# Patient Record
Sex: Female | Born: 1970
Health system: Southern US, Community
[De-identification: ages and names within clinical notes are randomized; demographics above are authoritative.]

## PROBLEM LIST (undated history)

## (undated) DIAGNOSIS — IMO0002 Reserved for concepts with insufficient information to code with codable children: Secondary | ICD-10-CM

## (undated) DIAGNOSIS — M25571 Pain in right ankle and joints of right foot: Secondary | ICD-10-CM

## (undated) DIAGNOSIS — E559 Vitamin D deficiency, unspecified: Secondary | ICD-10-CM

## (undated) DIAGNOSIS — J45909 Unspecified asthma, uncomplicated: Secondary | ICD-10-CM

## (undated) DIAGNOSIS — E538 Deficiency of other specified B group vitamins: Secondary | ICD-10-CM

## (undated) DIAGNOSIS — R7303 Prediabetes: Secondary | ICD-10-CM

## (undated) DIAGNOSIS — R5383 Other fatigue: Secondary | ICD-10-CM

## (undated) DIAGNOSIS — R51 Headache: Secondary | ICD-10-CM

## (undated) DIAGNOSIS — E739 Lactose intolerance, unspecified: Secondary | ICD-10-CM

## (undated) DIAGNOSIS — J329 Chronic sinusitis, unspecified: Secondary | ICD-10-CM

## (undated) DIAGNOSIS — R0602 Shortness of breath: Secondary | ICD-10-CM

## (undated) DIAGNOSIS — K219 Gastro-esophageal reflux disease without esophagitis: Secondary | ICD-10-CM

## (undated) DIAGNOSIS — M6281 Muscle weakness (generalized): Secondary | ICD-10-CM

## (undated) DIAGNOSIS — E663 Overweight: Secondary | ICD-10-CM

## (undated) DIAGNOSIS — R600 Localized edema: Secondary | ICD-10-CM

## (undated) DIAGNOSIS — K3184 Gastroparesis: Secondary | ICD-10-CM

## (undated) DIAGNOSIS — H04123 Dry eye syndrome of bilateral lacrimal glands: Secondary | ICD-10-CM

## (undated) DIAGNOSIS — K5900 Constipation, unspecified: Secondary | ICD-10-CM

## (undated) DIAGNOSIS — R0989 Other specified symptoms and signs involving the circulatory and respiratory systems: Secondary | ICD-10-CM

## (undated) DIAGNOSIS — K760 Fatty (change of) liver, not elsewhere classified: Secondary | ICD-10-CM

## (undated) DIAGNOSIS — Z8711 Personal history of peptic ulcer disease: Secondary | ICD-10-CM

## (undated) DIAGNOSIS — L853 Xerosis cutis: Secondary | ICD-10-CM

## (undated) DIAGNOSIS — G43909 Migraine, unspecified, not intractable, without status migrainosus: Secondary | ICD-10-CM

## (undated) DIAGNOSIS — Z8719 Personal history of other diseases of the digestive system: Secondary | ICD-10-CM

## (undated) DIAGNOSIS — G43709 Chronic migraine without aura, not intractable, without status migrainosus: Secondary | ICD-10-CM

## (undated) DIAGNOSIS — M26629 Arthralgia of temporomandibular joint, unspecified side: Secondary | ICD-10-CM

## (undated) DIAGNOSIS — R12 Heartburn: Secondary | ICD-10-CM

## (undated) HISTORY — DX: Shortness of breath: R06.02

## (undated) HISTORY — PX: COLONOSCOPY: SHX174

## (undated) HISTORY — DX: Reserved for concepts with insufficient information to code with codable children: IMO0002

## (undated) HISTORY — DX: Migraine, unspecified, not intractable, without status migrainosus: G43.909

## (undated) HISTORY — DX: Chronic migraine without aura, not intractable, without status migrainosus: G43.709

## (undated) HISTORY — PX: OTHER SURGICAL HISTORY: SHX169

## (undated) HISTORY — DX: Gastro-esophageal reflux disease without esophagitis: K21.9

## (undated) HISTORY — DX: Constipation, unspecified: K59.00

## (undated) HISTORY — PX: UPPER GASTROINTESTINAL ENDOSCOPY: SHX188

## (undated) HISTORY — DX: Headache: R51

## (undated) HISTORY — DX: Personal history of peptic ulcer disease: Z87.11

## (undated) HISTORY — DX: Xerosis cutis: L85.3

## (undated) HISTORY — DX: Other specified symptoms and signs involving the circulatory and respiratory systems: R09.89

## (undated) HISTORY — DX: Personal history of other diseases of the digestive system: Z87.19

## (undated) HISTORY — DX: Deficiency of other specified B group vitamins: E53.8

## (undated) HISTORY — DX: Overweight: E66.3

## (undated) HISTORY — DX: Fatty (change of) liver, not elsewhere classified: K76.0

## (undated) HISTORY — DX: Arthralgia of temporomandibular joint, unspecified side: M26.629

## (undated) HISTORY — DX: Prediabetes: R73.03

## (undated) HISTORY — DX: Lactose intolerance, unspecified: E73.9

## (undated) HISTORY — DX: Other fatigue: R53.83

## (undated) HISTORY — DX: Chronic sinusitis, unspecified: J32.9

## (undated) HISTORY — DX: Unspecified asthma, uncomplicated: J45.909

## (undated) HISTORY — DX: Dry eye syndrome of bilateral lacrimal glands: H04.123

## (undated) HISTORY — DX: Heartburn: R12

## (undated) HISTORY — DX: Vitamin D deficiency, unspecified: E55.9

## (undated) HISTORY — PX: BUNIONECTOMY: SHX129

## (undated) HISTORY — PX: LAPAROSCOPIC PARTIAL HEPATECTOMY: SHX5909

---

## 1998-02-15 ENCOUNTER — Inpatient Hospital Stay (HOSPITAL_COMMUNITY): Admission: AD | Admit: 1998-02-15 | Discharge: 1998-02-15 | Payer: Self-pay | Admitting: Obstetrics & Gynecology

## 1999-03-11 ENCOUNTER — Encounter: Payer: Self-pay | Admitting: Family Medicine

## 1999-03-11 ENCOUNTER — Ambulatory Visit (HOSPITAL_COMMUNITY): Admission: RE | Admit: 1999-03-11 | Discharge: 1999-03-11 | Payer: Self-pay | Admitting: Family Medicine

## 1999-03-17 ENCOUNTER — Encounter: Admission: RE | Admit: 1999-03-17 | Discharge: 1999-03-17 | Payer: Self-pay | Admitting: Family Medicine

## 1999-08-15 ENCOUNTER — Encounter: Payer: Self-pay | Admitting: Family Medicine

## 1999-08-15 ENCOUNTER — Encounter: Admission: RE | Admit: 1999-08-15 | Discharge: 1999-08-15 | Payer: Self-pay | Admitting: Family Medicine

## 2000-03-07 ENCOUNTER — Inpatient Hospital Stay (HOSPITAL_COMMUNITY): Admission: AD | Admit: 2000-03-07 | Discharge: 2000-03-07 | Payer: Self-pay | Admitting: Obstetrics

## 2000-06-27 ENCOUNTER — Inpatient Hospital Stay (HOSPITAL_COMMUNITY): Admission: AD | Admit: 2000-06-27 | Discharge: 2000-06-27 | Payer: Self-pay | Admitting: *Deleted

## 2000-09-26 ENCOUNTER — Inpatient Hospital Stay (HOSPITAL_COMMUNITY): Admission: AD | Admit: 2000-09-26 | Discharge: 2000-09-26 | Payer: Self-pay | Admitting: Obstetrics

## 2000-09-27 ENCOUNTER — Encounter: Payer: Self-pay | Admitting: Obstetrics

## 2002-03-25 ENCOUNTER — Inpatient Hospital Stay (HOSPITAL_COMMUNITY): Admission: AD | Admit: 2002-03-25 | Discharge: 2002-03-25 | Payer: Self-pay | Admitting: *Deleted

## 2002-03-26 ENCOUNTER — Encounter: Payer: Self-pay | Admitting: *Deleted

## 2002-05-12 ENCOUNTER — Other Ambulatory Visit: Admission: RE | Admit: 2002-05-12 | Discharge: 2002-05-12 | Payer: Self-pay | Admitting: Obstetrics and Gynecology

## 2002-05-16 ENCOUNTER — Encounter: Payer: Self-pay | Admitting: Obstetrics and Gynecology

## 2002-05-17 ENCOUNTER — Encounter: Payer: Self-pay | Admitting: Obstetrics and Gynecology

## 2002-05-17 ENCOUNTER — Inpatient Hospital Stay (HOSPITAL_COMMUNITY): Admission: AD | Admit: 2002-05-17 | Discharge: 2002-05-19 | Payer: Self-pay | Admitting: Obstetrics and Gynecology

## 2002-05-21 ENCOUNTER — Encounter (INDEPENDENT_AMBULATORY_CARE_PROVIDER_SITE_OTHER): Payer: Self-pay | Admitting: Specialist

## 2002-05-21 ENCOUNTER — Inpatient Hospital Stay (HOSPITAL_COMMUNITY): Admission: RE | Admit: 2002-05-21 | Discharge: 2002-05-23 | Payer: Self-pay | Admitting: Obstetrics and Gynecology

## 2002-06-02 ENCOUNTER — Encounter: Payer: Self-pay | Admitting: Obstetrics and Gynecology

## 2002-06-03 ENCOUNTER — Inpatient Hospital Stay (HOSPITAL_COMMUNITY): Admission: AD | Admit: 2002-06-03 | Discharge: 2002-06-04 | Payer: Self-pay | Admitting: Obstetrics and Gynecology

## 2002-12-23 ENCOUNTER — Encounter: Payer: Self-pay | Admitting: Obstetrics and Gynecology

## 2002-12-23 ENCOUNTER — Ambulatory Visit (HOSPITAL_COMMUNITY): Admission: RE | Admit: 2002-12-23 | Discharge: 2002-12-23 | Payer: Self-pay | Admitting: Obstetrics and Gynecology

## 2003-11-11 ENCOUNTER — Encounter: Admission: RE | Admit: 2003-11-11 | Discharge: 2003-11-11 | Payer: Self-pay | Admitting: Internal Medicine

## 2003-11-26 ENCOUNTER — Encounter: Admission: RE | Admit: 2003-11-26 | Discharge: 2003-11-26 | Payer: Self-pay | Admitting: Family Medicine

## 2003-12-29 ENCOUNTER — Other Ambulatory Visit: Admission: RE | Admit: 2003-12-29 | Discharge: 2003-12-29 | Payer: Self-pay | Admitting: Obstetrics and Gynecology

## 2004-04-22 ENCOUNTER — Ambulatory Visit: Payer: Self-pay | Admitting: Family Medicine

## 2004-06-05 HISTORY — PX: BREAST ENHANCEMENT SURGERY: SHX7

## 2004-06-05 HISTORY — PX: AUGMENTATION MAMMAPLASTY: SUR837

## 2004-06-29 ENCOUNTER — Ambulatory Visit: Payer: Self-pay | Admitting: Family Medicine

## 2004-07-15 ENCOUNTER — Ambulatory Visit: Payer: Self-pay | Admitting: Family Medicine

## 2004-08-08 ENCOUNTER — Ambulatory Visit: Payer: Self-pay | Admitting: Family Medicine

## 2004-12-01 ENCOUNTER — Ambulatory Visit: Payer: Self-pay | Admitting: Internal Medicine

## 2005-01-04 ENCOUNTER — Other Ambulatory Visit: Admission: RE | Admit: 2005-01-04 | Discharge: 2005-01-04 | Payer: Self-pay | Admitting: Obstetrics and Gynecology

## 2005-01-05 ENCOUNTER — Ambulatory Visit: Payer: Self-pay | Admitting: Internal Medicine

## 2005-01-06 ENCOUNTER — Ambulatory Visit: Payer: Self-pay | Admitting: Cardiology

## 2005-02-24 ENCOUNTER — Ambulatory Visit: Payer: Self-pay | Admitting: Family Medicine

## 2005-05-09 ENCOUNTER — Ambulatory Visit: Payer: Self-pay | Admitting: Internal Medicine

## 2005-06-12 ENCOUNTER — Encounter: Admission: RE | Admit: 2005-06-12 | Discharge: 2005-06-12 | Payer: Self-pay | Admitting: Internal Medicine

## 2005-08-02 ENCOUNTER — Ambulatory Visit: Payer: Self-pay | Admitting: Internal Medicine

## 2006-01-24 ENCOUNTER — Ambulatory Visit: Payer: Self-pay | Admitting: Internal Medicine

## 2006-02-02 ENCOUNTER — Ambulatory Visit: Payer: Self-pay | Admitting: Internal Medicine

## 2006-03-06 ENCOUNTER — Ambulatory Visit: Payer: Self-pay | Admitting: Internal Medicine

## 2006-04-11 ENCOUNTER — Ambulatory Visit: Payer: Self-pay | Admitting: Family Medicine

## 2006-04-12 ENCOUNTER — Ambulatory Visit: Payer: Self-pay | Admitting: Family Medicine

## 2006-05-11 ENCOUNTER — Ambulatory Visit: Payer: Self-pay | Admitting: Internal Medicine

## 2006-06-21 ENCOUNTER — Encounter: Admission: RE | Admit: 2006-06-21 | Discharge: 2006-06-21 | Payer: Self-pay | Admitting: Internal Medicine

## 2006-10-03 ENCOUNTER — Ambulatory Visit: Payer: Self-pay | Admitting: Internal Medicine

## 2007-01-02 ENCOUNTER — Encounter: Payer: Self-pay | Admitting: Family Medicine

## 2007-02-12 ENCOUNTER — Ambulatory Visit: Payer: Self-pay | Admitting: Family Medicine

## 2007-04-17 ENCOUNTER — Ambulatory Visit: Payer: Self-pay | Admitting: Family Medicine

## 2007-05-14 ENCOUNTER — Ambulatory Visit: Payer: Self-pay | Admitting: Internal Medicine

## 2007-06-04 ENCOUNTER — Telehealth (INDEPENDENT_AMBULATORY_CARE_PROVIDER_SITE_OTHER): Payer: Self-pay | Admitting: *Deleted

## 2007-06-07 ENCOUNTER — Ambulatory Visit: Payer: Self-pay | Admitting: Internal Medicine

## 2007-07-08 ENCOUNTER — Encounter: Payer: Self-pay | Admitting: Internal Medicine

## 2007-07-08 ENCOUNTER — Other Ambulatory Visit: Admission: RE | Admit: 2007-07-08 | Discharge: 2007-07-08 | Payer: Self-pay | Admitting: Internal Medicine

## 2007-07-08 ENCOUNTER — Ambulatory Visit: Payer: Self-pay | Admitting: Internal Medicine

## 2007-07-08 LAB — CONVERTED CEMR LAB
Glucose, Urine, Semiquant: NEGATIVE
Nitrite: NEGATIVE
Urobilinogen, UA: NEGATIVE
pH: 6

## 2007-07-09 ENCOUNTER — Encounter: Payer: Self-pay | Admitting: Internal Medicine

## 2007-07-11 LAB — CONVERTED CEMR LAB
AST: 23 units/L (ref 0–37)
BUN: 17 mg/dL (ref 6–23)
Basophils Absolute: 0 10*3/uL (ref 0.0–0.1)
Creatinine, Ser: 0.9 mg/dL (ref 0.4–1.2)
Eosinophils Absolute: 0.1 10*3/uL (ref 0.0–0.6)
GFR calc Af Amer: 90 mL/min
GFR calc non Af Amer: 74 mL/min
Glucose, Bld: 86 mg/dL (ref 70–99)
HCT: 37.2 % (ref 36.0–46.0)
Hemoglobin: 12.2 g/dL (ref 12.0–15.0)
Lymphocytes Relative: 33.3 % (ref 12.0–46.0)
MCV: 90.6 fL (ref 78.0–100.0)
Monocytes Relative: 5.3 % (ref 3.0–11.0)
Potassium: 4.1 meq/L (ref 3.5–5.1)
RBC: 4.1 M/uL (ref 3.87–5.11)
RDW: 12.5 % (ref 11.5–14.6)
TSH: 0.79 microintl units/mL (ref 0.35–5.50)
WBC: 6.1 10*3/uL (ref 4.5–10.5)

## 2007-07-15 ENCOUNTER — Encounter (INDEPENDENT_AMBULATORY_CARE_PROVIDER_SITE_OTHER): Payer: Self-pay | Admitting: *Deleted

## 2007-12-09 ENCOUNTER — Encounter (INDEPENDENT_AMBULATORY_CARE_PROVIDER_SITE_OTHER): Payer: Self-pay | Admitting: *Deleted

## 2007-12-16 ENCOUNTER — Ambulatory Visit: Payer: Self-pay | Admitting: Internal Medicine

## 2007-12-16 DIAGNOSIS — R519 Headache, unspecified: Secondary | ICD-10-CM | POA: Insufficient documentation

## 2007-12-16 DIAGNOSIS — R51 Headache: Secondary | ICD-10-CM | POA: Insufficient documentation

## 2007-12-16 DIAGNOSIS — M26629 Arthralgia of temporomandibular joint, unspecified side: Secondary | ICD-10-CM | POA: Insufficient documentation

## 2008-08-31 ENCOUNTER — Ambulatory Visit: Payer: Self-pay | Admitting: Family Medicine

## 2008-09-11 ENCOUNTER — Telehealth (INDEPENDENT_AMBULATORY_CARE_PROVIDER_SITE_OTHER): Payer: Self-pay | Admitting: *Deleted

## 2008-09-21 ENCOUNTER — Telehealth (INDEPENDENT_AMBULATORY_CARE_PROVIDER_SITE_OTHER): Payer: Self-pay | Admitting: *Deleted

## 2008-11-23 ENCOUNTER — Encounter: Admission: RE | Admit: 2008-11-23 | Discharge: 2008-11-23 | Payer: Self-pay | Admitting: Obstetrics and Gynecology

## 2009-06-18 ENCOUNTER — Telehealth (INDEPENDENT_AMBULATORY_CARE_PROVIDER_SITE_OTHER): Payer: Self-pay | Admitting: *Deleted

## 2009-06-25 ENCOUNTER — Encounter (INDEPENDENT_AMBULATORY_CARE_PROVIDER_SITE_OTHER): Payer: Self-pay | Admitting: *Deleted

## 2009-06-25 ENCOUNTER — Ambulatory Visit: Payer: Self-pay | Admitting: Internal Medicine

## 2009-06-26 ENCOUNTER — Emergency Department (HOSPITAL_BASED_OUTPATIENT_CLINIC_OR_DEPARTMENT_OTHER): Admission: EM | Admit: 2009-06-26 | Discharge: 2009-06-26 | Payer: Self-pay | Admitting: Emergency Medicine

## 2009-06-26 ENCOUNTER — Ambulatory Visit: Payer: Self-pay | Admitting: Diagnostic Radiology

## 2009-07-14 ENCOUNTER — Ambulatory Visit (HOSPITAL_BASED_OUTPATIENT_CLINIC_OR_DEPARTMENT_OTHER): Admission: RE | Admit: 2009-07-14 | Discharge: 2009-07-14 | Payer: Self-pay | Admitting: Orthopedic Surgery

## 2009-07-14 ENCOUNTER — Ambulatory Visit: Payer: Self-pay | Admitting: Diagnostic Radiology

## 2009-08-10 ENCOUNTER — Encounter: Payer: Self-pay | Admitting: Internal Medicine

## 2009-08-10 ENCOUNTER — Encounter: Admission: RE | Admit: 2009-08-10 | Discharge: 2009-08-31 | Payer: Self-pay | Admitting: Orthopedic Surgery

## 2009-08-16 ENCOUNTER — Ambulatory Visit: Payer: Self-pay | Admitting: Internal Medicine

## 2009-10-25 ENCOUNTER — Ambulatory Visit: Payer: Self-pay | Admitting: Family Medicine

## 2009-11-04 ENCOUNTER — Telehealth (INDEPENDENT_AMBULATORY_CARE_PROVIDER_SITE_OTHER): Payer: Self-pay | Admitting: *Deleted

## 2009-11-09 ENCOUNTER — Ambulatory Visit: Payer: Self-pay | Admitting: Family Medicine

## 2009-12-09 ENCOUNTER — Encounter: Admission: RE | Admit: 2009-12-09 | Discharge: 2009-12-09 | Payer: Self-pay | Admitting: Obstetrics and Gynecology

## 2009-12-28 ENCOUNTER — Encounter: Admission: RE | Admit: 2009-12-28 | Discharge: 2010-02-09 | Payer: Self-pay | Admitting: Orthopedic Surgery

## 2010-01-12 ENCOUNTER — Encounter: Admission: RE | Admit: 2010-01-12 | Discharge: 2010-01-12 | Payer: Self-pay | Admitting: Obstetrics and Gynecology

## 2010-03-14 ENCOUNTER — Ambulatory Visit: Payer: Self-pay | Admitting: Internal Medicine

## 2010-03-18 ENCOUNTER — Ambulatory Visit: Payer: Self-pay | Admitting: Cardiology

## 2010-06-26 ENCOUNTER — Encounter: Payer: Self-pay | Admitting: Obstetrics and Gynecology

## 2010-07-05 NOTE — Assessment & Plan Note (Signed)
Summary: sore throat./cbs   Vital Signs:  Patient profile:   40 year old female Weight:      150 pounds Temp:     98.9 degrees F oral BP sitting:   110 / 72  (left arm)  Vitals Entered By: [name] (Oct 25, 2009 1:46 PM) CC: sore throat along w/ nausea and HA   History of Present Illness: 40 yo woman here today for sore throat.  sxs started w/ nausea 3-4 days ago.  sore throat for last 48 hrs.  also having facial pressure and R ear pain.  taking Zyrtec w/out relief.  temp to 100.  + sick contacts.  cough started last night- 'like a dog', barking.  + nasal congestion.  Allergies (verified): 1)  ! Tylox  Review of Systems      See HPI  Physical Exam  General:  alert and well-developed.   Head:  Normocephalic and atraumatic without obvious abnormalities. No apparent alopecia or balding.  mild TTP over R frontal and maxillary sinus Eyes:  no injxn or inflammation Ears:  R TM retracted, R TM WNL Nose:  marked turbinate edema Mouth:  + post nasal drip Neck:  No deformities, masses, or tenderness noted. Lungs:  normal respiratory effort, no intercostal retractions, no accessory muscle use, and normal breath sounds.  + dry cough Heart:  normal rate, regular rhythm, and no murmur.     Impression & Recommendations:  Problem # 1:  SINUSITIS- ACUTE-NOS (ICD-461.9) Assessment Unchanged start amox.  reviewed supportive care and red flags that should prompt return.  Pt expresses understanding and is in agreement w/ this plan. The following medications were removed from the medication list:    Fluticasone Propionate 50 Mcg/act Susp (Fluticasone propionate) .Marland Kitchen... 2 sprays each nostril once daily    Guaifenesin Ac 100-10 Mg/64ml Syrp (Guaifenesin-codeine) .Marland Kitchen... 1-2 tsps q4-6 as needed for cough.  disp Her updated medication list for this problem includes:    Amoxicillin 500 Mg Tabs (Amoxicillin) .Marland Kitchen... 2 tabs by mouth two times a day x10 days  Complete Medication List: 1)   Nuvaring 0.12-0.015 Mg/24hr Ring (Etonogestrel-ethinyl estradiol) .... Qd 2)  Amoxicillin 500 Mg Tabs (Amoxicillin) .... 2 tabs by mouth two times a day x10 days 3)  Diflucan 150 Mg Tabs (Fluconazole) .... Once daily for yeast.  repeat in 72 hours or at end of abx if symptoms persist  Patient Instructions: 1)  This appears to be a sinus infection- take the amoxicillin as directed 2)  Use the diflucan for yeast as needed 3)  Continue your Zyrtec for the allergy component 4)  Tylenol/Ibuprofen as needed for pain or fever 5)  Call with any questions or concerns 6)  Hang in there! Prescriptions: DIFLUCAN 150 MG TABS (FLUCONAZOLE) once daily for yeast.  repeat in 72 hours or at end of abx if symptoms persist  #2 x 0   Entered and Authorized by:   Neena Rhymes MD   Signed by:   Neena Rhymes MD on 10/25/2009   Method used:   Electronically to        [facility] #[id number]* (retail)       [address] [place], [place]  [address]       Ph: [phone]       Fax: [phone]   RxID:   [id number] AMOXICILLIN 500 MG TABS (AMOXICILLIN) 2 tabs by mouth two times a day x10 days  #40 x 0  Entered and Authorized by:   Neena Rhymes MD   Signed by:   Neena Rhymes MD on 10/25/2009   Method used:   Electronically to        [facility] #[id number]* (retail)       [address] [place], [place]  [address]       Ph: [phone]       Fax: [phone]   RxID:   [id number]

## 2010-07-05 NOTE — Assessment & Plan Note (Signed)
Summary: sore throat,cough/cbs   Vital Signs:  Patient profile:   40 year old female Weight:      150.25 pounds Temp:     98.9 degrees F oral Pulse rate:   64 / minute BP sitting:   100 / 68  Vitals Entered By: Kandice Hams (November 09, 2009 11:51 AM) CC: c/o sore throat, sinus congestion right  ear stopped up, cough brown phlegm   History of Present Illness: 40 yo woman here today w/ cough and congestion.  pt recently found out there is mold in the house.  completed course of amox but feels worse.  feels pressure in frontal sinuses, R ear pain, cough is productive of brown phlegm.  'i've never taken antibiotics and not gotten better'.  Allergies (verified): 1)  ! Tylox  Review of Systems      See HPI  Physical Exam  General:  alert and well-developed.   Head:  Normocephalic and atraumatic without obvious abnormalities. No apparent alopecia or balding.  TTP over frontal sinuses Eyes:  no injxn or inflammation Ears:  R TM retracted, R TM WNL Nose:  marked turbinate edema Mouth:  + post nasal drip Neck:  No deformities, masses, or tenderness noted. Lungs:  normal respiratory effort, no intercostal retractions, no accessory muscle use, and normal breath sounds.  + dry cough Heart:  normal rate, regular rhythm, and no murmur.   Cervical Nodes:  No lymphadenopathy noted   Impression & Recommendations:  Problem # 1:  SINUSITIS- ACUTE-NOS (ICD-461.9) Assessment Unchanged pt did not improve w/ course of amox.  start Avelox.  discussed importance of removing herself from mold exposure which is likely trigger.  reviewed supportive care and red flags that should prompt return.  Pt expresses understanding and is in agreement w/ this plan. Her updated medication list for this problem includes:    Avelox 400 Mg Tabs (Moxifloxacin hcl) .Marland Kitchen... 1 tablet by mouth daily    Nasonex 50 Mcg/act Susp (Mometasone furoate) .Marland Kitchen... 2 sprays each nostril once daily  Complete Medication List: 1)   Nuvaring 0.12-0.015 Mg/24hr Ring (Etonogestrel-ethinyl estradiol) .... Qd 2)  Diflucan 150 Mg Tabs (Fluconazole) .... Once daily for yeast.  repeat in 72 hours or at end of abx if symptoms persist 3)  Avelox 400 Mg Tabs (Moxifloxacin hcl) .Marland Kitchen.. 1 tablet by mouth daily 4)  Nasonex 50 Mcg/act Susp (Mometasone furoate) .... 2 sprays each nostril once daily  Patient Instructions: 1)  Take the Avelox as directed- take w/ food to avoid upset stomach 2)  Use the Nasonex daily to decrease congestion and inflammation 3)  Mucinex (over the counter) to thin your chest and nasal congestion- making it easier to cough up 4)  Drink plenty of fluids 5)  Call with any questions or concerns 6)  Hang in there!! Prescriptions: NASONEX 50 MCG/ACT SUSP (MOMETASONE FUROATE) 2 sprays each nostril once daily  #1 x 3   Entered and Authorized by:   Neena Rhymes MD   Signed by:   Neena Rhymes MD on 11/09/2009   Method used:   Electronically to        [id number]* (retail)       [address] [place], [place]  [address]       Ph: [phone]       Fax: [phone]   RxID:   [id number] AVELOX 400 MG  TABS (MOXIFLOXACIN HCL) 1 tablet by mouth daily  #10 x  0   Entered and Authorized by:   Neena Rhymes MD   Signed by:   Neena Rhymes MD on 11/09/2009   Method used:   Electronically to        [id number]* (retail)       [address] [place], [place]  [address]       Ph: [phone]       Fax: [phone]   RxID:   [id number]

## 2010-07-05 NOTE — Letter (Signed)
Summary: [facility]  [facility]   Imported By: Lanelle Bal 09/10/2009 10:44:44  _____________________________________________________________________  External Attachment:    Type:   Image     Comment:   External Document

## 2010-07-05 NOTE — Letter (Signed)
Summary: [place] No Show Letter  [facility] [place]  [address] [place], [place] [address]   Phone: [phone]  Fax: [phone]    06/25/2009 MRN: [id number]  [facility] [address] [place], [place]  [address]   Dear Ms. Fiallos,   Our records indicate that you missed your scheduled appointment with Dr. Drue Novel on 06/25/09.  Please contact this office to reschedule your appointment as soon as possible.  It is important that you keep your scheduled appointments with your physician, so we can provide you the best care possible.  Please be advised that there may be a charge for "no show" appointments.    Sincerely,   [place] [facility]

## 2010-07-05 NOTE — Progress Notes (Signed)
Summary: cannot leave msg mailbox full//left msg to call 3x  Phone Note Call from Patient Call back at Home Phone [phone]   Caller: Patient Summary of Call: pt called and left msg for a call back has a question about bacteria. Called pt back got VM pt cannot accept messages at this time mailbox is full try later .Kandice Hams  June 18, 2009 3:17 PM   Follow-up for Phone Call        Called pt got VM left msg for pt to call .Kandice Hams  June 21, 2009 11:03 AM  Follow-up by: Kandice Hams,  June 21, 2009 11:03 AM  Additional Follow-up for Phone Call Additional follow up Details #1::        pt called again. please return call. Called pt got VM left msg to call .Kandice Hams  June 30, 2009 11:18 AM  Additional Follow-up by: Warnell Forester,  June 21, 2009 1:14 PM    Additional Follow-up for Phone Call Additional follow up Details #2::    Called pt again today no answer chart back to file .Kandice Hams  July 06, 2009 10:48 AM  Follow-up by: Kandice Hams,  July 06, 2009 10:48 AM

## 2010-07-05 NOTE — Assessment & Plan Note (Signed)
Summary: FOLLOWUP FROM SINUS INF--NOT ANT BETTER///SPH   Vital Signs:  Patient profile:   40 year old female Weight:      160.50 pounds Temp:     98.9 degrees F Pulse rate:   87 / minute Pulse rhythm:   regular BP sitting:   122 / 80  (left arm) Cuff size:   large  Vitals Entered By: Army Fossa CMA (March 14, 2010 11:51 AM) CC: Pt here c/o sinus infection- exposed to mole.  Comments Has been on Avelox.    History of Present Illness: she was seen 5/11 and the 6-11 with sinus symptoms Status post amoxicillin and Avelox Symptoms initially improve but never went away completely Currently having frontal headaches, nasal congestion, occasional dizziness, some nausea. She moved out of a  house with mold 2 months ago yet symptoms persist she's not taking Nasonex as prescribed, she takes Zyrtec and Mucinex as needed which actually helps  ROS Dry cough, mild Occasional wheezing She does have postnasal dripping, itchy eyes and itchy nose. No fever Some ear discomfort   Current Medications (verified): 1)  Nuvaring 0.12-0.015 Mg/24hr  Ring (Etonogestrel-Ethinyl Estradiol) .... Qd 2)  Nasonex 50 Mcg/act Susp (Mometasone Furoate) .... 2 Sprays Each Nostril Once Daily  Allergies (verified): 1)  ! Tylox  Past History:  Past Medical History: Reviewed history from 12/16/2007 and no changes required. h/o endometriosis h/o occ HAs Occ TMJ pain Headache--Dx w/mugraines before, topamax helps  Past Surgical History: Reviewed history from 07/08/2007 and no changes required. Left fallopian tube removed Breast implant (aprox 2006)  Social History: Single household-- sister and 3 kids  no children  no pets   Physical Exam  General:  alert, well-developed, and well-nourished.   Head:  face symmetric, slightly tender at the frontal sinuses bilaterally Eyes:  no redness or discharge Ears:  R ear normal and L ear normal.   Nose:  slightly congested Mouth:  no redness, no  discharge, tonsils normal in size Lungs:  normal respiratory effort, no intercostal retractions, no accessory muscle use, and normal breath sounds.     Impression & Recommendations:  Problem # 1:  RHINITIS (ICD-477.9) persistent rhinitis symptoms despite moving away from a house with mold 2 months ago Plan: CT sinuses Zyrtec and Nasonex daily. Mucinex as needed Reassess and return to the office, patient knows to call me sooner if symptoms do not improve. She will need an allergist or ENT referral  (although states that she was seen by an allergist 2   years ago approximately) Her updated medication list for this problem includes:    Nasonex 50 Mcg/act Susp (Mometasone furoate) .Marland Kitchen... 2 sprays each nostril once daily    Zyrtec Allergy 10 Mg Caps (Cetirizine hcl) .Marland Kitchen... 1 a day  Complete Medication List: 1)  Nuvaring 0.12-0.015 Mg/24hr Ring (Etonogestrel-ethinyl estradiol) .... Qd 2)  Nasonex 50 Mcg/act Susp (Mometasone furoate) .... 2 sprays each nostril once daily 3)  Zyrtec Allergy 10 Mg Caps (Cetirizine hcl) .Marland Kitchen.. 1 a day  Other Orders: Radiology Referral (Radiology)  Patient Instructions: 1)  continue zyrtec daily, not as needed  2)  start nasonex 2 sprays on each side of the nose daily (x months) 3)  mucinex as needed 4)  call if no better in 3 weeks  5)  Please schedule a follow-up appointment in 3 months .  Prescriptions: NASONEX 50 MCG/ACT SUSP (MOMETASONE FUROATE) 2 sprays each nostril once daily  #1 x 6   Entered and Authorized by:  Custer Pimenta E. Cliffie Gingras MD   Signed by:   Nolon Rod. Kaimana Neuzil MD on 03/14/2010   Method used:   Print then Give to Patient   RxID:   [id number]

## 2010-07-05 NOTE — Progress Notes (Signed)
Summary: RE SINUS INF; MOLD IN HOUSE  Phone Note Call from Patient Call back at [phone]   Caller: Patient Summary of Call: pt has mold in her house, 3 other people has sinus infections also.  PT WANTS TO KNOW CAN SHE HAVE DOCUMENTATION OF HER SINUS INFECTION,   SEEN 10/26/09 INS CO FOR THE HOUSE SHE IS LEASING HAS GIVEN LANDLORD MONEY TO FIX AND HE HAS NOT DONE IT YET.  Initial call taken by: Kandice Hams,  November 04, 2009 3:08 PM  Follow-up for Phone Call        ok to print copy of pt's note Follow-up by: Neena Rhymes MD,  November 04, 2009 3:10 PM  Additional Follow-up for Phone Call Additional follow up Details #1::        OV NOTE PRINTED PT INFORMED UP FRONT FOR PICKUP .Kandice Hams  November 04, 2009 3:14 PM  Additional Follow-up by: Kandice Hams,  November 04, 2009 3:15 PM

## 2010-08-02 ENCOUNTER — Ambulatory Visit (INDEPENDENT_AMBULATORY_CARE_PROVIDER_SITE_OTHER): Payer: BC Managed Care – PPO | Admitting: Internal Medicine

## 2010-08-02 ENCOUNTER — Encounter: Payer: Self-pay | Admitting: Internal Medicine

## 2010-08-02 DIAGNOSIS — J209 Acute bronchitis, unspecified: Secondary | ICD-10-CM

## 2010-08-02 DIAGNOSIS — J019 Acute sinusitis, unspecified: Secondary | ICD-10-CM | POA: Insufficient documentation

## 2010-08-11 NOTE — Assessment & Plan Note (Signed)
Summary: congested/cbs   Vital Signs:  Patient profile:   40 year old female Weight:      152 pounds Temp:     98.6 degrees F oral Pulse rate:   84 / minute Resp:     15 per minute BP sitting:   110 / 68  (left arm) Cuff size:   large  Vitals Entered By: Shonna Chock CMA (August 02, 2010 3:35 PM) CC: Sinus Infection and nauseated (due to drainage) x 3-4 days , URI symptoms   CC:  Sinus Infection and nauseated (due to drainage) x 3-4 days  and URI symptoms.  History of Present Illness:    Onset as head congestion followed by ST from [place] 4 days ago. She now  reports scant  purulent nasal discharge and productive cough, but denies earache.  The patient denies fever, dyspnea, and wheezing.  The patient also reports frontal headache, bilateral facial pain and tooth pain.  The patient denies the following risk factors for Strep sinusitis: tender adenopathy.  Rx: Zyrtec , Mucinex D  Current Medications (verified): 1)  Nuvaring 0.12-0.015 Mg/24hr  Ring (Etonogestrel-Ethinyl Estradiol) .... Qd 2)  Nasonex 50 Mcg/act Susp (Mometasone Furoate) .... 2 Sprays Each Nostril Once Daily 3)  Zyrtec Allergy 10 Mg Caps (Cetirizine Hcl) .Marland Kitchen.. 1 A Day  Allergies: 1)  ! Tylox  Physical Exam  General:  well-nourished,in no acute distress; alert,appropriate and cooperative throughout examination Ears:  External ear exam shows no significant lesions or deformities.  Otoscopic examination reveals clear canals, tympanic membranes are intact bilaterally without bulging, retraction, inflammation or discharge. Hearing is grossly normal bilaterally. Nose:  External nasal examination shows no deformity or inflammation. Nasal mucosa are  dry  without lesions or exudates. Mouth:  Oral mucosa and oropharynx without lesions or exudates.  Teeth in good repair. Mild pharyngeal erythema.   Lungs:  Normal respiratory effort, chest expands symmetrically. Lungs are clear to auscultation, no crackles or  wheezes. Heart:  Normal rate and regular rhythm. S1 and S2 normal without gallop, murmur, click, rub .S4 Cervical Nodes:  Shotty lymphadenopathy noted Axillary Nodes:  No palpable lymphadenopathy   Impression & Recommendations:  Problem # 1:  SINUSITIS- ACUTE-NOS (ICD-461.9)  The following medications were removed from the medication list:    Nasonex 50 Mcg/act Susp (Mometasone furoate) .Marland Kitchen... 2 sprays each nostril once daily Her updated medication list for this problem includes:    Amoxicillin 500 Mg Caps (Amoxicillin) .Marland Kitchen... 1 three times a day (may affect birth control ! )    Fluticasone Propionate 50 Mcg/act Susp (Fluticasone propionate) .Marland Kitchen... 1 spray two times a day as needed  Problem # 2:  BRONCHITIS-ACUTE (ICD-466.0)  Her updated medication list for this problem includes:    Amoxicillin 500 Mg Caps (Amoxicillin) .Marland Kitchen... 1 three times a day (may affect birth control ! )  Complete Medication List: 1)  Nuvaring 0.12-0.015 Mg/24hr Ring (Etonogestrel-ethinyl estradiol) .... Qd 2)  Zyrtec Allergy 10 Mg Caps (Cetirizine hcl) .Marland Kitchen.. 1 a day 3)  Amoxicillin 500 Mg Caps (Amoxicillin) .Marland Kitchen.. 1 three times a day (may affect birth control ! ) 4)  Fluticasone Propionate 50 Mcg/act Susp (Fluticasone propionate) .Marland Kitchen.. 1 spray two times a day as needed 5)  Fluconazole 150 Mg Tabs (Fluconazole) .Marland Kitchen.. 1 once daily as needed  Patient Instructions: 1)  nETRI POT once daily- two times a day as needed . 2)  Drink as much non DAIRY fluid as you can tolerate for the next few days.sTOP DECONGESTANT. Prescriptions: FLUCONAZOLE  150 MG TABS (FLUCONAZOLE) 1 once daily as needed  #1 x 0   Entered and Authorized by:   Marga Melnick MD   Signed by:   Marga Melnick MD on 08/02/2010   Method used:   Print then Give to Patient   RxID:   [id number] FLUTICASONE PROPIONATE 50 MCG/ACT SUSP (FLUTICASONE PROPIONATE) 1 spray two times a day as needed  #1 x 11   Entered and Authorized by:   Marga Melnick MD    Signed by:   Marga Melnick MD on 08/02/2010   Method used:   Electronically to        [facility] #[id number]* (retail)       [address] [place], [place]  [address]       Ph: [phone]       Fax: [phone]   RxID:   [id number] AMOXICILLIN 500 MG CAPS (AMOXICILLIN) 1 three times a day (MAY affect birth control ! )  #30 x 0   Entered and Authorized by:   Marga Melnick MD   Signed by:   Marga Melnick MD on 08/02/2010   Method used:   Electronically to        [facility] #[id number]* (retail)       [address] [place], [place]  [address]       Ph: [phone]       Fax: [phone]   RxID:   [id number]    Orders Added: 1)  Est. Patient Level III [83151]

## 2010-08-23 ENCOUNTER — Encounter: Payer: Self-pay | Admitting: Internal Medicine

## 2010-08-24 ENCOUNTER — Ambulatory Visit: Payer: BC Managed Care – PPO | Admitting: Internal Medicine

## 2010-08-25 ENCOUNTER — Ambulatory Visit (INDEPENDENT_AMBULATORY_CARE_PROVIDER_SITE_OTHER): Payer: BC Managed Care – PPO | Admitting: Internal Medicine

## 2010-08-25 ENCOUNTER — Encounter: Payer: Self-pay | Admitting: Internal Medicine

## 2010-08-25 VITALS — BP 110/64 | HR 64 | Temp 98.2°F | Wt 151.6 lb

## 2010-08-25 DIAGNOSIS — J329 Chronic sinusitis, unspecified: Secondary | ICD-10-CM

## 2010-08-25 DIAGNOSIS — J209 Acute bronchitis, unspecified: Secondary | ICD-10-CM

## 2010-08-25 MED ORDER — CEFUROXIME AXETIL 500 MG PO TABS: 500.0000 mg | ORAL_TABLET | Freq: Two times a day (BID) | ORAL | Status: AC

## 2010-08-25 NOTE — Progress Notes (Signed)
  Subjective:    Patient ID: Morgan Long, female    DOB: May 15, 1971, 40 y.o.   MRN: [id number]  HPI she describes persistent congestion both nasally and in her chest. This is despite using the nasal rinse , fluticasone nasal spray , and Zyrtec.   she denies pain in the ears or discharge from the ears but she has had some pressure symptoms suggesting eustachian tube dysfunction. She has had frontal headache and facial pain. Additionally she's had pain in the left upper posterior teeth. She denies significant sore throat or tender lymphadenopathy.    itchy eyes and sneezing are not significant components.   She does describe purulent secretions both from the nose as well as the chest , more for the chest in reference to volume.    Review of Systems     Objective:   Physical Exam on exam she is in no acute distress.  There is full extraocular motion and no clinical conjunctivitis is suggested. There is mild edema of the nares more so on the right. The oropharynx is unremarkable. She does not have lymphadenopathy about the head neck or axilla. Her chest is surprisingly clear without rhonchi or wheezes.        Assessment & Plan:   #1 rhinosinusitis is suggested      #2 bronchitis with some purulence.   Plan : #1 a broader spectrum antibiotic will be prescribed ; she may have had a resistant organism which the initial metabolic did not eradicate. If symptoms persist then CT of the sinuses would be recommended.

## 2010-08-25 NOTE — Patient Instructions (Signed)
Continue the Neti  pot twice a day as needed for nasal congestion. Use the fluticasone nasal spray every 12 hours as needed for inflammation. Drink as much nondairy fluids as she can for the next several days. Align  is a probiotic which will help  prevent associated superinfections

## 2010-10-01 ENCOUNTER — Ambulatory Visit (INDEPENDENT_AMBULATORY_CARE_PROVIDER_SITE_OTHER): Payer: BC Managed Care – PPO | Admitting: Family Medicine

## 2010-10-01 ENCOUNTER — Encounter: Payer: Self-pay | Admitting: Family Medicine

## 2010-10-01 ENCOUNTER — Telehealth: Payer: Self-pay | Admitting: *Deleted

## 2010-10-01 VITALS — BP 98/64 | HR 86 | Temp 98.8°F | Wt 155.0 lb

## 2010-10-01 DIAGNOSIS — J309 Allergic rhinitis, unspecified: Secondary | ICD-10-CM

## 2010-10-01 MED ORDER — PREDNISONE 20 MG PO TABS: ORAL_TABLET | ORAL | Status: DC

## 2010-10-01 NOTE — Patient Instructions (Signed)
Take pred. As directed   Rt prn

## 2010-10-01 NOTE — Telephone Encounter (Signed)
Updated meds

## 2010-10-01 NOTE — Progress Notes (Signed)
  Subjective:    Patient ID: Morgan Long, female    DOB: Jul 03, 1970, 40 y.o.   MRN: [id number]  Sore Throat  Associated symptoms include coughing.  40 y/o fem nonsmoker runner who has been out in the woods running a lot and has had a flare of her AR unresp. To otc ATH    Review of Systems  Constitutional: Negative.   HENT: Positive for sneezing and postnasal drip.   Eyes: Negative.   Respiratory: Positive for cough.        Objective:   Physical Exam  Constitutional: She appears well-developed and well-nourished. No distress.  HENT:  Head: Normocephalic and atraumatic.  Right Ear: External ear normal.  Left Ear: External ear normal.  Nose: Nose normal.  Mouth/Throat: Oropharynx is clear and moist. No oropharyngeal exudate.  Neck: Normal range of motion.  Pulmonary/Chest: Effort normal and breath sounds normal. No respiratory distress. She has no wheezes. She has no rales. She exhibits no tenderness.  Skin: She is not diaphoretic.          Assessment & Plan:  AR,,pred. B/t

## 2010-10-04 ENCOUNTER — Telehealth: Payer: Self-pay | Admitting: Internal Medicine

## 2010-10-04 NOTE — Telephone Encounter (Signed)
Spoke w/ pt informed that we can't agree or disagree with plan from sat clinic Dr. Informed that if she is no better that she will need to be access by Dr. Drue Novel. Wanted to know if he would just call her in an atb informed that ov will be needed. Pt stated that she didn't want to pay another co-pay recommended she use netti pot which she has been using along w/ mucinex for congestion pt ok'd information.

## 2010-10-04 NOTE — Telephone Encounter (Signed)
Patient called to check with Dr Drue Novel to make sure Dr Drue Novel agrees with treatment plan from Saturday clinic doctor---Doctor on Saturday clinic said patient had allergies and put her on Prednisone--patient is concerned because she is coughing up yellow phlegm and that usually means she has a sinus infection---just wants to know that Dr Drue Novel agrees

## 2010-10-04 NOTE — Telephone Encounter (Signed)
Left msg for pt to return call.

## 2010-10-21 NOTE — Discharge Summary (Signed)
   NAME:  Morgan Long, Morgan Long                         ACCOUNT NO.:  [id number]   MEDICAL RECORD NO.:  [id number]                   PATIENT TYPE:  INP   LOCATION:  9303                                 FACILITY:  [facility]   PHYSICIAN:  Michelle L. Vincente Poli, M.D.            DATE OF BIRTH:  1971/01/18   DATE OF ADMISSION:  05/21/2002  DATE OF DISCHARGE:  05/23/2002                                 DISCHARGE SUMMARY   ADMISSION DIAGNOSIS:  Left cornual pregnancy.   DISCHARGE DIAGNOSIS:  Left cornual pregnancy.   HOSPITAL COURSE:  This 40 year old gravida 1, para 0 with a left cornual  pregnancy.  She was treated with methotrexate five days ago by her quans are  still increasing.  The patient has been admitted for exploratory laparotomy.  The patient on the day of admission underwent exploratory laparotomy.  She  had a resection of the cornual pregnancy as well as a left salpingectomy and  fulguration of endometriosis.  At the time of surgery there was noted to be  endometriosis in the cul-de-sac and a left cornual pregnancy was embedded in  the wall of the uterus.  The patient did very well postoperatively.  She  went home on postoperative day number two.  Her hemoglobin on postoperative  day number one was 11.5 and white blood cell count 7.6.  On postoperative  day number two the patient was ambulating.  She had good pain control with  her medications.  She had flatus.  She tolerated a regular diet.  She was  discharged home with Toradol to take as needed for pain as well as Darvocet-  N 100.  She was to follow up in office in one week.  She was advised no  driving for one week.  She was advised to call if she has a temperature  greater than 100.5, nausea, vomiting, severe abdominal pain, or inability to  _______.                                               Michelle L. Vincente Poli, M.D.    Florestine Avers  D:  09/29/2002  T:  09/29/2002  Job:  [id number]

## 2010-10-21 NOTE — Discharge Summary (Signed)
   NAME:  Morgan Long, Morgan Long                         ACCOUNT NO.:  [id number]   MEDICAL RECORD NO.:  [id number]                   PATIENT TYPE:  INP   LOCATION:  9106                                 FACILITY:  [facility]   PHYSICIAN:  Michelle L. Vincente Poli, M.D.            DATE OF BIRTH:  [birth year]   DATE OF ADMISSION:  06/02/2002  DATE OF DISCHARGE:  06/04/2002                                 DISCHARGE SUMMARY   ADMISSION DIAGNOSES:  1. Postoperative ileus.  2. Abdominal pain.   DISCHARGE DIAGNOSES:  1. Postoperative ileus.  2. Abdominal pain.   HOSPITAL COURSE:  The patient is a 40 year old gravida 2, para 0 who had a  cornual resection for an ectopic pregnancy on December 17.  She presents  complaining of nausea and pain.  She was noted to have a small temperature  of 100.8.  On admission her abdomen was mildly distended and there was some  tenderness of the site over the incision.  Her white blood cell count on  admission was 13.8.  The patient was admitted with diagnosis of ileus  postoperatively.  She was given IV hydration.  She was also given Unasyn and  placed on clear liquids.  Over the next couple of days she was also given  some enemas and she had good results.  By hospital day number three she was  having flatus and she was afebrile with stable vital signs.  A follow-up  white blood cell count on December 30 was 8.0.  Her ileus had resolved and  patient was discharged home in good condition on hospital day number three.  She was given Darvocet-N 100 and Tigan to take as needed for nausea.  She  will follow up in the office on Friday.  The patient was advised to call if  she has any temperature greater than 100.5, nausea, vomiting, or severe  abdominal pain.                                               Michelle L. Vincente Poli, M.D.    Florestine Avers  D:  09/29/2002  T:  09/29/2002  Job:  [id number]

## 2010-10-21 NOTE — Op Note (Signed)
NAME:  Morgan Long, Morgan Long                         ACCOUNT NO.:  [id number]   MEDICAL RECORD NO.:  [id number]                   PATIENT TYPE:  INP   LOCATION:  9303                                 [facility]ACILITY:  [facility]   PHYSICIAN:  Michelle L. Vincente Poli, M.D.            DATE OF BIRTH:  05/31/1971   DATE OF PROCEDURE:  05/21/2002  DATE OF DISCHARGE:                                 OPERATIVE REPORT   PREOPERATIVE DIAGNOSES:  Left cornual pregnancy.   POSTOPERATIVE DIAGNOSES:  1. Left cornual pregnancy.  2. Endometriosis.   PROCEDURE:  1. Exploratory laparotomy.  2. Resection of cornual pregnancy.  3. Left salpingectomy.  4. Fulguration of endometriosis.   SURGEON:  Michelle L. Vincente Poli, M.D.   ASSISTANT:  Dineen Kid. Rana Snare, M.D.   ANESTHESIA:  General.   ESTIMATED BLOOD LOSS:  50 cc.   FINDINGS:  Endometriosis in cul-de-sac, left cornual pregnancy embedded in  wall of uterus.   COMPLICATIONS:  None.   PROCEDURE:  The patient was taken to the operating room.  She was intubated  without difficulty.  The abdomen was prepped and draped and a Foley catheter  was placed in bladder.  Using a scalpel low transverse incision was made,  carried down to the fascia.  The fascia was scored in the midline and  extended laterally.  A Pfannenstiel incision was then developed.  The  peritoneum was then entered bluntly and the peritoneal incision was then  stretched.  The self retaining retractor was placed in the abdominal cavity  and the large and small bowel were packed in the upper abdomen and bladder  blade was inserted.  The uterus was elevated to the incision and inspection  revealed most obviously a left cornual pregnancy.  You could see it through  the serosa of the uterus.  The ovaries appeared normal.  There is some  scattered endometriosis in the cul-de-sac and posterior wall of the uterus.  We then used the scalpel and resected, performed a wedge resection around  the pregnancy.  Products of  conception were easily visualized.  This is then  sent to the pathologist that confirmed that there was villae present and the  uterine cavity cleaned with a curettage from above.  The left tube, however,  appeared very coarse with a cornual resection.  The tubal opening was  definitely interrupted.  At this point we decided to proceed with a left  salpingectomy because of the concern that she may have a recurrence of the  cornual pregnancy if she became pregnant on the left side.  Her right  fallopian tube appeared very normal in appearance.  A left salpingectomy was  performed by placing Kelly clamps across the mesosalpinx.  The tube was  removed and the pedicles were secured using 0 Vicryl suture ligature.  The  incision was closed using 0 Vicryl suture in a running locked stitch and the  serosa was closed  using a 3-0 Vicryl in a baseball stitch.  INTERCEED was  then placed over the incision.  Of note, the patient was counseled prior to  surgery.  We said she would need a cesarean section if she became pregnant  in the future because of the manner of the incision.  The sponges were  removed from the abdomen.  The peritoneum was closed using 0 Vicryl in  continuous running stitch and the rectus muscles were reapproximated using  the same 0 Vicryl.  The fascia was closed using 0 Vicryl in a continuous  running locked stitch.  The skin was closed with staples.  Dexamethasone and  normal saline were injected at the incision to limit  ______ formation.  All  sponge, lap, instrument counts were correct x2.  The patient tolerated  procedure well and went to recovery room in stable condition.                                               Michelle L. Vincente Poli, M.D.    Florestine Avers  D:  05/21/2002  T:  05/22/2002  Job:  [id number]

## 2010-10-21 NOTE — Discharge Summary (Signed)
   NAME:  [name], [name]                         ACCOUNT NO.:  [id number]   MEDICAL RECORD NO.:  [id number]                   PATIENT TYPE:  INP   LOCATION:  9303                                 [facility]ILITY:  [facility]   PHYSICIAN:  Michelle L. Vincente Poli, M.D.            DATE OF BIRTH:  [birth year]   DATE OF ADMISSION:  05/21/2002  DATE OF DISCHARGE:  05/23/2002                                 DISCHARGE SUMMARY   ADMISSION DIAGNOSES:  Left cornual pregnancy status post resection of  cornual pregnancy and left salpingectomy and fulguration of endometriosis  and exploratory laparotomy.   HOSPITAL COURSE:  The patient is a 40 year old female with a left cornual  pregnancy.  She was treated with methotrexate five days ago but her HCGs are  still rising.  HCG today level is approximately 18,000.  The patient was  taken to surgery and at the time of surgery she was found to have a left  cornual pregnancy embedded in the wall of the uterus.  She did very well  postoperatively.  By postoperative day number one her white blood cell count  was 7.6.  Hemoglobin was 11.5.  She was discharged home in good condition on  postoperative day number two.  She was afebrile, tolerating a regular diet,  and voiding without difficulty.  She was given Toradol and Darvocet for  pain.  She was advised to follow up in the office in one week.  She was  advised to call if she has any nausea or vomiting, abdominal pain, or  redness or drainage from incision site, or temperature greater than 100.5.                                               Michelle L. Vincente Poli, M.D.    Florestine Avers  D:  07/31/2002  T:  07/31/2002  Job:  [id number]

## 2010-11-28 ENCOUNTER — Encounter: Payer: Self-pay | Admitting: Internal Medicine

## 2010-11-28 ENCOUNTER — Ambulatory Visit (INDEPENDENT_AMBULATORY_CARE_PROVIDER_SITE_OTHER): Payer: BC Managed Care – PPO | Admitting: Internal Medicine

## 2010-11-28 DIAGNOSIS — J309 Allergic rhinitis, unspecified: Secondary | ICD-10-CM

## 2010-11-28 DIAGNOSIS — R110 Nausea: Secondary | ICD-10-CM

## 2010-11-28 MED ORDER — FLUTICASONE FUROATE 27.5 MCG/SPRAY NA SUSP: 2.0000 | Freq: Every day | NASAL | Status: DC | PRN

## 2010-11-28 MED ORDER — PROMETHAZINE HCL 12.5 MG PO TABS
12.5000 mg | ORAL_TABLET | Freq: Four times a day (QID) | ORAL | Status: DC | PRN
Start: 2010-11-28 — End: 2010-12-05

## 2010-11-28 NOTE — Progress Notes (Signed)
  Subjective:    Patient ID: Morgan Long, female    DOB: Feb 10, 1971, 40 y.o.   MRN: [id number]  HPI 3 days ago she went to a party, had several drinks including vodka (around 5 ETOH servings), inmediately after she developed nausea, she vomited a couple of times, also got dizzy and had an anterior bilateral headache. The headache is intense at times. Overall , dizzines  has decreased, she continue with mild nausea and a headache. She usually does not drink alcohol in the above amount.  Past Medical History  Diagnosis Date  . Endometriosis   . Headache     occasional, dx w/ Migraines before, Topamax helps  . TMJ pain dysfunction syndrome     occasional    Past Surgical History  Procedure Date  . Fallopian tube removed     Left  . Breast enhancement surgery 2006     Review of Systems No fever Some upper abdominal burning, no diarrhea, no change in the color of the stools. She has been taking on and off in the last few days Alka-Seltzer, BC powders and Goody powders. Denies loss of consciousness or head injuries. Allergies have been more noticeable in the last couple of days but denies cough, no green nasal discharge.     Objective:   Physical Exam  Constitutional: She is oriented to person, place, and time. She appears well-developed and well-nourished. No distress.  HENT:  Head: Normocephalic and atraumatic.  Right Ear: External ear normal.  Left Ear: External ear normal.       Slightly tender at the left maxillary sinus. Face symmetric.  Neck: Normal range of motion. Neck supple.  Cardiovascular: Normal rate, regular rhythm and normal heart sounds.   No murmur heard. Pulmonary/Chest: Effort normal and breath sounds normal. No respiratory distress. She has no wheezes. She has no rales.  Abdominal:       Nondistended, soft, mild tenderness without mass or rebound and the epigastric area.  Musculoskeletal: She exhibits no edema.  Neurological: She is alert and  oriented to person, place, and time.       Speech, gait and motor are intact.          Assessment & Plan:

## 2010-11-28 NOTE — Assessment & Plan Note (Signed)
Nausea, dizziness and headache in the setting of recent alcohol intake, she's not used at all to drink alcohol. It is likely that symptoms are related to alcohol intake ;in addition her allergies are slt worse lately Plan: Rest, fluids, dexilant 1 a day (#15 samples provided). Recommend her to call me if symptoms are worse or if not better in 2 or 3 days

## 2010-11-28 NOTE — Patient Instructions (Addendum)
Rest, fluids Avoid motrin, "BCs", any other motrin like medicine dexilant 1 a day on a empty stomach Tylenol as needed for pain veramyst every day Call if no better in 2 or 3 days

## 2010-11-28 NOTE — Assessment & Plan Note (Signed)
slt tender at the left maxillary sinus however no other evidence of acute bacterial sinusitis. Recommend daily use of nasal steroids and call if no better

## 2010-11-30 ENCOUNTER — Telehealth: Payer: Self-pay | Admitting: *Deleted

## 2010-11-30 DIAGNOSIS — R1013 Epigastric pain: Secondary | ICD-10-CM

## 2010-11-30 DIAGNOSIS — R42 Dizziness and giddiness: Secondary | ICD-10-CM

## 2010-11-30 NOTE — Telephone Encounter (Signed)
Message left for patient to return my call.  

## 2010-11-30 NOTE — Telephone Encounter (Signed)
Message copied by Leanne Lovely on Wed Nov 30, 2010 11:23 AM ------      Message from: Leanne Lovely      Created: Tue Nov 29, 2010 11:59 AM       Please check on her , improving?

## 2010-12-01 ENCOUNTER — Other Ambulatory Visit: Payer: Self-pay | Admitting: Internal Medicine

## 2010-12-01 DIAGNOSIS — R42 Dizziness and giddiness: Secondary | ICD-10-CM

## 2010-12-01 DIAGNOSIS — K3189 Other diseases of stomach and duodenum: Secondary | ICD-10-CM

## 2010-12-01 NOTE — Telephone Encounter (Signed)
Pt is aware labs schedule, referral put in. She is aware Dr.Paz wants to see her next week, and to go to the ER if sx severe.

## 2010-12-01 NOTE — Telephone Encounter (Signed)
Please schedule a CT head w/o, CMP, CBC, Amylase , lipase--- ---dx dizzines , dyspepsia Arrange a OV next week ER if sx severe

## 2010-12-01 NOTE — Telephone Encounter (Signed)
Pt states she is still dizzy and having some stomach irritation.

## 2010-12-01 NOTE — Telephone Encounter (Signed)
Message left for patient to return my call.  

## 2010-12-02 ENCOUNTER — Telehealth: Payer: Self-pay | Admitting: *Deleted

## 2010-12-02 ENCOUNTER — Ambulatory Visit (INDEPENDENT_AMBULATORY_CARE_PROVIDER_SITE_OTHER)
Admission: RE | Admit: 2010-12-02 | Discharge: 2010-12-02 | Disposition: A | Discharge status: Home / Self Care | Payer: BC Managed Care – PPO | Source: Ambulatory Visit | Attending: Internal Medicine | Admitting: Internal Medicine

## 2010-12-02 ENCOUNTER — Other Ambulatory Visit (INDEPENDENT_AMBULATORY_CARE_PROVIDER_SITE_OTHER): Payer: BC Managed Care – PPO

## 2010-12-02 DIAGNOSIS — R1013 Epigastric pain: Secondary | ICD-10-CM

## 2010-12-02 DIAGNOSIS — R42 Dizziness and giddiness: Secondary | ICD-10-CM

## 2010-12-02 DIAGNOSIS — K3189 Other diseases of stomach and duodenum: Secondary | ICD-10-CM

## 2010-12-02 LAB — COMPREHENSIVE METABOLIC PANEL
ALT: 15 U/L (ref 0–35)
AST: 17 U/L (ref 0–37)
Albumin: 3.9 g/dL (ref 3.5–5.2)
Alkaline Phosphatase: 60 U/L (ref 39–117)
BUN: 26 mg/dL — ABNORMAL HIGH (ref 6–23)
CO2: 29 mEq/L (ref 19–32)
Calcium: 9.1 mg/dL (ref 8.4–10.5)
Chloride: 107 mEq/L (ref 96–112)
Creatinine, Ser: 0.8 mg/dL (ref 0.4–1.2)
GFR: 102.6 mL/min (ref >60.00)
Glucose, Bld: 84 mg/dL (ref 70–99)
Total Bilirubin: 0.5 mg/dL (ref 0.3–1.2)
Total Protein: 7 g/dL (ref 6.0–8.3)

## 2010-12-02 LAB — CBC WITH DIFFERENTIAL/PLATELET
Basophils Absolute: 0.1 10*3/uL (ref 0.0–0.1)
Eosinophils Absolute: 0.1 10*3/uL (ref 0.0–0.7)
Lymphocytes Relative: 34.4 % (ref 12.0–46.0)
Lymphs Abs: 2.8 10*3/uL (ref 0.7–4.0)
MCHC: 33.6 g/dL (ref 30.0–36.0)
MCV: 91.7 fl (ref 78.0–100.0)
Neutro Abs: 4.5 10*3/uL (ref 1.4–7.7)
Neutrophils Relative %: 54.8 % (ref 43.0–77.0)

## 2010-12-02 LAB — LIPASE: Lipase: 29 U/L (ref 11.0–59.0)

## 2010-12-02 NOTE — Telephone Encounter (Signed)
Message copied by Leanne Lovely on Fri Dec 02, 2010  4:23 PM ------      Message from: Willow Ora E      Created: Fri Dec 02, 2010  4:22 PM       Advise patient, CT of the head negative, all labs within normal. Recommend rest and take fluids, come back next week if not feeling better. ER if symptoms severe.

## 2010-12-02 NOTE — Progress Notes (Signed)
Labs only

## 2010-12-02 NOTE — Progress Notes (Signed)
Addended by: Legrand Como on: 12/02/2010 11:50 AM   Modules accepted: Orders

## 2010-12-02 NOTE — Telephone Encounter (Signed)
Pt is aware.  

## 2010-12-02 NOTE — Telephone Encounter (Signed)
Message left for patient to return my call.  

## 2011-01-26 ENCOUNTER — Ambulatory Visit (INDEPENDENT_AMBULATORY_CARE_PROVIDER_SITE_OTHER): Payer: BC Managed Care – PPO | Admitting: Family Medicine

## 2011-01-26 ENCOUNTER — Encounter: Payer: Self-pay | Admitting: Family Medicine

## 2011-01-26 DIAGNOSIS — J329 Chronic sinusitis, unspecified: Secondary | ICD-10-CM

## 2011-01-26 DIAGNOSIS — N760 Acute vaginitis: Secondary | ICD-10-CM

## 2011-01-26 DIAGNOSIS — J029 Acute pharyngitis, unspecified: Secondary | ICD-10-CM

## 2011-01-26 LAB — POCT RAPID STREP A (OFFICE): Rapid Strep A Screen: NEGATIVE

## 2011-01-26 MED ORDER — IBUPROFEN 800 MG PO TABS: 800.0000 mg | ORAL_TABLET | Freq: Three times a day (TID) | ORAL | Status: DC | PRN

## 2011-01-26 MED ORDER — FLUCONAZOLE 150 MG PO TABS: 150.0000 mg | ORAL_TABLET | Freq: Once | ORAL | Status: AC

## 2011-01-26 MED ORDER — CEFUROXIME AXETIL 500 MG PO TABS: 500.0000 mg | ORAL_TABLET | Freq: Two times a day (BID) | ORAL | Status: AC

## 2011-01-26 NOTE — Patient Instructions (Signed)

## 2011-01-26 NOTE — Progress Notes (Signed)
Addended by: Arnette Norris on: 01/26/2011 01:12 PM   Modules accepted: Orders

## 2011-01-26 NOTE — Progress Notes (Signed)
  Subjective:     Morgan Long is a 40 y.o. female who presents for evaluation of sinus pain. Symptoms include: congestion, cough, facial pain, headaches, nasal congestion, post nasal drip, sinus pressure, sore throat and tooth pain. Onset of symptoms was 7 days ago. Symptoms have been gradually worsening since that time. Past history is significant for no history of pneumonia or bronchitis. Patient is a non-smoker.  The following portions of the patient's history were reviewed and updated as appropriate: allergies, current medications, past family history, past medical history, past social history, past surgical history and problem list.  Review of Systems Pertinent items are noted in HPI.   Objective:    BP 102/72  Pulse 62  Temp(Src) 99.1 F (37.3 C) (Oral)  Wt 162 lb 3.2 oz (73.573 kg)  SpO2 99% General appearance: alert, cooperative, appears stated age and no distress Ears: normal TM's and external ear canals both ears Nose: green discharge, moderate congestion, sinus tenderness bilateral Throat: abnormal findings: moderate oropharyngeal erythema and PNd Neck: mild anterior cervical adenopathy and supple, symmetrical, trachea midline Lungs: clear to auscultation bilaterally Heart: S1, S2 normal Extremities: extremities normal, atraumatic, no cyanosis or edema Lymph nodes: Cervical adenopathy: b/l    Assessment:    Acute bacterial sinusitis.    Plan:    Neti pot recommended. Instructions given. Nasal steroids per medication orders. Antihistamines per medication orders. Follow up in several days or as needed.

## 2011-02-07 ENCOUNTER — Telehealth: Payer: Self-pay | Admitting: Family Medicine

## 2011-02-07 NOTE — Telephone Encounter (Signed)
Please advise      KP 

## 2011-02-07 NOTE — Telephone Encounter (Signed)
Discussed with patient and scheduled Exam for tomorrow     KP

## 2011-02-07 NOTE — Telephone Encounter (Signed)
Needs ov for pelvic exam and cultures

## 2011-02-08 ENCOUNTER — Encounter: Payer: Self-pay | Admitting: Family Medicine

## 2011-02-08 ENCOUNTER — Ambulatory Visit (INDEPENDENT_AMBULATORY_CARE_PROVIDER_SITE_OTHER): Payer: BC Managed Care – PPO | Admitting: Family Medicine

## 2011-02-08 VITALS — BP 90/58 | HR 83 | Temp 99.2°F | Wt 160.0 lb

## 2011-02-08 DIAGNOSIS — N760 Acute vaginitis: Secondary | ICD-10-CM

## 2011-02-08 MED ORDER — TERCONAZOLE 0.4 % VA CREA: 1.0000 | TOPICAL_CREAM | Freq: Every day | VAGINAL | Status: DC

## 2011-02-08 NOTE — Progress Notes (Signed)
  Subjective:    Patient ID: Morgan Long, female    DOB: 1970-10-29, 40 y.o.   MRN: [id number]  HPI Pt here c/o vaginal d/c since last visit--diflucan didn't work.   See last visit.   Review of Systems As above    Objective:   Physical Exam  Constitutional: She appears well-developed and well-nourished.  Abdominal: Soft. She exhibits no distension. There is no tenderness. There is no rebound and no guarding.  Genitourinary: There is no rash, tenderness, lesion or injury on the right labia. There is no rash, tenderness, lesion or injury on the left labia. There is erythema around the vagina. No tenderness or bleeding around the vagina. No foreign body around the vagina. No signs of injury around the vagina. Vaginal discharge found.          Assessment & Plan:  Vaginitis---terazol 7                   Wet prep sent                    rto prn

## 2011-02-08 NOTE — Patient Instructions (Signed)
Monilial Vaginitis (Yeast Infections) Vaginitis in a soreness, swelling and redness (inflammation) of the vagina and vulva. Monilial vaginitis is not a sexually transmitted infection.  CAUSES Yeast vaginitis is caused by yeast (candida) that is normally found in your vagina. With a yeast infection, the candida has overgrown in number to a point that upsets the chemical balance. SYMPTOMS   White thick vaginal discharge.   Swelling, itching, redness and irritation of the vagina and possibly the lips of the vagina (vulva).   Burning or painful urination.   Painful intercourse.  DIAGNOSIS Things that may contribute to monilial vaginitis are:  Postmenopausal and virginal states.  Pregnancy.   Infections.   Being tired, sick or stressed, especially if you had monilial vaginitis in the past.   Diabetes. Good control will help lower the chance.   Birth control pills.  Tight fitting garments.   Using bubble bath, feminine sprays, douches or deodorant tampons.   Taking certain medications that kill germs (antibiotics).   Sporadic recurrence can occur if you become ill.   TREATMENT  Your caregiver will give you medication.  There are several kinds of anti monilial vaginal creams and suppositories specific for monilial vaginitis. For recurrent yeast infections, use a suppository or cream in the vagina 2 times a week, or as directed.   Anti-monilial or steroid cream for the itching or irritation of the vulva may also be used. Get your caregiver's permission.   Painting the vagina with methylene blue solution may help if the monilial cream does not work.   Eating yogurt may help prevent monilial vaginitis.  HOME CARE INSTRUCTIONS  Finish all medication as prescribed.   Do not have sex until treatment is completed or after your caregiver tells you it is okay.   Take warm sitz baths.   Do not douche.   Do not use tampons, especially scented ones.   Wear cotton underwear.    Avoid tight pants and panty hose.   Tell your sexual partner that you have a yeast infection. They should go to their caregiver if they have symptoms such as mild rash or itching.   Your sexual partner should be treated as well if your infection is difficult to eliminate.   Practice safer sex. Use condoms.   Some vaginal medications cause latex condoms to fail. Vaginal medications that harm condoms are:   Cleocin cream.   Butoconazole (Femstat).   Terconazole (Terazol) vaginal suppository.   Miconazole (Monistat) (may be purchased over the counter).  SEEK MEDICAL CARE IF:  You have a temperature by mouth above 100.4.   The infection is getting worse after 2 days of treatment.   The infection is not getting better after 3 days of treatment.   You develop blisters in or around your vagina.   You develop vaginal bleeding, and it is not your menstrual period.   You have pain when you urinate.   You develop intestinal problems.   You have pain with sexual intercourse.  Document Released: 03/01/2005 Document Re-Released: 08/16/2009 [facility] Patient Information 2011 [place], [place].

## 2011-02-18 ENCOUNTER — Other Ambulatory Visit: Payer: Self-pay | Admitting: Family Medicine

## 2011-02-27 ENCOUNTER — Other Ambulatory Visit (INDEPENDENT_AMBULATORY_CARE_PROVIDER_SITE_OTHER): Payer: BC Managed Care – PPO

## 2011-02-27 DIAGNOSIS — Z01818 Encounter for other preprocedural examination: Secondary | ICD-10-CM

## 2011-02-27 NOTE — Progress Notes (Signed)
Labs only sent to [facility]. Labs to be FAX TO: Dr Sedalia Muta

## 2011-02-28 LAB — PROTIME-INR

## 2011-03-04 ENCOUNTER — Telehealth: Payer: Self-pay | Admitting: Internal Medicine

## 2011-03-04 NOTE — Telephone Encounter (Signed)
labs ordered by   Dr Sedalia Muta not done, did she came to the office to get her blood draw?

## 2011-03-06 ENCOUNTER — Other Ambulatory Visit: Payer: Self-pay | Admitting: Obstetrics and Gynecology

## 2011-03-06 DIAGNOSIS — Z1231 Encounter for screening mammogram for malignant neoplasm of breast: Secondary | ICD-10-CM

## 2011-03-06 NOTE — Telephone Encounter (Signed)
Labs drawn at [facility]; requested to be faxed. Received and forwarded to MD.

## 2011-03-06 NOTE — Telephone Encounter (Signed)
Gave to [place] to research.

## 2011-03-13 ENCOUNTER — Ambulatory Visit
Admission: RE | Admit: 2011-03-13 | Discharge: 2011-03-13 | Disposition: A | Discharge status: Home / Self Care | Payer: BC Managed Care – PPO | Source: Ambulatory Visit | Attending: Obstetrics and Gynecology | Admitting: Obstetrics and Gynecology

## 2011-03-13 DIAGNOSIS — Z1231 Encounter for screening mammogram for malignant neoplasm of breast: Secondary | ICD-10-CM

## 2011-03-28 ENCOUNTER — Ambulatory Visit (INDEPENDENT_AMBULATORY_CARE_PROVIDER_SITE_OTHER): Payer: BC Managed Care – PPO | Admitting: Internal Medicine

## 2011-03-28 ENCOUNTER — Encounter: Payer: Self-pay | Admitting: Internal Medicine

## 2011-03-28 DIAGNOSIS — J310 Chronic rhinitis: Secondary | ICD-10-CM | POA: Insufficient documentation

## 2011-03-28 DIAGNOSIS — Z0000 Encounter for general adult medical examination without abnormal findings: Secondary | ICD-10-CM | POA: Insufficient documentation

## 2011-03-28 DIAGNOSIS — Z23 Encounter for immunization: Secondary | ICD-10-CM

## 2011-03-28 DIAGNOSIS — J309 Allergic rhinitis, unspecified: Secondary | ICD-10-CM

## 2011-03-28 LAB — LIPID PANEL
Cholesterol: 188 mg/dL (ref 0–200)
LDL Cholesterol: 112 mg/dL — ABNORMAL HIGH (ref 0–99)
Triglycerides: 54 mg/dL (ref 0.0–149.0)
VLDL: 10.8 mg/dL (ref 0.0–40.0)

## 2011-03-28 MED ORDER — MOMETASONE FUROATE 50 MCG/ACT NA SUSP: 2.0000 | Freq: Every day | NASAL | Status: DC

## 2011-03-28 NOTE — Assessment & Plan Note (Addendum)
Td 2009 Flu shot today All recent labs reviewed, will do a FLP and TSH today. Diet-exercise discussed  Sees gyn for female care

## 2011-03-28 NOTE — Progress Notes (Signed)
Addended by: Helane Rima I on: 03/28/2011 05:32 PM   Modules accepted: Orders

## 2011-03-28 NOTE — Progress Notes (Signed)
  Subjective:    Patient ID: Morgan Long, female    DOB: 1970-09-22, 40 y.o.   MRN: [id number]  HPI Complete physical exam In general feels well, she is using Veramist  for allergies as needed, occasionally sees traces of blood on the nasal discharge.  Past Medical History  Diagnosis Date  . Endometriosis   . Headache     occasional, dx w/ Migraines before, Topamax helps  . TMJ pain dysfunction syndrome     occasional  . Allergic rhinitis    Past Surgical History  Procedure Date  . Fallopian tube removed     Left  . Breast enhancement surgery 2006   History   Social History  . Marital Status: Single    Spouse Name: N/A    Number of Children: 0  . Years of Education: N/A   Occupational History  . Sports coach, going to school, case manager for  mental health    Social History Main Topics  . Smoking status: Never Smoker   . Smokeless tobacco: Never Used  . Alcohol Use: Yes     socially   . Drug Use: No  . Sexually Active: Not on file   Other Topics Concern  . Not on file   Social History Narrative   Household:sister and her 3 kids----Diet:eating healthier again----Exercise: routinely---   Family History  Problem Relation Age of Onset  . Breast cancer      3 aunts dx age 6s  . Colon cancer Neg Hx   . Heart attack Neg Hx   . Diabetes      grandmother  . Hypertension      several family members  . Throat cancer      M, uncle      Review of Systems Denies chest pain or shortness or breath No nausea, vomiting, diarrhea No anxiety or depression Occasional constipation, no blood in the stools.    Objective:   Physical Exam  Constitutional: She is oriented to person, place, and time. She appears well-developed and well-nourished. No distress.  HENT:  Head: Normocephalic and atraumatic.  Right Ear: External ear normal.  Left Ear: External ear normal.  Mouth/Throat: No oropharyngeal exudate.       Turbinates slightly red and swollen, worse on  the left  Cardiovascular: Normal rate, regular rhythm and normal heart sounds.   No murmur heard. Pulmonary/Chest: Effort normal and breath sounds normal. No respiratory distress. She has no wheezes. She has no rales.  Abdominal: Soft. Bowel sounds are normal. She exhibits no distension. There is no tenderness. There is no rebound and no guarding.  Musculoskeletal: She exhibits no edema.  Neurological: She is alert and oriented to person, place, and time.  Skin: She is not diaphoretic.  Psychiatric: She has a normal mood and affect. Her behavior is normal. Judgment and thought content normal.          Assessment & Plan:

## 2011-03-28 NOTE — Assessment & Plan Note (Addendum)
veramyst apparently causing some nasal bleeding. Plan: Switch to Nasonex

## 2011-03-28 NOTE — Patient Instructions (Signed)
Use Zyrtec and Nasonex daily, call if the allergies are not better

## 2011-03-29 ENCOUNTER — Telehealth: Payer: Self-pay

## 2011-03-29 NOTE — Telephone Encounter (Signed)
Left message for pt to call back  °

## 2011-03-29 NOTE — Telephone Encounter (Signed)
Message copied by Beverely Low on Wed Mar 29, 2011 10:03 AM ------      Message from: Wanda Plump      Created: Tue Mar 28, 2011  6:15 PM       Advise  patient, her cholesterol and thyroid tests are normal.       Good results

## 2011-04-19 ENCOUNTER — Ambulatory Visit: Payer: BC Managed Care – PPO | Admitting: Internal Medicine

## 2011-04-20 ENCOUNTER — Encounter: Payer: Self-pay | Admitting: Internal Medicine

## 2011-04-20 ENCOUNTER — Ambulatory Visit (INDEPENDENT_AMBULATORY_CARE_PROVIDER_SITE_OTHER): Payer: BC Managed Care – PPO | Admitting: Internal Medicine

## 2011-04-20 VITALS — BP 100/80 | HR 78 | Temp 98.4°F | Wt 163.8 lb

## 2011-04-20 DIAGNOSIS — H119 Unspecified disorder of conjunctiva: Secondary | ICD-10-CM

## 2011-04-20 DIAGNOSIS — H109 Unspecified conjunctivitis: Secondary | ICD-10-CM

## 2011-04-20 NOTE — Progress Notes (Signed)
  Subjective:    Patient ID: Morgan Long, female    DOB: 1970/12/09, 40 y.o.   MRN: [id number]  HPI 2 days ago, a skin product accidentally went to her L eye, it got slt red, she clean her aye w/ water; the next day it was a lot worse, today her eye in AM had abundant dry d/c  Past Medical History  Diagnosis Date  . Endometriosis   . Headache     occasional, dx w/ Migraines before, Topamax helps  . TMJ pain dysfunction syndrome     occasional  . Allergic rhinitis    Past Surgical History  Procedure Date  . Fallopian tube removed     Left  . Breast enhancement surgery 2006    Review of Systems Vision slt blurred A nephew had "pink eye"    Objective:   Physical Exam  Constitutional: She appears well-developed and well-nourished.  Eyes:       EOMI R eye normal L eye: Pupil reactive, anterior chamber clear, ++ redness and watery d/c          Assessment & Plan:  Conjuntivitis: Chemical vs. Viral I'll ask for a ophthalmologic consult today

## 2011-04-20 NOTE — Patient Instructions (Signed)
Go to Dr Abran Duke now [address] * [place], [place] [address]

## 2011-05-16 ENCOUNTER — Other Ambulatory Visit: Payer: Self-pay | Admitting: Internal Medicine

## 2011-05-16 NOTE — Telephone Encounter (Signed)
Last OV 04-20-11, last refilled 12-16-07  BACLOFEN 10 MG  TABS (BACLOFEN) 1 -2 at bedtime until TMJ better  #30 x 6

## 2011-05-17 NOTE — Telephone Encounter (Signed)
Ok to call baclofen 10 mg 1 or 2 at bedtime as needed for TMJ. # 60, no refills. If she is not better she needs to be seen or consult with her dentist. She cannot take this medication if she is pregnant

## 2011-05-17 NOTE — Telephone Encounter (Signed)
Left message to call office

## 2011-05-18 NOTE — Telephone Encounter (Signed)
Pt return call Left message to call office.  

## 2011-05-19 ENCOUNTER — Telehealth: Payer: Self-pay

## 2011-05-19 MED ORDER — BACLOFEN 10 MG PO TABS: ORAL_TABLET | ORAL | Status: DC

## 2011-05-19 NOTE — Telephone Encounter (Signed)
Pt return call Left message to call office.  

## 2011-05-19 NOTE — Telephone Encounter (Addendum)
msg from patient requesting Baclofen per previous phone note ok to send but still pending. Rx faxed     KP

## 2011-05-31 MED ORDER — BACLOFEN 10 MG PO TABS: 10.0000 mg | ORAL_TABLET | Freq: Three times a day (TID) | ORAL | Status: AC

## 2011-05-31 NOTE — Telephone Encounter (Signed)
Discuss with patient  

## 2011-06-07 ENCOUNTER — Telehealth: Payer: Self-pay | Admitting: Internal Medicine

## 2011-06-07 NOTE — Telephone Encounter (Signed)
Patient wants to know if she could take flagyl  For a sinus infection - she has some left over

## 2011-06-07 NOTE — Telephone Encounter (Signed)
Flagyl is not indicated for respiratory infections / sinusitis. Recommend rest, fluids, mucinex, Tylenol and office visit if she is not improving

## 2011-06-07 NOTE — Telephone Encounter (Signed)
Discuss with patient  

## 2011-06-07 NOTE — Telephone Encounter (Signed)
Please advise 

## 2011-06-09 ENCOUNTER — Encounter: Payer: Self-pay | Admitting: Family Medicine

## 2011-06-09 ENCOUNTER — Ambulatory Visit (INDEPENDENT_AMBULATORY_CARE_PROVIDER_SITE_OTHER): Payer: BC Managed Care – PPO | Admitting: Family Medicine

## 2011-06-09 VITALS — BP 112/74 | HR 99 | Temp 98.7°F | Wt 166.0 lb

## 2011-06-09 DIAGNOSIS — J019 Acute sinusitis, unspecified: Secondary | ICD-10-CM

## 2011-06-09 DIAGNOSIS — J329 Chronic sinusitis, unspecified: Secondary | ICD-10-CM

## 2011-06-09 MED ORDER — GUAIFENESIN-CODEINE 100-10 MG/5ML PO SYRP: ORAL_SOLUTION | ORAL | Status: DC

## 2011-06-09 MED ORDER — CEFUROXIME AXETIL 500 MG PO TABS: 500.0000 mg | ORAL_TABLET | Freq: Two times a day (BID) | ORAL | Status: AC

## 2011-06-09 NOTE — Progress Notes (Signed)
  Subjective:        Objective:       Assessment:     Subjective:     Morgan Long is a 41 y.o. female who presents for evaluation of sinus pain. Symptoms include: sinus pressure, congestion, no fever. Onset of symptoms was 2 weeks ago. Symptoms have been gradually worsening since that time. Past history is significant for nothing. Patient is not a smoker.  ROS--- as above Past Medical History  Diagnosis Date  . Endometriosis   . Headache     occasional, dx w/ Migraines before, Topamax helps  . TMJ pain dysfunction syndrome     occasional  . Allergic rhinitis    Medications: Current Outpatient Prescriptions on File Prior to Visit  Medication Sig Dispense Refill  . baclofen (LIORESAL) 10 MG tablet Take 1 tablet (10 mg total) by mouth 3 (three) times daily. 1 or 2 at bedtime as needed for TMJ  60 each  0  . cetirizine (ZYRTEC) 10 MG tablet Take 10 mg by mouth daily.        Marland Kitchen etonogestrel-ethinyl estradiol (NUVARING) 0.12-0.015 MG/24HR vaginal ring Place 1 each vaginally every 28 (twenty-eight) days. Insert vaginally and leave in place for 3 consecutive weeks, then remove for 1 week.       Marland Kitchen ibuprofen (ADVIL,MOTRIN) 800 MG tablet TAKE 1 TABLET BY MOUTH EVERY 8 HOURS AS NEEDED FOR PAIN  30 tablet  0  . mometasone (NASONEX) 50 MCG/ACT nasal spray Place 2 sprays into the nose daily.  17 g  12      Allergies:   Review of Systems   Objective:    BP 112/74  Pulse 99  Temp(Src) 98.7 F (37.1 C) (Oral)  Wt 166 lb (75.297 kg)  SpO2 97%  General Appearance:    Alert, cooperative, no distress, appears stated age  Head:    Normocephalic, without obvious abnormality, atraumatic  Eyes:    PERRL, conjunctiva/corneas clear, EOM's intact, fundi    benign, both eyes  Ears:    Normal TM's and external ear canals, both ears  Nose:  + b/l sinus pressure/ tenderness, turb errythematous and swollen  Throat:   Lips, mucosa, and tongue normal; teeth and gums normal  Neck:   Supple,  symmetrical, trachea midline, + adenopathy;      Back:     na  Lungs:     Clear to auscultation bilaterally, respirations unlabored  Chest Wall:    No tenderness or deformity   Heart:    Regular rate and rhythm, S1 and S2 normal, no murmur, rub   or gallop  Breast Exam:  na  Abdomen:   na  Genitalia:    na  Rectal:   na  Extremities:  na  Pulses: na  Skin:  na  Lymph nodes:   Cervical, supraclavicular, and axillary nodes normal  Neurologic:   na     Assessment:    Acute bacterial sinusitis.    Plan:    take abx for 10 days--see orders  con't nasal spray and antihistamine

## 2011-06-09 NOTE — Patient Instructions (Signed)

## 2011-06-13 ENCOUNTER — Other Ambulatory Visit: Payer: Self-pay | Admitting: Internal Medicine

## 2011-06-14 NOTE — Telephone Encounter (Signed)
Left message to call office. Rx request is for antibiotic, these med are not usually refillable. Call to see if Pt is having symptoms that require med.

## 2011-06-16 NOTE — Telephone Encounter (Signed)
Left message to call office

## 2011-06-20 ENCOUNTER — Encounter: Payer: Self-pay | Admitting: *Deleted

## 2011-06-20 NOTE — Telephone Encounter (Signed)
Left message to call office, Letter Mail after several attempts to contact Pt. 

## 2011-08-02 ENCOUNTER — Telehealth: Payer: Self-pay | Admitting: Internal Medicine

## 2011-08-02 NOTE — Telephone Encounter (Signed)
Patient called & stated she did cancel the appoint 08/24/10 & resheduled for 08/25/2010, can you credit the $50.00 no show fee? Please call @ [phone] Thank You Judeth Cornfield

## 2011-08-08 ENCOUNTER — Other Ambulatory Visit: Payer: Self-pay | Admitting: Family Medicine

## 2011-08-08 NOTE — Telephone Encounter (Signed)
Refill done.  

## 2011-08-11 NOTE — Telephone Encounter (Signed)
Sent request 08/11/11 to remove

## 2011-08-21 ENCOUNTER — Telehealth: Payer: Self-pay | Admitting: Internal Medicine

## 2011-08-21 NOTE — Telephone Encounter (Signed)
Discuss with patient, appt scheduled. 

## 2011-08-21 NOTE — Telephone Encounter (Signed)
Left message to call office. Pt last seen 06-09-11 for sinusitis. Please advise if another OV will be needed.

## 2011-08-21 NOTE — Telephone Encounter (Signed)
Really needs to be seen, please schedule at some point this week

## 2011-08-21 NOTE — Telephone Encounter (Signed)
Patient called & stated she is still having headaches related to her Sinus Infection & nothing over the counter is working. Can she be prescribed something for the headaches? Please call at work [phone] Uses [facility] on [address]

## 2011-08-23 ENCOUNTER — Ambulatory Visit: Payer: BC Managed Care – PPO | Admitting: Internal Medicine

## 2011-08-25 ENCOUNTER — Ambulatory Visit (INDEPENDENT_AMBULATORY_CARE_PROVIDER_SITE_OTHER): Payer: BC Managed Care – PPO | Admitting: Family Medicine

## 2011-08-25 ENCOUNTER — Encounter: Payer: Self-pay | Admitting: Family Medicine

## 2011-08-25 VITALS — BP 114/72 | HR 86 | Temp 98.7°F | Wt 163.0 lb

## 2011-08-25 DIAGNOSIS — J329 Chronic sinusitis, unspecified: Secondary | ICD-10-CM

## 2011-08-25 MED ORDER — FLUCONAZOLE 150 MG PO TABS: ORAL_TABLET | ORAL | Status: DC

## 2011-08-25 MED ORDER — CEFUROXIME AXETIL 500 MG PO TABS: 500.0000 mg | ORAL_TABLET | Freq: Two times a day (BID) | ORAL | Status: AC

## 2011-08-25 MED ORDER — TRAMADOL HCL 50 MG PO TABS: 50.0000 mg | ORAL_TABLET | Freq: Three times a day (TID) | ORAL | Status: AC | PRN

## 2011-08-25 NOTE — Patient Instructions (Signed)

## 2011-08-25 NOTE — Progress Notes (Signed)
  Subjective:     Morgan Long is a 41 y.o. female who presents for evaluation of sinus pain. Symptoms include: congestion, facial pain, headaches, nasal congestion and sinus pressure. Onset of symptoms was 2 weeks ago. Symptoms have been gradually worsening since that time. Past history is significant for no history of pneumonia or bronchitis. Patient is a non-smoker.  The following portions of the patient's history were reviewed and updated as appropriate: allergies, current medications, past family history, past medical history, past social history, past surgical history and problem list.  Review of Systems Pertinent items are noted in HPI.   Objective:    BP 114/72  Pulse 86  Temp(Src) 98.7 F (37.1 C) (Oral)  Wt 163 lb (73.936 kg)  SpO2 99% General appearance: alert, cooperative, appears stated age and no distress Ears: normal TM's and external ear canals both ears Nose: green discharge, mild congestion, turbinates red, swollen, sinus tenderness bilateral Throat: lips, mucosa, and tongue normal; teeth and gums normal Neck: no adenopathy, supple, symmetrical, trachea midline and thyroid not enlarged, symmetric, no tenderness/mass/nodules Lungs: clear to auscultation bilaterally Lymph nodes: Cervical, supraclavicular, and axillary nodes normal.    Assessment:    Acute bacterial sinusitis.    Plan:    Nasal steroids per medication orders. Antihistamines per medication orders. Ceftin per medication orders.

## 2011-10-10 ENCOUNTER — Other Ambulatory Visit: Payer: Self-pay | Admitting: Internal Medicine

## 2011-10-10 ENCOUNTER — Ambulatory Visit (INDEPENDENT_AMBULATORY_CARE_PROVIDER_SITE_OTHER): Payer: BC Managed Care – PPO | Admitting: Internal Medicine

## 2011-10-10 ENCOUNTER — Encounter: Payer: Self-pay | Admitting: Internal Medicine

## 2011-10-10 VITALS — BP 112/72 | HR 83 | Temp 98.8°F | Wt 155.0 lb

## 2011-10-10 DIAGNOSIS — J029 Acute pharyngitis, unspecified: Secondary | ICD-10-CM | POA: Insufficient documentation

## 2011-10-10 NOTE — Assessment & Plan Note (Addendum)
Presents w/ ST, exam benigh. Pt is concerned about infex, specifically STDs Plan: Throat Cx, throat G&C, Rx abx if appropiate safe sex discussed  Call results to [phone]

## 2011-10-10 NOTE — Patient Instructions (Signed)
Rest, fluids , tylenol If  cough, take Mucinex DM twice a day as needed  Use claritin or Zyrtec OTC as needed for allergies Call if no better in few days Call anytime if the symptoms are severe

## 2011-10-10 NOTE — Progress Notes (Signed)
  Subjective:    Patient ID: Morgan Long, female    DOB: 1970-12-15, 41 y.o.   MRN: [id number]  HPI Acute visit One-day history of sore throat, area is slightly itchy. She is concerned about STDs in her throat.  Past Medical History  Diagnosis Date  . Endometriosis   . Headache     occasional, dx w/ Migraines before, Topamax helps  . TMJ pain dysfunction syndrome     occasional  . Allergic rhinitis     Review of Systems She denies fever or chills. Denies any sinus pain or congestion. No actual chest congestion but she did cough some today. Mild nausea.     Objective:   Physical Exam  General -- alert, well-developed. No apparent distress.  HEENT -- TMs normal, throat w/o redness, face symmetric and not tender to palpation, nose not congested   Lungs -- normal respiratory effort, no intercostal retractions, no accessory muscle use, and normal breath sounds.   Heart-- normal rate, regular rhythm, no murmur, and no gallop.   Neurologic-- alert & oriented X3 and strength normal in all extremities. Psych-- Cognition and judgment appear intact. Alert and cooperative with normal attention span and concentration.  not anxious appearing and not depressed appearing.       Assessment & Plan:

## 2011-10-12 LAB — CULTURE, GROUP A STREP: Organism ID, Bacteria: NORMAL

## 2011-10-13 ENCOUNTER — Ambulatory Visit: Payer: BC Managed Care – PPO | Admitting: Internal Medicine

## 2011-10-13 LAB — GONOCOCCUS CULTURE: Organism ID, Bacteria: NO GROWTH

## 2011-10-20 ENCOUNTER — Other Ambulatory Visit: Payer: Self-pay | Admitting: *Deleted

## 2011-10-20 DIAGNOSIS — M7918 Myalgia, other site: Secondary | ICD-10-CM

## 2011-10-24 ENCOUNTER — Inpatient Hospital Stay: Admission: RE | Admit: 2011-10-24 | Payer: BC Managed Care – PPO | Source: Ambulatory Visit

## 2011-10-25 ENCOUNTER — Encounter: Payer: Self-pay | Admitting: *Deleted

## 2011-11-02 ENCOUNTER — Encounter: Payer: Self-pay | Admitting: Family Medicine

## 2011-11-02 ENCOUNTER — Encounter (HOSPITAL_BASED_OUTPATIENT_CLINIC_OR_DEPARTMENT_OTHER): Payer: Self-pay | Admitting: *Deleted

## 2011-11-02 ENCOUNTER — Emergency Department (HOSPITAL_BASED_OUTPATIENT_CLINIC_OR_DEPARTMENT_OTHER)
Admission: EM | Admit: 2011-11-02 | Discharge: 2011-11-02 | Disposition: A | Discharge status: Home / Self Care | Payer: BC Managed Care – PPO | Attending: Emergency Medicine | Admitting: Emergency Medicine

## 2011-11-02 ENCOUNTER — Ambulatory Visit (INDEPENDENT_AMBULATORY_CARE_PROVIDER_SITE_OTHER): Payer: BC Managed Care – PPO | Admitting: Family Medicine

## 2011-11-02 VITALS — BP 108/68 | HR 83 | Temp 98.5°F | Wt 159.4 lb

## 2011-11-02 DIAGNOSIS — Z886 Allergy status to analgesic agent status: Secondary | ICD-10-CM | POA: Insufficient documentation

## 2011-11-02 DIAGNOSIS — R21 Rash and other nonspecific skin eruption: Secondary | ICD-10-CM | POA: Insufficient documentation

## 2011-11-02 DIAGNOSIS — R609 Edema, unspecified: Secondary | ICD-10-CM | POA: Insufficient documentation

## 2011-11-02 DIAGNOSIS — L309 Dermatitis, unspecified: Secondary | ICD-10-CM

## 2011-11-02 MED ORDER — PREDNISONE 10 MG PO TABS: ORAL_TABLET | ORAL | Status: DC

## 2011-11-02 MED ORDER — ONDANSETRON 8 MG PO TBDP: 4.0000 mg | ORAL_TABLET | Freq: Three times a day (TID) | ORAL | Status: AC | PRN

## 2011-11-02 MED ORDER — TRIAMCINOLONE ACETONIDE 0.1 % EX CREA: TOPICAL_CREAM | CUTANEOUS | Status: DC

## 2011-11-02 MED ORDER — ONDANSETRON 4 MG PO TBDP
4.0000 mg | ORAL_TABLET | Freq: Once | ORAL | Status: AC
  Administered 2011-11-02: 4 mg via ORAL
  Filled 2011-11-02: qty 1

## 2011-11-02 MED ORDER — METHYLPREDNISOLONE ACETATE 80 MG/ML IJ SUSP
80.0000 mg | Freq: Once | INTRAMUSCULAR | Status: AC
  Administered 2011-11-02: 80 mg via INTRAMUSCULAR

## 2011-11-02 NOTE — Progress Notes (Signed)
  Subjective:     Morgan Long is a 41 y.o. female who presents for evaluation of a rash involving the leg. Rash started a few days ago. Lesions are pink, and blistering in texture. Rash has changed over time. Rash is pruritic. Associated symptoms: headache. Patient denies: abdominal pain, arthralgia, congestion, cough, crankiness, decrease in appetite, decrease in energy level, fever, irritability, myalgia, nausea, sore throat and vomiting. Patient has not had contacts with similar rash. Patient has had new exposures (soaps, lotions, laundry detergents, foods, medications, plants, insects or animals).  The following portions of the patient's history were reviewed and updated as appropriate: allergies, current medications, past family history, past medical history, past social history, past surgical history and problem list.  Review of Systems Pertinent items are noted in HPI.    Objective:    BP 108/68  Pulse 83  Temp(Src) 98.5 F (36.9 C) (Oral)  Wt 159 lb 6.4 oz (72.303 kg)  SpO2 97%  LMP 10/15/2011 General:  alert, cooperative, appears stated age and no distress  Skin:  vesicles noted on low leg     Assessment:    contact dermatitis: plants poison ivy    Plan:    Medications: steroids: pred taper and depomedrol. Written patient instruction given. Follow up in a few days.

## 2011-11-02 NOTE — ED Notes (Signed)
Pt c/o lower left leg insect bite x 2 days ago, and also c/o SOb x 1 day

## 2011-11-02 NOTE — ED Provider Notes (Signed)
History     CSN: [id number]  Arrival date & time 11/02/11  0103   First MD Initiated Contact with Patient 11/02/11 0205      Chief Complaint  Patient presents with  . Insect Bite  . Shortness of Breath    (Consider location/radiation/quality/duration/timing/severity/associated sxs/prior treatment) HPI This is a 41 year old black female who states she was walking and grass and believes she was stung or bitten multiple times on her ankles. Since then she has developed a vesicular, erythematous, pruritic rash of the ankles associated with a trace of edema. She has also felt short of breath, "swimmy headed" and has had a headache. She is placed 1% hydrocortisone cream on the rash without relief. She takes Zyrtec regularly and this is not resolved the itching. The symptoms are mild to moderate.  Past Medical History  Diagnosis Date  . Endometriosis   . Headache     occasional, dx w/ Migraines before, Topamax helps  . TMJ pain dysfunction syndrome     occasional  . Allergic rhinitis     Past Surgical History  Procedure Date  . Fallopian tube removed     Left  . Breast enhancement surgery 2006    Family History  Problem Relation Age of Onset  . Breast cancer      3 aunts dx age 37s  . Colon cancer Neg Hx   . Heart attack Neg Hx   . Diabetes      grandmother  . Hypertension      several family members  . Throat cancer      M, uncle     History  Substance Use Topics  . Smoking status: Never Smoker   . Smokeless tobacco: Never Used  . Alcohol Use: Yes     socially     OB History    Grav Para Term Preterm Abortions TAB SAB Ect Mult Living                  Review of Systems  All other systems reviewed and are negative.    Allergies  Codeine  Home Medications   Current Outpatient Rx  Name Route Sig Dispense Refill  . CETIRIZINE HCL 10 MG PO TABS Oral Take 10 mg by mouth daily.      . ETONOGESTREL-ETHINYL ESTRADIOL 0.12-0.015 MG/24HR VA RING Vaginal  Place 1 each vaginally every 28 (twenty-eight) days. Insert vaginally and leave in place for 3 consecutive weeks, then remove for 1 week.     . GUAIFENESIN ER 600 MG PO TB12 Oral Take 1,200 mg by mouth 2 (two) times daily.      . GUAIFENESIN-CODEINE 100-10 MG/5ML PO SYRP  1-2 tsp po qhs prn cough 120 mL 0  . IBUPROFEN 800 MG PO TABS  TAKE 1 TABLET BY MOUTH EVERY 8 HOURS AS NEEDED FOR PAIN 30 tablet 0  . MOMETASONE FUROATE 50 MCG/ACT NA SUSP Nasal Place 2 sprays into the nose daily. 17 g 12    BP 132/77  Pulse 82  Temp(Src) 98.4 F (36.9 C) (Oral)  Resp 16  Ht 5\' 3"  (1.6 m)  Wt 150 lb (68.04 kg)  BMI 26.57 kg/m2  SpO2 100%  LMP 10/15/2011  Physical Exam General: Well-developed, well-nourished female in no acute distress; appearance consistent with age of record HENT: normocephalic, atraumatic Eyes: pupils equal round and reactive to light; extraocular muscles intact Neck: supple Heart: regular rate and rhythm Lungs: clear to auscultation bilaterally Abdomen: soft; nondistended; bowel sounds present Extremities:  No deformity; full range of motion; pulses normal Neurologic: Awake, alert and oriented; motor function intact in all extremities and symmetric; no facial droop Skin: Warm and dry; patchy vesicular, erythematous dermatitis of distal lower legs with trace edema Psychiatric: Anxious    ED Course  Procedures (including critical care time)     MDM  Rash has the appearance of poison ivy or poison oak. Patient is insistent that she was bitten by insects. We will treat with more potent topical steroid. Her systemic symptoms and local symptoms are not severe enough at this time for systemic steroids. She was advised to return for worsening symptoms.        Hanley Seamen, MD 11/02/11 [phone]

## 2011-11-02 NOTE — ED Notes (Signed)
Pt sts rash started two days ago,has became worst with blister spreading on lower legs

## 2011-11-10 ENCOUNTER — Other Ambulatory Visit: Payer: BC Managed Care – PPO

## 2011-11-30 ENCOUNTER — Other Ambulatory Visit: Payer: Self-pay | Admitting: *Deleted

## 2011-11-30 DIAGNOSIS — R52 Pain, unspecified: Secondary | ICD-10-CM

## 2011-11-30 DIAGNOSIS — IMO0002 Reserved for concepts with insufficient information to code with codable children: Secondary | ICD-10-CM

## 2011-12-05 ENCOUNTER — Ambulatory Visit
Admission: RE | Admit: 2011-12-05 | Discharge: 2011-12-05 | Disposition: A | Discharge status: Home / Self Care | Payer: BC Managed Care – PPO | Source: Ambulatory Visit | Attending: *Deleted | Admitting: *Deleted

## 2011-12-05 DIAGNOSIS — IMO0002 Reserved for concepts with insufficient information to code with codable children: Secondary | ICD-10-CM

## 2011-12-05 DIAGNOSIS — R52 Pain, unspecified: Secondary | ICD-10-CM

## 2011-12-18 ENCOUNTER — Ambulatory Visit
Admission: RE | Admit: 2011-12-18 | Discharge: 2011-12-18 | Disposition: A | Discharge status: Home / Self Care | Payer: BC Managed Care – PPO | Source: Ambulatory Visit | Attending: *Deleted | Admitting: *Deleted

## 2011-12-18 DIAGNOSIS — M7918 Myalgia, other site: Secondary | ICD-10-CM

## 2011-12-20 ENCOUNTER — Telehealth: Payer: Self-pay | Admitting: Internal Medicine

## 2011-12-20 DIAGNOSIS — M26609 Unspecified temporomandibular joint disorder, unspecified side: Secondary | ICD-10-CM

## 2011-12-20 NOTE — Telephone Encounter (Signed)
She has a long history of TMJ problems, arrange a  ENT referral

## 2011-12-20 NOTE — Telephone Encounter (Signed)
Ok to set up referral? 

## 2011-12-20 NOTE — Telephone Encounter (Signed)
Pt states she needs referral to a specialist for her TMJ. She states she is biting her tongue in her sleep. She is also still having sinus headaches and would like to see a specialist for that as well.

## 2011-12-20 NOTE — Telephone Encounter (Signed)
Referral entered  

## 2011-12-21 ENCOUNTER — Telehealth: Payer: Self-pay | Admitting: Internal Medicine

## 2011-12-21 DIAGNOSIS — M26609 Unspecified temporomandibular joint disorder, unspecified side: Secondary | ICD-10-CM

## 2011-12-21 NOTE — Telephone Encounter (Signed)
In reference to patient's referral request for TMJ, the referral entered is to ENT.  They do not see, nor treat, patient's with this problem.  Please remove current referral, needs to be an Oral Surgery please.  Oral surgery offices to not participate with insurance, but they will file as a courtesy to patients.  Patients are responsible for any up front costs.  I have left patient a voicemail to return my call so I may inform her.

## 2011-12-26 NOTE — Telephone Encounter (Signed)
Referral changed & entered.

## 2012-01-03 ENCOUNTER — Telehealth: Payer: Self-pay | Admitting: Internal Medicine

## 2012-01-03 DIAGNOSIS — J329 Chronic sinusitis, unspecified: Secondary | ICD-10-CM

## 2012-01-03 NOTE — Telephone Encounter (Signed)
Pt called to check on the status of her referral. She states she is still having headaches and needs an appt ASAP.

## 2012-01-03 NOTE — Telephone Encounter (Signed)
Referral entered  

## 2012-01-03 NOTE — Telephone Encounter (Signed)
Patient calling with a new reason to be referred to ENT.  (see 12/20/11 phone note) Previously a referral was entered to ENT, but for TMJ, and they do not see patients for this problem, they send to Oral surgery.  Patient still wants referral to ENT, but for Sinus problems.  She states for years she has been having sinus pressure, congestion, recurrent sinus infections, causing headaches, tried multiple OTC medications without relief.  If approved, a new ENT referral will need to be entered please.

## 2012-01-03 NOTE — Telephone Encounter (Signed)
Okay to enter a new ENT referral, she also could be referred to Dr. Maple Hudson, our allergy specialist.  Patient's choice

## 2012-03-25 ENCOUNTER — Other Ambulatory Visit: Payer: Self-pay | Admitting: Obstetrics and Gynecology

## 2012-03-25 DIAGNOSIS — Z1231 Encounter for screening mammogram for malignant neoplasm of breast: Secondary | ICD-10-CM

## 2012-04-29 ENCOUNTER — Ambulatory Visit
Admission: RE | Admit: 2012-04-29 | Discharge: 2012-04-29 | Disposition: A | Discharge status: Home / Self Care | Payer: BC Managed Care – PPO | Source: Ambulatory Visit | Attending: Obstetrics and Gynecology | Admitting: Obstetrics and Gynecology

## 2012-04-29 DIAGNOSIS — Z1231 Encounter for screening mammogram for malignant neoplasm of breast: Secondary | ICD-10-CM

## 2012-05-07 ENCOUNTER — Ambulatory Visit (INDEPENDENT_AMBULATORY_CARE_PROVIDER_SITE_OTHER): Payer: BC Managed Care – PPO | Admitting: Family

## 2012-05-07 VITALS — BP 110/62 | HR 88 | Temp 98.2°F | Wt 166.0 lb

## 2012-05-07 DIAGNOSIS — J329 Chronic sinusitis, unspecified: Secondary | ICD-10-CM

## 2012-05-07 MED ORDER — AMOXICILLIN-POT CLAVULANATE 875-125 MG PO TABS: 1.0000 | ORAL_TABLET | Freq: Two times a day (BID) | ORAL | Status: DC

## 2012-05-07 MED ORDER — FLUCONAZOLE 150 MG PO TABS: ORAL_TABLET | ORAL | Status: DC

## 2012-05-07 NOTE — Patient Instructions (Addendum)

## 2012-05-07 NOTE — Progress Notes (Signed)
Subjective:    Patient ID: Morgan Long, female    DOB: 31-May-1971, 41 y.o.   MRN: [id number]  HPI  Morgan Long is a 41 yr old female who presents today with chief complaint of sinus congestion. Congestion has been present for 2 weeks.  Until recently drainage has been clear.  This am she had brown/yellow sputum.  She denies associated fever.  Tried mucinex, zyrtec without improvement.  Reports associated headaches.  Using ibuprofen for headaches.     Review of Systems    see HPI  Past Medical History  Diagnosis Date  . Endometriosis   . Headache     occasional, dx w/ Migraines before, Topamax helps  . TMJ pain dysfunction syndrome     occasional  . Allergic rhinitis     History   Social History  . Marital Status: Single    Spouse Name: N/A    Number of Children: 0  . Years of Education: N/A   Occupational History  . Sports coach, going to school, case manager for  mental health    Social History Main Topics  . Smoking status: Never Smoker   . Smokeless tobacco: Never Used  . Alcohol Use: Yes     Comment: socially   . Drug Use: No  . Sexually Active: No   Other Topics Concern  . Not on file   Social History Narrative   Household:sister and her 3 kids----Diet:eating healthier again----Exercise: routinely---    Past Surgical History  Procedure Date  . Fallopian tube removed     Left  . Breast enhancement surgery 2006    Family History  Problem Relation Age of Onset  . Breast cancer      3 aunts dx age 51s  . Colon cancer Neg Hx   . Heart attack Neg Hx   . Diabetes      grandmother  . Hypertension      several family members  . Throat cancer      M, uncle     Allergies  Allergen Reactions  . Codeine Nausea Only    Current Outpatient Prescriptions on File Prior to Visit  Medication Sig Dispense Refill  . cetirizine (ZYRTEC) 10 MG tablet Take 10 mg by mouth daily.        Marland Kitchen etonogestrel-ethinyl estradiol (NUVARING) 0.12-0.015 MG/24HR  vaginal ring Place 1 each vaginally every 28 (twenty-eight) days. Insert vaginally and leave in place for 3 consecutive weeks, then remove for 1 week.       Marland Kitchen guaiFENesin (MUCINEX) 600 MG 12 hr tablet Take 1,200 mg by mouth 2 (two) times daily.        Marland Kitchen guaiFENesin-codeine (ROBITUSSIN AC) 100-10 MG/5ML syrup 1-2 tsp po qhs prn cough  120 mL  0  . ibuprofen (ADVIL,MOTRIN) 800 MG tablet TAKE 1 TABLET BY MOUTH EVERY 8 HOURS AS NEEDED FOR PAIN  30 tablet  0  . mometasone (NASONEX) 50 MCG/ACT nasal spray Place 2 sprays into the nose daily.  17 g  12  . predniSONE (DELTASONE) 10 MG tablet 3 po qd for 3 days then 2 po qd for 3 days the 1 po qd for 3 days  18 tablet  0  . triamcinolone cream (KENALOG) 0.1 % Apply to rash twice daily.  30 g  0    BP 110/62  Pulse 88  Temp 98.2 F (36.8 C)  Wt 166 lb (75.297 kg)  SpO2 99%    Objective:   Physical Exam  Constitutional: She is oriented to person, place, and time. She appears well-developed and well-nourished. No distress.  HENT:  Right Ear: Tympanic membrane and ear canal normal.  Left Ear: Tympanic membrane and ear canal normal.  Mouth/Throat: No oropharyngeal exudate, posterior oropharyngeal edema or posterior oropharyngeal erythema.       + maxillary sinus tenderness to palpation  Cardiovascular: Normal rate and regular rhythm.   No murmur heard. Pulmonary/Chest: Effort normal and breath sounds normal. No respiratory distress. She has no wheezes. She has no rales. She exhibits no tenderness.  Neurological: She is alert and oriented to person, place, and time.  Skin: Skin is warm.  Psychiatric: She has a normal mood and affect. Her behavior is normal. Judgment and thought content normal.          Assessment & Plan:  Recurrent sinusitis- will rx with Augmentin. She is cautioned to use back up birth control as it may interfere with nuvaring. She is requesting referral to ENT due to recurrent sinusitis.  Will arrange.

## 2012-06-19 ENCOUNTER — Telehealth: Payer: Self-pay | Admitting: Internal Medicine

## 2012-06-19 ENCOUNTER — Ambulatory Visit (INDEPENDENT_AMBULATORY_CARE_PROVIDER_SITE_OTHER): Payer: BC Managed Care – PPO | Admitting: Internal Medicine

## 2012-06-19 ENCOUNTER — Encounter: Payer: Self-pay | Admitting: Internal Medicine

## 2012-06-19 VITALS — BP 134/76 | HR 80 | Temp 98.5°F | Wt 171.0 lb

## 2012-06-19 DIAGNOSIS — J40 Bronchitis, not specified as acute or chronic: Secondary | ICD-10-CM

## 2012-06-19 MED ORDER — BENZONATATE 100 MG PO CAPS: 100.0000 mg | ORAL_CAPSULE | Freq: Three times a day (TID) | ORAL | Status: DC | PRN

## 2012-06-19 MED ORDER — FLUCONAZOLE 150 MG PO TABS: 150.0000 mg | ORAL_TABLET | Freq: Once | ORAL | Status: DC

## 2012-06-19 MED ORDER — HYDROCODONE-HOMATROPINE 5-1.5 MG/5ML PO SYRP: 5.0000 mL | ORAL_SOLUTION | Freq: Every evening | ORAL | Status: DC | PRN

## 2012-06-19 MED ORDER — AZITHROMYCIN 250 MG PO TABS: ORAL_TABLET | ORAL | Status: AC

## 2012-06-19 NOTE — Telephone Encounter (Signed)
Done

## 2012-06-19 NOTE — Telephone Encounter (Signed)
Please advise 

## 2012-06-19 NOTE — Telephone Encounter (Signed)
She developed some itching and nausea with codeine, we'll try hydrocodone. I printed a prescription.  Advise patient to stop immediately hydrocodone if she has any reaction to the new medication.

## 2012-06-19 NOTE — Progress Notes (Signed)
  Subjective:    Patient ID: Morgan Long, female    DOB: 1971-05-26, 42 y.o.   MRN: [id number]  HPI Acute visit Symptoms started 3 weeks ago with sinus congestion, nasal discharge. Last week, "congestion went to the chest" and she developed more cough and yellow sputum production.  Past Medical History  Diagnosis Date  . Endometriosis   . Headache     occasional, dx w/ Migraines before, Topamax helps  . TMJ pain dysfunction syndrome     occasional  . Allergic rhinitis    Past Surgical History  Procedure Date  . Fallopian tube removed     Left  . Breast enhancement surgery 2006   History   Social History  . Marital Status: Single    Spouse Name: N/A    Number of Children: 0  . Years of Education: N/A   Occupational History  . Sports coach, going to school, case manager for  mental health    Social History Main Topics  . Smoking status: Never Smoker   . Smokeless tobacco: Never Used  . Alcohol Use: Yes     Comment: socially   . Drug Use: No  . Sexually Active: No   Other Topics Concern  . Not on file   Social History Narrative   Household:sister and her 3 kids----Diet:eating healthier again----Exercise: routinely---    Review of Systems Had subjective fever on and off. No history of asthma, no wheezing. Have some nausea and has vomited 2 times in the last week. No diarrhea.     Objective:   Physical Exam  General -- alert, well-developed HEENT -- TMs normal, throat w/o redness, face symmetric and slightly tender throughout the maxillary and frontal sinuses, slightly worse on the left? Lungs -- normal respiratory effort, no intercostal retractions, no accessory muscle use, and few rhonchi bilaterally. No wheezing or crackles  Heart-- normal rate, regular rhythm, no murmur, and no gallop.    Psych-- Cognition and judgment appear intact. Alert and cooperative with normal attention span and concentration.  not anxious appearing and not depressed  appearing.       Assessment & Plan:

## 2012-06-19 NOTE — Telephone Encounter (Signed)
Tessalon Perles 100 mg one by mouth 3 times a day when necessary cough , #30, no refills. Please delete the hydrocodone prescription and use hydrocodone in her allergies "nausea "

## 2012-06-19 NOTE — Telephone Encounter (Signed)
Spoke to pt & she stated that she has tried hydrocodone before & it caused her to be nausea as well. Pt would like to know if there is something else she can try, if not she has asked that we send the hydrocodone to the pharmacy & she would try it again. Please advise.

## 2012-06-19 NOTE — Assessment & Plan Note (Addendum)
Sx consistent with bronchitis, mild sinusitis? Plan: zpack, Mucinex. (In the past she tried codeine for cough but it caused itching and some nausea) See instructions Also requests Diflucan as she typically gets a yeast infection with antibiotics. Will do.

## 2012-06-19 NOTE — Telephone Encounter (Signed)
pt called stated she spoke with paz about giveing her somethign to help her sleep at night as the coughing keeps her up--please review Pt. Uses [facility] on [address]  Cb# [phone]

## 2012-06-19 NOTE — Patient Instructions (Addendum)
Rest, fluids , tylenol For cough, take Mucinex DM twice a day as needed  Take the antibiotic as prescribed  (zithromax ) Call if no better in few days Call anytime if the symptoms are severe -- If you develop a yeast infection, take Diflucan once a day, one or 2 times

## 2012-06-25 ENCOUNTER — Other Ambulatory Visit: Payer: Self-pay | Admitting: Internal Medicine

## 2012-06-25 MED ORDER — BENZONATATE 100 MG PO CAPS: 100.0000 mg | ORAL_CAPSULE | Freq: Three times a day (TID) | ORAL | Status: DC | PRN

## 2012-06-25 NOTE — Telephone Encounter (Signed)
Continue with Mucinex DM as needed Call in Tessalon Perles 100 mg one by mouth 3 times a day when necessary cough, #30 no refills. No need for further antibiotics at this point.

## 2012-06-25 NOTE — Telephone Encounter (Signed)
Please advise 

## 2012-06-25 NOTE — Telephone Encounter (Signed)
Discussed with pt, refill done.

## 2012-06-25 NOTE — Telephone Encounter (Signed)
pt called was seen 1.14.14---no better still coughing, can she get another ABX  Cb# [phone]

## 2012-07-10 ENCOUNTER — Telehealth: Payer: Self-pay | Admitting: Internal Medicine

## 2012-07-10 ENCOUNTER — Encounter: Payer: Self-pay | Admitting: Internal Medicine

## 2012-07-10 ENCOUNTER — Ambulatory Visit (INDEPENDENT_AMBULATORY_CARE_PROVIDER_SITE_OTHER): Payer: Self-pay | Admitting: Internal Medicine

## 2012-07-10 ENCOUNTER — Ambulatory Visit (INDEPENDENT_AMBULATORY_CARE_PROVIDER_SITE_OTHER)
Admission: RE | Admit: 2012-07-10 | Discharge: 2012-07-10 | Disposition: A | Discharge status: Home / Self Care | Payer: Self-pay | Source: Ambulatory Visit | Attending: Internal Medicine | Admitting: Internal Medicine

## 2012-07-10 VITALS — BP 108/72 | HR 85 | Temp 98.8°F | Wt 173.0 lb

## 2012-07-10 DIAGNOSIS — J40 Bronchitis, not specified as acute or chronic: Secondary | ICD-10-CM

## 2012-07-10 MED ORDER — FLUCONAZOLE 150 MG PO TABS: 150.0000 mg | ORAL_TABLET | Freq: Once | ORAL | Status: DC

## 2012-07-10 MED ORDER — DOXYCYCLINE HYCLATE 100 MG PO TABS: 100.0000 mg | ORAL_TABLET | Freq: Two times a day (BID) | ORAL | Status: DC

## 2012-07-10 NOTE — Assessment & Plan Note (Signed)
Persistent respiratory symptoms, wheezing?Marland Kitchen Intolerant to codeine. DDX includes persistent bronchitis +/- bronchospasm or pneumonia. Plan: Doxycycline, chest x-ray, samples of dulera, see instructions.

## 2012-07-10 NOTE — Telephone Encounter (Signed)
She has an appointment to see me today

## 2012-07-10 NOTE — Telephone Encounter (Signed)
Patient Information:  Caller Name: Crist Infante  Phone: [phone]  Patient: Morgan Long, Morgan Long  Gender: Female  DOB: [birth year]  Age: 42 Years  PCP: Lelon Perla.  Pregnant: No  Office Follow Up:  Does the office need to follow up with this patient?: No  Instructions For The Office: N/A   Symptoms  Reason For Call & Symptoms: Patient calling, completed antibiotics for bronchitis several weeks ago.  Continues to have congestion and a cough with yellow/brown sputum. Also having wheezing.  No fever.  Reviewed Health History In EMR: Yes  Reviewed Medications In EMR: Yes  Reviewed Allergies In EMR: Yes  Reviewed Surgeries / Procedures: Yes  Date of Onset of Symptoms: 06/19/2012  Treatments Tried: Tessalon  Treatments Tried Worked: No OB / GYN:  LMP: 07/08/2012  Guideline(s) Used:  Cough  Disposition Per Guideline:   Go to Office Now  Reason For Disposition Reached:   Wheezing is present  Advice Given:  N/A  Appointment Scheduled:  07/10/2012 14:30:00 Appointment Scheduled Provider:  Willow Ora

## 2012-07-10 NOTE — Patient Instructions (Addendum)
Please get your x-ray at the other [facility]  office located at: [address] [place], across from [facility].  Please go to the basement, this is a walk-in facility, they are open from 8:30 to 5:30 PM. Phone number [phone]. ---- Mucinex DM twice a day as needed for cough Doxycycline for one week Dulera  1 sprays twice a day for 3 weeks (sample). if not better or if symptoms resurface, let me know.

## 2012-07-10 NOTE — Progress Notes (Signed)
  Subjective:    Patient ID: Morgan Long, female    DOB: [birth year], 42 y.o.   MRN: [id number]  HPI Acute visit Since the last time she was here, she took a Z-Pak, had a reaction to codeine and was trying to control her cough with Tessalon Perles. The reason she is here today is because she continue with cough and she is coughing up abundant yellow, brownish sputum. She is concerned about it.  Past Medical History  Diagnosis Date  . Endometriosis   . Headache     occasional, dx w/ Migraines before, Topamax helps  . TMJ pain dysfunction syndrome     occasional  . Allergic rhinitis    Past Surgical History  Procedure Date  . Fallopian tube removed     Left  . Breast enhancement surgery 2006   History   Social History  . Marital Status: Single    Spouse Name: N/A    Number of Children: 0  . Years of Education: N/A   Occupational History  . 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 Smoking status: Never Smoker   . Smokeless tobacco: Never Used  . Alcohol Use: Yes     Comment: socially   . Drug Use: No  . Sexually Active: No   Other Topics Concern  . Not on file   Social History Narrative   Household:sister and her 3 kids----Diet:eating healthier again----Exercise: routinely---    Review of Systems No fever, chills? No chest pain or shortness of breath. No hemoptysis per se  She has noted some rattling in the chest and a question of wheezing. Nose is slightly congested, had a headache few days ago but that is largely resolved.    Objective:   Physical Exam General -- alert, well-developed, NAD.   HEENT -- TMs normal, throat w/o redness, face symmetric and slt tender to palpation at the maxilary and frontal sinuses. Lungs -- normal respiratory effort, no intercostal retractions, no accessory muscle use, and mild large airway conggestion with cough, no wheezing or crackles  Heart-- normal rate,  regular rhythm, no murmur, and no gallop.   Extremities-- no pretibial edema bilaterally Psych-- Cognition and judgment appear intact. Alert and cooperative with normal attention span and concentration.  not anxious appearing and not depressed appearing.      Assessment & Plan:

## 2012-07-11 ENCOUNTER — Encounter: Payer: Self-pay | Admitting: Internal Medicine

## 2012-07-16 ENCOUNTER — Encounter: Payer: Self-pay | Admitting: *Deleted

## 2012-07-20 ENCOUNTER — Other Ambulatory Visit: Payer: Self-pay

## 2012-10-02 ENCOUNTER — Telehealth: Payer: Self-pay | Admitting: Internal Medicine

## 2012-10-02 ENCOUNTER — Encounter: Payer: Self-pay | Admitting: *Deleted

## 2012-10-02 ENCOUNTER — Telehealth: Payer: Self-pay | Admitting: General Practice

## 2012-10-02 NOTE — Telephone Encounter (Signed)
Please write a letter: To Whom It May Concern: Morgan Long Is a patient of this clinic, she has been seen approximately 6 times in the last 14 months with upper respiratory symptoms. If you have further questions, please obtain a release of information before we can discuss any further her medical problems with you.

## 2012-10-02 NOTE — Telephone Encounter (Signed)
Will discuss at OV on Friday.

## 2012-10-02 NOTE — Telephone Encounter (Signed)
Pt called to inform you that additionally because of the mold the frequency and duration of her sinus infections has increased. States she would like this included in a letter you are writing for her?

## 2012-10-02 NOTE — Telephone Encounter (Signed)
Pt made aware the letter is ready. Pt states she will pick it up at her OV on Friday.

## 2012-10-02 NOTE — Telephone Encounter (Signed)
Pt called about seeing if dr Drue Novel could write a letter for her stating how many visits she came in for her a sinus infection because of the mode in her house. thanks

## 2012-10-02 NOTE — Telephone Encounter (Signed)
Please advise 

## 2012-10-04 ENCOUNTER — Telehealth: Payer: Self-pay | Admitting: Internal Medicine

## 2012-10-04 ENCOUNTER — Encounter: Payer: Self-pay | Admitting: *Deleted

## 2012-10-04 ENCOUNTER — Ambulatory Visit (INDEPENDENT_AMBULATORY_CARE_PROVIDER_SITE_OTHER): Payer: BC Managed Care – PPO | Admitting: Family Medicine

## 2012-10-04 ENCOUNTER — Encounter: Payer: Self-pay | Admitting: Family Medicine

## 2012-10-04 VITALS — BP 108/74 | HR 75 | Temp 99.0°F | Wt 171.4 lb

## 2012-10-04 DIAGNOSIS — R059 Cough, unspecified: Secondary | ICD-10-CM

## 2012-10-04 DIAGNOSIS — J019 Acute sinusitis, unspecified: Secondary | ICD-10-CM

## 2012-10-04 DIAGNOSIS — R05 Cough: Secondary | ICD-10-CM

## 2012-10-04 MED ORDER — MOMETASONE FUROATE 50 MCG/ACT NA SUSP: 2.0000 | Freq: Every day | NASAL | Status: DC

## 2012-10-04 MED ORDER — PROMETHAZINE-PHENYLEPHRINE 6.25-5 MG/5ML PO SYRP: 5.0000 mL | ORAL_SOLUTION | ORAL | Status: DC | PRN

## 2012-10-04 MED ORDER — CEFUROXIME AXETIL 500 MG PO TABS: 500.0000 mg | ORAL_TABLET | Freq: Two times a day (BID) | ORAL | Status: AC

## 2012-10-04 NOTE — Progress Notes (Signed)
  Subjective:     Morgan Long is a 42 y.o. female who presents for evaluation of symptoms of a URI, possible sinusitis. Symptoms include congestion, facial pain, low grade fever, nasal congestion, sinus pressure and sore throat. Onset of symptoms was 2 weeks ago, and has been gradually worsening since that time. Treatment to date: antihistamines, decongestants and nasal steroids.  The following portions of the patient's history were reviewed and updated as appropriate: allergies, current medications, past family history, past medical history, past social history, past surgical history and problem list.  Review of Systems Pertinent items are noted in HPI.   Objective:    BP 108/74  Pulse 75  Temp(Src) 99 F (37.2 C) (Oral)  Wt 171 lb 6.4 oz (77.747 kg)  BMI 30.37 kg/m2  SpO2 98% General appearance: alert, cooperative, appears stated age and no distress Ears: normal TM's and external ear canals both ears Nose: green discharge, moderate congestion, turbinates red, swollen, sinus tenderness bilateral Throat: lips, mucosa, and tongue normal; teeth and gums normal Neck: mild anterior cervical adenopathy, supple, symmetrical, trachea midline and thyroid not enlarged, symmetric, no tenderness/mass/nodules Lungs: clear to auscultation bilaterally   Assessment:    sinusitis   Plan:    Ceftin per orders. Nasal steroids per orders. Follow up as needed. f/u prn

## 2012-10-04 NOTE — Telephone Encounter (Signed)
I can't make that statement

## 2012-10-04 NOTE — Patient Instructions (Signed)

## 2012-10-04 NOTE — Telephone Encounter (Signed)
Pt wants to make sure that the letter will have something written about sympothoms could be from exposure to mold.

## 2012-10-07 ENCOUNTER — Encounter: Payer: Self-pay | Admitting: *Deleted

## 2012-10-07 NOTE — Telephone Encounter (Signed)
Pt called back and was concerned that the last letter did not include the appointment for that day. States she would like something to state anything about mold being a possible cause of symptoms.

## 2012-10-07 NOTE — Telephone Encounter (Signed)
lmovm for pt to return call.  

## 2012-10-07 NOTE — Telephone Encounter (Signed)
Spoke to pt & advised her that once again Dr. Drue Novel will not state this in a letter. Pt understood & requested that we go back to 2011-present & document the # of office visit she has come in for these symptoms. Letter printed & pt made aware letter is ready to be picked up at front desk.

## 2012-10-10 ENCOUNTER — Telehealth: Payer: Self-pay | Admitting: Internal Medicine

## 2012-10-10 DIAGNOSIS — Z889 Allergy status to unspecified drugs, medicaments and biological substances status: Secondary | ICD-10-CM

## 2012-10-10 NOTE — Telephone Encounter (Signed)
Please arrange

## 2012-10-10 NOTE — Telephone Encounter (Signed)
Referral entered  

## 2012-10-10 NOTE — Telephone Encounter (Signed)
Patient called requesting referral to an allergist and ENT. She states she believes it's time to get re-tested.

## 2012-10-10 NOTE — Telephone Encounter (Signed)
Ok to enter referral 

## 2012-11-13 ENCOUNTER — Telehealth: Payer: Self-pay | Admitting: Internal Medicine

## 2012-11-13 DIAGNOSIS — J329 Chronic sinusitis, unspecified: Secondary | ICD-10-CM

## 2012-11-13 DIAGNOSIS — J40 Bronchitis, not specified as acute or chronic: Secondary | ICD-10-CM

## 2012-11-13 NOTE — Telephone Encounter (Signed)
Discuss with patient  

## 2012-11-13 NOTE — Telephone Encounter (Signed)
Arrange a referral Dx recurrent sinusitis, bronchitis

## 2012-11-13 NOTE — Telephone Encounter (Signed)
Pt called because she states that we referred her to ear nose and throat specialist but she need to be referred to an allergist. thanks

## 2013-01-03 ENCOUNTER — Encounter (HOSPITAL_BASED_OUTPATIENT_CLINIC_OR_DEPARTMENT_OTHER): Payer: Self-pay | Admitting: *Deleted

## 2013-01-03 ENCOUNTER — Emergency Department (HOSPITAL_BASED_OUTPATIENT_CLINIC_OR_DEPARTMENT_OTHER)
Admission: EM | Admit: 2013-01-03 | Discharge: 2013-01-04 | Disposition: A | Discharge status: Home / Self Care | Payer: BC Managed Care – PPO | Attending: Emergency Medicine | Admitting: Emergency Medicine

## 2013-01-03 DIAGNOSIS — B349 Viral infection, unspecified: Secondary | ICD-10-CM

## 2013-01-03 DIAGNOSIS — Z3202 Encounter for pregnancy test, result negative: Secondary | ICD-10-CM | POA: Insufficient documentation

## 2013-01-03 DIAGNOSIS — J309 Allergic rhinitis, unspecified: Secondary | ICD-10-CM | POA: Insufficient documentation

## 2013-01-03 DIAGNOSIS — Z79899 Other long term (current) drug therapy: Secondary | ICD-10-CM | POA: Insufficient documentation

## 2013-01-03 DIAGNOSIS — Z8739 Personal history of other diseases of the musculoskeletal system and connective tissue: Secondary | ICD-10-CM | POA: Insufficient documentation

## 2013-01-03 DIAGNOSIS — B9789 Other viral agents as the cause of diseases classified elsewhere: Secondary | ICD-10-CM | POA: Insufficient documentation

## 2013-01-03 DIAGNOSIS — Z8742 Personal history of other diseases of the female genital tract: Secondary | ICD-10-CM | POA: Insufficient documentation

## 2013-01-03 LAB — URINALYSIS, ROUTINE W REFLEX MICROSCOPIC
Bilirubin Urine: NEGATIVE
Ketones, ur: NEGATIVE mg/dL
Leukocytes, UA: NEGATIVE
Nitrite: NEGATIVE
Specific Gravity, Urine: 1.014 (ref 1.005–1.030)
Urobilinogen, UA: 0.2 mg/dL (ref 0.0–1.0)
pH: 5.5 (ref 5.0–8.0)

## 2013-01-03 LAB — URINE MICROSCOPIC-ADD ON

## 2013-01-03 LAB — CBC WITH DIFFERENTIAL/PLATELET
Basophils Relative: 0 % (ref 0–1)
Eosinophils Absolute: 0 10*3/uL (ref 0.0–0.7)
Eosinophils Relative: 0 % (ref 0–5)
Hemoglobin: 14.2 g/dL (ref 12.0–15.0)
Lymphs Abs: 0.8 10*3/uL (ref 0.7–4.0)
MCH: 29.5 pg (ref 26.0–34.0)
MCHC: 33.6 g/dL (ref 30.0–36.0)
MCV: 87.7 fL (ref 78.0–100.0)
Monocytes Absolute: 0.5 10*3/uL (ref 0.1–1.0)
Monocytes Relative: 8 % (ref 3–12)
Neutrophils Relative %: 79 % — ABNORMAL HIGH (ref 43–77)
RBC: 4.81 MIL/uL (ref 3.87–5.11)

## 2013-01-03 MED ORDER — METOCLOPRAMIDE HCL 5 MG/ML IJ SOLN
10.0000 mg | Freq: Once | INTRAMUSCULAR | Status: AC
  Administered 2013-01-03: 10 mg via INTRAVENOUS
  Filled 2013-01-03: qty 2

## 2013-01-03 MED ORDER — SODIUM CHLORIDE 0.9 % IV BOLUS (SEPSIS)
1000.0000 mL | Freq: Once | INTRAVENOUS | Status: AC
  Administered 2013-01-03: 1000 mL via INTRAVENOUS

## 2013-01-03 MED ORDER — DIPHENHYDRAMINE HCL 50 MG/ML IJ SOLN
25.0000 mg | Freq: Once | INTRAMUSCULAR | Status: AC
  Administered 2013-01-03: 25 mg via INTRAVENOUS
  Filled 2013-01-03: qty 1

## 2013-01-03 MED ORDER — KETOROLAC TROMETHAMINE 15 MG/ML IJ SOLN
15.0000 mg | Freq: Once | INTRAMUSCULAR | Status: AC
  Administered 2013-01-03: 15 mg via INTRAVENOUS
  Filled 2013-01-03: qty 1

## 2013-01-03 NOTE — ED Notes (Signed)
MD at bedside. 

## 2013-01-03 NOTE — ED Provider Notes (Signed)
CSN: [id number]     Arrival date & time 01/03/13  2201 History     First MD Initiated Contact with Patient 01/03/13 2323     Chief Complaint  Patient presents with  . Headache   (Consider location/radiation/quality/duration/timing/severity/associated sxs/prior Treatment) HPI This 42 year old female with a three-day history of headache, malaise, bodyaches, low-grade fever and feeling cold all the time. This is then accompanied by chest heaviness, abdominal pain greatest in the left upper quadrant, and today acid reflux with nausea, vomiting and diarrhea. She is not aware of any bloody diarrhea. She denies sore throat. She states her headache is similar to past migraines and is moderate to severe.  Past Medical History  Diagnosis Date  . Endometriosis   . Headache(784.0)     occasional, dx w/ Migraines before, Topamax helps  . TMJ pain dysfunction syndrome     occasional  . Allergic rhinitis    Past Surgical History  Procedure Laterality Date  . Fallopian tube removed      Left  . Breast enhancement surgery  2006   Family History  Problem Relation Age of Onset  . Breast cancer      3 aunts dx age 46s  . Colon cancer Neg Hx   . Heart attack Neg Hx   . Diabetes      grandmother  . Hypertension      several family members  . Throat cancer      M, uncle    History  Substance Use Topics  . Smoking status: Never Smoker   . Smokeless tobacco: Never Used  . Alcohol Use: Yes     Comment: socially    OB History   Grav Para Term Preterm Abortions TAB SAB Ect Mult Living                 Review of Systems  All other systems reviewed and are negative.    Allergies  Codeine and Hydrocodone  Home Medications   Current Outpatient Rx  Name  Route  Sig  Dispense  Refill  . cetirizine (ZYRTEC) 10 MG tablet   Oral   Take 10 mg by mouth daily.           Marland Kitchen etonogestrel-ethinyl estradiol (NUVARING) 0.12-0.015 MG/24HR vaginal ring   Vaginal   Place 1 each vaginally every  28 (twenty-eight) days. Insert vaginally and leave in place for 3 consecutive weeks, then remove for 1 week.          Marland Kitchen ibuprofen (ADVIL,MOTRIN) 800 MG tablet      TAKE 1 TABLET BY MOUTH EVERY 8 HOURS AS NEEDED FOR PAIN   30 tablet   0   . EXPIRED: mometasone (NASONEX) 50 MCG/ACT nasal spray   Nasal   Place 2 sprays into the nose daily.   17 g   12   . mometasone (NASONEX) 50 MCG/ACT nasal spray   Nasal   Place 2 sprays into the nose daily.   17 g   12   . promethazine-phenylephrine (PROMETHAZINE VC) 6.25-5 MG/5ML SYRP   Oral   Take 5 mLs by mouth every 4 (four) hours as needed.   280 mL   0   . triamcinolone cream (KENALOG) 0.1 %      Apply to rash twice daily.   30 g   0    BP 99/59  Pulse 76  Temp(Src) 99.5 F (37.5 C) (Oral)  Resp 18  Ht 5\' 3"  (1.6 m)  Wt 171 lb (  77.565 kg)  BMI 30.3 kg/m2  SpO2 99%  LMP 12/17/2012  Physical Exam General: Well-developed, well-nourished female in no acute distress; appearance consistent with age of record HENT: normocephalic, atraumatic; no pharyngeal erythema or exudate Eyes: pupils equal round and reactive to light; extraocular muscles intact Neck: supple; mild anterior cervical lymphadenopathy Heart: regular rate and rhythm; no murmurs, rubs or gallops Lungs: clear to auscultation bilaterally Abdomen: soft; nondistended; diffusely tender, most prominent in the left side; no masses or hepatosplenomegaly; bowel sounds present Extremities: No deformity; full range of motion; pulses normal Neurologic: Awake, alert and oriented; motor function intact in all extremities and symmetric; no facial droop Skin: Warm and dry Psychiatric: Tearful    ED Course   Procedures (including critical care time)    MDM   Nursing notes and vitals signs, including pulse oximetry, reviewed.  Summary of this visit's results, reviewed by myself:  Labs:  Results for orders placed during the hospital encounter of 01/03/13 (from the  past 24 hour(s))  URINALYSIS, ROUTINE W REFLEX MICROSCOPIC     Status: Abnormal   Collection Time    01/03/13 10:21 PM      Result Value Range   Color, Urine YELLOW  YELLOW   APPearance CLEAR  CLEAR   Specific Gravity, Urine 1.014  1.005 - 1.030   pH 5.5  5.0 - 8.0   Glucose, UA NEGATIVE  NEGATIVE mg/dL   Hgb urine dipstick SMALL (*) NEGATIVE   Bilirubin Urine NEGATIVE  NEGATIVE   Ketones, ur NEGATIVE  NEGATIVE mg/dL   Protein, ur NEGATIVE  NEGATIVE mg/dL   Urobilinogen, UA 0.2  0.0 - 1.0 mg/dL   Nitrite NEGATIVE  NEGATIVE   Leukocytes, UA NEGATIVE  NEGATIVE  PREGNANCY, URINE     Status: None   Collection Time    01/03/13 10:21 PM      Result Value Range   Preg Test, Ur NEGATIVE  NEGATIVE  URINE MICROSCOPIC-ADD ON     Status: None   Collection Time    01/03/13 10:21 PM      Result Value Range   Squamous Epithelial / LPF RARE  RARE   RBC / HPF 3-6  <3 RBC/hpf   Bacteria, UA RARE  RARE  CBC WITH DIFFERENTIAL     Status: Abnormal   Collection Time    01/03/13 11:40 PM      Result Value Range   WBC 6.5  4.0 - 10.5 K/uL   RBC 4.81  3.87 - 5.11 MIL/uL   Hemoglobin 14.2  12.0 - 15.0 g/dL   HCT 40.9  81.1 - 91.4 %   MCV 87.7  78.0 - 100.0 fL   MCH 29.5  26.0 - 34.0 pg   MCHC 33.6  30.0 - 36.0 g/dL   RDW 78.2  95.6 - 21.3 %   Platelets 286  150 - 400 K/uL   Neutrophils Relative % 79 (*) 43 - 77 %   Neutro Abs 5.1  1.7 - 7.7 K/uL   Lymphocytes Relative 12  12 - 46 %   Lymphs Abs 0.8  0.7 - 4.0 K/uL   Monocytes Relative 8  3 - 12 %   Monocytes Absolute 0.5  0.1 - 1.0 K/uL   Eosinophils Relative 0  0 - 5 %   Eosinophils Absolute 0.0  0.0 - 0.7 K/uL   Basophils Relative 0  0 - 1 %   Basophils Absolute 0.0  0.0 - 0.1 K/uL  MONONUCLEOSIS SCREEN  Status: None   Collection Time    01/03/13 11:40 PM      Result Value Range   Mono Screen NEGATIVE  NEGATIVE  COMPREHENSIVE METABOLIC PANEL     Status: None   Collection Time    01/03/13 11:40 PM      Result Value Range    Sodium 136  135 - 145 mEq/L   Potassium 4.5  3.5 - 5.1 mEq/L   Chloride 101  96 - 112 mEq/L   CO2 23  19 - 32 mEq/L   Glucose, Bld 92  70 - 99 mg/dL   BUN 14  6 - 23 mg/dL   Creatinine, Ser 1.61  0.50 - 1.10 mg/dL   Calcium 9.5  8.4 - 09.6 mg/dL   Total Protein 8.0  6.0 - 8.3 g/dL   Albumin 4.0  3.5 - 5.2 g/dL   AST 24  0 - 37 U/L   ALT 19  0 - 35 U/L   Alkaline Phosphatase 75  39 - 117 U/L   Total Bilirubin 0.3  0.3 - 1.2 mg/dL   GFR calc non Af Amer >90  >90 mL/min   GFR calc Af Amer >90  >90 mL/min   1:14 AM Patient feeling better after IV fluids and medications. A history and exam are consistent with a viral illness.   Hanley Seamen, MD 01/04/13 [phone]

## 2013-01-03 NOTE — ED Notes (Addendum)
Headache, diarrhea, nausea, chills, fever and aching all over.

## 2013-01-03 NOTE — ED Notes (Signed)
Pt reports diffuse body aches, fever, ha.  States she ate some ?bad meat yesterday and symptoms started shortly thereafter.

## 2013-01-04 LAB — COMPREHENSIVE METABOLIC PANEL
ALT: 19 U/L (ref 0–35)
BUN: 14 mg/dL (ref 6–23)
CO2: 23 mEq/L (ref 19–32)
Calcium: 9.5 mg/dL (ref 8.4–10.5)
Creatinine, Ser: 0.7 mg/dL (ref 0.50–1.10)
GFR calc Af Amer: >90 mL/min (ref >90)
GFR calc non Af Amer: >90 mL/min (ref >90)
Glucose, Bld: 92 mg/dL (ref 70–99)
Total Protein: 8 g/dL (ref 6.0–8.3)

## 2013-01-04 MED ORDER — METOCLOPRAMIDE HCL 10 MG PO TABS: 10.0000 mg | ORAL_TABLET | Freq: Four times a day (QID) | ORAL | Status: DC | PRN

## 2013-01-04 MED ORDER — PANTOPRAZOLE SODIUM 40 MG IV SOLR
40.0000 mg | Freq: Once | INTRAVENOUS | Status: AC
  Administered 2013-01-04: 40 mg via INTRAVENOUS
  Filled 2013-01-04: qty 40

## 2013-01-04 MED ORDER — KETOROLAC TROMETHAMINE 15 MG/ML IJ SOLN
INTRAMUSCULAR | Status: AC
  Administered 2013-01-04: 15 mg via INTRAVENOUS
  Filled 2013-01-04: qty 1

## 2013-01-04 MED ORDER — KETOROLAC TROMETHAMINE 15 MG/ML IJ SOLN: 15.0000 mg | Freq: Once | INTRAMUSCULAR | Status: AC

## 2013-01-16 ENCOUNTER — Ambulatory Visit: Payer: BC Managed Care – PPO | Admitting: Family Medicine

## 2013-01-16 DIAGNOSIS — Z0289 Encounter for other administrative examinations: Secondary | ICD-10-CM

## 2013-01-20 ENCOUNTER — Ambulatory Visit (INDEPENDENT_AMBULATORY_CARE_PROVIDER_SITE_OTHER): Payer: BC Managed Care – PPO | Admitting: Internal Medicine

## 2013-01-20 ENCOUNTER — Encounter: Payer: Self-pay | Admitting: Internal Medicine

## 2013-01-20 VITALS — BP 90/60 | HR 88 | Temp 98.5°F | Wt 178.2 lb

## 2013-01-20 DIAGNOSIS — R51 Headache: Secondary | ICD-10-CM

## 2013-01-20 DIAGNOSIS — I959 Hypotension, unspecified: Secondary | ICD-10-CM

## 2013-01-20 MED ORDER — CYCLOBENZAPRINE HCL 10 MG PO TABS: 10.0000 mg | ORAL_TABLET | Freq: Every evening | ORAL | Status: DC | PRN

## 2013-01-20 NOTE — Assessment & Plan Note (Signed)
History of headaches, lately reports has HAs on and off, she thinks related to TMJ. She already talk with her dentist who is trying to refer her to a TMJ specialist. In the meantime I recommend Flexeril at night

## 2013-01-20 NOTE — Progress Notes (Signed)
  Subjective:    Patient ID: Morgan Long, female    DOB: 12-Apr-1971, 42 y.o.   MRN: [id number]  HPI Acute visit Patient is concerned because a few days ago she went to her allergies and her blood pressure was in the 90s. That particular day she felt okay. Today, her BP is 90/60, she feels well although when asked admits to slight dizziness particularly if she stands up. Went to the ER 01-03-13 with sx of a viral illness: CBC, CMP, UA, pregnancy test were negative. She received IV fluids and was discharged home. Medication list is reviewed. She started spironolactone for acne 2 weeks ago, does not take it every day. Started phentermine 4 days ago prescribed elsewhere.  Past Medical History  Diagnosis Date  . Endometriosis   . Headache(784.0)     occasional, dx w/ Migraines before, Topamax helps  . TMJ pain dysfunction syndrome     occasional  . Allergic rhinitis    Past Surgical History  Procedure Laterality Date  . Fallopian tube removed      Left  . Breast enhancement surgery  2006   History   Social History  . Marital Status: Single    Spouse Name: N/A    Number of Children: 0  . Years of Education: N/A   Occupational History  . 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 Smoking status: Never Smoker   . Smokeless tobacco: Never Used  . Alcohol Use: Yes     Comment: socially   . Drug Use: No  . Sexual Activity: No   Other Topics Concern  . Not on file   Social History Narrative   Household:sister and her 3 kids             Review of Systems H/o  migraines, having some Headaches lately. Uses occasional Phenergan. Denies nausea, vomiting, diarrhea or blood in the stools. Periods are monthly, lasting ~3.5 days, they are heavy  only on day 1-2     Objective:   Physical Exam BP 90/60  Pulse 88  Temp(Src) 98.5 F (36.9 C)  Wt 178 lb 3.2 oz (80.831 kg)  BMI 31.57 kg/m2  SpO2 98%  LMP  12/17/2012 General -- alert, well-developed, NAD.  Neck --no thyromegaly Lungs -- normal respiratory effort, no intercostal retractions, no accessory muscle use, and normal breath sounds.  Heart-- normal rate, regular rhythm, no murmur.  Abdomen-- Not distended, Good bowel sounds,soft, non-tender. Extremities-- no pretibial edema bilaterally  Neurologic-- alert & oriented X3. Speech, gait normal.  Psych-- Cognition and judgment appear intact. Alert and cooperative with normal attention span and concentration. not anxious appearing and not depressed appearing.     Assessment & Plan:   Low blood pressure, Chart is reviewed,BP was low  01/03/2013 2 the ER, at her allergist and today. Recent CMP and CBC normal. Plan: Recommend to discontinue spironolactone which she is taking for acne Monitor BPs; If SBP is around 95-100 and she feels well, then she does not have to worry about her BP. Drink plenty of fluids. Followup in one month if needed.

## 2013-01-20 NOTE — Patient Instructions (Addendum)
Stop spironolactone Check the  blood pressure 2 or 3 times a week, be sure it is between 110/60 and 140/85. If it is consistently higher or lower, let me know Come back in 1 month if your BP is not back to normal

## 2013-01-21 ENCOUNTER — Encounter: Payer: Self-pay | Admitting: Internal Medicine

## 2013-03-17 ENCOUNTER — Other Ambulatory Visit: Payer: Self-pay | Admitting: Family Medicine

## 2013-03-19 MED ORDER — IBUPROFEN 800 MG PO TABS: ORAL_TABLET | ORAL | Status: DC

## 2013-03-19 NOTE — Telephone Encounter (Signed)
Last seen 10/04/12 and filled 02/18/11 #30. Please advise      KP

## 2013-03-26 ENCOUNTER — Other Ambulatory Visit: Payer: Self-pay

## 2013-03-26 DIAGNOSIS — Z1231 Encounter for screening mammogram for malignant neoplasm of breast: Secondary | ICD-10-CM

## 2013-03-27 ENCOUNTER — Other Ambulatory Visit: Payer: Self-pay | Admitting: Obstetrics and Gynecology

## 2013-03-28 ENCOUNTER — Other Ambulatory Visit: Payer: Self-pay | Admitting: Obstetrics and Gynecology

## 2013-03-28 DIAGNOSIS — Z803 Family history of malignant neoplasm of breast: Secondary | ICD-10-CM

## 2013-04-10 ENCOUNTER — Other Ambulatory Visit: Payer: Self-pay

## 2013-04-22 ENCOUNTER — Ambulatory Visit: Payer: BC Managed Care – PPO

## 2013-04-24 ENCOUNTER — Ambulatory Visit (INDEPENDENT_AMBULATORY_CARE_PROVIDER_SITE_OTHER): Payer: BC Managed Care – PPO

## 2013-04-24 DIAGNOSIS — Z23 Encounter for immunization: Secondary | ICD-10-CM

## 2013-04-29 ENCOUNTER — Ambulatory Visit (INDEPENDENT_AMBULATORY_CARE_PROVIDER_SITE_OTHER): Payer: BC Managed Care – PPO | Admitting: Internal Medicine

## 2013-04-29 VITALS — BP 106/73 | HR 82 | Temp 98.9°F | Wt 172.0 lb

## 2013-04-29 DIAGNOSIS — J029 Acute pharyngitis, unspecified: Secondary | ICD-10-CM

## 2013-04-29 NOTE — Progress Notes (Signed)
Pre visit review using our clinic review tool, if applicable. No additional management support is needed unless otherwise documented below in the visit note. 

## 2013-04-29 NOTE — Progress Notes (Signed)
  Subjective:    Patient ID: Morgan Long, female    DOB: 1970-12-28, 42 y.o.   MRN: [id number]  HPI Acute visit, concerned about her throat. Feels  like she is "ready to have a sore throat" But no actual pain at this time. She is concerned about STDs, she has a boyfriend, they have not used condoms lately, he has not reported any infection or displayed signs of infection but  she is concerned.  Past Medical History  Diagnosis Date  . Endometriosis   . Headache(784.0)     occasional, dx w/ Migraines before, Topamax helps  . TMJ pain dysfunction syndrome     occasional  . Allergic rhinitis    Past Surgical History  Procedure Laterality Date  . Fallopian tube removed      Left  . Breast enhancement surgery  2006     Review of Systems Denies fever or chills No cough, no heartburn, mild stuffy nose but no postnasal dripping. Sometimes feels like " her throat is closing up" but she denies difficulty breathing or stridor    Objective:   Physical Exam BP 106/73  Pulse 82  Temp(Src) 98.9 F (37.2 C)  Wt 172 lb (78.019 kg)  SpO2 100% General -- alert, well-developed, NAD.  Neck -- No mass, no LAD, no stridor  HEENT-- Not pale. TMs normal, throat symmetric, no redness or discharge. Face symmetric, sinuses not tender to palpation.  No drooling Lungs -- normal respiratory effort, no intercostal retractions, no accessory muscle use, and normal breath sounds.  Heart-- normal rate, regular rhythm, no murmur.  Extremities-- no pretibial edema bilaterally  Psych-- Cognition and judgment appear intact. Cooperative with normal attention span and concentration. No anxious appearing , no depressed appearing.      Assessment & Plan:  Sore throat, C/o sore throat without objective evidence of a infection in that area. For now recommend observation. She is concerned about STDs, strongly encouraged safe sex and the use of condoms. We agreed that she will call if she develops pain, fever  or discharge in the throat. She's also concerned about throat "closing up", exam is benign, recommending observation as well

## 2013-04-30 ENCOUNTER — Encounter: Payer: Self-pay | Admitting: Internal Medicine

## 2013-04-30 ENCOUNTER — Ambulatory Visit
Admission: RE | Admit: 2013-04-30 | Discharge: 2013-04-30 | Disposition: A | Discharge status: Home / Self Care | Payer: BC Managed Care – PPO | Source: Ambulatory Visit

## 2013-04-30 DIAGNOSIS — Z1231 Encounter for screening mammogram for malignant neoplasm of breast: Secondary | ICD-10-CM

## 2013-05-02 ENCOUNTER — Ambulatory Visit (INDEPENDENT_AMBULATORY_CARE_PROVIDER_SITE_OTHER): Payer: BC Managed Care – PPO | Admitting: Internal Medicine

## 2013-05-02 ENCOUNTER — Encounter: Payer: Self-pay | Admitting: Internal Medicine

## 2013-05-02 VITALS — BP 110/73 | HR 90 | Temp 97.9°F | Wt 174.0 lb

## 2013-05-02 DIAGNOSIS — J029 Acute pharyngitis, unspecified: Secondary | ICD-10-CM

## 2013-05-02 MED ORDER — AZELASTINE HCL 0.1 % NA SOLN: 2.0000 | Freq: Two times a day (BID) | NASAL | Status: DC

## 2013-05-02 NOTE — Progress Notes (Signed)
   Subjective:    Patient ID: Morgan Long, female    DOB: 08-17-1970, 42 y.o.   MRN: [id number]  HPI Acute visit She was seen 3 days ago with sore throat, is here because ST got worse today, additionally Has developed bilateral ear ache and some headaches.  Past Medical History  Diagnosis Date  . Endometriosis   . Headache(784.0)     occasional, dx w/ Migraines before, Topamax helps  . TMJ pain dysfunction syndrome     occasional  . Allergic rhinitis    Past Surgical History  Procedure Laterality Date  . Fallopian tube removed      Left  . Breast enhancement surgery  2006     Review of Systems No fever or chills but feels cold. No sneezing, some postnasal dripping. Mild cough as well.     Objective:   Physical Exam  BP 110/73  Pulse 90  Temp(Src) 97.9 F (36.6 C)  Wt 174 lb (78.926 kg)  SpO2 100% General -- alert, well-developed, NAD.  Neck - few B < 1cm  LADs HEENT-- Not pale. TMs normal, throat symmetric, ?slt red, no discharge. Face symmetric, sinuses not tender to palpation. Nose  slt congested. Lungs -- normal respiratory effort, no intercostal retractions, no accessory muscle use, and normal breath sounds.  Extremities-- no pretibial edema bilaterally  Neurologic--  alert & oriented X3. Speech normal, gait normal, strength normal in all extremities.  Psych-- Cognition and judgment appear intact. Cooperative with normal attention span and concentration. No anxious appearing , no depressed appearing.     Assessment & Plan:   Persistent sore throat, Persistent sore throat now with URI symptoms, patient is concerned about STDs. Plan: throat culture an G&C Treat with Astelin Further advise results

## 2013-05-02 NOTE — Patient Instructions (Signed)
In addition to your regular medications, take Astelin twice a day. We are sending two cultures and will let you know the results in few days. Call if not gradually  improving

## 2013-05-02 NOTE — Progress Notes (Signed)
Pre visit review using our clinic review tool, if applicable. No additional management support is needed unless otherwise documented below in the visit note. 

## 2013-05-04 LAB — CULTURE, GROUP A STREP: Organism ID, Bacteria: NORMAL

## 2013-05-05 ENCOUNTER — Other Ambulatory Visit: Payer: Self-pay | Admitting: Obstetrics and Gynecology

## 2013-05-05 DIAGNOSIS — R928 Other abnormal and inconclusive findings on diagnostic imaging of breast: Secondary | ICD-10-CM

## 2013-05-05 LAB — GC/CHLAMYDIA PROBE, AMP (THROAT)
Chlamydia trachomatis RNA: NOT DETECTED
Neisseria gonorrhoeae RNA: NOT DETECTED

## 2013-05-12 ENCOUNTER — Other Ambulatory Visit: Payer: BC Managed Care – PPO

## 2013-05-16 ENCOUNTER — Ambulatory Visit
Admission: RE | Admit: 2013-05-16 | Discharge: 2013-05-16 | Disposition: A | Discharge status: Home / Self Care | Payer: BC Managed Care – PPO | Source: Ambulatory Visit | Attending: Obstetrics and Gynecology | Admitting: Obstetrics and Gynecology

## 2013-05-16 DIAGNOSIS — R928 Other abnormal and inconclusive findings on diagnostic imaging of breast: Secondary | ICD-10-CM

## 2013-05-19 ENCOUNTER — Ambulatory Visit
Admission: RE | Admit: 2013-05-19 | Discharge: 2013-05-19 | Disposition: A | Discharge status: Home / Self Care | Payer: BC Managed Care – PPO | Source: Ambulatory Visit | Attending: Obstetrics and Gynecology | Admitting: Obstetrics and Gynecology

## 2013-05-19 DIAGNOSIS — Z803 Family history of malignant neoplasm of breast: Secondary | ICD-10-CM

## 2013-05-19 MED ORDER — GADOBENATE DIMEGLUMINE 529 MG/ML IV SOLN
15.0000 mL | Freq: Once | INTRAVENOUS | Status: AC | PRN
  Administered 2013-05-19: 15 mL via INTRAVENOUS

## 2013-07-25 ENCOUNTER — Telehealth: Payer: Self-pay

## 2013-07-25 ENCOUNTER — Other Ambulatory Visit: Payer: Self-pay | Admitting: Dermatology

## 2013-07-25 MED ORDER — OSELTAMIVIR PHOSPHATE 75 MG PO CAPS
75.0000 mg | ORAL_CAPSULE | Freq: Every day | ORAL | Status: DC
Start: 2013-07-25 — End: 2013-10-30

## 2013-07-25 NOTE — Telephone Encounter (Signed)
rx sent

## 2013-07-25 NOTE — Addendum Note (Signed)
Addended by: Peggyann Shoals on: 07/25/2013 04:42 PM   Modules accepted: Orders

## 2013-07-25 NOTE — Telephone Encounter (Signed)
The patient called and stated her son was diagnosed with the flu and she is need of an rx of tamiflu.   Callback - [phone]

## 2013-07-25 NOTE — Telephone Encounter (Signed)
Tamiflu 75 mg one by mouth daily #10, no refills

## 2013-09-08 ENCOUNTER — Telehealth: Payer: Self-pay | Admitting: Internal Medicine

## 2013-09-08 DIAGNOSIS — M542 Cervicalgia: Secondary | ICD-10-CM

## 2013-09-08 NOTE — Addendum Note (Signed)
Addended by: Peggyann Shoals on: 09/08/2013 01:17 PM   Modules accepted: Orders

## 2013-09-08 NOTE — Telephone Encounter (Signed)
Sports meds referral-- dx neck pain

## 2013-09-08 NOTE — Telephone Encounter (Signed)
09/08/13  Pt sleep on neck and shoulder wrong last night and is wanting to know if Dr. Larose Kells thinks she should go see a sports medicine or orthopedic doctor.  Pt is having pain and stiffness and does not want to make an appt with Dr. Larose Kells and then have to just go see another doctor.  Pt just wants to be pushed in the right direction.  Please call pt to advise.

## 2013-09-08 NOTE — Telephone Encounter (Signed)
Referral ordered

## 2013-09-11 ENCOUNTER — Other Ambulatory Visit (INDEPENDENT_AMBULATORY_CARE_PROVIDER_SITE_OTHER): Payer: BC Managed Care – PPO

## 2013-09-11 ENCOUNTER — Encounter: Payer: Self-pay | Admitting: Family Medicine

## 2013-09-11 ENCOUNTER — Ambulatory Visit (INDEPENDENT_AMBULATORY_CARE_PROVIDER_SITE_OTHER): Payer: BC Managed Care – PPO | Admitting: Family Medicine

## 2013-09-11 VITALS — BP 122/82 | HR 78 | Wt 175.0 lb

## 2013-09-11 DIAGNOSIS — M62830 Muscle spasm of back: Secondary | ICD-10-CM

## 2013-09-11 DIAGNOSIS — M999 Biomechanical lesion, unspecified: Secondary | ICD-10-CM

## 2013-09-11 DIAGNOSIS — M25511 Pain in right shoulder: Secondary | ICD-10-CM

## 2013-09-11 DIAGNOSIS — M25519 Pain in unspecified shoulder: Secondary | ICD-10-CM

## 2013-09-11 DIAGNOSIS — M5380 Other specified dorsopathies, site unspecified: Secondary | ICD-10-CM

## 2013-09-11 MED ORDER — MELOXICAM 15 MG PO TABS: 15.0000 mg | ORAL_TABLET | Freq: Every day | ORAL | Status: DC

## 2013-09-11 MED ORDER — METHYLPREDNISOLONE ACETATE 80 MG/ML IJ SUSP
80.0000 mg | Freq: Once | INTRAMUSCULAR | Status: AC
  Administered 2013-09-11: 80 mg via INTRAMUSCULAR

## 2013-09-11 MED ORDER — CYCLOBENZAPRINE HCL 10 MG PO TABS: 10.0000 mg | ORAL_TABLET | Freq: Three times a day (TID) | ORAL | Status: DC | PRN

## 2013-09-11 MED ORDER — KETOROLAC TROMETHAMINE 60 MG/2ML IM SOLN
60.0000 mg | Freq: Once | INTRAMUSCULAR | Status: AC
  Administered 2013-09-11: 60 mg via INTRAMUSCULAR

## 2013-09-11 NOTE — Progress Notes (Signed)
[facility] [address], [place] [address] Phone: [phone] Subjective:    I'm seeing this patient by the request  of:  Kathlene November, MD   CC: Shoulder and back pain  [id number] Morgan Long is a 43 y.o. female coming in with complaint of children posterior back pain. Patient states that this started approximately 3 days ago. Patient states that she has been working out more frequently and after extreme work out she felt a twinge in her back pointing towards her right scapular region. Patient states since that time it seems to expand encompassing her right shoulder as well as her neck. Patient denies any radiation of the arm or any numbness or weakness. Patient states that she cannot get comfortable no secondary to pain. Patient wants to continue to be able to work out of a regular basis. Patient is never injured her shoulder before but states that she is unable to sleep secondary to the pain and rates the pain a 9/10 in severity.     Past medical history, social, surgical and family history all reviewed in electronic medical record.   Review of Systems: No headache, visual changes, nausea, vomiting, diarrhea, constipation, dizziness, abdominal pain, skin rash, fevers, chills, night sweats, weight loss, swollen lymph nodes, body aches, joint swelling, muscle aches, chest pain, shortness of breath, mood changes.   Objective Blood pressure 122/82, pulse 78, weight 175 lb (79.379 kg), SpO2 99.00%.  General: No apparent distress alert and oriented x3 mood and affect normal, dressed appropriately.  HEENT: Pupils equal, extraocular movements intact  Respiratory: Patient's speak in full sentences and does not appear short of breath  Cardiovascular: No lower extremity edema, non tender, no erythema  Skin: Warm dry intact with no signs of infection or rash on extremities or on axial skeleton.  Abdomen: Soft nontender  Neuro: Cranial nerves II through XII  are intact, neurovascularly intact in all extremities with 2+ DTRs and 2+ pulses.  Lymph: No lymphadenopathy of posterior or anterior cervical chain or axillae bilaterally.  Gait normal with good balance and coordination.  MSK:  Non tender with full range of motion and good stability and symmetric strength and tone of  elbows, wrist, hip, knee and ankles bilaterally.  Shoulder: Right Inspection reveals no abnormalities, atrophy or asymmetry. Palpation is normal with no tenderness over AC joint or bicipital groove. ROM is full in all planes. Rotator cuff strength normal throughout. No signs of impingement with negative Neer and Hawkin's tests, empty can sign. Speeds and Yergason's tests normal. No labral pathology noted with negative Obrien's, negative clunk and good stability. Normal scapular function observed. No painful arc and no drop arm sign. No apprehension sign  MSK US performed of: Right shoulder This study was ordered, performed, and interpreted by Charlann Boxer D.O.  Shoulder:   Supraspinatus:  Appears normal on long and transverse views, no bursal bulge seen with shoulder abduction on impingement view. Infraspinatus:  Appears normal on long and transverse views. Subscapularis:  Appears normal on long and transverse views. Teres Minor:  Appears normal on long and transverse views. AC joint:  Capsule undistended, no geyser sign. Glenohumeral Joint:  Appears normal without effusion. Glenoid Labrum:  Intact without visualized tears. Biceps Tendon:  Appears normal on long and transverse views, no fraying of tendon, tendon located in intertubercular groove, no subluxation with shoulder internal or external rotation. No increased power doppler signal.  Normal ultrasound  Osteopathic findings T 5 extended rotated and  side bent right    Impression and Recommendations:     This case required medical decision making of moderate complexity.

## 2013-09-11 NOTE — Assessment & Plan Note (Signed)
Decision today to treat with OMT was based on Physical Exam  After verbal consent patient was treated with HVLA, ME techniques in thoracic areas  Patient tolerated the procedure well with improvement in symptoms  Patient given exercises, stretches and lifestyle modifications  See medications in patient instructions if given  Patient will follow up in 2-3 weeks

## 2013-09-11 NOTE — Assessment & Plan Note (Signed)
Patient did have a muscle spasm of the back that did respond well to osteopathic manipulation. Patient was also given an injection of Toradol and Depo-Medrol to help with chronic irritation shoes and this area. Patient into a short dose of anti-inflammatories as well as we discussed icing and home exercise program. Patient will try these interventions and come back again in 2-3 weeks. Continue to have pain but responded well to osteopathic manipulation we may continue this intervention.

## 2013-09-11 NOTE — Patient Instructions (Signed)
Very nice to meet you Try exercises most days a of the week.  Ice 20 minutes 2 times a day meloixcam daily for 7 days then as needed Flexeril at night if needed Come back in 2-3 weeks and we can do manipulation.

## 2013-09-16 ENCOUNTER — Telehealth: Payer: Self-pay | Admitting: Family Medicine

## 2013-09-16 NOTE — Telephone Encounter (Signed)
Spoke to pt, she is scheduled for an appt 4.15.15 with Dr. Tamala Julian.

## 2013-09-16 NOTE — Telephone Encounter (Signed)
Tried calling pt back, the # given does not work & the # listed on file is no longer her #.

## 2013-09-16 NOTE — Telephone Encounter (Signed)
Patient called and states that she is still experiencing severe shoulder pain. Wants to be advised on what she should do. Says she called yesterday, I do not see any notes in system.

## 2013-09-17 ENCOUNTER — Encounter: Payer: Self-pay | Admitting: Family Medicine

## 2013-09-17 ENCOUNTER — Ambulatory Visit (INDEPENDENT_AMBULATORY_CARE_PROVIDER_SITE_OTHER): Payer: BC Managed Care – PPO | Admitting: Family Medicine

## 2013-09-17 ENCOUNTER — Ambulatory Visit (INDEPENDENT_AMBULATORY_CARE_PROVIDER_SITE_OTHER)
Admission: RE | Admit: 2013-09-17 | Discharge: 2013-09-17 | Disposition: A | Discharge status: Home / Self Care | Payer: BC Managed Care – PPO | Source: Ambulatory Visit | Attending: Family Medicine | Admitting: Family Medicine

## 2013-09-17 VITALS — BP 116/82 | HR 78

## 2013-09-17 DIAGNOSIS — M7550 Bursitis of unspecified shoulder: Secondary | ICD-10-CM | POA: Insufficient documentation

## 2013-09-17 DIAGNOSIS — M25519 Pain in unspecified shoulder: Secondary | ICD-10-CM

## 2013-09-17 DIAGNOSIS — S46819A Strain of other muscles, fascia and tendons at shoulder and upper arm level, unspecified arm, initial encounter: Secondary | ICD-10-CM

## 2013-09-17 DIAGNOSIS — M25511 Pain in right shoulder: Secondary | ICD-10-CM

## 2013-09-17 DIAGNOSIS — S43499A Other sprain of unspecified shoulder joint, initial encounter: Secondary | ICD-10-CM

## 2013-09-17 DIAGNOSIS — S43439A Superior glenoid labrum lesion of unspecified shoulder, initial encounter: Secondary | ICD-10-CM

## 2013-09-17 NOTE — Patient Instructions (Signed)
Good to see you The injection should help Try exercises most days of the week starting in 48 hours.  Avoid the wall for now! Ice after activity  Xrays downstairs today.  Come back in 2-3 weeks

## 2013-09-17 NOTE — Assessment & Plan Note (Signed)
Patient had injection today with almost complete resolution of pain. I do see the patient hasn't calcific changes of the labrum that could be corresponding to her pain. I would like to get x-rays of the shoulder as well as the cervical neck to make sure there is no other bony abnormality that could be contributing. Patient did have some mild pain on range of motion of the neck which put cervical radiculopathy into the differential diagnosis. Patient will do icing and we discussed changing her exercise routine for now. Patient will follow up in 2-3 weeks and will try to advance her accordingly.  Spent greater than 25 minutes with patient face-to-face and had greater than 50% of counseling including as described above in assessment and plan.

## 2013-09-17 NOTE — Progress Notes (Signed)
[facility] [address], [place] [address] Phone: [phone] Subjective:    CC: Shoulder followup.  [id number] Morgan Long is a 43 y.o. female coming in for followup of her left shoulder. Patient was seen previously and was diagnosed more with a non-allopathic lesion and muscle imbalances. Patient was having muscle spasm and was given a shot of Toradol as well as Depo-Medrol. Patient did have some moderate improvement for the first 24 hours but then the pain seemed to worsen. Patient states since then Patient did have an ultrasound at previous exam that showed a normal rotator cuff. Patient is still states that most of the pain is on the posterior aspect of the shoulder. Patient states the pain is 9/10 and did seem to wake her up at night. Patient has been trying meloxicam and states that it only takes the edge off. Patient states that she continues to try to work out but is having almost weakness on this side compared to her contralateral side. Patient denies though any numbness. Patient does not remember any true neck injury or any other injury but has started doing much more weight lifting exercises with her body weight on a wall over the course last 3 months.    Past medical history, social, surgical and family history all reviewed in electronic medical record.   Review of Systems: No headache, visual changes, nausea, vomiting, diarrhea, constipation, dizziness, abdominal pain, skin rash, fevers, chills, night sweats, weight loss, swollen lymph nodes, body aches, joint swelling, muscle aches, chest pain, shortness of breath, mood changes.   Objective There were no vitals taken for this visit.  General: No apparent distress alert and oriented x3 mood and affect normal, dressed appropriately.  HEENT: Pupils equal, extraocular movements intact  Respiratory: Patient's speak in full sentences and does not appear short of breath  Cardiovascular: No  lower extremity edema, non tender, no erythema  Skin: Warm dry intact with no signs of infection or rash on extremities or on axial skeleton.  Abdomen: Soft nontender  Neuro: Cranial nerves II through XII are intact, neurovascularly intact in all extremities with 2+ DTRs and 2+ pulses.  Lymph: No lymphadenopathy of posterior or anterior cervical chain or axillae bilaterally.  Gait normal with good balance and coordination.  MSK:  Non tender with full range of motion and good stability and symmetric strength and tone of  elbows, wrist, hip, knee and ankles bilaterally.  Neck: Inspection unremarkable. No palpable stepoffs. Pain with Spurling's maneuver but no radicular symptoms. Full neck range of motion Grip strength and sensation normal in bilateral hands Strength good C4 to T1 distribution No sensory change to C4 to T1 Negative Hoffman sign bilaterally Reflexes normal Shoulder: Right Inspection reveals no abnormalities, atrophy or asymmetry. Palpation is normal with no tenderness over AC joint or bicipital groove. ROM is full in all planes. Rotator cuff strength normal throughout. No signs of impingement with negative Neer and Hawkin's tests, empty can sign. Speeds and Yergason's tests normal.  labral pathology noted with positive Obrien's, negative clunk and good stability. Normal scapular function observed. No painful arc and no drop arm sign. No apprehension sign  MSK US performed of: Right shoulder This study was ordered, performed, and interpreted by Charlann Boxer D.O.  Shoulder:   Supraspinatus:  Appears normal on long and transverse views, no bursal bulge seen with shoulder abduction on impingement view. Infraspinatus:  Appears normal on long and transverse views. Subscapularis:  Appears normal on  long and transverse views. Teres Minor:  Appears normal on long and transverse views. AC joint:  Capsule undistended, no geyser sign. Glenohumeral Joint:  Appears normal without  effusion. Glenoid Labrum:  Questionable tear appreciated of the posterior labrum but no significant hypoechoic changes the patient does have calcific changes.. Biceps Tendon:  Appears normal on long and transverse views, no fraying of tendon, tendon located in intertubercular groove, no subluxation with shoulder internal or external rotation. No increased power doppler signal.  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  Verbal informed consent obtained.  Time-out conducted.  Noted no overlying erythema, induration, or other signs of local infection.  Skin prepped in a sterile fashion.  Local anesthesia: Topical Ethyl chloride.  With sterile technique and under real time ultrasound guidance:  Joint visualized.  23g 1  inch needle inserted posterior approach. Pictures taken for needle placement. Patient did have injection of 2 cc of 1% lidocaine, 2 cc of 0.5% Marcaine, and 1.0 cc of Kenalog 40 mg/dL. Completed without difficulty  Pain immediately resolved suggesting accurate placement of the medication.  Advised to call if fevers/chills, erythema, induration, drainage, or persistent bleeding.  Images permanently stored and available for review in the ultrasound unit.  Impression: Technically successful ultrasound guided injection.     Impression and Recommendations:     This case required medical decision making of moderate complexity.

## 2013-09-25 ENCOUNTER — Ambulatory Visit: Payer: BC Managed Care – PPO | Admitting: Family Medicine

## 2013-10-07 ENCOUNTER — Ambulatory Visit: Payer: BC Managed Care – PPO | Admitting: Family Medicine

## 2013-10-27 ENCOUNTER — Emergency Department (INDEPENDENT_AMBULATORY_CARE_PROVIDER_SITE_OTHER)
Admission: EM | Admit: 2013-10-27 | Discharge: 2013-10-27 | Disposition: A | Discharge status: Home / Self Care | Payer: BC Managed Care – PPO | Source: Home / Self Care | Attending: Family Medicine | Admitting: Family Medicine

## 2013-10-27 ENCOUNTER — Encounter (HOSPITAL_COMMUNITY): Payer: Self-pay | Admitting: Emergency Medicine

## 2013-10-27 DIAGNOSIS — M25519 Pain in unspecified shoulder: Secondary | ICD-10-CM

## 2013-10-27 MED ORDER — METHYLPREDNISOLONE ACETATE 40 MG/ML IJ SUSP
INTRAMUSCULAR | Status: AC
  Filled 2013-10-27: qty 5

## 2013-10-27 MED ORDER — METHYLPREDNISOLONE ACETATE 40 MG/ML IJ SUSP
40.0000 mg | Freq: Once | INTRAMUSCULAR | Status: AC
  Administered 2013-10-27: 40 mg via INTRA_ARTICULAR

## 2013-10-27 NOTE — Discharge Instructions (Signed)
Thank you for coming in today. Follow up with Dr. Tamala Julian.  Do the exercises.   Shoulder Exercises EXERCISES  RANGE OF MOTION (ROM) AND STRETCHING EXERCISES These exercises may help you when beginning to rehabilitate your injury. Your symptoms may resolve with or without further involvement from your physician, physical therapist or athletic trainer. While completing these exercises, remember:   Restoring tissue flexibility helps normal motion to return to the joints. This allows healthier, less painful movement and activity.  An effective stretch should be held for at least 30 seconds.  A stretch should never be painful. You should only feel a gentle lengthening or release in the stretched tissue. ROM - Pendulum  Bend at the waist so that your right / left arm falls away from your body. Support yourself with your opposite hand on a solid surface, such as a table or a countertop.  Your right / left arm should be perpendicular to the ground. If it is not perpendicular, you need to lean over farther. Relax the muscles in your right / left arm and shoulder as much as possible.  Gently sway your hips and trunk so they move your right / left arm without any use of your right / left shoulder muscles.  Progress your movements so that your right / left arm moves side to side, then forward and backward, and finally, both clockwise and counterclockwise.  Complete __________ repetitions in each direction. Many people use this exercise to relieve discomfort in their shoulder as well as to gain range of motion. Repeat __________ times. Complete this exercise __________ times per day. STRETCH  Flexion, Standing  Stand with good posture. With an underhand grip on your right / left hand and an overhand grip on the opposite hand, grasp a broomstick or cane so that your hands are a little more than shoulder-width apart.  Keeping your right / left elbow straight and shoulder muscles relaxed, push the stick  with your opposite hand to raise your right / left arm in front of your body and then overhead. Raise your arm until you feel a stretch in your right / left shoulder, but before you have increased shoulder pain.  Try to avoid shrugging your right / left shoulder as your arm rises by keeping your shoulder blade tucked down and toward your mid-back spine. Hold __________ seconds.  Slowly return to the starting position. Repeat __________ times. Complete this exercise __________ times per day. STRETCH - Internal Rotation  Place your right / left hand behind your back, palm-up.  Throw a towel or belt over your opposite shoulder. Grasp the towel/belt with your right / left hand.  While keeping an upright posture, gently pull up on the towel/belt until you feel a stretch in the front of your right / left shoulder.  Avoid shrugging your right / left shoulder as your arm rises by keeping your shoulder blade tucked down and toward your mid-back spine.  Hold __________. Release the stretch by lowering your opposite hand. Repeat __________ times. Complete this exercise __________ times per day. STRETCH - External Rotation and Abduction  Stagger your stance through a doorframe. It does not matter which foot is forward.  As instructed by your physician, physical therapist or athletic trainer, place your hands:  And forearms above your head and on the door frame.  And forearms at head-height and on the door frame.  At elbow-height and on the door frame.  Keeping your head and chest upright and your stomach muscles  tight to prevent over-extending your low-back, slowly shift your weight onto your front foot until you feel a stretch across your chest and/or in the front of your shoulders.  Hold __________ seconds. Shift your weight to your back foot to release the stretch. Repeat __________ times. Complete this stretch __________ times per day.  STRENGTHENING EXERCISES  These exercises may help you  when beginning to rehabilitate your injury. They may resolve your symptoms with or without further involvement from your physician, physical therapist or athletic trainer. While completing these exercises, remember:   Muscles can gain both the endurance and the strength needed for everyday activities through controlled exercises.  Complete these exercises as instructed by your physician, physical therapist or athletic trainer. Progress the resistance and repetitions only as guided.  You may experience muscle soreness or fatigue, but the pain or discomfort you are trying to eliminate should never worsen during these exercises. If this pain does worsen, stop and make certain you are following the directions exactly. If the pain is still present after adjustments, discontinue the exercise until you can discuss the trouble with your clinician.  If advised by your physician, during your recovery, avoid activity or exercises which involve actions that place your right / left hand or elbow above your head or behind your back or head. These positions stress the tissues which are trying to heal. STRENGTH - Scapular Depression and Adduction  With good posture, sit on a firm chair. Supported your arms in front of you with pillows, arm rests or a table top. Have your elbows in line with the sides of your body.  Gently draw your shoulder blades down and toward your mid-back spine. Gradually increase the tension without tensing the muscles along the top of your shoulders and the back of your neck.  Hold for __________ seconds. Slowly release the tension and relax your muscles completely before completing the next repetition.  After you have practiced this exercise, remove the arm support and complete it in standing as well as sitting. Repeat __________ times. Complete this exercise __________ times per day.  STRENGTH - External Rotators  Secure a rubber exercise band/tubing to a fixed object so that it is at  the same height as your right / left elbow when you are standing or sitting on a firm surface.  Stand or sit so that the secured exercise band/tubing is at your side that is not injured.  Bend your elbow 90 degrees. Place a folded towel or small pillow under your right / left arm so that your elbow is a few inches away from your side.  Keeping the tension on the exercise band/tubing, pull it away from your body, as if pivoting on your elbow. Be sure to keep your body steady so that the movement is only coming from your shoulder rotating.  Hold __________ seconds. Release the tension in a controlled manner as you return to the starting position. Repeat __________ times. Complete this exercise __________ times per day.  STRENGTH - Supraspinatus  Stand or sit with good posture. Grasp a __________ weight or an exercise band/tubing so that your hand is "thumbs-up," like when you shake hands.  Slowly lift your right / left hand from your thigh into the air, traveling about 30 degrees from straight out at your side. Lift your hand to shoulder height or as far as you can without increasing any shoulder pain. Initially, many people do not lift their hands above shoulder height.  Avoid shrugging your right /  left shoulder as your arm rises by keeping your shoulder blade tucked down and toward your mid-back spine.  Hold for __________ seconds. Control the descent of your hand as you slowly return to your starting position. Repeat __________ times. Complete this exercise __________ times per day.  STRENGTH - Shoulder Extensors  Secure a rubber exercise band/tubing so that it is at the height of your shoulders when you are either standing or sitting on a firm arm-less chair.  With a thumbs-up grip, grasp an end of the band/tubing in each hand. Straighten your elbows and lift your hands straight in front of you at shoulder height. Step back away from the secured end of band/tubing until it becomes  tense.  Squeezing your shoulder blades together, pull your hands down to the sides of your thighs. Do not allow your hands to go behind you.  Hold for __________ seconds. Slowly ease the tension on the band/tubing as you reverse the directions and return to the starting position. Repeat __________ times. Complete this exercise __________ times per day.  STRENGTH - Scapular Retractors  Secure a rubber exercise band/tubing so that it is at the height of your shoulders when you are either standing or sitting on a firm arm-less chair.  With a palm-down grip, grasp an end of the band/tubing in each hand. Straighten your elbows and lift your hands straight in front of you at shoulder height. Step back away from the secured end of band/tubing until it becomes tense.  Squeezing your shoulder blades together, draw your elbows back as you bend them. Keep your upper arm lifted away from your body throughout the exercise.  Hold __________ seconds. Slowly ease the tension on the band/tubing as you reverse the directions and return to the starting position. Repeat __________ times. Complete this exercise __________ times per day. STRENGTH  Scapular Depressors  Find a sturdy chair without wheels, such as a from a dining room table.  Keeping your feet on the floor, lift your bottom from the seat and lock your elbows.  Keeping your elbows straight, allow gravity to pull your body weight down. Your shoulders will rise toward your ears.  Raise your body against gravity by drawing your shoulder blades down your back, shortening the distance between your shoulders and ears. Although your feet should always maintain contact with the floor, your feet should progressively support less body weight as you get stronger.  Hold __________ seconds. In a controlled and slow manner, lower your body weight to begin the next repetition. Repeat __________ times. Complete this exercise __________ times per day.  Document  Released: 04/05/2005 Document Revised: 08/14/2011 Document Reviewed: 09/03/2008 [facility] Patient Information 2014 [place], [place].

## 2013-10-27 NOTE — ED Provider Notes (Signed)
Morgan Long is a 43 y.o. female who presents to Urgent Care today for right shoulder pain. Patient has right shoulder pain present for 2-3 days. Symptoms started after she did pushups. She was seen about one month ago for similar pain and was diagnosed with a probable right labrum tear after musculoskeletal ultrasound. She had a glenohumeral corticosteroid injection which helped until recently. She denies any radiating pain weakness or numbness. She has pain in her lateral upper arm and trapezius. The pain is worse ever had motion and with neck motion. She feels well otherwise has not tried any medications for her pain yet.   Past Medical History  Diagnosis Date  . Endometriosis   . Headache(784.0)     occasional, dx w/ Migraines before, Topamax helps  . TMJ pain dysfunction syndrome     occasional  . Allergic rhinitis    History  Substance Use Topics  . Smoking status: Never Smoker   . Smokeless tobacco: Never Used  . Alcohol Use: Yes     Comment: socially    ROS as above Medications: No current facility-administered medications for this encounter.   Current Outpatient Prescriptions  Medication Sig Dispense Refill  . zonisamide (ZONEGRAN) 50 MG capsule Take 50 mg by mouth daily.      Marland Kitchen azelastine (ASTELIN) 137 MCG/SPRAY nasal spray Place 2 sprays into both nostrils 2 (two) times daily.  30 mL  3  . cetirizine (ZYRTEC) 10 MG tablet Take 10 mg by mouth daily.        . cyclobenzaprine (FLEXERIL) 10 MG tablet Take 1 tablet (10 mg total) by mouth 3 (three) times daily as needed for muscle spasms.  30 tablet  0  . doxycycline (MONODOX) 100 MG capsule Take 100 mg by mouth daily.      Marland Kitchen etonogestrel-ethinyl estradiol (NUVARING) 0.12-0.015 MG/24HR vaginal ring Place 1 each vaginally every 28 (twenty-eight) days. Insert vaginally and leave in place for 3 consecutive weeks, then remove for 1 week.       Marland Kitchen ibuprofen (ADVIL,MOTRIN) 800 MG tablet TAKE 1 TABLET BY MOUTH EVERY 8 HOURS AS NEEDED  FOR PAIN  30 tablet  0  . meloxicam (MOBIC) 15 MG tablet Take 1 tablet (15 mg total) by mouth daily.  30 tablet  0  . mometasone (NASONEX) 50 MCG/ACT nasal spray Place 2 sprays into the nose daily.  17 g  12  . Montelukast Sodium (SINGULAIR PO) Take 1 tablet by mouth daily.      Marland Kitchen oseltamivir (TAMIFLU) 75 MG capsule Take 1 capsule (75 mg total) by mouth daily.  10 capsule  0  . phentermine 37.5 MG capsule Take 37.5 mg by mouth daily.      Marland Kitchen triamcinolone cream (KENALOG) 0.1 % Apply to rash twice daily.  30 g  0    Exam:  BP 122/91  Pulse 91  Temp(Src) 98.4 F (36.9 C) (Oral)  Resp 18  SpO2 100% Gen: Well NAD Neck: Nontender spinal midline full range of motion. Pain with right lateral flexion and rotation. Negative Spurling's test. Right shoulder normal-appearing nontender. Abduction range of motion limited to about 110 by pain. External and internal rotation are normal. Positive impingement test Strength is intact Contralateral left shoulder normal-appearing nontender full range of motion normal strength negative impingement testing. Capillary refill sensation and pulses intact distally bilaterally  Subacromial Injection: Right shoulder  Consent obtained and time out performed.  Area cleaned with alcohol.  40Mg  of depomedrol and 36ml of 0.5% marcaine  was injected into the subacromial bursa without complication or bleeding. Patient tolerated the procedure well.     Assessment and Plan: 43 y.o. female with right shoulder pain. Possible labrum tear versus subacromial bursitis. Status post subacromial bursa injection. Patient failed home exercise and  glenohumeral corticosteroid injection. I'm hopeful that the subacromial injection we'll provide enough pain relief so the patient may complete her home exercise program. If not she is to followup with Dr. Anola Gurney sports medicine. At that point an MRI arthrogram may be reasonable.  Discussed warning signs or symptoms. Please see  discharge instructions. Patient expresses understanding.    Gregor Hams, MD 10/27/13 [phone]

## 2013-10-27 NOTE — ED Notes (Signed)
Pt c/o right shoulder pain onset 2 days Reports she has been working out for the past 3 weeks Pain increases w/activity and radiates to neck Alert w/no signs of acute distress.

## 2013-10-28 ENCOUNTER — Ambulatory Visit: Payer: BC Managed Care – PPO | Admitting: Family Medicine

## 2013-10-28 ENCOUNTER — Encounter: Payer: Self-pay | Admitting: Family Medicine

## 2013-10-28 ENCOUNTER — Ambulatory Visit (INDEPENDENT_AMBULATORY_CARE_PROVIDER_SITE_OTHER): Payer: BC Managed Care – PPO | Admitting: Family Medicine

## 2013-10-28 VITALS — BP 126/80 | HR 89 | Ht 63.0 in | Wt 173.0 lb

## 2013-10-28 DIAGNOSIS — M542 Cervicalgia: Secondary | ICD-10-CM

## 2013-10-28 DIAGNOSIS — S43499A Other sprain of unspecified shoulder joint, initial encounter: Secondary | ICD-10-CM

## 2013-10-28 DIAGNOSIS — S46819A Strain of other muscles, fascia and tendons at shoulder and upper arm level, unspecified arm, initial encounter: Secondary | ICD-10-CM

## 2013-10-28 DIAGNOSIS — M5030 Other cervical disc degeneration, unspecified cervical region: Secondary | ICD-10-CM

## 2013-10-28 DIAGNOSIS — S43439A Superior glenoid labrum lesion of unspecified shoulder, initial encounter: Secondary | ICD-10-CM

## 2013-10-28 MED ORDER — MELOXICAM 15 MG PO TABS: 15.0000 mg | ORAL_TABLET | Freq: Every day | ORAL | Status: DC

## 2013-10-28 MED ORDER — GABAPENTIN 100 MG PO CAPS: 100.0000 mg | ORAL_CAPSULE | Freq: Three times a day (TID) | ORAL | Status: DC

## 2013-10-28 MED ORDER — KETOROLAC TROMETHAMINE 60 MG/2ML IM SOLN
60.0000 mg | Freq: Once | INTRAMUSCULAR | Status: AC
  Administered 2013-10-28: 60 mg via INTRAMUSCULAR

## 2013-10-28 MED ORDER — PREDNISONE 50 MG PO TABS: 50.0000 mg | ORAL_TABLET | Freq: Every day | ORAL | Status: DC

## 2013-10-28 NOTE — Assessment & Plan Note (Signed)
As stated above See previous x-rays.

## 2013-10-28 NOTE — Assessment & Plan Note (Signed)
Patient shoulder pain is likely multifactorial. I do think she does have a labral tear that could be starting to give her significant difficulty. Patient did not respond to an interarticular injection and we'll start formal physical therapy. Differential also includes degenerative disc disease of the cervical spine that is causing radiculopathy. Patient does have underlying x-ray but does show osteoarthritic changes. Patient was given home exercise program for the neck and will be sent to formal physical therapy as well. We discussed about over-the-counter medications that could be beneficial. Patient was also given a prescription for prednisone as well as meloxicam and gabapentin. Patient is to try these medications and titrate up as tolerated. Patient will come back again in 3 weeks for further evaluation.  Spent greater than 25 minutes with patient face-to-face and had greater than 50% of counseling including as described above in assessment and plan.

## 2013-10-28 NOTE — Patient Instructions (Addendum)
Good to see you Gabapentin at 100mg  at first then increase to 200 mg in 3 days then 3 days later if still doing well go up to 300mg  nightly.  Physical therapy will be calling.  Continue exercises for shoulder and the new ones for the neck Prednisone daily for 5 days Meloxicam daily for 10 days then as needed Vitamin D 2000 IU daily Turmeric 500mg  twice daily.  Come back in 3 weeks to see how you are doing.

## 2013-10-28 NOTE — Progress Notes (Signed)
  [facility] [address], [place] [address] Phone: [phone] Subjective:    CC: Shoulder followup.  NID:[id number] Morgan Long is a 43 y.o. female coming in for followup of her left shoulder. Patient was seen previously and was given a corticosteroid injection. Patient states that the pain did improve for short amount of time and then started having more pain again. Patient does have a past medical history significant for cervical degenerative disc disease per x-rays ordered by me. Patient was seen in urgent care for continued shoulder pain and was given a subacromial injection the other day. Patient states she continues to have extreme pain mostly in the posterior aspect of the shoulder. Patient states that it does wake her up at night. Patient found it very difficult to do any of her regular activity. Patient states that the subacromial injection given to her by urgent care did not make any significant improvement. Patient states that the pain is not in attendance severity.    Past medical history, social, surgical and family history all reviewed in electronic medical record.   Review of Systems: No headache, visual changes, nausea, vomiting, diarrhea, constipation, dizziness, abdominal pain, skin rash, fevers, chills, night sweats, weight loss, swollen lymph nodes, body aches, joint swelling, muscle aches, chest pain, shortness of breath, mood changes.   Objective Blood pressure 126/80, pulse 89, height 5\' 3"  (1.6 m), weight 173 lb (78.472 kg), SpO2 97.00%.  General: No apparent distress alert and oriented x3 mood and affect normal, dressed appropriately.  HEENT: Pupils equal, extraocular movements intact  Respiratory: Patient's speak in full sentences and does not appear short of breath  Cardiovascular: No lower extremity edema, non tender, no erythema  Skin: Warm dry intact with no signs of infection or rash on extremities or on axial skeleton.    Abdomen: Soft nontender  Neuro: Cranial nerves II through XII are intact, neurovascularly intact in all extremities with 2+ DTRs and 2+ pulses.  Lymph: No lymphadenopathy of posterior or anterior cervical chain or axillae bilaterally.  Gait normal with good balance and coordination.  MSK:  Non tender with full range of motion and good stability and symmetric strength and tone of  elbows, wrist, hip, knee and ankles bilaterally.  Neck: Inspection unremarkable. No palpable stepoffs. Pain with Spurling's maneuver with mild radicular symptoms in the C4 distribution. Mild limitation in side bending bilaterally greater to the right Grip strength and sensation normal in bilateral hands Strength good C4 to T1 distribution No sensory change to C4 to T1 Negative Hoffman sign bilaterally Reflexes normal Shoulder: Right Inspection reveals no abnormalities, atrophy or asymmetry. Palpation is normal with no tenderness over AC joint or bicipital groove. ROM is full in all planes. Rotator cuff strength normal throughout. No signs of impingement with negative Neer and Hawkin's tests, empty can sign. Speeds and Yergason's tests normal.  labral pathology noted with positive Obrien's, negative clunk and good stability. Normal scapular function observed. No painful arc and no drop arm sign. No apprehension sign      Impression and Recommendations:     This case required medical decision making of moderate complexity.

## 2013-10-29 ENCOUNTER — Telehealth: Payer: Self-pay | Admitting: Internal Medicine

## 2013-10-29 ENCOUNTER — Encounter (HOSPITAL_COMMUNITY): Payer: Self-pay | Admitting: Emergency Medicine

## 2013-10-29 DIAGNOSIS — Z79899 Other long term (current) drug therapy: Secondary | ICD-10-CM | POA: Insufficient documentation

## 2013-10-29 DIAGNOSIS — Z8742 Personal history of other diseases of the female genital tract: Secondary | ICD-10-CM | POA: Insufficient documentation

## 2013-10-29 DIAGNOSIS — R110 Nausea: Secondary | ICD-10-CM | POA: Insufficient documentation

## 2013-10-29 DIAGNOSIS — M5412 Radiculopathy, cervical region: Secondary | ICD-10-CM | POA: Insufficient documentation

## 2013-10-29 DIAGNOSIS — Z8709 Personal history of other diseases of the respiratory system: Secondary | ICD-10-CM | POA: Insufficient documentation

## 2013-10-29 DIAGNOSIS — IMO0002 Reserved for concepts with insufficient information to code with codable children: Secondary | ICD-10-CM | POA: Insufficient documentation

## 2013-10-29 DIAGNOSIS — Z3202 Encounter for pregnancy test, result negative: Secondary | ICD-10-CM | POA: Insufficient documentation

## 2013-10-29 DIAGNOSIS — Z8719 Personal history of other diseases of the digestive system: Secondary | ICD-10-CM | POA: Insufficient documentation

## 2013-10-29 LAB — CBC WITH DIFFERENTIAL/PLATELET
Basophils Absolute: 0 10*3/uL (ref 0.0–0.1)
Basophils Relative: 0 % (ref 0–1)
Eosinophils Absolute: 0 10*3/uL (ref 0.0–0.7)
Eosinophils Relative: 0 % (ref 0–5)
HEMATOCRIT: 39.6 % (ref 36.0–46.0)
HEMOGLOBIN: 13 g/dL (ref 12.0–15.0)
LYMPHS PCT: 5 % — AB (ref 12–46)
Lymphs Abs: 0.7 10*3/uL (ref 0.7–4.0)
MCH: 30 pg (ref 26.0–34.0)
MCHC: 32.8 g/dL (ref 30.0–36.0)
MCV: 91.5 fL (ref 78.0–100.0)
MONOS PCT: 1 % — AB (ref 3–12)
Monocytes Absolute: 0.1 10*3/uL (ref 0.1–1.0)
NEUTROS ABS: 12.2 10*3/uL — AB (ref 1.7–7.7)
Neutrophils Relative %: 94 % — ABNORMAL HIGH (ref 43–77)
Platelets: 334 10*3/uL (ref 150–400)
RBC: 4.33 MIL/uL (ref 3.87–5.11)
RDW: 14.2 % (ref 11.5–15.5)
WBC: 12.9 10*3/uL — ABNORMAL HIGH (ref 4.0–10.5)

## 2013-10-29 LAB — I-STAT CHEM 8, ED
BUN: 18 mg/dL (ref 6–23)
CALCIUM ION: 1.13 mmol/L (ref 1.12–1.23)
CHLORIDE: 106 meq/L (ref 96–112)
CREATININE: 0.8 mg/dL (ref 0.50–1.10)
GLUCOSE: 150 mg/dL — AB (ref 70–99)
HCT: 45 % (ref 36.0–46.0)
Hemoglobin: 15.3 g/dL — ABNORMAL HIGH (ref 12.0–15.0)
Potassium: 3.9 mEq/L (ref 3.7–5.3)
Sodium: 141 mEq/L (ref 137–147)
TCO2: 22 mmol/L (ref 0–100)

## 2013-10-29 MED ORDER — ONDANSETRON 4 MG PO TBDP
8.0000 mg | ORAL_TABLET | Freq: Once | ORAL | Status: AC
  Administered 2013-10-29: 8 mg via ORAL
  Filled 2013-10-29: qty 2

## 2013-10-29 NOTE — ED Notes (Signed)
Patient with nausea and right arm pain.  Patient had a tear in shoulder about one month ago.   Patient denies any shortness of breath.  Patient was given Zofran 4mg  by EMS enroute to ED.

## 2013-10-29 NOTE — Telephone Encounter (Signed)
Caller: Tia/Patient; Phone: [phone]; Reason for Call: Pt states she was awaiting call back from office but hasn't heard back yet. States her hand is becoming more numb and she is very concerned. She wonders if she should just go to the ER at this point. Advised pt to go to ER at [facility] now for eval per nursing judgement based on current sxs and prior triage. Agreed to plan.

## 2013-10-29 NOTE — Telephone Encounter (Signed)
Patient Information:  Caller Name: Nalleli  Phone: [phone]  Patient: Morgan Long, Morgan Long  Gender: Female  DOB: [birth year]  Age: 43 Years  PCP: Charlann Boxer  Pregnant: No  Office Follow Up:  Does the office need to follow up with this patient?: Yes  Instructions For The Office: Patient requests to see Dr. Tamala Julian only.  She states she is 15 minutes from the office.  Please follow up with her ASAP, please. She refused to check for appointment at another location.   Symptoms  Reason For Call & Symptoms: Patient reports she was seen earlier this week and when she holds her right hand in a tight fist and squeezes with left, something feels funny in her back.  She also states her right arm feels like it is going to sleep from the forearm down.  She reports her right arm feels tight and hand appears slightly swollen.  Pain in arm rated at 7 of 10 compared to "11" on 10/28/13.  Emergent symptoms ruled out.    She also relates the pulling feels more from the center of her back than from her shoulder area.  Go to Office Now per Nerologic Deficit guideline due to Neurologtic deficit of gradual onset, any of the following: Weakness off face, arm or leg on one side of the body, Numbness of the face, arm or leg on one side of the body.  Reviewed Health History In EMR: Yes  Reviewed Medications In EMR: Yes  Reviewed Allergies In EMR: Yes  Reviewed Surgeries / Procedures: Yes  Date of Onset of Symptoms: 10/29/2013  Treatments Tried: Ice, movement, Rx as ordered on 10/28/13 (does not start Meloxicam until Prednisone complete"  Treatments Tried Worked: No OB / GYN:  LMP: Unknown  Guideline(s) Used:  Neurologic Deficit  Disposition Per Guideline:   Go to Office Now  Reason For Disposition Reached:   Neurologic deficit of gradual onset, ANY of the following:   Weakness of the face, arm, or leg on one side of the body  Numbness of the face, arm, or leg on one side of the body  Loss of speech or garbled  speech  Advice Given:  Call Back If:  Symptoms do not go away within 30 minutes  You become worse.  Patient Will Follow Care Advice:  YES

## 2013-10-30 ENCOUNTER — Emergency Department (HOSPITAL_COMMUNITY)
Admission: EM | Admit: 2013-10-30 | Discharge: 2013-10-30 | Disposition: A | Discharge status: Home / Self Care | Payer: BC Managed Care – PPO | Attending: Emergency Medicine | Admitting: Emergency Medicine

## 2013-10-30 DIAGNOSIS — R110 Nausea: Secondary | ICD-10-CM

## 2013-10-30 DIAGNOSIS — M5412 Radiculopathy, cervical region: Secondary | ICD-10-CM

## 2013-10-30 LAB — URINALYSIS, ROUTINE W REFLEX MICROSCOPIC
Bilirubin Urine: NEGATIVE
Glucose, UA: NEGATIVE mg/dL
HGB URINE DIPSTICK: NEGATIVE
KETONES UR: NEGATIVE mg/dL
Leukocytes, UA: NEGATIVE
Nitrite: NEGATIVE
Protein, ur: NEGATIVE mg/dL
SPECIFIC GRAVITY, URINE: 1.016 (ref 1.005–1.030)
Urobilinogen, UA: 0.2 mg/dL (ref 0.0–1.0)
pH: 6 (ref 5.0–8.0)

## 2013-10-30 LAB — PREGNANCY, URINE: Preg Test, Ur: NEGATIVE

## 2013-10-30 MED ORDER — ONDANSETRON HCL 4 MG/2ML IJ SOLN
4.0000 mg | Freq: Once | INTRAMUSCULAR | Status: AC
  Administered 2013-10-30: 4 mg via INTRAVENOUS
  Filled 2013-10-30: qty 2

## 2013-10-30 NOTE — Discharge Instructions (Signed)
Cervical Radiculopathy  Cervical radiculopathy happens when a nerve in the neck is pinched or bruised by a slipped (herniated) disk or by arthritic changes in the bones of the cervical spine. This can occur due to an injury or as part of the normal aging process. Pressure on the cervical nerves can cause pain or numbness that runs from your neck all the way down into your arm and fingers.  CAUSES   There are many possible causes, including:  · Injury.  · Muscle tightness in the neck from overuse.  · Swollen, painful joints (arthritis).  · Breakdown or degeneration in the bones and joints of the spine (spondylosis) due to aging.  · Bone spurs that may develop near the cervical nerves.  SYMPTOMS   Symptoms include pain, weakness, or numbness in the affected arm and hand. Pain can be severe or irritating. Symptoms may be worse when extending or turning the neck.  DIAGNOSIS   Your caregiver will ask about your symptoms and do a physical exam. He or she may test your strength and reflexes. X-rays, CT scans, and MRI scans may be needed in cases of injury or if the symptoms do not go away after a period of time. Electromyography (EMG) or nerve conduction testing may be done to study how your nerves and muscles are working.  TREATMENT   Your caregiver may recommend certain exercises to help relieve your symptoms. Cervical radiculopathy can, and often does, get better with time and treatment. If your problems continue, treatment options may include:  · Wearing a soft collar for short periods of time.  · Physical therapy to strengthen the neck muscles.  · Medicines, such as nonsteroidal anti-inflammatory drugs (NSAIDs), oral corticosteroids, or spinal injections.  · Surgery. Different types of surgery may be done depending on the cause of your problems.  HOME CARE INSTRUCTIONS   · Put ice on the affected area.  · Put ice in a plastic bag.  · Place a towel between your skin and the bag.  · Leave the ice on for 15-20 minutes,  03-04 times a day or as directed by your caregiver.  · If ice does not help, you can try using heat. Take a warm shower or bath, or use a hot water bottle as directed by your caregiver.  · You may try a gentle neck and shoulder massage.  · Use a flat pillow when you sleep.  · Only take over-the-counter or prescription medicines for pain, discomfort, or fever as directed by your caregiver.  · If physical therapy was prescribed, follow your caregiver's directions.  · If a soft collar was prescribed, use it as directed.  SEEK IMMEDIATE MEDICAL CARE IF:   · Your pain gets much worse and cannot be controlled with medicines.  · You have weakness or numbness in your hand, arm, face, or leg.  · You have a high fever or a stiff, rigid neck.  · You lose bowel or bladder control (incontinence).  · You have trouble with walking, balance, or speaking.  MAKE SURE YOU:   · Understand these instructions.  · Will watch your condition.  · Will get help right away if you are not doing well or get worse.  Document Released: 02/14/2001 Document Revised: 08/14/2011 Document Reviewed: 01/03/2011  [employer]® Patient Information ©2014 [employer].

## 2013-10-30 NOTE — ED Provider Notes (Signed)
CSN: [id number]     Arrival date & time 10/29/13  2101 History   None    Chief Complaint  Patient presents with  . Arm Pain     (Consider location/radiation/quality/duration/timing/severity/associated sxs/prior Treatment) HPI This is a 43 year old female who has had a two-month history of pain in her right shoulder. She describes the pain as being in an L-shape starting at the base of the right side of her neck moving down and then laterally. She states she was told this was a labral tear. She has had relief in the past with a shot of steroids but the pain worsened over the past 3 days. She saw her sports medicine physician and was started on oral steroids. Despite this she developed paresthesias in the right arm yesterday. These paresthesias are not localized to a particular dermatome and are constant. The pain in her shoulder is worse with movement of the shoulder or rotation of her neck to the right. She also has pain at the base of her neck when she moves her head to the right. She is concerned about the numbness is is a new symptom. She has some weakness in her right upper extremity but is not sure if this is actual weakness or due to pain. She is also had nausea and was given Zofran on arrival for this. She denies urinary symptoms.  Past Medical History  Diagnosis Date  . Endometriosis   . Headache(784.0)     occasional, dx w/ Migraines before, Topamax helps  . TMJ pain dysfunction syndrome     occasional  . Allergic rhinitis    Past Surgical History  Procedure Laterality Date  . Fallopian tube removed      Left  . Breast enhancement surgery  2006   Family History  Problem Relation Age of Onset  . Breast cancer      3 aunts dx age 78s  . Colon cancer Neg Hx   . Heart attack Neg Hx   . Diabetes      grandmother  . Hypertension      several family members  . Throat cancer      M, uncle    History  Substance Use Topics  . Smoking status: Never Smoker   . Smokeless  tobacco: Never Used  . Alcohol Use: Yes     Comment: socially    OB History   Grav Para Term Preterm Abortions TAB SAB Ect Mult Living                 Review of Systems  All other systems reviewed and are negative.   Allergies  Codeine and Hydrocodone  Home Medications   Prior to Admission medications   Medication Sig Start Date End Date Taking? Authorizing Provider  azelastine (ASTELIN) 0.1 % nasal spray Place 2 sprays into both nostrils daily as needed for rhinitis. Use in each nostril as directed   Yes Historical Provider, MD  cetirizine (ZYRTEC) 10 MG tablet Take 10 mg by mouth daily.     Yes Historical Provider, MD  etonogestrel-ethinyl estradiol (NUVARING) 0.12-0.015 MG/24HR vaginal ring Place 1 each vaginally every 28 (twenty-eight) days. Insert vaginally and leave in place for 3 consecutive weeks, then remove for 1 week.    Yes Historical Provider, MD  gabapentin (NEURONTIN) 100 MG capsule Take 1 capsule (100 mg total) by mouth 3 (three) times daily. 10/28/13  Yes Lyndal Pulley, DO  gabapentin (NEURONTIN) 800 MG tablet Take 800 mg by mouth every  8 (eight) hours as needed (pain).   Yes Historical Provider, MD  mometasone (NASONEX) 50 MCG/ACT nasal spray Place 2 sprays into the nose daily as needed (allergies).   Yes Historical Provider, MD  Multiple Vitamin (MULTIVITAMIN) LIQD Take 5 mLs by mouth daily.   Yes Historical Provider, MD  Olopatadine HCl (PATADAY) 0.2 % SOLN Place 1 drop into both eyes daily as needed (irration).   Yes Historical Provider, MD  phentermine 37.5 MG capsule Take 37.5 mg by mouth daily.   Yes Historical Provider, MD  predniSONE (DELTASONE) 50 MG tablet Take 1 tablet (50 mg total) by mouth daily. 10/28/13  Yes Lyndal Pulley, DO  triamcinolone cream (KENALOG) 0.1 % Apply 1 application topically 2 (two) times daily as needed (dry patches).   Yes Historical Provider, MD  zonisamide (ZONEGRAN) 100 MG capsule Take 100 mg by mouth every other day.   Yes  Historical Provider, MD   BP 95/71  Pulse 65  Temp(Src) 98.4 F (36.9 C) (Oral)  Resp 20  SpO2 100%  Physical Exam General: Well-developed, well-nourished female in no acute distress; appearance consistent with age of record HENT: normocephalic; atraumatic Eyes: pupils equal, round and reactive to light; extraocular muscles intact Neck: supple; pain in right neck and shoulder on rotation to the right Heart: regular rate and rhythm Lungs: clear to auscultation bilaterally Abdomen: soft; nondistended; nontender; no masses or hepatosplenomegaly; bowel sounds present Extremities: No deformity; full range of motion except right shoulder limited by pain; pulses normal Neurologic: Awake, alert and oriented; motor function intact in all extremities, right upper extremity exam limited by pain; sensation intact over right upper extremity but subjectively altered; no facial droop Skin: Warm and dry Psychiatric: Anxious    ED Course  Procedures (including critical care time)  MDM   Nursing notes and vitals signs, including pulse oximetry, reviewed.  Summary of this visit's results, reviewed by myself:  Labs:  Results for orders placed during the hospital encounter of 10/30/13 (from the past 24 hour(s))  CBC WITH DIFFERENTIAL     Status: Abnormal   Collection Time    10/29/13  9:45 PM      Result Value Ref Range   WBC 12.9 (*) 4.0 - 10.5 K/uL   RBC 4.33  3.87 - 5.11 MIL/uL   Hemoglobin 13.0  12.0 - 15.0 g/dL   HCT 39.6  36.0 - 46.0 %   MCV 91.5  78.0 - 100.0 fL   MCH 30.0  26.0 - 34.0 pg   MCHC 32.8  30.0 - 36.0 g/dL   RDW 14.2  11.5 - 15.5 %   Platelets 334  150 - 400 K/uL   Neutrophils Relative % 94 (*) 43 - 77 %   Neutro Abs 12.2 (*) 1.7 - 7.7 K/uL   Lymphocytes Relative 5 (*) 12 - 46 %   Lymphs Abs 0.7  0.7 - 4.0 K/uL   Monocytes Relative 1 (*) 3 - 12 %   Monocytes Absolute 0.1  0.1 - 1.0 K/uL   Eosinophils Relative 0  0 - 5 %   Eosinophils Absolute 0.0  0.0 - 0.7 K/uL    Basophils Relative 0  0 - 1 %   Basophils Absolute 0.0  0.0 - 0.1 K/uL  I-STAT CHEM 8, ED     Status: Abnormal   Collection Time    10/29/13  9:52 PM      Result Value Ref Range   Sodium 141  137 - 147 mEq/L  Potassium 3.9  3.7 - 5.3 mEq/L   Chloride 106  96 - 112 mEq/L   BUN 18  6 - 23 mg/dL   Creatinine, Ser 0.80  0.50 - 1.10 mg/dL   Glucose, Bld 150 (*) 70 - 99 mg/dL   Calcium, Ion 1.13  1.12 - 1.23 mmol/L   TCO2 22  0 - 100 mmol/L   Hemoglobin 15.3 (*) 12.0 - 15.0 g/dL   HCT 45.0  36.0 - 46.0 %  URINALYSIS, ROUTINE W REFLEX MICROSCOPIC     Status: None   Collection Time    10/30/13  2:15 AM      Result Value Ref Range   Color, Urine YELLOW  YELLOW   APPearance CLEAR  CLEAR   Specific Gravity, Urine 1.016  1.005 - 1.030   pH 6.0  5.0 - 8.0   Glucose, UA NEGATIVE  NEGATIVE mg/dL   Hgb urine dipstick NEGATIVE  NEGATIVE   Bilirubin Urine NEGATIVE  NEGATIVE   Ketones, ur NEGATIVE  NEGATIVE mg/dL   Protein, ur NEGATIVE  NEGATIVE mg/dL   Urobilinogen, UA 0.2  0.0 - 1.0 mg/dL   Nitrite NEGATIVE  NEGATIVE   Leukocytes, UA NEGATIVE  NEGATIVE  PREGNANCY, URINE     Status: None   Collection Time    10/30/13  2:15 AM      Result Value Ref Range   Preg Test, Ur NEGATIVE  NEGATIVE   Will schedule outpatient MRI and refer patient back to Dr. Tamala Julian.    Wynetta Fines, MD 10/30/13 [phone]

## 2013-10-30 NOTE — Telephone Encounter (Signed)
Pt evaluated by ER

## 2013-10-30 NOTE — ED Notes (Signed)
MD at bedside. 

## 2013-10-31 ENCOUNTER — Other Ambulatory Visit: Payer: Self-pay | Admitting: Internal Medicine

## 2013-10-31 DIAGNOSIS — R921 Mammographic calcification found on diagnostic imaging of breast: Secondary | ICD-10-CM

## 2013-11-17 ENCOUNTER — Ambulatory Visit
Admission: RE | Admit: 2013-11-17 | Discharge: 2013-11-17 | Disposition: A | Discharge status: Home / Self Care | Payer: BC Managed Care – PPO | Source: Ambulatory Visit | Attending: Internal Medicine | Admitting: Internal Medicine

## 2013-11-17 DIAGNOSIS — R921 Mammographic calcification found on diagnostic imaging of breast: Secondary | ICD-10-CM

## 2013-11-18 ENCOUNTER — Ambulatory Visit: Payer: BC Managed Care – PPO | Admitting: Family Medicine

## 2013-11-19 ENCOUNTER — Ambulatory Visit (INDEPENDENT_AMBULATORY_CARE_PROVIDER_SITE_OTHER): Payer: BC Managed Care – PPO | Admitting: Family Medicine

## 2013-11-19 ENCOUNTER — Encounter: Payer: Self-pay | Admitting: Family Medicine

## 2013-11-19 VITALS — BP 124/80 | HR 89 | Ht 63.0 in | Wt 173.0 lb

## 2013-11-19 DIAGNOSIS — M5030 Other cervical disc degeneration, unspecified cervical region: Secondary | ICD-10-CM

## 2013-11-19 NOTE — Progress Notes (Signed)
  [facility] [place], [place] [address] Phone: [phone] Subjective:    CC: Shoulder and neck pain followup.  [id number] Morgan Long is a 43 y.o. female coming in for followup of her left shoulder and neck pain. Patient was seen previously and does have what appears to be a labral tear into the left shoulder as well as what appeared to be significant cervical osteoarthritic changes on x-ray previously. Patient was referred to formal physical therapy. Patient did have an exacerbation about 2 weeks ago and had begun emergency department for the pain. Patient states though that she started doing home exercises and taking the gabapentin 200 mg at night on a regular basis and has noticed improvement. Patient x-ray states that she 60% better. Patient still has intermittent numbness going down the right arm but this seems to be less and less frequently. Patient denies any new symptoms.     Past medical history, social, surgical and family history all reviewed in electronic medical record.   Review of Systems: No headache, visual changes, nausea, vomiting, diarrhea, constipation, dizziness, abdominal pain, skin rash, fevers, chills, night sweats, weight loss, swollen lymph nodes, body aches, joint swelling, muscle aches, chest pain, shortness of breath, mood changes.   Objective Blood pressure 124/80, pulse 89, height 5\' 3"  (1.6 m), weight 173 lb (78.472 kg), SpO2 99.00%.  General: No apparent distress alert and oriented x3 mood and affect normal, dressed appropriately.  HEENT: Pupils equal, extraocular movements intact  Respiratory: Patient's speak in full sentences and does not appear short of breath  Cardiovascular: No lower extremity edema, non tender, no erythema  Skin: Warm dry intact with no signs of infection or rash on extremities or on axial skeleton.  Abdomen: Soft nontender  Neuro: Cranial nerves II through XII are intact,  neurovascularly intact in all extremities with 2+ DTRs and 2+ pulses.  Lymph: No lymphadenopathy of posterior or anterior cervical chain or axillae bilaterally.  Gait normal with good balance and coordination.  MSK:  Non tender with full range of motion and good stability and symmetric strength and tone of  elbows, wrist, hip, knee and ankles bilaterally.  Neck: Inspection unremarkable. No palpable stepoffs. Pain with Spurling's maneuver with mild radicular symptoms in the C4 and C5 distribution right side. Patient though does have better range of motion with near full range of motion. Grip strength and sensation normal in bilateral hands Strength good C4 to T1 distribution No sensory change to C4 to T1 Negative Hoffman sign bilaterally Reflexes normal Shoulder: Right Inspection reveals no abnormalities, atrophy or asymmetry. Palpation is normal with no tenderness over AC joint or bicipital groove. ROM is full in all planes. Rotator cuff strength normal throughout. No signs of impingement with negative Neer and Hawkin's tests, empty can sign. Speeds and Yergason's tests normal. Normal scapular function observed. No painful arc and no drop arm sign. No apprehension sign      Impression and Recommendations:     This case required medical decision making of moderate complexity.

## 2013-11-19 NOTE — Patient Instructions (Signed)
It is good to see you  I am glad you are doing better Continue the gabapentin Ice is still your best friend.  Consider glucosamine 1500mg  daily.  Physical therapy I think will be great and ask them about a home traction unit. If worsening numbness or weakness call me. We can always order the MRI if needed.  See me again in 4-6 weeks.

## 2013-11-19 NOTE — Assessment & Plan Note (Signed)
**Note De-Identified  Obfuscation** Name Morgan Long the patient's problem is the cervical neck arthritis that could be given patient cervical radiculopathy. Patient though is improving with conservative therapy and is going to be starting physical therapy this week. I do believe that patient is going to do well with over-the-counter medications as well. We discussed proper lifting techniques and postural exercises that could be beneficial. Patient will continue the home exercises at this time and we will consider a home traction unit. Patient will come back again in 4-6 weeks for further elevation.  Spent greater than 25 minutes with patient face-to-face and had greater than 50% of counseling including as described above in assessment and plan.

## 2013-11-21 ENCOUNTER — Other Ambulatory Visit: Payer: Self-pay | Admitting: Obstetrics and Gynecology

## 2013-12-01 ENCOUNTER — Ambulatory Visit (INDEPENDENT_AMBULATORY_CARE_PROVIDER_SITE_OTHER): Payer: BC Managed Care – PPO | Admitting: Internal Medicine

## 2013-12-01 ENCOUNTER — Encounter: Payer: Self-pay | Admitting: Internal Medicine

## 2013-12-01 VITALS — BP 117/79 | HR 82 | Temp 98.0°F | Wt 169.0 lb

## 2013-12-01 DIAGNOSIS — R109 Unspecified abdominal pain: Secondary | ICD-10-CM

## 2013-12-01 DIAGNOSIS — R1010 Upper abdominal pain, unspecified: Secondary | ICD-10-CM

## 2013-12-01 MED ORDER — OMEPRAZOLE 40 MG PO CPDR: 40.0000 mg | DELAYED_RELEASE_CAPSULE | Freq: Every day | ORAL | Status: DC

## 2013-12-01 NOTE — Progress Notes (Signed)
Subjective:    Patient ID: [name], female    DOB: 1971/06/03, 43 y.o.   MRN: [id number]  DOS:  12/01/2013 Type of  Visit: acute History: One week ago developed upper abdominal discomfort, mostly in the left, described as burning. Occasionally associated with nausea, not actually worse by eating. She is under a lot of stress. She is also followed by a sports medicine, had prednisone last month, not taken any Motrin or similar medications   ROS Denies blood in the stools, stools remain normal appearing No GERD symptoms, dysphagia or odynophagia No dysuria, hematuria vag bleed or  d/c   Past Medical History  Diagnosis Date  . Endometriosis   . Headache(784.0)     occasional, dx w/ Migraines before, Topamax helps  . TMJ pain dysfunction syndrome     occasional  . Allergic rhinitis     Past Surgical History  Procedure Laterality Date  . Fallopian tube removed      Left  . Breast enhancement surgery  2006    History   Social History  . Marital Status: Single    Spouse Name: N/A    Number of Children: 0  . Years of Education: N/A   Occupational History  . 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 Smoking status: Never Smoker   . Smokeless tobacco: Never Used  . Alcohol Use: Yes     Comment: socially   . Drug Use: No  . Sexual Activity: No   Other Topics Concern  . Not on file   Social History Narrative   Household:sister and her 3 kids               Medication List       This list is accurate as of: 12/01/13  5:46 PM.  Always use your most recent med list.               azelastine 0.1 % nasal spray  Commonly known as:  ASTELIN  Place 2 sprays into both nostrils daily as needed for rhinitis. Use in each nostril as directed     cetirizine 10 MG tablet  Commonly known as:  ZYRTEC  Take 10 mg by mouth daily.     etonogestrel-ethinyl estradiol 0.12-0.015 MG/24HR vaginal ring    Commonly known as:  [place] 1 each vaginally every 28 (twenty-eight) days. Insert vaginally and leave in place for 3 consecutive weeks, then remove for 1 week.     gabapentin 100 MG capsule  Commonly known as:  NEURONTIN  Take 1 capsule (100 mg total) by mouth 3 (three) times daily.     mometasone 50 MCG/ACT nasal spray  Commonly known as:  NASONEX  Place 2 sprays into the nose daily as needed (allergies).     multivitamin Liqd  Take 5 mLs by mouth daily.     omeprazole 40 MG capsule  Commonly known as:  PRILOSEC  Take 1 capsule (40 mg total) by mouth daily.     PATADAY 0.2 % Soln  Generic drug:  Olopatadine HCl  Place 1 drop into both eyes daily as needed (irration).     phentermine 37.5 MG capsule  Take 37.5 mg by mouth daily as needed.     triamcinolone cream 0.1 %  Commonly known as:  KENALOG  Apply 1 application topically 2 (two) times daily as needed (dry patches).  zonisamide 100 MG capsule  Commonly known as:  ZONEGRAN  Take 100 mg by mouth every other day.           Objective:   Physical Exam BP 117/79  Pulse 82  Temp(Src) 98 F (36.7 C)  Wt 169 lb (76.658 kg)  SpO2 99%  General -- alert, well-developed, NAD.  HEENT-- Not pale.  Lungs -- normal respiratory effort, no intercostal retractions, no accessory muscle use, and normal breath sounds.  Heart-- normal rate, regular rhythm, no murmur.  Abdomen-- Not distended, good bowel sounds,soft,  Slightly tender without mass or rebound at the upper abdomen , worse at the  epigastric area. No mass,organomegaly.  Extremities-- no pretibial edema bilaterally  Neurologic--  alert & oriented X3. Speech normal, gait appropriate for age, strength symmetric and appropriate for age.  Psych-- Cognition and judgment appear intact. Cooperative with normal attention span and concentration. No anxious or depressed appearing.       Assessment & Plan:   Upper abdominal pain, 43 year old female with  abdominal discomfort, symptoms reminiscent of Her previous gastritis. She's not taken any Motrin or similar medications. Plan: Omeprazole for a month Ultrasound to rule out gallbladder problems Call if no better

## 2013-12-01 NOTE — Patient Instructions (Signed)
Take omeprazole daily before breakfast for 1 month, then stop Call if no better or ir if symptoms increase

## 2013-12-01 NOTE — Progress Notes (Signed)
Pre visit review using our clinic review tool, if applicable. No additional management support is needed unless otherwise documented below in the visit note. 

## 2013-12-12 ENCOUNTER — Ambulatory Visit
Admission: RE | Admit: 2013-12-12 | Discharge: 2013-12-12 | Disposition: A | Discharge status: Home / Self Care | Payer: BC Managed Care – PPO | Source: Ambulatory Visit | Attending: Internal Medicine | Admitting: Internal Medicine

## 2013-12-12 DIAGNOSIS — R1010 Upper abdominal pain, unspecified: Secondary | ICD-10-CM

## 2013-12-30 ENCOUNTER — Encounter: Payer: Self-pay | Admitting: Family Medicine

## 2013-12-30 ENCOUNTER — Ambulatory Visit (INDEPENDENT_AMBULATORY_CARE_PROVIDER_SITE_OTHER): Payer: BC Managed Care – PPO | Admitting: Family Medicine

## 2013-12-30 VITALS — BP 112/82 | HR 92 | Ht 63.0 in | Wt 173.0 lb

## 2013-12-30 DIAGNOSIS — S43431D Superior glenoid labrum lesion of right shoulder, subsequent encounter: Secondary | ICD-10-CM

## 2013-12-30 DIAGNOSIS — Z5189 Encounter for other specified aftercare: Secondary | ICD-10-CM

## 2013-12-30 DIAGNOSIS — M5380 Other specified dorsopathies, site unspecified: Secondary | ICD-10-CM

## 2013-12-30 DIAGNOSIS — S46819A Strain of other muscles, fascia and tendons at shoulder and upper arm level, unspecified arm, initial encounter: Secondary | ICD-10-CM

## 2013-12-30 DIAGNOSIS — M62830 Muscle spasm of back: Secondary | ICD-10-CM

## 2013-12-30 DIAGNOSIS — S43499A Other sprain of unspecified shoulder joint, initial encounter: Secondary | ICD-10-CM

## 2013-12-30 DIAGNOSIS — M5030 Other cervical disc degeneration, unspecified cervical region: Secondary | ICD-10-CM

## 2013-12-30 NOTE — Progress Notes (Signed)
  [facility] [place], [place] [address] Phone: [phone] Subjective:    CC: Shoulder and neck pain followup.  [id number] Lennox A Feider is a 43 y.o. female coming in for followup of her left shoulder and neck pain. Patient was seen previously and does have what appears to be a labral tear into the left shoulder as well as what appeared to be significant cervical osteoarthritic changes on x-ray previously. Patient was referred to formal physical therapy. Patient states though that she started doing home exercises and taking the gabapentin 200 mg at night on a regular basis and has noticed improvement.  Patient states that she does have significant discomfort more in the mid back. Patient states this seems to be new. Not as much in the shoulders as previously. Patient states though that she has not been able to lift secondary to the amount of pain. Patient is not working out regularly.    Past medical history, social, surgical and family history all reviewed in electronic medical record.   Review of Systems: No headache, visual changes, nausea, vomiting, diarrhea, constipation, dizziness, abdominal pain, skin rash, fevers, chills, night sweats, weight loss, swollen lymph nodes, body aches, joint swelling, muscle aches, chest pain, shortness of breath, mood changes.   Objective Blood pressure 112/82, pulse 92, height 5\' 3"  (1.6 m), weight 173 lb (78.472 kg), SpO2 99.00%.  General: No apparent distress alert and oriented x3 mood and affect normal, dressed appropriately.  HEENT: Pupils equal, extraocular movements intact  Respiratory: Patient's speak in full sentences and does not appear short of breath  Cardiovascular: No lower extremity edema, non tender, no erythema  Skin: Warm dry intact with no signs of infection or rash on extremities or on axial skeleton.  Abdomen: Soft nontender  Neuro: Cranial nerves II through XII are intact,  neurovascularly intact in all extremities with 2+ DTRs and 2+ pulses.  Lymph: No lymphadenopathy of posterior or anterior cervical chain or axillae bilaterally.  Gait normal with good balance and coordination.  MSK:  Non tender with full range of motion and good stability and symmetric strength and tone of  elbows, wrist, hip, knee and ankles bilaterally.  Neck: Inspection unremarkable. No palpable stepoffs. Pain with Spurling's maneuver with mild radicular symptoms in the C4 and C5 distribution right side. Mild decreased range of motion the last 5 in all positions. Grip strength and sensation normal in bilateral hands Strength good C4 to T1 distribution No sensory change to C4 to T1 Negative Hoffman sign bilaterally Reflexes normal Shoulder: Right Inspection reveals no abnormalities, atrophy or asymmetry. Palpation is normal with no tenderness over AC joint or bicipital groove. ROM is full in all planes. Rotator cuff strength normal throughout. No signs of impingement with negative Neer and Hawkin's tests, empty can sign. Speeds and Yergason's tests normal. Normal scapular function observed. No painful arc and no drop arm sign. No apprehension sign Mild tenderness of the paraspinal musculature of the thoracic spine bilaterally.     Impression and Recommendations:     This case required medical decision making of moderate complexity.

## 2013-12-30 NOTE — Assessment & Plan Note (Signed)
Discussed again at length. I do feel that further imaging would be necessary with patient continuing to have discomfort. I feel an MRI to further evaluate for degenerative changes of her neck could be beneficial. MRI was ordered today and neck as well as the shoulder. Patient will have imaging done and then patient will come back in one to 2 days. At that time we'll go over the results and we'll discuss for further detail. We discussed in great longevity the proper technique for many different exercises as well as the medications and take a regular basis. Patient showed proper technique as well which I think will be beneficial. We discussed the possibility of formal physical therapy which patient declined. Once again we will await these imaging for further evaluation and treatment.

## 2013-12-30 NOTE — Assessment & Plan Note (Signed)
Patient continues to have muscle spasms. Patient is not responding to conservative therapy. Further imaging is going to be done. In addition this time concern that some of his pain may be secondary to her breast augmentation is causing more stress on her upper back. We may need to discuss this in greater detail.

## 2013-12-30 NOTE — Patient Instructions (Signed)
Good to see you We will get MRi of neck and shoulder and I will call with the results. I will send it in my chart.  Continue the medicines and decide on dose for gabapentin.  Depending on findings we will discuss nitro patch vs. Epidural injection.   Nitroglycerin Protocol   Apply 1/4 nitroglycerin patch to affected area daily.  Change position of patch within the affected area every 24 hours.  You may experience a headache during the first 1-2 weeks of using the patch, these should subside.  If you experience headaches after beginning nitroglycerin patch treatment, you may take your preferred over the counter pain reliever.  Another side effect of the nitroglycerin patch is skin irritation or rash related to patch adhesive.  Please notify our office if you develop more severe headaches or rash, and stop the patch.  Tendon healing with nitroglycerin patch may require 12 to 24 weeks depending on the extent of injury.  Men should not use if taking Viagra, Cialis, or Levitra.   Do not use if you have migraines or rosacea.

## 2013-12-30 NOTE — Assessment & Plan Note (Signed)
Patient did have injection in the shoulder previously. I do feel that further evaluation of this could be also contributing. Patient will have an MRI of her shoulder for further evaluation for the potential for the labral tear that was seen on ultrasound. This was greater than 3 months ago so he'll he may have occurred. I do believe that this could be referred pain from her back. We'll continue to monitor. Once again patient will come back when he 2 days after imaging and we'll discuss further evaluation and treatment.

## 2014-01-07 ENCOUNTER — Ambulatory Visit
Admission: RE | Admit: 2014-01-07 | Discharge: 2014-01-07 | Disposition: A | Discharge status: Home / Self Care | Payer: BC Managed Care – PPO | Source: Ambulatory Visit | Attending: Family Medicine | Admitting: Family Medicine

## 2014-01-07 DIAGNOSIS — S43431D Superior glenoid labrum lesion of right shoulder, subsequent encounter: Secondary | ICD-10-CM

## 2014-01-07 DIAGNOSIS — M5030 Other cervical disc degeneration, unspecified cervical region: Secondary | ICD-10-CM

## 2014-01-15 ENCOUNTER — Encounter: Payer: Self-pay | Admitting: Family Medicine

## 2014-01-21 ENCOUNTER — Telehealth: Payer: Self-pay | Admitting: Internal Medicine

## 2014-01-21 NOTE — Telephone Encounter (Signed)
Caller name: Ciarrah Relation to pt: Call back number:[phone]   Reason for call:  Pt wants to know if we do hormone therapy.  Only wants to speak to RN, would not schedule an apt until she was called back.

## 2014-01-21 NOTE — Telephone Encounter (Signed)
No answer no voice mail  

## 2014-01-22 NOTE — Telephone Encounter (Signed)
No answer no voice mail  

## 2014-01-23 NOTE — Telephone Encounter (Signed)
No answer no voice mail  

## 2014-02-06 ENCOUNTER — Ambulatory Visit (INDEPENDENT_AMBULATORY_CARE_PROVIDER_SITE_OTHER): Payer: BC Managed Care – PPO | Admitting: Family Medicine

## 2014-02-06 ENCOUNTER — Encounter: Payer: Self-pay | Admitting: Family Medicine

## 2014-02-06 VITALS — BP 112/74 | HR 80 | Temp 99.2°F | Wt 170.0 lb

## 2014-02-06 DIAGNOSIS — R5383 Other fatigue: Principal | ICD-10-CM

## 2014-02-06 DIAGNOSIS — L659 Nonscarring hair loss, unspecified: Secondary | ICD-10-CM

## 2014-02-06 DIAGNOSIS — R5381 Other malaise: Secondary | ICD-10-CM

## 2014-02-06 DIAGNOSIS — R319 Hematuria, unspecified: Secondary | ICD-10-CM

## 2014-02-06 LAB — POCT URINALYSIS DIPSTICK
BILIRUBIN UA: NEGATIVE
GLUCOSE UA: NEGATIVE
Ketones, UA: NEGATIVE
LEUKOCYTES UA: NEGATIVE
NITRITE UA: NEGATIVE
PH UA: 5
Spec Grav, UA: 1.015
Urobilinogen, UA: 0.2

## 2014-02-06 LAB — HEPATIC FUNCTION PANEL
ALBUMIN: 3.6 g/dL (ref 3.5–5.2)
ALT: 20 U/L (ref 0–35)
AST: 20 U/L (ref 0–37)
Alkaline Phosphatase: 55 U/L (ref 39–117)
Bilirubin, Direct: 0 mg/dL (ref 0.0–0.3)
Total Bilirubin: 0.3 mg/dL (ref 0.2–1.2)
Total Protein: 7.5 g/dL (ref 6.0–8.3)

## 2014-02-06 LAB — CBC WITH DIFFERENTIAL/PLATELET
BASOS ABS: 0 10*3/uL (ref 0.0–0.1)
Basophils Relative: 0.5 % (ref 0.0–3.0)
EOS ABS: 0 10*3/uL (ref 0.0–0.7)
Eosinophils Relative: 0.2 % (ref 0.0–5.0)
HEMATOCRIT: 39 % (ref 36.0–46.0)
HEMOGLOBIN: 13 g/dL (ref 12.0–15.0)
LYMPHS ABS: 1.6 10*3/uL (ref 0.7–4.0)
Lymphocytes Relative: 18.5 % (ref 12.0–46.0)
MCHC: 33.3 g/dL (ref 30.0–36.0)
MCV: 89.3 fl (ref 78.0–100.0)
Monocytes Absolute: 0.5 10*3/uL (ref 0.1–1.0)
Monocytes Relative: 6.4 % (ref 3.0–12.0)
Neutro Abs: 6.3 10*3/uL (ref 1.4–7.7)
Neutrophils Relative %: 74.4 % (ref 43.0–77.0)
Platelets: 348 10*3/uL (ref 150.0–400.0)
RBC: 4.37 Mil/uL (ref 3.87–5.11)
RDW: 12.8 % (ref 11.5–15.5)
WBC: 8.5 10*3/uL (ref 4.0–10.5)

## 2014-02-06 LAB — BASIC METABOLIC PANEL
BUN: 14 mg/dL (ref 6–23)
CO2: 25 mEq/L (ref 19–32)
Calcium: 9 mg/dL (ref 8.4–10.5)
Chloride: 108 mEq/L (ref 96–112)
Creatinine, Ser: 1 mg/dL (ref 0.4–1.2)
GFR: 76.13 mL/min (ref >60.00)
GLUCOSE: 90 mg/dL (ref 70–99)
POTASSIUM: 4.5 meq/L (ref 3.5–5.1)
SODIUM: 138 meq/L (ref 135–145)

## 2014-02-06 NOTE — Addendum Note (Signed)
Addended by: Harl Bowie on: 02/06/2014 03:40 PM   Modules accepted: Orders

## 2014-02-06 NOTE — Patient Instructions (Signed)
Fatigue Fatigue is a feeling of tiredness, lack of energy, lack of motivation, or feeling tired all the time. Having enough rest, good nutrition, and reducing stress will normally reduce fatigue. Consult your caregiver if it persists. The nature of your fatigue will help your caregiver to find out its cause. The treatment is based on the cause.  CAUSES  There are many causes for fatigue. Most of the time, fatigue can be traced to one or more of your habits or routines. Most causes fit into one or more of three general areas. They are: Lifestyle problems  Sleep disturbances.  Overwork.  Physical exertion.  Unhealthy habits.  Poor eating habits or eating disorders.  Alcohol and/or drug use .  Lack of proper nutrition (malnutrition). Psychological problems  Stress and/or anxiety problems.  Depression.  Grief.  Boredom. Medical Problems or Conditions  Anemia.  Pregnancy.  Thyroid gland problems.  Recovery from major surgery.  Continuous pain.  Emphysema or asthma that is not well controlled  Allergic conditions.  Diabetes.  Infections (such as mononucleosis).  Obesity.  Sleep disorders, such as sleep apnea.  Heart failure or other heart-related problems.  Cancer.  Kidney disease.  Liver disease.  Effects of certain medicines such as antihistamines, cough and cold remedies, prescription pain medicines, heart and blood pressure medicines, drugs used for treatment of cancer, and some antidepressants. SYMPTOMS  The symptoms of fatigue include:   Lack of energy.  Lack of drive (motivation).  Drowsiness.  Feeling of indifference to the surroundings. DIAGNOSIS  The details of how you feel help guide your caregiver in finding out what is causing the fatigue. You will be asked about your present and past health condition. It is important to review all medicines that you take, including prescription and non-prescription items. A thorough exam will be done.  You will be questioned about your feelings, habits, and normal lifestyle. Your caregiver may suggest blood tests, urine tests, or other tests to look for common medical causes of fatigue.  TREATMENT  Fatigue is treated by correcting the underlying cause. For example, if you have continuous pain or depression, treating these causes will improve how you feel. Similarly, adjusting the dose of certain medicines will help in reducing fatigue.  HOME CARE INSTRUCTIONS   Try to get the required amount of good sleep every night.  Eat a healthy and nutritious diet, and drink enough water throughout the day.  Practice ways of relaxing (including yoga or meditation).  Exercise regularly.  Make plans to change situations that cause stress. Act on those plans so that stresses decrease over time. Keep your work and personal routine reasonable.  Avoid street drugs and minimize use of alcohol.  Start taking a daily multivitamin after consulting your caregiver. SEEK MEDICAL CARE IF:   You have persistent tiredness, which cannot be accounted for.  You have fever.  You have unintentional weight loss.  You have headaches.  You have disturbed sleep throughout the night.  You are feeling sad.  You have constipation.  You have dry skin.  You have gained weight.  You are taking any new or different medicines that you suspect are causing fatigue.  You are unable to sleep at night.  You develop any unusual swelling of your legs or other parts of your body. SEEK IMMEDIATE MEDICAL CARE IF:   You are feeling confused.  Your vision is blurred.  You feel faint or pass out.  You develop severe headache.  You develop severe abdominal, pelvic, or   back pain.  You develop chest pain, shortness of breath, or an irregular or fast heartbeat.  You are unable to pass a normal amount of urine.  You develop abnormal bleeding such as bleeding from the rectum or you vomit blood.  You have thoughts  about harming yourself or committing suicide.  You are worried that you might harm someone else. MAKE SURE YOU:   Understand these instructions.  Will watch your condition.  Will get help right away if you are not doing well or get worse. Document Released: 03/19/2007 Document Revised: 08/14/2011 Document Reviewed: 09/23/2013 [facility] Patient Information 2015 [place], [place]. This information is not intended to replace advice given to you by your health care provider. Make sure you discuss any questions you have with your health care provider. Alopecia Areata Alopecia areata is a self-destructing (autoimmune) disease that results in the loss of hair. In this condition your body's immune system attacks the hair follicle. The hair follicle is responsible for growing hair. Hair loss can occur on the scalp and other parts of the body. It usually starts as one or more small, round, smooth patches of hair loss. It occurs in males and females of all ages and races, but usually starts before age 63. The scalp is the most commonly affected area, but the beard or any hair-bearing site can be affected. This type of hair loss does not leave scars where the hair was lost.  Many people with alopecia areata only have a few patches of hair loss. In others, extensive patchy hair loss occurs. In a few people, all scalp hair is lost (alopecia totalis), or hair is lost from the entire scalp and body (alopecia universalis). No matter how widespread the hair loss, the hair follicles remain alive and are ready to resume normal hair production whenever they receive the correct signal. Hair re-growth may occur without treatment and can even restart after years of hair loss.  CAUSES  It is thought that something triggers the immune system to stop hair growth. It is not always known what the cause is. Some people have genetic markers that can increase the chance of developing alopecia areata. Alopecia areata often occurs in  families whose members have had:  Asthma.  Hay fever.  Atopic eczema.  Some autoimmune diseases may also be a trigger, such as:  Thyroid disease.  Diabetes.  Rheumatoid arthritis.  Lupus erythematosus.  Vitiligo.  Pernicious anemia.  Addison's disease. OTHER SYMPTOMS In some people, the nail beds may develop rows of tiny dents (stippling) or the nail beds can become distorted. Other than the hair and nail beds, no other body part is affected.  PROGNOSIS  Alopecia areata is not medically disabling. People with alopecia areata are usually in excellent health. Hair loss can be emotionally difficult. The [employer] has resources available to help individuals and families with alopecia areata. Their goal is to help people with the condition live full, productive lives. There are many successful, well-adjusted, contented people living with Alopecia areata. Alopecia areata can be overcome with:  A positive self image.  Sound medical facts.  The support of others, such as:  Sometimes professional counseling is helpful to develop one's self-confidence and positive self-image. TREATMENT  There is no cure for alopecia areata. There are several available treatments. Treatments are most effective in milder cases. No treatment is effective for everyone. Choice of treatment depends mainly on a person's age and the extent of their hair loss. Alopecia areata occurs in  two forms:   A mild patchy form where less than 50 percent of scalp hair is lost.  An extensive form where greater than 50 percent of scalp hair is lost. These two forms of alopecia areata behave quite differently, and the choice of treatment depends on which form is present. Current treatments do not turn alopecia areata off. They can stimulate the hair follicle to produce hair.  Some medications used to treat mild cases include:  Cortisone injections. The most common treatment is the injection of  cortisone into the bare skin patches. The injections are usually given by a caregiver specializing in skin issues (dermatologist). This caregiver will use a tiny needle to give multiple injections into the skin in and around the bare patches. The injections are repeated once a month. If new hair growth occurs, it is usually visible within 4 weeks. Treatment does not prevent new patches of hair loss from developing. There are few side effects from local cortisone injections. Occasionally, temporary dents (depressions) in the skin result from the local injections, but these dents can fill in by themselves.  Topical minoxidil. Five percent topical minoxidil solution applied twice daily may grow hair in alopecia areata. Scalp, eyebrows, and beard hair may respond. If scalp hair re-grows completely, treatment can be stopped. Response may improve if topical cortisone cream is applied 30 minutes after the minoxidil. Topical minoxidil is safe, easy to use, and does not lower blood pressure in persons with normal blood pressure. Minoxidil can lead to unwanted facial hair growth in some people.  Anthralin cream or ointment. Another treatment is the application of anthralin cream or ointment. Anthralin is a tar-like substance that has been used widely for psoriasis. Anthralin is applied to the bare patches once daily. It is washed off after a short time, usually 30 to 60 minutes later. If new hair growth occurs, it is seen in 8 to 12 weeks. Anthralin can be irritating to the skin. It can cause temporary, brownish discoloration of the treated skin. By using short treatment times, skin irritation and skin staining are reduced without decreasing effectiveness. Care must be taken not to get anthralin in the eyes. Some of the medications used for more extensive cases where there is greater than 50% hair loss include:  Cortisone pills. Cortisone pills are sometimes given for extensive scalp hair loss. Cortisone taken  internally is much stronger than local injections of cortisone into the skin. It is necessary to discuss possible side effects of cortisone pills with your caregiver. In general, however, cortisone pills are used in relatively few patients with alopecia areata due to health risks from prolonged use. Also, hair that has grown is likely to fall out when the cortisone pills are stopped.  Topical minoxidil. See previous explanation under mild, patchy alopecia areata. However, minoxidil is not effective in total loss of scalp hair (alopecia totalis).  Topical immunotherapy. Another method of treating alopecia areata or alopecia totalis/universalis involves producing an allergic rash or allergic contact dermatitis. Chemicals such as diphencyprone (DPCP) or squaric acid dibutyl ester (SADBE) are applied to the scalp to produce an allergic rash which resembles poison oak or ivy. Approximately 40% of patients treated with topical immunotherapy will re-grow scalp hair after about 6 months of treatment. Those who do successfully re-grow scalp hair will need to continue treatment to maintain hair re-growth.  Wigs. For extensive hair loss, a wig can be an important option for some people. Proper attention will make a quality wig look completely natural.  A wig will need to be cut, thinned, and styled. To keep a net base wig from falling off, special double-sided tape can be purchased in beauty supply outlets and fastened to the inside of the wig.  For those with completely bare heads, there are suction caps to which any wig can be attached. There are also entire suction cap wig units. FOR MORE INFORMATION National Alopecia Areata Foundation: https://www.berry.org/ Document Released: 12/25/2003 Document Revised: 08/14/2011 Document Reviewed: 08/11/2013 [facility] Patient Information 2015 [place], [place]. This information is not intended to replace advice given to you by your health care provider. Make sure you discuss any  questions you have with your health care provider.

## 2014-02-06 NOTE — Progress Notes (Signed)
Pre visit review using our clinic review tool, if applicable. No additional management support is needed unless otherwise documented below in the visit note. 

## 2014-02-06 NOTE — Progress Notes (Signed)
   Subjective:    Patient ID: Morgan Long, female    DOB: [birth year], 42 y.o.   MRN: [id number]  HPI Pt is here c/o hair loss and fatigue. She had hormone levels checked at gyn and she was told she was perimenopausal.  She has a bag full of hair at home from where her hair was coming out in brush and shower.    Review of Systems    as above  Objective:   Physical Exam  BP 112/74  Pulse 80  Temp(Src) 99.2 F (37.3 C) (Oral)  Wt 169 lb 15.6 oz (77.1 kg)  SpO2 97%  LMP 12/06/2013 General appearance: alert, cooperative, appears stated age and no distress Nose: Nares normal. Septum midline. Mucosa normal. No drainage or sinus tenderness. Throat: lips, mucosa, and tongue normal; teeth and gums normal Neck: no adenopathy, no carotid bruit, no JVD, supple, symmetrical, trachea midline and thyroid not enlarged, symmetric, no tenderness/mass/nodules Lungs: clear to auscultation bilaterally Heart: S1, S2 normal Extremities: extremities normal, atraumatic, no cyanosis or edema Scalp-- thinning hair     Assessment & Plan:  1. Other malaise and fatigue Early menopause per gyn-- f/u gyn to discuss options with hormones Check labs - Basic metabolic panel - CBC with Differential - Hepatic function panel - POCT urinalysis dipstick - TSH - Vitamin B12 - T3, free - T4, free  2. Hair loss Check labs-- pt requesting referral  - Ambulatory referral to Dermatology

## 2014-02-07 LAB — URINE CULTURE: Colony Count: 2000

## 2014-02-10 LAB — T4, FREE: Free T4: 0.88 ng/dL (ref 0.60–1.60)

## 2014-02-10 LAB — VITAMIN B12

## 2014-02-10 LAB — TSH: TSH: 1.45 u[IU]/mL (ref 0.35–4.50)

## 2014-02-10 LAB — T3, FREE: T3 FREE: 2.9 pg/mL (ref 2.3–4.2)

## 2014-02-12 ENCOUNTER — Telehealth: Payer: Self-pay | Admitting: Family Medicine

## 2014-02-12 NOTE — Telephone Encounter (Signed)
patient aware and voiced understanding       KP

## 2014-02-12 NOTE — Telephone Encounter (Signed)
Notes Recorded by Rosalita Chessman, DO on 02/10/2014 at 9:01 PM Normal

## 2014-02-12 NOTE — Telephone Encounter (Signed)
Caller name: Kinslei Relation to pt: Call back number:[phone]  Reason for call:  Pt would like results from lab test 9/4.  thanks

## 2014-02-18 ENCOUNTER — Telehealth: Payer: Self-pay | Admitting: Family Medicine

## 2014-02-18 NOTE — Telephone Encounter (Signed)
Caller name: xuan Relation to pt: Call back number:[phone]   Reason for call:  Pt wants to know if recent lab work was sent to [employer] doctor.  Dr. Candie Mile, at [facility].  If not, please fax.

## 2014-02-19 NOTE — Telephone Encounter (Signed)
Labs sent to Dr.Grewal.       KP

## 2014-02-27 ENCOUNTER — Ambulatory Visit: Payer: BC Managed Care – PPO

## 2014-02-27 DIAGNOSIS — Z23 Encounter for immunization: Secondary | ICD-10-CM

## 2014-03-16 ENCOUNTER — Encounter: Payer: Self-pay | Admitting: Family Medicine

## 2014-03-16 ENCOUNTER — Telehealth: Payer: Self-pay | Admitting: Family Medicine

## 2014-03-16 DIAGNOSIS — M542 Cervicalgia: Principal | ICD-10-CM

## 2014-03-16 DIAGNOSIS — G8929 Other chronic pain: Secondary | ICD-10-CM

## 2014-03-16 NOTE — Telephone Encounter (Signed)
Patient stated that she is in a lot of pain, and would to know when can she get her cortisone shot.  Please advise

## 2014-03-16 NOTE — Telephone Encounter (Signed)
Spoke to pt, told her the epidural inj has been ordered & sent to [facility]. Pt understood.

## 2014-03-17 ENCOUNTER — Other Ambulatory Visit: Payer: Self-pay | Admitting: Family Medicine

## 2014-03-17 ENCOUNTER — Ambulatory Visit
Admission: RE | Admit: 2014-03-17 | Discharge: 2014-03-17 | Disposition: A | Discharge status: Home / Self Care | Payer: BC Managed Care – PPO | Source: Ambulatory Visit | Attending: Family Medicine | Admitting: Family Medicine

## 2014-03-17 DIAGNOSIS — G8929 Other chronic pain: Secondary | ICD-10-CM

## 2014-03-17 DIAGNOSIS — M542 Cervicalgia: Principal | ICD-10-CM

## 2014-03-17 MED ORDER — IOHEXOL 300 MG/ML  SOLN
1.0000 mL | Freq: Once | INTRAMUSCULAR | Status: AC | PRN
  Administered 2014-03-17: 1 mL via EPIDURAL

## 2014-03-17 MED ORDER — TRIAMCINOLONE ACETONIDE 40 MG/ML IJ SUSP (RADIOLOGY)
60.0000 mg | Freq: Once | INTRAMUSCULAR | Status: AC
  Administered 2014-03-17: 60 mg via EPIDURAL

## 2014-03-17 NOTE — Discharge Instructions (Signed)

## 2014-03-18 ENCOUNTER — Encounter: Payer: Self-pay | Admitting: Family Medicine

## 2014-03-20 ENCOUNTER — Encounter: Payer: Self-pay | Admitting: Family Medicine

## 2014-03-20 ENCOUNTER — Ambulatory Visit (INDEPENDENT_AMBULATORY_CARE_PROVIDER_SITE_OTHER): Payer: BC Managed Care – PPO | Admitting: Family Medicine

## 2014-03-20 VITALS — BP 126/80 | HR 89 | Ht 63.0 in | Wt 171.0 lb

## 2014-03-20 DIAGNOSIS — M9902 Segmental and somatic dysfunction of thoracic region: Secondary | ICD-10-CM

## 2014-03-20 DIAGNOSIS — M999 Biomechanical lesion, unspecified: Secondary | ICD-10-CM

## 2014-03-20 DIAGNOSIS — M7551 Bursitis of right shoulder: Secondary | ICD-10-CM

## 2014-03-20 DIAGNOSIS — M62830 Muscle spasm of back: Secondary | ICD-10-CM

## 2014-03-20 NOTE — Progress Notes (Signed)
[facility] [address] [place], [place] [address] Phone: [phone] Subjective:    CC: Shoulder and neck pain followup.  [id number] Morgan Long is a 43 y.o. female coming in for followup of her left shoulder and neck pain. Patient was seen previously and does have what appears to be a labral tear into the left shoulder as well as what appeared to be significant cervical osteoarthritic changes on x-ray previously. Patient was referred to formal physical therapy.   Patient states though that she started doing home exercises and taking the gabapentin 200 mg at night on a regular basis and was noticing improvement but then stopped. Patient started having significant severe pain mostly in the neck as well as the knee shoulder again. The patient was having and so bad that we did do an epidural steroid injection in the neck for diagnostic as well as therapeutic intervention. Patient states that the neck pain did improve some but unfortunate continued to have the same severe shoulder pain. Patient's MRI of the shoulder previously did show some mild subacromial bursitis and mild tendinopathy but overall nothing significant. Patient states that she cannot do her daily activities and having difficulty working. Patient states that it hurts her even laying on it at night. No the pain medication seems to be helping. Patient has had increasing stress in her life that may have exacerbated the situation.    Past medical history, social, surgical and family history all reviewed in electronic medical record.   Review of Systems: No headache, visual changes, nausea, vomiting, diarrhea, constipation, dizziness, abdominal pain, skin rash, fevers, chills, night sweats, weight loss, swollen lymph nodes, body aches, joint swelling, muscle aches, chest pain, shortness of breath, mood changes.   Objective Blood pressure 126/80, pulse 89, height 5\' 3"  (1.6 m), weight 171 lb (77.565  kg), SpO2 99.00%.  General: No apparent distress alert and oriented x3 mood and affect normal, dressed appropriately.  HEENT: Pupils equal, extraocular movements intact  Respiratory: Patient's speak in full sentences and does not appear short of breath  Cardiovascular: No lower extremity edema, non tender, no erythema  Skin: Warm dry intact with no signs of infection or rash on extremities or on axial skeleton.  Abdomen: Soft nontender  Neuro: Cranial nerves II through XII are intact, neurovascularly intact in all extremities with 2+ DTRs and 2+ pulses.  Lymph: No lymphadenopathy of posterior or anterior cervical chain or axillae bilaterally.  Gait normal with good balance and coordination.  MSK:  Non tender with full range of motion and good stability and symmetric strength and tone of  elbows, wrist, hip, knee and ankles bilaterally.  Neck: Inspection unremarkable. No palpable stepoffs. Pain with Spurling's maneuver with mild radicular symptoms in the C4 and C5 distribution right side. Mild decreased range of motion the last 5 in all positions. Grip strength and sensation normal in bilateral hands Strength good C4 to T1 distribution No sensory change to C4 to T1 Negative Hoffman sign bilaterally Reflexes normal No significant change from previous exam Shoulder: Right Inspection reveals no abnormalities, atrophy or asymmetry. Palpation is normal with no tenderness over AC joint or bicipital groove. ROM is full in all planes. Rotator cuff strength normal throughout. Severe impingement signs noted today. Speeds and Yergason's tests normal. Normal scapular function observed. No painful arc and no drop arm sign. No apprehension sign Mild tenderness of the paraspinal musculature of the thoracic spine bilaterally.  Osteopathic findings T2 extended rotated and side  bent right inhaled second rib  Procedure: Real-time Ultrasound Guided Injection of right glenohumeral joint Device: GE  Logiq E  Ultrasound guided injection is preferred based studies that show increased duration, increased effect, greater accuracy, decreased procedural pain, increased response rate with ultrasound guided versus blind injection.  Verbal informed consent obtained.  Time-out conducted.  Noted no overlying erythema, induration, or other signs of local infection.  Skin prepped in a sterile fashion.  Local anesthesia: Topical Ethyl chloride.  With sterile technique and under real time ultrasound guidance:  Joint visualized.  23g 1  inch needle inserted posterior approach. Pictures taken for needle placement. Patient did have injection of 2 cc of 1% lidocaine, 2 cc of 0.5% Marcaine, and 1.0 cc of Kenalog 40 mg/dL. Completed without difficulty  Pain immediately resolved suggesting accurate placement of the medication.  Advised to call if fevers/chills, erythema, induration, drainage, or persistent bleeding.  Images permanently stored and available for review in the ultrasound unit.  Impression: Technically successful ultrasound guided injection.    Impression and Recommendations:     This case required medical decision making of moderate complexity.

## 2014-03-20 NOTE — Assessment & Plan Note (Signed)
Decision today to treat with OMT was based on Physical Exam  After verbal consent patient was treated with HVLA, ME techniques in thoracic rib areas  Patient tolerated the procedure well with improvement in symptoms  Patient given exercises, stretches and lifestyle modifications  See medications in patient instructions if given  Patient will follow up in 2-3 weeks

## 2014-03-20 NOTE — Assessment & Plan Note (Signed)
I think that this is likely secondary to muscle imbalances and more than the shoulder bursitis. Patient had some improvement with the injection. Patient did respond fairly well to osteopathic manipulation as well. I do think that patient's breast implants could be causing some muscle imbalances patient is thinking about having breast reconstruction again in the future. Will discuss about it further in 3 weeks.

## 2014-03-20 NOTE — Patient Instructions (Signed)
Good to see you The MRi of shoulder was mostly normal.  Sorry the  Epidural did not help but it did give [name] information.  Did injection in shoulder and I hope it helps Did manipulate your 2nd rib as well which is good.  Ice is your friend.  Try pennsaid twice daily.  Come back in 2-3 weeks.

## 2014-03-20 NOTE — Assessment & Plan Note (Signed)
Patient was given an injection today. Patient did have some mild to moderate improvement after the injection. I do believe that some of this is actually unfortunately secondary to anxiety and psychological component. Patient encouraged to do the exercises regularly. We talked about starting formal physical therapy again which patient declined. I do think also the patient's breast implants could also be contributing. Reaming these different changes and patient will come back again in 2-3 weeks for further evaluation and treatment.

## 2014-03-23 ENCOUNTER — Encounter: Payer: Self-pay | Admitting: Family Medicine

## 2014-03-29 ENCOUNTER — Encounter: Payer: Self-pay | Admitting: Family Medicine

## 2014-04-02 ENCOUNTER — Encounter: Payer: Self-pay | Admitting: Family Medicine

## 2014-04-02 DIAGNOSIS — M5030 Other cervical disc degeneration, unspecified cervical region: Secondary | ICD-10-CM

## 2014-04-03 ENCOUNTER — Encounter: Payer: Self-pay | Admitting: Family Medicine

## 2014-04-03 NOTE — Telephone Encounter (Signed)
Epidural injection entered.  

## 2014-04-07 ENCOUNTER — Other Ambulatory Visit: Payer: Self-pay | Admitting: Obstetrics and Gynecology

## 2014-04-08 LAB — CYTOLOGY - PAP

## 2014-04-10 ENCOUNTER — Ambulatory Visit
Admission: RE | Admit: 2014-04-10 | Discharge: 2014-04-10 | Disposition: A | Discharge status: Home / Self Care | Payer: BC Managed Care – PPO | Source: Ambulatory Visit | Attending: Family Medicine | Admitting: Family Medicine

## 2014-04-10 DIAGNOSIS — M5030 Other cervical disc degeneration, unspecified cervical region: Secondary | ICD-10-CM

## 2014-04-10 MED ORDER — IOHEXOL 300 MG/ML  SOLN
1.0000 mL | Freq: Once | INTRAMUSCULAR | Status: AC | PRN
Start: 2014-04-10 — End: 2014-04-10
  Administered 2014-04-10: 1 mL via EPIDURAL

## 2014-04-10 MED ORDER — TRIAMCINOLONE ACETONIDE 40 MG/ML IJ SUSP (RADIOLOGY)
60.0000 mg | Freq: Once | INTRAMUSCULAR | Status: AC
  Administered 2014-04-10: 60 mg via EPIDURAL

## 2014-04-10 NOTE — Discharge Instructions (Signed)

## 2014-04-13 ENCOUNTER — Encounter: Payer: Self-pay | Admitting: Family Medicine

## 2014-04-28 ENCOUNTER — Encounter: Payer: Self-pay | Admitting: Family Medicine

## 2014-04-28 ENCOUNTER — Other Ambulatory Visit: Payer: Self-pay | Admitting: Internal Medicine

## 2014-04-28 NOTE — Telephone Encounter (Signed)
Pt is requesting refill on Cyclobenazeprine. No longer on current medication list.   Last OV: 12/01/2013 Last Fill: 09/11/2013 # [id number] [phone]   Please advise.

## 2014-04-28 NOTE — Telephone Encounter (Signed)
Call patient, if has a pain that she thinks Flexeril will help, okay to refill #[phone], same sig.  If not better or symptoms are severe: needs office visit

## 2014-04-29 NOTE — Telephone Encounter (Signed)
Spoke with Pt, informed her of medication refill. Instructed her if not any better she will need OV. Pt verbalized understanding.

## 2014-05-05 ENCOUNTER — Other Ambulatory Visit: Payer: Self-pay

## 2014-05-05 ENCOUNTER — Other Ambulatory Visit: Payer: Self-pay | Admitting: Internal Medicine

## 2014-05-05 ENCOUNTER — Encounter: Payer: Self-pay | Admitting: Family Medicine

## 2014-05-05 DIAGNOSIS — R921 Mammographic calcification found on diagnostic imaging of breast: Secondary | ICD-10-CM

## 2014-05-05 DIAGNOSIS — M5030 Other cervical disc degeneration, unspecified cervical region: Secondary | ICD-10-CM

## 2014-05-14 ENCOUNTER — Encounter: Payer: Self-pay | Admitting: Family Medicine

## 2014-05-14 ENCOUNTER — Ambulatory Visit (INDEPENDENT_AMBULATORY_CARE_PROVIDER_SITE_OTHER): Payer: BC Managed Care – PPO | Admitting: Family Medicine

## 2014-05-14 VITALS — BP 109/61 | HR 82 | Temp 98.6°F | Ht 63.0 in | Wt 180.6 lb

## 2014-05-14 DIAGNOSIS — R202 Paresthesia of skin: Secondary | ICD-10-CM

## 2014-05-14 DIAGNOSIS — Z0000 Encounter for general adult medical examination without abnormal findings: Secondary | ICD-10-CM

## 2014-05-14 DIAGNOSIS — G43009 Migraine without aura, not intractable, without status migrainosus: Secondary | ICD-10-CM

## 2014-05-14 DIAGNOSIS — R200 Anesthesia of skin: Secondary | ICD-10-CM

## 2014-05-14 LAB — BASIC METABOLIC PANEL
BUN: 12 mg/dL (ref 6–23)
CO2: 24 mEq/L (ref 19–32)
Calcium: 8.5 mg/dL (ref 8.4–10.5)
Chloride: 106 mEq/L (ref 96–112)
Creatinine, Ser: 0.8 mg/dL (ref 0.4–1.2)
GFR: 105.58 mL/min (ref >60.00)
Glucose, Bld: 88 mg/dL (ref 70–99)
Potassium: 3.9 mEq/L (ref 3.5–5.1)
SODIUM: 135 meq/L (ref 135–145)

## 2014-05-14 LAB — LIPID PANEL
CHOL/HDL RATIO: 3
Cholesterol: 199 mg/dL (ref 0–200)
HDL: 75.4 mg/dL (ref >39.00)
LDL CALC: 111 mg/dL — AB (ref 0–99)
NONHDL: 123.6
Triglycerides: 61 mg/dL (ref 0.0–149.0)
VLDL: 12.2 mg/dL (ref 0.0–40.0)

## 2014-05-14 LAB — CBC WITH DIFFERENTIAL/PLATELET
Basophils Absolute: 0 10*3/uL (ref 0.0–0.1)
Basophils Relative: 0.5 % (ref 0.0–3.0)
EOS PCT: 0.3 % (ref 0.0–5.0)
Eosinophils Absolute: 0 10*3/uL (ref 0.0–0.7)
HEMATOCRIT: 39.7 % (ref 36.0–46.0)
Hemoglobin: 12.7 g/dL (ref 12.0–15.0)
LYMPHS ABS: 1.5 10*3/uL (ref 0.7–4.0)
LYMPHS PCT: 17.7 % (ref 12.0–46.0)
MCHC: 32 g/dL (ref 30.0–36.0)
MCV: 91.2 fl (ref 78.0–100.0)
MONOS PCT: 7.4 % (ref 3.0–12.0)
Monocytes Absolute: 0.6 10*3/uL (ref 0.1–1.0)
Neutro Abs: 6.1 10*3/uL (ref 1.4–7.7)
Neutrophils Relative %: 74.1 % (ref 43.0–77.0)
PLATELETS: 385 10*3/uL (ref 150.0–400.0)
RBC: 4.36 Mil/uL (ref 3.87–5.11)
RDW: 14.5 % (ref 11.5–15.5)
WBC: 8.2 10*3/uL (ref 4.0–10.5)

## 2014-05-14 LAB — HEPATIC FUNCTION PANEL
ALK PHOS: 48 U/L (ref 39–117)
ALT: 30 U/L (ref 0–35)
AST: 21 U/L (ref 0–37)
Albumin: 3.5 g/dL (ref 3.5–5.2)
BILIRUBIN TOTAL: 0.4 mg/dL (ref 0.2–1.2)
Bilirubin, Direct: 0 mg/dL (ref 0.0–0.3)
Total Protein: 6.9 g/dL (ref 6.0–8.3)

## 2014-05-14 LAB — TSH: TSH: 1.26 u[IU]/mL (ref 0.35–4.50)

## 2014-05-14 MED ORDER — ZONISAMIDE 100 MG PO CAPS: 100.0000 mg | ORAL_CAPSULE | Freq: Every day | ORAL | Status: DC

## 2014-05-14 MED ORDER — NALTREXONE-BUPROPION HCL ER 8-90 MG PO TB12: ORAL_TABLET | ORAL | Status: DC

## 2014-05-14 NOTE — Progress Notes (Signed)
Pre visit review using our clinic review tool, if applicable. No additional management support is needed unless otherwise documented below in the visit note. 

## 2014-05-14 NOTE — Progress Notes (Signed)
Subjective:     Morgan Long is a 43 y.o. female and is here for a comprehensive physical exam. The patient reports problems - still struggling with numbness in R arm,-- saw neurosurgery today--- not surgical candidate.  pt requesting to see neurology.  History   Social History  . Marital Status: Single    Spouse Name: N/A    Number of Children: 0  . Years of Education: N/A   Occupational History  . 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 Smoking status: Never Smoker   . Smokeless tobacco: Never Used  . Alcohol Use: Yes     Comment: socially   . Drug Use: No  . Sexual Activity: No   Other Topics Concern  . Not on file   Social History Narrative   Household:sister and her 3 kids          Health Maintenance  Topic Date Due  . INFLUENZA VACCINE  01/04/2015  . PAP SMEAR  04/07/2017  . TETANUS/TDAP  07/07/2017    The following portions of the patient's history were reviewed and updated as appropriate:  She  has a past medical history of Endometriosis; Headache(784.0); TMJ pain dysfunction syndrome; and Allergic rhinitis. She  does not have any pertinent problems on file. She  has past surgical history that includes fallopian tube removed and Breast enhancement surgery (2006). Her family history includes Breast cancer in an other family member; Cancer in her maternal aunt, maternal aunt, maternal aunt, maternal aunt, maternal uncle, maternal uncle, and mother; Diabetes in an other family member; Heart disease in her father; Hypertension in an other family member; Throat cancer in her mother and another family member. There is no history of Colon cancer or Heart attack. She  reports that she has never smoked. She has never used smokeless tobacco. She reports that she drinks alcohol. She reports that she does not use illicit drugs. She has a current medication list which includes the following prescription(s):  azelastine, cetirizine, clindamycin, cyclobenzaprine, drysol, etonogestrel-ethinyl estradiol, fluocinolone acetonide scalp, mometasone, multivitamin, sulfacetamide sodium-sulfur, and triamcinolone cream. Current Outpatient Prescriptions on File Prior to Visit  Medication Sig Dispense Refill  . azelastine (ASTELIN) 0.1 % nasal spray Place 2 sprays into both nostrils daily as needed for rhinitis. Use in each nostril as directed    . cetirizine (ZYRTEC) 10 MG tablet Take 10 mg by mouth daily.      . clindamycin (CLEOCIN T) 1 % external solution     . cyclobenzaprine (FLEXERIL) 10 MG tablet TAKE 1 TABLET BY MOUTH AT BEDTIME AS NEEDED FOR HEADACHE & TMJ 30 tablet 0  . DRYSOL 20 % external solution     . etonogestrel-ethinyl estradiol (NUVARING) 0.12-0.015 MG/24HR vaginal ring Place 1 each vaginally every 28 (twenty-eight) days. Insert vaginally and leave in place for 3 consecutive weeks, then remove for 1 week.     Marland Kitchen FLUOCINOLONE ACETONIDE SCALP 0.01 % OIL     . mometasone (NASONEX) 50 MCG/ACT nasal spray Place 2 sprays into the nose daily as needed (allergies).    . Multiple Vitamin (MULTIVITAMIN) LIQD Take 5 mLs by mouth daily.    . Sulfacetamide Sodium-Sulfur 10-2 % LIQD     . triamcinolone cream (KENALOG) 0.1 % Apply 1 application topically 2 (two) times daily as needed (dry patches).     No current facility-administered medications on file prior to visit.   She is allergic  to hydrocodone and codeine..  Review of Systems Review of Systems  Constitutional: Negative for activity change, appetite change and fatigue.  HENT: Negative for hearing loss, congestion, tinnitus and ear discharge.  dentist q25m Eyes: Negative for visual disturbance (see optho q1y -- vision corrected to 20/20 with glasses).  Respiratory: Negative for cough, chest tightness and shortness of breath.   Cardiovascular: Negative for chest pain, palpitations and leg swelling.  Gastrointestinal: Negative for abdominal pain,  diarrhea, constipation and abdominal distention.  Genitourinary: Negative for urgency, frequency, decreased urine volume and difficulty urinating.  Musculoskeletal: Negative for back pain, arthralgias and gait problem.  Skin: Negative for color change, pallor and rash.  Neurological: Negative for dizziness, light-headedness, numbness and headaches.  Hematological: Negative for adenopathy. Does not bruise/bleed easily.  Psychiatric/Behavioral: Negative for suicidal ideas, confusion, sleep disturbance, self-injury, dysphoric mood, decreased concentration and agitation.       Objective:    BP 109/61 mmHg  Pulse 82  Temp(Src) 98.6 F (37 C) (Oral)  Ht 5\' 3"  (1.6 m)  Wt 180 lb 9.6 oz (81.92 kg)  BMI 32.00 kg/m2  SpO2 100%  LMP 04/24/2014 General appearance: alert, cooperative, appears stated age and no distress Head: Normocephalic, without obvious abnormality, atraumatic Eyes: negative findings: lids and lashes normal and pupils equal, round, reactive to light and accomodation Ears: normal TM's and external ear canals both ears Nose: Nares normal. Septum midline. Mucosa normal. No drainage or sinus tenderness. Throat: lips, mucosa, and tongue normal; teeth and gums normal Neck: no adenopathy, no carotid bruit, no JVD, supple, symmetrical, trachea midline and thyroid not enlarged, symmetric, no tenderness/mass/nodules Back: symmetric, no curvature. ROM normal. No CVA tenderness. Lungs: clear to auscultation bilaterally Breasts: gyn Heart: regular rate and rhythm, S1, S2 normal, no murmur, click, rub or gallop Abdomen: soft, non-tender; bowel sounds normal; no masses,  no organomegaly Pelvic: deferred-- gyn Extremities: extremities normal, atraumatic, no cyanosis or edema Pulses: 2+ and symmetric Skin: Skin color, texture, turgor normal. No rashes or lesions Lymph nodes: Cervical, supraclavicular, and axillary nodes normal. Neurologic: Alert and oriented X 3, normal strength and  tone. Normal symmetric reflexes. Normal coordination and gait Psych-- no depression, no anxiety      Assessment:    Healthy female exam.      Plan:    ghm utd  Check labs See After Visit Summary for Counseling Recommendations    1. Numbness and tingling of right arm Pt saw NS today-- she would like to see neurology - Ambulatory referral to Neurology  2. Migraine without aura and without status migrainosus, not intractable Refill meds----it has almost completely stopped the Migraines - zonisamide (ZONEGRAN) 100 MG capsule; Take 1 capsule (100 mg total) by mouth daily.  Dispense: 30 capsule; Refill: 5  3. Preventative health care   - Basic metabolic panel - CBC with Differential - Hepatic function panel - Lipid panel - POCT urinalysis dipstick - TSH  4. Severe obesity (BMI >= 40) Encouraged diet and exercise - Naltrexone-Bupropion HCl ER (CONTRAVE) 8-90 MG TB12; 2 po bid  Dispense: 120 tablet; Refill: 3

## 2014-05-14 NOTE — Patient Instructions (Signed)
Preventive Care for Adults A healthy lifestyle and preventive care can promote health and wellness. Preventive health guidelines for women include the following key practices.  A routine yearly physical is a good way to check with your health care provider about your health and preventive screening. It is a chance to share any concerns and updates on your health and to receive a thorough exam.  Visit your dentist for a routine exam and preventive care every 6 months. Brush your teeth twice a day and floss once a day. Good oral hygiene prevents tooth decay and gum disease.  The frequency of eye exams is based on your age, health, family medical history, use of contact lenses, and other factors. Follow your health care provider's recommendations for frequency of eye exams.  Eat a healthy diet. Foods like vegetables, fruits, whole grains, low-fat dairy products, and lean protein foods contain the nutrients you need without too many calories. Decrease your intake of foods high in solid fats, added sugars, and salt. Eat the right amount of calories for you.Get information about a proper diet from your health care provider, if necessary.  Regular physical exercise is one of the most important things you can do for your health. Most adults should get at least 150 minutes of moderate-intensity exercise (any activity that increases your heart rate and causes you to sweat) each week. In addition, most adults need muscle-strengthening exercises on 2 or more days a week.  Maintain a healthy weight. The body mass index (BMI) is a screening tool to identify possible weight problems. It provides an estimate of body fat based on height and weight. Your health care provider can find your BMI and can help you achieve or maintain a healthy weight.For adults 20 years and older:  A BMI below 18.5 is considered underweight.  A BMI of 18.5 to 24.9 is normal.  A BMI of 25 to 29.9 is considered overweight.  A BMI of  30 and above is considered obese.  Maintain normal blood lipids and cholesterol levels by exercising and minimizing your intake of saturated fat. Eat a balanced diet with plenty of fruit and vegetables. Blood tests for lipids and cholesterol should begin at age 76 and be repeated every 5 years. If your lipid or cholesterol levels are high, you are over 50, or you are at high risk for heart disease, you may need your cholesterol levels checked more frequently.Ongoing high lipid and cholesterol levels should be treated with medicines if diet and exercise are not working.  If you smoke, find out from your health care provider how to quit. If you do not use tobacco, do not start.  Lung cancer screening is recommended for adults aged 22-80 years who are at high risk for developing lung cancer because of a history of smoking. A yearly low-dose CT scan of the lungs is recommended for people who have at least a 30-pack-year history of smoking and are a current smoker or have quit within the past 15 years. A pack year of smoking is smoking an average of 1 pack of cigarettes a day for 1 year (for example: 1 pack a day for 30 years or 2 packs a day for 15 years). Yearly screening should continue until the smoker has stopped smoking for at least 15 years. Yearly screening should be stopped for people who develop a health problem that would prevent them from having lung cancer treatment.  If you are pregnant, do not drink alcohol. If you are breastfeeding,  be very cautious about drinking alcohol. If you are not pregnant and choose to drink alcohol, do not have more than 1 drink per day. One drink is considered to be 12 ounces (355 mL) of beer, 5 ounces (148 mL) of wine, or 1.5 ounces (44 mL) of liquor.  Avoid use of street drugs. Do not share needles with anyone. Ask for help if you need support or instructions about stopping the use of drugs.  High blood pressure causes heart disease and increases the risk of  stroke. Your blood pressure should be checked at least every 1 to 2 years. Ongoing high blood pressure should be treated with medicines if weight loss and exercise do not work.  If you are 75-52 years old, ask your health care provider if you should take aspirin to prevent strokes.  Diabetes screening involves taking a blood sample to check your fasting blood sugar level. This should be done once every 3 years, after age 15, if you are within normal weight and without risk factors for diabetes. Testing should be considered at a younger age or be carried out more frequently if you are overweight and have at least 1 risk factor for diabetes.  Breast cancer screening is essential preventive care for women. You should practice "breast self-awareness." This means understanding the normal appearance and feel of your breasts and may include breast self-examination. Any changes detected, no matter how small, should be reported to a health care provider. Women in their 58s and 30s should have a clinical breast exam (CBE) by a health care provider as part of a regular health exam every 1 to 3 years. After age 16, women should have a CBE every year. Starting at age 53, women should consider having a mammogram (breast X-ray test) every year. Women who have a family history of breast cancer should talk to their health care provider about genetic screening. Women at a high risk of breast cancer should talk to their health care providers about having an MRI and a mammogram every year.  Breast cancer gene (BRCA)-related cancer risk assessment is recommended for women who have family members with BRCA-related cancers. BRCA-related cancers include breast, ovarian, tubal, and peritoneal cancers. Having family members with these cancers may be associated with an increased risk for harmful changes (mutations) in the breast cancer genes BRCA1 and BRCA2. Results of the assessment will determine the need for genetic counseling and  BRCA1 and BRCA2 testing.  Routine pelvic exams to screen for cancer are no longer recommended for nonpregnant women who are considered low risk for cancer of the pelvic organs (ovaries, uterus, and vagina) and who do not have symptoms. Ask your health care provider if a screening pelvic exam is right for you.  If you have had past treatment for cervical cancer or a condition that could lead to cancer, you need Pap tests and screening for cancer for at least 20 years after your treatment. If Pap tests have been discontinued, your risk factors (such as having a new sexual partner) need to be reassessed to determine if screening should be resumed. Some women have medical problems that increase the chance of getting cervical cancer. In these cases, your health care provider may recommend more frequent screening and Pap tests.  The HPV test is an additional test that may be used for cervical cancer screening. The HPV test looks for the virus that can cause the cell changes on the cervix. The cells collected during the Pap test can be  tested for HPV. The HPV test could be used to screen women aged 30 years and older, and should be used in women of any age who have unclear Pap test results. After the age of 30, women should have HPV testing at the same frequency as a Pap test.  Colorectal cancer can be detected and often prevented. Most routine colorectal cancer screening begins at the age of 50 years and continues through age 75 years. However, your health care provider may recommend screening at an earlier age if you have risk factors for colon cancer. On a yearly basis, your health care provider may provide home test kits to check for hidden blood in the stool. Use of a small camera at the end of a tube, to directly examine the colon (sigmoidoscopy or colonoscopy), can detect the earliest forms of colorectal cancer. Talk to your health care provider about this at age 50, when routine screening begins. Direct  exam of the colon should be repeated every 5-10 years through age 75 years, unless early forms of pre-cancerous polyps or small growths are found.  People who are at an increased risk for hepatitis B should be screened for this virus. You are considered at high risk for hepatitis B if:  You were born in a country where hepatitis B occurs often. Talk with your health care provider about which countries are considered high risk.  Your parents were born in a high-risk country and you have not received a shot to protect against hepatitis B (hepatitis B vaccine).  You have HIV or AIDS.  You use needles to inject street drugs.  You live with, or have sex with, someone who has hepatitis B.  You get hemodialysis treatment.  You take certain medicines for conditions like cancer, organ transplantation, and autoimmune conditions.  Hepatitis C blood testing is recommended for all people born from 1945 through 1965 and any individual with known risks for hepatitis C.  Practice safe sex. Use condoms and avoid high-risk sexual practices to reduce the spread of sexually transmitted infections (STIs). STIs include gonorrhea, chlamydia, syphilis, trichomonas, herpes, HPV, and human immunodeficiency virus (HIV). Herpes, HIV, and HPV are viral illnesses that have no cure. They can result in disability, cancer, and death.  You should be screened for sexually transmitted illnesses (STIs) including gonorrhea and chlamydia if:  You are sexually active and are younger than 24 years.  You are older than 24 years and your health care provider tells you that you are at risk for this type of infection.  Your sexual activity has changed since you were last screened and you are at an increased risk for chlamydia or gonorrhea. Ask your health care provider if you are at risk.  If you are at risk of being infected with HIV, it is recommended that you take a prescription medicine daily to prevent HIV infection. This is  called preexposure prophylaxis (PrEP). You are considered at risk if:  You are a heterosexual woman, are sexually active, and are at increased risk for HIV infection.  You take drugs by injection.  You are sexually active with a partner who has HIV.  Talk with your health care provider about whether you are at high risk of being infected with HIV. If you choose to begin PrEP, you should first be tested for HIV. You should then be tested every 3 months for as long as you are taking PrEP.  Osteoporosis is a disease in which the bones lose minerals and strength   with aging. This can result in serious bone fractures or breaks. The risk of osteoporosis can be identified using a bone density scan. Women ages 72 years and over and women at risk for fractures or osteoporosis should discuss screening with their health care providers. Ask your health care provider whether you should take a calcium supplement or vitamin D to reduce the rate of osteoporosis.  Menopause can be associated with physical symptoms and risks. Hormone replacement therapy is available to decrease symptoms and risks. You should talk to your health care provider about whether hormone replacement therapy is right for you.  Use sunscreen. Apply sunscreen liberally and repeatedly throughout the day. You should seek shade when your shadow is shorter than you. Protect yourself by wearing long sleeves, pants, a wide-brimmed hat, and sunglasses year round, whenever you are outdoors.  Once a month, do a whole body skin exam, using a mirror to look at the skin on your back. Tell your health care provider of new moles, moles that have irregular borders, moles that are larger than a pencil eraser, or moles that have changed in shape or color.  Stay current with required vaccines (immunizations).  Influenza vaccine. All adults should be immunized every year.  Tetanus, diphtheria, and acellular pertussis (Td, Tdap) vaccine. Pregnant women should  receive 1 dose of Tdap vaccine during each pregnancy. The dose should be obtained regardless of the length of time since the last dose. Immunization is preferred during the 27th-36th week of gestation. An adult who has not previously received Tdap or who does not know her vaccine status should receive 1 dose of Tdap. This initial dose should be followed by tetanus and diphtheria toxoids (Td) booster doses every 10 years. Adults with an unknown or incomplete history of completing a 3-dose immunization series with Td-containing vaccines should begin or complete a primary immunization series including a Tdap dose. Adults should receive a Td booster every 10 years.  Varicella vaccine. An adult without evidence of immunity to varicella should receive 2 doses or a second dose if she has previously received 1 dose. Pregnant females who do not have evidence of immunity should receive the first dose after pregnancy. This first dose should be obtained before leaving the health care facility. The second dose should be obtained 4-8 weeks after the first dose.  Human papillomavirus (HPV) vaccine. Females aged 13-26 years who have not received the vaccine previously should obtain the 3-dose series. The vaccine is not recommended for use in pregnant females. However, pregnancy testing is not needed before receiving a dose. If a female is found to be pregnant after receiving a dose, no treatment is needed. In that case, the remaining doses should be delayed until after the pregnancy. Immunization is recommended for any person with an immunocompromised condition through the age of 69 years if she did not get any or all doses earlier. During the 3-dose series, the second dose should be obtained 4-8 weeks after the first dose. The third dose should be obtained 24 weeks after the first dose and 16 weeks after the second dose.  Zoster vaccine. One dose is recommended for adults aged 26 years or older unless certain conditions are  present.  Measles, mumps, and rubella (MMR) vaccine. Adults born before [birth year] generally are considered immune to measles and mumps. Adults born in [birth year] or later should have 1 or more doses of MMR vaccine unless there is a contraindication to the vaccine or there is laboratory evidence of immunity to  each of the three diseases. A routine second dose of MMR vaccine should be obtained at least 28 days after the first dose for students attending postsecondary schools, health care workers, or international travelers. People who received inactivated measles vaccine or an unknown type of measles vaccine during 1963-1967 should receive 2 doses of MMR vaccine. People who received inactivated mumps vaccine or an unknown type of mumps vaccine before 1979 and are at high risk for mumps infection should consider immunization with 2 doses of MMR vaccine. For females of childbearing age, rubella immunity should be determined. If there is no evidence of immunity, females who are not pregnant should be vaccinated. If there is no evidence of immunity, females who are pregnant should delay immunization until after pregnancy. Unvaccinated health care workers born before 1957 who lack laboratory evidence of measles, mumps, or rubella immunity or laboratory confirmation of disease should consider measles and mumps immunization with 2 doses of MMR vaccine or rubella immunization with 1 dose of MMR vaccine.  Pneumococcal 13-valent conjugate (PCV13) vaccine. When indicated, a person who is uncertain of her immunization history and has no record of immunization should receive the PCV13 vaccine. An adult aged 19 years or older who has certain medical conditions and has not been previously immunized should receive 1 dose of PCV13 vaccine. This PCV13 should be followed with a dose of pneumococcal polysaccharide (PPSV23) vaccine. The PPSV23 vaccine dose should be obtained at least 8 weeks after the dose of PCV13 vaccine. An adult aged 19  years or older who has certain medical conditions and previously received 1 or more doses of PPSV23 vaccine should receive 1 dose of PCV13. The PCV13 vaccine dose should be obtained 1 or more years after the last PPSV23 vaccine dose.  Pneumococcal polysaccharide (PPSV23) vaccine. When PCV13 is also indicated, PCV13 should be obtained first. All adults aged 65 years and older should be immunized. An adult younger than age 65 years who has certain medical conditions should be immunized. Any person who resides in a nursing home or long-term care facility should be immunized. An adult smoker should be immunized. People with an immunocompromised condition and certain other conditions should receive both PCV13 and PPSV23 vaccines. People with human immunodeficiency virus (HIV) infection should be immunized as soon as possible after diagnosis. Immunization during chemotherapy or radiation therapy should be avoided. Routine use of PPSV23 vaccine is not recommended for American Indians, [place] Natives, or people younger than 65 years unless there are medical conditions that require PPSV23 vaccine. When indicated, people who have unknown immunization and have no record of immunization should receive PPSV23 vaccine. One-time revaccination 5 years after the first dose of PPSV23 is recommended for people aged 19-64 years who have chronic kidney failure, nephrotic syndrome, asplenia, or immunocompromised conditions. People who received 1-2 doses of PPSV23 before age 65 years should receive another dose of PPSV23 vaccine at age 65 years or later if at least 5 years have passed since the previous dose. Doses of PPSV23 are not needed for people immunized with PPSV23 at or after age 65 years.  Meningococcal vaccine. Adults with asplenia or persistent complement component deficiencies should receive 2 doses of quadrivalent meningococcal conjugate (MenACWY-D) vaccine. The doses should be obtained at least 2 months apart.  Microbiologists working with certain meningococcal bacteria, military recruits, people at risk during an outbreak, and people who travel to or live in countries with a high rate of meningitis should be immunized. A first-year college student up through age   21 years who is living in a residence hall should receive a dose if she did not receive a dose on or after her 16th birthday. Adults who have certain high-risk conditions should receive one or more doses of vaccine.  Hepatitis A vaccine. Adults who wish to be protected from this disease, have certain high-risk conditions, work with hepatitis A-infected animals, work in hepatitis A research labs, or travel to or work in countries with a high rate of hepatitis A should be immunized. Adults who were previously unvaccinated and who anticipate close contact with an international adoptee during the first 60 days after arrival in the [place] from a country with a high rate of hepatitis A should be immunized.  Hepatitis B vaccine. Adults who wish to be protected from this disease, have certain high-risk conditions, may be exposed to blood or other infectious body fluids, are household contacts or sex partners of hepatitis B positive people, are clients or workers in certain care facilities, or travel to or work in countries with a high rate of hepatitis B should be immunized.  Haemophilus influenzae type b (Hib) vaccine. A previously unvaccinated person with asplenia or sickle cell disease or having a scheduled splenectomy should receive 1 dose of Hib vaccine. Regardless of previous immunization, a recipient of a hematopoietic stem cell transplant should receive a 3-dose series 6-12 months after her successful transplant. Hib vaccine is not recommended for adults with HIV infection. Preventive Services / Frequency Ages 63 to 82 years  Blood pressure check.** / Every 1 to 2 years.  Lipid and cholesterol check.** / Every 5 years beginning at age  70.  Clinical breast exam.** / Every 3 years for women in their 62s and 51s.  BRCA-related cancer risk assessment.** / For women who have family members with a BRCA-related cancer (breast, ovarian, tubal, or peritoneal cancers).  Pap test.** / Every 2 years from ages 70 through 44. Every 3 years starting at age 58 through age 110 or 5 with a history of 3 consecutive normal Pap tests.  HPV screening.** / Every 3 years from ages 82 through ages 55 to 40 with a history of 3 consecutive normal Pap tests.  Hepatitis C blood test.** / For any individual with known risks for hepatitis C.  Skin self-exam. / Monthly.  Influenza vaccine. / Every year.  Tetanus, diphtheria, and acellular pertussis (Tdap, Td) vaccine.** / Consult your health care provider. Pregnant women should receive 1 dose of Tdap vaccine during each pregnancy. 1 dose of Td every 10 years.  Varicella vaccine.** / Consult your health care provider. Pregnant females who do not have evidence of immunity should receive the first dose after pregnancy.  HPV vaccine. / 3 doses over 6 months, if 60 and younger. The vaccine is not recommended for use in pregnant females. However, pregnancy testing is not needed before receiving a dose.  Measles, mumps, rubella (MMR) vaccine.** / You need at least 1 dose of MMR if you were born in 1957 or later. You may also need a 2nd dose. For females of childbearing age, rubella immunity should be determined. If there is no evidence of immunity, females who are not pregnant should be vaccinated. If there is no evidence of immunity, females who are pregnant should delay immunization until after pregnancy.  Pneumococcal 13-valent conjugate (PCV13) vaccine.** / Consult your health care provider.  Pneumococcal polysaccharide (PPSV23) vaccine.** / 1 to 2 doses if you smoke cigarettes or if you have certain conditions.  Meningococcal vaccine.** /  1 dose if you are age 61 to 52 years and a [place] living in a residence hall, or have one of several medical conditions, you need to get vaccinated against meningococcal disease. You may also need additional booster doses.  Hepatitis A vaccine.** / Consult your health care provider.  Hepatitis B vaccine.** / Consult your health care provider.  Haemophilus influenzae type b (Hib) vaccine.** / Consult your health care provider. Ages 64 to 73 years  Blood pressure check.** / Every 1 to 2 years.  Lipid and cholesterol check.** / Every 5 years beginning at age 80 years.  Lung cancer screening. / Every year if you are aged 86-80 years and have a 30-pack-year history of smoking and currently smoke or have quit within the past 15 years. Yearly screening is stopped once you have quit smoking for at least 15 years or develop a health problem that would prevent you from having lung cancer treatment.  Clinical breast exam.** / Every year after age 70 years.  BRCA-related cancer risk assessment.** / For women who have family members with a BRCA-related cancer (breast, ovarian, tubal, or peritoneal cancers).  Mammogram.** / Every year beginning at age 53 years and continuing for as long as you are in good health. Consult with your health care provider.  Pap test.** / Every 3 years starting at age 76 years through age 40 or 18 years with a history of 3 consecutive normal Pap tests.  HPV screening.** / Every 3 years from ages 6 years through ages 39 to 22 years with a history of 3 consecutive normal Pap tests.  Fecal occult blood test (FOBT) of stool. / Every year beginning at age 70 years and continuing until age 14 years. You may not need to do this test if you get a colonoscopy every 10 years.  Flexible sigmoidoscopy or colonoscopy.** / Every 5 years for a flexible sigmoidoscopy or every 10 years for a colonoscopy beginning at age 68 years and continuing until age 29 years.  Hepatitis C blood test.** / For all people born from [birth year] through  1965 and any individual with known risks for hepatitis C.  Skin self-exam. / Monthly.  Influenza vaccine. / Every year.  Tetanus, diphtheria, and acellular pertussis (Tdap/Td) vaccine.** / Consult your health care provider. Pregnant women should receive 1 dose of Tdap vaccine during each pregnancy. 1 dose of Td every 10 years.  Varicella vaccine.** / Consult your health care provider. Pregnant females who do not have evidence of immunity should receive the first dose after pregnancy.  Zoster vaccine.** / 1 dose for adults aged 36 years or older.  Measles, mumps, rubella (MMR) vaccine.** / You need at least 1 dose of MMR if you were born in 1957 or later. You may also need a 2nd dose. For females of childbearing age, rubella immunity should be determined. If there is no evidence of immunity, females who are not pregnant should be vaccinated. If there is no evidence of immunity, females who are pregnant should delay immunization until after pregnancy.  Pneumococcal 13-valent conjugate (PCV13) vaccine.** / Consult your health care provider.  Pneumococcal polysaccharide (PPSV23) vaccine.** / 1 to 2 doses if you smoke cigarettes or if you have certain conditions.  Meningococcal vaccine.** / Consult your health care provider.  Hepatitis A vaccine.** / Consult your health care provider.  Hepatitis B vaccine.** / Consult your health care provider.  Haemophilus influenzae type b (Hib) vaccine.** / Consult your health care provider. Ages 99  years and over  Blood pressure check.** / Every 1 to 2 years.  Lipid and cholesterol check.** / Every 5 years beginning at age 22 years.  Lung cancer screening. / Every year if you are aged 73-80 years and have a 30-pack-year history of smoking and currently smoke or have quit within the past 15 years. Yearly screening is stopped once you have quit smoking for at least 15 years or develop a health problem that would prevent you from having lung cancer  treatment.  Clinical breast exam.** / Every year after age 4 years.  BRCA-related cancer risk assessment.** / For women who have family members with a BRCA-related cancer (breast, ovarian, tubal, or peritoneal cancers).  Mammogram.** / Every year beginning at age 40 years and continuing for as long as you are in good health. Consult with your health care provider.  Pap test.** / Every 3 years starting at age 9 years through age 34 or 91 years with 3 consecutive normal Pap tests. Testing can be stopped between 65 and 70 years with 3 consecutive normal Pap tests and no abnormal Pap or HPV tests in the past 10 years.  HPV screening.** / Every 3 years from ages 57 years through ages 64 or 45 years with a history of 3 consecutive normal Pap tests. Testing can be stopped between 65 and 70 years with 3 consecutive normal Pap tests and no abnormal Pap or HPV tests in the past 10 years.  Fecal occult blood test (FOBT) of stool. / Every year beginning at age 15 years and continuing until age 17 years. You may not need to do this test if you get a colonoscopy every 10 years.  Flexible sigmoidoscopy or colonoscopy.** / Every 5 years for a flexible sigmoidoscopy or every 10 years for a colonoscopy beginning at age 86 years and continuing until age 71 years.  Hepatitis C blood test.** / For all people born from [birth year] through 1967 and any individual with known risks for hepatitis C.  Osteoporosis screening.** / A one-time screening for women ages 83 years and over and women at risk for fractures or osteoporosis.  Skin self-exam. / Monthly.  Influenza vaccine. / Every year.  Tetanus, diphtheria, and acellular pertussis (Tdap/Td) vaccine.** / 1 dose of Td every 10 years.  Varicella vaccine.** / Consult your health care provider.  Zoster vaccine.** / 1 dose for adults aged 61 years or older.  Pneumococcal 13-valent conjugate (PCV13) vaccine.** / Consult your health care provider.  Pneumococcal  polysaccharide (PPSV23) vaccine.** / 1 dose for all adults aged 28 years and older.  Meningococcal vaccine.** / Consult your health care provider.  Hepatitis A vaccine.** / Consult your health care provider.  Hepatitis B vaccine.** / Consult your health care provider.  Haemophilus influenzae type b (Hib) vaccine.** / Consult your health care provider. ** Family history and personal history of risk and conditions may change your health care provider's recommendations. Document Released: 07/18/2001 Document Revised: 10/06/2013 Document Reviewed: 10/17/2010 [facility] Patient Information 2015 [place], [place]. This information is not intended to replace advice given to you by your health care provider. Make sure you discuss any questions you have with your health care provider.

## 2014-05-18 ENCOUNTER — Ambulatory Visit
Admission: RE | Admit: 2014-05-18 | Discharge: 2014-05-18 | Disposition: A | Discharge status: Home / Self Care | Payer: BC Managed Care – PPO | Source: Ambulatory Visit | Attending: Internal Medicine | Admitting: Internal Medicine

## 2014-05-18 DIAGNOSIS — R921 Mammographic calcification found on diagnostic imaging of breast: Secondary | ICD-10-CM

## 2014-05-18 LAB — POCT URINALYSIS DIPSTICK
Blood, UA: NEGATIVE
Glucose, UA: NEGATIVE
LEUKOCYTES UA: NEGATIVE
Nitrite, UA: NEGATIVE
Urobilinogen, UA: 2
pH, UA: 5.5

## 2014-05-20 ENCOUNTER — Ambulatory Visit (INDEPENDENT_AMBULATORY_CARE_PROVIDER_SITE_OTHER): Payer: BC Managed Care – PPO | Admitting: Medical

## 2014-05-20 ENCOUNTER — Encounter: Payer: Self-pay | Admitting: Medical

## 2014-05-20 VITALS — BP 118/81 | HR 110 | Temp 99.1°F | Ht 63.0 in | Wt 183.4 lb

## 2014-05-20 DIAGNOSIS — J029 Acute pharyngitis, unspecified: Secondary | ICD-10-CM

## 2014-05-20 DIAGNOSIS — J020 Streptococcal pharyngitis: Secondary | ICD-10-CM

## 2014-05-20 LAB — POCT RAPID STREP A (OFFICE): RAPID STREP A SCREEN: POSITIVE — AB

## 2014-05-20 MED ORDER — AZITHROMYCIN 250 MG PO TABS: ORAL_TABLET | ORAL | Status: DC

## 2014-05-20 MED ORDER — FLUCONAZOLE 150 MG PO TABS: 150.0000 mg | ORAL_TABLET | Freq: Once | ORAL | Status: DC

## 2014-05-20 MED ORDER — CETIRIZINE HCL 10 MG PO TABS: 10.0000 mg | ORAL_TABLET | Freq: Every day | ORAL | Status: DC

## 2014-05-20 MED ORDER — FLUTICASONE PROPIONATE 50 MCG/ACT NA SUSP: 2.0000 | Freq: Every day | NASAL | Status: DC

## 2014-05-20 NOTE — Patient Instructions (Signed)
You may have mild early flare of allergies with early subsequent sinus infection.   I want you to continue zyrtec and take flonase nasal spray.    Your throat is moderate red and your   rapid strep test was positive.  Start azithromycin.  Regarding your zonegran request, I am going to send Maudie Mercury Payne/Dr. Etter Sjogren e-mail  The 2 tablet a day refill request to them.

## 2014-05-20 NOTE — Assessment & Plan Note (Signed)
Prescription azithromycin today.

## 2014-05-20 NOTE — Assessment & Plan Note (Signed)
You may have mild early flare of allergies with early subsequent sinus infection.   I want you to continue zyrtec and take flonase nasal spray.

## 2014-05-20 NOTE — Telephone Encounter (Signed)
Message to kim to redo zonegran rx.

## 2014-05-20 NOTE — Progress Notes (Signed)
Pre visit review using our clinic review tool, if applicable. No additional management support is needed unless otherwise documented below in the visit note. 

## 2014-05-20 NOTE — Progress Notes (Signed)
Subjective:    Patient ID: Morgan Long, female    DOB: 1970/08/07, 43 y.o.   MRN: [id number]  HPI   Pt in today reporting  cough, nasal congestion and runny nose for  2  Days. Pt throat a little itchy. Hoarse voice in the morning and little late at night. Pt has some contact to sister kids.  Pt is on zyrtec. Not on any current nasal sprays.   Associated symptoms( below yes or no)  Fever-none Chills-none Chest congestion-no Sneezing- yes, little. Itching eyes-yes.,Mild(yes) Sore throat- mild(yes) Post-nasal drainage-yes(mild) Wheezing-no Purulent nasal drainage- Fatigue-mild   LMP- November 20th.  Past Medical History  Diagnosis Date  . Endometriosis   . Headache(784.0)     occasional, dx w/ Migraines before, Topamax helps  . TMJ pain dysfunction syndrome     occasional  . Allergic rhinitis     History   Social History  . Marital Status: Single    Spouse Name: N/A    Number of Children: 0  . Years of Education: N/A   Occupational History  . 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 Smoking status: Never Smoker   . Smokeless tobacco: Never Used  . Alcohol Use: Yes     Comment: socially   . Drug Use: No  . Sexual Activity: No   Other Topics Concern  . Not on file   Social History Narrative   Household:sister and her 3 kids           Past Surgical History  Procedure Laterality Date  . Fallopian tube removed      Left  . Breast enhancement surgery  2006    Family History  Problem Relation Age of Onset  . Breast cancer      3 aunts dx age 71s  . Colon cancer Neg Hx   . Heart attack Neg Hx   . Diabetes      grandmother  . Hypertension      several family members  . Throat cancer      M, uncle   . Cancer Mother     throat  . Throat cancer Mother   . Heart disease Father   . Cancer Maternal Aunt     breast  . Cancer Maternal Uncle   . Cancer Maternal Aunt     breast  .  Cancer Maternal Uncle   . Cancer Maternal Aunt     breast  . Cancer Maternal Aunt     pancreatic    Allergies  Allergen Reactions  . Hydrocodone Nausea Only  . Codeine Itching and Nausea Only    Current Outpatient Prescriptions on File Prior to Visit  Medication Sig Dispense Refill  . azelastine (ASTELIN) 0.1 % nasal spray Place 2 sprays into both nostrils daily as needed for rhinitis. Use in each nostril as directed    . cetirizine (ZYRTEC) 10 MG tablet Take 10 mg by mouth daily.      . clindamycin (CLEOCIN T) 1 % external solution     . cyclobenzaprine (FLEXERIL) 10 MG tablet TAKE 1 TABLET BY MOUTH AT BEDTIME AS NEEDED FOR HEADACHE & TMJ 30 tablet 0  . DRYSOL 20 % external solution     . etonogestrel-ethinyl estradiol (NUVARING) 0.12-0.015 MG/24HR vaginal ring Place 1 each vaginally every 28 (twenty-eight) days. Insert vaginally and leave in place for 3 consecutive weeks, then remove for 1 week.     Marland Kitchen  FLUOCINOLONE ACETONIDE SCALP 0.01 % OIL     . mometasone (NASONEX) 50 MCG/ACT nasal spray Place 2 sprays into the nose daily as needed (allergies).    . Multiple Vitamin (MULTIVITAMIN) LIQD Take 5 mLs by mouth daily.    Marland Kitchen triamcinolone cream (KENALOG) 0.1 % Apply 1 application topically 2 (two) times daily as needed (dry patches).    . zonisamide (ZONEGRAN) 100 MG capsule Take 1 capsule (100 mg total) by mouth daily. 30 capsule 5   No current facility-administered medications on file prior to visit.    BP 118/81 mmHg  Pulse 110  Temp(Src) 99.1 F (37.3 C) (Oral)  Ht 5\' 3"  (1.6 m)  Wt 183 lb 6.4 oz (83.19 kg)  BMI 32.50 kg/m2  SpO2 96%  LMP 04/24/2014       Review of Systems  Constitutional: Positive for fatigue. Negative for fever and chills.  HENT: Positive for congestion, rhinorrhea, sinus pressure, sneezing and sore throat. Negative for postnasal drip.   Respiratory: Positive for cough. Negative for wheezing.   Cardiovascular: Negative for chest pain and  palpitations.  Musculoskeletal: Negative for neck pain.  Neurological: Negative for dizziness and headaches.  Hematological: Negative for adenopathy. Does not bruise/bleed easily.       Objective:   Physical Exam  General  Mental Status - Alert. General Appearance - Well groomed. Not in acute distress.  Skin Rashes- No Rashes.  HEENT Head- Normal. Ear Auditory Canal - Left- Normal. Right - Normal.Tympanic Membrane- Left- Normal. Right- Normal. Eye Sclera/Conjunctiva- Left- Normal. Right- Normal. Nose & Sinuses Nasal Mucosa- Left-  Boggy and Congested. Right-  Boggy and  Congested.Bilateral  Faint maxillary and frontal sinus pressure. Mouth & Throat Lips: Upper Lip- Normal: no dryness, cracking, pallor, cyanosis, or vesicular eruption. Lower Lip-Normal: no dryness, cracking, pallor, cyanosis or vesicular eruption. Buccal Mucosa- Bilateral- No Aphthous ulcers. Oropharynx- No Discharge but moderate bright Erythema. Tonsils: Characteristics- Bilateral-  bright Erythema , mild Congestion. Size/Enlargement- Bilateral- 1+ enlargement. Discharge- bilateral-None.  Neck Neck- Supple. No Masses. No lymphadenopathy.   Chest and Lung Exam Auscultation: Breath Sounds:-Clear even and unlabored.  Cardiovascular Auscultation:Rythm- Regular, rate and rhythm. Murmurs & Other Heart Sounds:Ausculatation of the heart reveal- No Murmurs.  Lymphatic Head & Neck General Head & Neck Lymphatics: Bilateral: Description- No Localized lymphadenopathy.        Assessment & Plan:

## 2014-05-21 ENCOUNTER — Encounter: Payer: Self-pay | Admitting: Neurology

## 2014-05-22 ENCOUNTER — Other Ambulatory Visit: Payer: Self-pay | Admitting: Family Medicine

## 2014-05-22 ENCOUNTER — Ambulatory Visit (INDEPENDENT_AMBULATORY_CARE_PROVIDER_SITE_OTHER): Payer: BC Managed Care – PPO | Admitting: Neurology

## 2014-05-22 ENCOUNTER — Encounter: Payer: Self-pay | Admitting: Neurology

## 2014-05-22 VITALS — BP 111/73 | HR 89 | Temp 98.5°F | Ht 63.0 in | Wt 184.0 lb

## 2014-05-22 DIAGNOSIS — R202 Paresthesia of skin: Secondary | ICD-10-CM

## 2014-05-22 DIAGNOSIS — R200 Anesthesia of skin: Secondary | ICD-10-CM

## 2014-05-22 DIAGNOSIS — M542 Cervicalgia: Secondary | ICD-10-CM

## 2014-05-22 NOTE — Patient Instructions (Signed)
Your right arm numbness is intermittent and could be from your degenerative neck disease.  We will do some blood work today and also schedule you for a nerve and muscle electrical test, called EMG/NCV test.

## 2014-05-22 NOTE — Progress Notes (Signed)
Subjective:    Patient ID: Morgan Long is a 43 y.o. female.  HPI     Star Age, MD, PhD [facility] [address] [place], [place] [address]  Dear Nena Polio,   I saw your patient, Morgan Long, upon your kind request in my neurologic clinic today for initial consultation of her right arm numbness. The patient is unaccompanied today. As you know, Morgan Long is a 43 year old right-handed woman with an underlying medical history of allergic rhinitis, endometriosis, TMJ problems, and neck pain, as well as obesity, who reports intermittent R arm numbness with bending over or a certain way of turning her head. Her symptoms have been going on for about 6 months. She has not noted any permanent numbness and no issues elsewhere. She does not have any significant weakness and sometimes feels weak when the numbness seems to come on. It goes away if she changes positions or adjusts her neck position. She has had right shoulder problems and pain in the right shoulder.  You saw her on 05/14/2014 for neck pain. She had undergone physical therapy without improvement of her neck pain. She had cervical epidural steroid injections which helped for about 24 hours as understand.  She had a C-spine MRI without contrast on 01/07/2014: Mild cervical spondylosis as described above without significant disc protrusion, foraminal stenosis or central canal stenosis.   Blood work from 05/14/2014 was reviewed: She had a BMP, CBC with differential, liver function panel, lipid panel and TSH all of which were fine with the exception of a borderline LDL of 111.   Her Past Medical History Is Significant For: Past Medical History  Diagnosis Date  . Endometriosis   . Headache(784.0)     occasional, dx w/ Migraines before, Topamax helps  . TMJ pain dysfunction syndrome     occasional  . Allergic rhinitis     Her Past Surgical History Is Significant For: Past Surgical  History  Procedure Laterality Date  . Fallopian tube removed      Left  . Breast enhancement surgery  2006    Her Family History Is Significant For: Family History  Problem Relation Age of Onset  . Breast cancer      3 aunts dx age 79s  . Diabetes      grandmother  . Hypertension      several family members  . Throat cancer      M, uncle   . Colon cancer Neg Hx   . Heart attack Neg Hx   . Cancer Mother     throat  . Throat cancer Mother   . Heart disease Father   . Cancer Father   . Cancer Maternal Aunt     breast  . Cancer Maternal Uncle   . Cancer Maternal Aunt     breast  . Cancer Maternal Uncle   . Cancer Maternal Aunt     breast  . Cancer Maternal Aunt     pancreatic    Her Social History Is Significant For: History   Social History  . Marital Status: Single    Spouse Name: N/A    Number of Children: 0  . Years of Education: BS   Occupational History  . 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 Smoking status: Never Smoker   . Smokeless tobacco: Never Used  . Alcohol Use: 0.0 oz/week  0 Not specified per week     Comment: socially   . Drug Use: No  . Sexual Activity: No   Other Topics Concern  . None   Social History Narrative   Household:sister and her 3 kids           Her Allergies Are:  Allergies  Allergen Reactions  . Acetaminophen Itching  . Hydrocodone Nausea Only  . Oxycodone Itching  . Phosphate Itching    sick  . Codeine Itching and Nausea Only  :   Her Current Medications Are:  Outpatient Encounter Prescriptions as of 05/22/2014  Medication Sig  . azelastine (ASTELIN) 0.1 % nasal spray Place 2 sprays into both nostrils daily as needed for rhinitis. Use in each nostril as directed  . azithromycin (ZITHROMAX) 250 MG tablet Take 2 tablets by mouth on day 1, followed by 1 tablet by mouth daily for 4 days.  . clindamycin (CLEOCIN T) 1 % external solution   .  cyclobenzaprine (FLEXERIL) 10 MG tablet TAKE 1 TABLET BY MOUTH AT BEDTIME AS NEEDED FOR HEADACHE & TMJ  . doxycycline (MONODOX) 100 MG capsule Take 100 mg by mouth 2 (two) times daily.  . DRYSOL 20 % external solution   . ESTROGEL 0.75 MG/1.25 GM (0.06%) topical gel   . etonogestrel-ethinyl estradiol (NUVARING) 0.12-0.015 MG/24HR vaginal ring Place 1 each vaginally every 28 (twenty-eight) days. Insert vaginally and leave in place for 3 consecutive weeks, then remove for 1 week.   . fluconazole (DIFLUCAN) 150 MG tablet Take 1 tablet (150 mg total) by mouth once.  Marland Kitchen FLUOCINOLONE ACETONIDE SCALP 0.01 % OIL   . fluticasone (FLONASE) 50 MCG/ACT nasal spray Place 2 sprays into both nostrils daily.  Marland Kitchen gabapentin (NEURONTIN) 100 MG capsule Take 100 mg by mouth 3 (three) times daily.  . mometasone (NASONEX) 50 MCG/ACT nasal spray Place 2 sprays into the nose daily as needed (allergies).  . Multiple Vitamin (MULTIVITAMIN) LIQD Take 5 mLs by mouth daily.  . Sulfacetamide Sodium-Sulfur 10-2 % LIQD   . triamcinolone cream (KENALOG) 0.1 % Apply 1 application topically 2 (two) times daily as needed (dry patches).  . zonisamide (ZONEGRAN) 100 MG capsule Take 1 capsule (100 mg total) by mouth daily.  . [DISCONTINUED] Acetone SOLN by Does not apply route. 0.1% , every day a thin layer to the affected area as needed  . [DISCONTINUED] cetirizine (ZYRTEC) 10 MG tablet Take 10 mg by mouth daily.    . [DISCONTINUED] cetirizine (ZYRTEC) 10 MG tablet Take 1 tablet (10 mg total) by mouth daily. (Patient not taking: Reported on 05/22/2014)  : Review of Systems:  Out of a complete 14 point review of systems, all are reviewed and negative with the exception of these symptoms as listed below:   Review of Systems  Constitutional: Positive for fatigue.       Weight gain  Eyes: Positive for pain.  Respiratory: Positive for wheezing.   Gastrointestinal: Positive for constipation.  Endocrine: Positive for cold intolerance  and heat intolerance.       Increased thirst,flushing  Musculoskeletal:       Joint pain , cramps  Allergic/Immunologic:       Allergies  Neurological: Positive for weakness, numbness and headaches.       Insomnia  Hematological: Bruises/bleeds easily.    Objective:  Neurologic Exam  Physical Exam Physical Examination:   Filed Vitals:   05/22/14 1110  BP: 111/73  Pulse: 89  Temp: 98.5 F (36.9 C)  General Examination: The patient is a very pleasant 43 y.o. female in no acute distress. She appears well-developed and well-nourished and well groomed.   HEENT: Normocephalic, atraumatic, pupils are equal, round and reactive to light and accommodation. Funduscopic exam is normal with sharp disc margins noted. Extraocular tracking is good without limitation to gaze excursion or nystagmus noted. Normal smooth pursuit is noted. Hearing is grossly intact. Tympanic membranes are clear bilaterally. Face is symmetric with normal facial animation and normal facial sensation. Speech is clear with no dysarthria noted. There is no hypophonia. There is no lip, neck/head, jaw or voice tremor. Neck is supple with full range of passive and active motion. There are no carotid bruits on auscultation. Oropharynx exam reveals: mild mouth dryness, good dental hygiene and mild airway crowding, due to redundant soft palate. She has mild pharyngeal erythema. Mallampati is class II. She describes very slight numbness briefly when she turns her neck to the right. This affects the frontal part of her right upper arm.  Chest: Clear to auscultation without wheezing, rhonchi or crackles noted.  Heart: S1+S2+0, regular and normal without murmurs, rubs or gallops noted.   Abdomen: Soft, non-tender and non-distended with normal bowel sounds appreciated on auscultation.  Extremities: There is no pitting edema in the distal lower extremities bilaterally. Pedal pulses are intact.  Skin: Warm and dry without trophic  changes noted. There are no varicose veins.  Musculoskeletal: exam reveals no obvious joint deformities, tenderness or joint swelling or erythema.   Neurologically:  Mental status: The patient is awake, alert and oriented in all 4 spheres. Her immediate and remote memory, attention, language skills and fund of knowledge are appropriate. There is no evidence of aphasia, agnosia, apraxia or anomia. Speech is clear with normal prosody and enunciation. Thought process is linear. Mood is normal and affect is normal.  Cranial nerves II - XII are as described above under HEENT exam. In addition: shoulder shrug is normal with equal shoulder height noted. Motor exam: Normal bulk, strength and tone is noted. There is no drift, tremor or rebound. Romberg is negative. Reflexes are 2+ throughout. Babinski: Toes are flexor bilaterally. Fine motor skills and coordination: intact with normal finger taps, normal hand movements, normal rapid alternating patting, normal foot taps and normal foot agility.  Cerebellar testing: No dysmetria or intention tremor on finger to nose testing. Heel to shin is unremarkable bilaterally. There is no truncal or gait ataxia.  Sensory exam: intact to light touch, pinprick, vibration, temperature sense in the upper and lower extremities.  Gait, station and balance: She stands easily. No veering to one side is noted. No leaning to one side is noted. Posture is age-appropriate and stance is narrow based. Gait shows normal stride length and normal pace. No problems turning are noted. She turns en bloc. Tandem walk is unremarkable.   Assessment and Plan:   In summary, Krissie A Marcucci is a very pleasant 43 y.o.-year old female with an underlying medical history of allergic rhinitis, endometriosis, TMJ problems, and neck pain, as well as obesity, who reports intermittent R arm numbnessw. Symptoms come and go depending on her neck and body position. Her general physical and neurological exam in  particular are normal and she is reassured in that regard. Nevertheless, she could have intermittent compression related neuropathy possibly secondary to degenerative neck disease but she also reports shoulder problems. Her history and physical exam are not suggestive of any primary underlying neurological progressive disease and she is also reassured in  that regard. We can certainly proceed with additional testing for reassurance including EMG and nerve conduction testing as well as some additional blood work which we will do today. We will call her with her test results to keep her posted. I will see her back when these tests are done. I answered all her questions today and she was in agreement.   Thank you very much for allowing me to participate in the care of this nice patient. If I can be of any further assistance to you please do not hesitate to call me at [phone].  Sincerely,   Star Age, MD, PhD

## 2014-05-27 NOTE — Progress Notes (Signed)
Quick Note:  Please call Patient regarding her Recent blood work. B12 level was elevated, indicating that she is likely taking additional B12. She can discuss with her primary care physician whether she needs to be taking any additional B12. Hemoglobin A1c which is the diabetes marker was borderline at 5.7, indicating risk for diabetes but no frank diabetes. She can most likely reduce the hemoglobin A1c if she loses weight. She can discuss this further also with her primary care physician. Star Age, MD, PhD [facility] ([facility]  ______

## 2014-05-28 LAB — HGB A1C W/O EAG: HEMOGLOBIN A1C: 5.7 % — AB (ref 4.8–5.6)

## 2014-05-28 LAB — B12 AND FOLATE PANEL
Folate: 14.5 ng/mL (ref >3.0)
Vitamin B-12: 1774 pg/mL — ABNORMAL HIGH (ref 211–946)

## 2014-05-28 LAB — VITAMIN B6: VITAMIN B6: 88.6 ug/L — AB (ref 2.0–32.8)

## 2014-05-28 LAB — METHYLMALONIC ACID, SERUM: Methylmalonic Acid: 80 nmol/L (ref 0–378)

## 2014-06-02 ENCOUNTER — Ambulatory Visit (INDEPENDENT_AMBULATORY_CARE_PROVIDER_SITE_OTHER): Payer: Self-pay | Admitting: Neurology

## 2014-06-02 ENCOUNTER — Ambulatory Visit (INDEPENDENT_AMBULATORY_CARE_PROVIDER_SITE_OTHER): Payer: BC Managed Care – PPO | Admitting: Neurology

## 2014-06-02 DIAGNOSIS — R202 Paresthesia of skin: Secondary | ICD-10-CM

## 2014-06-02 DIAGNOSIS — R200 Anesthesia of skin: Secondary | ICD-10-CM

## 2014-06-02 NOTE — Progress Notes (Signed)
Please refer to EMG and nerve conduction study procedure note. 

## 2014-06-02 NOTE — Progress Notes (Signed)
Quick Note:  Please call and advise the patient that the recent EMG and nerve conduction velocity test, which is the electrical nerve and muscle test we we performed, was reported as within normal limits. We checked for abnormal electrical discharges in the muscles or nerves and the report suggested normal findings. No further action is required on this test at this time. Please remind patient to keep any upcoming appointments or tests and to call us with any interim questions, concerns, problems or updates. Thanks,  Star Age, MD, PhD   ______

## 2014-06-02 NOTE — Procedures (Signed)
     HISTORY:   Morgan Long is a 43 year old patient with a history of intrinsic shoulder disease since the spring of 2015. Within the last month, she has developed some neck discomfort and numbness going down the entire right arm to the hand. The patient indicates that she can induce the numbness by turning her head to one side or the other, or flexing the neck down. The patient is being evaluated for a possible cervical radiculopathy.   NERVE CONDUCTION STUDIES:  Nerve conduction studies were performed on both upper extremities. The distal motor latencies and motor amplitudes for the median and ulnar nerves were within normal limits. The F wave latencies and nerve conduction velocities for these nerves were also normal. The sensory latencies for the median and ulnar nerves were normal.   EMG STUDIES:  EMG study was performed on the right upper extremity:  The first dorsal interosseous muscle reveals 2 to 4 K units with full recruitment. No fibrillations or positive waves were noted. The abductor pollicis brevis muscle reveals 2 to 4 K units with full recruitment. No fibrillations or positive waves were noted. The extensor indicis proprius muscle reveals 1 to 3 K units with full recruitment. No fibrillations or positive waves were noted. The pronator teres muscle reveals 2 to 3 K units with full recruitment. No fibrillations or positive waves were noted. The biceps muscle reveals 1 to 2 K units with full recruitment. No fibrillations or positive waves were noted. The triceps muscle reveals 2 to 4 K units with full recruitment. No fibrillations or positive waves were noted. The anterior deltoid muscle reveals 2 to 3 K units with full recruitment. No fibrillations or positive waves were noted. The cervical paraspinal muscles were tested at 2 levels. No abnormalities of insertional activity were seen at either level tested. There was good relaxation.   IMPRESSION:  Nerve conduction  studies done on both upper extremities were unremarkable, without evidence of a neuropathy seen. EMG evaluation of the right upper extremity was unremarkable, without evidence of an overlying cervical radiculopathy.  Jill Alexanders MD 06/02/2014 10:38 AM  [facility] [address] [place], [place] [address]  Phone [phone] Fax [phone]

## 2014-06-03 ENCOUNTER — Telehealth: Payer: Self-pay | Admitting: Neurology

## 2014-06-03 NOTE — Telephone Encounter (Addendum)
Called patient and shared EMG results ,verbalized understanding and said that the numbness has gotten better but is wondering if it could have been a pinched nerve

## 2014-06-06 ENCOUNTER — Encounter: Payer: Self-pay | Admitting: Neurology

## 2014-06-06 ENCOUNTER — Encounter: Payer: Self-pay | Admitting: Family Medicine

## 2014-06-09 ENCOUNTER — Telehealth: Payer: Self-pay | Admitting: *Deleted

## 2014-06-09 NOTE — Telephone Encounter (Signed)
Prior authorization initiated for Contrave through [place]. Awaiting determination. JG//CMA

## 2014-06-10 NOTE — Telephone Encounter (Signed)
PA approved effective 06/09/14 through 12/05/2014. JG//CMA

## 2014-07-01 ENCOUNTER — Ambulatory Visit: Payer: BC Managed Care – PPO | Admitting: Neurology

## 2014-07-02 ENCOUNTER — Encounter: Payer: Self-pay | Admitting: Neurology

## 2014-07-02 ENCOUNTER — Telehealth: Payer: Self-pay | Admitting: Neurology

## 2014-07-02 DIAGNOSIS — L658 Other specified nonscarring hair loss: Secondary | ICD-10-CM | POA: Insufficient documentation

## 2014-07-02 NOTE — Telephone Encounter (Signed)
Pt no showed NP appt w/ Dr. Posey Pronto. Per Dr. Posey Pronto we will not r/s this appt. Referring provider's office notified via EPIC referral message. No show letter mailed to pt / Sherri S.

## 2014-07-22 ENCOUNTER — Ambulatory Visit (INDEPENDENT_AMBULATORY_CARE_PROVIDER_SITE_OTHER): Payer: BLUE CROSS/BLUE SHIELD | Admitting: Medical

## 2014-07-22 ENCOUNTER — Encounter: Payer: Self-pay | Admitting: Medical

## 2014-07-22 VITALS — BP 119/75 | HR 98 | Temp 98.8°F | Ht 63.0 in | Wt 190.2 lb

## 2014-07-22 DIAGNOSIS — M791 Myalgia: Secondary | ICD-10-CM

## 2014-07-22 DIAGNOSIS — M609 Myositis, unspecified: Secondary | ICD-10-CM

## 2014-07-22 DIAGNOSIS — J020 Streptococcal pharyngitis: Secondary | ICD-10-CM

## 2014-07-22 DIAGNOSIS — IMO0001 Reserved for inherently not codable concepts without codable children: Secondary | ICD-10-CM | POA: Insufficient documentation

## 2014-07-22 LAB — POCT INFLUENZA A/B
Influenza A, POC: NEGATIVE
Influenza B, POC: NEGATIVE

## 2014-07-22 MED ORDER — AZITHROMYCIN 250 MG PO TABS: ORAL_TABLET | ORAL | Status: DC

## 2014-07-22 NOTE — Patient Instructions (Signed)
Strep pharyngitis Strep test done today.   Acute pharyngitis Strep test done today.+  Your strep test was positive. I am prescribing antibiotic. Rest hydrate, tylenol for fever and warm salt water gargles. Follow up in 7 days or as needed.       Flu test was negative.(Done due to myalgias)

## 2014-07-22 NOTE — Progress Notes (Signed)
Subjective:    Patient ID: Morgan Long, female    DOB: Oct 12, 1970, 44 y.o.   MRN: [id number]  HPI  Pt in with some throat. Pt states feels swollen and hurts to swallow. Pt states like this for 2 days. Some body aches. Feels weak fatigued and dry cough. Mild nasal congestion.  LMP- Pt has nuvaring  Pt did have flu-vaccine.  Pt thinks her brother used her tootbrush the other day. He has been sick with uri type symptoms.   Review of Systems  Constitutional: Positive for fatigue. Negative for fever and chills.  HENT: Positive for sore throat.   Respiratory: Negative for cough, choking, chest tightness, shortness of breath and wheezing.   Cardiovascular: Negative for chest pain and palpitations.  Gastrointestinal: Negative for abdominal pain.  Musculoskeletal: Positive for myalgias. Negative for back pain, joint swelling, arthralgias, neck pain and neck stiffness.  Neurological: Positive for weakness. Negative for dizziness and headaches.  Hematological: Positive for adenopathy. Does not bruise/bleed easily.    Past Medical History  Diagnosis Date  . Endometriosis   . Headache(784.0)     occasional, dx w/ Migraines before, Topamax helps  . TMJ pain dysfunction syndrome     occasional  . Allergic rhinitis   . Sinusitis     History   Social History  . Marital Status: Single    Spouse Name: N/A  . Number of Children: 0  . Years of Education: BS   Occupational History  . 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 Smoking status: Never Smoker   . Smokeless tobacco: Never Used  . Alcohol Use: 0.0 oz/week    0 Standard drinks or equivalent per week     Comment: socially   . Drug Use: No  . Sexual Activity: No   Other Topics Concern  . Not on file   Social History Narrative   Household:sister and her 3 kids           Past Surgical History  Procedure Laterality Date  . Fallopian tube removed     Left  . Breast enhancement surgery  2006  . Endrometroisis      Family History  Problem Relation Age of Onset  . Breast cancer      3 aunts dx age 31s  . Diabetes      grandmother  . Hypertension Maternal Aunt     several family members  . Throat cancer      M, uncle   . Colon cancer Neg Hx   . Heart attack Neg Hx   . Cancer Mother     throat  . Throat cancer Mother   . Heart disease Father   . Cancer Father   . Cancer Maternal Aunt     breast  . Cancer Maternal Uncle   . Cancer Maternal Aunt     breast  . Cancer Maternal Uncle   . Cancer Maternal Aunt     breast  . Cancer Maternal Aunt     pancreatic  . Asthma      cousin, maternal    Allergies  Allergen Reactions  . Acetaminophen Itching  . Hydrocodone Nausea Only  . Oxycodone Itching  . Phosphate Itching    sick  . Codeine Itching and Nausea Only    Current Outpatient Prescriptions on File Prior to Visit  Medication Sig Dispense Refill  . azelastine (ASTELIN) 0.1 % nasal  spray Place 2 sprays into both nostrils daily as needed for rhinitis. Use in each nostril as directed    . clindamycin (CLEOCIN T) 1 % external solution     . cyclobenzaprine (FLEXERIL) 10 MG tablet TAKE 1 TABLET BY MOUTH AT BEDTIME AS NEEDED FOR HEADACHE & TMJ 30 tablet 0  . doxycycline (MONODOX) 100 MG capsule Take 100 mg by mouth 2 (two) times daily.    . DRYSOL 20 % external solution     . ESTROGEL 0.75 MG/1.25 GM (0.06%) topical gel   11  . etonogestrel-ethinyl estradiol (NUVARING) 0.12-0.015 MG/24HR vaginal ring Place 1 each vaginally every 28 (twenty-eight) days. Insert vaginally and leave in place for 3 consecutive weeks, then remove for 1 week.     Marland Kitchen FLUOCINOLONE ACETONIDE SCALP 0.01 % OIL     . fluticasone (FLONASE) 50 MCG/ACT nasal spray Place 2 sprays into both nostrils daily. 16 g 1  . Multiple Vitamin (MULTIVITAMIN) LIQD Take 5 mLs by mouth daily.    Marland Kitchen zonisamide (ZONEGRAN) 100 MG capsule Take 1 capsule (100 mg total) by  mouth daily. 30 capsule 5   No current facility-administered medications on file prior to visit.    BP 119/75 mmHg  Pulse 98  Temp(Src) 98.8 F (37.1 C) (Oral)  Ht 5\' 3"  (1.6 m)  Wt 190 lb 3.2 oz (86.274 kg)  BMI 33.70 kg/m2  SpO2 100%  LMP 06/19/2014      Objective:   Physical Exam   General- No acute distress, pleasant pt.  Neck- from, No nuccal rigidity, Mild submandibular node hypertrophy.  Lungs- Clear even and unlabored.  Heart- Regular, rate and rhythm. HEENT- Head- normocephalic Eyes- PEERL bilaterally. Ears- Canals clear, normal tm's bilaterally. Nose- No frontal or maxillary sinus tenderness to palpation. Turbinates normal. Throat- posterior pharynx shows   2 + tonsillar hypertrophy plus,   Bright erythma,  discharge.   Neurologic- CN III- XII grossly intact.        Assessment & Plan:

## 2014-07-22 NOTE — Progress Notes (Signed)
Pre visit review using our clinic review tool, if applicable. No additional management support is needed unless otherwise documented below in the visit note. 

## 2014-07-22 NOTE — Assessment & Plan Note (Addendum)
   Your strep test was positive. I am prescribing aizthromycin antibiotic. Rest hydrate, tylenol for fever and warm salt water gargles. Follow up in 7 days or as needed.

## 2014-07-22 NOTE — Assessment & Plan Note (Addendum)
Strep test done today.+  Your strep test was positive. I am prescribing antibiotic. Rest hydrate, tylenol for fever and warm salt water gargles. Follow up in 7 days or as needed.

## 2014-08-03 ENCOUNTER — Ambulatory Visit: Payer: BC Managed Care – PPO | Admitting: Neurology

## 2014-08-25 ENCOUNTER — Telehealth: Payer: Self-pay | Admitting: Medical

## 2014-08-25 ENCOUNTER — Other Ambulatory Visit: Payer: Self-pay

## 2014-08-25 DIAGNOSIS — G43009 Migraine without aura, not intractable, without status migrainosus: Secondary | ICD-10-CM

## 2014-08-25 MED ORDER — ZONISAMIDE 100 MG PO CAPS: 100.0000 mg | ORAL_CAPSULE | Freq: Every day | ORAL | Status: DC

## 2014-08-25 NOTE — Telephone Encounter (Signed)
Question regarding refill request for meds.

## 2014-08-27 ENCOUNTER — Other Ambulatory Visit: Payer: Self-pay | Admitting: Family Medicine

## 2014-08-27 DIAGNOSIS — G43009 Migraine without aura, not intractable, without status migrainosus: Secondary | ICD-10-CM

## 2014-08-27 MED ORDER — ZONISAMIDE 100 MG PO CAPS
100.0000 mg | ORAL_CAPSULE | Freq: Two times a day (BID) | ORAL | Status: DC
Start: 2014-08-27 — End: 2015-07-26

## 2014-09-07 ENCOUNTER — Encounter: Payer: Self-pay | Admitting: Medical

## 2014-09-07 ENCOUNTER — Ambulatory Visit (INDEPENDENT_AMBULATORY_CARE_PROVIDER_SITE_OTHER): Payer: BLUE CROSS/BLUE SHIELD | Admitting: Medical

## 2014-09-07 ENCOUNTER — Ambulatory Visit (HOSPITAL_BASED_OUTPATIENT_CLINIC_OR_DEPARTMENT_OTHER)
Admission: RE | Admit: 2014-09-07 | Discharge: 2014-09-07 | Disposition: A | Discharge status: Home / Self Care | Payer: BLUE CROSS/BLUE SHIELD | Source: Ambulatory Visit | Attending: Medical | Admitting: Medical

## 2014-09-07 VITALS — BP 114/70 | HR 87 | Temp 98.7°F | Ht 63.0 in | Wt 191.2 lb

## 2014-09-07 DIAGNOSIS — J029 Acute pharyngitis, unspecified: Secondary | ICD-10-CM

## 2014-09-07 DIAGNOSIS — M2662 Arthralgia of temporomandibular joint: Secondary | ICD-10-CM | POA: Diagnosis not present

## 2014-09-07 DIAGNOSIS — M26629 Arthralgia of temporomandibular joint, unspecified side: Secondary | ICD-10-CM

## 2014-09-07 MED ORDER — MOMETASONE FUROATE 50 MCG/ACT NA SUSP: NASAL | Status: DC

## 2014-09-07 NOTE — Assessment & Plan Note (Addendum)
Rx nasonex for probable allergies. Use claritin or allegra otc.

## 2014-09-07 NOTE — Patient Instructions (Addendum)
Acute pharyngitis Hx of strep in past. Will get rapid to see if +. In past some positive results with minimal symptoms. Presently I think sign and symptom related to pnd from allergies. Rapid strep was negative.   Allergic rhinitis Rx nasonex for probable allergies. Use claritin or allegra otc.   TMJ PAIN Will advise no excess chewing of gum, candies or ice. Xray of tmj joints. Refer to oral surgeon.  Your tongue bite should heal quickly. If not let us know.    Follow up in 7-10 days any persisting or worsening signs or symptoms

## 2014-09-07 NOTE — Assessment & Plan Note (Signed)
Will advise no excess chewing of gum, candies or ice. Xray of tmj joints. Refer to oral surgeon.  Your tongue bite should heal quickly. If not let us know.

## 2014-09-07 NOTE — Progress Notes (Signed)
Subjective:    Patient ID: [name], female    DOB: 1971/06/05, 44 y.o.   MRN: [id number]  HPI  Pt want to make sure that her strep throat. She had couple of infection with strep but minimal symptoms per her report and pt wants to know if the infection was gone after tx. Right now faint sore throat only for one day.  Faint pnd. Faint mild itchy eyes. Mild nasal congestion. No runny nose. Some allergies this time of year.  Pt also mentions bilateral tmj region pain off and on for couple of years. Pt states she seems to always be tense around mouth. Not aware if she grinds teeth. But sometimes will bite her tongue at night.  Pt does not chew ice or gum.     lmp-August 07, 2014. Removed nuvaring 2 days ago.  Review of Systems  Constitutional: Negative for fever, chills and fatigue.  HENT: Positive for congestion, postnasal drip and sore throat. Negative for ear pain, sinus pressure and sneezing.        Rt side of tongue mild tongue small cuts.  Eyes: Positive for itching.  Respiratory: Negative for cough, choking and wheezing.   Cardiovascular: Negative for chest pain and palpitations.  Musculoskeletal: Negative for myalgias and back pain.       Bilateral tmj pain.   Neurological: Negative for dizziness and headaches.   Past Medical History  Diagnosis Date  . Endometriosis   . Headache(784.0)     occasional, dx w/ Migraines before, Topamax helps  . TMJ pain dysfunction syndrome     occasional  . Allergic rhinitis   . Sinusitis     History   Social History  . Marital Status: Single    Spouse Name: N/A  . Number of Children: 0  . Years of Education: BS   Occupational History  . 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 Smoking status: Never Smoker   . Smokeless tobacco: Never Used  . Alcohol Use: 0.0 oz/week    0 Standard drinks or equivalent per week     Comment: socially   . Drug Use: No  .  Sexual Activity: No   Other Topics Concern  . Not on file   Social History Narrative   Household:sister and her 3 kids           Past Surgical History  Procedure Laterality Date  . Fallopian tube removed      Left  . Breast enhancement surgery  2006  . Endrometroisis      Family History  Problem Relation Age of Onset  . Breast cancer      3 aunts dx age 57s  . Diabetes      grandmother  . Hypertension Maternal Aunt     several family members  . Throat cancer      M, uncle   . Colon cancer Neg Hx   . Heart attack Neg Hx   . Cancer Mother     throat  . Throat cancer Mother   . Heart disease Father   . Cancer Father   . Cancer Maternal Aunt     breast  . Cancer Maternal Uncle   . Cancer Maternal Aunt     breast  . Cancer Maternal Uncle   . Cancer Maternal Aunt     breast  . Cancer Maternal Aunt     pancreatic  .  Asthma      cousin, maternal    Allergies  Allergen Reactions  . Acetaminophen Itching  . Hydrocodone Nausea Only  . Oxycodone Itching  . Phosphate Itching    sick  . Codeine Itching and Nausea Only    Current Outpatient Prescriptions on File Prior to Visit  Medication Sig Dispense Refill  . azelastine (ASTELIN) 0.1 % nasal spray Place 2 sprays into both nostrils daily as needed for rhinitis. Use in each nostril as directed    . clindamycin (CLEOCIN T) 1 % external solution     . cyclobenzaprine (FLEXERIL) 10 MG tablet TAKE 1 TABLET BY MOUTH AT BEDTIME AS NEEDED FOR HEADACHE & TMJ 30 tablet 0  . doxycycline (MONODOX) 100 MG capsule Take 100 mg by mouth 2 (two) times daily.    . DRYSOL 20 % external solution     . ESTROGEL 0.75 MG/1.25 GM (0.06%) topical gel   11  . etonogestrel-ethinyl estradiol (NUVARING) 0.12-0.015 MG/24HR vaginal ring Place 1 each vaginally every 28 (twenty-eight) days. Insert vaginally and leave in place for 3 consecutive weeks, then remove for 1 week.     Marland Kitchen FLUOCINOLONE ACETONIDE SCALP 0.01 % OIL     . Multiple Vitamin  (MULTIVITAMIN) LIQD Take 5 mLs by mouth daily.    Marland Kitchen zonisamide (ZONEGRAN) 100 MG capsule Take 1 capsule (100 mg total) by mouth 2 (two) times daily. 60 capsule 5   No current facility-administered medications on file prior to visit.    BP 114/70 mmHg  Pulse 87  Temp(Src) 98.7 F (37.1 C) (Oral)  Ht 5\' 3"  (1.6 m)  Wt 191 lb 3.2 oz (86.728 kg)  BMI 33.88 kg/m2  SpO2 99%  LMP 07/08/2014       Objective:   Physical Exam  General  Mental Status - Alert. General Appearance - Well groomed. Not in acute distress.  Skin Rashes- No Rashes.  HEENT Head- Normal. Ear Auditory Canal - Left- Normal. Right - Normal.Tympanic Membrane- Left- Normal. Right- Normal. Eye Sclera/Conjunctiva- Left- Normal. Right- Normal. Nose & Sinuses Nasal Mucosa- Left-  Boggy and Congested. Right-  Boggy and  Congested.Bilateral maxillary and frontal sinus pressure. Mouth & Throat Lips: Upper Lip- Normal: no dryness, cracking, pallor, cyanosis, or vesicular eruption. Lower Lip-Normal: no dryness, cracking, pallor, cyanosis or vesicular eruption. Buccal Mucosa- Bilateral- No Aphthous ulcers. Oropharynx- No Discharge or Erythema. +pnd. Tonsils: Characteristics- Bilateral- No Erythema or Congestion. Size/Enlargement- Bilateral- No enlargement. Discharge- bilateral-None.   Tongue- rt side of tongue minimal laceration like small abrasions.  TMJ joints bilateral mild tender to palpation. Bu no crepitus.  Neck Neck- Supple. No Masses.   Chest and Lung Exam Auscultation: Breath Sounds:-Clear even and unlabored.  Cardiovascular Auscultation:Rythm- Regular, rate and rhythm. Murmurs & Other Heart Sounds:Ausculatation of the heart reveal- No Murmurs.  Lymphatic Head & Neck General Head & Neck Lymphatics: Bilateral: Description- No Localized lymphadenopathy.       Assessment & Plan:  LPN put in facial laceration. Pt did not have face laceration. This was error entry placed by lpn. NO face laceration at  all. She bit her tongue only.

## 2014-09-07 NOTE — Assessment & Plan Note (Addendum)
Hx of strep in past. Will get rapid to see if +. In past some positive results with minimal symptoms. Presently I think sign and symptom related to pnd from allergies. Rapid strep was negative.

## 2014-09-07 NOTE — Progress Notes (Signed)
Pre visit review using our clinic review tool, if applicable. No additional management support is needed unless otherwise documented below in the visit note. 

## 2014-09-08 ENCOUNTER — Ambulatory Visit (HOSPITAL_COMMUNITY)
Admission: RE | Admit: 2014-09-08 | Discharge: 2014-09-08 | Disposition: A | Discharge status: Home / Self Care | Payer: BLUE CROSS/BLUE SHIELD | Source: Ambulatory Visit | Attending: Medical | Admitting: Medical

## 2014-09-08 DIAGNOSIS — M2662 Arthralgia of temporomandibular joint: Secondary | ICD-10-CM | POA: Diagnosis present

## 2014-09-08 DIAGNOSIS — M26629 Arthralgia of temporomandibular joint, unspecified side: Secondary | ICD-10-CM

## 2014-09-08 LAB — POCT RAPID STREP A (OFFICE): RAPID STREP A SCREEN: NEGATIVE

## 2014-09-08 NOTE — Addendum Note (Signed)
Addended by: Bunnie Domino on: 09/08/2014 09:19 AM   Modules accepted: Orders

## 2014-11-12 ENCOUNTER — Other Ambulatory Visit: Payer: Self-pay | Admitting: Internal Medicine

## 2015-01-12 ENCOUNTER — Ambulatory Visit (INDEPENDENT_AMBULATORY_CARE_PROVIDER_SITE_OTHER): Payer: BLUE CROSS/BLUE SHIELD | Admitting: Family Medicine

## 2015-01-12 ENCOUNTER — Encounter: Payer: Self-pay | Admitting: Family Medicine

## 2015-01-12 VITALS — BP 110/72 | HR 90 | Temp 99.2°F | Wt 192.2 lb

## 2015-01-12 DIAGNOSIS — R110 Nausea: Secondary | ICD-10-CM | POA: Diagnosis not present

## 2015-01-12 DIAGNOSIS — K219 Gastro-esophageal reflux disease without esophagitis: Secondary | ICD-10-CM

## 2015-01-12 DIAGNOSIS — R12 Heartburn: Secondary | ICD-10-CM

## 2015-01-12 DIAGNOSIS — G43009 Migraine without aura, not intractable, without status migrainosus: Secondary | ICD-10-CM | POA: Diagnosis not present

## 2015-01-12 MED ORDER — GI COCKTAIL ~~LOC~~
30.0000 mL | Freq: Once | ORAL | Status: AC
  Administered 2015-01-12: 30 mL via ORAL

## 2015-01-12 MED ORDER — OMEPRAZOLE 40 MG PO CPDR: 40.0000 mg | DELAYED_RELEASE_CAPSULE | Freq: Every day | ORAL | Status: DC

## 2015-01-12 NOTE — Patient Instructions (Signed)

## 2015-01-12 NOTE — Progress Notes (Signed)
Pre visit review using our clinic review tool, if applicable. No additional management support is needed unless otherwise documented below in the visit note. 

## 2015-01-12 NOTE — Progress Notes (Signed)
Patient ID: Morgan Long, female    DOB: 09-27-1970  Age: 44 y.o. MRN: [id number]    Subjective:  Subjective HPI Morgan Long presents for tenderness in upper abd and nausea x 2-3 weeks.  No fever. No otc meds.  Review of Systems  Constitutional: Negative for diaphoresis, appetite change, fatigue and unexpected weight change.  Eyes: Negative for pain, redness and visual disturbance.  Respiratory: Negative for cough, chest tightness, shortness of breath and wheezing.   Cardiovascular: Negative for chest pain, palpitations and leg swelling.  Gastrointestinal: Positive for abdominal pain. Negative for nausea and vomiting.  Endocrine: Negative for cold intolerance, heat intolerance, polydipsia, polyphagia and polyuria.  Genitourinary: Negative for dysuria, frequency and difficulty urinating.  Neurological: Negative for dizziness, light-headedness, numbness and headaches.    History Past Medical History  Diagnosis Date  . Endometriosis   . Headache(784.0)     occasional, dx w/ Migraines before, Topamax helps  . TMJ pain dysfunction syndrome     occasional  . Allergic rhinitis   . Sinusitis     She has past surgical history that includes fallopian tube removed; Breast enhancement surgery (2006); and endrometroisis.   Her family history includes Asthma in an other family member; Breast cancer in an other family member; Cancer in her father, maternal aunt, maternal aunt, maternal aunt, maternal aunt, maternal uncle, maternal uncle, and mother; Diabetes in an other family member; Heart disease in her father; Hypertension in her maternal aunt; Throat cancer in her mother and another family member. There is no history of Colon cancer or Heart attack.She reports that she has never smoked. She has never used smokeless tobacco. She reports that she drinks alcohol. She reports that she does not use illicit drugs.  Current Outpatient Prescriptions on File Prior to Visit  Medication Sig  Dispense Refill  . azelastine (ASTELIN) 0.1 % nasal spray Place 2 sprays into both nostrils daily as needed for rhinitis. Use in each nostril as directed    . clindamycin (CLEOCIN T) 1 % external solution     . cyclobenzaprine (FLEXERIL) 10 MG tablet TAKE 1 TABLET BY MOUTH AT BEDTIME AS NEEDED FOR HEADACHE & TMJ 30 tablet 0  . doxycycline (MONODOX) 100 MG capsule Take 100 mg by mouth 2 (two) times daily.    . DRYSOL 20 % external solution     . etonogestrel-ethinyl estradiol (NUVARING) 0.12-0.015 MG/24HR vaginal ring Place 1 each vaginally every 28 (twenty-eight) days. Insert vaginally and leave in place for 3 consecutive weeks, then remove for 1 week.     Marland Kitchen FLUOCINOLONE ACETONIDE SCALP 0.01 % OIL     . mometasone (NASONEX) 50 MCG/ACT nasal spray 2 sprays each nostril q day 17 g 0  . Multiple Vitamin (MULTIVITAMIN) LIQD Take 5 mLs by mouth daily.    Marland Kitchen zonisamide (ZONEGRAN) 100 MG capsule Take 1 capsule (100 mg total) by mouth 2 (two) times daily. 60 capsule 5   No current facility-administered medications on file prior to visit.     Objective:  Objective Physical Exam  Constitutional: She is oriented to person, place, and time. She appears well-developed and well-nourished.  HENT:  Head: Normocephalic and atraumatic.  Eyes: Conjunctivae and EOM are normal.  Neck: Normal range of motion. Neck supple. No JVD present. Carotid bruit is not present. No thyromegaly present.  Cardiovascular: Normal rate, regular rhythm and normal heart sounds.   No murmur heard. Pulmonary/Chest: Effort normal and breath sounds normal. No respiratory distress. She has no wheezes.  She has no rales. She exhibits no tenderness.  Abdominal: There is tenderness. There is no rebound and no guarding.    Musculoskeletal: She exhibits no edema.  Neurological: She is alert and oriented to person, place, and time.  Skin: No rash noted.  Psychiatric: She has a normal mood and affect. Her behavior is normal.   BP 110/72  mmHg  Pulse 90  Temp(Src) 99.2 F (37.3 C) (Oral)  Wt 192 lb 3.2 oz (87.181 kg)  SpO2 99%  LMP 01/09/2015 Wt Readings from Last 3 Encounters:  01/12/15 192 lb 3.2 oz (87.181 kg)  09/07/14 191 lb 3.2 oz (86.728 kg)  07/22/14 190 lb 3.2 oz (86.274 kg)     Lab Results  Component Value Date   WBC 8.7 01/12/2015   HGB 12.5 01/12/2015   HCT 37.8 01/12/2015   PLT 305.0 01/12/2015   GLUCOSE 79 01/12/2015   CHOL 199 05/14/2014   TRIG 61.0 05/14/2014   HDL 75.40 05/14/2014   LDLCALC 111* 05/14/2014   ALT 30 05/14/2014   AST 21 05/14/2014   NA 138 01/12/2015   K 3.8 01/12/2015   CL 106 01/12/2015   CREATININE 0.88 01/12/2015   BUN 14 01/12/2015   CO2 24 01/12/2015   TSH 1.26 05/14/2014   HGBA1C 5.7* 05/22/2014    Dg Tmj Open & Close Bilateral  09/08/2014   CLINICAL DATA:  Bilateral temporomandibular joint pain.  EXAM: TEMPOROMANDIBULAR JOINTS  COMPARISON:  None.  FINDINGS: Temporomandibular joint appears normal bilaterally. With the mouth open, there is anterior translation of the mandibular condyle as expected. With the mouth closed, the mandibular condyles seat appropriately.  There is a an approximately 4 cm rounded density projected over the calvarium on the frontal projection. This likely represents the patient's hair tied back rather than intracranial mass.  IMPRESSION: Negative TMJ radiographs.   Electronically Signed   By: Dereck Ligas M.D.   On: 09/08/2014 16:27     Assessment & Plan:  Plan I have discontinued Ms. Brandau's ESTROGEL. I am also having her start on omeprazole. Additionally, I am having her maintain her etonogestrel-ethinyl estradiol, multivitamin, azelastine, DRYSOL, clindamycin, FLUOCINOLONE ACETONIDE SCALP, cyclobenzaprine, doxycycline, zonisamide, mometasone, and estradiol. We administered gi cocktail.  Meds ordered this encounter  Medications  . estradiol (EVAMIST) 1.53 MG/SPRAY transdermal spray    Sig: Place 1 spray onto the skin daily.  Marland Kitchen gi  cocktail (Maalox,Lidocaine,Donnatal)    Sig:   . omeprazole (PRILOSEC) 40 MG capsule    Sig: Take 1 capsule (40 mg total) by mouth daily.    Dispense:  90 capsule    Refill:  3    Problem List Items Addressed This Visit    GERD (gastroesophageal reflux disease)    con't omeprazole Pt given info on diet rto prn      Relevant Medications   gi cocktail (Maalox,Lidocaine,Donnatal) (Completed)   omeprazole (PRILOSEC) 40 MG capsule    Other Visit Diagnoses    Heartburn    -  Primary    Relevant Medications    gi cocktail (Maalox,Lidocaine,Donnatal) (Completed)    omeprazole (PRILOSEC) 40 MG capsule    Other Relevant Orders    Basic metabolic panel (Completed)    CBC with Differential/Platelet (Completed)    H. pylori antibody, IgG (Completed)    Nausea without vomiting        Migraine without aura and without status migrainosus, not intractable           Follow-up: Return if symptoms worsen  or fail to improve.  Garnet Koyanagi, DO

## 2015-01-13 ENCOUNTER — Telehealth: Payer: Self-pay | Admitting: Internal Medicine

## 2015-01-13 DIAGNOSIS — K219 Gastro-esophageal reflux disease without esophagitis: Secondary | ICD-10-CM | POA: Insufficient documentation

## 2015-01-13 LAB — CBC WITH DIFFERENTIAL/PLATELET
BASOS ABS: 0.1 10*3/uL (ref 0.0–0.1)
BASOS PCT: 1.1 % (ref 0.0–3.0)
Eosinophils Absolute: 0.1 10*3/uL (ref 0.0–0.7)
Eosinophils Relative: 0.7 % (ref 0.0–5.0)
HCT: 37.8 % (ref 36.0–46.0)
Hemoglobin: 12.5 g/dL (ref 12.0–15.0)
LYMPHS ABS: 2.2 10*3/uL (ref 0.7–4.0)
LYMPHS PCT: 24.8 % (ref 12.0–46.0)
MCHC: 32.9 g/dL (ref 30.0–36.0)
MCV: 88.2 fl (ref 78.0–100.0)
Monocytes Absolute: 0.5 10*3/uL (ref 0.1–1.0)
Monocytes Relative: 5.9 % (ref 3.0–12.0)
NEUTROS ABS: 5.9 10*3/uL (ref 1.4–7.7)
NEUTROS PCT: 67.5 % (ref 43.0–77.0)
Platelets: 305 10*3/uL (ref 150.0–400.0)
RBC: 4.29 Mil/uL (ref 3.87–5.11)
RDW: 13.4 % (ref 11.5–15.5)
WBC: 8.7 10*3/uL (ref 4.0–10.5)

## 2015-01-13 LAB — BASIC METABOLIC PANEL
BUN: 14 mg/dL (ref 6–23)
CALCIUM: 8.7 mg/dL (ref 8.4–10.5)
CO2: 24 mEq/L (ref 19–32)
CREATININE: 0.88 mg/dL (ref 0.40–1.20)
Chloride: 106 mEq/L (ref 96–112)
GFR: 88.89 mL/min (ref >60.00)
Glucose, Bld: 79 mg/dL (ref 70–99)
Potassium: 3.8 mEq/L (ref 3.5–5.1)
Sodium: 138 mEq/L (ref 135–145)

## 2015-01-13 LAB — H. PYLORI ANTIBODY, IGG: H PYLORI IGG: NEGATIVE

## 2015-01-13 NOTE — Telephone Encounter (Signed)
Pt seen by [facility] yesterday for acute visit.  Request being forwarded to PCP for below request.

## 2015-01-13 NOTE — Telephone Encounter (Signed)
Caller name: Relation to QA:[facility] Call back number:[phone] Pharmacy:[facility]  Reason for call: pt saw dr. Etter Sjogren on yesterday, states that she didn't receive one of her meds pt needs rx cyclobenzaprine (FLEXERIL) 10 MG tablet , please send to pharmacy,

## 2015-01-13 NOTE — Assessment & Plan Note (Signed)
con't omeprazole Pt given info on diet rto prn

## 2015-01-13 NOTE — Telephone Encounter (Signed)
Pt will need to be seen by Dr. Larose Kells for [facility], she has not been seen by Dr. Larose Kells since 11/2013.

## 2015-01-14 ENCOUNTER — Telehealth: Payer: Self-pay | Admitting: Internal Medicine

## 2015-01-14 MED ORDER — CYCLOBENZAPRINE HCL 10 MG PO TABS: ORAL_TABLET | ORAL | Status: DC

## 2015-01-14 NOTE — Telephone Encounter (Signed)
Rx faxed.    KP 

## 2015-01-14 NOTE — Telephone Encounter (Signed)
Pt states dr. Etter Sjogren informed her that she would provide the rx, pt has been seen by dr. Etter Sjogren.

## 2015-01-14 NOTE — Addendum Note (Signed)
Addended by: Ewing Schlein on: 01/14/2015 05:00 PM   Modules accepted: Orders

## 2015-01-14 NOTE — Telephone Encounter (Signed)
Refill x1 

## 2015-01-15 NOTE — Telephone Encounter (Signed)
Error/gd °

## 2015-01-21 ENCOUNTER — Encounter: Payer: Self-pay | Admitting: Family Medicine

## 2015-01-21 NOTE — Telephone Encounter (Signed)
Glomerular filtration rate---- part of kidney function

## 2015-01-22 ENCOUNTER — Telehealth: Payer: Self-pay | Admitting: Internal Medicine

## 2015-01-22 DIAGNOSIS — R1010 Upper abdominal pain, unspecified: Secondary | ICD-10-CM

## 2015-01-22 DIAGNOSIS — R12 Heartburn: Secondary | ICD-10-CM

## 2015-01-22 NOTE — Telephone Encounter (Signed)
Caller name: Jodene Relationship to patient: self Can be [phone] Pharmacy:  Reason for call: Pt states stomach is not getting better, that the nausea and burning is getting worse. She said that she needs to see a specialist or something further to be done. Advised Dr. Etter Sjogren out of office. She is requesting a call.

## 2015-01-22 NOTE — Telephone Encounter (Signed)
The patient's stomach issues are not improving, please advise if it is ok to send to GI.      KP

## 2015-01-22 NOTE — Telephone Encounter (Signed)
Okay to refer to GI? 

## 2015-01-22 NOTE — Telephone Encounter (Signed)
Referral has been placed and the patient is aware.     KP

## 2015-01-25 ENCOUNTER — Encounter: Payer: Self-pay | Admitting: *Deleted

## 2015-01-26 ENCOUNTER — Encounter: Payer: Self-pay | Admitting: Internal Medicine

## 2015-01-26 ENCOUNTER — Ambulatory Visit (INDEPENDENT_AMBULATORY_CARE_PROVIDER_SITE_OTHER): Payer: BLUE CROSS/BLUE SHIELD | Admitting: Internal Medicine

## 2015-01-26 VITALS — BP 108/62 | HR 72 | Ht 62.0 in | Wt 191.4 lb

## 2015-01-26 DIAGNOSIS — R110 Nausea: Secondary | ICD-10-CM

## 2015-01-26 DIAGNOSIS — K5900 Constipation, unspecified: Secondary | ICD-10-CM | POA: Diagnosis not present

## 2015-01-26 DIAGNOSIS — R1013 Epigastric pain: Secondary | ICD-10-CM

## 2015-01-26 DIAGNOSIS — K5909 Other constipation: Secondary | ICD-10-CM

## 2015-01-26 MED ORDER — ONDANSETRON 4 MG PO TBDP: 4.0000 mg | ORAL_TABLET | Freq: Three times a day (TID) | ORAL | Status: DC | PRN

## 2015-01-26 MED ORDER — LINACLOTIDE 145 MCG PO CAPS: 145.0000 ug | ORAL_CAPSULE | Freq: Every day | ORAL | Status: DC

## 2015-01-26 MED ORDER — PANTOPRAZOLE SODIUM 40 MG PO TBEC: 40.0000 mg | DELAYED_RELEASE_TABLET | Freq: Every day | ORAL | Status: DC

## 2015-01-26 NOTE — Progress Notes (Signed)
Patient ID: Morgan Long, female   DOB: 11/07/1970, 44 y.o.   MRN: [id number] HPI: Morgan Long is a 44 year old female with a past medical history of migraines, endometriosis and GERD versus dyspepsia who is seen in consultation at the request of Dr. Etter Sjogren to evaluate dyspepsia and constipation. She is here alone today. She reports she's been having burning epigastric abdominal pain and nausea over the last several weeks to a month. This is associated with nausea and the urge to vomit but she has not been vomiting. Doesn't tolerate relate to eating but certainly some foods seem to make it worse. She denies heartburn, dysphagia or odynophagia. She's had issues with similar abdominal pain on and off over the last several years and in the past as had abdominal ultrasound to evaluate gallbladder which was negative. Previously she would use 2-4 weeks of omeprazole 40 mg daily and symptoms would resolve. She would then stop the medication remain off for months to even a year. This time the pain has not improved with restarting omeprazole at 40 mg daily. She does report a good appetite. She endorses social stressors. She is gained 50 pounds in the last several years. She has had issues with the pain waking her up from sleep. She is being treated for migraines which have been recently worse. She does not use NSAIDs. Bowel movements she reports her rare and erratic. She reports chronic constipation. She denies blood in her stool or melena. Has a bowel movement about once per week. On occasion uses over-the-counter teas and natural laxative to induce bowel movements. She is interested in trying something to help with more bowel regularity. She has used robotics in the past which helps some with regularity and also bloating. She denies a family history of colon cancer. No known history of IBD.  She does not use tobacco. Rarely drinks alcohol. She is single without children. 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  She was checked for H. pylori by serum antibody recently and this was negative  Past Medical History  Diagnosis Date  . Endometriosis   . Headache(784.0)     occasional, dx w/ Migraines before, Topamax helps  . TMJ pain dysfunction syndrome     occasional  . Allergic rhinitis   . Sinusitis   . GERD (gastroesophageal reflux disease)     Past Surgical History  Procedure Laterality Date  . Fallopian tube removed      Left  . Breast enhancement surgery  2006  . Endrometroisis      Outpatient Prescriptions Prior to Visit  Medication Sig Dispense Refill  . azelastine (ASTELIN) 0.1 % nasal spray Place 2 sprays into both nostrils daily as needed for rhinitis. Use in each nostril as directed    . clindamycin (CLEOCIN T) 1 % external solution     . cyclobenzaprine (FLEXERIL) 10 MG tablet TAKE 1 TABLET BY MOUTH AT BEDTIME AS NEEDED FOR HEADACHE & TMJ 30 tablet 0  . doxycycline (MONODOX) 100 MG capsule Take 100 mg by mouth 2 (two) times daily.    . DRYSOL 20 % external solution     . estradiol (EVAMIST) 1.53 MG/SPRAY transdermal spray Place 1 spray onto the skin daily.    Marland Kitchen etonogestrel-ethinyl estradiol (NUVARING) 0.12-0.015 MG/24HR vaginal ring Place 1 each vaginally every 28 (twenty-eight) days. Insert vaginally and leave in place for 3 consecutive weeks, then remove for 1 week.     Marland Kitchen FLUOCINOLONE ACETONIDE SCALP 0.01 % OIL     .  mometasone (NASONEX) 50 MCG/ACT nasal spray 2 sprays each nostril q day 17 g 0  . Multiple Vitamin (MULTIVITAMIN) LIQD Take 5 mLs by mouth daily.    Marland Kitchen zonisamide (ZONEGRAN) 100 MG capsule Take 1 capsule (100 mg total) by mouth 2 (two) times daily. 60 capsule 5  . omeprazole (PRILOSEC) 40 MG capsule Take 1 capsule (40 mg total) by mouth daily. 90 capsule 3   No facility-administered medications prior to visit.    Allergies  Allergen Reactions  . Acetaminophen Itching  . Hydrocodone Nausea Only  . Oxycodone Itching  . Phosphate Itching    sick  .  Codeine Itching and Nausea Only    Family History  Problem Relation Age of Onset  . Diabetes      grandmother  . Hypertension Maternal Aunt     several family members  . Throat cancer Maternal Uncle   . Colon cancer Neg Hx   . Heart attack Neg Hx   . Throat cancer Mother   . Heart disease Father   . Breast cancer Maternal Aunt     breast  . Breast cancer Maternal Aunt     breast  . Pancreatic cancer Maternal Aunt   . Asthma      cousin, maternal  . Breast cancer Maternal Aunt     total of 5 aunts  . Breast cancer Maternal Aunt     Social History  Substance Use Topics  . Smoking status: Never Smoker   . Smokeless tobacco: Never Used  . Alcohol Use: 0.0 oz/week    0 Standard drinks or equivalent per week     Comment: socially     ROS: As per history of present illness, otherwise negative  BP 108/62 mmHg  Pulse 72  Ht 5\' 2"  (1.575 m)  Wt 191 lb 6 oz (86.807 kg)  BMI 34.99 kg/m2  LMP 01/09/2015 Constitutional: Well-developed and well-nourished. No distress. HEENT: Normocephalic and atraumatic. Oropharynx is clear and moist. No oropharyngeal exudate. Conjunctivae are normal.  No scleral icterus. Neck: Neck supple. Trachea midline. Cardiovascular: Normal rate, regular rhythm and intact distal pulses. No M/R/G Pulmonary/chest: Effort normal and breath sounds normal. No wheezing, rales or rhonchi. Abdominal: Soft, mild epigastric tenderness without rebound or guarding, nondistended. Bowel sounds active throughout. No hepatosplenomegaly. Extremities: no clubbing, cyanosis, or edema Lymphadenopathy: No cervical adenopathy noted. Neurological: Alert and oriented to person place and time. Skin: Skin is warm and dry. No rashes noted. Psychiatric: Normal mood and affect. Behavior is normal.  RELEVANT LABS AND IMAGING: CBC    Component Value Date/Time   WBC 8.7 01/12/2015 1656   RBC 4.29 01/12/2015 1656   HGB 12.5 01/12/2015 1656   HCT 37.8 01/12/2015 1656   PLT 305.0  01/12/2015 1656   MCV 88.2 01/12/2015 1656   MCH 30.0 10/29/2013 2145   MCHC 32.9 01/12/2015 1656   RDW 13.4 01/12/2015 1656   LYMPHSABS 2.2 01/12/2015 1656   MONOABS 0.5 01/12/2015 1656   EOSABS 0.1 01/12/2015 1656   BASOSABS 0.1 01/12/2015 1656    CMP     Component Value Date/Time   NA 138 01/12/2015 1656   K 3.8 01/12/2015 1656   CL 106 01/12/2015 1656   CO2 24 01/12/2015 1656   GLUCOSE 79 01/12/2015 1656   BUN 14 01/12/2015 1656   CREATININE 0.88 01/12/2015 1656   CALCIUM 8.7 01/12/2015 1656   PROT 6.9 05/14/2014 1223   ALBUMIN 3.5 05/14/2014 1223   AST 21 05/14/2014 1223  ALT 30 05/14/2014 1223   ALKPHOS 48 05/14/2014 1223   BILITOT 0.4 05/14/2014 1223   GFRNONAA >90 01/03/2013 2340   GFRAA >90 01/03/2013 2340   H pylori ab neg  ULTRASOUND ABDOMEN COMPLETE   COMPARISON:  None.   FINDINGS: Gallbladder:   No gallstones or wall thickening visualized. No sonographic Murphy sign noted.   Common bile duct:   Diameter: 3.5 mm   Liver:   No focal lesion identified. Within normal limits in parenchymal echogenicity.   IVC:   No abnormality visualized.   Pancreas:   Visualized portion unremarkable.   Spleen:   Size and appearance within normal limits.   Right Kidney:   Length: 9.6 cm. Echogenicity within normal limits. No mass or hydronephrosis visualized.   Left Kidney:   Length: 9.9 cm. Echogenicity within normal limits. No mass or hydronephrosis visualized.   Abdominal aorta:   No aneurysm visualized.   Other findings:   None.   IMPRESSION: Normal     Electronically Signed   By: Franchot Gallo M.D.   On: 12/12/2013 09:27  ASSESSMENT/PLAN: 44 year old female with a past medical history of migraines, endometriosis and GERD versus dyspepsia who is seen in consultation at the request of Dr. Etter Sjogren to evaluate dyspepsia and constipation.   1. Epigastric pain/nausea -- epigastric pain and nausea now unresponsive to PPI. For this  reason I recommended upper endoscopy. We discussed the risks, benefits and alternatives and she is agreeable to proceed. It seems that omeprazole has lost prior efficacy and will switched pantoprazole 40 mg 30 minutes before breakfast to see if this is more effective. I asked that she continue to avoid NSAIDs. Zofran 4 mg every 6 hours as needed for nausea. Ultrasound performed last year revealed no evidence of gallstones. If upper endoscopy is unremarkable consider CCK HIDA scan to evaluate for biliary dyskinesia source for pain and nausea.  2. Chronic constipation -- discussed the importance of high-fiber diet as well as liberal fluid intake. We discussed medication options and she is insulin Linzess. Will try Linzess 145 g daily. Main side effect is diarrhea, call if this is occurring. She may require higher dose and if medication ineffective we'll plan to increase to 290 g daily.  3. CRC screening -- colonoscopy recommended at age 35, avg risk with no known family history of colon cancer    OI:[address], [address], [place] [id number]

## 2015-01-26 NOTE — Patient Instructions (Signed)
You have been scheduled for an endoscopy. Please follow written instructions given to you at your visit today. If you use inhalers (even only as needed), please bring them with you on the day of your procedure. Your physician has requested that you go to www.startemmi.com and enter the access code given to you at your visit today. This web site gives a general overview about your procedure. However, you should still follow specific instructions given to you by our office regarding your preparation for the procedure.  We have sent the following medications to your pharmacy for you to pick up at your convenience: Pantoprazole 40 mg daily Linzess 145 mcg daily Zofran 4 mg ODT-Every 8 hours as needed  Please discontinue Prilosec.

## 2015-02-03 ENCOUNTER — Telehealth: Payer: Self-pay | Admitting: Internal Medicine

## 2015-02-03 ENCOUNTER — Ambulatory Visit (AMBULATORY_SURGERY_CENTER): Payer: BLUE CROSS/BLUE SHIELD | Admitting: Internal Medicine

## 2015-02-03 ENCOUNTER — Encounter: Payer: Self-pay | Admitting: Internal Medicine

## 2015-02-03 VITALS — BP 121/78 | HR 79 | Temp 98.1°F | Resp 19 | Ht 62.0 in | Wt 191.0 lb

## 2015-02-03 DIAGNOSIS — K219 Gastro-esophageal reflux disease without esophagitis: Secondary | ICD-10-CM

## 2015-02-03 DIAGNOSIS — R1013 Epigastric pain: Secondary | ICD-10-CM | POA: Diagnosis not present

## 2015-02-03 DIAGNOSIS — K2950 Unspecified chronic gastritis without bleeding: Secondary | ICD-10-CM | POA: Diagnosis not present

## 2015-02-03 MED ORDER — NALTREXONE-BUPROPION HCL ER 8-90 MG PO TB12: 2.0000 | ORAL_TABLET | Freq: Two times a day (BID) | ORAL | Status: DC

## 2015-02-03 MED ORDER — METOCLOPRAMIDE HCL 5 MG PO TABS: ORAL_TABLET | ORAL | Status: DC

## 2015-02-03 MED ORDER — SODIUM CHLORIDE 0.9 % IV SOLN: 500.0000 mL | INTRAVENOUS | Status: DC

## 2015-02-03 NOTE — Telephone Encounter (Signed)
Please advise, I do not see this on the med list?

## 2015-02-03 NOTE — Progress Notes (Signed)
Report to PACU, RN, vss, BBS= Clear.  

## 2015-02-03 NOTE — Patient Instructions (Signed)
Biopsies taken today. Pick up new medication from your pharmacy.  Call office in 2 weeks for update on how you are doing.    YOU HAD AN ENDOSCOPIC PROCEDURE TODAY AT [facility]:   Refer to the procedure report that was given to you for any specific questions about what was found during the examination.  If the procedure report does not answer your questions, please call your gastroenterologist to clarify.  If you requested that your care partner not be given the details of your procedure findings, then the procedure report has been included in a sealed envelope for you to review at your convenience later.  YOU SHOULD EXPECT: Some feelings of bloating in the abdomen. Passage of more gas than usual.  Walking can help get rid of the air that was put into your GI tract during the procedure and reduce the bloating. If you had a lower endoscopy (such as a colonoscopy or flexible sigmoidoscopy) you may notice spotting of blood in your stool or on the toilet paper. If you underwent a bowel prep for your procedure, you may not have a normal bowel movement for a few days.  Please Note:  You might notice some irritation and congestion in your nose or some drainage.  This is from the oxygen used during your procedure.  There is no need for concern and it should clear up in a day or so.  SYMPTOMS TO REPORT IMMEDIATELY:    Following upper endoscopy (EGD)  Vomiting of blood or coffee ground material  New chest pain or pain under the shoulder blades  Painful or persistently difficult swallowing  New shortness of breath  Fever of 100F or higher  Black, tarry-looking stools  For urgent or emergent issues, a gastroenterologist can be reached at any hour by calling [phone].   DIET: Your first meal following the procedure should be a small meal and then it is ok to progress to your normal diet. Heavy or fried foods are harder to digest and may make you feel nauseous or bloated.   Likewise, meals heavy in dairy and vegetables can increase bloating.  Drink plenty of fluids but you should avoid alcoholic beverages for 24 hours.  ACTIVITY:  You should plan to take it easy for the rest of today and you should NOT DRIVE or use heavy machinery until tomorrow (because of the sedation medicines used during the test).    FOLLOW UP: Our staff will call the number listed on your records the next business day following your procedure to check on you and address any questions or concerns that you may have regarding the information given to you following your procedure. If we do not reach you, we will leave a message.  However, if you are feeling well and you are not experiencing any problems, there is no need to return our call.  We will assume that you have returned to your regular daily activities without incident.  If any biopsies were taken you will be contacted by phone or by letter within the next 1-3 weeks.  Please call us at [phone] if you have not heard about the biopsies in 3 weeks.    SIGNATURES/CONFIDENTIALITY: You and/or your care partner have signed paperwork which will be entered into your electronic medical record.  These signatures attest to the fact that that the information above on your After Visit Summary has been reviewed and is understood.  Full responsibility of the confidentiality of this discharge information  lies with you and/or your care-partner.

## 2015-02-03 NOTE — Telephone Encounter (Signed)
Medication filled and printed for provider.

## 2015-02-03 NOTE — Telephone Encounter (Signed)
It was taken off her med list---- on to refill contrave 2 po bid  #120

## 2015-02-03 NOTE — Telephone Encounter (Signed)
Relation to pt: self Call back number:[phone] Pharmacy: [id number] - [place], [address]. [phone] (Phone) [phone] (Fax)         Reason for call:   Patient states she was seen by Dr. Etter Sjogren 01/12/15 and Minette Headland weight loss medication was prescribed, patient requesting a refill.

## 2015-02-03 NOTE — Op Note (Signed)
[place]  [address] [place], [id number]   ENDOSCOPY PROCEDURE REPORT  PATIENT: Morgan, Long  MR#: [id number] BIRTHDATE: 07-29-1970 , 23  yrs. old GENDER: female ENDOSCOPIST: Jerene Bears, MD REFERRED BY:  Garnet Koyanagi, DO PROCEDURE DATE:  02/03/2015 PROCEDURE:  EGD, diagnostic ASA CLASS:     Class II INDICATIONS:  epigastric pain and nausea. MEDICATIONS: Monitored anesthesia care and Propofol 200 mg IV TOPICAL ANESTHETIC: none  DESCRIPTION OF PROCEDURE: After the risks benefits and alternatives of the procedure were thoroughly explained, informed consent was obtained.  The LB [id number] endoscope was introduced through the mouth and advanced to the second portion of the duodenum , Without limitations.  The instrument was slowly withdrawn as the mucosa was fully examined.   ESOPHAGUS: The mucosa of the esophagus appeared normal.   Z-line regular.  STOMACH: There was a moderate amount of residual food seen in the gastric body.  Due to the residual food, complete mucosal examination could not be performed in this segment.  Based on this, I suspect the patient has some level of gastroparesis.   The stomach mucosa that was visualized otherwise appeared normal; biopsies of gastric body, antrum and incisura to exclude H. pylori.   DUODENUM: The duodenal mucosa showed no abnormalities in the bulb and 2nd part of the duodenum.  Retroflexed views revealed no abnormalities.     The scope was then withdrawn from the patient and the procedure completed.  COMPLICATIONS: There were no immediate complications.     ENDOSCOPIC IMPRESSION: 1.   The mucosa of the esophagus appeared normal 2.   Food residue in the gastric body suggestive of gastroparesis 3.   The stomach otherwise appeared normal; multiple biopsies 4.   The duodenal mucosa showed no abnormalities in the bulb and 2nd part of the duodenum  RECOMMENDATIONS: 1.  Await biopsy results 2.   Trial of metoclopramide 5 mg before meals and at bedtime for 1-2 weeks.  If no improvement in upper GI symptoms, would recommend CCK HIDA scan. 3.  Call in 2 weeks for update on your overall condition.  If symptoms improve would discontinue Reglan, continue pantoprazole 40 mg and observe.  eSigned:  Jerene Bears, MD 02/03/2015 8:22 AM    FA:OZHYQM Salem Caster, DO and The Patient  PATIENT NAME:  Morgan, Long MR#: [id number]

## 2015-02-03 NOTE — Progress Notes (Signed)
Called to room to assist during endoscopic procedure.  Patient ID and intended procedure confirmed with present staff. Received instructions for my participation in the procedure from the performing physician.  

## 2015-02-04 ENCOUNTER — Telehealth: Payer: Self-pay

## 2015-02-04 NOTE — Telephone Encounter (Signed)
  Follow up Call-  Call back number 02/03/2015  Post procedure Call Back phone  # [phone]  Permission to leave phone message Yes     Patient questions:  Do you have a fever, pain , or abdominal swelling? No. Pain Score  0 *  Have you tolerated food without any problems? Yes.    Have you been able to return to your normal activities? Yes.    Do you have any questions about your discharge instructions: Diet   No. Medications  No. Follow up visit  No.  Do you have questions or concerns about your Care? No.  Actions: * If pain score is 4 or above: No action needed, pain <4.

## 2015-02-09 ENCOUNTER — Encounter: Payer: Self-pay | Admitting: Internal Medicine

## 2015-02-10 ENCOUNTER — Telehealth: Payer: Self-pay | Admitting: Internal Medicine

## 2015-02-10 NOTE — Telephone Encounter (Signed)
Spoke with pt and she is aware.

## 2015-02-10 NOTE — Telephone Encounter (Signed)
Let's give the medication a bit longer to work and reassess in about 2 weeks

## 2015-02-10 NOTE — Telephone Encounter (Signed)
EGD results reviewed with pt. Pt states she is not having nausea since starting the reglan. Pt states she is still having tenderness and her stomach is still sore and she is a little swollen. Please advise.

## 2015-02-18 NOTE — Telephone Encounter (Signed)
Pt calling to f/u on PA for contrave. Pt states pharmacy notified her they were waiting for [name] to obtain PA.  Best # for pt [phone] (M-F 8-5)

## 2015-02-19 NOTE — Telephone Encounter (Signed)
Never received a request from pharmacy. Will work on it today

## 2015-02-19 NOTE — Telephone Encounter (Signed)
PA initiated. Awaiting determination. JG//CMA 

## 2015-02-23 ENCOUNTER — Ambulatory Visit (INDEPENDENT_AMBULATORY_CARE_PROVIDER_SITE_OTHER): Payer: BLUE CROSS/BLUE SHIELD | Admitting: Internal Medicine

## 2015-02-23 ENCOUNTER — Encounter: Payer: Self-pay | Admitting: Internal Medicine

## 2015-02-23 VITALS — BP 126/82 | HR 83 | Temp 98.7°F | Ht 62.0 in | Wt 190.0 lb

## 2015-02-23 DIAGNOSIS — J029 Acute pharyngitis, unspecified: Secondary | ICD-10-CM

## 2015-02-23 DIAGNOSIS — Z113 Encounter for screening for infections with a predominantly sexual mode of transmission: Secondary | ICD-10-CM | POA: Diagnosis not present

## 2015-02-23 DIAGNOSIS — J020 Streptococcal pharyngitis: Secondary | ICD-10-CM

## 2015-02-23 DIAGNOSIS — Z09 Encounter for follow-up examination after completed treatment for conditions other than malignant neoplasm: Secondary | ICD-10-CM | POA: Insufficient documentation

## 2015-02-23 MED ORDER — NALTREXONE-BUPROPION HCL ER 8-90 MG PO TB12: 2.0000 | ORAL_TABLET | Freq: Two times a day (BID) | ORAL | Status: DC

## 2015-02-23 NOTE — Progress Notes (Signed)
Subjective:    Patient ID: Morgan Long, female    DOB: 08/13/1970, 44 y.o.   MRN: [id number]  DOS:  02/23/2015 Type of visit - description : Acute visit Interval history: Few days history of mild sore throat, initially on the right side, now also involving the midportion of the throat. She is concerned because symptoms started  3 days after unprotected oral sex. Also, has been in contact w/  Sick people with URI. 2 weeks ago developed some chest tightness, no chest pain, just a "weird feeling with deep breaths".   Review of Systems No fever chills, occasional nausea, no vomiting diarrhea No cough or sputum production No wheezing. No recent prolonged car trip or airplane trip. No recent leg swelling  Past Medical History  Diagnosis Date  . Endometriosis   . Headache(784.0)     occasional, dx w/ Migraines before, Topamax helps  . TMJ pain dysfunction syndrome     occasional  . Allergic rhinitis   . Sinusitis   . GERD (gastroesophageal reflux disease)   . Chronic migraine     Dr. Catalina Gravel    Past Surgical History  Procedure Laterality Date  . Fallopian tube removed      Left  . Breast enhancement surgery  2006  . Endrometroisis      Social History   Social History  . Marital Status: Single    Spouse Name: N/A  . Number of Children: 0  . Years of Education: BS   Occupational History  . 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 Smoking status: Never Smoker   . Smokeless tobacco: Never Used  . Alcohol Use: 0.0 oz/week    0 Standard drinks or equivalent per week     Comment: socially   . Drug Use: No  . Sexual Activity: No   Other Topics Concern  . Not on file   Social History Narrative   Household:sister and her 3 kids               Medication List       This list is accurate as of: 02/23/15  5:41 PM.  Always use your most recent med list.               azelastine 0.1 % nasal spray    Commonly known as:  ASTELIN  Place 2 sprays into both nostrils daily as needed for rhinitis. Use in each nostril as directed     clindamycin 1 % external solution  Commonly known as:  CLEOCIN T     cyclobenzaprine 10 MG tablet  Commonly known as:  FLEXERIL  TAKE 1 TABLET BY MOUTH AT BEDTIME AS NEEDED FOR HEADACHE & TMJ     DRYSOL 20 % external solution  Generic drug:  aluminum chloride     estradiol 1.53 MG/SPRAY transdermal spray  Commonly known as:  EVAMIST  Place 1 spray onto the skin daily.     etonogestrel-ethinyl estradiol 0.12-0.015 MG/24HR vaginal ring  Commonly known as:  [place] 1 each vaginally every 28 (twenty-eight) days. Insert vaginally and leave in place for 3 consecutive weeks, then remove for 1 week.     FLUOCINOLONE ACETONIDE SCALP 0.01 % Oil     Linaclotide 145 MCG Caps capsule  Commonly known as:  LINZESS  Take 1 capsule (145 mcg total) by mouth daily.     metoCLOPramide 5 MG tablet  Commonly known as:  REGLAN  1 tablet before meals and bedtime for 2 weeks     mometasone 50 MCG/ACT nasal spray  Commonly known as:  NASONEX  2 sprays each nostril q day     MONODOX 100 MG capsule  Generic drug:  doxycycline  Take 100 mg by mouth 2 (two) times daily.     multivitamin Liqd  Take 5 mLs by mouth daily.     Naltrexone-Bupropion HCl ER 8-90 MG Tb12  Take 2 tablets by mouth 2 (two) times daily.     ondansetron 4 MG disintegrating tablet  Commonly known as:  ZOFRAN ODT  Take 1 tablet (4 mg total) by mouth every 8 (eight) hours as needed for nausea or vomiting.     pantoprazole 40 MG tablet  Commonly known as:  PROTONIX  Take 1 tablet (40 mg total) by mouth daily.     terconazole 0.8 % vaginal cream  Commonly known as:  TERAZOL 3  INSERT 1 APPLICATORFUL INTRAVAGINALLY AT BEDTIME NIGHTLY.     zonisamide 100 MG capsule  Commonly known as:  ZONEGRAN  Take 1 capsule (100 mg total) by mouth 2 (two) times daily.           Objective:    Physical Exam BP 126/82 mmHg  Pulse 83  Temp(Src) 98.7 F (37.1 C) (Oral)  Ht 5\' 2"  (1.575 m)  Wt 190 lb (86.183 kg)  BMI 34.74 kg/m2  SpO2 99%  LMP 01/24/2015 (Approximate) General:   Well developed, well nourished . NAD.  HEENT:  Normocephalic . Face symmetric, atraumatic TMs normal, nose not congested, throat symmetric, tonsils not enlarged, no discharge, minimal redness if any. Neck: No LAD is Lungs:  CTA B Normal respiratory effort, no intercostal retractions, no accessory muscle use. Heart: RRR,  no murmur.  No pretibial edema bilaterally  Skin: Not pale. Not jaundice Neurologic:  alert & oriented X3.  Speech normal, gait appropriate for age and unassisted Psych--  Cognition and judgment appear intact.  Cooperative with normal attention span and concentration.  Behavior appropriate. No anxious or depressed appearing.      Assessment & Plan:   Sore throat: Check a strep cx, also patient likes to rule out STDs thus check a G&C. If the strep cx + will need abx, otherwise  will let patient go w/  instructions for a viral URI. Safe sex strongly recommended. Check a HIV, RPR, hep C.

## 2015-02-23 NOTE — Progress Notes (Signed)
Pre visit review using our clinic review tool, if applicable. No additional management support is needed unless otherwise documented below in the visit note. 

## 2015-02-23 NOTE — Patient Instructions (Addendum)
Please go to the lab for blood work  Rest, fluids , tylenol  For cough: Take Mucinex DM twice a day as needed until better  For nasal congestion Use Astelin, on off of the nose sprays you have at home  Call if not gradually better over the next  10 days  Call anytime if the symptoms are severe   Safe Sex Safe sex is about reducing the risk of giving or getting a sexually transmitted disease (STD). STDs are spread through sexual contact involving the genitals, mouth, or rectum. Some STDs can be cured and others cannot. Safe sex can also prevent unintended pregnancies.  WHAT ARE SOME SAFE SEX PRACTICES?  Limit your sexual activity to only one partner who is having sex with only you.  Talk to your partner about his or her past partners, past STDs, and drug use.  Use a condom every time you have sexual intercourse. This includes vaginal, oral, and anal sexual activity. Both females and males should wear condoms during oral sex. Only use latex or polyurethane condoms and water-based lubricants. Using petroleum-based lubricants or oils to lubricate a condom will weaken the condom and increase the chance that it will break. The condom should be in place from the beginning to the end of sexual activity. Wearing a condom reduces, but does not completely eliminate, your risk of getting or giving an STD. STDs can be spread by contact with infected body fluids and skin.  Get vaccinated for hepatitis B and HPV.  Avoid alcohol and recreational drugs, which can affect your judgment. You may forget to use a condom or participate in high-risk sex.  For females, avoid douching after sexual intercourse. Douching can spread an infection farther into the reproductive tract.  Check your body for signs of sores, blisters, rashes, or unusual discharge. See your health care provider if you notice any of these signs.  Avoid sexual contact if you have symptoms of an infection or are being treated for an STD. If  you or your partner has herpes, avoid sexual contact when blisters are present. Use condoms at all other times.  If you are at risk of being infected with HIV, it is recommended that you take a prescription medicine daily to prevent HIV infection. This is called pre-exposure prophylaxis (PrEP). You are considered at risk if:  You are a man who has sex with other men (MSM).  You are a heterosexual man or woman who is sexually active with more than one partner.  You take drugs by injection.  You are sexually active with a partner who has HIV.  Talk with your health care provider about whether you are at high risk of being infected with HIV. If you choose to begin PrEP, you should first be tested for HIV. You should then be tested every 3 months for as long as you are taking PrEP.  See your health care provider for regular screenings, exams, and tests for other STDs. Before having sex with a new partner, each of you should be screened for STDs and should talk about the results with each other. WHAT ARE THE BENEFITS OF SAFE SEX?   There is less chance of getting or giving an STD.  You can prevent unwanted or unintended pregnancies.  By discussing safe sex concerns with your partner, you may increase feelings of intimacy, comfort, trust, and honesty between the two of you. Document Released: 06/29/2004 Document Revised: 10/06/2013 Document Reviewed: 11/13/2011 [facility] Patient Information 2015 [place], [place]. This  information is not intended to replace advice given to you by your health care provider. Make sure you discuss any questions you have with your health care provider.  

## 2015-02-23 NOTE — Assessment & Plan Note (Signed)
Sore throat: Check a strep cx, also patient likes to rule out STDs thus check a G&C. If the strep cx + will need abx, otherwise  will let patient go w/  instructions for a viral URI. Safe sex strongly recommended. Check a HIV, RPR, hep C.

## 2015-02-23 NOTE — Telephone Encounter (Signed)
PA approved.

## 2015-02-24 LAB — HEPATITIS C ANTIBODY: HCV Ab: NEGATIVE

## 2015-02-24 LAB — GC/CHLAMYDIA PROBE AMP, URINE
Chlamydia, Swab/Urine, PCR: NEGATIVE
GC Probe Amp, Urine: NEGATIVE

## 2015-02-24 LAB — HIV ANTIBODY (ROUTINE TESTING W REFLEX): HIV 1&2 Ab, 4th Generation: NONREACTIVE

## 2015-02-24 LAB — RPR

## 2015-02-25 LAB — CULTURE, GROUP A STREP: Organism ID, Bacteria: NORMAL

## 2015-02-26 ENCOUNTER — Telehealth: Payer: Self-pay | Admitting: Internal Medicine

## 2015-02-26 NOTE — Telephone Encounter (Signed)
Recommend to give it more time, consistently use OTC Flonase in the mornings and Astelin at night. If not better in 4 or 5 days call the office, antibiotics? She won't  need to be seen again.

## 2015-02-26 NOTE — Telephone Encounter (Signed)
Please advise 

## 2015-02-26 NOTE — Telephone Encounter (Signed)
Spoke with Pt, informed her of lab results. She states that she has had a sinus infection before and knows she has one. She states her mucus is a dark brown/yellowish color, Pt denies fever, chills, N/V. I informed I would let Dr. Larose Kells know but she may need to be seen again for antibiotics. Pt became frustrated and didn't understand why she would need to be seen again since she was just seen. I informed her that I didn't know that for sure but would send a message to Dr. Larose Kells and will call her back when I hear back. Pt verbalized understanding and hung up the phone.

## 2015-02-26 NOTE — Telephone Encounter (Signed)
Pt called stating she has gotten worse. She asked what her labs show. Please call [phone].

## 2015-02-26 NOTE — Telephone Encounter (Signed)
LMOM informing Pt of Dr. Larose Kells recommendations. Instructed her over weekend if fever, chills, N/V go to urgent care or ED.

## 2015-02-26 NOTE — Telephone Encounter (Signed)
Advise patient: Blood and throat tests were normal. Good results. I don't see evidence of any infection, recommend Tylenol or Motrin as needed, call if no gradually improving in the next week

## 2015-03-01 ENCOUNTER — Ambulatory Visit (INDEPENDENT_AMBULATORY_CARE_PROVIDER_SITE_OTHER): Payer: BLUE CROSS/BLUE SHIELD | Admitting: Internal Medicine

## 2015-03-01 ENCOUNTER — Encounter: Payer: Self-pay | Admitting: Internal Medicine

## 2015-03-01 ENCOUNTER — Ambulatory Visit (HOSPITAL_BASED_OUTPATIENT_CLINIC_OR_DEPARTMENT_OTHER)
Admission: RE | Admit: 2015-03-01 | Discharge: 2015-03-01 | Disposition: A | Discharge status: Home / Self Care | Payer: BLUE CROSS/BLUE SHIELD | Source: Ambulatory Visit | Attending: Internal Medicine | Admitting: Internal Medicine

## 2015-03-01 VITALS — BP 106/74 | HR 96 | Temp 99.2°F | Resp 18 | Wt 189.0 lb

## 2015-03-01 DIAGNOSIS — R059 Cough, unspecified: Secondary | ICD-10-CM

## 2015-03-01 DIAGNOSIS — Z09 Encounter for follow-up examination after completed treatment for conditions other than malignant neoplasm: Secondary | ICD-10-CM

## 2015-03-01 DIAGNOSIS — R918 Other nonspecific abnormal finding of lung field: Secondary | ICD-10-CM | POA: Diagnosis not present

## 2015-03-01 DIAGNOSIS — R0989 Other specified symptoms and signs involving the circulatory and respiratory systems: Secondary | ICD-10-CM | POA: Insufficient documentation

## 2015-03-01 DIAGNOSIS — R0789 Other chest pain: Secondary | ICD-10-CM | POA: Insufficient documentation

## 2015-03-01 DIAGNOSIS — R05 Cough: Secondary | ICD-10-CM

## 2015-03-01 DIAGNOSIS — J40 Bronchitis, not specified as acute or chronic: Secondary | ICD-10-CM | POA: Diagnosis not present

## 2015-03-01 MED ORDER — AMOXICILLIN 500 MG PO CAPS: 1000.0000 mg | ORAL_CAPSULE | Freq: Two times a day (BID) | ORAL | Status: DC

## 2015-03-01 MED ORDER — FLUCONAZOLE 150 MG PO TABS: 150.0000 mg | ORAL_TABLET | Freq: Every day | ORAL | Status: DC

## 2015-03-01 NOTE — Assessment & Plan Note (Signed)
Was seen last week with sore throat, workup was negative, now has sinusitis and bronchitis on clinical grounds. Patient quite concerned about chest tightness. We'll check a chest x-ray and treat for sinusitis, see instructions.

## 2015-03-01 NOTE — Progress Notes (Signed)
Pre visit review using our clinic review tool, if applicable. No additional management support is needed unless otherwise documented below in the visit note. 

## 2015-03-01 NOTE — Patient Instructions (Signed)
Stop by the first floor and get the XR   Rest, fluids, Tylenol if needed For nasal congestion: Astelin nose spray twice a day For cough: Mucinex DM OTC as needed Take antibiotics amoxicillin for 10 days  if you develop a vaginal yeast infection, start Diflucan which is already your pharmacy. Call if you have severe symptoms such as high fever, chest pain, difficulty breathing.  Call if not gradually improving

## 2015-03-01 NOTE — Progress Notes (Signed)
Subjective:    Patient ID: Morgan Long, female    DOB: [birth year], 44 y.o.   MRN: [id number]  DOS:  03/01/2015 Type of visit - description : Acute Interval history: Was seen a few days ago with sore throat, workup was negative, since then she continue with the same symptoms and is getting worse: More sinus congestion, blowing brown bloody discharge from the nose, bilateral ear pain. Also had a temperature up to 102.0. She continue with a odd feeling in the chest, hard to describe, like a tightness, only actual pain when she coughs. Wheezing? Patient no sure Poor appetite. Had nausea once. No vomiting or diarrhea. Continue with cough. + Brown mucus with some blood?.    Review of Systems   Past Medical History  Diagnosis Date  . Endometriosis   . Headache(784.0)     occasional, dx w/ Migraines before, Topamax helps  . TMJ pain dysfunction syndrome     occasional  . Allergic rhinitis   . Sinusitis   . GERD (gastroesophageal reflux disease)   . Chronic migraine     Dr. Catalina Gravel    Past Surgical History  Procedure Laterality Date  . Fallopian tube removed      Left  . Breast enhancement surgery  2006  . Endrometroisis      Social History   Social History  . Marital Status: Single    Spouse Name: N/A  . Number of Children: 0  . Years of Education: BS   Occupational History  . 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 Smoking status: Never Smoker   . Smokeless tobacco: Never Used  . Alcohol Use: 0.0 oz/week    0 Standard drinks or equivalent per week     Comment: socially   . Drug Use: No  . Sexual Activity: No   Other Topics Concern  . Not on file   Social History Narrative   Household:sister and her 3 kids               Medication List       This list is accurate as of: 03/01/15 12:17 PM.  Always use your most recent med list.               amoxicillin 500 MG capsule  Commonly  known as:  AMOXIL  Take 2 capsules (1,000 mg total) by mouth 2 (two) times daily.     azelastine 0.1 % nasal spray  Commonly known as:  ASTELIN  Place 2 sprays into both nostrils daily as needed for rhinitis. Use in each nostril as directed     clindamycin 1 % external solution  Commonly known as:  CLEOCIN T     cyclobenzaprine 10 MG tablet  Commonly known as:  FLEXERIL  TAKE 1 TABLET BY MOUTH AT BEDTIME AS NEEDED FOR HEADACHE & TMJ     DRYSOL 20 % external solution  Generic drug:  aluminum chloride     estradiol 1.53 MG/SPRAY transdermal spray  Commonly known as:  EVAMIST  Place 1 spray onto the skin daily.     etonogestrel-ethinyl estradiol 0.12-0.015 MG/24HR vaginal ring  Commonly known as:  [place] 1 each vaginally every 28 (twenty-eight) days. Insert vaginally and leave in place for 3 consecutive weeks, then remove for 1 week.     fluconazole 150 MG tablet  Commonly known as:  DIFLUCAN  Take 1 tablet (150  mg total) by mouth daily.     FLUOCINOLONE ACETONIDE SCALP 0.01 % Oil     Linaclotide 145 MCG Caps capsule  Commonly known as:  LINZESS  Take 1 capsule (145 mcg total) by mouth daily.     metoCLOPramide 5 MG tablet  Commonly known as:  REGLAN  1 tablet before meals and bedtime for 2 weeks     mometasone 50 MCG/ACT nasal spray  Commonly known as:  NASONEX  2 sprays each nostril q day     MONODOX 100 MG capsule  Generic drug:  doxycycline  Take 100 mg by mouth 2 (two) times daily.     multivitamin Liqd  Take 5 mLs by mouth daily.     Naltrexone-Bupropion HCl ER 8-90 MG Tb12  Take 2 tablets by mouth 2 (two) times daily.     ondansetron 4 MG disintegrating tablet  Commonly known as:  ZOFRAN ODT  Take 1 tablet (4 mg total) by mouth every 8 (eight) hours as needed for nausea or vomiting.     pantoprazole 40 MG tablet  Commonly known as:  PROTONIX  Take 1 tablet (40 mg total) by mouth daily.     terconazole 0.8 % vaginal cream  Commonly known as:   TERAZOL 3  INSERT 1 APPLICATORFUL INTRAVAGINALLY AT BEDTIME NIGHTLY.     zonisamide 100 MG capsule  Commonly known as:  ZONEGRAN  Take 1 capsule (100 mg total) by mouth 2 (two) times daily.           Objective:   Physical Exam BP 106/74 mmHg  Pulse 96  Temp(Src) 99.2 F (37.3 C) (Oral)  Resp 18  Wt 189 lb (85.73 kg)  SpO2 98%  LMP 01/24/2015 (Approximate) General:   Well developed, well nourished . NAD, nontoxic appearing.  HEENT:  Normocephalic . Face symmetric, atraumatic. Nose quite congested, all sinuses TTP : More so at the left maxillary area Lungs:  CTA B except for few rhonchi Normal respiratory effort, no intercostal retractions, no accessory muscle use. Heart: RRR,  no murmur.  No pretibial edema bilaterally  Skin: Not pale. Not jaundice Neurologic:  alert & oriented X3.  Speech normal, gait appropriate for age and unassisted Psych--  Cognition and judgment appear intact.  Cooperative with normal attention span and concentration.  Behavior appropriate. No anxious or depressed appearing.      Assessment & Plan:   Assessment > GERD Chronic migrainous Dr. Catalina Gravel, + help with Topamax Endometriosis Allergic rhinitis  Plan Was seen last week with sore throat, workup was negative, now has sinusitis and bronchitis on clinical grounds. Patient quite concerned about chest tightness. We'll check a chest x-ray and treat for sinusitis, see instructions.

## 2015-03-11 ENCOUNTER — Telehealth: Payer: Self-pay | Admitting: Internal Medicine

## 2015-03-11 NOTE — Telephone Encounter (Signed)
Pt called regarding MyChart message about her chest x-ray, she is still having cough, some chills, denies fever, and sinus congestion, she is requesting clinical advice and wanting to know if she should get more antibiotics. Please advise.

## 2015-03-11 NOTE — Telephone Encounter (Signed)
Pt was informed to call if she was not improving by Dr. Larose Kells. He stated if she was not improving she would need stronger antibiotics. Not needed if she is feeling better.

## 2015-03-11 NOTE — Telephone Encounter (Signed)
Caller name: Matilyn Fehrman   Relationship to patient: Self   Can be reached:[phone]  Pt called in because she says that she was told to follow up with you once taking meds to let you know how she's feeling. She says that she received a message in Mychart that she would need a stronger antibiotic. Pt would like to know what should she do? She's not sure if another appt is needed.   She would like a call back directly to advise further.

## 2015-03-11 NOTE — Telephone Encounter (Signed)
My apologies, pt isn't feeling better is the reason for her call.

## 2015-03-12 ENCOUNTER — Encounter: Payer: Self-pay | Admitting: Internal Medicine

## 2015-03-12 MED ORDER — AZITHROMYCIN 250 MG PO TABS: ORAL_TABLET | ORAL | Status: DC

## 2015-03-12 NOTE — Telephone Encounter (Signed)
Rx sent. Pt informed via MyChart.  

## 2015-03-12 NOTE — Telephone Encounter (Signed)
Advise patient, will do a second round of antibiotics, call in a  Z-Pak.

## 2015-03-15 NOTE — Telephone Encounter (Signed)
She seems to have improved, but not completely with reglan I recommend increasing dose to 10 mg TIDAC and HS and then schedule office followup for reassessment and to discuss how she is feeling

## 2015-03-19 ENCOUNTER — Encounter: Payer: Self-pay | Admitting: Internal Medicine

## 2015-03-19 ENCOUNTER — Emergency Department (HOSPITAL_BASED_OUTPATIENT_CLINIC_OR_DEPARTMENT_OTHER): Payer: BLUE CROSS/BLUE SHIELD

## 2015-03-19 ENCOUNTER — Emergency Department (HOSPITAL_BASED_OUTPATIENT_CLINIC_OR_DEPARTMENT_OTHER)
Admission: EM | Admit: 2015-03-19 | Discharge: 2015-03-19 | Disposition: A | Discharge status: Home / Self Care | Payer: BLUE CROSS/BLUE SHIELD | Attending: Emergency Medicine | Admitting: Emergency Medicine

## 2015-03-19 ENCOUNTER — Encounter (HOSPITAL_BASED_OUTPATIENT_CLINIC_OR_DEPARTMENT_OTHER): Payer: Self-pay | Admitting: Adult Health

## 2015-03-19 DIAGNOSIS — Z79899 Other long term (current) drug therapy: Secondary | ICD-10-CM | POA: Diagnosis not present

## 2015-03-19 DIAGNOSIS — Z8742 Personal history of other diseases of the female genital tract: Secondary | ICD-10-CM | POA: Insufficient documentation

## 2015-03-19 DIAGNOSIS — Z8679 Personal history of other diseases of the circulatory system: Secondary | ICD-10-CM | POA: Insufficient documentation

## 2015-03-19 DIAGNOSIS — Z8739 Personal history of other diseases of the musculoskeletal system and connective tissue: Secondary | ICD-10-CM | POA: Diagnosis not present

## 2015-03-19 DIAGNOSIS — K219 Gastro-esophageal reflux disease without esophagitis: Secondary | ICD-10-CM | POA: Insufficient documentation

## 2015-03-19 DIAGNOSIS — Z792 Long term (current) use of antibiotics: Secondary | ICD-10-CM | POA: Diagnosis not present

## 2015-03-19 DIAGNOSIS — Z8709 Personal history of other diseases of the respiratory system: Secondary | ICD-10-CM | POA: Insufficient documentation

## 2015-03-19 DIAGNOSIS — R091 Pleurisy: Secondary | ICD-10-CM | POA: Diagnosis not present

## 2015-03-19 DIAGNOSIS — R0602 Shortness of breath: Secondary | ICD-10-CM | POA: Diagnosis present

## 2015-03-19 LAB — BASIC METABOLIC PANEL
Anion gap: 7 (ref 5–15)
BUN: 16 mg/dL (ref 6–20)
CALCIUM: 8.7 mg/dL — AB (ref 8.9–10.3)
CO2: 22 mmol/L (ref 22–32)
CREATININE: 0.9 mg/dL (ref 0.44–1.00)
Chloride: 108 mmol/L (ref 101–111)
GFR calc Af Amer: >60 mL/min (ref >60)
GFR calc non Af Amer: >60 mL/min (ref >60)
GLUCOSE: 82 mg/dL (ref 65–99)
Potassium: 3.7 mmol/L (ref 3.5–5.1)
Sodium: 137 mmol/L (ref 135–145)

## 2015-03-19 LAB — CBC WITH DIFFERENTIAL/PLATELET
BASOS PCT: 0 %
Basophils Absolute: 0 10*3/uL (ref 0.0–0.1)
EOS ABS: 0 10*3/uL (ref 0.0–0.7)
EOS PCT: 0 %
HEMATOCRIT: 38.9 % (ref 36.0–46.0)
Hemoglobin: 13.1 g/dL (ref 12.0–15.0)
Lymphocytes Relative: 21 %
Lymphs Abs: 2.4 10*3/uL (ref 0.7–4.0)
MCH: 29 pg (ref 26.0–34.0)
MCHC: 33.7 g/dL (ref 30.0–36.0)
MCV: 86.1 fL (ref 78.0–100.0)
MONO ABS: 0.9 10*3/uL (ref 0.1–1.0)
MONOS PCT: 8 %
NEUTROS ABS: 8.1 10*3/uL — AB (ref 1.7–7.7)
Neutrophils Relative %: 71 %
PLATELETS: 377 10*3/uL (ref 150–400)
RBC: 4.52 MIL/uL (ref 3.87–5.11)
RDW: 13.2 % (ref 11.5–15.5)
WBC: 11.4 10*3/uL — ABNORMAL HIGH (ref 4.0–10.5)

## 2015-03-19 LAB — D-DIMER, QUANTITATIVE: D-Dimer, Quant: <0.27 ug/mL-FEU (ref 0.00–0.48)

## 2015-03-19 MED ORDER — KETOROLAC TROMETHAMINE 30 MG/ML IJ SOLN
30.0000 mg | Freq: Once | INTRAMUSCULAR | Status: AC
  Administered 2015-03-19: 30 mg via INTRAVENOUS
  Filled 2015-03-19: qty 1

## 2015-03-19 MED ORDER — NAPROXEN 500 MG PO TABS: 500.0000 mg | ORAL_TABLET | Freq: Two times a day (BID) | ORAL | Status: DC

## 2015-03-19 NOTE — ED Notes (Signed)
Presents with left sided chest pain radiating into back worse with breathing and SOB with productive cough brown phlegm- taking antibiotics for 10 days and has has mild symptoms fopr one month-today is worse and inable to eat, more SOB and chest pain developed.

## 2015-03-19 NOTE — Discharge Instructions (Signed)
Pleurisy  Pleurisy is an inflammation and swelling of the lining of the lungs (pleura). Because of this inflammation, it hurts to breathe. It can be aggravated by coughing, laughing, or deep breathing. Pleurisy is often caused by an underlying infection or disease.   HOME CARE INSTRUCTIONS   Monitor your pleurisy for any changes. The following actions may help to alleviate any discomfort you are experiencing:  · Medicine may help with pain. Only take over-the-counter or prescription medicines for pain, discomfort, or fever as directed by your health care provider.  · Only take antibiotic medicine as directed. Make sure to finish it even if you start to feel better.  SEEK MEDICAL CARE IF:   · Your pain is not controlled with medicine or is increasing.  · You have an increase in pus-like (purulent) secretions brought up with coughing.  SEEK IMMEDIATE MEDICAL CARE IF:   · You have blue or dark lips, fingernails, or toenails.  · You are coughing up blood.  · You have increased difficulty breathing.  · You have continuing pain unrelieved by medicine or pain lasting more than 1 week.  · You have pain that radiates into your neck, arms, or jaw.  · You develop increased shortness of breath or wheezing.  · You develop a fever, rash, vomiting, fainting, or other serious symptoms.  MAKE SURE YOU:  · Understand these instructions.    · Will watch your condition.    · Will get help right away if you are not doing well or get worse.        This information is not intended to replace advice given to you by your health care provider. Make sure you discuss any questions you have with your health care provider.     Document Released: 05/22/2005 Document Revised: 01/22/2013 Document Reviewed: 11/03/2012  [employer] Interactive Patient Education ©2016 [employer]

## 2015-03-19 NOTE — ED Provider Notes (Signed)
CSN: [id number]   Arrival date & time 03/19/15 2127  History  By signing my name below, I, Altamease Oiler, attest that this documentation has been prepared under the direction and in the presence of Malvin Johns, MD. Electronically Signed: Altamease Oiler, ED Scribe. 03/19/2015. 9:53 PM.  Chief Complaint  Patient presents with  . Shortness of Breath    HPI The history is provided by the patient. No language interpreter was used.   Morgan Long is a 44 y.o. female who presents to the Emergency Department complaining of new and constant left-sided chest pain that radiates to the back with onset yesterday. The pain is pleuritic and rated 9/10 in severity. Associated symptoms include generalized weakness, a cough productive of brown sputum (She has been on abx for the last 10 days (z-pack and amoxicillin) without improvement), SOB with exertion, intermittent nausea, and 1 episode of emesis. Pt denies fever and LE pain or swelling. No history of lung disease.   Past Medical History  Diagnosis Date  . Endometriosis   . Headache(784.0)     occasional, dx w/ Migraines before, Topamax helps  . TMJ pain dysfunction syndrome     occasional  . Allergic rhinitis   . Sinusitis   . GERD (gastroesophageal reflux disease)   . Chronic migraine     Dr. Catalina Gravel    Past Surgical History  Procedure Laterality Date  . Fallopian tube removed      Left  . Breast enhancement surgery  2006  . Endrometroisis      Family History  Problem Relation Age of Onset  . Diabetes      grandmother  . Hypertension Maternal Aunt     several family members  . Throat cancer Maternal Uncle   . Colon cancer Neg Hx   . Heart attack Neg Hx   . Throat cancer Mother   . Heart disease Father   . Breast cancer Maternal Aunt     breast  . Breast cancer Maternal Aunt     breast  . Pancreatic cancer Maternal Aunt   . Asthma      cousin, maternal  . Breast cancer Maternal Aunt     total of 5 aunts  . Breast  cancer Maternal Aunt     Social History  Substance Use Topics  . Smoking status: Never Smoker   . Smokeless tobacco: Never Used  . Alcohol Use: 0.0 oz/week    0 Standard drinks or equivalent per week     Comment: socially      Review of Systems  Constitutional: Positive for fatigue. Negative for fever, chills and diaphoresis.  HENT: Negative for congestion, rhinorrhea and sneezing.   Eyes: Negative.   Respiratory: Positive for cough. Negative for chest tightness and shortness of breath.   Cardiovascular: Positive for chest pain. Negative for leg swelling.  Gastrointestinal: Negative for nausea, vomiting, abdominal pain, diarrhea and blood in stool.  Genitourinary: Negative for frequency, hematuria, flank pain and difficulty urinating.  Musculoskeletal: Negative for back pain and arthralgias.  Skin: Negative for rash.  Neurological: Negative for dizziness, speech difficulty, weakness, numbness and headaches.   Home Medications   Prior to Admission medications   Medication Sig Start Date End Date Taking? Authorizing Provider  amoxicillin (AMOXIL) 500 MG capsule Take 2 capsules (1,000 mg total) by mouth 2 (two) times daily. 03/01/15   Colon Branch, MD  azelastine (ASTELIN) 0.1 % nasal spray Place 2 sprays into both nostrils daily as needed for rhinitis.  Use in each nostril as directed    Historical Provider, MD  azithromycin (ZITHROMAX) 250 MG tablet Take 2 tablets by mouth the first day of treatment and 1 tablet by mouth all remaining days. 03/12/15   Colon Branch, MD  clindamycin (CLEOCIN T) 1 % external solution  02/26/14   Historical Provider, MD  cyclobenzaprine (FLEXERIL) 10 MG tablet TAKE 1 TABLET BY MOUTH AT BEDTIME AS NEEDED FOR HEADACHE & TMJ 01/14/15   Rosalita Chessman, DO  doxycycline (MONODOX) 100 MG capsule Take 100 mg by mouth 2 (two) times daily.    Historical Provider, MD  DRYSOL 20 % external solution  02/25/14   Historical Provider, MD  estradiol (EVAMIST) 1.53 MG/SPRAY  transdermal spray Place 1 spray onto the skin daily.    Historical Provider, MD  etonogestrel-ethinyl estradiol (NUVARING) 0.12-0.015 MG/24HR vaginal ring Place 1 each vaginally every 28 (twenty-eight) days. Insert vaginally and leave in place for 3 consecutive weeks, then remove for 1 week.     Historical Provider, MD  fluconazole (DIFLUCAN) 150 MG tablet Take 1 tablet (150 mg total) by mouth daily. 03/01/15   Colon Branch, MD  FLUOCINOLONE ACETONIDE SCALP 0.01 % OIL  02/11/14   Historical Provider, MD  Linaclotide Rolan Lipa) 145 MCG CAPS capsule Take 1 capsule (145 mcg total) by mouth daily. 01/26/15   Jerene Bears, MD  metoCLOPramide (REGLAN) 5 MG tablet 1 tablet before meals and bedtime for 2 weeks 02/03/15   Jerene Bears, MD  mometasone (NASONEX) 50 MCG/ACT nasal spray 2 sprays each nostril q day 09/07/14   Mackie Pai, PA-C  Multiple Vitamin (MULTIVITAMIN) LIQD Take 5 mLs by mouth daily.    Historical Provider, MD  Naltrexone-Bupropion HCl ER 8-90 MG TB12 Take 2 tablets by mouth 2 (two) times daily. 02/23/15   Rosalita Chessman, DO  naproxen (NAPROSYN) 500 MG tablet Take 1 tablet (500 mg total) by mouth 2 (two) times daily. 03/19/15   Malvin Johns, MD  ondansetron (ZOFRAN ODT) 4 MG disintegrating tablet Take 1 tablet (4 mg total) by mouth every 8 (eight) hours as needed for nausea or vomiting. 01/26/15   Jerene Bears, MD  pantoprazole (PROTONIX) 40 MG tablet Take 1 tablet (40 mg total) by mouth daily. 01/26/15   Jerene Bears, MD  terconazole (TERAZOL 3) 0.8 % vaginal cream INSERT 1 APPLICATORFUL INTRAVAGINALLY AT BEDTIME NIGHTLY. 01/27/15   Historical Provider, MD  zonisamide (ZONEGRAN) 100 MG capsule Take 1 capsule (100 mg total) by mouth 2 (two) times daily. Patient taking differently: Take 100 mg by mouth 2 (two) times daily. Takes 3 tabs nightly 08/27/14   Rosalita Chessman, DO    Allergies  Acetaminophen; Hydrocodone; Oxycodone; Phosphate; and Codeine  Triage Vitals: BP 124/82 mmHg  Pulse 95   Temp(Src) 98.6 F (37 C) (Oral)  Resp 16  Ht 5\' 3"  (1.6 m)  Wt 170 lb (77.111 kg)  BMI 30.12 kg/m2  SpO2 100%  LMP 02/27/2015 (Approximate)  Physical Exam  Constitutional: She is oriented to person, place, and time. She appears well-developed and well-nourished.  HENT:  Head: Normocephalic and atraumatic.  Eyes: Pupils are equal, round, and reactive to light.  Neck: Normal range of motion. Neck supple.  Cardiovascular: Normal rate, regular rhythm and normal heart sounds.   Pulmonary/Chest: Effort normal and breath sounds normal. No respiratory distress. She has no wheezes. She has no rales. She exhibits no tenderness.  Abdominal: Soft. Bowel sounds are normal. There is no tenderness.  There is no rebound and no guarding.  Musculoskeletal: Normal range of motion. She exhibits no edema.  No edema or calf tenderness  Lymphadenopathy:    She has no cervical adenopathy.  Neurological: She is alert and oriented to person, place, and time.  Skin: Skin is warm and dry. No rash noted.  Psychiatric: She has a normal mood and affect.    ED Course  Procedures   DIAGNOSTIC STUDIES: Oxygen Saturation is 100% on Long, normal by my interpretation.    COORDINATION OF CARE: 9:48 PM Discussed treatment plan which includes CXR, lab work, and toradol with pt at bedside and pt agreed to plan.  Results for orders placed or performed during the hospital encounter of 68/11/57  Basic metabolic panel  Result Value Ref Range   Sodium 137 135 - 145 mmol/L   Potassium 3.7 3.5 - 5.1 mmol/L   Chloride 108 101 - 111 mmol/L   CO2 22 22 - 32 mmol/L   Glucose, Bld 82 65 - 99 mg/dL   BUN 16 6 - 20 mg/dL   Creatinine, Ser 0.90 0.44 - 1.00 mg/dL   Calcium 8.7 (L) 8.9 - 10.3 mg/dL   GFR calc non Af Amer >60 >60 mL/min   GFR calc Af Amer >60 >60 mL/min   Anion gap 7 5 - 15  CBC with Differential  Result Value Ref Range   WBC 11.4 (H) 4.0 - 10.5 K/uL   RBC 4.52 3.87 - 5.11 MIL/uL   Hemoglobin 13.1 12.0 -  15.0 g/dL   HCT 38.9 36.0 - 46.0 %   MCV 86.1 78.0 - 100.0 fL   MCH 29.0 26.0 - 34.0 pg   MCHC 33.7 30.0 - 36.0 g/dL   RDW 13.2 11.5 - 15.5 %   Platelets 377 150 - 400 K/uL   Neutrophils Relative % 71 %   Neutro Abs 8.1 (H) 1.7 - 7.7 K/uL   Lymphocytes Relative 21 %   Lymphs Abs 2.4 0.7 - 4.0 K/uL   Monocytes Relative 8 %   Monocytes Absolute 0.9 0.1 - 1.0 K/uL   Eosinophils Relative 0 %   Eosinophils Absolute 0.0 0.0 - 0.7 K/uL   Basophils Relative 0 %   Basophils Absolute 0.0 0.0 - 0.1 K/uL  D-dimer, quantitative  Result Value Ref Range   D-Dimer, Quant <0.27 0.00 - 0.48 ug/mL-FEU   Dg Chest 2 View  03/19/2015  CLINICAL DATA:  Left-sided chest pain radiating to the back. Productive cough. EXAM: CHEST  2 VIEW COMPARISON:  03/01/2015 FINDINGS: The heart size and mediastinal contours are within normal limits. Both lungs are clear. The visualized skeletal structures are unremarkable. IMPRESSION: No active cardiopulmonary disease. Electronically Signed   By: Kathreen Devoid   On: 03/19/2015 22:11   Dg Chest 2 View  03/01/2015  CLINICAL DATA:  Cough, chest congestion and discomfort, nonsmoker; history of allergic rhinitis and sinusitis. EXAM: CHEST  2 VIEW COMPARISON:  PA and lateral chest x-ray of July 10, 2012 FINDINGS: The lungs are mildly hyperinflated with hemidiaphragm flattening. The interstitial markings are coarse bilaterally and are more conspicuous than on the previous study. There is no alveolar infiltrate. There is no pleural effusion. The heart is normal in size. The pulmonary vascularity is not engorged. The mediastinum is normal in width. The bony thorax exhibits no acute abnormality. IMPRESSION: New mild interstitial prominence is consistent with acute bronchitis or pneumonitis superimposed upon underlying reactive airway disease. Electronically Signed   By: David  Martinique M.D.  On: 03/01/2015 10:34      Imaging Review Dg Chest 2 View  03/19/2015  CLINICAL DATA:   Left-sided chest pain radiating to the back. Productive cough. EXAM: CHEST  2 VIEW COMPARISON:  03/01/2015 FINDINGS: The heart size and mediastinal contours are within normal limits. Both lungs are clear. The visualized skeletal structures are unremarkable. IMPRESSION: No active cardiopulmonary disease. Electronically Signed   By: Kathreen Devoid   On: 03/19/2015 22:11    I personally reviewed and evaluated these images and lab results as a part of my medical decision-making.   EKG Interpretation  Date/Time:  Friday March 19 2015 21:48:22 EDT Ventricular Rate:  83 PR Interval:  132 QRS Duration: 82 QT Interval:  370 QTC Calculation: 434 R Axis:   59 Text Interpretation:  Normal sinus rhythm Normal ECG No old tracing to compare Confirmed by Armanii Urbanik  MD, Adriann Ballweg ([id number]) on 03/19/2015 10:49:59 PM        EKG Interpretation  Date/Time:  Friday March 19 2015 21:48:22 EDT Ventricular Rate:  83 PR Interval:  132 QRS Duration: 82 QT Interval:  370 QTC Calculation: 434 R Axis:   59 Text Interpretation:  Normal sinus rhythm Normal ECG No old tracing to compare Confirmed by Marcee Jacobs  MD, Dock Baccam ([id number]) on 03/19/2015 10:49:59 PM    MDM   Final diagnoses:  Pleurisy   Patient presents with pleuritic left-sided chest pain. She has no significant associated shortness of breath. No fevers. There is no evidence of pneumonia. No suggestions of pulmonary embolus. No pneumothorax. She does not look systemically ill. She has no hypoxia. She was discharged home in good condition with likely pleurisy. She was given a prescription for Naprosyn. She was encouraged to follow-up with her PCP on Monday if her symptoms are not improving or return here as needed for any worsening symptoms. She's recently completed 2 courses of antibiotics I don't feel this point that she needs further antibiotics.  I personally performed the services described in this documentation, which was scribed in my presence.  The  recorded information has been reviewed and considered.    Malvin Johns, MD 03/19/15 [phone]

## 2015-03-22 ENCOUNTER — Encounter: Payer: Self-pay | Admitting: Internal Medicine

## 2015-03-24 ENCOUNTER — Ambulatory Visit (INDEPENDENT_AMBULATORY_CARE_PROVIDER_SITE_OTHER): Payer: BLUE CROSS/BLUE SHIELD | Admitting: Internal Medicine

## 2015-03-24 ENCOUNTER — Encounter: Payer: Self-pay | Admitting: Internal Medicine

## 2015-03-24 VITALS — BP 114/76 | HR 84 | Temp 98.7°F | Ht 62.0 in | Wt 189.2 lb

## 2015-03-24 DIAGNOSIS — Z09 Encounter for follow-up examination after completed treatment for conditions other than malignant neoplasm: Secondary | ICD-10-CM

## 2015-03-24 DIAGNOSIS — R079 Chest pain, unspecified: Secondary | ICD-10-CM | POA: Diagnosis not present

## 2015-03-24 NOTE — Patient Instructions (Signed)
Continue with naproxen as needed for pain.  Always take it with food because may cause gastritis and ulcers.  If you notice nausea, stomach pain, change in the color of stools --->  Stop the medicine and let us know  Call if not back to normal  in 2 weeks  Call anytime if worse  Get a flu shot once you feel  feel better

## 2015-03-24 NOTE — Assessment & Plan Note (Signed)
Chest pain: Was seen here with sinusitis, bronchitis, was treated with actually 2 rounds of antibiotics, she felt better in general but developed more intense left-sided chest pain. Workup at the ER negative, diagnosed with pleurisy. Agreed w/ clinical dx , no evidence of pneumonia or   PE on clinical  grounds. She is already feeling better, recommend to continue naproxen, GI precautions discussed, call if no better. We'll have to consider further eval including a CT if symptoms persist.

## 2015-03-24 NOTE — Progress Notes (Signed)
Pre visit review using our clinic review tool, if applicable. No additional management support is needed unless otherwise documented below in the visit note. 

## 2015-03-24 NOTE — Progress Notes (Signed)
Subjective:    Patient ID: Morgan Long, female    DOB: [birth year], 44 y.o.   MRN: [id number]  DOS:  03/24/2015 Type of visit - description : ED follow-up Interval history: After she was seen here with sinusitis bronchitis, she improved to some extent but then developed a more intense left sided chest pain associated with deep breathing. The pain was behind the left breast and the left upper back. Did not change with food, it was not constant; no rash. Went to the ER, repeat a chest x-ray negative, d-dimer negative, CBC show white count of 11.9. Diagnosed with pleurisy, prescribed naproxen and at this point she feels the pain has decreased to some extent but is not gone.  Review of Systems Currently no fever, occasional chills. No actual shortness of breath she is just afraid to take deep breaths due to the pain. Occasional nausea, no vomiting, continue with constipation which is a chronic problem. No leg pain, swelling. No recent airplane trip or prolonged car trip  Past Medical History  Diagnosis Date  . Endometriosis   . Headache(784.0)     occasional, dx w/ Migraines before, Topamax helps  . TMJ pain dysfunction syndrome     occasional  . Allergic rhinitis   . Sinusitis   . GERD (gastroesophageal reflux disease)   . Chronic migraine     Dr. Catalina Gravel    Past Surgical History  Procedure Laterality Date  . Fallopian tube removed      Left  . Breast enhancement surgery  2006  . Endrometroisis      Social History   Social History  . Marital Status: Single    Spouse Name: N/A  . Number of Children: 0  . Years of Education: BS   Occupational History  . 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 Smoking status: Never Smoker   . Smokeless tobacco: Never Used  . Alcohol Use: 0.0 oz/week    0 Standard drinks or equivalent per week     Comment: socially   . Drug Use: No  . Sexual Activity: No   Other  Topics Concern  . Not on file   Social History Narrative   Household:sister and her 3 kids               Medication List       This list is accurate as of: 03/24/15  6:07 PM.  Always use your most recent med list.               azelastine 0.1 % nasal spray  Commonly known as:  ASTELIN  Place 2 sprays into both nostrils daily as needed for rhinitis. Use in each nostril as directed     clindamycin 1 % external solution  Commonly known as:  CLEOCIN T     cyclobenzaprine 10 MG tablet  Commonly known as:  FLEXERIL  TAKE 1 TABLET BY MOUTH AT BEDTIME AS NEEDED FOR HEADACHE & TMJ     DRYSOL 20 % external solution  Generic drug:  aluminum chloride     estradiol 1.53 MG/SPRAY transdermal spray  Commonly known as:  EVAMIST  Place 1 spray onto the skin daily.     etonogestrel-ethinyl estradiol 0.12-0.015 MG/24HR vaginal ring  Commonly known as:  Palmer 1 each vaginally every 28 (twenty-eight) days. Insert vaginally and leave in place for 3 consecutive weeks, then remove for 1  week.     fluconazole 150 MG tablet  Commonly known as:  DIFLUCAN  Take 1 tablet (150 mg total) by mouth daily.     FLUOCINOLONE ACETONIDE SCALP 0.01 % Oil     Linaclotide 145 MCG Caps capsule  Commonly known as:  LINZESS  Take 1 capsule (145 mcg total) by mouth daily.     metoCLOPramide 5 MG tablet  Commonly known as:  REGLAN  1 tablet before meals and bedtime for 2 weeks     mometasone 50 MCG/ACT nasal spray  Commonly known as:  NASONEX  2 sprays each nostril q day     MONODOX 100 MG capsule  Generic drug:  doxycycline  Take 100 mg by mouth 2 (two) times daily.     multivitamin Liqd  Take 5 mLs by mouth daily.     Naltrexone-Bupropion HCl ER 8-90 MG Tb12  Take 2 tablets by mouth 2 (two) times daily.     naproxen 500 MG tablet  Commonly known as:  NAPROSYN  Take 1 tablet (500 mg total) by mouth 2 (two) times daily.     ondansetron 4 MG disintegrating tablet  Commonly known  as:  ZOFRAN ODT  Take 1 tablet (4 mg total) by mouth every 8 (eight) hours as needed for nausea or vomiting.     pantoprazole 40 MG tablet  Commonly known as:  PROTONIX  Take 1 tablet (40 mg total) by mouth daily.     terconazole 0.8 % vaginal cream  Commonly known as:  TERAZOL 3  INSERT 1 APPLICATORFUL INTRAVAGINALLY AT BEDTIME NIGHTLY.     zonisamide 100 MG capsule  Commonly known as:  ZONEGRAN  Take 1 capsule (100 mg total) by mouth 2 (two) times daily.           Objective:   Physical Exam BP 114/76 mmHg  Pulse 84  Temp(Src) 98.7 F (37.1 C) (Oral)  Ht 5\' 2"  (1.575 m)  Wt 189 lb 4 oz (85.843 kg)  BMI 34.61 kg/m2  SpO2 96%  LMP 02/27/2015 (Approximate) General:   Well developed, well nourished . NAD.  HEENT:  Normocephalic . Face symmetric, atraumatic Lungs:  CTA B Normal respiratory effort, no intercostal retractions, no accessory muscle use. Chest wall: Slightly TTP anteriorly distal from the left breast and at the left upper back. Heart: RRR,  no murmur.  no pretibial edema bilaterally  Abdomen:  Not distended, soft, non-tender.  Skin: Not pale. Not jaundice Neurologic:  alert & oriented X3.  Speech normal, gait appropriate for age and unassisted Psych--  Cognition and judgment appear intact.  Cooperative with normal attention span and concentration.  Behavior appropriate. No anxious or depressed appearing.    Assessment & Plan:   Assessment > GERD Chronic migrainous Dr. Catalina Gravel, + help with Topamax Endometriosis Allergic rhinitis  Plan Chest pain: Was seen here with sinusitis, bronchitis, was treated with actually 2 rounds of antibiotics, she felt better in general but developed more intense left-sided chest pain. Workup at the ER negative, diagnosed with pleurisy. Agreed w/ clinical dx , no evidence of pneumonia or   PE on clinical  grounds. She is already feeling better, recommend to continue naproxen, GI precautions discussed, call if no better.  We'll have to consider further eval including a CT if symptoms persist. Multiple questions answered to the best of my ability

## 2015-03-25 ENCOUNTER — Telehealth: Payer: Self-pay | Admitting: Internal Medicine

## 2015-03-25 ENCOUNTER — Other Ambulatory Visit: Payer: Self-pay | Admitting: Internal Medicine

## 2015-03-25 ENCOUNTER — Other Ambulatory Visit: Payer: Self-pay

## 2015-03-25 MED ORDER — METOCLOPRAMIDE HCL 5 MG PO TABS: 5.0000 mg | ORAL_TABLET | Freq: Three times a day (TID) | ORAL | Status: DC

## 2015-03-25 NOTE — Telephone Encounter (Signed)
Discussed with pt that the reglan was to be taken to help with the nausea and the stomach discomfort. Pt states she is taking protonix in the am for epigastric discomfort but she is still having some issues with this. Discussed with pt that she could try adding zantac 150mg  at night and see if this helped. Pt verbalized understanding.

## 2015-04-20 ENCOUNTER — Other Ambulatory Visit: Payer: Self-pay

## 2015-04-20 ENCOUNTER — Other Ambulatory Visit: Payer: Self-pay | Admitting: Obstetrics and Gynecology

## 2015-04-20 DIAGNOSIS — R921 Mammographic calcification found on diagnostic imaging of breast: Secondary | ICD-10-CM

## 2015-05-05 ENCOUNTER — Other Ambulatory Visit: Payer: Self-pay | Admitting: Internal Medicine

## 2015-05-27 ENCOUNTER — Ambulatory Visit
Admission: RE | Admit: 2015-05-27 | Discharge: 2015-05-27 | Disposition: A | Discharge status: Home / Self Care | Payer: BLUE CROSS/BLUE SHIELD | Source: Ambulatory Visit | Attending: Obstetrics and Gynecology | Admitting: Obstetrics and Gynecology

## 2015-05-27 DIAGNOSIS — R921 Mammographic calcification found on diagnostic imaging of breast: Secondary | ICD-10-CM

## 2015-06-03 ENCOUNTER — Ambulatory Visit: Payer: Self-pay | Admitting: Family Medicine

## 2015-06-03 ENCOUNTER — Encounter: Payer: Self-pay | Admitting: Internal Medicine

## 2015-06-03 ENCOUNTER — Ambulatory Visit (INDEPENDENT_AMBULATORY_CARE_PROVIDER_SITE_OTHER): Payer: BLUE CROSS/BLUE SHIELD | Admitting: Internal Medicine

## 2015-06-03 VITALS — BP 110/68 | HR 92 | Temp 98.2°F | Ht 62.0 in | Wt 191.2 lb

## 2015-06-03 DIAGNOSIS — J069 Acute upper respiratory infection, unspecified: Secondary | ICD-10-CM | POA: Diagnosis not present

## 2015-06-03 DIAGNOSIS — J029 Acute pharyngitis, unspecified: Secondary | ICD-10-CM | POA: Diagnosis not present

## 2015-06-03 DIAGNOSIS — Z09 Encounter for follow-up examination after completed treatment for conditions other than malignant neoplasm: Secondary | ICD-10-CM

## 2015-06-03 LAB — POCT RAPID STREP A (OFFICE): Rapid Strep A Screen: NEGATIVE

## 2015-06-03 NOTE — Progress Notes (Signed)
Subjective:    Patient ID: Morgan Long, female    DOB: 07/04/1970, 44 y.o.   MRN: [id number]  DOS:  06/03/2015 Type of visit - description : Acute Interval history: She was doing well until a week ago when she started with sinus congestion, cough, mostly at night, sometimes having difficulty sleeping. Anterior chest pain only with cough, mild. + Sputum production, yellow in color. Developed a sore throat and like a strep test to be checked.    Review of Systems  Denies chills but had a temperature of 101. Denies sneezing, + itchy eyes, no watery eyes. No GERD symptoms Some nausea no vomiting or diarrhea. Mild chest congestion, no wheezing.  Past Medical History  Diagnosis Date  . Endometriosis   . Headache(784.0)     occasional, dx w/ Migraines before, Topamax helps  . TMJ pain dysfunction syndrome     occasional  . Allergic rhinitis   . Sinusitis   . GERD (gastroesophageal reflux disease)   . Chronic migraine     Dr. Catalina Gravel  . Dry eye syndrome of both lacrimal glands   . Nuclear cataract of both eyes     Mild    Past Surgical History  Procedure Laterality Date  . Fallopian tube removed      Left  . Breast enhancement surgery  2006  . Endrometroisis      Social History   Social History  . Marital Status: Single    Spouse Name: N/A  . Number of Children: 0  . Years of Education: BS   Occupational History  . 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 Smoking status: Never Smoker   . Smokeless tobacco: Never Used  . Alcohol Use: 0.0 oz/week    0 Standard drinks or equivalent per week     Comment: socially   . Drug Use: No  . Sexual Activity: No   Other Topics Concern  . Not on file   Social History Narrative   Household:sister and her 3 kids               Medication List       This list is accurate as of: 06/03/15  4:19 PM.  Always use your most recent med list.                 azelastine 0.1 % nasal spray  Commonly known as:  ASTELIN  Place 2 sprays into both nostrils daily as needed for rhinitis. Reported on 06/03/2015     baclofen 10 MG tablet  Commonly known as:  LIORESAL  Take 1 tablet by mouth as needed.     clindamycin 1 % external solution  Commonly known as:  CLEOCIN T     DRYSOL 20 % external solution  Generic drug:  aluminum chloride  Reported on 06/03/2015     estradiol 1.53 MG/SPRAY transdermal spray  Commonly known as:  EVAMIST  Place 1 spray onto the skin daily.     ESTROGEL 0.75 MG/1.25 GM (0.06%) topical gel  Generic drug:  Estradiol  Apply 1 application topically See admin instructions.     etonogestrel-ethinyl estradiol 0.12-0.015 MG/24HR vaginal ring  Commonly known as:  Dickens 1 each vaginally every 28 (twenty-eight) days. Insert vaginally and leave in place for 3 consecutive weeks, then remove for 1 week.     FLUOCINOLONE ACETONIDE SCALP 0.01 % Oil  fluocinonide ointment 0.05 %  Commonly known as:  LIDEX  Apply 1 application topically as needed.     LINZESS 145 MCG Caps capsule  Generic drug:  Linaclotide  TAKE 1 CAPSULE (145 MCG TOTAL) BY MOUTH DAILY.     metoCLOPramide 5 MG tablet  Commonly known as:  REGLAN  Take 1 tablet (5 mg total) by mouth 4 (four) times daily -  before meals and at bedtime.     mometasone 50 MCG/ACT nasal spray  Commonly known as:  NASONEX  2 sprays each nostril q day     multivitamin Liqd  Take 5 mLs by mouth daily.     Naltrexone-Bupropion HCl ER 8-90 MG Tb12  Take 2 tablets by mouth 2 (two) times daily.     naproxen 500 MG tablet  Commonly known as:  NAPROSYN  Take 1 tablet (500 mg total) by mouth 2 (two) times daily.     olopatadine 0.1 % ophthalmic solution  Commonly known as:  PATANOL  Place 1 drop into both eyes as needed.     ondansetron 4 MG disintegrating tablet  Commonly known as:  ZOFRAN ODT  Take 1 tablet (4 mg total) by mouth every 8 (eight) hours as  needed for nausea or vomiting.     pantoprazole 40 MG tablet  Commonly known as:  PROTONIX  Take 1 tablet (40 mg total) by mouth daily.     spironolactone 50 MG tablet  Commonly known as:  ALDACTONE  Take 1 tablet by mouth as needed.     SUMAtriptan 100 MG tablet  Commonly known as:  IMITREX  Take 1 tablet by mouth as needed.     terconazole 0.8 % vaginal cream  Commonly known as:  TERAZOL 3  INSERT 1 APPLICATORFUL INTRAVAGINALLY AT BEDTIME NIGHTLY.     zonisamide 100 MG capsule  Commonly known as:  ZONEGRAN  Take 1 capsule (100 mg total) by mouth 2 (two) times daily.           Objective:   Physical Exam BP 110/68 mmHg  Pulse 92  Temp(Src) 98.2 F (36.8 C) (Oral)  Ht 5\' 2"  (1.575 m)  Wt 191 lb 3.2 oz (86.728 kg)  BMI 34.96 kg/m2  SpO2 97% General:   Well developed, well nourished . NAD.  HEENT:  Normocephalic . Face symmetric, atraumatic. TM: wnl. Nose: slt congested. Throat- no red/symmetric Lungs:  CTA B Normal respiratory effort, no intercostal retractions, no accessory muscle use. Heart: RRR,  no murmur.  No pretibial edema bilaterally  Skin: Not pale. Not jaundice Neurologic:  alert & oriented X3.  Speech normal, gait appropriate for age and unassisted Psych--  Cognition and judgment appear intact.  Cooperative with normal attention span and concentration.  Behavior appropriate. No anxious or depressed appearing.      Assessment & Plan:   Assessment > GERD Chronic migrainous Dr. Catalina Gravel, + help with Topamax Endometriosis Allergic rhinitis  PLAN URI: Strep test negative. Rx conservative treatment, see instructions.

## 2015-06-03 NOTE — Patient Instructions (Addendum)
Rest, fluids , Motrin   If cough: Take Mucinex DM twice a day as needed until better  For nasal congestion Use OTC  Flonase : 2 nasal sprays on each side of the nose daily until you feel better Also use Astelin every night  Call if not gradually better over the next  10 days  Call anytime if the symptoms are severe

## 2015-06-03 NOTE — Progress Notes (Signed)
Pre visit review using our clinic review tool, if applicable. No additional management support is needed unless otherwise documented below in the visit note. 

## 2015-06-03 NOTE — Assessment & Plan Note (Signed)
URI: Strep test negative. Rx conservative treatment, see instructions.

## 2015-06-04 ENCOUNTER — Ambulatory Visit: Payer: BLUE CROSS/BLUE SHIELD | Admitting: Internal Medicine

## 2015-06-09 ENCOUNTER — Encounter: Payer: Self-pay | Admitting: Neurology

## 2015-06-09 ENCOUNTER — Ambulatory Visit (INDEPENDENT_AMBULATORY_CARE_PROVIDER_SITE_OTHER): Payer: BLUE CROSS/BLUE SHIELD | Admitting: Neurology

## 2015-06-09 VITALS — BP 118/80 | HR 82 | Resp 16 | Ht 62.0 in | Wt 191.0 lb

## 2015-06-09 DIAGNOSIS — R51 Headache: Secondary | ICD-10-CM

## 2015-06-09 DIAGNOSIS — R519 Headache, unspecified: Secondary | ICD-10-CM

## 2015-06-09 DIAGNOSIS — R635 Abnormal weight gain: Secondary | ICD-10-CM

## 2015-06-09 DIAGNOSIS — H5713 Ocular pain, bilateral: Secondary | ICD-10-CM | POA: Diagnosis not present

## 2015-06-09 DIAGNOSIS — H538 Other visual disturbances: Secondary | ICD-10-CM | POA: Diagnosis not present

## 2015-06-09 NOTE — Progress Notes (Signed)
Subjective:    Patient ID: Morgan Long is a 45 y.o. female.  HPI     Interim history:   Morgan Long is a 44 year old right-handed woman with an underlying medical history of allergic rhinitis, endometriosis, TMJ problems, and neck pain, as well as obesity, who is referred by her optometrist, Dr. Virgina Evener for new onset blurry vision both eyes, for the past 1 month. She sees Dr. Melton Alar for recurrent headaches. I reviewed the optometrist report from 04/27/2015. She reported intermittent headaches with worsening over the past 1 month. She reported blurry vision in both eyes and soreness with both eyes that felt tired all the time. She also had dry eyes. She felt light sensitive. I exam was benign. In particular, optic disc margins were distinct without pallor, spontaneous venous pulsations were noted in both eyes and no significant visual field defect was noted. She is referred for concern for underlying MS. I first met her on 05/22/2014 at the request of her neurosurgeon, Dr. Kathyrn Sheriff, at which time the patient reported intermittent right arm numbness, particularly with neck position changes. I suggested blood work and EMG and nerve conduction testing of the right upper extremity. Her blood work showed elevated B12 and B6 levels, indicative of B vitamin supplementation. Hemoglobin A1c was 5.7. We called her with her test results. She had EMG and nerve conduction testing on 06/02/2014: IMPRESSION:  Nerve conduction studies done on both upper extremities were unremarkable, without evidence of a neuropathy seen. EMG evaluation of the right upper extremity was unremarkable, without evidence of an overlying cervical radiculopathy. We called her with her test results. At the time, she reported improved symptoms.  Today, 06/09/2015: She reports a six-month history of feeling tired globally without actual one-sided weakness or numbness or tingling. In fact, symptoms from last year have completely  resolved. She has never had any one-sided blindness or visual field loss. She has been sensitive to light for as long as she can remember. She has recurrent migraines and is under close follow-up with Dr. Melton Alar. She is in the process of titrating Zonegran. She is currently on 225 mg each night. When she took 300 mg each night she felt too sleepy during the day. She does not have any snoring or apneic breathing pauses while asleep. She's not good sleeper but has never been a good sleeper she states. 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 She does not drink sodas. She does not drink alcohol or smoke. She has eye pain with eye movements and has had dry eyes for a long time. She has been given samples for eyedrops for dry eyes which have been helpful. Sounds like her insurance will not pay for that prescription however. She has no family history of multiple sclerosis or lupus. She denies any significant joint swelling or joint pain with the exception of bilateral knee pain, adding that she has gained about 50 pounds in the last year or so. She is currently helping her sister and her family out and stays with them.  Previously:  05/22/2014: She has intermittent right arm numbness. Her symptoms have been going on for about 6 months. She has not noted any permanent numbness and no issues elsewhere. She does not have any significant weakness and sometimes feels weak when the numbness seems to come on. It goes away if she changes positions or adjusts her neck position. She has  had right shoulder problems and pain in the right shoulder.  You saw her on 05/14/2014 for neck pain. She had undergone physical therapy without improvement of her neck pain. She had cervical epidural steroid injections which helped for about 24 hours as understand.  She had a C-spine MRI without contrast on 01/07/2014: Mild cervical spondylosis as  described above without significant disc protrusion, foraminal stenosis or central canal stenosis.   Blood work from 05/14/2014 was reviewed: She had a BMP, CBC with differential, liver function panel, lipid panel and TSH all of which were fine with the exception of a borderline LDL of 111.   Her Past Medical History Is Significant For: Past Medical History  Diagnosis Date  . Endometriosis   . Headache(784.0)     occasional, dx w/ Migraines before, Topamax helps  . TMJ pain dysfunction syndrome     occasional  . Allergic rhinitis   . Sinusitis   . GERD (gastroesophageal reflux disease)   . Chronic migraine     Dr. Catalina Gravel  . Dry eye syndrome of both lacrimal glands   . Nuclear cataract of both eyes     Mild    Her Past Surgical History Is Significant For: Past Surgical History  Procedure Laterality Date  . Fallopian tube removed      Left  . Breast enhancement surgery  2006  . Endrometroisis      Her Family History Is Significant For: Family History  Problem Relation Age of Onset  . Diabetes      grandmother  . Hypertension Maternal Aunt     several family members  . Throat cancer Maternal Uncle   . Colon cancer Neg Hx   . Heart attack Neg Hx   . Throat cancer Mother   . Heart disease Father   . Breast cancer Maternal Aunt     breast  . Breast cancer Maternal Aunt     breast  . Pancreatic cancer Maternal Aunt   . Asthma      cousin, maternal  . Breast cancer Maternal Aunt     total of 5 aunts  . Breast cancer Maternal Aunt     Her Social History Is Significant For: Social History   Social History  . Marital Status: Single    Spouse Name: N/A  . Number of Children: 0  . Years of Education: BS   Occupational History  . 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 Smoking status: Never Smoker   . Smokeless tobacco: Never Used  . Alcohol Use: 0.0 oz/week    0 Standard drinks or equivalent per  week     Comment: socially   . Drug Use: No  . Sexual Activity: No   Other Topics Concern  . None   Social History Narrative   Household:sister and her 3 kids    Drinks occasional [employer] drink           Her Allergies Are:  Allergies  Allergen Reactions  . Acetaminophen Itching  . Hydrocodone Nausea Only  . Oxycodone Itching  . Phosphate Itching    sick  . Codeine Itching and Nausea Only  :   Her Current Medications Are:  Outpatient Encounter Prescriptions as of 06/09/2015  Medication Sig  . azelastine (ASTELIN) 0.1 % nasal spray Place 2 sprays into both nostrils daily as needed for rhinitis. Reported on 06/03/2015  . baclofen (LIORESAL) 10 MG tablet  Take 1 tablet by mouth as needed.  . clindamycin (CLEOCIN T) 1 % external solution   . DRYSOL 20 % external solution Reported on 06/03/2015  . ESTROGEL 0.75 MG/1.25 GM (0.06%) topical gel Apply 1 application topically See admin instructions.  Marland Kitchen etonogestrel-ethinyl estradiol (NUVARING) 0.12-0.015 MG/24HR vaginal ring Place 1 each vaginally every 28 (twenty-eight) days. Insert vaginally and leave in place for 3 consecutive weeks, then remove for 1 week.   Marland Kitchen FLUOCINOLONE ACETONIDE SCALP 0.01 % OIL   . fluocinonide ointment (LIDEX) 2.70 % Apply 1 application topically as needed.  Marland Kitchen LINZESS 145 MCG CAPS capsule TAKE 1 CAPSULE (145 MCG TOTAL) BY MOUTH DAILY.  Marland Kitchen metoCLOPramide (REGLAN) 5 MG tablet Take 1 tablet (5 mg total) by mouth 4 (four) times daily -  before meals and at bedtime.  . mometasone (NASONEX) 50 MCG/ACT nasal spray 2 sprays each nostril q day  . Multiple Vitamin (MULTIVITAMIN) LIQD Take 5 mLs by mouth daily.  Marland Kitchen olopatadine (PATANOL) 0.1 % ophthalmic solution Place 1 drop into both eyes as needed.  . ondansetron (ZOFRAN ODT) 4 MG disintegrating tablet Take 1 tablet (4 mg total) by mouth every 8 (eight) hours as needed for nausea or vomiting.  . pantoprazole (PROTONIX) 40 MG tablet Take 1 tablet (40 mg total) by mouth  daily.  Marland Kitchen spironolactone (ALDACTONE) 50 MG tablet Take 1 tablet by mouth as needed.  . SUMAtriptan (IMITREX) 100 MG tablet Take 1 tablet by mouth as needed.   Marland Kitchen terconazole (TERAZOL 3) 0.8 % vaginal cream INSERT 1 APPLICATORFUL INTRAVAGINALLY AT BEDTIME NIGHTLY.  Marland Kitchen zonisamide (ZONEGRAN) 100 MG capsule Take 1 capsule (100 mg total) by mouth 2 (two) times daily. (Patient taking differently: Take 100 mg by mouth 2 (two) times daily. Takes 3 tabs nightly)  . [DISCONTINUED] estradiol (EVAMIST) 1.53 MG/SPRAY transdermal spray Place 1 spray onto the skin daily.  . [DISCONTINUED] Naltrexone-Bupropion HCl ER 8-90 MG TB12 Take 2 tablets by mouth 2 (two) times daily.  . [DISCONTINUED] naproxen (NAPROSYN) 500 MG tablet Take 1 tablet (500 mg total) by mouth 2 (two) times daily.   No facility-administered encounter medications on file as of 06/09/2015.  :  Review of Systems:  Out of a complete 14 point review of systems, all are reviewed and negative with the exception of these symptoms as listed below:   Review of Systems  Constitutional: Positive for fatigue.       Patient reports that a new onset of fatigue started about 6-7 months ago.   HENT: Positive for rhinorrhea.   Respiratory: Positive for cough and wheezing.   Neurological: Positive for headaches.       Migraines, eye pain, and blurred vision started about 3-4 months ago.  Insomnia, daytime sleepiness.     Objective:  Neurologic Exam  Physical Exam Physical Examination:   Filed Vitals:   06/09/15 1012  BP: 118/80  Pulse: 82  Resp: 16   General Examination: The patient is a very pleasant 45 y.o. female in no acute distress. She appears well-developed and well-nourished and well groomed. She is somewhat sensitive to light.  HEENT: Normocephalic, atraumatic, pupils are equal, round and reactive to light and accommodation. Funduscopic exam is normal with sharp disc margins noted. Extraocular tracking is good without limitation to gaze  excursion or nystagmus noted. She has mildly dry appearing eyes. She has mild pain with eye motility in all directions, denying any double vision. Normal smooth pursuit is noted. Hearing is grossly intact. Face is symmetric  with normal facial animation and normal facial sensation. Speech is clear with no dysarthria noted. There is no hypophonia. There is no lip, neck/head, jaw or voice tremor. Neck is supple with full range of passive and active motion. There are no carotid bruits on auscultation. Oropharynx exam reveals: mild mouth dryness, good dental hygiene and mild airway crowding, due to redundant soft palate. Mallampati is class II.   Chest: Clear to auscultation without wheezing, rhonchi or crackles noted.  Heart: S1+S2+0, regular and normal without murmurs, rubs or gallops noted.   Abdomen: Soft, non-tender and non-distended with normal bowel sounds appreciated on auscultation.  Extremities: There is no pitting edema in the distal lower extremities bilaterally. Pedal pulses are intact.  Skin: Warm and dry without trophic changes noted. There are no varicose veins.  Musculoskeletal: exam reveals no obvious joint deformities, tenderness or joint swelling or erythema.   Neurologically:  Mental status: The patient is awake, alert and oriented in all 4 spheres. Her immediate and remote memory, attention, language skills and fund of knowledge are appropriate. There is no evidence of aphasia, agnosia, apraxia or anomia. Speech is clear with normal prosody and enunciation. Thought process is linear. Mood is normal and affect is normal.  Cranial nerves II - XII are as described above under HEENT exam. In addition: shoulder shrug is normal with equal shoulder height noted. Motor exam: Normal bulk, strength and tone is noted. There is no drift, tremor or rebound. Romberg is negative. Reflexes are 2+ throughout. Babinski: Toes are flexor bilaterally. Fine motor skills and coordination: intact with  normal finger taps, normal hand movements, normal rapid alternating patting, normal foot taps and normal foot agility.  Cerebellar testing: No dysmetria or intention tremor on finger to nose testing. Heel to shin is unremarkable bilaterally. There is no truncal or gait ataxia.  Sensory exam: intact to light touch, pinprick, vibration, temperature sense in the upper and lower extremities.  Gait, station and balance: She stands easily. No veering to one side is noted. No leaning to one side is noted. Posture is age-appropriate and stance is narrow based. Gait shows normal stride length and normal pace. No problems turning are noted. She turns en bloc. Tandem walk is unremarkable.   Assessment and Plan:   In summary, Morgan Long is a very pleasant 45 year old female with an underlying medical history of allergic rhinitis, endometriosis, TMJ problems, and neck pain, as well as obesity, who presents for a new problem of blurry vision and eye pain with eye movements of approximately 6+ months duration, also a complaint of feeling fatigued. She has had a full eye exam with benign findings. Her GYN has been involved closely in her care and has checked her thyroid function. She has also had her B12 checked. Thankfully, her physical and neurological exam are nonfocal. She is reassured in that regard. Her eye exam shows benign findings with the exception of dry eyes. She has eye drops for this. I suggested further workup from my end of things including brain MRI with and without contrast, visual evoked potentials and additional blood work to include inflammatory, and autoimmune labs. We will call her with her test results and I will see her back routinely for follow-up after all these tests are done. She is advised to follow-up with Dr. Melton Alar for her migraines as previously scheduled.  I answered all her questions today and she was in agreement.   Thank you very much for allowing me to participate in the  care of  this nice patient. If I can be of any further assistance to you please do not hesitate to call me at [phone].  Sincerely,   Star Age, MD, PhD

## 2015-06-09 NOTE — Patient Instructions (Addendum)
Please remember, common headache triggers are: sleep deprivation, dehydration, overheating, stress, hypoglycemia or skipping meals and blood sugar fluctuations, excessive pain medications or excessive alcohol use or caffeine withdrawal. Some people have food triggers such as aged cheese, orange juice or chocolate, especially dark chocolate, or MSG (monosodium glutamate). Try to avoid these headache triggers as much possible. It may be helpful to keep a headache diary to figure out what makes your headaches worse or brings them on and what alleviates them. Some people report headache onset after exercise but studies have shown that regular exercise may actually prevent headaches from coming. If you have exercise-induced headaches, please make sure that you drink plenty of fluid before and after exercising and that you do not over do it and do not overheat.  Follow up with Dr. Melton Alar for migraines as planned, I will copy him on my note today as well.   Reassuringly, your exam and neuro exam are nonfocal. Nevertheless, let's do more workup: We will check blood work today and call you with the test results.  We will do a VEP, (brainwave test with eye stimulation), which we will schedule. We will call you with the results.  We will do a brain scan, called MRI and call you with the test results. We will have to schedule you for this on a separate date. This test requires authorization from your insurance, and we will take care of the insurance process.

## 2015-06-10 ENCOUNTER — Telehealth: Payer: Self-pay

## 2015-06-10 LAB — SEDIMENTATION RATE: Sed Rate: 7 mm/hr (ref 0–32)

## 2015-06-10 LAB — VITAMIN D 25 HYDROXY (VIT D DEFICIENCY, FRACTURES): VIT D 25 HYDROXY: 34.3 ng/mL (ref 30.0–100.0)

## 2015-06-10 LAB — HGB A1C W/O EAG: HEMOGLOBIN A1C: 5.6 % (ref 4.8–5.6)

## 2015-06-10 LAB — RHEUMATOID FACTOR: Rhuematoid fact SerPl-aCnc: <10 IU/mL (ref 0.0–13.9)

## 2015-06-10 LAB — ANA W/REFLEX: ANA: NEGATIVE

## 2015-06-10 LAB — C-REACTIVE PROTEIN: CRP: 15.6 mg/L — AB (ref 0.0–4.9)

## 2015-06-10 NOTE — Progress Notes (Signed)
Quick Note:  Please call patient, labs are normal, with the exception of elevated C-reactive protein which is a nonspecific indicator of inflammation or arthritis or infection. It could be something as simple as knee pain or osteoarthritis. Other autoimmune marker and inflammatory marker of sedimentation rate was normal actually which is reassuring. The only other thing was: vitamin D was on the lower end of the spectrum. It was still in the normal range but on the low end. It may not be a bad idea for her to start taking an over-the-counter vitamin D supplement, 1000-2000 units daily should be okay. She can have her vitamin D level rechecked in about 6 months by her primary care physician. Star Age, MD, PhD [facility] ([facility]  ______

## 2015-06-10 NOTE — Telephone Encounter (Signed)
-----   Message from Star Age, MD sent at 06/10/2015  1:42 PM EST ----- Please call patient, labs are normal, with the exception of elevated C-reactive protein which is a nonspecific indicator of inflammation or arthritis or infection. It could be something as simple as knee pain or osteoarthritis. Other autoimmune marker and inflammatory marker of sedimentation rate was normal actually which is reassuring. The only other thing was: vitamin D was on the lower end of the spectrum. It was still in the normal range but on the low end. It may not be a bad idea for her to start taking an over-the-counter vitamin D supplement, 1000-2000 units daily should be okay. She can have her vitamin D level rechecked in about 6 months by her primary care physician. Star Age, MD, PhD [facility])

## 2015-06-10 NOTE — Telephone Encounter (Signed)
I spoke to patient and she is aware of results and recommendations.  

## 2015-06-15 ENCOUNTER — Encounter: Payer: Self-pay | Admitting: Allergy and Immunology

## 2015-06-15 ENCOUNTER — Ambulatory Visit (INDEPENDENT_AMBULATORY_CARE_PROVIDER_SITE_OTHER): Payer: BLUE CROSS/BLUE SHIELD | Admitting: Allergy and Immunology

## 2015-06-15 VITALS — BP 112/82 | HR 80 | Resp 16

## 2015-06-15 DIAGNOSIS — J3089 Other allergic rhinitis: Secondary | ICD-10-CM | POA: Diagnosis not present

## 2015-06-15 DIAGNOSIS — J0100 Acute maxillary sinusitis, unspecified: Secondary | ICD-10-CM

## 2015-06-15 DIAGNOSIS — J45901 Unspecified asthma with (acute) exacerbation: Secondary | ICD-10-CM

## 2015-06-15 DIAGNOSIS — K219 Gastro-esophageal reflux disease without esophagitis: Secondary | ICD-10-CM | POA: Diagnosis not present

## 2015-06-15 DIAGNOSIS — J019 Acute sinusitis, unspecified: Secondary | ICD-10-CM | POA: Insufficient documentation

## 2015-06-15 MED ORDER — ALBUTEROL SULFATE HFA 108 (90 BASE) MCG/ACT IN AERS: INHALATION_SPRAY | RESPIRATORY_TRACT | Status: DC

## 2015-06-15 MED ORDER — BECLOMETHASONE DIPROPIONATE 80 MCG/ACT IN AERS: INHALATION_SPRAY | RESPIRATORY_TRACT | Status: DC

## 2015-06-15 MED ORDER — PREDNISONE 1 MG PO TABS: 10.0000 mg | ORAL_TABLET | ORAL | Status: DC

## 2015-06-15 NOTE — Assessment & Plan Note (Addendum)
   Prednisone has been provided, 20 mg x 4 days, 10 mg x1 day, then stop.  A prescription has been provided for Qvar (beclomethasone) 80 g, 2 inhalations via twice a day.  To maximize pulmonary deposition, a spacer has been provided along with instructions for its proper administration with an HFA inhaler.  A prescription has been provided for albuterol HFA, 1-2 inhalations via spacer device every 4-6 hours as needed and 15 minutes prior to exercise.  The patient has been asked to contact me if her symptoms persist or progress. Otherwise, she may return for follow up in 1 month.

## 2015-06-15 NOTE — Assessment & Plan Note (Signed)
   Continue appropriate lifestyle modifications and pantoprazole 40 mg daily prior to meal.

## 2015-06-15 NOTE — Patient Instructions (Addendum)
Asthma with acute exacerbation  Prednisone has been provided, 20 mg x 4 days, 10 mg x1 day, then stop.  A prescription has been provided for Qvar (beclomethasone) 80 g, 2 inhalations via twice a day.  To maximize pulmonary deposition, a spacer has been provided along with instructions for its proper administration with an HFA inhaler.  A prescription has been provided for albuterol HFA, 1-2 inhalations via spacer device every 4-6 hours as needed and 15 minutes prior to exercise.  The patient has been asked to contact me if her symptoms persist or progress. Otherwise, she may return for follow up in 1 month.  Acute sinusitis  Prednisone has been provided (as above).  I have encouraged nasal saline lavage 2 or 3 times daily followed by azelastine nasal spray and/or mometasone nasal spray.  We will prescribe an antibiotic if she becomes febrile or produces discolored mucus.  Allergic rhinitis  Continue azelastine nasal spray as needed, mometasone nasal spray as needed, and nasal saline lavage as needed.  GERD (gastroesophageal reflux disease)  Continue appropriate lifestyle modifications and pantoprazole 40 mg daily prior to meal.  Return in about 4 weeks (around 07/13/2015), or if symptoms worsen or fail to improve.

## 2015-06-15 NOTE — Assessment & Plan Note (Signed)
   Continue azelastine nasal spray as needed, mometasone nasal spray as needed, and nasal saline lavage as needed.

## 2015-06-15 NOTE — Progress Notes (Signed)
Follow-up Note  RE: Morgan Long MRN: [id number] DOB: 27-Oct-1970 Date of Office Visit: 06/15/2015  Primary care provider: Kathlene November, MD Referring provider: Colon Branch, MD  History of present illness: HPI Comments: Morgan Long is a 45 y.o. female with asthma and allergic rhinitis who presents today for sick visit.  She was most recently seen in this office in September 2014.  She reports that a few months ago she began to experience dyspnea and chest pain with inhalation.  She was initially given antibiotics without symptom relief so she proceeded to the emergency department approximately 10 weeks ago.  She states that pulmonary embolism was ruled out and she was diagnosed with pleurisy which was treated with anti-inflammatories.  She reports that the chest pain with inhalation has resolved, however she continues to experience dyspnea and chest tightness, particularly with mild/moderate exertion.  She also experiences lower respiratory symptoms with exposure to cigarette smoke from family members.  She also complains of sinus pressure, nasal congestion, and postnasal drainage over the past couple weeks.  She has not experienced fevers, chills, or discolored mucus production.   Assessment and plan: Asthma with acute exacerbation  Prednisone has been provided, 20 mg x 4 days, 10 mg x1 day, then stop.  A prescription has been provided for Qvar (beclomethasone) 80 g, 2 inhalations via twice a day.  To maximize pulmonary deposition, a spacer has been provided along with instructions for its proper administration with an HFA inhaler.  A prescription has been provided for albuterol HFA, 1-2 inhalations via spacer device every 4-6 hours as needed and 15 minutes prior to exercise.  The patient has been asked to contact me if her symptoms persist or progress. Otherwise, she may return for follow up in 1 month.  Acute sinusitis  Prednisone has been provided (as above).  I have encouraged  nasal saline lavage 2 or 3 times daily followed by azelastine nasal spray and/or mometasone nasal spray.  We will prescribe an antibiotic if she becomes febrile or produces discolored mucus.  Allergic rhinitis  Continue azelastine nasal spray as needed, mometasone nasal spray as needed, and nasal saline lavage as needed.  GERD (gastroesophageal reflux disease)  Continue appropriate lifestyle modifications and pantoprazole 40 mg daily prior to meal.   Meds ordered this encounter  Medications  . albuterol (PROAIR HFA) 108 (90 Base) MCG/ACT inhaler    Sig: INHALE TWO PUFFS EVERY 4-6 HOURS IF NEEDED FOR COUGH WHEEZE.    Dispense:  1 Inhaler    Refill:  1  . beclomethasone (QVAR) 80 MCG/ACT inhaler    Sig: INHALE TWO PUFFS TWICE DAILY TO PREVENT COUGH OR WHEEZE. RINSE MOUTH AFTER USE. USE WITH SPACER.    Dispense:  1 Inhaler    Refill:  5  . predniSONE (DELTASONE) tablet 10 mg    Sig:     Diagnositics: Spirometry reveals FVC of 2.42 L and an FEV1 of 2.31 L with significant (390 mL, 17%) postbronchodilator improvement.    Physical examination: Blood pressure 112/82, pulse 80, resp. rate 16.  General: Alert, interactive, in no acute distress. HEENT: TMs pearly gray, turbinates edematous without discharge, post-pharynx erythematous. Neck: Supple without lymphadenopathy. Lungs: Clear to auscultation without wheezing, rhonchi or rales. CV: Normal S1, S2 without murmurs. Skin: Warm and dry, without lesions or rashes.  The following portions of the patient's history were reviewed and updated as appropriate: allergies, current medications, past family history, past medical history, past social history, past surgical history  and problem list.    Medication List       This list is accurate as of: 06/15/15  6:51 PM.  Always use your most recent med list.               albuterol 108 (90 Base) MCG/ACT inhaler  Commonly known as:  PROAIR HFA  INHALE TWO PUFFS EVERY 4-6 HOURS IF  NEEDED FOR COUGH WHEEZE.     azelastine 0.1 % nasal spray  Commonly known as:  ASTELIN  Place 2 sprays into both nostrils daily as needed for rhinitis. Reported on 06/03/2015     baclofen 10 MG tablet  Commonly known as:  LIORESAL  Take 1 tablet by mouth as needed.     beclomethasone 80 MCG/ACT inhaler  Commonly known as:  QVAR  INHALE TWO PUFFS TWICE DAILY TO PREVENT COUGH OR WHEEZE. RINSE MOUTH AFTER USE. USE WITH SPACER.     clindamycin 1 % external solution  Commonly known as:  CLEOCIN T     DRYSOL 20 % external solution  Generic drug:  aluminum chloride  Reported on 06/03/2015     ESTROGEL 0.75 MG/1.25 GM (0.06%) topical gel  Generic drug:  Estradiol  Apply 1 application topically See admin instructions.     etonogestrel-ethinyl estradiol 0.12-0.015 MG/24HR vaginal ring  Commonly known as:  [place] 1 each vaginally every 28 (twenty-eight) days. Insert vaginally and leave in place for 3 consecutive weeks, then remove for 1 week.     FLUOCINOLONE ACETONIDE SCALP 0.01 % Oil     fluocinonide ointment 0.05 %  Commonly known as:  LIDEX  Apply 1 application topically as needed.     LINZESS 145 MCG Caps capsule  Generic drug:  Linaclotide  TAKE 1 CAPSULE (145 MCG TOTAL) BY MOUTH DAILY.     metoCLOPramide 5 MG tablet  Commonly known as:  REGLAN  Take 1 tablet (5 mg total) by mouth 4 (four) times daily -  before meals and at bedtime.     mometasone 50 MCG/ACT nasal spray  Commonly known as:  NASONEX  2 sprays each nostril q day     multivitamin Liqd  Take 5 mLs by mouth daily.     olopatadine 0.1 % ophthalmic solution  Commonly known as:  PATANOL  Place 1 drop into both eyes as needed.     ondansetron 4 MG disintegrating tablet  Commonly known as:  ZOFRAN ODT  Take 1 tablet (4 mg total) by mouth every 8 (eight) hours as needed for nausea or vomiting.     pantoprazole 40 MG tablet  Commonly known as:  PROTONIX  Take 1 tablet (40 mg total) by mouth daily.       spironolactone 50 MG tablet  Commonly known as:  ALDACTONE  Take 1 tablet by mouth as needed.     SUMAtriptan 100 MG tablet  Commonly known as:  IMITREX  Take 1 tablet by mouth as needed.     terconazole 0.8 % vaginal cream  Commonly known as:  TERAZOL 3  INSERT 1 APPLICATORFUL INTRAVAGINALLY AT BEDTIME NIGHTLY.     zonisamide 100 MG capsule  Commonly known as:  ZONEGRAN  Take 1 capsule (100 mg total) by mouth 2 (two) times daily.        Allergies  Allergen Reactions  . Acetaminophen Itching  . Hydrocodone Nausea Only  . Oxycodone Itching  . Phosphate Itching    sick  . Codeine Itching and Nausea Only  I appreciate the opportunity to take part in this Sima's care. Please do not hesitate to contact me with questions.  Sincerely,   R. Edgar Frisk, MD

## 2015-06-15 NOTE — Assessment & Plan Note (Addendum)
   Prednisone has been provided (as above).  I have encouraged nasal saline lavage 2 or 3 times daily followed by azelastine nasal spray and/or mometasone nasal spray.  We will prescribe an antibiotic if she becomes febrile or produces discolored mucus.

## 2015-06-16 ENCOUNTER — Telehealth: Payer: Self-pay | Admitting: Neurology

## 2015-06-16 NOTE — Telephone Encounter (Signed)
Ok for xanax pack from the the office?

## 2015-06-16 NOTE — Telephone Encounter (Signed)
I spoke to patient and she is aware of Xanax pack is ready at front desk for p/u.

## 2015-06-16 NOTE — Telephone Encounter (Signed)
Okay to provide Xanax from the office.

## 2015-06-16 NOTE — Telephone Encounter (Signed)
Pt called said she had MRI today but was unable to go finish it because she became claustrophobic. It had to be rescheduled. She is requesting medication to help her get thru the MRI r/s for 1/18 @ 10:15.

## 2015-06-23 ENCOUNTER — Ambulatory Visit (INDEPENDENT_AMBULATORY_CARE_PROVIDER_SITE_OTHER): Payer: Self-pay

## 2015-06-23 DIAGNOSIS — R51 Headache: Secondary | ICD-10-CM

## 2015-06-23 DIAGNOSIS — Z0289 Encounter for other administrative examinations: Secondary | ICD-10-CM

## 2015-06-23 DIAGNOSIS — R519 Headache, unspecified: Secondary | ICD-10-CM

## 2015-06-23 DIAGNOSIS — R635 Abnormal weight gain: Secondary | ICD-10-CM

## 2015-06-23 DIAGNOSIS — H5713 Ocular pain, bilateral: Secondary | ICD-10-CM

## 2015-06-23 DIAGNOSIS — H538 Other visual disturbances: Secondary | ICD-10-CM | POA: Diagnosis not present

## 2015-06-25 NOTE — Progress Notes (Signed)
Quick Note:  Please call and advise the patient that the recent scans we did was within normal limits. We did a brain MRI with and without contrast, which showed normal findings. In particular, there were no acute findings, such as a stroke, or mass or blood products. No further action is required on this test at this time. Please remind patient to keep any upcoming appointments or tests and to call us with any interim questions, concerns, problems or updates. Thanks,  Cathaleen Korol, MD, PhD   ______ 

## 2015-06-28 ENCOUNTER — Telehealth: Payer: Self-pay

## 2015-06-28 NOTE — Telephone Encounter (Signed)
I called but no answer and no vm.

## 2015-06-28 NOTE — Telephone Encounter (Signed)
-----   Message from Star Age, MD sent at 06/25/2015 12:37 PM EST ----- Please call and advise the patient that the recent scans we did was within normal limits. We did a brain MRI with and without contrast, which showed normal findings. In particular, there were no acute findings, such as a stroke, or mass or blood products. No further action is required on this test at this time. Please remind patient to keep any upcoming appointments or tests and to call us with any interim questions, concerns, problems or updates. Thanks,  Star Age, MD, PhD

## 2015-06-29 NOTE — Telephone Encounter (Signed)
I called again, no answer and vm full.

## 2015-06-30 NOTE — Telephone Encounter (Signed)
Called, no answer, vm full. I will send letter.

## 2015-07-04 ENCOUNTER — Other Ambulatory Visit: Payer: Self-pay | Admitting: Family Medicine

## 2015-07-05 NOTE — Telephone Encounter (Signed)
Last seen 01/12/15 and filled 01/14/15 #30   Please advise    KP

## 2015-07-06 ENCOUNTER — Ambulatory Visit (INDEPENDENT_AMBULATORY_CARE_PROVIDER_SITE_OTHER): Payer: BLUE CROSS/BLUE SHIELD | Admitting: Neurology

## 2015-07-06 DIAGNOSIS — R519 Headache, unspecified: Secondary | ICD-10-CM

## 2015-07-06 DIAGNOSIS — R635 Abnormal weight gain: Secondary | ICD-10-CM

## 2015-07-06 DIAGNOSIS — R51 Headache: Secondary | ICD-10-CM

## 2015-07-06 DIAGNOSIS — H538 Other visual disturbances: Secondary | ICD-10-CM | POA: Diagnosis not present

## 2015-07-06 DIAGNOSIS — H5713 Ocular pain, bilateral: Secondary | ICD-10-CM

## 2015-07-06 NOTE — Telephone Encounter (Signed)
Okay to refill Flexeril 

## 2015-07-06 NOTE — Procedures (Signed)
    History:   Morgan Long is a 45 year old patient with a one-month history of blurring of vision in both eyes. The patient is being evaluated for possible demyelinating disease.   Description: The visual evoked response test was performed today using 32 x 32 check sizes. The absolute latencies for the N1 and the P100 wave forms were within normal limits bilaterally. The amplitudes for the P100 wave forms were also within normal limits bilaterally. The visual acuity was 20/20 OD and 20/20 OS uncorrected.  Impression:  The visual evoked response test above was within normal limits bilaterally. No evidence of conduction slowing was seen within the anterior visual pathways on either side on today's evaluation.

## 2015-07-07 ENCOUNTER — Telehealth: Payer: Self-pay

## 2015-07-07 NOTE — Progress Notes (Signed)
Quick Note:  Pls call pt: VEP, visual evoked potential, which is a brain wave response to visual stimuli, was reported as normal. No further action required.  Star Age, MD, PhD [facility] ([facility])  ______

## 2015-07-07 NOTE — Telephone Encounter (Signed)
LM with results below 

## 2015-07-07 NOTE — Telephone Encounter (Signed)
-----   Message from Star Age, MD sent at 07/07/2015  7:39 AM EST ----- Pls call pt: VEP, visual evoked potential, which is a brain wave response to visual stimuli, was reported as normal. No further action required.  Star Age, MD, PhD [facility])

## 2015-07-12 ENCOUNTER — Other Ambulatory Visit: Payer: Self-pay | Admitting: Internal Medicine

## 2015-07-23 ENCOUNTER — Telehealth: Payer: Self-pay | Admitting: Internal Medicine

## 2015-07-23 NOTE — Telephone Encounter (Signed)
Pt scheduled mychart appt 2/20 with Dr. Etter Sjogren. Called pt since PCP is Dr. Larose Kells. Pt requesting change in primary care provider from Dr. Larose Kells to Dr. Etter Sjogren. Pt requested keeping appt Monday with Dr. Etter Sjogren as well. Please advise.

## 2015-07-23 NOTE — Telephone Encounter (Signed)
That is ok w/ me , thx

## 2015-07-23 NOTE — Telephone Encounter (Signed)
Fine with me

## 2015-07-26 ENCOUNTER — Encounter: Payer: Self-pay | Admitting: Family Medicine

## 2015-07-26 ENCOUNTER — Ambulatory Visit (INDEPENDENT_AMBULATORY_CARE_PROVIDER_SITE_OTHER): Payer: BLUE CROSS/BLUE SHIELD | Admitting: Family Medicine

## 2015-07-26 VITALS — BP 110/68 | HR 105 | Temp 99.0°F | Wt 190.6 lb

## 2015-07-26 DIAGNOSIS — R509 Fever, unspecified: Secondary | ICD-10-CM | POA: Diagnosis not present

## 2015-07-26 DIAGNOSIS — J09X2 Influenza due to identified novel influenza A virus with other respiratory manifestations: Secondary | ICD-10-CM

## 2015-07-26 LAB — POCT INFLUENZA A/B
INFLUENZA B, POC: NEGATIVE
Influenza A, POC: POSITIVE — AB

## 2015-07-26 MED ORDER — OSELTAMIVIR PHOSPHATE 75 MG PO CAPS: 75.0000 mg | ORAL_CAPSULE | Freq: Two times a day (BID) | ORAL | Status: DC

## 2015-07-26 MED FILL — OSELTAMIVIR PHOS 75 MG CAP: 75 | 5 days supply | Qty: 10 | Fill #0

## 2015-07-26 NOTE — Progress Notes (Signed)
Subjective:     Morgan Long is a 45 y.o. female who presents for evaluation of symptoms of a URI. Symptoms include achiness, congestion, fever 102, nasal congestion, post nasal drip, productive cough with  white colored sputum and sore throat. Onset of symptoms was 3 days ago, and has been gradually worsening since that time. Treatment to date: theraflu.  The following portions of the patient's history were reviewed and updated as appropriate:  She  has a past medical history of Endometriosis; Headache(784.0); TMJ pain dysfunction syndrome; Allergic rhinitis; Sinusitis; GERD (gastroesophageal reflux disease); Chronic migraine; Dry eye syndrome of both lacrimal glands; and Nuclear cataract of both eyes. She  does not have any pertinent problems on file. She  has past surgical history that includes fallopian tube removed; Breast enhancement surgery (2006); and endrometroisis. Her family history includes Breast cancer in her maternal aunt, maternal aunt, maternal aunt, and maternal aunt; Heart disease in her father; Hypertension in her maternal aunt; Pancreatic cancer in her maternal aunt; Throat cancer in her maternal uncle and mother. There is no history of Colon cancer or Heart attack. She  reports that she has never smoked. She has never used smokeless tobacco. She reports that she drinks alcohol. She reports that she does not use illicit drugs. She has a current medication list which includes the following prescription(s): albuterol, azelastine, baclofen, beclomethasone, clindamycin, cyclobenzaprine, drysol, estrogel, etonogestrel-ethinyl estradiol, fluocinolone acetonide scalp, fluocinonide ointment, linzess, metoclopramide, mometasone, multivitamin, olopatadine, pantoprazole, spironolactone, and terconazole, and the following Facility-Administered Medications: prednisone. Current Outpatient Prescriptions on File Prior to Visit  Medication Sig Dispense Refill  . albuterol (PROAIR HFA) 108 (90  Base) MCG/ACT inhaler INHALE TWO PUFFS EVERY 4-6 HOURS IF NEEDED FOR COUGH WHEEZE. 1 Inhaler 1  . azelastine (ASTELIN) 0.1 % nasal spray Place 2 sprays into both nostrils daily as needed for rhinitis. Reported on 06/03/2015    . baclofen (LIORESAL) 10 MG tablet Take 1 tablet by mouth as needed.  0  . beclomethasone (QVAR) 80 MCG/ACT inhaler INHALE TWO PUFFS TWICE DAILY TO PREVENT COUGH OR WHEEZE. RINSE MOUTH AFTER USE. USE WITH SPACER. 1 Inhaler 5  . clindamycin (CLEOCIN T) 1 % external solution     . cyclobenzaprine (FLEXERIL) 10 MG tablet Take 1 tablet (10 mg total) by mouth at bedtime as needed (headache and TMJ). 30 tablet 0  . DRYSOL 20 % external solution Reported on 06/03/2015    . ESTROGEL 0.75 MG/1.25 GM (0.06%) topical gel Apply 1 application topically See admin instructions.  0  . etonogestrel-ethinyl estradiol (NUVARING) 0.12-0.015 MG/24HR vaginal ring Place 1 each vaginally every 28 (twenty-eight) days. Insert vaginally and leave in place for 3 consecutive weeks, then remove for 1 week.     Marland Kitchen FLUOCINOLONE ACETONIDE SCALP 0.01 % OIL     . fluocinonide ointment (LIDEX) [id number] % Apply 1 application topically as needed.  3  . LINZESS 145 MCG CAPS capsule TAKE 1 CAPSULE (145 MCG TOTAL) BY MOUTH DAILY. 30 capsule 3  . metoCLOPramide (REGLAN) 5 MG tablet Take 1 tablet (5 mg total) by mouth 4 (four) times daily -  before meals and at bedtime. 120 tablet 0  . mometasone (NASONEX) 50 MCG/ACT nasal spray 2 sprays each nostril q day 17 g 0  . Multiple Vitamin (MULTIVITAMIN) LIQD Take 5 mLs by mouth daily.    Marland Kitchen olopatadine (PATANOL) 0.1 % ophthalmic solution Place 1 drop into both eyes as needed.  3  . pantoprazole (PROTONIX) 40 MG tablet TAKE 1  TABLET (40 MG TOTAL) BY MOUTH DAILY. 30 tablet 3  . spironolactone (ALDACTONE) 50 MG tablet Take 1 tablet by mouth as needed.  5  . terconazole (TERAZOL 3) 0.8 % vaginal cream INSERT 1 APPLICATORFUL INTRAVAGINALLY AT BEDTIME NIGHTLY.  3   Current  Facility-Administered Medications on File Prior to Visit  Medication Dose Route Frequency Provider Last Rate Last Dose  . predniSONE (DELTASONE) tablet 10 mg  10 mg Oral UD Adelina Mings, MD       She is allergic to acetaminophen; hydrocodone; oxycodone; phosphate; and codeine..  Review of Systems Pertinent items are noted in HPI.   Objective:    BP 110/68 mmHg  Pulse 105  Temp(Src) 99 F (37.2 C) (Oral)  Wt 190 lb 9.6 oz (86.456 kg)  SpO2 96%  LMP 06/20/2015 General appearance: alert, cooperative, appears stated age and mild distress Ears: normal TM's and external ear canals both ears Nose: Nares normal. Septum midline. Mucosa normal. No drainage or sinus tenderness. Throat: lips, mucosa, and tongue normal; teeth and gums normal Neck: no adenopathy, no carotid bruit, no JVD, supple, symmetrical, trachea midline and thyroid not enlarged, symmetric, no tenderness/mass/nodules Lungs: clear to auscultation bilaterally Heart: regular rate and rhythm, S1, S2 normal, no murmur, click, rub or gallop   Assessment:    influenza and viral upper respiratory illness   Plan:    Discussed diagnosis and treatment of URI. Suggested symptomatic OTC remedies. Nasal steroids per orders. Follow up as needed. tamiflu bid for 5 days

## 2015-07-26 NOTE — Progress Notes (Signed)
Pre visit review using our clinic review tool, if applicable. No additional management support is needed unless otherwise documented below in the visit note. 

## 2015-07-26 NOTE — Patient Instructions (Signed)

## 2015-07-30 ENCOUNTER — Other Ambulatory Visit: Payer: Self-pay | Admitting: Internal Medicine

## 2015-08-02 ENCOUNTER — Other Ambulatory Visit: Payer: Self-pay | Admitting: Internal Medicine

## 2015-08-02 NOTE — Telephone Encounter (Signed)
Last seen 07/08/15 and filled 07/06/15 #30  Please advise    KP

## 2015-08-18 ENCOUNTER — Other Ambulatory Visit: Payer: Self-pay | Admitting: Internal Medicine

## 2015-08-19 ENCOUNTER — Telehealth: Payer: Self-pay | Admitting: Internal Medicine

## 2015-08-19 NOTE — Telephone Encounter (Signed)
Dr Hilarie Fredrickson- Patient wants refills on Reglan. It appears she has been on this for a while and I know typically you like Reglan for short term rx if possible. Do you want me to continue patient on Reglan or give her something else?

## 2015-08-23 NOTE — Telephone Encounter (Signed)
Left message for patient to call back  

## 2015-08-23 NOTE — Telephone Encounter (Signed)
Needs OV for continuity Can refill until OV, use TIDACPRN

## 2015-08-24 MED ORDER — METOCLOPRAMIDE HCL 5 MG PO TABS: 5.0000 mg | ORAL_TABLET | Freq: Three times a day (TID) | ORAL | Status: DC

## 2015-08-24 NOTE — Telephone Encounter (Signed)
I have spoken to patient to advise of Dr Vena Rua recommendations. She verbalizes understanding and has an appointment scheduled for 10/26/15 @ 3 pm. She was under the impression that she was to take this long term. She states she called several months ago and was told that she should stay on this medication. I advised that this is occasionally used long term but is not typically preferred. She verbalizes understanding. Rx sent to pharmacy until appointment.

## 2015-09-07 ENCOUNTER — Encounter: Payer: Self-pay | Admitting: Neurology

## 2015-09-07 ENCOUNTER — Ambulatory Visit (INDEPENDENT_AMBULATORY_CARE_PROVIDER_SITE_OTHER): Payer: BLUE CROSS/BLUE SHIELD | Admitting: Neurology

## 2015-09-07 VITALS — BP 122/68 | HR 78 | Resp 16 | Ht 62.0 in | Wt 194.0 lb

## 2015-09-07 DIAGNOSIS — R5383 Other fatigue: Secondary | ICD-10-CM | POA: Diagnosis not present

## 2015-09-07 DIAGNOSIS — R519 Headache, unspecified: Secondary | ICD-10-CM

## 2015-09-07 DIAGNOSIS — H538 Other visual disturbances: Secondary | ICD-10-CM | POA: Diagnosis not present

## 2015-09-07 DIAGNOSIS — R51 Headache: Secondary | ICD-10-CM

## 2015-09-07 NOTE — Patient Instructions (Signed)
Your work up (blood work, MRI brain, visual evoked potentials) and exam are good! This is very reassuring.  Please have your eyes checked again for dry eyes.  From my end of things, I can see you back as needed.

## 2015-09-07 NOTE — Progress Notes (Signed)
Subjective:    Patient ID: Morgan Long is a 45 y.o. female.  HPI     Interim history:   Morgan Long is a 45 year old right-handed woman with an underlying medical history of allergic rhinitis, endometriosis, TMJ problems, and neck pain, as well as obesity, who presents for follow-up consultation of her blurry vision. The patient is unaccompanied today. I last saw her on 06/09/2015 at which time she was referred is a new patient referral for a new problem, referred by her optometrist at the time for 1 month history of blurry vision. Her exam at the time was nonfocal and reassuringly she had no significant eye related findings. I suggested further workup in the form of visual evoked potentials, blood work, and she also reported a 6 month history of feeling tired. She had no one-sided weakness, tingling or numbness. Symptoms from the past which included paresthesias had resolved completely. She was under the care of Dr. Melton Alar for migraines. She was in the process of titrating Zonegran. She was on 225 mg each night. When she took 300 mg each night she felt too sleepy during the day. She denied any symptoms of sleep disordered breathing. She did admit not being a good sleeper. She did endorse stress as she was in school online for psychology and was also working off-and-on in Personal assistant. She reported not drinking sodas, not drinking alcohol and she reported not smoking. She did report pain with eye movements and dry eyes.  She reported no family history of multiple sclerosis or lupus. She denied joint pain with the exception of bilateral knee pain and she also reported a 50 pound weight gain in the last year. She had visual evoked potentials on 07/06/2015: Impression:  The visual evoked response test above was within normal limits bilaterally. No evidence of conduction slowing was seen within the anterior visual pathways on either side on today's evaluation.  We called her with her test  results. Labs from 06/09/2015 showed normal A1c, normal vitamin D level, normal ANA, normal RF, normal ESR. CRP was elevated at 15.6. 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  She had a brain MRI with and without contrast on 06/23/2015: IMPRESSION:  This is a normal MRI of the brain with and without contrast  In addition, personally reviewed the images through the PACS system. We called her with her test results.  Today, 09/07/2015: She reports doing okay, still struggling with dry eyes and eye pain at times. She has not made an follow-up appointment with her optometrist yet. She has tried some eyedrops that did not help. She has been off of Zonegran and has been placed on nortriptyline by her headache specialist. Headaches are somewhat improved.  Previously:   I first met her on 05/22/2014 at the request of her neurosurgeon, Dr. Kathyrn Sheriff, at which time the patient reported intermittent right arm numbness, particularly with neck position changes. I suggested blood work and EMG and nerve conduction testing of the right upper extremity. Her blood work showed elevated B12 and B6 levels, indicative of B vitamin supplementation. Hemoglobin A1c was 5.7. We called her with her test results. She had EMG and nerve conduction testing on 06/02/2014: IMPRESSION:  Nerve conduction studies done on both upper extremities were unremarkable, without evidence of a neuropathy seen. EMG evaluation of the right upper extremity was  unremarkable, without evidence of an overlying cervical radiculopathy. We called her with her test results. At the time, she reported improved symptoms.  05/22/2014: She has intermittent right arm numbness. Her symptoms have been going on for about 6 months. She has not noted any permanent numbness and no issues elsewhere. She does  not have any significant weakness and sometimes feels weak when the numbness seems to come on. It goes away if she changes positions or adjusts her neck position. She has had right shoulder problems and pain in the right shoulder.  You saw her on 05/14/2014 for neck pain. She had undergone physical therapy without improvement of her neck pain. She had cervical epidural steroid injections which helped for about 24 hours as understand.  She had a C-spine MRI without contrast on 01/07/2014: Mild cervical spondylosis as described above without significant disc protrusion, foraminal stenosis or central canal stenosis.   Blood work from 05/14/2014 was reviewed: She had a BMP, CBC with differential, liver function panel, lipid panel and TSH all of which were fine with the exception of a borderline LDL of 111.   Her Past Medical History Is Significant For: Past Medical History  Diagnosis Date  . Endometriosis   . Headache(784.0)     occasional, dx w/ Migraines before, Topamax helps  . TMJ pain dysfunction syndrome     occasional  . Allergic rhinitis   . Sinusitis   . GERD (gastroesophageal reflux disease)   . Chronic migraine     Dr. Catalina Gravel  . Dry eye syndrome of both lacrimal glands   . Nuclear cataract of both eyes     Mild    Her Past Surgical History Is Significant For: Past Surgical History  Procedure Laterality Date  . Fallopian tube removed      Left  . Breast enhancement surgery  2006  . Endrometroisis      Her Family History Is Significant For: Family History  Problem Relation Age of Onset  . Diabetes      grandmother  . Hypertension Maternal Aunt     several family members  . Throat cancer Maternal Uncle   . Colon cancer Neg Hx   . Heart attack Neg Hx   . Throat cancer Mother   . Heart disease Father   . Breast cancer Maternal Aunt     breast  . Breast cancer Maternal Aunt     breast  . Pancreatic cancer Maternal Aunt   . Asthma      cousin, maternal  . Breast  cancer Maternal Aunt     total of 5 aunts  . Breast cancer Maternal Aunt     Her Social History Is Significant For: Social History   Social History  . Marital Status: Single    Spouse Name: N/A  . Number of Children: 0  . Years of Education: BS   Occupational History  . 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 Smoking status: Never Smoker   . Smokeless tobacco: Never Used  . Alcohol Use: 0.0 oz/week    0 Standard drinks or equivalent per week     Comment: socially   . Drug Use: No  . Sexual Activity: No   Other Topics Concern  . None   Social History Narrative   Household:sister and her 3 kids    Drinks occasional [employer]bucks drink  Her Allergies Are:  Allergies  Allergen Reactions  . Acetaminophen Itching  . Hydrocodone Nausea Only  . Oxycodone Itching  . Phosphate Itching    sick  . Codeine Itching and Nausea Only  :   Her Current Medications Are:  Outpatient Encounter Prescriptions as of 09/07/2015  Medication Sig  . albuterol (PROAIR HFA) 108 (90 Base) MCG/ACT inhaler INHALE TWO PUFFS EVERY 4-6 HOURS IF NEEDED FOR COUGH WHEEZE.  Marland Kitchen azelastine (ASTELIN) 0.1 % nasal spray Place 2 sprays into both nostrils daily as needed for rhinitis. Reported on 06/03/2015  . baclofen (LIORESAL) 10 MG tablet Take 1 tablet by mouth as needed.  . beclomethasone (QVAR) 80 MCG/ACT inhaler INHALE TWO PUFFS TWICE DAILY TO PREVENT COUGH OR WHEEZE. RINSE MOUTH AFTER USE. USE WITH SPACER.  . clindamycin (CLEOCIN T) 1 % external solution   . cyclobenzaprine (FLEXERIL) 10 MG tablet TAKE 1 TABLET (10 MG TOTAL) BY MOUTH AT BEDTIME AS NEEDED (HEADACHE AND TMJ).  . DRYSOL 20 % external solution Reported on 06/03/2015  . ESTROGEL 0.75 MG/1.25 GM (0.06%) topical gel Apply 1 application topically See admin instructions.  Marland Kitchen etonogestrel-ethinyl estradiol (NUVARING) 0.12-0.015 MG/24HR vaginal ring Place 1 each vaginally every  28 (twenty-eight) days. Insert vaginally and leave in place for 3 consecutive weeks, then remove for 1 week.   Marland Kitchen FLUOCINOLONE ACETONIDE SCALP 0.01 % OIL   . fluocinonide ointment (LIDEX) 4.40 % Apply 1 application topically as needed.  Marland Kitchen LINZESS 145 MCG CAPS capsule TAKE 1 CAPSULE (145 MCG TOTAL) BY MOUTH DAILY.  Marland Kitchen metoCLOPramide (REGLAN) 5 MG tablet Take 1 tablet (5 mg total) by mouth 3 (three) times daily before meals. As needed.  . mometasone (NASONEX) 50 MCG/ACT nasal spray 2 sprays each nostril q day  . Multiple Vitamin (MULTIVITAMIN) LIQD Take 5 mLs by mouth daily.  . nortriptyline (PAMELOR) 10 MG capsule Take 50 mg by mouth at bedtime.  . pantoprazole (PROTONIX) 40 MG tablet TAKE 1 TABLET (40 MG TOTAL) BY MOUTH DAILY.  Marland Kitchen spironolactone (ALDACTONE) 50 MG tablet Take 1 tablet by mouth as needed.  . [DISCONTINUED] olopatadine (PATANOL) 0.1 % ophthalmic solution Place 1 drop into both eyes as needed.  . [DISCONTINUED] oseltamivir (TAMIFLU) 75 MG capsule Take 1 capsule (75 mg total) by mouth 2 (two) times daily.  . [DISCONTINUED] terconazole (TERAZOL 3) 0.8 % vaginal cream INSERT 1 APPLICATORFUL INTRAVAGINALLY AT BEDTIME NIGHTLY.   Facility-Administered Encounter Medications as of 09/07/2015  Medication  . predniSONE (DELTASONE) tablet 10 mg  :  Review of Systems:  Out of a complete 14 point review of systems, all are reviewed and negative with the exception of these symptoms as listed below:   Review of Systems  Neurological:       Patient reports that she is still having some trouble with her eyes and is still having headaches. Headaches are less frequent than before.     Objective:  Neurologic Exam  Physical Exam Physical Examination:   Filed Vitals:   09/07/15 1206  BP: 122/68  Pulse: 78  Resp: 16   General Examination: The patient is a very pleasant 45 y.o. female in no acute distress. She appears well-developed and well-nourished and well groomed. She is somewhat  sensitive to light.  HEENT: Normocephalic, atraumatic, pupils are equal, round and reactive to light and accommodation. Funduscopic exam is normal with sharp disc margins noted. Extraocular tracking is good without limitation to gaze excursion or nystagmus noted. She has mildly dry appearing eyes. She has  mild pain with eye motility in all directions, denying any double vision, all unchanged. Normal smooth pursuit is noted. Hearing is grossly intact. Face is symmetric with normal facial animation and normal facial sensation. Speech is clear with no dysarthria noted. There is no hypophonia. There is no lip, neck/head, jaw or voice tremor. Neck is supple with full range of passive and active motion. There are no carotid bruits on auscultation. Oropharynx exam reveals: mild mouth dryness, good dental hygiene and mild airway crowding, due to redundant soft palate. Mallampati is class II.   Chest: Clear to auscultation without wheezing, rhonchi or crackles noted.  Heart: S1+S2+0, regular and normal without murmurs, rubs or gallops noted.   Abdomen: Soft, non-tender and non-distended with normal bowel sounds appreciated on auscultation.  Extremities: There is no pitting edema in the distal lower extremities bilaterally. Pedal pulses are intact.  Skin: Warm and dry without trophic changes noted. There are no varicose veins.  Musculoskeletal: exam reveals no obvious joint deformities, tenderness or joint swelling or erythema.   Neurologically:  Mental status: The patient is awake, alert and oriented in all 4 spheres. Her immediate and remote memory, attention, language skills and fund of knowledge are appropriate. There is no evidence of aphasia, agnosia, apraxia or anomia. Speech is clear with normal prosody and enunciation. Thought process is linear. Mood is normal and affect is normal.  Cranial nerves II - XII are as described above under HEENT exam. In addition: shoulder shrug is normal with equal  shoulder height noted. Motor exam: Normal bulk, strength and tone is noted. There is no drift, tremor or rebound. Romberg is negative. Reflexes are 2+ throughout. Fine motor skills and coordination: intact with normal finger taps, normal hand movements, normal rapid alternating patting, normal foot taps and normal foot agility.  Cerebellar testing: No dysmetria or intention tremor on finger to nose testing. Heel to shin is unremarkable bilaterally. There is no truncal or gait ataxia.  Sensory exam: intact to light touch, vibration, temperature sense in the upper and lower extremities.  Gait, station and balance: She stands easily. No veering to one side is noted. No leaning to one side is noted. Posture is age-appropriate and stance is narrow based. Gait shows normal stride length and normal pace. No problems turning are noted. She turns en bloc. Tandem walk is unremarkable.   Assessment and Plan:   In summary, Cristi A Pisani is a very pleasant 45 year old female with an underlying medical history of allergic rhinitis, endometriosis, TMJ problems, and neck pain, as well as obesity, who presents for Follow-up consultation of her recent problem with blurry vision. Workup from my end of things including blood work, brain MRI with and without contrast, visual evoked potentials were unremarkable. Exam has remained nonfocal. She is reassured in that regard. She is encouraged to follow-up with her eye doctor regarding her dry eyes. She continues to complain of fatigue. She is encouraged to follow-up with her primary care physician to make sure her thyroid function and B12 level are up-to-date and normal. From my end of things, we talked about her test results in detail today. Her exam continues to be nonfocal. We mutually agreed to have her follow-up with me on an as-needed basis. I answered all her questions today and she was in agreement.

## 2015-09-13 ENCOUNTER — Ambulatory Visit: Payer: BLUE CROSS/BLUE SHIELD | Admitting: Neurology

## 2015-09-18 ENCOUNTER — Encounter (HOSPITAL_COMMUNITY): Payer: Self-pay | Admitting: *Deleted

## 2015-09-18 ENCOUNTER — Ambulatory Visit (INDEPENDENT_AMBULATORY_CARE_PROVIDER_SITE_OTHER): Payer: BLUE CROSS/BLUE SHIELD

## 2015-09-18 ENCOUNTER — Ambulatory Visit (HOSPITAL_COMMUNITY)
Admission: EM | Admit: 2015-09-18 | Discharge: 2015-09-18 | Disposition: A | Discharge status: Home / Self Care | Payer: BLUE CROSS/BLUE SHIELD | Attending: Emergency Medicine | Admitting: Emergency Medicine

## 2015-09-18 DIAGNOSIS — M7989 Other specified soft tissue disorders: Secondary | ICD-10-CM | POA: Diagnosis not present

## 2015-09-18 DIAGNOSIS — S93402A Sprain of unspecified ligament of left ankle, initial encounter: Secondary | ICD-10-CM | POA: Diagnosis not present

## 2015-09-18 DIAGNOSIS — S93401A Sprain of unspecified ligament of right ankle, initial encounter: Secondary | ICD-10-CM

## 2015-09-18 DIAGNOSIS — S46811A Strain of other muscles, fascia and tendons at shoulder and upper arm level, right arm, initial encounter: Secondary | ICD-10-CM

## 2015-09-18 DIAGNOSIS — S99912A Unspecified injury of left ankle, initial encounter: Secondary | ICD-10-CM | POA: Diagnosis not present

## 2015-09-18 NOTE — ED Provider Notes (Signed)
CSN: [id number]     Arrival date & time 09/18/15  1411 History   First MD Initiated Contact with Patient 09/18/15 1610     Chief Complaint  Patient presents with  . Fall   (Consider location/radiation/quality/duration/timing/severity/associated sxs/prior Treatment) HPI History obtained from patient: Location: both ankles, neck Context/Duration: wearing high heels, slipped on oil of floor, causing her to twist both ankles with pain in the right neck. Pinching sensation posterior left thigh last night.  Severity:4  Quality: Timing:    constant        Home Treatment:  Associated symptoms:  Walking with limp, pain to move neck  Past Medical History  Diagnosis Date  . Endometriosis   . Headache(784.0)     occasional, dx w/ Migraines before, Topamax helps  . TMJ pain dysfunction syndrome     occasional  . Allergic rhinitis   . Sinusitis   . GERD (gastroesophageal reflux disease)   . Chronic migraine     Dr. Catalina Gravel  . Dry eye syndrome of both lacrimal glands   . Nuclear cataract of both eyes     Mild   Past Surgical History  Procedure Laterality Date  . Fallopian tube removed      Left  . Breast enhancement surgery  2006  . Endrometroisis     Family History  Problem Relation Age of Onset  . Diabetes      grandmother  . Hypertension Maternal Aunt     several family members  . Throat cancer Maternal Uncle   . Colon cancer Neg Hx   . Heart attack Neg Hx   . Throat cancer Mother   . Heart disease Father   . Breast cancer Maternal Aunt     breast  . Breast cancer Maternal Aunt     breast  . Pancreatic cancer Maternal Aunt   . Asthma      cousin, maternal  . Breast cancer Maternal Aunt     total of 5 aunts  . Breast cancer Maternal Aunt    Social History  Substance Use Topics  . Smoking status: Never Smoker   . Smokeless tobacco: Never Used  . Alcohol Use: 0.0 oz/week    0 Standard drinks or equivalent per week     Comment: socially    OB History    No data  available     Review of Systems Neck and both ankle pain Allergies  Acetaminophen; Hydrocodone; Oxycodone; Phosphate; and Codeine  Home Medications   Prior to Admission medications   Medication Sig Start Date End Date Taking? Authorizing Provider  albuterol (PROAIR HFA) 108 (90 Base) MCG/ACT inhaler INHALE TWO PUFFS EVERY 4-6 HOURS IF NEEDED FOR COUGH WHEEZE. 06/15/15   Adelina Mings, MD  azelastine (ASTELIN) 0.1 % nasal spray Place 2 sprays into both nostrils daily as needed for rhinitis. Reported on 06/03/2015    Historical Provider, MD  baclofen (LIORESAL) 10 MG tablet Take 1 tablet by mouth as needed. 06/02/15   Historical Provider, MD  beclomethasone (QVAR) 80 MCG/ACT inhaler INHALE TWO PUFFS TWICE DAILY TO PREVENT COUGH OR WHEEZE. RINSE MOUTH AFTER USE. USE WITH SPACER. 06/15/15   Adelina Mings, MD  clindamycin (CLEOCIN T) 1 % external solution  02/26/14   Historical Provider, MD  cyclobenzaprine (FLEXERIL) 10 MG tablet TAKE 1 TABLET (10 MG TOTAL) BY MOUTH AT BEDTIME AS NEEDED (HEADACHE AND TMJ). 08/02/15   Rosalita Chessman Chase, DO  DRYSOL 20 % external solution Reported on 06/03/2015  02/25/14   Historical Provider, MD  ESTROGEL 0.75 MG/1.25 GM (0.06%) topical gel Apply 1 application topically See admin instructions. 05/18/15   Historical Provider, MD  etonogestrel-ethinyl estradiol (NUVARING) 0.12-0.015 MG/24HR vaginal ring Place 1 each vaginally every 28 (twenty-eight) days. Insert vaginally and leave in place for 3 consecutive weeks, then remove for 1 week.     Historical Provider, MD  FLUOCINOLONE ACETONIDE SCALP 0.01 % OIL  02/11/14   Historical Provider, MD  fluocinonide ointment (LIDEX) [id number] % Apply 1 application topically as needed. 04/12/15   Historical Provider, MD  LINZESS 145 MCG CAPS capsule TAKE 1 CAPSULE (145 MCG TOTAL) BY MOUTH DAILY. 05/06/15   Jerene Bears, MD  metoCLOPramide (REGLAN) 5 MG tablet Take 1 tablet (5 mg total) by mouth 3 (three) times daily before  meals. As needed. 08/24/15   Jerene Bears, MD  mometasone (NASONEX) 50 MCG/ACT nasal spray 2 sprays each nostril q day 09/07/14   Mackie Pai, PA-C  Multiple Vitamin (MULTIVITAMIN) LIQD Take 5 mLs by mouth daily.    Historical Provider, MD  nortriptyline (PAMELOR) 10 MG capsule Take 50 mg by mouth at bedtime.    Historical Provider, MD  pantoprazole (PROTONIX) 40 MG tablet TAKE 1 TABLET (40 MG TOTAL) BY MOUTH DAILY. 07/12/15   Jerene Bears, MD  spironolactone (ALDACTONE) 50 MG tablet Take 1 tablet by mouth as needed. 05/08/15   Historical Provider, MD   Meds Ordered and Administered this Visit  Medications - No data to display  BP 132/83 mmHg  Pulse 90  Temp(Src) 98.6 F (37 C) (Oral)  SpO2 96%  LMP 08/24/2015 No data found.   Physical Exam NURSES NOTES AND VITAL SIGNS REVIEWED. CONSTITUTIONAL: Well developed, well nourished, no acute distress HEENT: normocephalic, atraumatic EYES: Conjunctiva normal NECK:normal ROM, supple, no adenopathy, tender right trapezius no midline tenderness.  PULMONARY:No respiratory distress, normal effort MUSCULOSKELETAL: Normal ROM of all extremities, Land right ankles minimal swelling. Tenderness lateral aspect of both ankle left>right SKIN: warm and dry without rash PSYCHIATRIC: Mood and affect, behavior are normal  ED Course  Procedures (including critical care time)  Labs Review Labs Reviewed - No data to display  Imaging Review Dg Ankle Complete Left  09/18/2015  CLINICAL DATA:  Left ankle pain after twisting injury 1 night prior EXAM: LEFT ANKLE COMPLETE - 3+ VIEW COMPARISON:  None. FINDINGS: Mild to moderate soft tissue swelling throughout the ankle, most prominent laterally. Partially visualized is a K-wire in the distal first metatarsal. No fracture, subluxation or suspicious focal osseous lesion in the left ankle. Small Achilles left calcaneal spur. IMPRESSION: Mild-to-moderate left ankle soft tissue swelling, most prominent laterally, with  no fracture or subluxation. Electronically Signed   By: Ilona Sorrel M.D.   On: 09/18/2015 17:02     Visual Acuity Review  Right Eye Distance:   Left Eye Distance:   Bilateral Distance:    Right Eye Near:   Left Eye Near:    Bilateral Near:      I HAVE REVIEWED AND DISCUSSED RESULTS OF THE X-RAYS ASO for left ankle support Ace wrap for right ankle.    MDM   1. Ankle sprain, left, initial encounter   2. Ankle sprain, right, initial encounter   3. Trapezius strain, right, initial encounter     Patient is reassured that there are no issues that require transfer to higher level of care at this time or additional tests. Patient is advised to continue home symptomatic treatment. Patient is  advised that if there are new or worsening symptoms to attend the emergency department, contact primary care provider, or return to UC. Instructions of care provided discharged home in stable condition.    THIS NOTE WAS GENERATED USING A VOICE RECOGNITION SOFTWARE PROGRAM. ALL REASONABLE EFFORTS  WERE MADE TO PROOFREAD THIS DOCUMENT FOR ACCURACY.  I have verbally reviewed the discharge instructions with the patient. A printed AVS was given to the patient.  All questions were answered prior to discharge.      Konrad Felix, [place] 09/18/15 1725

## 2015-09-18 NOTE — ED Notes (Signed)
aso  Large  l  Ankle

## 2015-09-18 NOTE — Discharge Instructions (Signed)
Ankle Sprain Review of your ankle xray does not reveal a fracture Treat symptomatically with Rest, Ice, Compression and Elevation Activity as tolerated.  Wear shoes that support your ankles.  An ankle sprain is an injury to the strong, fibrous tissues (ligaments) that hold the bones of your ankle joint together.  CAUSES An ankle sprain is usually caused by a fall or by twisting your ankle. Ankle sprains most commonly occur when you step on the outer edge of your foot, and your ankle turns inward. People who participate in sports are more prone to these types of injuries.  SYMPTOMS   Pain in your ankle. The pain may be present at rest or only when you are trying to stand or walk.  Swelling.  Bruising. Bruising may develop immediately or within 1 to 2 days after your injury.  Difficulty standing or walking, particularly when turning corners or changing directions. DIAGNOSIS  Your caregiver will ask you details about your injury and perform a physical exam of your ankle to determine if you have an ankle sprain. During the physical exam, your caregiver will press on and apply pressure to specific areas of your foot and ankle. Your caregiver will try to move your ankle in certain ways. An X-ray exam may be done to be sure a bone was not broken or a ligament did not separate from one of the bones in your ankle (avulsion fracture).  TREATMENT  Certain types of braces can help stabilize your ankle. Your caregiver can make a recommendation for this. Your caregiver may recommend the use of medicine for pain. If your sprain is severe, your caregiver may refer you to a surgeon who helps to restore function to parts of your skeletal system (orthopedist) or a physical therapist. [place] ice to your injury for 1-2 days or as directed by your caregiver. Applying ice helps to reduce inflammation and pain.  Put ice in a plastic bag.  Place a towel between your skin and the  bag.  Leave the ice on for 15-20 minutes at a time, every 2 hours while you are awake.  Only take over-the-counter or prescription medicines for pain, discomfort, or fever as directed by your caregiver.  Elevate your injured ankle above the level of your heart as much as possible for 2-3 days.  If your caregiver recommends crutches, use them as instructed. Gradually put weight on the affected ankle. Continue to use crutches or a cane until you can walk without feeling pain in your ankle.  If you have a plaster splint, wear the splint as directed by your caregiver. Do not rest it on anything harder than a pillow for the first 24 hours. Do not put weight on it. Do not get it wet. You may take it off to take a shower or bath.  You may have been given an elastic bandage to wear around your ankle to provide support. If the elastic bandage is too tight (you have numbness or tingling in your foot or your foot becomes cold and blue), adjust the bandage to make it comfortable.  If you have an air splint, you may blow more air into it or let air out to make it more comfortable. You may take your splint off at night and before taking a shower or bath. Wiggle your toes in the splint several times per day to decrease swelling. SEEK MEDICAL CARE IF:   You have rapidly increasing bruising or swelling.  Your toes feel  extremely cold or you lose feeling in your foot.  Your pain is not relieved with medicine. SEEK IMMEDIATE MEDICAL CARE IF:  Your toes are numb or blue.  You have severe pain that is increasing. MAKE SURE YOU:   Understand these instructions.  Will watch your condition.  Will get help right away if you are not doing well or get worse.   This information is not intended to replace advice given to you by your health care provider. Make sure you discuss any questions you have with your health care provider.   Document Released: 05/22/2005 Document Revised: 06/12/2014 Document Reviewed:  06/03/2011 Elsevier Interactive Patient Education 2016 Elsevier [employer] Cervical Sprain A cervical sprain is an injury in the neck in which the strong, fibrous tissues (ligaments) that connect your neck bones stretch or tear. Cervical sprains can range from mild to severe. Severe cervical sprains can cause the neck vertebrae to be unstable. This can lead to damage of the spinal cord and can result in serious nervous system problems. The amount of time it takes for a cervical sprain to get better depends on the cause and extent of the injury. Most cervical sprains heal in 1 to 3 weeks. CAUSES  Severe cervical sprains may be caused by:  Contact sport injuries (such as from football, rugby, wrestling, hockey, auto racing, gymnastics, diving, martial arts, or boxing).  Motor vehicle collisions.  Whiplash injuries. This is an injury from a sudden forward and backward whipping movement of the head and neck. Falls.  Mild cervical sprains may be caused by:  Being in an awkward position, such as while cradling a telephone between your ear and shoulder.  Sitting in a chair that does not offer proper support.  Working at a poorly Landscape architect station.  Looking up or down for long periods of time.  SYMPTOMS  Pain, soreness, stiffness, or a burning sensation in the front, back, or sides of the neck. This discomfort may develop immediately after the injury or slowly, 24 hours or more after the injury.  Pain or tenderness directly in the middle of the back of the neck.  Shoulder or upper back pain.  Limited ability to move the neck.  Headache.  Dizziness.  Weakness, numbness, or tingling in the hands or arms.  Muscle spasms.  Difficulty swallowing or chewing.  Tenderness and swelling of the neck.  DIAGNOSIS  Most of the time your health care provider can diagnose a cervical sprain by taking your history and doing a physical exam. Your health care provider will ask about previous neck  injuries and any known neck problems, such as arthritis in the neck. X-rays may be taken to find out if there are any other problems, such as with the bones of the neck. Other tests, such as a CT scan or MRI, may also be needed.  TREATMENT  Treatment depends on the severity of the cervical sprain. Mild sprains can be treated with rest, keeping the neck in place (immobilization), and pain medicines. Severe cervical sprains are immediately immobilized. Further treatment is done to help with pain, muscle spasms, and other symptoms and may include: Medicines, such as pain relievers, numbing medicines, or muscle relaxants.  Physical therapy. This may involve stretching exercises, strengthening exercises, and posture training. Exercises and improved posture can help stabilize the neck, strengthen muscles, and help stop symptoms from returning.  HOME CARE INSTRUCTIONS  Put ice on the injured area.  Put ice in a plastic bag.  Place a towel  between your skin and the bag.  Leave the ice on for 15-20 minutes, 3-4 times a day.  If your injury was severe, you may have been given a cervical collar to wear. A cervical collar is a two-piece collar designed to keep your neck from moving while it heals. Do not remove the collar unless instructed by your health care provider. If you have long hair, keep it outside of the collar. Ask your health care provider before making any adjustments to your collar. Minor adjustments may be required over time to improve comfort and reduce pressure on your chin or on the back of your head. Ifyou are allowed to remove the collar for cleaning or bathing, follow your health care provider's instructions on how to do so safely. Keep your collar clean by wiping it with mild soap and water and drying it completely. If the collar you have been given includes removable pads, remove them every 1-2 days and hand wash them with soap and water. Allow them to air dry. They should be  completely dry before you wear them in the collar. If you are allowed to remove the collar for cleaning and bathing, wash and dry the skin of your neck. Check your skin for irritation or sores. If you see any, tell your health care provider. Do not drive while wearing the collar.  Only take over-the-counter or prescription medicines for pain, discomfort, or fever as directed by your health care provider.  Keep all follow-up appointments as directed by your health care provider.  Keep all physical therapy appointments as directed by your health care provider.  Make any needed adjustments to your workstation to promote good posture.  Avoid positions and activities that make your symptoms worse.  Warm up and stretch before being active to help prevent problems.  SEEK MEDICAL CARE IF:  Your pain is not controlled with medicine.  You are unable to decrease your pain medicine over time as planned.  Your activity level is not improving as expected.  SEEK IMMEDIATE MEDICAL CARE IF:  You develop any bleeding. You develop stomach upset. You have signs of an allergic reaction to your medicine.  Your symptoms get worse.  You develop new, unexplained symptoms.  You have numbness, tingling, weakness, or paralysis in any part of your body.  MAKE SURE YOU:  Understand these instructions. Will watch your condition. Will get help right away if you are not doing well or get worse.   This information is not intended to replace advice given to you by your health care provider. Make sure you discuss any questions you have with your health care provider.   Document Released: 03/19/2007 Document Revised: 05/27/2013 Document Reviewed: 11/27/2012 Elsevier Interactive Patient Education [employer].

## 2015-09-18 NOTE — ED Notes (Signed)
Pt  Reports     [facility]  She  States  She reports  Pain both  Ankles  Are   painfull  Pain  Behind   l   Leg  As  Well  As  A  Headache     And  Neck  Pain

## 2015-10-02 ENCOUNTER — Other Ambulatory Visit: Payer: Self-pay | Admitting: Internal Medicine

## 2015-10-04 DIAGNOSIS — G43719 Chronic migraine without aura, intractable, without status migrainosus: Secondary | ICD-10-CM | POA: Diagnosis not present

## 2015-10-22 ENCOUNTER — Other Ambulatory Visit: Payer: Self-pay | Admitting: Family Medicine

## 2015-10-25 NOTE — Telephone Encounter (Signed)
Contrave not on pt's current medication list. Pt has f/u with you on 10/28/15.  Please advise request?

## 2015-10-26 ENCOUNTER — Ambulatory Visit (INDEPENDENT_AMBULATORY_CARE_PROVIDER_SITE_OTHER): Payer: BLUE CROSS/BLUE SHIELD | Admitting: Internal Medicine

## 2015-10-26 ENCOUNTER — Encounter: Payer: Self-pay | Admitting: Internal Medicine

## 2015-10-26 VITALS — BP 120/70 | HR 100 | Ht 62.0 in | Wt 195.2 lb

## 2015-10-26 DIAGNOSIS — K219 Gastro-esophageal reflux disease without esophagitis: Secondary | ICD-10-CM | POA: Diagnosis not present

## 2015-10-26 DIAGNOSIS — K5900 Constipation, unspecified: Secondary | ICD-10-CM | POA: Diagnosis not present

## 2015-10-26 DIAGNOSIS — K3184 Gastroparesis: Secondary | ICD-10-CM

## 2015-10-26 MED ORDER — LINACLOTIDE 72 MCG PO CAPS: 72.0000 ug | ORAL_CAPSULE | Freq: Every day | ORAL | Status: DC

## 2015-10-26 MED ORDER — METOCLOPRAMIDE HCL 5 MG PO TABS: 5.0000 mg | ORAL_TABLET | Freq: Three times a day (TID) | ORAL | Status: DC

## 2015-10-26 MED ORDER — RANITIDINE HCL 150 MG PO TABS: 150.0000 mg | ORAL_TABLET | Freq: Two times a day (BID) | ORAL | Status: DC

## 2015-10-26 NOTE — Patient Instructions (Addendum)
We have sent the following medications to your pharmacy for you to pick up at your convenience: Linzess 72 mcg daily (in place of 145 mcg dosage) Ranitidine 150 mg twice daily (in place of pantoprazole) Reglan 5 mg three times daily before meals and at bedtime  You have been scheduled for EKG at [facility] on 10/27/15 @ 10 am. Please go to [facility]mitting [facility]) at 9:45 am.  Discontinue pantoprazole.  If your EKG comes back okay, we will discontinue Reglan and start your on domperidone.  If you are age 70 or older, your body mass index should be between 23-30. Your Body mass index is 35.69 kg/(m^2). If this is out of the aforementioned range listed, please consider follow up with your Primary Care Provider.  If you are age 91 or younger, your body mass index should be between 19-25. Your Body mass index is 35.69 kg/(m^2). If this is out of the aformentioned range listed, please consider follow up with your Primary Care Provider.

## 2015-10-27 ENCOUNTER — Other Ambulatory Visit (HOSPITAL_COMMUNITY): Payer: BLUE CROSS/BLUE SHIELD

## 2015-10-27 NOTE — Progress Notes (Signed)
Subjective:    Patient ID: Morgan Long, female    DOB: [birth year], 46 y.o.   MRN: [id number]  HPI Morgan Long is a 45 yo female with PMH of GERD, gastroparesis, Chronic constipation, migraines, endometriosis who returns today for follow-up. She had an upper endoscopy on 02/03/2015 which revealed food residue in the gastric body consistent with gastroparesis and the exam was otherwise normal. Biopsies were benign with mild focally active gastritis. Negative for H. pylori, metaplasia and dysplasia. Initially she was treated with PPI and after endoscopic findings she had continued epigastric pain, nausea, early satiety. Reglan was started 5 mg before meals and at bedtime. Reglan helped tremendously and improved all of her upper GI symptoms. She has tried to wean herself off the medication entirely but she has return of poor appetite, nausea, early satiety and worsening reflux. She also has associated abdominal bloating. She has restarted Reglan 5 mg 3 times a day before meals and at bedtime. She is currently having intermittent heartburn and using her pantoprazole intermittently. She continues Linzess 145 g every other day. She does have multiple urgent loose stools when she uses the medication. No blood in her stool or melena. Her weight has been stable but she very much would like to lose weight. She tried Contrave, but developed side effects and discontinued the medication.   Review of Systems As per HPI, otherwise negative  Current Medications, Allergies, Past Medical History, Past Surgical History, Family History and Social History were reviewed in [employer] record.     Objective:   Physical Exam BP 120/70 mmHg  Pulse 100  Ht 5\' 2"  (1.575 m)  Wt 195 lb 3.2 oz (88.542 kg)  BMI 35.69 kg/m2  LMP 09/29/2015 Constitutional: Well-developed and well-nourished. No distress. HEENT: Normocephalic and atraumatic. Marland Kitchen Conjunctivae are normal.  No scleral icterus. Neck:  Neck supple. Trachea midline. Cardiovascular: Normal rate, regular rhythm and intact distal pulses. No M/R/G Pulmonary/chest: Effort normal and breath sounds normal. No wheezing, rales or rhonchi. Abdominal: Soft, obese, nontender, nondistended. Bowel sounds active throughout.  Extremities: no clubbing, cyanosis, or edema Neurological: Alert and oriented to person place and time. Skin: Skin is warm and dry. Psychiatric: Normal mood and affect. Behavior is normal.  CBC    Component Value Date/Time   WBC 11.4* 03/19/2015 2245   RBC 4.52 03/19/2015 2245   HGB 13.1 03/19/2015 2245   HCT 38.9 03/19/2015 2245   PLT 377 03/19/2015 2245   MCV 86.1 03/19/2015 2245   MCH 29.0 03/19/2015 2245   MCHC 33.7 03/19/2015 2245   RDW 13.2 03/19/2015 2245   LYMPHSABS 2.4 03/19/2015 2245   MONOABS 0.9 03/19/2015 2245   EOSABS 0.0 03/19/2015 2245   BASOSABS 0.0 03/19/2015 2245    CMP     Component Value Date/Time   NA 137 03/19/2015 2245   K 3.7 03/19/2015 2245   CL 108 03/19/2015 2245   CO2 22 03/19/2015 2245   GLUCOSE 82 03/19/2015 2245   BUN 16 03/19/2015 2245   CREATININE 0.90 03/19/2015 2245   CALCIUM 8.7* 03/19/2015 2245   PROT 6.9 05/14/2014 1223   ALBUMIN 3.5 05/14/2014 1223   AST 21 05/14/2014 1223   ALT 30 05/14/2014 1223   ALKPHOS 48 05/14/2014 1223   BILITOT 0.4 05/14/2014 1223   GFRNONAA >60 03/19/2015 2245   GFRAA >60 03/19/2015 2245      Assessment & Plan:  45 yo female with PMH of GERD, gastroparesis, Chronic constipation, migraines, endometriosis who  returns today for follow-up.  1. Gastroparesis -- She has benefited greatly from the use of metoclopramide therapy. Symptoms return when the medication is discontinued. She is on relatively low-dose. We discussed the risk of long-term metoclopramide use particularly the neurologic complication, tardive dyskinesia. Tardive dyskinesia is rare but irreversible if that should occur. It seems she will need this medication on  ongoing basis and domperidone would be a better option without neurologic side effect. We discussed the rare risk of arrhythmia and she would need EKG before initiation to rule out QTC abnormalities. After this discussion she is interested in trying domperidone. She will be sent for EKG and if normal, will proceed with domperidone 10 mg before meals and at bedtime. This would replace Reglan. Gastroparesis diet recommended  2. GERD -- using PPI intermittently which we discussed is not ideal. Change ranitidine 150 mg twice a day before meals on an as-needed basis.  3. Chronic constipation -- using Linzess 145 g approximately every other day and having some loose stools. Will try to reduce the dose to Linzess 72 g daily. Call if ineffective  Six-month follow-up, sooner if necessary 25 minutes spent with the patient today. Greater than 50% was spent in counseling and coordination of care with the patient

## 2015-10-28 ENCOUNTER — Ambulatory Visit: Payer: Self-pay | Admitting: Family Medicine

## 2015-10-28 ENCOUNTER — Ambulatory Visit (HOSPITAL_COMMUNITY)
Admission: RE | Admit: 2015-10-28 | Discharge: 2015-10-28 | Disposition: A | Discharge status: Home / Self Care | Payer: BLUE CROSS/BLUE SHIELD | Source: Ambulatory Visit | Attending: Internal Medicine | Admitting: Internal Medicine

## 2015-10-28 DIAGNOSIS — K3184 Gastroparesis: Secondary | ICD-10-CM | POA: Insufficient documentation

## 2015-10-29 ENCOUNTER — Ambulatory Visit: Payer: BLUE CROSS/BLUE SHIELD | Admitting: Family Medicine

## 2015-10-29 DIAGNOSIS — Z0289 Encounter for other administrative examinations: Secondary | ICD-10-CM

## 2015-11-03 ENCOUNTER — Encounter: Payer: Self-pay | Admitting: Family Medicine

## 2015-11-03 ENCOUNTER — Ambulatory Visit (INDEPENDENT_AMBULATORY_CARE_PROVIDER_SITE_OTHER): Payer: BLUE CROSS/BLUE SHIELD | Admitting: Family Medicine

## 2015-11-03 VITALS — BP 112/80 | HR 94 | Ht 62.0 in | Wt 199.0 lb

## 2015-11-03 DIAGNOSIS — M7050 Other bursitis of knee, unspecified knee: Secondary | ICD-10-CM | POA: Insufficient documentation

## 2015-11-03 DIAGNOSIS — M7672 Peroneal tendinitis, left leg: Secondary | ICD-10-CM

## 2015-11-03 DIAGNOSIS — M7150 Other bursitis, not elsewhere classified, unspecified site: Secondary | ICD-10-CM | POA: Diagnosis not present

## 2015-11-03 MED ORDER — DICLOFENAC SODIUM 2 % TD SOLN: 2.0000 "application " | Freq: Two times a day (BID) | TRANSDERMAL | Status: DC

## 2015-11-03 NOTE — Assessment & Plan Note (Signed)
Left knee. We'll continue to monitor. We discussed icing regimen. Discussed topical anti-inflammatories. If worsening symptoms we'll consider injection or formal physical therapy.

## 2015-11-03 NOTE — Progress Notes (Signed)
[facility] [address], [place] [address] Phone: [phone] Subjective:    I'm seeing this patient by the request  of:    CC: Bilateral ankle pain, left greater than right left knee pain  [id number] Morgan Long is a 45 y.o. female coming in with complaint of left ankle pain. Patient on the 14th unfortunately rolled her left ankle. Had significant pain immediately. Went to urgent care and did have x-rays. X-rays were independently visualized by me. X-rays do not show any bony abnormality. Since then she's been having bilateral ankle pain more on the lateral aspect of the ankles. Left greater than right. Sometimes has an audible popping sensation that is severe and causes pain. States that usually followed by swelling. Does not matter what shoes she is wearing. States that they are just sore. Sometimes associated with swelling. Denies any numbness. Affecting some daily activities. Has not tried anything other than over-the-counter medications at this point.  Patient is complaining of some left knee pain. Seems to be more of a compensation. Hurts her more on the medial aspect of the knee. Does not remember injuring ago when she fell her knee may of guided twisted a little bit. Not locking or giving out on her just more sore.     Past Medical History  Diagnosis Date  . Endometriosis   . Headache(784.0)     occasional, dx w/ Migraines before, Topamax helps  . TMJ pain dysfunction syndrome     occasional  . Allergic rhinitis   . Sinusitis   . GERD (gastroesophageal reflux disease)   . Chronic migraine     Dr. Catalina Gravel  . Dry eye syndrome of both lacrimal glands   . Nuclear cataract of both eyes     Mild   Past Surgical History  Procedure Laterality Date  . Fallopian tube removed      Left  . Breast enhancement surgery  2006  . Endrometroisis     Social History   Social History  . Marital Status: Single    Spouse Name: N/A  . Number  of Children: 0  . Years of Education: BS   Occupational History  . 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 Smoking status: Never Smoker   . Smokeless tobacco: Never Used  . Alcohol Use: 0.0 oz/week    0 Standard drinks or equivalent per week     Comment: socially   . Drug Use: No  . Sexual Activity: No   Other Topics Concern  . Not on file   Social History Narrative   Household:sister and her 3 kids    Drinks occasional starbucks drink          Allergies  Allergen Reactions  . Acetaminophen Itching  . Hydrocodone Nausea Only  . Oxycodone Itching  . Phosphate Itching    sick  . Codeine Itching and Nausea Only   Family History  Problem Relation Age of Onset  . Diabetes      grandmother  . Hypertension Maternal Aunt     several family members  . Throat cancer Maternal Uncle   . Colon cancer Neg Hx   . Heart attack Neg Hx   . Throat cancer Mother   . Heart disease Father   . Breast cancer Maternal Aunt     breast  . Breast cancer Maternal Aunt     breast  .  Pancreatic cancer Maternal Aunt   . Asthma      cousin, maternal  . Breast cancer Maternal Aunt     total of 5 aunts  . Breast cancer Maternal Aunt     Past medical history, social, surgical and family history all reviewed in electronic medical record.  No pertanent information unless stated regarding to the chief complaint.   Review of Systems: No headache, visual changes, nausea, vomiting, diarrhea, constipation, dizziness, abdominal pain, skin rash, fevers, chills, night sweats, weight loss, swollen lymph nodes,  chest pain, shortness of breath, mood changes.   Objective Blood pressure 112/80, pulse 94, weight 199 lb (90.266 kg), last menstrual period 09/29/2015, SpO2 97 %.  General: No apparent distress alert and oriented x3 mood and affect normal, dressed appropriately.  HEENT: Pupils equal, extraocular movements intact  Respiratory:  Patient's speak in full sentences and does not appear short of breath  Cardiovascular: No lower extremity edema, non tender, no erythema  Skin: Warm dry intact with no signs of infection or rash on extremities or on axial skeleton.  Abdomen: Soft nontender  Neuro: Cranial nerves II through XII are intact, neurovascularly intact in all extremities with 2+ DTRs and 2+ pulses.  Lymph: No lymphadenopathy of posterior or anterior cervical chain or axillae bilaterally.  Gait normal with good balance and coordination.  MSK:  Non tender with full range of motion and good stability and symmetric strength and tone of shoulders, elbows, wrist, hip, knee and bilaterally. Patient's wrist does have a ganglion cyst that is very small on the palmar aspect near the thumb. Patient's left knee does have some mild tenderness over the pes anserine area but otherwise unremarkable. Ankle: Left  Trace swelling noted over the lateral aspect of the left ankle. Minimal over the right side Range of motion is full in all directions. Patient states that there is discomfort bilaterally Strength is 4/5 in all directions. Poor participation by patient. Stable lateral and medial ligaments; squeeze test and kleiger test unremarkable; patient has pain that is out of proportion from the amount of testing Talar dome nontender; No pain at base of 5th MT; No tenderness over cuboid; No tenderness over N spot or navicular prominence No tenderness on posterior aspects of lateral and medial malleolus Operative tenderness over the peroneal tendon Negative tarsal tunnel tinel's Able to walk 4 steps. Contralateral ankle also tender to palpation that is out of proportion for the amount of pressure we're using on exam. Seems stable. Neurovascular intact.  Procedure note [id number]; 15 minutes spent for Therapeutic exercises as stated in above notes.  This included exercises focusing on stretching, strengthening, with significant focus on  eccentric aspects. Ankle strengthening that included:  Basic range of motion exercises to allow proper full motion at ankle Stretching of the lower leg and hamstrings  Theraband exercises for the lower leg - inversion, eversion, dorsiflexion and plantarflexion each to be completed with a theraband Balance exercises to increase proprioception Weight bearing exercises to increase strength and balance  Proper technique shown and discussed handout in great detail with ATC.  All questions were discussed and answered.     Impression and Recommendations:     This case required medical decision making of moderate complexity.      Note: This dictation was prepared with Dragon dictation along with smaller phrase technology. Any transcriptional errors that result from this process are unintentional.

## 2015-11-03 NOTE — Patient Instructions (Signed)
Good to see you Ice 20 minutes 2 times daily. Usually after activity and before bed. Exercises 3 times a week.  Air cast with a lot of walking or a lot of activity  pennsaid pinkie amount topically 2 times daily as needed.  When sitting do move your foot as much as possible to pump some of the fluid out.  Vitamin D 2000 IU daily  For the knee I think it is your hamstring.  Try a compression sleeve ([facility]) for the thigh to wear with activity and it should help Good shoes with a rigid bottom can help See me again in 3 weeks to make sure you are doing well.

## 2015-11-03 NOTE — Progress Notes (Signed)
Pre visit review using our clinic review tool, if applicable. No additional management support is needed unless otherwise documented below in the visit note. 

## 2015-11-03 NOTE — Assessment & Plan Note (Addendum)
Patient does have more of a peroneal tendinitis. We discussed icing regimen and home exercises. We discussed which activities doing which ones to avoid. Patient work with Product/process development scientist. Patient given an Aircast. I believe the right side is more compensation the patient is tender. Has had trouble with an elevated CRP previously but her regular ESR. If patient continues to have pain possible labs will be needed. Patient also may need formal physical therapy. Follow-up again in 3-4 weeks for further evaluation. Topical anti-inflammatory's prescribed

## 2015-11-09 ENCOUNTER — Telehealth: Payer: Self-pay | Admitting: Internal Medicine

## 2015-11-09 NOTE — Telephone Encounter (Signed)
Discussed with pt her diagnosis of gastroparesis and that it possibly could get better but some individuals continue to have the symptoms.

## 2015-11-15 DIAGNOSIS — N390 Urinary tract infection, site not specified: Secondary | ICD-10-CM | POA: Diagnosis not present

## 2015-11-15 DIAGNOSIS — N760 Acute vaginitis: Secondary | ICD-10-CM | POA: Diagnosis not present

## 2015-11-15 DIAGNOSIS — N83299 Other ovarian cyst, unspecified side: Secondary | ICD-10-CM | POA: Diagnosis not present

## 2015-11-15 DIAGNOSIS — Z113 Encounter for screening for infections with a predominantly sexual mode of transmission: Secondary | ICD-10-CM | POA: Diagnosis not present

## 2015-11-16 ENCOUNTER — Encounter: Payer: Self-pay | Admitting: Internal Medicine

## 2015-11-24 ENCOUNTER — Ambulatory Visit: Payer: BLUE CROSS/BLUE SHIELD | Admitting: Family Medicine

## 2015-11-24 DIAGNOSIS — Z0289 Encounter for other administrative examinations: Secondary | ICD-10-CM

## 2015-11-29 ENCOUNTER — Encounter: Payer: Self-pay | Admitting: Family Medicine

## 2015-12-16 DIAGNOSIS — G43719 Chronic migraine without aura, intractable, without status migrainosus: Secondary | ICD-10-CM | POA: Diagnosis not present

## 2015-12-21 DIAGNOSIS — H04123 Dry eye syndrome of bilateral lacrimal glands: Secondary | ICD-10-CM | POA: Diagnosis not present

## 2015-12-21 DIAGNOSIS — H2513 Age-related nuclear cataract, bilateral: Secondary | ICD-10-CM | POA: Diagnosis not present

## 2015-12-27 ENCOUNTER — Encounter: Payer: Self-pay | Admitting: Internal Medicine

## 2015-12-27 NOTE — Telephone Encounter (Signed)
EKG was normal as was QTc interval As previously discussed Reglan can be discontinued and domperidone 10 mg 3 times a day before meals and at bedtime can be started Patient should follow-up in 3-4 months for continuity

## 2015-12-29 ENCOUNTER — Other Ambulatory Visit: Payer: Self-pay | Admitting: Internal Medicine

## 2015-12-29 NOTE — Progress Notes (Signed)
[facility] [place], [place] [address] Phone: [phone] Subjective:    I'm seeing this patient by the request  of:    CC: Bilateral ankle pain, left greater than right left knee pain  [id number]  Morgan Long is a 45 y.o. female coming in with complaint of left ankle pain. Patient on the 14th on june unfortunately rolled her left ankle. Had significant pain immediately. Went to urgent care and did have x-rays. X-rays were independently visualized by me. X-rays do not show any bony abnormality. Patient sent have peroneal tendinitisPatient was to do home exercises, icing, as well as bracing. Patient states she's been doing the exercises occasionally. Topical anti-inflammatories were somewhat beneficial. Continues to have the pain and would like it to be completely resolved. Would state that she is only 25-30% better. Patient states that the left side is better than the right side. Continues though to have some swelling when walking a lot. Patient wants to be more active because she continues to gain weight.  Left knee pain seems significantly better.     Past Medical History:  Diagnosis Date  . Allergic rhinitis   . Chronic migraine    Dr. Catalina Gravel  . Dry eye syndrome of both lacrimal glands   . Endometriosis   . GERD (gastroesophageal reflux disease)   . Headache(784.0)    occasional, dx w/ Migraines before, Topamax helps  . Nuclear cataract of both eyes    Mild  . Sinusitis   . TMJ pain dysfunction syndrome    occasional   Past Surgical History:  Procedure Laterality Date  . BREAST ENHANCEMENT SURGERY  2006  . endrometroisis    . fallopian tube removed     Left   Social History   Social History  . Marital status: Single    Spouse name: N/A  . Number of children: 0  . Years of education: BS   Occupational History  . 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 Smoking status: Never Smoker  . Smokeless tobacco: Never Used  . Alcohol use 0.0 oz/week     Comment: socially   . Drug use: No  . Sexual activity: No   Other Topics Concern  . None   Social History Narrative   Household:sister and her 3 kids    Drinks occasional starbucks drink          Allergies  Allergen Reactions  . Acetaminophen Itching  . Hydrocodone Nausea Only  . Oxycodone Itching  . Phosphate Itching    sick  . Codeine Itching and Nausea Only   Family History  Problem Relation Age of Onset  . Diabetes      grandmother  . Hypertension Maternal Aunt     several family members  . Throat cancer Maternal Uncle   . Colon cancer Neg Hx   . Heart attack Neg Hx   . Throat cancer Mother   . Heart disease Father   . Breast cancer Maternal Aunt     breast  . Breast cancer Maternal Aunt     breast  . Pancreatic cancer Maternal Aunt   . Asthma      cousin, maternal  . Breast cancer Maternal Aunt     total of 5 aunts  . Breast cancer Maternal Aunt     Past medical history, social, surgical and family history all  reviewed in electronic medical record.  No pertanent information unless stated regarding to the chief complaint.   Review of Systems: No headache, visual changes, nausea, vomiting, diarrhea, constipation, dizziness, abdominal pain, skin rash, fevers, chills, night sweats, weight loss, swollen lymph nodes,  chest pain, shortness of breath, mood changes.   Objective  Blood pressure 114/76, pulse 88, weight 205 lb (93 kg).  General: No apparent distress alert and oriented x3 mood and affect normal, dressed appropriately.  HEENT: Pupils equal, extraocular movements intact  Respiratory: Patient's speak in full sentences and does not appear short of breath  Cardiovascular: No lower extremity edema, non tender, no erythema  Skin: Warm dry intact with no signs of infection or rash on extremities or on axial skeleton.  Abdomen: Soft nontender    Neuro: Cranial nerves II through XII are intact, neurovascularly intact in all extremities with 2+ DTRs and 2+ pulses.  Lymph: No lymphadenopathy of posterior or anterior cervical chain or axillae bilaterally.  Gait normal with good balance and coordination.  MSK:  Non tender with full range of motion and good stability and symmetric strength and tone of shoulders, elbows, wrist, hip, knee and bilaterally.   Ankle: right Swelling that was noted previously is completely resolved over the lateral malleolus Range of motion is full in all directions. Patient states that there is discomfort bilaterally Strength is 4/5 in all directions. Poor participation by patient. Stable lateral and medial ligaments; squeeze test and kleiger test unremarkable; patient has pain that is out of proportion from the amount of testing Talar dome nontender; No pain at base of 5th MT; No tenderness over cuboid; No tenderness over N spot or navicular prominence No tenderness on posterior aspects of lateral and medial malleolus Mild tenderness over the peroneal tendon as well as the ATFL Negative tarsal tunnel tinel's Able to walk 4 steps. Contralateral ankle also hurts over the lateral aspect.     Impression and Recommendations:     This case required medical decision making of moderate complexity.      Note: This dictation was prepared with Dragon dictation along with smaller phrase technology. Any transcriptional errors that result from this process are unintentional.

## 2015-12-30 ENCOUNTER — Encounter: Payer: Self-pay | Admitting: Family Medicine

## 2015-12-30 ENCOUNTER — Ambulatory Visit (INDEPENDENT_AMBULATORY_CARE_PROVIDER_SITE_OTHER): Payer: BLUE CROSS/BLUE SHIELD | Admitting: Family Medicine

## 2015-12-30 VITALS — BP 114/76 | HR 88 | Wt 205.0 lb

## 2015-12-30 DIAGNOSIS — M7672 Peroneal tendinitis, left leg: Secondary | ICD-10-CM

## 2015-12-30 NOTE — Patient Instructions (Addendum)
Good to see you  Ice is your friend still  Try not to lace the middle eye on the shoe and see if that helps [employer]enco orthotics "total support" online would be great  pennsaid pinkie amount topically 2 times daily as needed.  Physical therapy will be calling you  Possibly lower impact exercises like biking or elliptical coul be good See me again in 4-6 weeks.

## 2015-12-30 NOTE — Assessment & Plan Note (Addendum)
Patient continues to have some mild discomfort. I do believe that she has more of a lateral column overload causing the peroneal tendinitis and tightness of the deltoid ligament. Patient will be referred to formal physical therapy that I think will be beneficial. Encourage weight loss. We discussed icing regimen and continuing the topical anti-inflammatories. We discussed which activities to do in which ones to potentially avoid. Patient will follow-up with me again in 4-6 weeks for further evaluation and treatment. Spent  25 minutes with patient face-to-face and had greater than 50% of counseling including as described above in assessment and plan.

## 2015-12-31 ENCOUNTER — Other Ambulatory Visit: Payer: Self-pay | Admitting: *Deleted

## 2015-12-31 ENCOUNTER — Telehealth: Payer: Self-pay | Admitting: *Deleted

## 2015-12-31 MED ORDER — AMBULATORY NON FORMULARY MEDICATION: 2 refills | Status: DC

## 2015-12-31 NOTE — Telephone Encounter (Signed)
Left a message for patient to call me. 

## 2015-12-31 NOTE — Telephone Encounter (Signed)
error 

## 2015-12-31 NOTE — Telephone Encounter (Signed)
Patient given recommendations. Rx faxed to [facility] at [phone]. Recall in EPIC.

## 2015-12-31 NOTE — Telephone Encounter (Signed)
I commented on this yesterday in response to the patient's email. Did you see that comment? With the new Epic upgrade, I believe things are harder to see.   My comment was:    EKG was normal as was QTc interval  As previously discussed Reglan can be discontinued and domperidone 10 mg 3 times a day before meals and at bedtime can be started  Patient should follow-up in 3-4 months for continuity    Thanks  JMP    ----- Message -----  From: Hulan Saas, RN  Sent: 12/28/2015  8:32 AM  To: Jerene Bears, MD

## 2016-01-03 ENCOUNTER — Encounter: Payer: Self-pay | Admitting: Internal Medicine

## 2016-01-12 ENCOUNTER — Encounter: Payer: Self-pay | Admitting: Family Medicine

## 2016-01-16 ENCOUNTER — Encounter: Payer: Self-pay | Admitting: Family Medicine

## 2016-01-31 ENCOUNTER — Encounter: Payer: Self-pay | Admitting: Family Medicine

## 2016-01-31 ENCOUNTER — Encounter: Payer: Self-pay | Admitting: *Deleted

## 2016-01-31 ENCOUNTER — Ambulatory Visit (INDEPENDENT_AMBULATORY_CARE_PROVIDER_SITE_OTHER): Payer: BLUE CROSS/BLUE SHIELD | Admitting: Family Medicine

## 2016-01-31 DIAGNOSIS — M7672 Peroneal tendinitis, left leg: Secondary | ICD-10-CM

## 2016-01-31 DIAGNOSIS — G43719 Chronic migraine without aura, intractable, without status migrainosus: Secondary | ICD-10-CM | POA: Diagnosis not present

## 2016-01-31 NOTE — Assessment & Plan Note (Signed)
Patient seems to be doing relatively well. We discussed icing regimen and home exercises. We discussed which activities doing which ones to avoid. Patient will brace as needed. His lungs patient is well she can follow-up as needed.

## 2016-01-31 NOTE — Patient Instructions (Signed)
Good to see you Overall I am impressed.  You are doing well Try to find shoes a little wider then what you have Spenco orthotics "total support" online would be great and they come in "slim" check amazon.  Ice is your friend I think you will do well and when you increase activity it should do great.  Losing 5-10 pounds will be great! See me again when you need me.

## 2016-01-31 NOTE — Progress Notes (Addendum)
[facility] [place], [place] [address] Phone: [phone] Subjective:    I  CC: Bilateral ankle pain, left greater than right  Follow-up  [id number]  Morgan Long is a 45 y.o. female coming in with complaint of left ankle pain. Patient on the 14th on April (previous note said June which was incorrect) unfortunately rolled her left ankle. Had significant pain immediately. Went to urgent care and did have x-rays. X-rays were independently visualized by me. X-rays do not show any bony abnormality. Patient did have more of a peroneal tendinitis. Was sent to formal physical therapy. Patient states that she has been doing the exercises and has noticed some improvement with both ankles. States she feels that she wears the right she isn't is the exercises she seems to do relatively well. States that overall she is probably 80% better.     Past Medical History:  Diagnosis Date  . Allergic rhinitis   . Chronic migraine    Dr. Catalina Gravel  . Dry eye syndrome of both lacrimal glands   . Endometriosis   . GERD (gastroesophageal reflux disease)   . Headache(784.0)    occasional, dx w/ Migraines before, Topamax helps  . Nuclear cataract of both eyes    Mild  . Sinusitis   . TMJ pain dysfunction syndrome    occasional   Past Surgical History:  Procedure Laterality Date  . BREAST ENHANCEMENT SURGERY  2006  . endrometroisis    . fallopian tube removed     Left   Social History   Social History  . Marital status: Single    Spouse name: N/A  . Number of children: 0  . Years of education: BS   Occupational History  . 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 Smoking status: Never Smoker  . Smokeless tobacco: Never Used  . Alcohol use 0.0 oz/week     Comment: socially   . Drug use: No  . Sexual activity: No   Other Topics Concern  . None   Social History  Narrative   Household:sister and her 3 kids    Drinks occasional starbucks drink          Allergies  Allergen Reactions  . Acetaminophen Itching  . Hydrocodone Nausea Only  . Oxycodone Itching  . Phosphate Itching    sick  . Codeine Itching and Nausea Only   Family History  Problem Relation Age of Onset  . Diabetes      grandmother  . Hypertension Maternal Aunt     several family members  . Throat cancer Maternal Uncle   . Colon cancer Neg Hx   . Heart attack Neg Hx   . Throat cancer Mother   . Heart disease Father   . Breast cancer Maternal Aunt     breast  . Breast cancer Maternal Aunt     breast  . Pancreatic cancer Maternal Aunt   . Asthma      cousin, maternal  . Breast cancer Maternal Aunt     total of 5 aunts  . Breast cancer Maternal Aunt     Past medical history, social, surgical and family history all reviewed in electronic medical record.  No pertanent information unless stated regarding to the chief complaint.   Review of Systems: No headache, visual changes, nausea, vomiting, diarrhea, constipation, dizziness, abdominal pain, skin rash, fevers, chills,  night sweats, weight loss, swollen lymph nodes,  chest pain, shortness of breath, mood changes.   Objective  Blood pressure 122/74, pulse (!) 105, weight 203 lb (92.1 kg), SpO2 97 %.  General: No apparent distress alert and oriented x3 mood and affect normal, dressed appropriately.  HEENT: Pupils equal, extraocular movements intact  Respiratory: Patient's speak in full sentences and does not appear short of breath  Cardiovascular: No lower extremity edema, non tender, no erythema  Skin: Warm dry intact with no signs of infection or rash on extremities or on axial skeleton.  Abdomen: Soft nontender  Neuro: Cranial nerves II through XII are intact, neurovascularly intact in all extremities with 2+ DTRs and 2+ pulses.  Lymph: No lymphadenopathy of posterior or anterior cervical chain or axillae bilaterally.    Gait normal with good balance and coordination.  MSK:  Non tender with full range of motion and good stability and symmetric strength and tone of shoulders, elbows, wrist, hip, knee and bilaterally.   Ankle: right Swelling has resolved. Range of motion is full in all directions. Patient states that there is discomfort bilaterally still present Strength is 4/5 in all directions. Poor participation by patient. Stable lateral and medial ligaments; squeeze test and kleiger test  No pain at base of 5th MT; No tenderness over cuboid; No tenderness over N spot or navicular prominence No tenderness on posterior aspects of lateral and medial malleolus Mild tenderness still on the peroneal tendons as well as the ATFL bilaterally record of the left Negative tarsal tunnel tinel's Able to walk 4 steps. Contralateral ankle also hurts over the lateral aspect. Foot examination of the breakdown of the transverse arch.    Impression and Recommendations:     This case required medical decision making of moderate complexity.      Note: This dictation was prepared with Dragon dictation along with smaller phrase technology. Any transcriptional errors that result from this process are unintentional.

## 2016-02-08 DIAGNOSIS — Z3200 Encounter for pregnancy test, result unknown: Secondary | ICD-10-CM | POA: Diagnosis not present

## 2016-02-08 DIAGNOSIS — Z113 Encounter for screening for infections with a predominantly sexual mode of transmission: Secondary | ICD-10-CM | POA: Diagnosis not present

## 2016-02-08 DIAGNOSIS — N951 Menopausal and female climacteric states: Secondary | ICD-10-CM | POA: Diagnosis not present

## 2016-02-08 DIAGNOSIS — Z114 Encounter for screening for human immunodeficiency virus [HIV]: Secondary | ICD-10-CM | POA: Diagnosis not present

## 2016-02-08 DIAGNOSIS — N760 Acute vaginitis: Secondary | ICD-10-CM | POA: Diagnosis not present

## 2016-02-08 DIAGNOSIS — Z1159 Encounter for screening for other viral diseases: Secondary | ICD-10-CM | POA: Diagnosis not present

## 2016-02-16 ENCOUNTER — Encounter: Payer: Self-pay | Admitting: Internal Medicine

## 2016-02-27 ENCOUNTER — Other Ambulatory Visit: Payer: Self-pay | Admitting: Internal Medicine

## 2016-03-08 DIAGNOSIS — G43719 Chronic migraine without aura, intractable, without status migrainosus: Secondary | ICD-10-CM | POA: Diagnosis not present

## 2016-03-13 DIAGNOSIS — L650 Telogen effluvium: Secondary | ICD-10-CM | POA: Diagnosis not present

## 2016-03-13 DIAGNOSIS — L659 Nonscarring hair loss, unspecified: Secondary | ICD-10-CM | POA: Diagnosis not present

## 2016-03-13 DIAGNOSIS — Z79899 Other long term (current) drug therapy: Secondary | ICD-10-CM | POA: Diagnosis not present

## 2016-03-13 DIAGNOSIS — L658 Other specified nonscarring hair loss: Secondary | ICD-10-CM | POA: Diagnosis not present

## 2016-03-13 DIAGNOSIS — Z5181 Encounter for therapeutic drug level monitoring: Secondary | ICD-10-CM | POA: Diagnosis not present

## 2016-03-20 ENCOUNTER — Other Ambulatory Visit: Payer: Self-pay | Admitting: Internal Medicine

## 2016-03-24 ENCOUNTER — Ambulatory Visit: Payer: BLUE CROSS/BLUE SHIELD | Attending: Family Medicine | Admitting: Physical Therapy

## 2016-03-24 DIAGNOSIS — M6281 Muscle weakness (generalized): Secondary | ICD-10-CM | POA: Insufficient documentation

## 2016-03-24 DIAGNOSIS — M25571 Pain in right ankle and joints of right foot: Secondary | ICD-10-CM | POA: Insufficient documentation

## 2016-03-24 DIAGNOSIS — G43719 Chronic migraine without aura, intractable, without status migrainosus: Secondary | ICD-10-CM | POA: Diagnosis not present

## 2016-03-24 DIAGNOSIS — R600 Localized edema: Secondary | ICD-10-CM | POA: Diagnosis not present

## 2016-03-24 DIAGNOSIS — M25572 Pain in left ankle and joints of left foot: Secondary | ICD-10-CM | POA: Insufficient documentation

## 2016-03-24 NOTE — Patient Instructions (Signed)
   At the store/online:  [employer], [employer]  Orthotics  "Superfeet" brand $40  (overpronating)                        Spenco  Compression socks to help with swelling   Ice/elevation    Towel scrunches with towel on floor    Towel stretch for heel cord hold 20 sec 3x    ----3x/day     Ruben Im PT [facility] [address], [place], [place] [address] Phone # [phone] Fax [phone]

## 2016-03-24 NOTE — Therapy (Signed)
[facility] [address] [place], [place], [address] Phone: [phone]   Fax:  [phone]  Physical Therapy Evaluation  Patient Details  Name: Morgan Long MRN: [id number] Date of Birth: 11/14/1970 Referring Provider: Dr. Tamala Julian  Encounter Date: 03/24/2016      PT End of Session - 03/24/16 1159    Visit Number 1   Number of Visits 29   Date for PT Re-Evaluation 05/19/16   Authorization Type 30 visit limit --1 used already   PT Start Time 1100   PT Stop Time 1147   PT Time Calculation (min) 47 min   Activity Tolerance Patient tolerated treatment well      Past Medical History:  Diagnosis Date  . Allergic rhinitis   . Chronic migraine    Dr. Catalina Gravel  . Dry eye syndrome of both lacrimal glands   . Endometriosis   . GERD (gastroesophageal reflux disease)   . Headache(784.0)    occasional, dx w/ Migraines before, Topamax helps  . Nuclear cataract of both eyes    Mild  . Sinusitis   . TMJ pain dysfunction syndrome    occasional    Past Surgical History:  Procedure Laterality Date  . BREAST ENHANCEMENT SURGERY  2006  . endrometroisis    . fallopian tube removed     Left    There were no vitals filed for this visit.       Subjective Assessment - 03/24/16 1101    [place]ive [place] end of [place].  Injured both ankles, minimally knee and back.  Night swelling.  Right and left anterior, lateral and posterior pain.   Had PT in summer using bands no improvement.  States she gained weight b/c she can't exercise.  No inserts.     Pertinent History left bunionectomy   Limitations Walking   How long can you walk comfortably? < 1/4 mile   Diagnostic tests x-ray after fall    Patient Stated Goals get back to regular ex regimen, stop swelling;    Currently in Pain? Yes   Pain Location Ankle   Pain Orientation Right;Left   Pain Type Chronic pain   Pain  Onset More than a month ago   Pain Frequency Constant   Aggravating Factors  walking, standing, sitting for long periods of time   Pain Relieving Factors elevate legs;  ice            [facility] PT Assessment - 03/24/16 0001      Assessment   Medical Diagnosis left peroneal tendonitis, right ankle pain   Referring Provider Dr. Tamala Julian   Onset Date/Surgical Date --  April 2017   Hand Dominance Right   Next MD Visit November   Prior Therapy Had 1 visit for HEP      Precautions   Precautions None     Restrictions   Weight Bearing Restrictions No     Balance Screen   Has the patient fallen in the past 6 months No   Has the patient had a decrease in activity level because of a fear of falling?  No   Is the patient reluctant to leave their home because of a fear of falling?  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   Leisure movies; fishing     Observation/Other Assessments   Focus on Therapeutic Outcomes (FOTO)  63% limitation     Observation/Other Assessments-Edema    Edema --  lateral ankle swelling around malleoli bilaterally      Posture/Postural Control   Posture/Postural Control Postural limitations   Posture Comments overpronation left > right     ROM / Strength   AROM / PROM / Strength AROM;Strength     AROM   Overall AROM Comments decreased toe flexion and extension;  decreased metatarsal and calcaneal mobility   AROM Assessment Site Ankle   Right/Left Ankle Right;Left   Right Ankle Dorsiflexion 3   Right Ankle Plantar Flexion 30   Right Ankle Inversion 12   Right Ankle Eversion 10   Left Ankle  Dorsiflexion 3   Left Ankle Plantar Flexion 32   Left Ankle Inversion 23   Left Ankle Eversion 11     Strength   Overall Strength Comments 3/5 bilateral toe intrinsic strength   Strength Assessment Site Ankle   Right/Left Ankle Right;Left   Right Ankle Dorsiflexion 3+/5   Right Ankle Plantar Flexion 3+/5   Right Ankle Inversion 3+/5   Right Ankle Eversion 3+/5   Left Ankle Dorsiflexion 3+/5   Left Ankle Plantar Flexion 3+/5   Left Ankle Inversion 3+/5   Left Ankle Eversion 3+/5     Palpation   Palpation comment tender points in bilateral gastrocs and anterior tibialis     Great Toe Extension Test    Comments painful bilaterally     Dorsiflexion-Eversion Test   Findings Positive   Side Right;Left                           PT Education - 03/24/16 1158    Education provided Yes   Education Details orthotics and compression sock recommendation;  towel scrunches for instrinsic strengthening;  gastroc stretch with towel seated   Person(s) Educated Patient   Methods Explanation;Demonstration;Handout   Comprehension Verbalized understanding;Returned demonstration          PT Short Term Goals - 03/24/16 1210      PT SHORT TERM GOAL #1   Title The patient will report a good understanding of self care strategies for pain and swelling control including compression socks, orthotics, elevation, use of ice, basic ROM   04/20/16   Time 4   Period Weeks   Status New     PT SHORT TERM GOAL #2   Title The patient will report a 25% improvement in bilateral ankle pain and swelling with usual ADLs   Time 4   Period Weeks   Status New     PT SHORT TERM GOAL #3   Title The patient will have improved bilateral ankle dorsiflexion to 6 degrees needed for ROM to ascend and descend stairs   Time 4   Period Weeks   Status New           PT Long Term Goals - 03/24/16 1213      PT LONG TERM GOAL #1   Title The patient will be independent in safe self progression  of HEP for further improvements in ROM and strength  05/19/16   Time 8   Period Weeks   Status New     PT LONG TERM GOAL #2   Title The patient will report a 60% improvement in pain and swelling with home and work ADLS   Time 8   Period Weeks   Status New     PT LONG TERM GOAL #3   Title The patient will have improved ankle plantarflexion to 50 degrees, dorsiflexion to 8 degrees, eversion to 14 degrees needed for ambulation on uneven surfaces   Time 8   Period Weeks   Status New     PT LONG TERM GOAL #4   Title The patient will be able to walk > 1/2 mile for community mobility    Time 8   Period Weeks   Status New     PT LONG TERM GOAL #5   Title Ankle strength grossly 4/5 needed for standing/walking longer periods of time   Time 8   Period Weeks   Status New     Additional Long Term Goals   Additional Long Term Goals Yes     PT LONG TERM GOAL #6   Title FOTO functional outcome score improved from 63% to 42% indicating improved function with less pain   Time 8   Period Weeks   Status New               Plan - 03/24/16 1200    Clinical Impression Statement The patient had a fall in April 2017 resulting in bilateral ankle pain.  She has minor knee and low back pain as well but her ankle pain and swelling is her primary complaint.  X-rays after fall negative.  She had 1 visit of PT in the summer for HEP instruction.  She was given theraband for home use but painful to do.  Bilateral pes planus secondary to hyperpronation.  Lateral swelling around malleoli.  Tender points gastrocs, peroneals and anterior tibialis.  Decreased joint mobility talocural, calcaneal, metatarsal joints.  Decreased ankle AROM in all planes and painful.  Ankle and toe strength grossly 3+/5 with pain with resisted movements.  She would benefit from PT to address these deficits.  She is of low complexity evaluation secondary to no co-morbidities and good home support.     Rehab Potential Good   PT  Frequency 2x / week   PT Duration 8 weeks   PT Treatment/Interventions ADLs/Self Care Home Management;Cryotherapy;Electrical Stimulation;Iontophoresis 4mg /ml Dexamethasone;Ultrasound;Therapeutic exercise;Patient/family education;Manual techniques;Dry needling;Taping;Vasopneumatic Device   PT Next Visit Plan ankle ROM ex's (rocker board, bike or Nu-Step); intrinsic strengthening;  kinesotaping; iontophoresis if cert signed by MD;  vasocompression      Patient will benefit from skilled therapeutic intervention in order to improve the following deficits and impairments:  Pain, Increased edema, Decreased strength, Decreased range of motion  Visit Diagnosis: Pain in right ankle and joints of right foot - Plan: PT plan of care cert/re-cert  Pain in left ankle and joints of left foot - Plan: PT plan of care cert/re-cert  Localized edema - Plan: PT plan of care cert/re-cert  Muscle weakness (generalized) - Plan: PT plan of care cert/re-cert     Problem List Patient Active Problem List   Diagnosis Date Noted  . Peroneal tendinitis of left lower extremity 11/03/2015  . Pes anserine bursitis 11/03/2015  . Asthma with acute exacerbation 06/15/2015  . Acute sinusitis 06/15/2015  . PCP NOTES >>>> 02/23/2015  . GERD (gastroesophageal reflux disease) 01/13/2015  . Myalgia and  myositis 07/22/2014  . Paresthesias 06/02/2014  . Acute pharyngitis 05/20/2014  . Strep pharyngitis 05/20/2014  . Degenerative cervical disc 10/28/2013  . Bursitis, shoulder 09/17/2013  . Muscle spasm of back 09/11/2013  . Nonallopathic lesion of thoracic region 09/11/2013  . General medical examination 03/28/2011  . Allergic rhinitis   . TMJ PAIN 12/16/2007  . Headache(784.0) 12/16/2007   Ruben Im, PT 03/24/16 12:23 PM Phone: [phone] Fax: [phone]  Alvera Singh 03/24/2016, 12:22 PM  [facility] [address], [place], [place],  [address] Phone: [phone]   Fax:  [phone]  Name: Morgan Long MRN: [id number] Date of Birth: Aug 24, 1970

## 2016-03-28 DIAGNOSIS — H16223 Keratoconjunctivitis sicca, not specified as Sjogren's, bilateral: Secondary | ICD-10-CM | POA: Diagnosis not present

## 2016-03-28 DIAGNOSIS — H04123 Dry eye syndrome of bilateral lacrimal glands: Secondary | ICD-10-CM | POA: Diagnosis not present

## 2016-03-30 ENCOUNTER — Ambulatory Visit: Payer: BLUE CROSS/BLUE SHIELD | Admitting: Physical Therapy

## 2016-03-30 DIAGNOSIS — M6281 Muscle weakness (generalized): Secondary | ICD-10-CM | POA: Diagnosis not present

## 2016-03-30 DIAGNOSIS — M25571 Pain in right ankle and joints of right foot: Secondary | ICD-10-CM | POA: Diagnosis not present

## 2016-03-30 DIAGNOSIS — M25572 Pain in left ankle and joints of left foot: Secondary | ICD-10-CM

## 2016-03-30 DIAGNOSIS — R600 Localized edema: Secondary | ICD-10-CM | POA: Diagnosis not present

## 2016-03-30 NOTE — Therapy (Signed)
[facility] [address] [place], [place], [address] Phone: [phone]   Fax:  [phone]  Physical Therapy Treatment  Patient Details  Name: Morgan Long MRN: [id number] Date of Birth: 06-Mar-1971 Referring Provider: Dr. Tamala Julian  Encounter Date: 03/30/2016      PT End of Session - 03/30/16 1054    Visit Number 2   Number of Visits 29   Date for PT Re-Evaluation 05/19/16   Authorization Type 30 visit limit --1 used already   PT Start Time 1018   PT Stop Time 1100   PT Time Calculation (min) 42 min   Activity Tolerance Patient tolerated treatment well      Past Medical History:  Diagnosis Date  . Allergic rhinitis   . Chronic migraine    Dr. Catalina Gravel  . Dry eye syndrome of both lacrimal glands   . Endometriosis   . GERD (gastroesophageal reflux disease)   . Headache(784.0)    occasional, dx w/ Migraines before, Topamax helps  . Nuclear cataract of both eyes    Mild  . Sinusitis   . TMJ pain dysfunction syndrome    occasional    Past Surgical History:  Procedure Laterality Date  . BREAST ENHANCEMENT SURGERY  2006  . endrometroisis    . fallopian tube removed     Left    There were no vitals filed for this visit.      Subjective Assessment - 03/30/16 1021    Subjective (P)  Anterior and lateral bilateral ankle pain.  Worse as day goes on.     Currently in Pain? (P)  Yes   Pain Location (P)  Ankle   Pain Orientation (P)  Right;Left   Pain Type (P)  Chronic pain                         [facility] Adult PT Treatment/Exercise - 03/30/16 0001      Iontophoresis   Type of Iontophoresis Dexamethasone   Location #1 bilateral anterior/lateral ankles    Dose 4 mg/ml    Time 4-6 hours patch     Manual Therapy   Manual Therapy Soft tissue mobilization;Passive ROM   Soft tissue mobilization bilateral soft tissue mobilization to bilateral gastroc, plantar fascia, anterior tibialis   Passive ROM gentle gastroc stretch     Ankle Exercises: Stretches   Other Stretch supine HS and gastroc stretch with strap 3x 20 sec bilaterally     Ankle Exercises: Aerobic   Stationary Bike Nu-Step L1 5 min     Ankle Exercises: Seated   Other Seated Ankle Exercises rocker board PF/DF bilateral 30x   Other Seated Ankle Exercises review of previous HEP                PT Education - 03/30/16 1815    Education provided Yes   Education Details review of where to purchase compression socks,  orthotics and importance of movement;  iontophoresis info   Person(s) Educated Patient   Methods Explanation;Demonstration;Handout   Comprehension Verbalized understanding;Returned demonstration          PT Short Term Goals - 03/30/16 1820      PT SHORT TERM GOAL #1   Title The patient will report a good understanding of self care strategies for pain and swelling control including compression socks, orthotics, elevation, use of ice, basic ROM   04/20/16   Time 4   Period Weeks   Status On-going  PT SHORT TERM GOAL #2   Title The patient will report a 25% improvement in bilateral ankle pain and swelling with usual ADLs   Time 4   Period Weeks   Status On-going     PT SHORT TERM GOAL #3   Title The patient will have improved bilateral ankle dorsiflexion to 6 degrees needed for ROM to ascend and descend stairs   Time 4   Period Weeks   Status On-going           PT Long Term Goals - 03/30/16 1820      PT LONG TERM GOAL #1   Title The patient will be independent in safe self progression of HEP for further improvements in ROM and strength  05/19/16   Time 8   Period Weeks   Status On-going     PT LONG TERM GOAL #2   Title The patient will report a 60% improvement in pain and swelling with home and work ADLS   Time 8   Period Weeks   Status On-going     PT LONG TERM GOAL #3   Title The patient will have improved ankle plantarflexion to 50 degrees, dorsiflexion to 8  degrees, eversion to 14 degrees needed for ambulation on uneven surfaces   Time 8   Period Weeks   Status On-going     PT LONG TERM GOAL #4   Title The patient will be able to walk > 1/2 mile for community mobility    Time 8   Period Weeks   Status On-going     PT LONG TERM GOAL #5   Title Ankle strength grossly 4/5 needed for standing/walking longer periods of time   Time 8   Period Weeks   Status On-going     PT LONG TERM GOAL #6   Title FOTO functional outcome score improved from 63% to 42% indicating improved function with less pain   Time 8   Period Weeks   Status On-going               Plan - 03/30/16 1816    Clinical Impression Statement The patient seems fearful of movement in her ankles.  Exercise performed in NWB and at a very low intensity.  Tender points noted in gastrocs, fascia and anterior tibialis.  Mild swelling over lateral malleoli.  Therapist closely monitoring response with all interventions.     PT Next Visit Plan assess response to ionto #1 and low level seated ex;  kinesiotaping as needed;  low level ex including BAPS seated      Patient will benefit from skilled therapeutic intervention in order to improve the following deficits and impairments:     Visit Diagnosis: Pain in right ankle and joints of right foot  Pain in left ankle and joints of left foot  Localized edema  Muscle weakness (generalized)     Problem List Patient Active Problem List   Diagnosis Date Noted  . Peroneal tendinitis of left lower extremity 11/03/2015  . Pes anserine bursitis 11/03/2015  . Asthma with acute exacerbation 06/15/2015  . Acute sinusitis 06/15/2015  . PCP NOTES >>>> 02/23/2015  . GERD (gastroesophageal reflux disease) 01/13/2015  . Myalgia and myositis 07/22/2014  . Paresthesias 06/02/2014  . Acute pharyngitis 05/20/2014  . Strep pharyngitis 05/20/2014  . Degenerative cervical disc 10/28/2013  . Bursitis, shoulder 09/17/2013  . Muscle spasm  of back 09/11/2013  . Nonallopathic lesion of thoracic region 09/11/2013  . General medical examination 03/28/2011  .  Allergic rhinitis   . TMJ PAIN 12/16/2007  . Headache(784.0) 12/16/2007   Ruben Im, PT 03/30/16 6:22 PM Phone: [phone] Fax: [phone]   Alvera Singh 03/30/2016, 6:22 PM  [facility] [address], [place], [place], [address] Phone: [phone]   Fax:  [phone]  Name: Morgan Long MRN: [id number] Date of Birth: 21-Jul-1970

## 2016-03-30 NOTE — Patient Instructions (Signed)

## 2016-03-31 ENCOUNTER — Encounter: Payer: Self-pay | Admitting: Physical Therapy

## 2016-03-31 ENCOUNTER — Ambulatory Visit: Payer: BLUE CROSS/BLUE SHIELD | Admitting: Physical Therapy

## 2016-03-31 DIAGNOSIS — R600 Localized edema: Secondary | ICD-10-CM | POA: Diagnosis not present

## 2016-03-31 DIAGNOSIS — M6281 Muscle weakness (generalized): Secondary | ICD-10-CM

## 2016-03-31 DIAGNOSIS — M25571 Pain in right ankle and joints of right foot: Secondary | ICD-10-CM

## 2016-03-31 DIAGNOSIS — M25572 Pain in left ankle and joints of left foot: Secondary | ICD-10-CM

## 2016-03-31 NOTE — Therapy (Signed)
[facility] [address] [place], [place], [address] Phone: [phone]   Fax:  [phone]  Physical Therapy Treatment  Patient Details  Name: Morgan Long MRN: [id number] Date of Birth: [birth year] Referring Provider: Dr. Tamala Julian  Encounter Date: 03/31/2016      PT End of Session - 03/31/16 1143    Visit Number 3   Number of Visits 29   Date for PT Re-Evaluation 05/19/16   Authorization Type 30 visit limit --1 used already   PT Start Time 1110  Pt 10 minutes late   PT Stop Time 1155   PT Time Calculation (min) 45 min   Activity Tolerance Patient tolerated treatment well      Past Medical History:  Diagnosis Date  . Allergic rhinitis   . Chronic migraine    Dr. Catalina Gravel  . Dry eye syndrome of both lacrimal glands   . Endometriosis   . GERD (gastroesophageal reflux disease)   . Headache(784.0)    occasional, dx w/ Migraines before, Topamax helps  . Nuclear cataract of both eyes    Mild  . Sinusitis   . TMJ pain dysfunction syndrome    occasional    Past Surgical History:  Procedure Laterality Date  . BREAST ENHANCEMENT SURGERY  2006  . endrometroisis    . fallopian tube removed     Left    There were no vitals filed for this visit.      Subjective Assessment - 03/31/16 1117    Subjective Pt reports feeling ok after last therapy session. Thinks ionoto patch helped but did have a lot fo swelling afterwards.    Pertinent History left bunionectomy   Limitations Walking   How long can you walk comfortably? < 1/4 mile   Diagnostic tests x-ray after fall    Patient Stated Goals get back to regular ex regimen, stop swelling;    Currently in Pain? Yes   Pain Location Ankle   Pain Orientation Right;Left   Pain Type Chronic pain   Pain Onset More than a month ago                         [facility] Adult PT Treatment/Exercise - 03/31/16 0001      Exercises   Exercises Ankle     Modalities   Modalities Vasopneumatic     Iontophoresis   Type of Iontophoresis Dexamethasone   Location #2 bilateral anterior/lateral ankles    Dose 4 mg/ml    Time 4-6 hours patch     Vasopneumatic   Number Minutes Vasopneumatic  15 minutes   Vasopnuematic Location  Ankle  both   Vasopneumatic Pressure Medium   Vasopneumatic Temperature  3 flakes     Ankle Exercises: Seated   Other Seated Ankle Exercises rocker board PF/DF bilateral 30x     Ankle Exercises: Supine   Isometrics 4 way Bil isometrics  manual resistance                PT Education - 03/31/16 1151    Education provided Yes   Education Details ionoto   Person(s) Educated Patient   Methods Explanation;Handout   Comprehension Verbalized understanding          PT Short Term Goals - 03/30/16 1820      PT SHORT TERM GOAL #1   Title The patient will report a good understanding of self care strategies for pain and swelling control including compression socks,  orthotics, elevation, use of ice, basic ROM   04/20/16   Time 4   Period Weeks   Status On-going     PT SHORT TERM GOAL #2   Title The patient will report a 25% improvement in bilateral ankle pain and swelling with usual ADLs   Time 4   Period Weeks   Status On-going     PT SHORT TERM GOAL #3   Title The patient will have improved bilateral ankle dorsiflexion to 6 degrees needed for ROM to ascend and descend stairs   Time 4   Period Weeks   Status On-going           PT Long Term Goals - 03/30/16 1820      PT LONG TERM GOAL #1   Title The patient will be independent in safe self progression of HEP for further improvements in ROM and strength  05/19/16   Time 8   Period Weeks   Status On-going     PT LONG TERM GOAL #2   Title The patient will report a 60% improvement in pain and swelling with home and work ADLS   Time 8   Period Weeks   Status On-going     PT LONG TERM GOAL #3   Title The patient will have improved ankle  plantarflexion to 50 degrees, dorsiflexion to 8 degrees, eversion to 14 degrees needed for ambulation on uneven surfaces   Time 8   Period Weeks   Status On-going     PT LONG TERM GOAL #4   Title The patient will be able to walk > 1/2 mile for community mobility    Time 8   Period Weeks   Status On-going     PT LONG TERM GOAL #5   Title Ankle strength grossly 4/5 needed for standing/walking longer periods of time   Time 8   Period Weeks   Status On-going     PT LONG TERM GOAL #6   Title FOTO functional outcome score improved from 63% to 42% indicating improved function with less pain   Time 8   Period Weeks   Status On-going               Plan - 03/31/16 1143    Clinical Impression Statement Pt tolerate last treatment well and believes everything helped but did repotr a lot of swelling afterwards. Pt able to move ankle through all 4 directions well with minal resistance. Some muscle fatigue wth inversion and eversion after about 3 reps. Pt will continue to benefit from skilled therapy for Bil ankle strength and stabliity and pain controll.    Rehab Potential Good   PT Frequency 2x / week   PT Duration 8 weeks   PT Treatment/Interventions ADLs/Self Care Home Management;Cryotherapy;Electrical Stimulation;Iontophoresis 4mg /ml Dexamethasone;Ultrasound;Therapeutic exercise;Patient/family education;Manual techniques;Dry needling;Taping;Vasopneumatic Device   PT Next Visit Plan Ionot #3, low level exercises, modalities as needed      Patient will benefit from skilled therapeutic intervention in order to improve the following deficits and impairments:  Pain, Increased edema, Decreased strength, Decreased range of motion  Visit Diagnosis: Pain in right ankle and joints of right foot  Pain in left ankle and joints of left foot  Localized edema  Muscle weakness (generalized)     Problem List Patient Active Problem List   Diagnosis Date Noted  . Peroneal tendinitis of  left lower extremity 11/03/2015  . Pes anserine bursitis 11/03/2015  . Asthma with acute exacerbation 06/15/2015  . Acute  sinusitis 06/15/2015  . PCP NOTES >>>> 02/23/2015  . GERD (gastroesophageal reflux disease) 01/13/2015  . Myalgia and myositis 07/22/2014  . Paresthesias 06/02/2014  . Acute pharyngitis 05/20/2014  . Strep pharyngitis 05/20/2014  . Degenerative cervical disc 10/28/2013  . Bursitis, shoulder 09/17/2013  . Muscle spasm of back 09/11/2013  . Nonallopathic lesion of thoracic region 09/11/2013  . General medical examination 03/28/2011  . Allergic rhinitis   . TMJ PAIN 12/16/2007  . Headache(784.0) 12/16/2007    Mikle Bosworth PTA 03/31/2016, 11:57 AM  [facility] [address], [place], [place], [address] Phone: [phone]   Fax:  [phone]  Name: Morgan Long MRN: [id number] Date of Birth: 1970/07/23

## 2016-03-31 NOTE — Patient Instructions (Signed)

## 2016-04-04 ENCOUNTER — Ambulatory Visit: Payer: BLUE CROSS/BLUE SHIELD | Admitting: Physical Therapy

## 2016-04-04 DIAGNOSIS — M6281 Muscle weakness (generalized): Secondary | ICD-10-CM

## 2016-04-04 DIAGNOSIS — M25571 Pain in right ankle and joints of right foot: Secondary | ICD-10-CM | POA: Diagnosis not present

## 2016-04-04 DIAGNOSIS — R600 Localized edema: Secondary | ICD-10-CM | POA: Diagnosis not present

## 2016-04-04 DIAGNOSIS — M25572 Pain in left ankle and joints of left foot: Secondary | ICD-10-CM

## 2016-04-04 NOTE — Therapy (Signed)
[facility] [address], [place], [place], [address] Phone: [phone]   Fax:  [phone]  Physical Therapy Treatment  Patient Details  Name: Morgan Long MRN: [id number] Date of Birth: [birth year] Referring Provider: Dr. Tamala Julian  Encounter Date: 04/04/2016      PT End of Session - 04/04/16 0824    Visit Number 4   Number of Visits 29   Date for PT Re-Evaluation 05/19/16   Authorization Type 30 visit limit --1 used already   PT Start Time 0823  Pt arrived 23 min late   PT Stop Time 0904   PT Time Calculation (min) 41 min   Activity Tolerance Patient tolerated treatment well      Past Medical History:  Diagnosis Date  . Allergic rhinitis   . Chronic migraine    Dr. Catalina Gravel  . Dry eye syndrome of both lacrimal glands   . Endometriosis   . GERD (gastroesophageal reflux disease)   . Headache(784.0)    occasional, dx w/ Migraines before, Topamax helps  . Nuclear cataract of both eyes    Mild  . Sinusitis   . TMJ pain dysfunction syndrome    occasional    Past Surgical History:  Procedure Laterality Date  . BREAST ENHANCEMENT SURGERY  2006  . endrometroisis    . fallopian tube removed     Left    There were no vitals filed for this visit.      Subjective Assessment - 04/04/16 0826    Subjective Pt reports doing ok after last session. Reports vaso helped to reduce swelling teh rest of the day. Also responded well to ionto patch.    Pertinent History left bunionectomy   Limitations Walking   How long can you walk comfortably? < 1/4 mile   Diagnostic tests x-ray after fall    Patient Stated Goals get back to regular ex regimen, stop swelling;    Currently in Pain? Yes   Pain Score 5    Pain Location Ankle   Pain Orientation Right;Left   Pain Type Chronic pain   Pain Onset More than a month ago   Pain Frequency Constant                         [facility] Adult PT  Treatment/Exercise - 04/04/16 0001      Exercises   Exercises Ankle     Modalities   Modalities Vasopneumatic     Iontophoresis   Type of Iontophoresis Dexamethasone   Location #3 bilateral anterior/lateral ankles    Dose 4 mg/ml    Time 4-6 hours patch     Vasopneumatic   Number Minutes Vasopneumatic  15 minutes   Vasopnuematic Location  Ankle  both   Vasopneumatic Pressure Medium   Vasopneumatic Temperature  3 flakes     Ankle Exercises: Aerobic   Stationary Bike Nu-Step L1 5 min     Ankle Exercises: Seated   Other Seated Ankle Exercises rocker board PF/DF bilateral 30x     Ankle Exercises: Supine   Isometrics 4 way Bil isometrics  manual resistance                  PT Short Term Goals - 04/04/16 0845      PT SHORT TERM GOAL #1   Title The patient will report a good understanding of self care strategies for pain and swelling control including compression socks, orthotics, elevation, use of ice,  basic ROM   04/20/16   Time 4   Period Weeks   Status On-going     PT SHORT TERM GOAL #2   Title The patient will report a 25% improvement in bilateral ankle pain and swelling with usual ADLs   Time 4   Period Weeks   Status On-going     PT SHORT TERM GOAL #3   Title The patient will have improved bilateral ankle dorsiflexion to 6 degrees needed for ROM to ascend and descend stairs   Time 4   Period Weeks   Status On-going           PT Long Term Goals - 04/04/16 0845      PT LONG TERM GOAL #1   Title The patient will be independent in safe self progression of HEP for further improvements in ROM and strength  05/19/16   Time 8   Period Weeks   Status On-going     PT LONG TERM GOAL #2   Title The patient will report a 60% improvement in pain and swelling with home and work ADLS   Time 8   Period Weeks   Status On-going     PT LONG TERM GOAL #3   Title The patient will have improved ankle plantarflexion to 50 degrees, dorsiflexion to 8 degrees,  eversion to 14 degrees needed for ambulation on uneven surfaces   Time 8   Period Weeks   Status On-going     PT LONG TERM GOAL #4   Title The patient will be able to walk > 1/2 mile for community mobility    Time 8   Period Weeks   Status On-going     PT LONG TERM GOAL #5   Title Ankle strength grossly 4/5 needed for standing/walking longer periods of time   Time 8   Period Weeks   Status On-going     PT LONG TERM GOAL #6   Title FOTO functional outcome score improved from 63% to 42% indicating improved function with less pain   Time 8   Period Weeks   Status On-going               Plan - 04/04/16 0825    Clinical Impression Statement Pt continues to have increased pain in Bil ankles with movement. Able to complete 2 sets of manually resisted ankle exercises today. Pt responded very well to vasocompression and ionto. Pt will continue to benefit from skilled therapy for Bil ankle strenght and stability.    Rehab Potential Good   PT Frequency 2x / week   PT Duration 8 weeks   PT Treatment/Interventions ADLs/Self Care Home Management;Cryotherapy;Electrical Stimulation;Iontophoresis 4mg /ml Dexamethasone;Ultrasound;Therapeutic exercise;Patient/family education;Manual techniques;Dry needling;Taping;Vasopneumatic Device   PT Next Visit Plan Ionot #4, low level exercises, modalities as needed      Patient will benefit from skilled therapeutic intervention in order to improve the following deficits and impairments:  Pain, Increased edema, Decreased strength, Decreased range of motion  Visit Diagnosis: Pain in right ankle and joints of right foot  Pain in left ankle and joints of left foot  Localized edema  Muscle weakness (generalized)     Problem List Patient Active Problem List   Diagnosis Date Noted  . Peroneal tendinitis of left lower extremity 11/03/2015  . Pes anserine bursitis 11/03/2015  . Asthma with acute exacerbation 06/15/2015  . Acute sinusitis  06/15/2015  . PCP NOTES >>>> 02/23/2015  . GERD (gastroesophageal reflux disease) 01/13/2015  . Myalgia and  myositis 07/22/2014  . Paresthesias 06/02/2014  . Acute pharyngitis 05/20/2014  . Strep pharyngitis 05/20/2014  . Degenerative cervical disc 10/28/2013  . Bursitis, shoulder 09/17/2013  . Muscle spasm of back 09/11/2013  . Nonallopathic lesion of thoracic region 09/11/2013  . General medical examination 03/28/2011  . Allergic rhinitis   . TMJ PAIN 12/16/2007  . Headache(784.0) 12/16/2007    Mikle Bosworth PTA 04/04/2016, 11:07 AM  [facility] [address] [place], [place], [address] Phone: [phone]   Fax:  [phone]  Name: Morgan Long MRN: [id number] Date of Birth: 04-Jul-1970

## 2016-04-07 ENCOUNTER — Ambulatory Visit: Payer: BLUE CROSS/BLUE SHIELD | Attending: Family Medicine

## 2016-04-07 DIAGNOSIS — R600 Localized edema: Secondary | ICD-10-CM | POA: Diagnosis not present

## 2016-04-07 DIAGNOSIS — M6281 Muscle weakness (generalized): Secondary | ICD-10-CM | POA: Diagnosis not present

## 2016-04-07 DIAGNOSIS — M25572 Pain in left ankle and joints of left foot: Secondary | ICD-10-CM

## 2016-04-07 DIAGNOSIS — M25571 Pain in right ankle and joints of right foot: Secondary | ICD-10-CM | POA: Diagnosis not present

## 2016-04-07 NOTE — Therapy (Signed)
[facility] [address] [place], [place], [address] Phone: [phone]   Fax:  [phone]  Physical Therapy Treatment  Patient Details  Name: Morgan Long MRN: [id number] Date of Birth: 24-Dec-1970 Referring Provider: Dr. Tamala Julian  Encounter Date: 04/07/2016      PT End of Session - 04/07/16 1116    Visit Number 5   Number of Visits 29   Date for PT Re-Evaluation 05/19/16   Authorization Type 30 visit limit --1 used already   PT Start Time 1106   PT Stop Time 1145   PT Time Calculation (min) 39 min   Activity Tolerance Patient tolerated treatment well      Past Medical History:  Diagnosis Date  . Allergic rhinitis   . Chronic migraine    Dr. Catalina Gravel  . Dry eye syndrome of both lacrimal glands   . Endometriosis   . GERD (gastroesophageal reflux disease)   . Headache(784.0)    occasional, dx w/ Migraines before, Topamax helps  . Nuclear cataract of both eyes    Mild  . Sinusitis   . TMJ pain dysfunction syndrome    occasional    Past Surgical History:  Procedure Laterality Date  . BREAST ENHANCEMENT SURGERY  2006  . endrometroisis    . fallopian tube removed     Left    There were no vitals filed for this visit.      Subjective Assessment - 04/07/16 1111    Subjective Pt. reporting she was inflamed at L ankle following last treatment.     Patient Stated Goals get back to regular ex regimen, stop swelling;    Currently in Pain? Yes   Pain Score 8    Pain Location Ankle   Pain Orientation Right;Left   Pain Type Chronic pain   Pain Onset More than a month ago   Pain Frequency Constant            [facility] Adult PT Treatment/Exercise - 04/07/16 1150      Iontophoresis   Type of Iontophoresis Dexamethasone   Location #4 bilateral anterior/lateral ankles    Dose 4 mg/ml    Time 4-6 hours patch     Vasopneumatic   Number Minutes Vasopneumatic  15 minutes   Vasopnuematic Location  Ankle   both   Vasopneumatic Pressure Medium   Vasopneumatic Temperature  3 flakes      Ankle Exercises: Stretches   Other Stretch supine B gastroc. stretch 2 x 30 sec each     Ankle Exercises: Standing   Other Standing Ankle Exercises Standing side<>side st. shift (with foot clearance) x 20 reps each way; terminated followiung 20 reps due to increased L ankle pain   Other Standing Ankle Exercises Standing alternating wt. shift (without LE clearance) x 20 reps each way; 2 pole support; terminated due to increased B ankle pain     Ankle Exercises: Supine   Isometrics 4-directional ankle strengthening with red TB x 15 reps     Ankle Exercises: Aerobic   Stationary Bike Nu-Step L2 5 min            PT Short Term Goals - 04/04/16 0845      PT SHORT TERM GOAL #1   Title The patient will report a good understanding of self care strategies for pain and swelling control including compression socks, orthotics, elevation, use of ice, basic ROM   04/20/16   Time 4   Period Weeks   Status  On-going     PT SHORT TERM GOAL #2   Title The patient will report a 25% improvement in bilateral ankle pain and swelling with usual ADLs   Time 4   Period Weeks   Status On-going     PT SHORT TERM GOAL #3   Title The patient will have improved bilateral ankle dorsiflexion to 6 degrees needed for ROM to ascend and descend stairs   Time 4   Period Weeks   Status On-going           PT Long Term Goals - 04/04/16 0845      PT LONG TERM GOAL #1   Title The patient will be independent in safe self progression of HEP for further improvements in ROM and strength  05/19/16   Time 8   Period Weeks   Status On-going     PT LONG TERM GOAL #2   Title The patient will report a 60% improvement in pain and swelling with home and work ADLS   Time 8   Period Weeks   Status On-going     PT LONG TERM GOAL #3   Title The patient will have improved ankle plantarflexion to 50 degrees, dorsiflexion to 8 degrees,  eversion to 14 degrees needed for ambulation on uneven surfaces   Time 8   Period Weeks   Status On-going     PT LONG TERM GOAL #4   Title The patient will be able to walk > 1/2 mile for community mobility    Time 8   Period Weeks   Status On-going     PT LONG TERM GOAL #5   Title Ankle strength grossly 4/5 needed for standing/walking longer periods of time   Time 8   Period Weeks   Status On-going     PT LONG TERM GOAL #6   Title FOTO functional outcome score improved from 63% to 42% indicating improved function with less pain   Time 8   Period Weeks   Status On-going               Plan - 04/07/16 1117    Clinical Impression Statement  Pt. with reporting increased swelling following last treatment and increased pain initially today.  Today's treatment focused on standing wt. shifting to pt. tolerance with continue conservative supine ankle strengthening.  Pt. reporting benefit from ionto patch 3#/6 applied last treatment thus ionto patch #4/6 applied to B lateral ankle today.  Ice/compression to B ankle continued today and will plan to progress conservative strengthening and stretching to B ankles per pt. tolerance.   PT Treatment/Interventions ADLs/Self Care Home Management;Cryotherapy;Electrical Stimulation;Iontophoresis 4mg /ml Dexamethasone;Ultrasound;Therapeutic exercise;Patient/family education;Manual techniques;Dry needling;Taping;Vasopneumatic Device   PT Next Visit Plan Ionot #5, low level exercises, modalities as needed      Patient will benefit from skilled therapeutic intervention in order to improve the following deficits and impairments:  Pain, Increased edema, Decreased strength, Decreased range of motion  Visit Diagnosis: Pain in right ankle and joints of right foot  Pain in left ankle and joints of left foot     Problem List Patient Active Problem List   Diagnosis Date Noted  . Peroneal tendinitis of left lower extremity 11/03/2015  . Pes anserine  bursitis 11/03/2015  . Asthma with acute exacerbation 06/15/2015  . Acute sinusitis 06/15/2015  . PCP NOTES >>>> 02/23/2015  . GERD (gastroesophageal reflux disease) 01/13/2015  . Myalgia and myositis 07/22/2014  . Paresthesias 06/02/2014  . Acute pharyngitis 05/20/2014  .  Strep pharyngitis 05/20/2014  . Degenerative cervical disc 10/28/2013  . Bursitis, shoulder 09/17/2013  . Muscle spasm of back 09/11/2013  . Nonallopathic lesion of thoracic region 09/11/2013  . General medical examination 03/28/2011  . Allergic rhinitis   . TMJ PAIN 12/16/2007  . Headache(784.0) 12/16/2007    Bess Harvest, PTA 04/07/16 12:06 PM  [facility] [address] [place], [place], [address] Phone: [phone]   Fax:  [phone]  Name: Morgan Long MRN: [id number] Date of Birth: 05/01/1971

## 2016-04-10 ENCOUNTER — Encounter: Payer: BLUE CROSS/BLUE SHIELD | Admitting: Physical Therapy

## 2016-04-11 ENCOUNTER — Ambulatory Visit: Payer: BLUE CROSS/BLUE SHIELD | Admitting: Physical Therapy

## 2016-04-11 DIAGNOSIS — R600 Localized edema: Secondary | ICD-10-CM | POA: Diagnosis not present

## 2016-04-11 DIAGNOSIS — M25571 Pain in right ankle and joints of right foot: Secondary | ICD-10-CM | POA: Diagnosis not present

## 2016-04-11 DIAGNOSIS — M6281 Muscle weakness (generalized): Secondary | ICD-10-CM | POA: Diagnosis not present

## 2016-04-11 DIAGNOSIS — M25572 Pain in left ankle and joints of left foot: Secondary | ICD-10-CM

## 2016-04-11 NOTE — Therapy (Signed)
[facility] [address], [place], [place], [address] Phone: [phone]   Fax:  [phone]  Physical Therapy Treatment  Patient Details  Name: Morgan Long MRN: [id number] Date of Birth: Jun 06, 1970 Referring Provider: Dr. Tamala Julian  Encounter Date: 04/11/2016      PT End of Session - 04/11/16 0846    Visit Number 6   Number of Visits 29   Date for PT Re-Evaluation 05/19/16   Authorization Type 30 visit limit --1 used already   PT Start Time 0730   PT Stop Time 0806   PT Time Calculation (min) 36 min   Activity Tolerance Patient limited by pain      Past Medical History:  Diagnosis Date  . Allergic rhinitis   . Chronic migraine    Dr. Catalina Gravel  . Dry eye syndrome of both lacrimal glands   . Endometriosis   . GERD (gastroesophageal reflux disease)   . Headache(784.0)    occasional, dx w/ Migraines before, Topamax helps  . Nuclear cataract of both eyes    Mild  . Sinusitis   . TMJ pain dysfunction syndrome    occasional    Past Surgical History:  Procedure Laterality Date  . BREAST ENHANCEMENT SURGERY  2006  . endrometroisis    . fallopian tube removed     Left    There were no vitals filed for this visit.      Subjective Assessment - 04/11/16 0734    Subjective Patient states she is sore from exercises.  Patient states she needs to leave early today b/c of work.  Reports, "I'm concerned that it's not better yet."  Patient has not gotten orthotics or compression socks yet.     Currently in Pain? Yes   Pain Score 5    Pain Location Ankle   Pain Orientation Right;Left                         [facility] Adult PT Treatment/Exercise - 04/11/16 0001      Iontophoresis   Type of Iontophoresis Dexamethasone   Location # 5 bilateral anterior   Dose 4 mg/ml    Time 4-6 hours patch     Manual Therapy   Manual Therapy Joint mobilization;Taping   Joint Mobilization talocural; metatarsal  and calcaneal mobs grade 2/3 3x 30 sec right and left   Passive ROM gentle gastroc stretch   Kinesiotex Facilitate Muscle     Kinesiotix   Facilitate Muscle  medial to lateral ankle bilaterally     Ankle Exercises: Aerobic   Stationary Bike Nu-Step L2 5 min     Ankle Exercises: Standing   SLS 4 ways 10x right and left    Other Standing Ankle Exercises weight shifting in 4 directions 10x   Other Standing Ankle Exercises standing rocker board 3 min                  PT Short Term Goals - 04/11/16 1644      PT SHORT TERM GOAL #1   Title The patient will report a good understanding of self care strategies for pain and swelling control including compression socks, orthotics, elevation, use of ice, basic ROM   04/20/16   Time 4   Period Weeks   Status On-going     PT SHORT TERM GOAL #2   Title The patient will report a 25% improvement in bilateral ankle pain and swelling with usual ADLs  Time 4   Period Weeks   Status On-going     PT SHORT TERM GOAL #3   Title The patient will have improved bilateral ankle dorsiflexion to 6 degrees needed for ROM to ascend and descend stairs   Time 4   Period Weeks   Status On-going           PT Long Term Goals - 04/11/16 1645      PT LONG TERM GOAL #1   Title The patient will be independent in safe self progression of HEP for further improvements in ROM and strength  05/19/16   Time 8   Period Weeks   Status On-going     PT LONG TERM GOAL #2   Title The patient will report a 60% improvement in pain and swelling with home and work ADLS   Period Weeks   Status On-going     PT LONG TERM GOAL #3   Title The patient will have improved ankle plantarflexion to 50 degrees, dorsiflexion to 8 degrees, eversion to 14 degrees needed for ambulation on uneven surfaces   Time 8   Period Weeks   Status On-going     PT LONG TERM GOAL #4   Title The patient will be able to walk > 1/2 mile for community mobility    Time 8   Period  Weeks   Status On-going     PT LONG TERM GOAL #5   Title Ankle strength grossly 4/5 needed for standing/walking longer periods of time   Time 8   Period Weeks   Status On-going     PT LONG TERM GOAL #6   Title FOTO functional outcome score improved from 63% to 42% indicating improved function with less pain   Time 8   Period Weeks   Status On-going               Plan - 04/11/16 1234    Clinical Impression Statement The patient continues to be concerned about continued pain and dorsal and lateral ankle swelling.  She reports short term relief with current interventions but is unsure of longer term benefits.  Patient grimaces and complains of pain with PROM, AAROM and AROM.   Discussed with patient that if no progress toward goals in next 7-10 days, will discontinue PT and refer back to MD for follow up.  Patient needs to leave early secondary to work obligations.   PT Next Visit Plan last ionto;  check response to taping;  low level ex;  vasocompression for pain and edema control      Patient will benefit from skilled therapeutic intervention in order to improve the following deficits and impairments:     Visit Diagnosis: Pain in right ankle and joints of right foot  Pain in left ankle and joints of left foot  Localized edema  Muscle weakness (generalized)     Problem List Patient Active Problem List   Diagnosis Date Noted  . Peroneal tendinitis of left lower extremity 11/03/2015  . Pes anserine bursitis 11/03/2015  . Asthma with acute exacerbation 06/15/2015  . Acute sinusitis 06/15/2015  . PCP NOTES >>>> 02/23/2015  . GERD (gastroesophageal reflux disease) 01/13/2015  . Myalgia and myositis 07/22/2014  . Paresthesias 06/02/2014  . Acute pharyngitis 05/20/2014  . Strep pharyngitis 05/20/2014  . Degenerative cervical disc 10/28/2013  . Bursitis, shoulder 09/17/2013  . Muscle spasm of back 09/11/2013  . Nonallopathic lesion of thoracic region 09/11/2013  .  General medical examination 03/28/2011  .  Allergic rhinitis   . TMJ PAIN 12/16/2007  . Headache(784.0) 12/16/2007   Ruben Im, PT 04/11/16 4:48 PM Phone: [phone] Fax: [phone]  Alvera Singh 04/11/2016, 4:47 PM  [facility] [address] [place], [place], [address] Phone: [phone]   Fax:  [phone]  Name: SHIRLETTA SARSFIELD MRN: [id number] Date of Birth: Apr 23, 1971

## 2016-04-14 ENCOUNTER — Encounter: Payer: Self-pay | Admitting: Physical Therapy

## 2016-04-14 ENCOUNTER — Ambulatory Visit: Payer: BLUE CROSS/BLUE SHIELD | Admitting: Physical Therapy

## 2016-04-14 DIAGNOSIS — R600 Localized edema: Secondary | ICD-10-CM

## 2016-04-14 DIAGNOSIS — M6281 Muscle weakness (generalized): Secondary | ICD-10-CM

## 2016-04-14 DIAGNOSIS — M25572 Pain in left ankle and joints of left foot: Secondary | ICD-10-CM

## 2016-04-14 DIAGNOSIS — M25571 Pain in right ankle and joints of right foot: Secondary | ICD-10-CM

## 2016-04-14 NOTE — Therapy (Signed)
[facility] [address] [place], [place], [address] Phone: [phone]   Fax:  [phone]  Physical Therapy Treatment  Patient Details  Name: Morgan Long MRN: [id number] Date of Birth: [birth year] Referring Provider: Dr. Tamala Julian  Encounter Date: 04/14/2016      PT End of Session - 04/14/16 1156    Visit Number 7   Number of Visits 29   Date for PT Re-Evaluation 05/19/16   Authorization Type 30 visit limit --1 used already   PT Start Time 1112   PT Stop Time 1208   PT Time Calculation (min) 56 min   Activity Tolerance Patient limited by pain      Past Medical History:  Diagnosis Date  . Allergic rhinitis   . Chronic migraine    Dr. Catalina Gravel  . Dry eye syndrome of both lacrimal glands   . Endometriosis   . GERD (gastroesophageal reflux disease)   . Headache(784.0)    occasional, dx w/ Migraines before, Topamax helps  . Nuclear cataract of both eyes    Mild  . Sinusitis   . TMJ pain dysfunction syndrome    occasional    Past Surgical History:  Procedure Laterality Date  . BREAST ENHANCEMENT SURGERY  2006  . endrometroisis    . fallopian tube removed     Left    There were no vitals filed for this visit.      Subjective Assessment - 04/14/16 1116    Subjective Pt reports some tightness in Bil ankles. Does feeling like she is making progress though.    Pertinent History left bunionectomy   Limitations Walking   How long can you walk comfortably? < 1/4 mile   Diagnostic tests x-ray after fall    Patient Stated Goals get back to regular ex regimen, stop swelling;    Currently in Pain? Yes   Pain Score 7    Pain Location Ankle   Pain Orientation Right;Left   Pain Type Chronic pain   Pain Onset More than a month ago   Pain Frequency Constant                         [facility] Adult PT Treatment/Exercise - 04/14/16 0001      Iontophoresis   Type of Iontophoresis Dexamethasone    Location # 6 bilateral anterior   Dose 4 mg/ml    Time 4-6 hours patch     Vasopneumatic   Number Minutes Vasopneumatic  15 minutes   Vasopnuematic Location  Ankle  both   Vasopneumatic Pressure Medium   Vasopneumatic Temperature  3 flakes      Manual Therapy   Manual Therapy Soft tissue mobilization   Soft tissue mobilization Bil acheilies tendon and lateral ligaments   Passive ROM gentle gastroc stretch     Ankle Exercises: Standing   SLS 20 seconds Bil x2   Heel Raises --  attempted but painful   Toe Raise 10 reps   Other Standing Ankle Exercises weight shifting in 4 directions 10x  on blue foam   Other Standing Ankle Exercises standing rocker board 3 min                  PT Short Term Goals - 04/11/16 1644      PT SHORT TERM GOAL #1   Title The patient will report a good understanding of self care strategies for pain and swelling control including compression socks, orthotics,  elevation, use of ice, basic ROM   04/20/16   Time 4   Period Weeks   Status On-going     PT SHORT TERM GOAL #2   Title The patient will report a 25% improvement in bilateral ankle pain and swelling with usual ADLs   Time 4   Period Weeks   Status On-going     PT SHORT TERM GOAL #3   Title The patient will have improved bilateral ankle dorsiflexion to 6 degrees needed for ROM to ascend and descend stairs   Time 4   Period Weeks   Status On-going           PT Long Term Goals - 04/11/16 1645      PT LONG TERM GOAL #1   Title The patient will be independent in safe self progression of HEP for further improvements in ROM and strength  05/19/16   Time 8   Period Weeks   Status On-going     PT LONG TERM GOAL #2   Title The patient will report a 60% improvement in pain and swelling with home and work ADLS   Period Weeks   Status On-going     PT LONG TERM GOAL #3   Title The patient will have improved ankle plantarflexion to 50 degrees, dorsiflexion to 8 degrees,  eversion to 14 degrees needed for ambulation on uneven surfaces   Time 8   Period Weeks   Status On-going     PT LONG TERM GOAL #4   Title The patient will be able to walk > 1/2 mile for community mobility    Time 8   Period Weeks   Status On-going     PT LONG TERM GOAL #5   Title Ankle strength grossly 4/5 needed for standing/walking longer periods of time   Time 8   Period Weeks   Status On-going     PT LONG TERM GOAL #6   Title FOTO functional outcome score improved from 63% to 42% indicating improved function with less pain   Time 8   Period Weeks   Status On-going               Plan - 04/14/16 1208    Clinical Impression Statement Pt reports she feels that she is making some progress. Has been compliant with home exercises but continues to have swelling and pain. Able to tolerate all strenghtening well. Some tenderness with manual massage. Pt will continue to benefit from skilled therapy for ankle strengthening and stability.    Rehab Potential Good   PT Frequency 2x / week   PT Duration 8 weeks   PT Treatment/Interventions ADLs/Self Care Home Management;Cryotherapy;Electrical Stimulation;Iontophoresis 4mg /ml Dexamethasone;Ultrasound;Therapeutic exercise;Patient/family education;Manual techniques;Dry needling;Taping;Vasopneumatic Device   PT Next Visit Plan Continue strenghtening and stabilization, vasocompressionfor swelling      Patient will benefit from skilled therapeutic intervention in order to improve the following deficits and impairments:  Pain, Increased edema, Decreased strength, Decreased range of motion  Visit Diagnosis: Pain in left ankle and joints of left foot  Pain in right ankle and joints of right foot  Localized edema  Muscle weakness (generalized)     Problem List Patient Active Problem List   Diagnosis Date Noted  . Peroneal tendinitis of left lower extremity 11/03/2015  . Pes anserine bursitis 11/03/2015  . Asthma with acute  exacerbation 06/15/2015  . Acute sinusitis 06/15/2015  . PCP NOTES >>>> 02/23/2015  . GERD (gastroesophageal reflux disease) 01/13/2015  . Myalgia  and myositis 07/22/2014  . Paresthesias 06/02/2014  . Acute pharyngitis 05/20/2014  . Strep pharyngitis 05/20/2014  . Degenerative cervical disc 10/28/2013  . Bursitis, shoulder 09/17/2013  . Muscle spasm of back 09/11/2013  . Nonallopathic lesion of thoracic region 09/11/2013  . General medical examination 03/28/2011  . Allergic rhinitis   . TMJ PAIN 12/16/2007  . Headache(784.0) 12/16/2007    Mikle Bosworth PTA 04/14/2016, 12:10 PM  [facility] [address], [place], [place], [address] Phone: [phone]   Fax:  [phone]  Name: AVARY BESSIRE MRN: [id number] Date of Birth: 1970/09/02

## 2016-04-16 ENCOUNTER — Other Ambulatory Visit: Payer: Self-pay | Admitting: Internal Medicine

## 2016-04-18 ENCOUNTER — Encounter: Payer: BLUE CROSS/BLUE SHIELD | Admitting: Physical Therapy

## 2016-04-20 ENCOUNTER — Encounter: Payer: BLUE CROSS/BLUE SHIELD | Admitting: Physical Therapy

## 2016-04-21 ENCOUNTER — Encounter: Payer: BLUE CROSS/BLUE SHIELD | Admitting: Physical Therapy

## 2016-04-21 DIAGNOSIS — N760 Acute vaginitis: Secondary | ICD-10-CM | POA: Diagnosis not present

## 2016-04-21 DIAGNOSIS — N951 Menopausal and female climacteric states: Secondary | ICD-10-CM | POA: Diagnosis not present

## 2016-04-21 DIAGNOSIS — Z01411 Encounter for gynecological examination (general) (routine) with abnormal findings: Secondary | ICD-10-CM | POA: Diagnosis not present

## 2016-04-21 DIAGNOSIS — E669 Obesity, unspecified: Secondary | ICD-10-CM | POA: Diagnosis not present

## 2016-04-21 DIAGNOSIS — Z01419 Encounter for gynecological examination (general) (routine) without abnormal findings: Secondary | ICD-10-CM | POA: Diagnosis not present

## 2016-04-21 DIAGNOSIS — N390 Urinary tract infection, site not specified: Secondary | ICD-10-CM | POA: Diagnosis not present

## 2016-04-21 DIAGNOSIS — Z6835 Body mass index (BMI) 35.0-35.9, adult: Secondary | ICD-10-CM | POA: Diagnosis not present

## 2016-04-25 ENCOUNTER — Encounter: Payer: BLUE CROSS/BLUE SHIELD | Admitting: Physical Therapy

## 2016-05-01 ENCOUNTER — Telehealth: Payer: Self-pay | Admitting: Family Medicine

## 2016-05-01 NOTE — Telephone Encounter (Signed)
Patient states that she sent a fax to you requesting a letter to reduce her schedule at work due to her ankle. Advised no notes at this point.

## 2016-05-02 ENCOUNTER — Encounter: Payer: Self-pay | Admitting: *Deleted

## 2016-05-02 ENCOUNTER — Encounter: Payer: Self-pay | Admitting: Physical Therapy

## 2016-05-02 ENCOUNTER — Ambulatory Visit: Payer: BLUE CROSS/BLUE SHIELD | Admitting: Physical Therapy

## 2016-05-02 ENCOUNTER — Encounter: Payer: Self-pay | Admitting: Family Medicine

## 2016-05-02 DIAGNOSIS — R600 Localized edema: Secondary | ICD-10-CM | POA: Diagnosis not present

## 2016-05-02 DIAGNOSIS — M25571 Pain in right ankle and joints of right foot: Secondary | ICD-10-CM

## 2016-05-02 DIAGNOSIS — M6281 Muscle weakness (generalized): Secondary | ICD-10-CM

## 2016-05-02 DIAGNOSIS — M25572 Pain in left ankle and joints of left foot: Secondary | ICD-10-CM

## 2016-05-02 NOTE — Therapy (Signed)
[facility] [address] [place], [place], [address] Phone: [phone]   Fax:  [phone]  Physical Therapy Treatment  Patient Details  Name: Morgan Long MRN: [id number] Date of Birth: 18-Sep-1970 Referring Provider: Dr. Tamala Julian  Encounter Date: 05/02/2016      PT End of Session - 05/02/16 1527    Visit Number 8   Number of Visits 29   Date for PT Re-Evaluation 05/19/16   Authorization Type 30 visit limit --1 used already   PT Start Time 1450   PT Stop Time 1543   PT Time Calculation (min) 53 min   Activity Tolerance Patient limited by pain      Past Medical History:  Diagnosis Date  . Allergic rhinitis   . Chronic migraine    Dr. Catalina Gravel  . Dry eye syndrome of both lacrimal glands   . Endometriosis   . GERD (gastroesophageal reflux disease)   . Headache(784.0)    occasional, dx w/ Migraines before, Topamax helps  . Nuclear cataract of both eyes    Mild  . Sinusitis   . TMJ pain dysfunction syndrome    occasional    Past Surgical History:  Procedure Laterality Date  . BREAST ENHANCEMENT SURGERY  2006  . endrometroisis    . fallopian tube removed     Left    There were no vitals filed for this visit.      Subjective Assessment - 05/02/16 1453    Subjective Pt feels like she had two days that were good after previous treatment then had swelling   Pertinent History left bunionectomy   Limitations Walking   How long can you walk comfortably? 20-30 minutes   Currently in Pain? Yes   Pain Score 8   Rt is 6   Pain Location Ankle   Pain Orientation Right;Left   Pain Type Chronic pain   Pain Onset More than a month ago   Pain Frequency Constant   Aggravating Factors  walking standing sitting long periods   Pain Relieving Factors elevation,    Multiple Pain Sites No                         [facility] Adult PT Treatment/Exercise - 05/02/16 0001      Vasopneumatic   Number  Minutes Vasopneumatic  15 minutes   Vasopnuematic Location  Ankle  both   Vasopneumatic Pressure Medium   Vasopneumatic Temperature  3 flakes      Manual Therapy   Manual Therapy Soft tissue mobilization   Soft tissue mobilization Bil acheilies tendon and lateral ligaments   Passive ROM gentle gastroc stretch     Ankle Exercises: Standing   SLS 20 seconds Bil x2   Toe Raise 10 reps   Other Standing Ankle Exercises weight shifting in 4 directions 10x  on blue foam   Other Standing Ankle Exercises --     Ankle Exercises: Seated   BAPS Sitting;Level 2  front to back and side to side, 2 min each                PT Education - 05/02/16 1526    Education provided Yes   Education Details educated to do self massage for sensory input to desensatize ankle/foot   Person(s) Educated Patient   Methods Explanation;Demonstration   Comprehension Verbalized understanding          PT Short Term Goals - 05/02/16 1528  PT SHORT TERM GOAL #1   Title The patient will report a good understanding of self care strategies for pain and swelling control including compression socks, orthotics, elevation, use of ice, basic ROM   04/20/16   Time 4   Period Weeks   Status On-going     PT SHORT TERM GOAL #2   Title The patient will report a 25% improvement in bilateral ankle pain and swelling with usual ADLs   Time 4   Period Weeks   Status On-going           PT Long Term Goals - 04/11/16 1645      PT LONG TERM GOAL #1   Title The patient will be independent in safe self progression of HEP for further improvements in ROM and strength  05/19/16   Time 8   Period Weeks   Status On-going     PT LONG TERM GOAL #2   Title The patient will report a 60% improvement in pain and swelling with home and work ADLS   Period Weeks   Status On-going     PT [place] #3   Title The patient will have improved ankle plantarflexion to 50 degrees, dorsiflexion to 8 degrees, eversion  to 14 degrees needed for ambulation on uneven surfaces   Time 8   Period Weeks   Status On-going     PT LONG TERM GOAL #4   Title The patient will be able to walk > 1/2 mile for community mobility    Time 8   Period Weeks   Status On-going     PT LONG TERM GOAL #5   Title Ankle strength grossly 4/5 needed for standing/walking longer periods of time   Time 8   Period Weeks   Status On-going     PT LONG TERM GOAL #6   Title FOTO functional outcome score improved from 63% to 42% indicating improved function with less pain   Time 8   Period Weeks   Status On-going               Plan - 05/02/16 1532    Clinical Impression Statement Pt reports she feels slightly better but she is very limited and reports more pain on Lt ankle.  Pt has low tolerance for any weight or pressure during manual treatment.  Pt has difficulty controlling BAPS board and was having significantly increased pain when weight shifting onto Lt foot today.  Pt expressed concern that there is "something wrong" that may not show up on the x-ray.  Pt needed a lot of encouragement and explanation in the healing process to ease some of her worries about her ankle.  Skilled PT is needed to assist patient in progressing patient through exercise progression   Rehab Potential Good   PT Frequency 2x / week   PT Duration 8 weeks   PT Treatment/Interventions ADLs/Self Care Home Management;Cryotherapy;Electrical Stimulation;Iontophoresis 4mg /ml Dexamethasone;Ultrasound;Therapeutic exercise;Patient/family education;Manual techniques;Dry needling;Taping;Vasopneumatic Device   PT Next Visit Plan Continue strenghtening and stabilization, vasocompressionfor swelling   Consulted and Agree with Plan of Care Patient      Patient will benefit from skilled therapeutic intervention in order to improve the following deficits and impairments:  Pain, Increased edema, Decreased strength, Decreased range of motion  Visit Diagnosis: Pain  in left ankle and joints of left foot  Pain in right ankle and joints of right foot  Localized edema  Muscle weakness (generalized)     Problem List Patient  Active Problem List   Diagnosis Date Noted  . Peroneal tendinitis of left lower extremity 11/03/2015  . Pes anserine bursitis 11/03/2015  . Asthma with acute exacerbation 06/15/2015  . Acute sinusitis 06/15/2015  . PCP NOTES >>>> 02/23/2015  . GERD (gastroesophageal reflux disease) 01/13/2015  . Myalgia and myositis 07/22/2014  . Paresthesias 06/02/2014  . Acute pharyngitis 05/20/2014  . Strep pharyngitis 05/20/2014  . Degenerative cervical disc 10/28/2013  . Bursitis, shoulder 09/17/2013  . Muscle spasm of back 09/11/2013  . Nonallopathic lesion of thoracic region 09/11/2013  . General medical examination 03/28/2011  . Allergic rhinitis   . TMJ PAIN 12/16/2007  . Headache(784.0) 12/16/2007    Zannie Cove, PT 05/02/2016, 5:34 PM  [facility] [address] [place], [place], [address] Phone: [phone]   Fax:  [phone]  Name: Morgan Long MRN: [id number] Date of Birth: 02/10/1971

## 2016-05-02 NOTE — Telephone Encounter (Signed)
Spoke to pt, per pt's request faxed letter to [phone].

## 2016-05-02 NOTE — Telephone Encounter (Signed)
Please call patient back

## 2016-05-02 NOTE — Telephone Encounter (Signed)
lmovm for pt to return call.  

## 2016-05-05 ENCOUNTER — Ambulatory Visit: Payer: BLUE CROSS/BLUE SHIELD | Attending: Family Medicine | Admitting: Physical Therapy

## 2016-05-05 ENCOUNTER — Encounter: Payer: Self-pay | Admitting: Physical Therapy

## 2016-05-05 DIAGNOSIS — M25571 Pain in right ankle and joints of right foot: Secondary | ICD-10-CM | POA: Diagnosis not present

## 2016-05-05 DIAGNOSIS — M25572 Pain in left ankle and joints of left foot: Secondary | ICD-10-CM | POA: Diagnosis not present

## 2016-05-05 DIAGNOSIS — M6281 Muscle weakness (generalized): Secondary | ICD-10-CM | POA: Diagnosis not present

## 2016-05-05 DIAGNOSIS — R600 Localized edema: Secondary | ICD-10-CM | POA: Diagnosis not present

## 2016-05-05 NOTE — Therapy (Signed)
[facility] [address] [place], [place], [address] Phone: [phone]   Fax:  [phone]  Physical Therapy Treatment  Patient Details  Name: Morgan Long MRN: [id number] Date of Birth: 1970-08-13 Referring Provider: Dr. Tamala Julian  Encounter Date: 05/05/2016      PT End of Session - 05/05/16 1112    Visit Number 9   Number of Visits 29   Date for PT Re-Evaluation 05/19/16   Authorization Type 30 visit limit --1 used already   PT Start Time 1108   PT Stop Time 1200   PT Time Calculation (min) 52 min   Activity Tolerance Patient limited by pain      Past Medical History:  Diagnosis Date  . Allergic rhinitis   . Chronic migraine    Dr. Catalina Gravel  . Dry eye syndrome of both lacrimal glands   . Endometriosis   . GERD (gastroesophageal reflux disease)   . Headache(784.0)    occasional, dx w/ Migraines before, Topamax helps  . Nuclear cataract of both eyes    Mild  . Sinusitis   . TMJ pain dysfunction syndrome    occasional    Past Surgical History:  Procedure Laterality Date  . BREAST ENHANCEMENT SURGERY  2006  . endrometroisis    . fallopian tube removed     Left    There were no vitals filed for this visit.      Subjective Assessment - 05/05/16 1111    Subjective Pt reports ankle feeling ok. Feels she is making progress just very slowly.    Pertinent History left bunionectomy   Limitations Walking   How long can you walk comfortably? 20-30 minutes   Diagnostic tests x-ray after fall    Patient Stated Goals get back to regular ex regimen, stop swelling;    Currently in Pain? Yes   Pain Score 6    Pain Location Ankle   Pain Orientation Right;Left   Pain Type Chronic pain   Pain Onset More than a month ago   Pain Frequency Constant                         [facility] Adult PT Treatment/Exercise - 05/05/16 0001      Vasopneumatic   Number Minutes Vasopneumatic  15 minutes   Vasopnuematic Location  Ankle  both   Vasopneumatic Pressure Medium   Vasopneumatic Temperature  3 flakes      Manual Therapy   Manual Therapy Taping   Kinesiotex Edema  Bil ankles edema and facilitative for DF and pronation     Ankle Exercises: Standing   SLS 20 seconds Bil x2  On floor   Rocker Board 2 minutes  Stretching Bil   Rebounder Weight shifting 3 ways  1 minutes each     Ankle Exercises: Seated   BAPS Sitting;Level 2  front to back, side to side, cirles 2 min each BIL                  PT Short Term Goals - 05/02/16 1528      PT SHORT TERM GOAL #1   Title The patient will report a good understanding of self care strategies for pain and swelling control including compression socks, orthotics, elevation, use of ice, basic ROM   04/20/16   Time 4   Period Weeks   Status On-going     PT SHORT TERM GOAL #2   Title The patient  will report a 25% improvement in bilateral ankle pain and swelling with usual ADLs   Time 4   Period Weeks   Status On-going           PT Long Term Goals - 04/11/16 1645      PT LONG TERM GOAL #1   Title The patient will be independent in safe self progression of HEP for further improvements in ROM and strength  05/19/16   Time 8   Period Weeks   Status On-going     PT LONG TERM GOAL #2   Title The patient will report a 60% improvement in pain and swelling with home and work ADLS   Period Weeks   Status On-going     PT LONG TERM GOAL #3   Title The patient will have improved ankle plantarflexion to 50 degrees, dorsiflexion to 8 degrees, eversion to 14 degrees needed for ambulation on uneven surfaces   Time 8   Period Weeks   Status On-going     PT LONG TERM GOAL #4   Title The patient will be able to walk > 1/2 mile for community mobility    Time 8   Period Weeks   Status On-going     PT LONG TERM GOAL #5   Title Ankle strength grossly 4/5 needed for standing/walking longer periods of time   Time 8   Period  Weeks   Status On-going     PT LONG TERM GOAL #6   Title FOTO functional outcome score improved from 63% to 42% indicating improved function with less pain   Time 8   Period Weeks   Status On-going               Plan - 05/05/16 1130    Clinical Impression Statement Pt continues to have Bil ankle pain and weakness Lt> Rt. Pt continues to be limited with strengthening due to pain. Edema taping applie to Bil ankles. Pt will continue to benefit from skilled therapy for Bil ankle strenght and stability.    Rehab Potential Good   PT Frequency 2x / week   PT Duration 8 weeks   PT Treatment/Interventions ADLs/Self Care Home Management;Cryotherapy;Electrical Stimulation;Iontophoresis 4mg /ml Dexamethasone;Ultrasound;Therapeutic exercise;Patient/family education;Manual techniques;Dry needling;Taping;Vasopneumatic Device   PT Next Visit Plan Continue strenghtening and stabilization, vasocompressionfor swelling   Consulted and Agree with Plan of Care Patient      Patient will benefit from skilled therapeutic intervention in order to improve the following deficits and impairments:  Pain, Increased edema, Decreased strength, Decreased range of motion  Visit Diagnosis: Pain in right ankle and joints of right foot  Pain in left ankle and joints of left foot  Localized edema  Muscle weakness (generalized)     Problem List Patient Active Problem List   Diagnosis Date Noted  . Peroneal tendinitis of left lower extremity 11/03/2015  . Pes anserine bursitis 11/03/2015  . Asthma with acute exacerbation 06/15/2015  . Acute sinusitis 06/15/2015  . PCP NOTES >>>> 02/23/2015  . GERD (gastroesophageal reflux disease) 01/13/2015  . Myalgia and myositis 07/22/2014  . Paresthesias 06/02/2014  . Acute pharyngitis 05/20/2014  . Strep pharyngitis 05/20/2014  . Degenerative cervical disc 10/28/2013  . Bursitis, shoulder 09/17/2013  . Muscle spasm of back 09/11/2013  . Nonallopathic lesion of  thoracic region 09/11/2013  . General medical examination 03/28/2011  . Allergic rhinitis   . TMJ PAIN 12/16/2007  . Headache(784.0) 12/16/2007    Mikle Bosworth PTA 05/05/2016, 11:47 AM  [facility] [address] [place], [place], [address] Phone: [phone]   Fax:  [phone]  Name: Morgan Long MRN: [id number] Date of Birth: 1971/05/20

## 2016-05-09 ENCOUNTER — Ambulatory Visit: Payer: BLUE CROSS/BLUE SHIELD | Admitting: Physical Therapy

## 2016-05-09 ENCOUNTER — Encounter: Payer: Self-pay | Admitting: Physical Therapy

## 2016-05-09 DIAGNOSIS — M25572 Pain in left ankle and joints of left foot: Secondary | ICD-10-CM

## 2016-05-09 DIAGNOSIS — R600 Localized edema: Secondary | ICD-10-CM | POA: Diagnosis not present

## 2016-05-09 DIAGNOSIS — M25571 Pain in right ankle and joints of right foot: Secondary | ICD-10-CM | POA: Diagnosis not present

## 2016-05-09 DIAGNOSIS — M6281 Muscle weakness (generalized): Secondary | ICD-10-CM | POA: Diagnosis not present

## 2016-05-09 NOTE — Therapy (Signed)
[facility] [address], [place], [place], [address] Phone: [phone]   Fax:  [phone]  Physical Therapy Treatment  Patient Details  Name: Morgan Long MRN: [id number] Date of Birth: January 21, 1971 Referring Provider: Dr. Tamala Julian  Encounter Date: 05/09/2016      PT End of Session - 05/09/16 1551    Visit Number 10   Number of Visits 29   Date for PT Re-Evaluation 05/19/16   Authorization Type 30 visit limit --1 used already   PT Start Time 1543  Pt 13 minutes late   PT Stop Time 1625   PT Time Calculation (min) 42 min   Activity Tolerance Patient limited by pain      Past Medical History:  Diagnosis Date  . Allergic rhinitis   . Chronic migraine    Dr. Catalina Gravel  . Dry eye syndrome of both lacrimal glands   . Endometriosis   . GERD (gastroesophageal reflux disease)   . Headache(784.0)    occasional, dx w/ Migraines before, Topamax helps  . Nuclear cataract of both eyes    Mild  . Sinusitis   . TMJ pain dysfunction syndrome    occasional    Past Surgical History:  Procedure Laterality Date  . BREAST ENHANCEMENT SURGERY  2006  . endrometroisis    . fallopian tube removed     Left    There were no vitals filed for this visit.      Subjective Assessment - 05/09/16 1546    Subjective Pt reports ankles feeling ok, a little sore after last visit but feels ok about progressing standing exercises.    Pertinent History left bunionectomy   Limitations Walking   How long can you walk comfortably? 20-30 minutes   Diagnostic tests x-ray after fall    Patient Stated Goals get back to regular ex regimen, stop swelling;    Currently in Pain? Yes   Pain Score 6   Lt 6 Rt 5   Pain Location Ankle   Pain Orientation Right;Left   Pain Type Chronic pain   Pain Onset More than a month ago   Pain Frequency Constant   Pain Relieving Factors Elevation   Multiple Pain Sites No                          [facility] Adult PT Treatment/Exercise - 05/09/16 0001      Vasopneumatic   Number Minutes Vasopneumatic  15 minutes   Vasopnuematic Location  Ankle  both   Vasopneumatic Pressure Medium   Vasopneumatic Temperature  3 flakes      Manual Therapy   Manual Therapy Soft tissue mobilization;Taping   Soft tissue mobilization Bil acheilies tendon and lateral ligaments   Passive ROM gentle gastroc stretch   Kinesiotex Ligament Correction  for Pronation and dorsi flexion Bil ankles     Ankle Exercises: Standing   SLS 20 seconds Bil x2  On floor   Rebounder Weight shifting 3 ways  1 minutes each   Other Standing Ankle Exercises Standing marching  On floor     Ankle Exercises: Seated   BAPS Sitting;Level 2  front to back, side to side, cirles 2 min each BIL                  PT Short Term Goals - 05/09/16 1551      PT SHORT TERM GOAL #1   Title The patient will report a good  understanding of self care strategies for pain and swelling control including compression socks, orthotics, elevation, use of ice, basic ROM   04/20/16   Time 4   Period Weeks   Status On-going     PT SHORT TERM GOAL #2   Title The patient will report a 25% improvement in bilateral ankle pain and swelling with usual ADLs   Time 4   Period Weeks   Status On-going     PT SHORT TERM GOAL #3   Title The patient will have improved bilateral ankle dorsiflexion to 6 degrees needed for ROM to ascend and descend stairs   Time 4   Period Weeks   Status On-going           PT Long Term Goals - 05/09/16 1551      PT LONG TERM GOAL #1   Title The patient will be independent in safe self progression of HEP for further improvements in ROM and strength  05/19/16   Time 8   Period Weeks   Status On-going     PT LONG TERM GOAL #2   Title The patient will report a 60% improvement in pain and swelling with home and work ADLS   Time 8   Period Weeks   Status On-going      PT LONG TERM GOAL #3   Title The patient will have improved ankle plantarflexion to 50 degrees, dorsiflexion to 8 degrees, eversion to 14 degrees needed for ambulation on uneven surfaces   Time 8   Period Weeks   Status On-going     PT LONG TERM GOAL #4   Title The patient will be able to walk > 1/2 mile for community mobility    Time 8   Period Weeks   Status On-going     PT LONG TERM GOAL #5   Title Ankle strength grossly 4/5 needed for standing/walking longer periods of time   Time 8   Period Weeks   Status On-going               Plan - 05/09/16 1647    Clinical Impression Statement Pt continues to have localized pain in Bil ankles increased with weight bearing. Pt continues to have swelling in Bil ankles. Able to tolerate all exercises well today with some discomfort with weight bearing exercises. Pt will continue to benefit from skilled therapy for Bil ankle strength and stability.    Rehab Potential Good   PT Frequency 2x / week   PT Duration 8 weeks   PT Treatment/Interventions ADLs/Self Care Home Management;Cryotherapy;Electrical Stimulation;Iontophoresis 4mg /ml Dexamethasone;Ultrasound;Therapeutic exercise;Patient/family education;Manual techniques;Dry needling;Taping;Vasopneumatic Device   PT Next Visit Plan Continue strenghtening and stabilization, vasocompression for swelling   Consulted and Agree with Plan of Care Patient      Patient will benefit from skilled therapeutic intervention in order to improve the following deficits and impairments:  Pain, Increased edema, Decreased strength, Decreased range of motion  Visit Diagnosis: Pain in right ankle and joints of right foot  Pain in left ankle and joints of left foot  Localized edema     Problem List Patient Active Problem List   Diagnosis Date Noted  . Peroneal tendinitis of left lower extremity 11/03/2015  . Pes anserine bursitis 11/03/2015  . Asthma with acute exacerbation 06/15/2015  .  Acute sinusitis 06/15/2015  . PCP NOTES >>>> 02/23/2015  . GERD (gastroesophageal reflux disease) 01/13/2015  . Myalgia and myositis 07/22/2014  . Paresthesias 06/02/2014  . Acute pharyngitis 05/20/2014  .  Strep pharyngitis 05/20/2014  . Degenerative cervical disc 10/28/2013  . Bursitis, shoulder 09/17/2013  . Muscle spasm of back 09/11/2013  . Nonallopathic lesion of thoracic region 09/11/2013  . General medical examination 03/28/2011  . Allergic rhinitis   . TMJ PAIN 12/16/2007  . Headache(784.0) 12/16/2007    Mikle Bosworth PTA 05/09/2016, 4:51 PM  [facility] [address] [place], [place], [address] Phone: [phone]   Fax:  [phone]  Name: Morgan Long MRN: [id number] Date of Birth: [birth year]

## 2016-05-12 ENCOUNTER — Ambulatory Visit: Payer: BLUE CROSS/BLUE SHIELD | Admitting: Physical Therapy

## 2016-05-16 ENCOUNTER — Ambulatory Visit: Payer: BLUE CROSS/BLUE SHIELD | Admitting: Physical Therapy

## 2016-05-16 DIAGNOSIS — M25572 Pain in left ankle and joints of left foot: Secondary | ICD-10-CM

## 2016-05-16 DIAGNOSIS — R600 Localized edema: Secondary | ICD-10-CM

## 2016-05-16 DIAGNOSIS — M6281 Muscle weakness (generalized): Secondary | ICD-10-CM | POA: Diagnosis not present

## 2016-05-16 DIAGNOSIS — M25571 Pain in right ankle and joints of right foot: Secondary | ICD-10-CM

## 2016-05-16 NOTE — Therapy (Signed)
[facility] [address], [place], [place], [address] Phone: [phone]   Fax:  [phone]  Physical Therapy Treatment  Patient Details  Name: Morgan Long MRN: [id number] Date of Birth: 27-Jan-1971 Referring Provider: Dr. Tamala Julian  Encounter Date: 05/16/2016      PT End of Session - 05/16/16 1538    Visit Number 11   Number of Visits 29   Date for PT Re-Evaluation 05/19/16   Authorization Type 30 visit limit --1 used already   PT Start Time 1531   PT Stop Time 1620   PT Time Calculation (min) 49 min   Activity Tolerance Patient limited by pain      Past Medical History:  Diagnosis Date  . Allergic rhinitis   . Chronic migraine    Dr. Catalina Gravel  . Dry eye syndrome of both lacrimal glands   . Endometriosis   . GERD (gastroesophageal reflux disease)   . Headache(784.0)    occasional, dx w/ Migraines before, Topamax helps  . Nuclear cataract of both eyes    Mild  . Sinusitis   . TMJ pain dysfunction syndrome    occasional    Past Surgical History:  Procedure Laterality Date  . BREAST ENHANCEMENT SURGERY  2006  . endrometroisis    . fallopian tube removed     Left    There were no vitals filed for this visit.      Subjective Assessment - 05/16/16 1537    Subjective Pt is planning to schedule an appointment with MD and planning to discuss having and MRI.    Pertinent History left bunionectomy   Limitations Walking   How long can you walk comfortably? 20-30 minutes   Diagnostic tests x-ray after fall    Patient Stated Goals get back to regular ex regimen, stop swelling;    Currently in Pain? Yes   Pain Score 6    Pain Location Ankle   Pain Orientation Right;Left   Pain Type Chronic pain   Pain Onset More than a month ago   Pain Frequency Constant   Aggravating Factors  Walking, standing, sitting long periods   Pain Relieving Factors Elevation   Multiple Pain Sites No                          [facility] Adult PT Treatment/Exercise - 05/16/16 0001      Vasopneumatic   Number Minutes Vasopneumatic  15 minutes   Vasopnuematic Location  Ankle  both   Vasopneumatic Pressure Medium   Vasopneumatic Temperature  3 flakes      Ankle Exercises: Seated   Towel Crunch 2 reps   Heel Raises 10 reps   Toe Raise 10 reps   BAPS --   Other Seated Ankle Exercises Ankle inversion/ eversion  with towel                  PT Short Term Goals - 05/16/16 1538      PT SHORT TERM GOAL #1   Title The patient will report a good understanding of self care strategies for pain and swelling control including compression socks, orthotics, elevation, use of ice, basic ROM   04/20/16   Time 4   Period Weeks   Status Achieved     PT SHORT TERM GOAL #2   Title The patient will report a 25% improvement in bilateral ankle pain and swelling with usual ADLs   Time 4  Period Weeks   Status On-going     PT SHORT TERM GOAL #3   Title The patient will have improved bilateral ankle dorsiflexion to 6 degrees needed for ROM to ascend and descend stairs   Time 4   Period Weeks   Status On-going           PT Long Term Goals - 05/16/16 1539      PT LONG TERM GOAL #1   Title The patient will be independent in safe self progression of HEP for further improvements in ROM and strength  05/19/16   Time 8   Period Weeks   Status On-going     PT LONG TERM GOAL #2   Title The patient will report a 60% improvement in pain and swelling with home and work ADLS   Time 8   Period Weeks   Status On-going     PT LONG TERM GOAL #3   Title The patient will have improved ankle plantarflexion to 50 degrees, dorsiflexion to 8 degrees, eversion to 14 degrees needed for ambulation on uneven surfaces   Time 8   Period Weeks   Status On-going     PT LONG TERM GOAL #4   Title The patient will be able to walk > 1/2 mile for community mobility    Time 8   Period Weeks    Status On-going     PT LONG TERM GOAL #5   Title Ankle strength grossly 4/5 needed for standing/walking longer periods of time   Time 8   Period Weeks   Status On-going     PT LONG TERM GOAL #6   Title FOTO functional outcome score improved from 63% to 42% indicating improved function with less pain   Time 8   Period Weeks   Status On-going               Plan - 05/16/16 1611    Clinical Impression Statement Pt continues to have pain and swelling that is limiting physical activites. Pt is planning to call MD and schedule another visit to discuss having an MRI. Pt able to tolerate all exercsies with some ankle fatigue but no increase in pain. Pt will continue to benefit from skilled therapy for ankle strength, stability, and ROM.    Rehab Potential Good   PT Frequency 2x / week   PT Duration 8 weeks   PT Treatment/Interventions ADLs/Self Care Home Management;Cryotherapy;Electrical Stimulation;Iontophoresis 4mg /ml Dexamethasone;Ultrasound;Therapeutic exercise;Patient/family education;Manual techniques;Dry needling;Taping;Vasopneumatic Device   PT Next Visit Plan Continue strenghtening and stabilization, vasocompression for swelling   Consulted and Agree with Plan of Care Patient      Patient will benefit from skilled therapeutic intervention in order to improve the following deficits and impairments:  Pain, Increased edema, Decreased strength, Decreased range of motion  Visit Diagnosis: Pain in right ankle and joints of right foot  Pain in left ankle and joints of left foot  Localized edema  Muscle weakness (generalized)     Problem List Patient Active Problem List   Diagnosis Date Noted  . Peroneal tendinitis of left lower extremity 11/03/2015  . Pes anserine bursitis 11/03/2015  . Asthma with acute exacerbation 06/15/2015  . Acute sinusitis 06/15/2015  . PCP NOTES >>>> 02/23/2015  . GERD (gastroesophageal reflux disease) 01/13/2015  . Myalgia and myositis  07/22/2014  . Paresthesias 06/02/2014  . Acute pharyngitis 05/20/2014  . Strep pharyngitis 05/20/2014  . Degenerative cervical disc 10/28/2013  . Bursitis, shoulder 09/17/2013  . Muscle  spasm of back 09/11/2013  . Nonallopathic lesion of thoracic region 09/11/2013  . General medical examination 03/28/2011  . Allergic rhinitis   . TMJ PAIN 12/16/2007  . Headache(784.0) 12/16/2007    Mikle Bosworth PTA 05/16/2016, 5:07 PM  [facility] [address] [place], [place], [address] Phone: [phone]   Fax:  [phone]  Name: Morgan Long MRN: [id number] Date of Birth: 21-Nov-1970

## 2016-05-18 ENCOUNTER — Encounter: Payer: Self-pay | Admitting: Family Medicine

## 2016-05-18 DIAGNOSIS — G8929 Other chronic pain: Secondary | ICD-10-CM

## 2016-05-18 DIAGNOSIS — M25572 Pain in left ankle and joints of left foot: Principal | ICD-10-CM

## 2016-05-19 ENCOUNTER — Ambulatory Visit: Payer: BLUE CROSS/BLUE SHIELD | Admitting: Physical Therapy

## 2016-05-19 ENCOUNTER — Encounter: Payer: Self-pay | Admitting: Physical Therapy

## 2016-05-19 DIAGNOSIS — M25571 Pain in right ankle and joints of right foot: Secondary | ICD-10-CM | POA: Diagnosis not present

## 2016-05-19 DIAGNOSIS — R600 Localized edema: Secondary | ICD-10-CM | POA: Diagnosis not present

## 2016-05-19 DIAGNOSIS — M25572 Pain in left ankle and joints of left foot: Secondary | ICD-10-CM | POA: Diagnosis not present

## 2016-05-19 DIAGNOSIS — M6281 Muscle weakness (generalized): Secondary | ICD-10-CM | POA: Diagnosis not present

## 2016-05-19 NOTE — Therapy (Signed)
[facility] [address], [place], [place], [address] Phone: [phone]   Fax:  [phone]  Physical Therapy Treatment  Patient Details  Name: Morgan Long MRN: [id number] Date of Birth: [birth year] Referring Provider: Dr. Tamala Julian  Encounter Date: 05/19/2016      PT End of Session - 05/19/16 1144    Visit Number 12   Number of Visits 29   Date for PT Re-Evaluation 07/28/16   Authorization Type 30 visit limit --1 used already   PT Start Time 1106   PT Stop Time 1200   PT Time Calculation (min) 54 min   Activity Tolerance Patient limited by pain   Behavior During Therapy [facility] for tasks assessed/performed      Past Medical History:  Diagnosis Date  . Allergic rhinitis   . Chronic migraine    Dr. Catalina Gravel  . Dry eye syndrome of both lacrimal glands   . Endometriosis   . GERD (gastroesophageal reflux disease)   . Headache(784.0)    occasional, dx w/ Migraines before, Topamax helps  . Nuclear cataract of both eyes    Mild  . Sinusitis   . TMJ pain dysfunction syndrome    occasional    Past Surgical History:  Procedure Laterality Date  . BREAST ENHANCEMENT SURGERY  2006  . endrometroisis    . fallopian tube removed     Left    There were no vitals filed for this visit.      Subjective Assessment - 05/19/16 1108    Subjective Pt has schedule an MRI.  Pt feels like she is able to move more with the PT exercises.   Pertinent History left bunionectomy   Limitations Walking   How long can you walk comfortably? 20-30 minutes   Diagnostic tests x-ray after fall    Patient Stated Goals get back to regular ex regimen, stop swelling;    Currently in Pain? Yes   Pain Score 7   Left 7, Right is 6   Pain Location Ankle   Pain Orientation Right;Left   Pain Type Chronic pain   Pain Onset More than a month ago   Pain Frequency Intermittent   Aggravating Factors  walking, exercising, at times it's constant,  but does have no pain at times   Pain Relieving Factors elevation and ice   Effect of Pain on Daily Activities can't exercises and moving a certain way will irritate   Multiple Pain Sites No                         [facility] Adult PT Treatment/Exercise - 05/19/16 0001      Vasopneumatic   Number Minutes Vasopneumatic  15 minutes   Vasopnuematic Location  Ankle   Vasopneumatic Pressure Medium   Vasopneumatic Temperature  3 flakes      Manual Therapy   Manual Therapy Edema management   Edema Management gentle effluerage Lt lower leg - edema and desentiization for LE - educated patient in doing this more at home for pain and swelling     Ankle Exercises: Seated   Towel Inversion/Eversion 5 reps   Heel Raises 10 reps  rocker board   Toe Raise 10 reps  rocker board   Other Seated Ankle Exercises --  with towel   Other Seated Ankle Exercises toe extension - 20x      Ankle Exercises: Standing   Rebounder Weight shifting 2 ways  1 minutes each  Ankle Exercises: Aerobic   Stationary Bike Nu-Step L2 5 min                PT Education - 05/19/16 1153    Education provided Yes   Education Details added towel exercises to HEP   Person(s) Educated Patient   Methods Explanation;Verbal cues;Handout   Comprehension Verbalized understanding          PT Short Term Goals - 05/19/16 1118      PT SHORT TERM GOAL #1   Title The patient will report a good understanding of self care strategies for pain and swelling control including compression socks, orthotics, elevation, use of ice, basic ROM   04/20/16   Time 4   Period Weeks   Status Achieved     PT SHORT TERM GOAL #2   Title The patient will report a 25% improvement in bilateral ankle pain and swelling with usual ADLs   Baseline 10-15%   Time 4   Period Weeks   Status On-going     PT SHORT TERM GOAL #3   Title The patient will have improved bilateral ankle dorsiflexion to 6 degrees needed for ROM to  ascend and descend stairs   Time 4   Period Weeks   Status On-going           PT Long Term Goals - 05/19/16 1119      PT LONG TERM GOAL #1   Title The patient will be independent in safe self progression of HEP for further improvements in ROM and strength  07/28/16   Time 10   Period Weeks   Status New     PT LONG TERM GOAL #2   Title The patient will report a 60% improvement in pain and swelling with home and work ADLS   Time 10   Period Weeks   Status On-going     PT LONG TERM GOAL #3   Title The patient will have improved ankle plantarflexion to 50 degrees, dorsiflexion to 8 degrees, eversion to 14 degrees needed for ambulation on uneven surfaces   Time 10   Period Weeks   Status On-going     PT LONG TERM GOAL #4   Title The patient will be able to walk > 1/2 mile for community mobility    Time 10   Period Weeks   Status On-going     PT LONG TERM GOAL #5   Title Ankle strength grossly 4/5 needed for standing/walking longer periods of time   Time 10   Period Weeks   Status On-going     PT LONG TERM GOAL #6   Title FOTO functional outcome score improved from 63% to 42% indicating improved function with less pain   Baseline 59%   Time 10   Period Weeks   Status On-going               Plan - 05/19/16 1211    Clinical Impression Statement Patient continues to have swelling, pain up to 7/10, decreased ankle ROM, and weakness that is limiting her daily activities and getting back to working out like at the gym like she was.  Pt has scheduled an MRI.  PT suspects some level of CRPS or some nerve involvement based on patients sensativity to light touch, but MRI will be helpful to rule out any underlying issues.  Pt has made some progress and demonstrates ability to tolerate more movements and increased weight bearing exercises, but patient is progressing slowly.  Pt will benefit from skilled PT reduced to 1x/week in order to transition patient to HEP that patient  will be able to progress with on her own in order to address impairments and return to full function.   Rehab Potential Good   PT Frequency 1x / week   PT Duration Other (comment)  10 weeks   PT Treatment/Interventions ADLs/Self Care Home Management;Cryotherapy;Electrical Stimulation;Iontophoresis 4mg /ml Dexamethasone;Ultrasound;Therapeutic exercise;Patient/family education;Manual techniques;Dry needling;Taping;Vasopneumatic Device   PT Next Visit Plan Continue strenghtening and stabilization without UE to work on ankle/foot proprioception, vasocompression for swelling   PT Home Exercise Plan progress as needed so patient will do more on her own   Recommended Other Services none   Consulted and Agree with Plan of Care Patient      Patient will benefit from skilled therapeutic intervention in order to improve the following deficits and impairments:  Pain, Increased edema, Decreased strength, Decreased range of motion  Visit Diagnosis: Pain in right ankle and joints of right foot  Pain in left ankle and joints of left foot  Localized edema  Muscle weakness (generalized)     Problem List Patient Active Problem List   Diagnosis Date Noted  . Peroneal tendinitis of left lower extremity 11/03/2015  . Pes anserine bursitis 11/03/2015  . Asthma with acute exacerbation 06/15/2015  . Acute sinusitis 06/15/2015  . PCP NOTES >>>> 02/23/2015  . GERD (gastroesophageal reflux disease) 01/13/2015  . Myalgia and myositis 07/22/2014  . Paresthesias 06/02/2014  . Acute pharyngitis 05/20/2014  . Strep pharyngitis 05/20/2014  . Degenerative cervical disc 10/28/2013  . Bursitis, shoulder 09/17/2013  . Muscle spasm of back 09/11/2013  . Nonallopathic lesion of thoracic region 09/11/2013  . General medical examination 03/28/2011  . Allergic rhinitis   . TMJ PAIN 12/16/2007  . Headache(784.0) 12/16/2007    Zannie Cove , PT 05/19/2016, 12:21 PM  [facility] [address], [place], [place], [address] Phone: [phone]   Fax:  [phone]  Name: Morgan Long MRN: [id number] Date of Birth: [birth year]

## 2016-05-19 NOTE — Patient Instructions (Signed)
TOES: Towel Bunching    With involved straight toes on towel, bend toes bunching up towel ___ times. Do ___ times per day.  Copyright  VHI. All rights reserved.  Forefoot Evertors    Place right foot flat on towel, knee pointed forward. Use forefoot and toes to push towel out to side. Do not allow heel or knee to move. Hold ____ seconds. Repeat ____ times. Do ____ sessions per day. CAUTION: Repetitions should be slow and controlled.  Copyright  VHI. All rights reserved.  Forefoot Invertors    Place right foot flat on towel, knee pointed forward. Use forefoot and toes to pull towel in toward center. Do not allow heel or knee to move. Hold ____ seconds. Repeat ____ times. Do ____ sessions per day. CAUTION: Repetitions should be slow and controlled.  Copyright  VHI. All rights reserved.

## 2016-05-23 ENCOUNTER — Encounter: Payer: Self-pay | Admitting: Physical Therapy

## 2016-05-23 ENCOUNTER — Ambulatory Visit: Payer: BLUE CROSS/BLUE SHIELD | Admitting: Physical Therapy

## 2016-05-23 DIAGNOSIS — M25571 Pain in right ankle and joints of right foot: Secondary | ICD-10-CM | POA: Diagnosis not present

## 2016-05-23 DIAGNOSIS — M25572 Pain in left ankle and joints of left foot: Secondary | ICD-10-CM | POA: Diagnosis not present

## 2016-05-23 DIAGNOSIS — M6281 Muscle weakness (generalized): Secondary | ICD-10-CM | POA: Diagnosis not present

## 2016-05-23 DIAGNOSIS — R600 Localized edema: Secondary | ICD-10-CM

## 2016-05-23 NOTE — Therapy (Signed)
[facility] [address], [place], [place], [address] Phone: [phone]   Fax:  [phone]  Physical Therapy Treatment  Patient Details  Name: Morgan Long MRN: [id number] Date of Birth: 1970/11/27 Referring Provider: Dr. Tamala Julian  Encounter Date: 05/23/2016      PT End of Session - 05/23/16 1501    Visit Number 13   Number of Visits 29   Date for PT Re-Evaluation 07/28/16   Authorization Type 30 visit limit --1 used already   PT Start Time 1451   PT Stop Time 1538   PT Time Calculation (min) 47 min   Activity Tolerance Patient limited by pain   Behavior During Therapy [facility] for tasks assessed/performed      Past Medical History:  Diagnosis Date  . Allergic rhinitis   . Chronic migraine    Dr. Catalina Gravel  . Dry eye syndrome of both lacrimal glands   . Endometriosis   . GERD (gastroesophageal reflux disease)   . Headache(784.0)    occasional, dx w/ Migraines before, Topamax helps  . Nuclear cataract of both eyes    Mild  . Sinusitis   . TMJ pain dysfunction syndrome    occasional    Past Surgical History:  Procedure Laterality Date  . BREAST ENHANCEMENT SURGERY  2006  . endrometroisis    . fallopian tube removed     Left    There were no vitals filed for this visit.      Subjective Assessment - 05/23/16 1459    Subjective Pt is to have MRI on Lt ankle next week. Pt reports ankles feeling a little better today.    Pertinent History left bunionectomy   Limitations Walking   How long can you walk comfortably? 20-30 minutes   Diagnostic tests x-ray after fall    Patient Stated Goals get back to regular ex regimen, stop swelling;    Currently in Pain? Yes   Pain Score 5   Rt 5 ; Lt 6   Pain Location Ankle   Pain Orientation Right;Left   Pain Type Chronic pain   Pain Onset More than a month ago   Pain Frequency Intermittent                         [facility] Adult PT  Treatment/Exercise - 05/23/16 0001      Vasopneumatic   Number Minutes Vasopneumatic  15 minutes   Vasopnuematic Location  Ankle   Vasopneumatic Pressure Medium   Vasopneumatic Temperature  3 flakes      Ankle Exercises: Seated   Towel Crunch 2 reps   Heel Raises 10 reps  rocker board   Toe Raise 10 reps  rocker board   Other Seated Ankle Exercises Ankle inversion/ eversion  with towel     Ankle Exercises: Standing   Rebounder Weight shifting, marching  1 minute each   Other Standing Ankle Exercises Sit to stand  x5     Ankle Exercises: Aerobic   Stationary Bike Nu-Step L2 5 min  Therapist present to discuss treatment     Ankle Exercises: Stretches   Gastroc Stretch 2 reps;10 seconds  slant board                  PT Short Term Goals - 05/23/16 1501      PT SHORT TERM GOAL #1   Title The patient will report a good understanding of self care strategies for pain  and swelling control including compression socks, orthotics, elevation, use of ice, basic ROM   04/20/16   Time 4   Period Weeks   Status Achieved     PT SHORT TERM GOAL #2   Title The patient will report a 25% improvement in bilateral ankle pain and swelling with usual ADLs   Baseline 10-15%   Time 4   Period Weeks   Status On-going     PT SHORT TERM GOAL #3   Title The patient will have improved bilateral ankle dorsiflexion to 6 degrees needed for ROM to ascend and descend stairs   Time 4   Period Weeks   Status On-going           PT Long Term Goals - 05/23/16 1502      PT LONG TERM GOAL #1   Title The patient will be independent in safe self progression of HEP for further improvements in ROM and strength  07/28/16   Time 10   Period Weeks   Status On-going     PT LONG TERM GOAL #2   Title The patient will report a 60% improvement in pain and swelling with home and work ADLS   Time 10   Period Weeks   Status On-going               Plan - 05/23/16 1522    Clinical  Impression Statement Pt making slow prgress with tolerance for weight bearing and movement in Bil ankles. Pt continues to have swelling and ankle instability limiting ADL's. Pt will continue to benefit from skilled therapy for Bil ankle strength and stability.    Rehab Potential Good   PT Frequency 1x / week   PT Duration Other (comment)   PT Treatment/Interventions ADLs/Self Care Home Management;Cryotherapy;Electrical Stimulation;Iontophoresis 4mg /ml Dexamethasone;Ultrasound;Therapeutic exercise;Patient/family education;Manual techniques;Dry needling;Taping;Vasopneumatic Device   PT Next Visit Plan Continue strenghtening and stabilization without UE to work on ankle/foot proprioception, vasocompression for swelling   Consulted and Agree with Plan of Care Patient      Patient will benefit from skilled therapeutic intervention in order to improve the following deficits and impairments:  Pain, Increased edema, Decreased strength, Decreased range of motion  Visit Diagnosis: Pain in right ankle and joints of right foot  Pain in left ankle and joints of left foot  Localized edema  Muscle weakness (generalized)     Problem List Patient Active Problem List   Diagnosis Date Noted  . Peroneal tendinitis of left lower extremity 11/03/2015  . Pes anserine bursitis 11/03/2015  . Asthma with acute exacerbation 06/15/2015  . Acute sinusitis 06/15/2015  . PCP NOTES >>>> 02/23/2015  . GERD (gastroesophageal reflux disease) 01/13/2015  . Myalgia and myositis 07/22/2014  . Paresthesias 06/02/2014  . Acute pharyngitis 05/20/2014  . Strep pharyngitis 05/20/2014  . Degenerative cervical disc 10/28/2013  . Bursitis, shoulder 09/17/2013  . Muscle spasm of back 09/11/2013  . Nonallopathic lesion of thoracic region 09/11/2013  . General medical examination 03/28/2011  . Allergic rhinitis   . TMJ PAIN 12/16/2007  . Headache(784.0) 12/16/2007    Mikle Bosworth PTA 05/23/2016, 3:32 PM  [facility] [address] [place], [place], [address] Phone: [phone]   Fax:  [phone]  Name: Morgan Long MRN: [id number] Date of Birth: 20-Jul-1970

## 2016-05-25 ENCOUNTER — Encounter: Payer: Self-pay | Admitting: Family Medicine

## 2016-05-26 ENCOUNTER — Encounter: Payer: Self-pay | Admitting: Family Medicine

## 2016-05-30 ENCOUNTER — Ambulatory Visit
Admission: RE | Admit: 2016-05-30 | Discharge: 2016-05-30 | Disposition: A | Discharge status: Home / Self Care | Payer: BLUE CROSS/BLUE SHIELD | Source: Ambulatory Visit | Attending: Family Medicine | Admitting: Family Medicine

## 2016-05-30 DIAGNOSIS — M25572 Pain in left ankle and joints of left foot: Secondary | ICD-10-CM | POA: Diagnosis not present

## 2016-05-30 DIAGNOSIS — G8929 Other chronic pain: Secondary | ICD-10-CM

## 2016-06-06 ENCOUNTER — Encounter: Payer: Self-pay | Admitting: Physical Therapy

## 2016-06-06 ENCOUNTER — Ambulatory Visit: Payer: BLUE CROSS/BLUE SHIELD | Attending: Family Medicine | Admitting: Physical Therapy

## 2016-06-06 ENCOUNTER — Encounter: Payer: Self-pay | Admitting: Family Medicine

## 2016-06-06 DIAGNOSIS — M25571 Pain in right ankle and joints of right foot: Secondary | ICD-10-CM | POA: Diagnosis not present

## 2016-06-06 DIAGNOSIS — M6281 Muscle weakness (generalized): Secondary | ICD-10-CM | POA: Diagnosis not present

## 2016-06-06 DIAGNOSIS — R600 Localized edema: Secondary | ICD-10-CM | POA: Diagnosis not present

## 2016-06-06 DIAGNOSIS — M25572 Pain in left ankle and joints of left foot: Secondary | ICD-10-CM | POA: Diagnosis not present

## 2016-06-06 NOTE — Therapy (Signed)
[facility] [address], [place], [place], [address] Phone: [phone]   Fax:  [phone]  Physical Therapy Treatment  Patient Details  Name: Morgan Long MRN: [id number] Date of Birth: 1971/03/26 Referring Provider: Dr. Tamala Julian  Encounter Date: 06/06/2016      PT End of Session - 06/06/16 1544    Visit Number 14   Number of Visits 29   Date for PT Re-Evaluation 07/28/16   Authorization Type 30 visit limit --1 used already   PT Start Time 1539   PT Stop Time 1638   PT Time Calculation (min) 59 min   Activity Tolerance Patient limited by pain   Behavior During Therapy [facility] for tasks assessed/performed      Past Medical History:  Diagnosis Date  . Allergic rhinitis   . Chronic migraine    Dr. Catalina Gravel  . Dry eye syndrome of both lacrimal glands   . Endometriosis   . GERD (gastroesophageal reflux disease)   . Headache(784.0)    occasional, dx w/ Migraines before, Topamax helps  . Nuclear cataract of both eyes    Mild  . Sinusitis   . TMJ pain dysfunction syndrome    occasional    Past Surgical History:  Procedure Laterality Date  . BREAST ENHANCEMENT SURGERY  2006  . endrometroisis    . fallopian tube removed     Left    There were no vitals filed for this visit.      Subjective Assessment - 06/06/16 1542    Subjective Pt had MRI on LT ankle which showed "A healing injury". Pt has not been back to MD to disucss results.    Pertinent History left bunionectomy   Limitations Walking   How long can you walk comfortably? 20-30 minutes   Diagnostic tests x-ray after fall    Patient Stated Goals get back to regular ex regimen, stop swelling;    Currently in Pain? Yes   Pain Score 5    Pain Location Ankle   Pain Orientation Right;Left   Pain Type Chronic pain   Pain Onset More than a month ago   Pain Frequency Intermittent   Aggravating Factors  Walking, exercising   Pain Relieving Factors  elevation and ice   Effect of Pain on Daily Activities can;t exercise                         [facility] Adult PT Treatment/Exercise - 06/06/16 0001      Modalities   Modalities Ultrasound     Ultrasound   Ultrasound Location Lt lateral and superior ankle   Ultrasound Parameters 3 mHz 20% 1.0  8 minutes   Ultrasound Goals Pain     Vasopneumatic   Number Minutes Vasopneumatic  15 minutes   Vasopnuematic Location  Ankle   Vasopneumatic Pressure Medium   Vasopneumatic Temperature  3 flakes      Ankle Exercises: Standing   SLS 20 seconds Bil x2  On floor   Rebounder Weight shifting, marching  1 minute each   Heel Raises 20 reps   Other Standing Ankle Exercises Sit to stand  x5     Ankle Exercises: Aerobic   Stationary Bike Nu-Step L2 8 min  Therapist present to discuss treatment     Ankle Exercises: Seated   ABC's 1 rep  Bil   Other Seated Ankle Exercises Ankle inversion/ eversion  with towel  PT Short Term Goals - 05/23/16 1501      PT SHORT TERM GOAL #1   Title The patient will report a good understanding of self care strategies for pain and swelling control including compression socks, orthotics, elevation, use of ice, basic ROM   04/20/16   Time 4   Period Weeks   Status Achieved     PT SHORT TERM GOAL #2   Title The patient will report a 25% improvement in bilateral ankle pain and swelling with usual ADLs   Baseline 10-15%   Time 4   Period Weeks   Status On-going     PT SHORT TERM GOAL #3   Title The patient will have improved bilateral ankle dorsiflexion to 6 degrees needed for ROM to ascend and descend stairs   Time 4   Period Weeks   Status On-going           PT Long Term Goals - 05/23/16 1502      PT LONG TERM GOAL #1   Title The patient will be independent in safe self progression of HEP for further improvements in ROM and strength  07/28/16   Time 10   Period Weeks   Status On-going     PT LONG  TERM GOAL #2   Title The patient will report a 60% improvement in pain and swelling with home and work ADLS   Time 10   Period Weeks   Status On-going               Plan - 06/06/16 1715    Clinical Impression Statement Pt continues to have Bil ankle pain and swelling but reports its getting better. Pt able to tolerate all exercsies well with no increase in pain. Ultra sound to Lt ankle to decrease pain and increase tissue flexibility. Pt will continue to benefit from skilled therapy for Bil ankle strength and stability.    Rehab Potential Good   PT Frequency 1x / week   PT Duration Other (comment)   PT Treatment/Interventions ADLs/Self Care Home Management;Cryotherapy;Electrical Stimulation;Iontophoresis 4mg /ml Dexamethasone;Ultrasound;Therapeutic exercise;Patient/family education;Manual techniques;Dry needling;Taping;Vasopneumatic Device   PT Next Visit Plan Asses response to ultra sound, ankle stability   Consulted and Agree with Plan of Care Patient      Patient will benefit from skilled therapeutic intervention in order to improve the following deficits and impairments:  Pain, Increased edema, Decreased strength, Decreased range of motion  Visit Diagnosis: Pain in right ankle and joints of right foot  Pain in left ankle and joints of left foot  Localized edema  Muscle weakness (generalized)     Problem List Patient Active Problem List   Diagnosis Date Noted  . Peroneal tendinitis of left lower extremity 11/03/2015  . Pes anserine bursitis 11/03/2015  . Asthma with acute exacerbation 06/15/2015  . Acute sinusitis 06/15/2015  . PCP NOTES >>>> 02/23/2015  . GERD (gastroesophageal reflux disease) 01/13/2015  . Myalgia and myositis 07/22/2014  . Paresthesias 06/02/2014  . Acute pharyngitis 05/20/2014  . Strep pharyngitis 05/20/2014  . Degenerative cervical disc 10/28/2013  . Bursitis, shoulder 09/17/2013  . Muscle spasm of back 09/11/2013  . Nonallopathic lesion  of thoracic region 09/11/2013  . General medical examination 03/28/2011  . Allergic rhinitis   . TMJ PAIN 12/16/2007  . Headache(784.0) 12/16/2007    Mikle Bosworth PTA 06/06/2016, 5:20 PM  [facility] [address] [place], [place], [address] Phone: [phone]   Fax:  [phone]  Name: Morgan Long MRN: [id number] Date of Birth: October 02, 1970

## 2016-06-13 ENCOUNTER — Encounter: Payer: Self-pay | Admitting: Physical Therapy

## 2016-06-13 ENCOUNTER — Ambulatory Visit: Payer: BLUE CROSS/BLUE SHIELD | Admitting: Physical Therapy

## 2016-06-13 DIAGNOSIS — R600 Localized edema: Secondary | ICD-10-CM

## 2016-06-13 DIAGNOSIS — M6281 Muscle weakness (generalized): Secondary | ICD-10-CM

## 2016-06-13 DIAGNOSIS — M25571 Pain in right ankle and joints of right foot: Secondary | ICD-10-CM | POA: Diagnosis not present

## 2016-06-13 DIAGNOSIS — M25572 Pain in left ankle and joints of left foot: Secondary | ICD-10-CM

## 2016-06-13 NOTE — Therapy (Signed)
[facility] [address] [place], [place], [address] Phone: [phone]   Fax:  [phone]  Physical Therapy Treatment  Patient Details  Name: Morgan Long MRN: [id number] Date of Birth: June 28, 1970 Referring Provider: Dr. Tamala Julian  Encounter Date: 06/13/2016      PT End of Session - 06/13/16 1630    Visit Number 15   Number of Visits 29   Date for PT Re-Evaluation 07/28/16   Authorization Type 30 visit limit --1 used already   PT Start Time 1623   PT Stop Time [id number]   PT Time Calculation (min) 52 min   Activity Tolerance Patient limited by pain   Behavior During Therapy [facility] for tasks assessed/performed      Past Medical History:  Diagnosis Date  . Allergic rhinitis   . Chronic migraine    Dr. Catalina Gravel  . Dry eye syndrome of both lacrimal glands   . Endometriosis   . GERD (gastroesophageal reflux disease)   . Headache(784.0)    occasional, dx w/ Migraines before, Topamax helps  . Nuclear cataract of both eyes    Mild  . Sinusitis   . TMJ pain dysfunction syndrome    occasional    Past Surgical History:  Procedure Laterality Date  . BREAST ENHANCEMENT SURGERY  2006  . endrometroisis    . fallopian tube removed     Left    There were no vitals filed for this visit.      Subjective Assessment - 06/13/16 1627    Subjective Pt states the swelling has been better   Pertinent History left bunionectomy   Limitations Walking   How long can you walk comfortably? 20-30 minutes   Diagnostic tests x-ray after fall, MRI   Patient Stated Goals get back to regular ex regimen, stop swelling;    Currently in Pain? Yes   Pain Score 5    Pain Location Ankle   Pain Orientation Right;Left   Pain Type Chronic pain   Pain Onset More than a month ago   Pain Frequency Intermittent   Aggravating Factors  walking, exercise   Pain Relieving Factors elevation and ice   Effect of Pain on Daily Activities can't  exercise   Multiple Pain Sites No                         [facility] Adult PT Treatment/Exercise - 06/13/16 0001      Modalities   Modalities Ultrasound     Ultrasound   Ultrasound Location Lt lateral and superior ankle   Ultrasound Parameters 3 mHz 20% 1.3W/cm   Ultrasound Goals Pain     Vasopneumatic   Number Minutes Vasopneumatic  15 minutes   Vasopnuematic Location  Ankle   Vasopneumatic Pressure Medium   Vasopneumatic Temperature  3 flakes      Ankle Exercises: Aerobic   Stationary Bike Nu-Step L2 8 min  Therapist present to discuss treatment     Ankle Exercises: Standing   SLS 20 seconds Bil x2  On floor   Rebounder Weight shifting, marching  1 minute each   Heel Raises 20 reps   Other Standing Ankle Exercises Sit to stand  20x     Ankle Exercises: Seated   Other Seated Ankle Exercises Ankle inversion/ eversion  with towel                  PT Short Term Goals - 06/13/16 1629  PT SHORT TERM GOAL #2   Title The patient will report a 25% improvement in bilateral ankle pain and swelling with usual ADLs   Baseline 35% improved   Time 4   Period Weeks   Status Achieved           PT Long Term Goals - 05/23/16 1502      PT LONG TERM GOAL #1   Title The patient will be independent in safe self progression of HEP for further improvements in ROM and strength  07/28/16   Time 10   Period Weeks   Status On-going     PT LONG TERM GOAL #2   Title The patient will report a 60% improvement in pain and swelling with home and work ADLS   Time 10   Period Weeks   Status On-going               Plan - 06/13/16 1632    Clinical Impression Statement Pt has progressed with decreased pain and is progressing slowly with strengthening due to slowly healing injury.  Has some increased pain and instability with single leg standing.  Pt continues to benefit from skilled PT for increased strength and edema and pain management.     Rehab  Potential Good   PT Frequency 1x / week   PT Treatment/Interventions ADLs/Self Care Home Management;Cryotherapy;Electrical Stimulation;Iontophoresis 4mg /ml Dexamethasone;Ultrasound;Therapeutic exercise;Patient/family education;Manual techniques;Dry needling;Taping;Vasopneumatic Device   PT Next Visit Plan progress ankle stability   Consulted and Agree with Plan of Care Patient      Patient will benefit from skilled therapeutic intervention in order to improve the following deficits and impairments:  Pain, Increased edema, Decreased strength, Decreased range of motion  Visit Diagnosis: Pain in right ankle and joints of right foot  Pain in left ankle and joints of left foot  Localized edema  Muscle weakness (generalized)     Problem List Patient Active Problem List   Diagnosis Date Noted  . Peroneal tendinitis of left lower extremity 11/03/2015  . Pes anserine bursitis 11/03/2015  . Asthma with acute exacerbation 06/15/2015  . Acute sinusitis 06/15/2015  . PCP NOTES >>>> 02/23/2015  . GERD (gastroesophageal reflux disease) 01/13/2015  . Myalgia and myositis 07/22/2014  . Paresthesias 06/02/2014  . Acute pharyngitis 05/20/2014  . Strep pharyngitis 05/20/2014  . Degenerative cervical disc 10/28/2013  . Bursitis, shoulder 09/17/2013  . Muscle spasm of back 09/11/2013  . Nonallopathic lesion of thoracic region 09/11/2013  . General medical examination 03/28/2011  . Allergic rhinitis   . TMJ PAIN 12/16/2007  . Headache(784.0) 12/16/2007    Zannie Cove, PT 06/13/2016, 5:09 PM  [facility] [address] [place], [place], [address] Phone: [phone]   Fax:  [phone]  Name: Morgan Long MRN: [id number] Date of Birth: [birth year]

## 2016-06-14 DIAGNOSIS — H16223 Keratoconjunctivitis sicca, not specified as Sjogren's, bilateral: Secondary | ICD-10-CM | POA: Diagnosis not present

## 2016-06-14 DIAGNOSIS — H04123 Dry eye syndrome of bilateral lacrimal glands: Secondary | ICD-10-CM | POA: Diagnosis not present

## 2016-06-14 DIAGNOSIS — R51 Headache: Secondary | ICD-10-CM | POA: Diagnosis not present

## 2016-06-14 DIAGNOSIS — H1131 Conjunctival hemorrhage, right eye: Secondary | ICD-10-CM | POA: Diagnosis not present

## 2016-06-20 ENCOUNTER — Ambulatory Visit: Payer: BLUE CROSS/BLUE SHIELD | Admitting: Physical Therapy

## 2016-06-20 ENCOUNTER — Encounter: Payer: Self-pay | Admitting: Physical Therapy

## 2016-06-20 DIAGNOSIS — M6281 Muscle weakness (generalized): Secondary | ICD-10-CM

## 2016-06-20 DIAGNOSIS — M25571 Pain in right ankle and joints of right foot: Secondary | ICD-10-CM | POA: Diagnosis not present

## 2016-06-20 DIAGNOSIS — R600 Localized edema: Secondary | ICD-10-CM

## 2016-06-20 DIAGNOSIS — M25572 Pain in left ankle and joints of left foot: Secondary | ICD-10-CM | POA: Diagnosis not present

## 2016-06-20 NOTE — Therapy (Signed)
[facility] [address], [place], [place], [address] Phone: [phone]   Fax:  [phone]  Physical Therapy Treatment  Patient Details  Name: Morgan Long MRN: [id number] Date of Birth: 04-04-1971 Referring Provider: Dr. Tamala Julian  Encounter Date: 06/20/2016      PT End of Session - 06/20/16 1657    Visit Number 16   Number of Visits 29   Date for PT Re-Evaluation 07/28/16   Authorization Type 30 visit limit --1 used already   PT Start Time 1614   PT Stop Time 1712   PT Time Calculation (min) 58 min   Activity Tolerance Patient limited by pain   Behavior During Therapy [facility] for tasks assessed/performed      Past Medical History:  Diagnosis Date  . Allergic rhinitis   . Chronic migraine    Dr. Catalina Gravel  . Dry eye syndrome of both lacrimal glands   . Endometriosis   . GERD (gastroesophageal reflux disease)   . Headache(784.0)    occasional, dx w/ Migraines before, Topamax helps  . Nuclear cataract of both eyes    Mild  . Sinusitis   . TMJ pain dysfunction syndrome    occasional    Past Surgical History:  Procedure Laterality Date  . BREAST ENHANCEMENT SURGERY  2006  . endrometroisis    . fallopian tube removed     Left    There were no vitals filed for this visit.      Subjective Assessment - 06/20/16 1615    Subjective Pt reports ankles feeling ok. Lt feeling worse than right.   Pertinent History left bunionectomy   Limitations Walking   How long can you walk comfortably? 20-30 minutes   Diagnostic tests x-ray after fall, MRI   Patient Stated Goals get back to regular ex regimen, stop swelling;    Currently in Pain? Yes   Pain Score 5   5/10 Lt 4/10 Rt   Pain Location Ankle   Pain Orientation Right;Left   Pain Descriptors / Indicators Aching;Sharp   Pain Type Chronic pain   Pain Onset More than a month ago   Pain Frequency Intermittent                         [facility]  Adult PT Treatment/Exercise - 06/20/16 0001      Modalities   Modalities Ultrasound     Ultrasound   Ultrasound Location Lt lateral and superior   Ultrasound Parameters 3 mHz 205 1.3 W/cm2   Ultrasound Goals Pain     Vasopneumatic   Number Minutes Vasopneumatic  15 minutes   Vasopnuematic Location  Ankle   Vasopneumatic Pressure Medium   Vasopneumatic Temperature  3 flakes      Ankle Exercises: Standing   SLS 20 seconds Bil x2  On floor   Rocker Board --  BOSU side lunge 2x10   Rebounder Weight shifting, marching  1 minute each   Heel Raises 20 reps   Other Standing Ankle Exercises Sit to stand  20x     Ankle Exercises: Aerobic   Stationary Bike Nu-Step L3 5 min  Therapist present to discuss treatment     Ankle Exercises: Stretches   Gastroc Stretch 2 reps;10 seconds  slant board     Ankle Exercises: Machines for Strengthening   Cybex Leg Press Seat 5 #60 Bil 2x10  PT Education - 06/20/16 1654    Education provided Yes   Education Details ankle resistance exercises   Person(s) Educated Patient   Methods Explanation;Handout   Comprehension Verbalized understanding          PT Short Term Goals - 06/13/16 1629      PT SHORT TERM GOAL #2   Title The patient will report a 25% improvement in bilateral ankle pain and swelling with usual ADLs   Baseline 35% improved   Time 4   Period Weeks   Status Achieved           PT Long Term Goals - 06/20/16 1631      PT LONG TERM GOAL #1   Title The patient will be independent in safe self progression of HEP for further improvements in ROM and strength  07/28/16   Time 10   Period Weeks   Status On-going     PT LONG TERM GOAL #2   Title The patient will report a 60% improvement in pain and swelling with home and work ADLS   Time 10   Period Weeks   Status On-going     PT LONG TERM GOAL #3   Title The patient will have improved ankle plantarflexion to 50 degrees, dorsiflexion to 8  degrees, eversion to 14 degrees needed for ambulation on uneven surfaces   Time 10   Period Weeks   Status On-going     PT LONG TERM GOAL #4   Title The patient will be able to walk > 1/2 mile for community mobility    Time 10   Period Weeks   Status On-going     PT LONG TERM GOAL #5   Title Ankle strength grossly 4/5 needed for standing/walking longer periods of time   Time 10   Period Weeks     PT LONG TERM GOAL #6   Title FOTO functional outcome score improved from 63% to 42% indicating improved function with less pain   Baseline 59%   Time 10   Period Weeks   Status On-going               Plan - 06/20/16 1656    Clinical Impression Statement Pt continues to progress slowly with ankle strength and stability. Continue to have audible Lt ankle popping with movements. Able to tolerate legpress and side lunges with no increase in pain. Pt reports feeling that ultra sound and vasoneumatic are helping the most. Pt will continue to benefit from skilled therapy for Bil ankle strength and stability and edema management.    Rehab Potential Good   PT Frequency 1x / week   PT Duration Other (comment)   PT Treatment/Interventions ADLs/Self Care Home Management;Cryotherapy;Electrical Stimulation;Iontophoresis 4mg /ml Dexamethasone;Ultrasound;Therapeutic exercise;Patient/family education;Manual techniques;Dry needling;Taping;Vasopneumatic Device   PT Next Visit Plan progress ankle stability   Consulted and Agree with Plan of Care Patient      Patient will benefit from skilled therapeutic intervention in order to improve the following deficits and impairments:  Pain, Increased edema, Decreased strength, Decreased range of motion  Visit Diagnosis: Pain in right ankle and joints of right foot  Pain in left ankle and joints of left foot  Localized edema  Muscle weakness (generalized)     Problem List Patient Active Problem List   Diagnosis Date Noted  . Peroneal tendinitis  of left lower extremity 11/03/2015  . Pes anserine bursitis 11/03/2015  . Asthma with acute exacerbation 06/15/2015  . Acute sinusitis 06/15/2015  .  PCP NOTES >>>> 02/23/2015  . GERD (gastroesophageal reflux disease) 01/13/2015  . Myalgia and myositis 07/22/2014  . Paresthesias 06/02/2014  . Acute pharyngitis 05/20/2014  . Strep pharyngitis 05/20/2014  . Degenerative cervical disc 10/28/2013  . Bursitis, shoulder 09/17/2013  . Muscle spasm of back 09/11/2013  . Nonallopathic lesion of thoracic region 09/11/2013  . General medical examination 03/28/2011  . Allergic rhinitis   . TMJ PAIN 12/16/2007  . Headache(784.0) 12/16/2007    Mikle Bosworth PTA 06/20/2016, 5:03 PM  [facility] [address] [place], [place], [address] Phone: [phone]   Fax:  [phone]  Name: Morgan Long MRN: [id number] Date of Birth: 11/26/1970

## 2016-06-20 NOTE — Patient Instructions (Signed)
Eversion: Resisted    With right foot in tubing loop, hold tubing around other foot to resist and turn foot out. Repeat _5__ times per set. Do __3__ sets per session. Do __3__ sessions per day.  http://orth.exer.us/15   Copyright  VHI. All rights reserved.  Ankle Inversion: Long-Sitting (Single Leg)    Tubing around forefoot, on same side as anchor, rotate ankle, pointing toes inward. Repeat _5_ times per set. Repeat with other leg. Do 3_ sets per session. Do _3_ sessions per week. Anchor Height: Ankle (when standing)  http://tub.exer.us/220   Copyright  VHI. All rights reserved.   Ankle Plantar Flexion: Long-Sitting (Single Leg)    Loop tubing around foot of straight leg, anchor with one hand. Leg straight, point toes downward. Repeat _5_ times per set. Repeat with other leg. Do _3_ sets per session. Do _3_ sessions per week.  http://tub.exer.us/216   Copyright  VHI. All rights reserved.  Morgan Long, PTA 06/20/16 4:54 PM  [facility] [address], [place], [place] [address] Phone # [phone] Fax [phone]

## 2016-06-26 ENCOUNTER — Other Ambulatory Visit: Payer: Self-pay | Admitting: Internal Medicine

## 2016-06-26 ENCOUNTER — Encounter: Payer: Self-pay | Admitting: Internal Medicine

## 2016-06-27 ENCOUNTER — Ambulatory Visit: Payer: BLUE CROSS/BLUE SHIELD | Admitting: Physical Therapy

## 2016-06-27 ENCOUNTER — Encounter: Payer: Self-pay | Admitting: Physical Therapy

## 2016-06-27 DIAGNOSIS — M25571 Pain in right ankle and joints of right foot: Secondary | ICD-10-CM

## 2016-06-27 DIAGNOSIS — R600 Localized edema: Secondary | ICD-10-CM | POA: Diagnosis not present

## 2016-06-27 DIAGNOSIS — M6281 Muscle weakness (generalized): Secondary | ICD-10-CM

## 2016-06-27 DIAGNOSIS — M25572 Pain in left ankle and joints of left foot: Secondary | ICD-10-CM

## 2016-06-27 NOTE — Therapy (Signed)
[facility] [address], [place], [place], [address] Phone: [phone]   Fax:  [phone]  Physical Therapy Treatment  Patient Details  Name: Morgan Long MRN: [id number] Date of Birth: 03-30-1971 Referring Provider: Dr. Tamala Julian  Encounter Date: 06/27/2016      PT End of Session - 06/27/16 1616    Visit Number 17   Number of Visits 29   Date for PT Re-Evaluation 07/28/16   Authorization Type 30 visit limit --1 used already   PT Start Time 1615   PT Stop Time 1707   PT Time Calculation (min) 52 min   Activity Tolerance Patient limited by pain   Behavior During Therapy [facility] for tasks assessed/performed      Past Medical History:  Diagnosis Date  . Allergic rhinitis   . Chronic migraine    Dr. Catalina Gravel  . Dry eye syndrome of both lacrimal glands   . Endometriosis   . GERD (gastroesophageal reflux disease)   . Headache(784.0)    occasional, dx w/ Migraines before, Topamax helps  . Nuclear cataract of both eyes    Mild  . Sinusitis   . TMJ pain dysfunction syndrome    occasional    Past Surgical History:  Procedure Laterality Date  . BREAST ENHANCEMENT SURGERY  2006  . endrometroisis    . fallopian tube removed     Left    There were no vitals filed for this visit.      Subjective Assessment - 06/27/16 1615    Subjective Pt reports ankles doing pretty good today.    Pertinent History left bunionectomy   Limitations Walking   How long can you walk comfortably? 20-30 minutes   Diagnostic tests x-ray after fall, MRI   Patient Stated Goals get back to regular ex regimen, stop swelling;    Currently in Pain? Yes   Pain Score --  3/10 Rt 4/10 Lt   Pain Location Ankle   Pain Orientation Right;Left   Pain Descriptors / Indicators Aching;Sharp   Pain Type Chronic pain   Pain Onset More than a month ago   Pain Frequency Intermittent   Aggravating Factors  walking, exercise   Pain Relieving Factors  elevation and ice   Effect of Pain on Daily Activities can't exercise                         [facility] Adult PT Treatment/Exercise - 06/27/16 0001      Exercises   Exercises Knee/Hip     Knee/Hip Exercises: Standing   Heel Raises Both;15 reps;1 set   Hip Abduction Stengthening;Both;10 reps;2 sets   Hip Extension Stengthening;Both;2 sets;10 reps   Forward Step Up 2 sets;10 reps;Hand Hold: 2;Step Height: 4"     Knee/Hip Exercises: Seated   Sit to Sand 2 sets;10 reps     Modalities   Modalities Ultrasound     Ultrasound   Ultrasound Location Lt lateral   Ultrasound Parameters 3 mHz 20% 1.3 W/cm2   Ultrasound Goals Pain     Vasopneumatic   Number Minutes Vasopneumatic  15 minutes   Vasopnuematic Location  Ankle   Vasopneumatic Pressure Medium   Vasopneumatic Temperature  3 flakes     Ankle Exercises: Standing   SLS --   Rocker Board --  BOSU side lunge 2x10   Heel Raises --     Ankle Exercises: Machines for Strengthening   Cybex Leg Press Seat 5 #70  Bil 2x10     Ankle Exercises: Stretches   Gastroc Stretch 2 reps;10 seconds  slant board     Ankle Exercises: Aerobic   Stationary Bike Nu-Step L3 5 min  Therapist present to discuss treatment                  PT Short Term Goals - 06/27/16 1617      PT SHORT TERM GOAL #3   Title The patient will have improved bilateral ankle dorsiflexion to 6 degrees needed for ROM to ascend and descend stairs   Time 4   Period Weeks   Status On-going           PT Long Term Goals - 06/27/16 1617      PT LONG TERM GOAL #1   Title The patient will be independent in safe self progression of HEP for further improvements in ROM and strength  07/28/16   Time 10   Period Weeks   Status On-going     PT LONG TERM GOAL #2   Title The patient will report a 60% improvement in pain and swelling with home and work ADLS   Time 10   Period Weeks   Status On-going     PT LONG TERM GOAL #3   Title The  patient will have improved ankle plantarflexion to 50 degrees, dorsiflexion to 8 degrees, eversion to 14 degrees needed for ambulation on uneven surfaces   Time 10   Period Weeks   Status On-going     PT LONG TERM GOAL #4   Title The patient will be able to walk > 1/2 mile for community mobility    Time 10   Period Weeks   Status On-going     PT LONG TERM GOAL #5   Title Ankle strength grossly 4/5 needed for standing/walking longer periods of time   Time 10   Period Weeks   Status On-going     PT LONG TERM GOAL #6   Title FOTO functional outcome score improved from 63% to 42% indicating improved function with less pain   Baseline 59%   Time 10   Period Weeks   Status On-going               Plan - 06/27/16 1632    Clinical Impression Statement Pt presents with slightly less pain today than usual. Pt able to tolerate more standing exercises well with minimal discomfort. Pt feels that ultra sound and vasocompression are helping. Pt will continue to benefit from skilled therapy for ankle strength and stability and management of edema.    Rehab Potential Good   PT Frequency 1x / week   PT Duration Other (comment)   PT Treatment/Interventions ADLs/Self Care Home Management;Cryotherapy;Electrical Stimulation;Iontophoresis 4mg /ml Dexamethasone;Ultrasound;Therapeutic exercise;Patient/family education;Manual techniques;Dry needling;Taping;Vasopneumatic Device   PT Next Visit Plan try walking with sports cord; measure ankle ROM   Consulted and Agree with Plan of Care Patient      Patient will benefit from skilled therapeutic intervention in order to improve the following deficits and impairments:  Pain, Increased edema, Decreased strength, Decreased range of motion  Visit Diagnosis: Pain in right ankle and joints of right foot  Pain in left ankle and joints of left foot  Localized edema  Muscle weakness (generalized)     Problem List Patient Active Problem List    Diagnosis Date Noted  . Peroneal tendinitis of left lower extremity 11/03/2015  . Pes anserine bursitis 11/03/2015  . Asthma with acute  exacerbation 06/15/2015  . Acute sinusitis 06/15/2015  . PCP NOTES >>>> 02/23/2015  . GERD (gastroesophageal reflux disease) 01/13/2015  . Myalgia and myositis 07/22/2014  . Paresthesias 06/02/2014  . Acute pharyngitis 05/20/2014  . Strep pharyngitis 05/20/2014  . Degenerative cervical disc 10/28/2013  . Bursitis, shoulder 09/17/2013  . Muscle spasm of back 09/11/2013  . Nonallopathic lesion of thoracic region 09/11/2013  . General medical examination 03/28/2011  . Allergic rhinitis   . TMJ PAIN 12/16/2007  . Headache(784.0) 12/16/2007    Mikle Bosworth PTA 06/27/2016, 4:57 PM  [facility] [address], [place], [place], [address] Phone: [phone]   Fax:  [phone]  Name: Morgan Long MRN: [id number] Date of Birth: [birth year]

## 2016-06-30 ENCOUNTER — Other Ambulatory Visit: Payer: Self-pay | Admitting: Internal Medicine

## 2016-06-30 NOTE — Telephone Encounter (Signed)
Patient should be scheduled an OV to discuss symptoms further given her response that she is having trouble with abd bloating, nausea, vomiting and constipation despite current therapy Can be with me next available or sooner with APP

## 2016-07-03 DIAGNOSIS — H1131 Conjunctival hemorrhage, right eye: Secondary | ICD-10-CM | POA: Diagnosis not present

## 2016-07-03 DIAGNOSIS — H04123 Dry eye syndrome of bilateral lacrimal glands: Secondary | ICD-10-CM | POA: Diagnosis not present

## 2016-07-03 DIAGNOSIS — H16223 Keratoconjunctivitis sicca, not specified as Sjogren's, bilateral: Secondary | ICD-10-CM | POA: Diagnosis not present

## 2016-07-04 ENCOUNTER — Ambulatory Visit: Payer: BLUE CROSS/BLUE SHIELD | Admitting: Physical Therapy

## 2016-07-04 ENCOUNTER — Encounter: Payer: Self-pay | Admitting: Physical Therapy

## 2016-07-04 DIAGNOSIS — M25571 Pain in right ankle and joints of right foot: Secondary | ICD-10-CM | POA: Diagnosis not present

## 2016-07-04 DIAGNOSIS — R600 Localized edema: Secondary | ICD-10-CM

## 2016-07-04 DIAGNOSIS — M25572 Pain in left ankle and joints of left foot: Secondary | ICD-10-CM | POA: Diagnosis not present

## 2016-07-04 DIAGNOSIS — M6281 Muscle weakness (generalized): Secondary | ICD-10-CM

## 2016-07-04 NOTE — Therapy (Signed)
[facility] [address], [place], [place], [address] Phone: [phone]   Fax:  [phone]  Physical Therapy Treatment  Patient Details  Name: Morgan Long MRN: [id number] Date of Birth: 1971-04-09 Referring Provider: Dr. Tamala Julian  Encounter Date: 07/04/2016      PT End of Session - 07/04/16 1616    Visit Number 18   Number of Visits 29   Date for PT Re-Evaluation 07/28/16   Authorization Type 30 visit limit --1 used already   PT Start Time 1615   PT Stop Time 1705   PT Time Calculation (min) 50 min   Activity Tolerance Patient limited by pain   Behavior During Therapy [facility] for tasks assessed/performed      Past Medical History:  Diagnosis Date  . Allergic rhinitis   . Chronic migraine    Dr. Catalina Gravel  . Dry eye syndrome of both lacrimal glands   . Endometriosis   . GERD (gastroesophageal reflux disease)   . Headache(784.0)    occasional, dx w/ Migraines before, Topamax helps  . Nuclear cataract of both eyes    Mild  . Sinusitis   . TMJ pain dysfunction syndrome    occasional    Past Surgical History:  Procedure Laterality Date  . BREAST ENHANCEMENT SURGERY  2006  . endrometroisis    . fallopian tube removed     Left    There were no vitals filed for this visit.      Subjective Assessment - 07/04/16 1614    Subjective Pt reports ankles doing ok, noticed more swelling since going down to one day a week therapy.    Pertinent History left bunionectomy   Limitations Walking   How long can you walk comfortably? 20-30 minutes   Diagnostic tests x-ray after fall, MRI   Patient Stated Goals get back to regular ex regimen, stop swelling;    Currently in Pain? Yes   Pain Score 5   Lt a bit more than Rt   Pain Location Ankle   Pain Orientation Right;Left   Pain Descriptors / Indicators Aching;Sharp   Pain Type Chronic pain   Pain Onset More than a month ago   Pain Frequency Intermittent    Aggravating Factors  walking exercise   Pain Relieving Factors elevation and ice   Effect of Pain on Daily Activities can;t exercise   Multiple Pain Sites No                         [facility] Adult PT Treatment/Exercise - 07/04/16 0001      Knee/Hip Exercises: Standing   Heel Raises Both;15 reps;1 set   Hip Abduction Stengthening;Both;10 reps;2 sets   Hip Extension Stengthening;Both;2 sets;10 reps   Forward Step Up 2 sets;10 reps;Hand Hold: 2;Step Height: 4"   Walking with Sports Cord 5x #20 back     Vasopneumatic   Number Minutes Vasopneumatic  15 minutes   Vasopnuematic Location  Ankle   Vasopneumatic Pressure Medium   Vasopneumatic Temperature  3 flakes     Ankle Exercises: Machines for Strengthening   Cybex Leg Press 3x10 #75 Bil; 2x10 single leg #30     Ankle Exercises: Aerobic   Stationary Bike Nu-Step L3 10 min  Therapist present to discuss treatment     Ankle Exercises: Stretches   Gastroc Stretch 2 reps;10 seconds  slant board  PT Short Term Goals - 06/27/16 1617      PT SHORT TERM GOAL #3   Title The patient will have improved bilateral ankle dorsiflexion to 6 degrees needed for ROM to ascend and descend stairs   Time 4   Period Weeks   Status On-going           PT Long Term Goals - 07/04/16 1622      PT LONG TERM GOAL #1   Title The patient will be independent in safe self progression of HEP for further improvements in ROM and strength  07/28/16   Time 10   Period Weeks   Status On-going     PT LONG TERM GOAL #2   Title The patient will report a 60% improvement in pain and swelling with home and work ADLS   Time 10   Period Weeks   Status On-going     PT LONG TERM GOAL #3   Title The patient will have improved ankle plantarflexion to 50 degrees, dorsiflexion to 8 degrees, eversion to 14 degrees needed for ambulation on uneven surfaces   Time 10   Period Weeks   Status On-going     PT LONG TERM GOAL #4    Title The patient will be able to walk > 1/2 mile for community mobility    Time 10   Period Weeks   Status On-going     PT LONG TERM GOAL #5   Title Ankle strength grossly 4/5 needed for standing/walking longer periods of time   Time 10   Period Weeks   Status On-going     PT LONG TERM GOAL #6   Title FOTO functional outcome score improved from 63% to 42% indicating improved function with less pain   Time 10   Period Weeks   Status On-going               Plan - 07/04/16 1650    Clinical Impression Statement Pt able to tolerate all standing exercises well. Pt continues to have ankle weakness and instability. Discussed with patient exercise options at the gym for LE strength. Pt will continue to benefit from skilled therapy for LE strength and ankle stability.    Rehab Potential Good   PT Frequency 1x / week   PT Duration Other (comment)   PT Treatment/Interventions ADLs/Self Care Home Management;Cryotherapy;Electrical Stimulation;Iontophoresis 4mg /ml Dexamethasone;Ultrasound;Therapeutic exercise;Patient/family education;Manual techniques;Dry needling;Taping;Vasopneumatic Device   PT Next Visit Plan Measure ROM   Consulted and Agree with Plan of Care Patient      Patient will benefit from skilled therapeutic intervention in order to improve the following deficits and impairments:  Pain, Increased edema, Decreased strength, Decreased range of motion  Visit Diagnosis: Pain in right ankle and joints of right foot  Pain in left ankle and joints of left foot  Localized edema  Muscle weakness (generalized)     Problem List Patient Active Problem List   Diagnosis Date Noted  . Peroneal tendinitis of left lower extremity 11/03/2015  . Pes anserine bursitis 11/03/2015  . Asthma with acute exacerbation 06/15/2015  . Acute sinusitis 06/15/2015  . PCP NOTES >>>> 02/23/2015  . GERD (gastroesophageal reflux disease) 01/13/2015  . Myalgia and myositis 07/22/2014  .  Paresthesias 06/02/2014  . Acute pharyngitis 05/20/2014  . Strep pharyngitis 05/20/2014  . Degenerative cervical disc 10/28/2013  . Bursitis, shoulder 09/17/2013  . Muscle spasm of back 09/11/2013  . Nonallopathic lesion of thoracic region 09/11/2013  . General medical examination 03/28/2011  .  Allergic rhinitis   . TMJ PAIN 12/16/2007  . Headache(784.0) 12/16/2007    Mikle Bosworth PTA 07/04/2016, 5:09 PM  [facility] [address] [place], [place], [address] Phone: [phone]   Fax:  [phone]  Name: Morgan Long MRN: [id number] Date of Birth: 09/14/1970

## 2016-07-06 ENCOUNTER — Ambulatory Visit (INDEPENDENT_AMBULATORY_CARE_PROVIDER_SITE_OTHER): Payer: BLUE CROSS/BLUE SHIELD | Admitting: Physician Assistant

## 2016-07-06 ENCOUNTER — Encounter: Payer: Self-pay | Admitting: Physician Assistant

## 2016-07-06 VITALS — BP 110/80 | HR 96 | Ht 62.0 in | Wt 199.5 lb

## 2016-07-06 DIAGNOSIS — K219 Gastro-esophageal reflux disease without esophagitis: Secondary | ICD-10-CM

## 2016-07-06 DIAGNOSIS — K3184 Gastroparesis: Secondary | ICD-10-CM

## 2016-07-06 DIAGNOSIS — R110 Nausea: Secondary | ICD-10-CM

## 2016-07-06 MED ORDER — ONDANSETRON HCL 4 MG PO TABS: ORAL_TABLET | ORAL | 0 refills | Status: DC

## 2016-07-06 MED ORDER — RANITIDINE HCL 150 MG PO TABS: 150.0000 mg | ORAL_TABLET | Freq: Two times a day (BID) | ORAL | 4 refills | Status: DC

## 2016-07-06 NOTE — Progress Notes (Addendum)
Subjective:    Patient ID: Morgan Long, female    DOB: [birth year], 46 y.o.   MRN: [id number]  HPI Darshay is a pleasant 46 year old African-American female known to Dr. Hilarie Fredrickson who has diagnosis of GERD, asthma, endometriosis and gastroparesis. She was last seen in the office in May 2017 and at that time was to transition off of Reglan and start domperidone 10 mg before meals and at bedtime. She also has chronic constipation and Linzess was decreased to 72 g daily. Line Patient had undergone upper endoscopy in August 2016 with finding of residual food in her stomach. Biopsies revealed mild focal gastritis. He did with Reglan 5 mg before meals with good response. Patient comes in today with recurrent  complaints of nausea and epigastric pain. She has not been having any vomiting. She says her symptoms are very similar. She had continued to use Reglan off and on but started noticing some twitching of her eyelid and twitching of a muscle in her upper chest and stop the Reglan altogether about 3 months ago. She states that she never received the domperidone from the pharmacy it was ordered from and therefore never started it. She has also not been on a gastroparesis diet  Review of Systems Pertinent positive and negative review of systems were noted in the above HPI section.  All other review of systems was otherwise negative.  Outpatient Encounter Prescriptions as of 07/06/2016  Medication Sig  . albuterol (PROAIR HFA) 108 (90 Base) MCG/ACT inhaler INHALE TWO PUFFS EVERY 4-6 HOURS IF NEEDED FOR COUGH WHEEZE.  Marland Kitchen AMBULATORY NON FORMULARY MEDICATION Medication Name: Domperidone 10 mg tablets Take one po before meals and at bedtime.  Marland Kitchen azelastine (ASTELIN) 0.1 % nasal spray Place 2 sprays into both nostrils daily as needed for rhinitis. Reported on 06/03/2015  . beclomethasone (QVAR) 80 MCG/ACT inhaler INHALE TWO PUFFS TWICE DAILY TO PREVENT COUGH OR WHEEZE. RINSE MOUTH AFTER USE. USE WITH SPACER.  .  clindamycin (CLEOCIN T) 1 % external solution   . DRYSOL 20 % external solution Reported on 06/03/2015  . ESTROGEL 0.75 MG/1.25 GM (0.06%) topical gel Apply 1 application topically See admin instructions.  Marland Kitchen etonogestrel-ethinyl estradiol (NUVARING) 0.12-0.015 MG/24HR vaginal ring Place 1 each vaginally every 28 (twenty-eight) days. Insert vaginally and leave in place for 3 consecutive weeks, then remove for 1 week.   Marland Kitchen FLUOCINOLONE ACETONIDE SCALP 0.01 % OIL Reported on 10/26/2015  . fluocinonide ointment (LIDEX) [id number] % Apply 1 application topically as needed.  Marland Kitchen LINZESS 145 MCG CAPS capsule TAKE 1 CAPSULE (145 MCG TOTAL) BY MOUTH DAILY.  Marland Kitchen LINZESS 72 MCG capsule TAKE 1 CAPSULE (72 MCG TOTAL) BY MOUTH DAILY BEFORE BREAKFAST.  . Multiple Vitamin (MULTIVITAMIN) LIQD Take 5 mLs by mouth daily.  . ranitidine (ZANTAC) 150 MG tablet Take 1 tablet (150 mg total) by mouth 2 (two) times daily.  Marland Kitchen spironolactone (ALDACTONE) 50 MG tablet Take 1 tablet by mouth as needed.  . [DISCONTINUED] ranitidine (ZANTAC) 150 MG tablet TAKE 1 TABLET (150 MG TOTAL) BY MOUTH 2 (TWO) TIMES DAILY.  Marland Kitchen ondansetron (ZOFRAN) 4 MG tablet Take 1 every 6 hours as needed for nausea.  . [DISCONTINUED] baclofen (LIORESAL) 10 MG tablet Take 1 tablet by mouth as needed.  . [DISCONTINUED] cyclobenzaprine (FLEXERIL) 10 MG tablet TAKE 1 TABLET (10 MG TOTAL) BY MOUTH AT BEDTIME AS NEEDED (HEADACHE AND TMJ).  . [DISCONTINUED] Diclofenac Sodium (PENNSAID) 2 % SOLN Place 2 application onto the skin 2 (two) times daily.  . [  DISCONTINUED] ibuprofen (ADVIL,MOTRIN) 800 MG tablet Take 800 mg by mouth every 8 (eight) hours as needed.  . [DISCONTINUED] metoCLOPramide (REGLAN) 5 MG tablet Take 1 tablet (5 mg total) by mouth 3 (three) times daily before meals. As needed.  . [DISCONTINUED] nortriptyline (PAMELOR) 10 MG capsule Take 50 mg by mouth at bedtime.   Facility-Administered Encounter Medications as of 07/06/2016  Medication  . predniSONE  (DELTASONE) tablet 10 mg   Allergies  Allergen Reactions  . Acetaminophen Itching  . Hydrocodone Nausea Only  . Oxycodone Itching  . Phosphate Itching    sick  . Codeine Itching and Nausea Only   Patient Active Problem List   Diagnosis Date Noted  . Peroneal tendinitis of left lower extremity 11/03/2015  . Pes anserine bursitis 11/03/2015  . Asthma with acute exacerbation 06/15/2015  . Acute sinusitis 06/15/2015  . PCP NOTES >>>> 02/23/2015  . GERD (gastroesophageal reflux disease) 01/13/2015  . Myalgia and myositis 07/22/2014  . Paresthesias 06/02/2014  . Acute pharyngitis 05/20/2014  . Strep pharyngitis 05/20/2014  . Degenerative cervical disc 10/28/2013  . Bursitis, shoulder 09/17/2013  . Muscle spasm of back 09/11/2013  . Nonallopathic lesion of thoracic region 09/11/2013  . General medical examination 03/28/2011  . Allergic rhinitis   . TMJ PAIN 12/16/2007  . Headache(784.0) 12/16/2007   Social History   Social History  . Marital status: Single    Spouse name: N/A  . Number of children: 0  . Years of education: BS   Occupational History  . 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 Smoking status: Never Smoker  . Smokeless tobacco: Never Used  . Alcohol use 0.0 oz/week     Comment: socially   . Drug use: No  . Sexual activity: No   Other Topics Concern  . Not on file   Social History Narrative   Household:sister and her 3 kids    Drinks occasional starbucks drink           Ms. Broker's family history includes Breast cancer in her maternal aunt, maternal aunt, maternal aunt, and maternal aunt; Heart disease in her father; Hypertension in her maternal aunt; Pancreatic cancer in her maternal aunt; Throat cancer in her maternal uncle and mother.      Objective:    Vitals:   07/06/16 1507  BP: 110/80  Pulse: 96    Physical Exam  WD African-American female in no acute  distress, pleasant blood pressure 110/80 pulse 96, height 5 foot 2 weight 199 BMI 36.4. HEENT; nontraumatic normocephalic EOMI PERRLA, Cardiovascular; regular rate and rhythm with S1-S2 no murmur or gallop, Pulmonary ;clear bilaterally, Abdomen; obese soft she has some tenderness in the epigastrium no palpable mass or hepatosplenomegaly no succussion splash, Bowel sounds are present, Rectal; exam not done, Extremities; no clubbing cyanosis or edema skin warm and dry, Neuropsych ;mood and affect appropriate       Assessment & Plan:   #37 46 year old African-American female with history of GERD and gastroparesis. Symptoms currently poorly controlled off therapy. Patient had responded to Reglan, but a few months back developed some muscle spasms and twitching and stop Reglan altogether. She was to start domperidone May 2017 but says she never received the medication. #2 obesity #3 asthma #4 history of endometriosis #5 chronic constipation  Plan; start step 3 gastroparesis diet-printed diet given the patient Advised to use Zantac 150 mg by mouth twice a  day on a regular basis short-term Prescription sent for Zofran 4 mg every 6 hours when necessary for nausea Will reinitiate prescription for domperidone 10 mg before meals and at bedtime Patient is currently using Linzess72 mcg during the week and 145 g on the weekends We'll schedule for follow-up office visit with Dr. Hilarie Fredrickson in 8-10 weeks. Patient is instructed to call if she is unable to obtain the domperidone.  Kia Stavros S Anwyn Kriegel PA-C 07/06/2016   Cc: [name], *   Addendum: Reviewed and agree with management. Jerene Bears, MD

## 2016-07-06 NOTE — Patient Instructions (Addendum)
We sent a prescription for Zofran 4 mg to your pharmacy [facility]. We also sent refills for Zantac 150 mg.  We are sending a prescription for Domperidone, 10 mg. Take 30 min before meals and at bedtime. This prescription will come to your home.  We get it from [facility].   We made an appointment with Dr. Hilarie Fredrickson for 08-23-2016 at 2:30 PM.

## 2016-07-10 ENCOUNTER — Encounter: Payer: Self-pay | Admitting: Family Medicine

## 2016-07-10 NOTE — Telephone Encounter (Signed)
She needs appointment

## 2016-07-11 ENCOUNTER — Ambulatory Visit (INDEPENDENT_AMBULATORY_CARE_PROVIDER_SITE_OTHER): Payer: BLUE CROSS/BLUE SHIELD | Admitting: Family Medicine

## 2016-07-11 ENCOUNTER — Ambulatory Visit: Payer: BLUE CROSS/BLUE SHIELD | Attending: Family Medicine | Admitting: Physical Therapy

## 2016-07-11 ENCOUNTER — Encounter: Payer: Self-pay | Admitting: Physical Therapy

## 2016-07-11 ENCOUNTER — Encounter: Payer: Self-pay | Admitting: Family Medicine

## 2016-07-11 ENCOUNTER — Encounter: Payer: Self-pay | Admitting: Internal Medicine

## 2016-07-11 VITALS — BP 117/80 | HR 84 | Temp 98.8°F | Ht 62.0 in | Wt 199.4 lb

## 2016-07-11 DIAGNOSIS — M25571 Pain in right ankle and joints of right foot: Secondary | ICD-10-CM

## 2016-07-11 DIAGNOSIS — R600 Localized edema: Secondary | ICD-10-CM | POA: Insufficient documentation

## 2016-07-11 DIAGNOSIS — R52 Pain, unspecified: Secondary | ICD-10-CM

## 2016-07-11 DIAGNOSIS — M25572 Pain in left ankle and joints of left foot: Secondary | ICD-10-CM

## 2016-07-11 DIAGNOSIS — M6281 Muscle weakness (generalized): Secondary | ICD-10-CM | POA: Diagnosis not present

## 2016-07-11 DIAGNOSIS — R05 Cough: Secondary | ICD-10-CM

## 2016-07-11 DIAGNOSIS — R69 Illness, unspecified: Secondary | ICD-10-CM

## 2016-07-11 DIAGNOSIS — R0981 Nasal congestion: Secondary | ICD-10-CM | POA: Diagnosis not present

## 2016-07-11 DIAGNOSIS — J111 Influenza due to unidentified influenza virus with other respiratory manifestations: Secondary | ICD-10-CM

## 2016-07-11 DIAGNOSIS — R059 Cough, unspecified: Secondary | ICD-10-CM

## 2016-07-11 LAB — POCT INFLUENZA A: RAPID INFLUENZA A AGN: NEGATIVE

## 2016-07-11 MED ORDER — OSELTAMIVIR PHOSPHATE 75 MG PO CAPS: 75.0000 mg | ORAL_CAPSULE | Freq: Two times a day (BID) | ORAL | 0 refills | Status: DC

## 2016-07-11 MED ORDER — FLUTICASONE PROPIONATE 50 MCG/ACT NA SUSP: 2.0000 | Freq: Every day | NASAL | 0 refills | Status: DC

## 2016-07-11 MED ORDER — PROMETHAZINE-DM 6.25-15 MG/5ML PO SYRP: 5.0000 mL | ORAL_SOLUTION | Freq: Four times a day (QID) | ORAL | 0 refills | Status: DC | PRN

## 2016-07-11 NOTE — Progress Notes (Signed)
Patient ID: Morgan Long, female   DOB: 03-01-1971, 46 y.o.   MRN: [id number]   Subjective:    Patient ID: Morgan Long, female    DOB: 24-Oct-1970, 46 y.o.   MRN: [id number]  Chief Complaint  Patient presents with  . Cough  . Back Pain    Upper Back.    Cough  This is a new problem. The current episode started in the past 7 days. The problem has been unchanged. The cough is productive of sputum. Associated symptoms include chills and a sore throat. Pertinent negatives include no chest pain, fever, headaches, rash, shortness of breath or wheezing.  Back Pain  This is a new problem. The current episode started in the past 7 days. The problem occurs intermittently. The problem is unchanged. The quality of the pain is described as stabbing and aching. The pain is at a severity of 6/10. The pain is the same all the time. Pertinent negatives include no chest pain, fever or headaches. She has tried analgesics for the symptoms. The treatment provided no relief.    Patient is in today for an acute visit. Patient has been complaining of upper back pain for the past 3 days. States that the overall pain is dull, achy; but has had a sharp, stabbing pain when breathing in deeply. Also complaining of a cough for the past 3 days with some light yellow colored mucus. Has experienced some chills and discomfort due to coughing. Denies fever. No additional concerns noted at this time.  I acted as a Education administrator for [employer], DO. Raiford Noble, [place]  Past Medical History:  Diagnosis Date  . Allergic rhinitis   . Chronic migraine    Dr. Catalina Gravel  . Dry eye syndrome of both lacrimal glands   . Endometriosis   . GERD (gastroesophageal reflux disease)   . Headache(784.0)    occasional, dx w/ Migraines before, Topamax helps  . Nuclear cataract of both eyes    Mild  . Sinusitis   . TMJ pain dysfunction syndrome    occasional    Past Surgical History:  Procedure Laterality Date  . BREAST ENHANCEMENT  SURGERY  2006  . endrometroisis    . fallopian tube removed     Left    Family History  Problem Relation Age of Onset  . Throat cancer Mother   . Heart disease Father   . Breast cancer Maternal Aunt     breast  . Breast cancer Maternal Aunt     breast  . Pancreatic cancer Maternal Aunt   . Diabetes      grandmother  . Hypertension Maternal Aunt     several family members  . Throat cancer Maternal Uncle   . Asthma      cousin, maternal  . Breast cancer Maternal Aunt     total of 5 aunts  . Breast cancer Maternal Aunt   . Colon cancer Neg Hx   . Heart attack Neg Hx     Social History   Social History  . Marital status: Single    Spouse name: N/A  . Number of children: 0  . Years of education: BS   Occupational History  . 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 Smoking status: Never Smoker  . Smokeless tobacco: Never Used  . Alcohol use 0.0 oz/week     Comment: socially   . Drug use:  No  . Sexual activity: No   Other Topics Concern  . Not on file   Social History Narrative   Household:sister and her 3 kids    Drinks occasional starbucks drink           Outpatient Medications Prior to Visit  Medication Sig Dispense Refill  . albuterol (PROAIR HFA) 108 (90 Base) MCG/ACT inhaler INHALE TWO PUFFS EVERY 4-6 HOURS IF NEEDED FOR COUGH WHEEZE. 1 Inhaler 1  . AMBULATORY NON FORMULARY MEDICATION Medication Name: Domperidone 10 mg tablets Take one po before meals and at bedtime. 200 capsule 2  . azelastine (ASTELIN) 0.1 % nasal spray Place 2 sprays into both nostrils daily as needed for rhinitis. Reported on 06/03/2015    . beclomethasone (QVAR) 80 MCG/ACT inhaler INHALE TWO PUFFS TWICE DAILY TO PREVENT COUGH OR WHEEZE. RINSE MOUTH AFTER USE. USE WITH SPACER. 1 Inhaler 5  . clindamycin (CLEOCIN T) 1 % external solution     . DRYSOL 20 % external solution Reported on 06/03/2015    .  ESTROGEL 0.75 MG/1.25 GM (0.06%) topical gel Apply 1 application topically See admin instructions.  0  . etonogestrel-ethinyl estradiol (NUVARING) 0.12-0.015 MG/24HR vaginal ring Place 1 each vaginally every 28 (twenty-eight) days. Insert vaginally and leave in place for 3 consecutive weeks, then remove for 1 week.     Marland Kitchen FLUOCINOLONE ACETONIDE SCALP 0.01 % OIL Reported on 10/26/2015    . fluocinonide ointment (LIDEX) [id number] % Apply 1 application topically as needed.  3  . LINZESS 145 MCG CAPS capsule TAKE 1 CAPSULE (145 MCG TOTAL) BY MOUTH DAILY. 30 capsule 0  . LINZESS 72 MCG capsule TAKE 1 CAPSULE (72 MCG TOTAL) BY MOUTH DAILY BEFORE BREAKFAST. 30 capsule 0  . Multiple Vitamin (MULTIVITAMIN) LIQD Take 5 mLs by mouth daily.    . ondansetron (ZOFRAN) 4 MG tablet Take 1 every 6 hours as needed for nausea. 50 tablet 0  . ranitidine (ZANTAC) 150 MG tablet Take 1 tablet (150 mg total) by mouth 2 (two) times daily. 60 tablet 4  . spironolactone (ALDACTONE) 50 MG tablet Take 1 tablet by mouth as needed.  5   Facility-Administered Medications Prior to Visit  Medication Dose Route Frequency Provider Last Rate Last Dose  . predniSONE (DELTASONE) tablet 10 mg  10 mg Oral UD Adelina Mings, MD        Allergies  Allergen Reactions  . Acetaminophen Itching  . Hydrocodone Nausea Only  . Oxycodone Itching  . Phosphate Itching    sick  . Codeine Itching and Nausea Only    Review of Systems  Constitutional: Positive for chills and malaise/fatigue. Negative for fever.  HENT: Positive for congestion and sore throat. Negative for sinus pain.   Eyes: Negative for blurred vision.  Respiratory: Positive for cough. Negative for sputum production, shortness of breath and wheezing.   Cardiovascular: Negative for chest pain, palpitations and leg swelling.  Gastrointestinal: Negative for vomiting.  Musculoskeletal: Negative for back pain.  Skin: Negative for rash.  Neurological: Negative for loss of  consciousness and headaches.       Objective:    Physical Exam  Constitutional: She is oriented to person, place, and time. Vital signs are normal. She appears well-developed and well-nourished. No distress.  HENT:  Head: Normocephalic and atraumatic.  Right Ear: Hearing, tympanic membrane, external ear and ear canal normal.  Left Ear: Hearing, tympanic membrane, external ear and ear canal normal.  Nose: Rhinorrhea present. No sinus tenderness. Right sinus  exhibits no maxillary sinus tenderness and no frontal sinus tenderness. Left sinus exhibits no maxillary sinus tenderness and no frontal sinus tenderness.  Mouth/Throat: No oropharyngeal exudate, posterior oropharyngeal edema or posterior oropharyngeal erythema.  Eyes: Conjunctivae are normal.  Neck: Normal range of motion. No thyromegaly present.  Cardiovascular: Normal rate and regular rhythm.   Pulmonary/Chest: Effort normal and breath sounds normal. She has no wheezes. She exhibits no tenderness.  Abdominal: Soft. Bowel sounds are normal. There is no tenderness.  Musculoskeletal: She exhibits no edema or deformity.  Lymphadenopathy:    She has cervical adenopathy.  Neurological: She is alert and oriented to person, place, and time.  Skin: Skin is warm and dry. She is not diaphoretic.  Psychiatric: She has a normal mood and affect. Her behavior is normal. Judgment and thought content normal.  Nursing note and vitals reviewed.   BP 117/80 (BP Location: Left Arm, Patient Position: Sitting, Cuff Size: Large)   Pulse 84   Temp 98.8 F (37.1 C) (Oral)   Ht 5\' 2"  (1.575 m)   Wt 199 lb 6.4 oz (90.4 kg)   LMP 07/07/2016   SpO2 95% Comment: RA  BMI 36.47 kg/m  Wt Readings from Last 3 Encounters:  07/11/16 199 lb 6.4 oz (90.4 kg)  07/06/16 199 lb 8 oz (90.5 kg)  01/31/16 203 lb (92.1 kg)     Lab Results  Component Value Date   WBC 11.4 (H) 03/19/2015   HGB 13.1 03/19/2015   HCT 38.9 03/19/2015   PLT 377 03/19/2015    GLUCOSE 82 03/19/2015   CHOL 199 05/14/2014   TRIG 61.0 05/14/2014   HDL 75.40 05/14/2014   LDLCALC 111 (H) 05/14/2014   ALT 30 05/14/2014   AST 21 05/14/2014   NA 137 03/19/2015   K 3.7 03/19/2015   CL 108 03/19/2015   CREATININE 0.90 03/19/2015   BUN 16 03/19/2015   CO2 22 03/19/2015   TSH 1.26 05/14/2014   HGBA1C 5.6 06/09/2015    Lab Results  Component Value Date   TSH 1.26 05/14/2014   Lab Results  Component Value Date   WBC 11.4 (H) 03/19/2015   HGB 13.1 03/19/2015   HCT 38.9 03/19/2015   MCV 86.1 03/19/2015   PLT 377 03/19/2015   Lab Results  Component Value Date   NA 137 03/19/2015   K 3.7 03/19/2015   CO2 22 03/19/2015   GLUCOSE 82 03/19/2015   BUN 16 03/19/2015   CREATININE 0.90 03/19/2015   BILITOT 0.4 05/14/2014   ALKPHOS 48 05/14/2014   AST 21 05/14/2014   ALT 30 05/14/2014   PROT 6.9 05/14/2014   ALBUMIN 3.5 05/14/2014   CALCIUM 8.7 (L) 03/19/2015   ANIONGAP 7 03/19/2015   GFR 88.89 01/12/2015   Lab Results  Component Value Date   CHOL 199 05/14/2014   Lab Results  Component Value Date   HDL 75.40 05/14/2014   Lab Results  Component Value Date   LDLCALC 111 (H) 05/14/2014   Lab Results  Component Value Date   TRIG 61.0 05/14/2014   Lab Results  Component Value Date   CHOLHDL 3 05/14/2014   Lab Results  Component Value Date   HGBA1C 5.6 06/09/2015       Assessment & Plan:   Problem List Items Addressed This Visit      Unprioritized   Influenza-like illness    tamiflu 75 mg bid x 5 days  Tylenol/ advil for muscle aches Call or rto if symptoms worsen  Other Visit Diagnoses    Nasal congestion    -  Primary   Cough       Body aches       Relevant Orders   POCT Influenza A (Completed)      I am having Ms. Jacob start on oseltamivir, promethazine-dextromethorphan, and fluticasone. I am also having her maintain her etonogestrel-ethinyl estradiol, multivitamin, azelastine, DRYSOL, clindamycin, Fluocinolone  Acetonide Scalp, fluocinonide ointment, ESTROGEL, spironolactone, albuterol, beclomethasone, AMBULATORY NON FORMULARY MEDICATION, LINZESS, LINZESS, ondansetron, and ranitidine. We will continue to administer predniSONE.  Meds ordered this encounter  Medications  . oseltamivir (TAMIFLU) 75 MG capsule    Sig: Take 1 capsule (75 mg total) by mouth 2 (two) times daily.    Dispense:  10 capsule    Refill:  0  . promethazine-dextromethorphan (PROMETHAZINE-DM) 6.25-15 MG/5ML syrup    Sig: Take 5 mLs by mouth 4 (four) times daily as needed for cough.    Dispense:  118 mL    Refill:  0  . fluticasone (FLONASE) 50 MCG/ACT nasal spray    Sig: Place 2 sprays into both nostrils daily.    Dispense:  16 g    Refill:  0    CMA served as scribe during this visit. History, Physical and Plan performed by medical provider. Documentation and orders reviewed and attested to.   [name], DO

## 2016-07-11 NOTE — Progress Notes (Signed)
Pre visit review using our clinic review tool, if applicable. No additional management support is needed unless otherwise documented below in the visit note. 

## 2016-07-11 NOTE — Patient Instructions (Signed)

## 2016-07-11 NOTE — Therapy (Signed)
[facility] [address], [place], [place], [address] Phone: [phone]   Fax:  [phone]  Physical Therapy Treatment  Patient Details  Name: Morgan Long MRN: [id number] Date of Birth: 12-14-1970 Referring Provider: Dr. Tamala Julian  Encounter Date: 07/11/2016      PT End of Session - 07/11/16 1620    Visit Number 19   Number of Visits 29   Date for PT Re-Evaluation 07/28/16   Authorization Type 30 visit limit --1 used already   PT Start Time 1608   PT Stop Time [id number]  Patient needing to leave early   PT Time Calculation (min) 23 min   Activity Tolerance Patient limited by pain   Behavior During Therapy [facility] for tasks assessed/performed      Past Medical History:  Diagnosis Date  . Allergic rhinitis   . Chronic migraine    Dr. Catalina Gravel  . Dry eye syndrome of both lacrimal glands   . Endometriosis   . GERD (gastroesophageal reflux disease)   . Headache(784.0)    occasional, dx w/ Migraines before, Topamax helps  . Nuclear cataract of both eyes    Mild  . Sinusitis   . TMJ pain dysfunction syndrome    occasional    Past Surgical History:  Procedure Laterality Date  . BREAST ENHANCEMENT SURGERY  2006  . endrometroisis    . fallopian tube removed     Left    There were no vitals filed for this visit.      Subjective Assessment - 07/11/16 1610    Subjective Pt reports some ankle pain with exercises at home and more popping than usual.    Pertinent History left bunionectomy   Limitations Walking   How long can you walk comfortably? 20-30 minutes   Diagnostic tests x-ray after fall, MRI   Patient Stated Goals get back to regular ex regimen, stop swelling;    Currently in Pain? Yes   Pain Score 6   Lt 6; Rt 4   Pain Location Ankle   Pain Orientation Right;Left   Pain Descriptors / Indicators Aching;Sharp   Pain Type Chronic pain   Pain Onset More than a month ago   Pain Frequency Intermittent   Aggravating Factors  walking, exercise   Pain Relieving Factors elevation and ice   Effect of Pain on Daily Activities can't exercise   Multiple Pain Sites No                         [facility] Adult PT Treatment/Exercise - 07/11/16 0001      Vasopneumatic   Number Minutes Vasopneumatic  15 minutes   Vasopnuematic Location  Ankle   Vasopneumatic Pressure Medium   Vasopneumatic Temperature  3 flakes     Ankle Exercises: Aerobic   Stationary Bike Nu-Step L3 8 min  Therapist present to discuss treatment                  PT Short Term Goals - 06/27/16 1617      PT SHORT TERM GOAL #3   Title The patient will have improved bilateral ankle dorsiflexion to 6 degrees needed for ROM to ascend and descend stairs   Time 4   Period Weeks   Status On-going           PT Long Term Goals - 07/04/16 1622      PT LONG TERM GOAL #1   Title The patient will  be independent in safe self progression of HEP for further improvements in ROM and strength  07/28/16   Time 10   Period Weeks   Status On-going     PT LONG TERM GOAL #2   Title The patient will report a 60% improvement in pain and swelling with home and work ADLS   Time 10   Period Weeks   Status On-going     PT LONG TERM GOAL #3   Title The patient will have improved ankle plantarflexion to 50 degrees, dorsiflexion to 8 degrees, eversion to 14 degrees needed for ambulation on uneven surfaces   Time 10   Period Weeks   Status On-going     PT LONG TERM GOAL #4   Title The patient will be able to walk > 1/2 mile for community mobility    Time 10   Period Weeks   Status On-going     PT LONG TERM GOAL #5   Title Ankle strength grossly 4/5 needed for standing/walking longer periods of time   Time 10   Period Weeks   Status On-going     PT LONG TERM GOAL #6   Title FOTO functional outcome score improved from 63% to 42% indicating improved function with less pain   Time 10   Period Weeks   Status  On-going               Plan - 07/11/16 1637    Clinical Impression Statement Pt continues to make slow progress towards goals and ankle strength. Patient needing to leave by 430 today for another doctors appointment. Patient wanted to include vasocompression in todays treatment. Pt will continue to benefit from skilled thearpy for ankle strength and stability.    Rehab Potential Good   PT Frequency 1x / week   PT Duration Other (comment)   PT Treatment/Interventions ADLs/Self Care Home Management;Cryotherapy;Electrical Stimulation;Iontophoresis 4mg /ml Dexamethasone;Ultrasound;Therapeutic exercise;Patient/family education;Manual techniques;Dry needling;Taping;Vasopneumatic Device   PT Next Visit Plan LE strength, ROM   Consulted and Agree with Plan of Care Patient      Patient will benefit from skilled therapeutic intervention in order to improve the following deficits and impairments:  Pain, Increased edema, Decreased strength, Decreased range of motion  Visit Diagnosis: Pain in right ankle and joints of right foot  Pain in left ankle and joints of left foot  Localized edema  Muscle weakness (generalized)     Problem List Patient Active Problem List   Diagnosis Date Noted  . Peroneal tendinitis of left lower extremity 11/03/2015  . Pes anserine bursitis 11/03/2015  . Asthma with acute exacerbation 06/15/2015  . Acute sinusitis 06/15/2015  . PCP NOTES >>>> 02/23/2015  . GERD (gastroesophageal reflux disease) 01/13/2015  . Myalgia and myositis 07/22/2014  . Paresthesias 06/02/2014  . Acute pharyngitis 05/20/2014  . Strep pharyngitis 05/20/2014  . Degenerative cervical disc 10/28/2013  . Bursitis, shoulder 09/17/2013  . Muscle spasm of back 09/11/2013  . Nonallopathic lesion of thoracic region 09/11/2013  . General medical examination 03/28/2011  . Allergic rhinitis   . TMJ PAIN 12/16/2007  . Headache(784.0) 12/16/2007    Mikle Bosworth PTA 07/11/2016, 4:47  PM  [facility] [address], [place], [place], [address] Phone: [phone]   Fax:  [phone]  Name: Morgan Long MRN: [id number] Date of Birth: 10-31-1970

## 2016-07-12 DIAGNOSIS — R69 Illness, unspecified: Secondary | ICD-10-CM

## 2016-07-12 DIAGNOSIS — J111 Influenza due to unidentified influenza virus with other respiratory manifestations: Secondary | ICD-10-CM | POA: Insufficient documentation

## 2016-07-12 NOTE — Assessment & Plan Note (Signed)
tamiflu 75 mg bid x 5 days  Tylenol/ advil for muscle aches Call or rto if symptoms worsen

## 2016-07-14 ENCOUNTER — Ambulatory Visit (HOSPITAL_BASED_OUTPATIENT_CLINIC_OR_DEPARTMENT_OTHER)
Admission: RE | Admit: 2016-07-14 | Discharge: 2016-07-14 | Disposition: A | Discharge status: Home / Self Care | Payer: BLUE CROSS/BLUE SHIELD | Source: Ambulatory Visit | Attending: Family Medicine | Admitting: Family Medicine

## 2016-07-14 DIAGNOSIS — R05 Cough: Secondary | ICD-10-CM | POA: Diagnosis not present

## 2016-07-14 DIAGNOSIS — R059 Cough, unspecified: Secondary | ICD-10-CM

## 2016-07-14 DIAGNOSIS — G4700 Insomnia, unspecified: Secondary | ICD-10-CM | POA: Diagnosis not present

## 2016-07-14 DIAGNOSIS — G43719 Chronic migraine without aura, intractable, without status migrainosus: Secondary | ICD-10-CM | POA: Diagnosis not present

## 2016-07-14 NOTE — Addendum Note (Signed)
Addended by: Roma Schanz R on: 07/14/2016 03:13 PM   Modules accepted: Orders

## 2016-07-17 DIAGNOSIS — H04123 Dry eye syndrome of bilateral lacrimal glands: Secondary | ICD-10-CM | POA: Diagnosis not present

## 2016-07-17 DIAGNOSIS — H1131 Conjunctival hemorrhage, right eye: Secondary | ICD-10-CM | POA: Diagnosis not present

## 2016-07-17 DIAGNOSIS — H16223 Keratoconjunctivitis sicca, not specified as Sjogren's, bilateral: Secondary | ICD-10-CM | POA: Diagnosis not present

## 2016-07-17 DIAGNOSIS — H43391 Other vitreous opacities, right eye: Secondary | ICD-10-CM | POA: Diagnosis not present

## 2016-07-18 ENCOUNTER — Ambulatory Visit: Payer: BLUE CROSS/BLUE SHIELD | Admitting: Physical Therapy

## 2016-07-18 ENCOUNTER — Encounter: Payer: Self-pay | Admitting: Physical Therapy

## 2016-07-18 DIAGNOSIS — R600 Localized edema: Secondary | ICD-10-CM | POA: Diagnosis not present

## 2016-07-18 DIAGNOSIS — M6281 Muscle weakness (generalized): Secondary | ICD-10-CM | POA: Diagnosis not present

## 2016-07-18 DIAGNOSIS — M25571 Pain in right ankle and joints of right foot: Secondary | ICD-10-CM | POA: Diagnosis not present

## 2016-07-18 DIAGNOSIS — M25572 Pain in left ankle and joints of left foot: Secondary | ICD-10-CM | POA: Diagnosis not present

## 2016-07-18 NOTE — Therapy (Signed)
[facility] [address] [place], [place], [address] Phone: [phone]   Fax:  [phone]  Physical Therapy Treatment  Patient Details  Name: Morgan Long MRN: [id number] Date of Birth: 12-15-1970 Referring Provider: Dr. Tamala Julian  Encounter Date: 07/18/2016      PT End of Session - 07/18/16 1619    Visit Number 20   Number of Visits 29   Date for PT Re-Evaluation 07/28/16   Authorization Type 30 visit limit --1 used already   PT Start Time 1615   PT Stop Time 1700   PT Time Calculation (min) 45 min   Activity Tolerance Patient limited by pain   Behavior During Therapy [facility] for tasks assessed/performed      Past Medical History:  Diagnosis Date  . Allergic rhinitis   . Chronic migraine    Dr. Catalina Gravel  . Dry eye syndrome of both lacrimal glands   . Endometriosis   . GERD (gastroesophageal reflux disease)   . Headache(784.0)    occasional, dx w/ Migraines before, Topamax helps  . Nuclear cataract of both eyes    Mild  . Sinusitis   . TMJ pain dysfunction syndrome    occasional    Past Surgical History:  Procedure Laterality Date  . BREAST ENHANCEMENT SURGERY  2006  . endrometroisis    . fallopian tube removed     Left    There were no vitals filed for this visit.      Subjective Assessment - 07/18/16 1617    Subjective Pt reports ankles feeling weak. Popping in ankles is hurting more. Pt is going to see a foot and ankle specialist.    Pertinent History left bunionectomy   Limitations Walking   How long can you walk comfortably? 20-30 minutes   Diagnostic tests x-ray after fall, MRI   Patient Stated Goals get back to regular ex regimen, stop swelling;    Currently in Pain? Yes   Pain Score 4    Pain Location Ankle   Pain Orientation Right;Left   Pain Descriptors / Indicators Aching;Sharp   Pain Type Chronic pain   Pain Onset More than a month ago   Pain Frequency Intermittent                          [facility] Adult PT Treatment/Exercise - 07/18/16 0001      Knee/Hip Exercises: Standing   Heel Raises Both;15 reps;1 set   Hip Abduction Stengthening;Both;10 reps;2 sets   Hip Extension Stengthening;Both;2 sets;10 reps   Lateral Step Up 1 set;Both;10 reps;Step Height: 4"   Forward Step Up 2 sets;10 reps;Hand Hold: 2;Step Height: 4"   Walking with Sports Cord 5x #20 back, forward     Vasopneumatic   Number Minutes Vasopneumatic  15 minutes   Vasopnuematic Location  Ankle   Vasopneumatic Pressure Medium   Vasopneumatic Temperature  3 flakes     Ankle Exercises: Supine   T-Band DF, IV, EV x20 red                  PT Short Term Goals - 07/18/16 1619      PT SHORT TERM GOAL #3   Title The patient will have improved bilateral ankle dorsiflexion to 6 degrees needed for ROM to ascend and descend stairs   Baseline 2 Lt 4 Rt   Time 4   Period Weeks   Status On-going  PT Long Term Goals - 07/18/16 1620      PT LONG TERM GOAL #1   Title The patient will be independent in safe self progression of HEP for further improvements in ROM and strength  07/28/16   Time 10   Period Weeks   Status On-going     PT LONG TERM GOAL #2   Title The patient will report a 60% improvement in pain and swelling with home and work ADLS   Time 10   Period Weeks   Status On-going     PT LONG TERM GOAL #3   Title The patient will have improved ankle plantarflexion to 50 degrees, dorsiflexion to 8 degrees, eversion to 14 degrees needed for ambulation on uneven surfaces   Time 10   Period Weeks   Status On-going     PT LONG TERM GOAL #4   Title The patient will be able to walk > 1/2 mile for community mobility    Time 10   Period Weeks   Status On-going               Plan - 07/18/16 1624    Clinical Impression Statement Pt continues to have increased ankle pain and increased popping. Feeling very weak in Bil ankles. Pt is going to  see a foot and ankle speciallist for a second opinion on injury. Pt did try exercising in the pool at gym which went well. Continues to lack dorsi flexion in Bil ankles. Pt will continue to benefit from skilled therapy for ankle strength and stability.    Rehab Potential Good   PT Frequency 1x / week   PT Duration Other (comment)   PT Treatment/Interventions ADLs/Self Care Home Management;Cryotherapy;Electrical Stimulation;Iontophoresis 4mg /ml Dexamethasone;Ultrasound;Therapeutic exercise;Patient/family education;Manual techniques;Dry needling;Taping;Vasopneumatic Device   PT Next Visit Plan Ankle strength and stability as toelrated   Consulted and Agree with Plan of Care Patient      Patient will benefit from skilled therapeutic intervention in order to improve the following deficits and impairments:  Pain, Increased edema, Decreased strength, Decreased range of motion  Visit Diagnosis: Pain in right ankle and joints of right foot  Pain in left ankle and joints of left foot  Localized edema  Muscle weakness (generalized)     Problem List Patient Active Problem List   Diagnosis Date Noted  . Influenza-like illness 07/12/2016  . Peroneal tendinitis of left lower extremity 11/03/2015  . Pes anserine bursitis 11/03/2015  . Asthma with acute exacerbation 06/15/2015  . Acute sinusitis 06/15/2015  . PCP NOTES >>>> 02/23/2015  . GERD (gastroesophageal reflux disease) 01/13/2015  . Myalgia and myositis 07/22/2014  . Paresthesias 06/02/2014  . Acute pharyngitis 05/20/2014  . Strep pharyngitis 05/20/2014  . Degenerative cervical disc 10/28/2013  . Bursitis, shoulder 09/17/2013  . Muscle spasm of back 09/11/2013  . Nonallopathic lesion of thoracic region 09/11/2013  . General medical examination 03/28/2011  . Allergic rhinitis   . TMJ PAIN 12/16/2007  . Headache(784.0) 12/16/2007    Mikle Bosworth PTA 07/18/2016, 5:06 PM  [facility] [address] [place], [place], [address] Phone: [phone]   Fax:  [phone]  Name: Morgan Long MRN: [id number] Date of Birth: [birth year]

## 2016-07-24 DIAGNOSIS — M25572 Pain in left ankle and joints of left foot: Secondary | ICD-10-CM | POA: Diagnosis not present

## 2016-07-24 DIAGNOSIS — M25372 Other instability, left ankle: Secondary | ICD-10-CM | POA: Diagnosis not present

## 2016-07-24 DIAGNOSIS — M25571 Pain in right ankle and joints of right foot: Secondary | ICD-10-CM | POA: Diagnosis not present

## 2016-07-24 DIAGNOSIS — M25371 Other instability, right ankle: Secondary | ICD-10-CM | POA: Diagnosis not present

## 2016-07-25 ENCOUNTER — Ambulatory Visit: Payer: BLUE CROSS/BLUE SHIELD | Admitting: Physical Therapy

## 2016-07-25 DIAGNOSIS — M6281 Muscle weakness (generalized): Secondary | ICD-10-CM | POA: Diagnosis not present

## 2016-07-25 DIAGNOSIS — R600 Localized edema: Secondary | ICD-10-CM | POA: Diagnosis not present

## 2016-07-25 DIAGNOSIS — M25571 Pain in right ankle and joints of right foot: Secondary | ICD-10-CM | POA: Diagnosis not present

## 2016-07-25 DIAGNOSIS — M25572 Pain in left ankle and joints of left foot: Secondary | ICD-10-CM

## 2016-07-25 NOTE — Therapy (Signed)
[facility] [address], [place], [place], [address] Phone: [phone]   Fax:  [phone]  Physical Therapy Treatment  Patient Details  Name: Morgan Long MRN: [id number] Date of Birth: Feb 19, 1971 Referring Provider: Dr. Tamala Julian  Encounter Date: 07/25/2016      PT End of Session - 07/25/16 1659    Visit Number 21   Number of Visits 29   Date for PT Re-Evaluation 10/03/16   Authorization Type 30 visit limit --1 used already   PT Start Time 1619   PT Stop Time 1708   PT Time Calculation (min) 49 min   Activity Tolerance Patient limited by pain   Behavior During Therapy [facility] for tasks assessed/performed      Past Medical History:  Diagnosis Date  . Allergic rhinitis   . Chronic migraine    Dr. Catalina Gravel  . Dry eye syndrome of both lacrimal glands   . Endometriosis   . GERD (gastroesophageal reflux disease)   . Headache(784.0)    occasional, dx w/ Migraines before, Topamax helps  . Nuclear cataract of both eyes    Mild  . Sinusitis   . TMJ pain dysfunction syndrome    occasional    Past Surgical History:  Procedure Laterality Date  . BREAST ENHANCEMENT SURGERY  2006  . endrometroisis    . fallopian tube removed     Left    There were no vitals filed for this visit.      Subjective Assessment - 07/25/16 1623    Subjective Pt states she saw ankle specialist who gave her ankle braces and is going to get another MRI.  Minimal pain today 4-5   Patient Stated Goals get back to regular ex regimen, stop swelling;    Currently in Pain? Yes   Pain Score 5    Pain Location Ankle   Pain Orientation Right;Left   Pain Descriptors / Indicators Aching;Sharp   Pain Onset More than a month ago   Aggravating Factors  walking   Pain Relieving Factors elevation   Effect of Pain on Daily Activities can't exercise   Multiple Pain Sites No            [facility] PT Assessment - 07/25/16 0001      Assessment   Medical Diagnosis left peroneal tendonitis, right ankle pain   Referring Provider Dr. Tamala Julian     AROM   Right Ankle Dorsiflexion 4   Left Ankle Dorsiflexion 2     Strength   Right Ankle Dorsiflexion 4-/5   Right Ankle Plantar Flexion 4-/5   Right Ankle Inversion 4-/5   Right Ankle Eversion 4-/5   Left Ankle Dorsiflexion 4-/5   Left Ankle Plantar Flexion 4-/5   Left Ankle Inversion 4-/5   Left Ankle Eversion 4-/5                     [facility] Adult PT Treatment/Exercise - 07/25/16 0001      Knee/Hip Exercises: Aerobic   Nustep L1 x 6 min     Knee/Hip Exercises: Standing   Heel Raises Both;1 set;20 reps  wearing small heel so less ROM   Hip Abduction Stengthening;Both;10 reps;2 sets   Hip Extension Stengthening;Both;2 sets;10 reps   Lateral Step Up 1 set;Both;10 reps;Step Height: 4";Hand Hold: 1   Forward Step Up Step Height: 4";Right;Left;1 set;20 reps;Hand Hold: 1   Walking with Sports Cord 5x #20 back, forward     Ankle Exercises: Aerobic  Stationary Bike Nu-Step L3 8 min  Therapist present to discuss treatment     Ankle Exercises: Standing   BAPS Standing;Level 1;15 reps     Ankle Exercises: Seated   Heel Raises 20 reps   Toe Raise 20 reps     Ankle Exercises: Supine   T-Band DF, IV, EV x20 red                  PT Short Term Goals - 07/25/16 1723      PT SHORT TERM GOAL #1   Title The patient will report a good understanding of self care strategies for pain and swelling control including compression socks, orthotics, elevation, use of ice, basic ROM   04/20/16   Time 4   Period Weeks   Status Achieved     PT SHORT TERM GOAL #2   Title The patient will report a 25% improvement in bilateral ankle pain and swelling with usual ADLs   Baseline 40% improved   Time 4   Period Weeks   Status Achieved     PT SHORT TERM GOAL #3   Title The patient will have improved bilateral ankle dorsiflexion to 6 degrees needed for ROM to ascend and  descend stairs   Baseline 2 Lt 4 Rt   Time 4   Period Weeks   Status On-going           PT Long Term Goals - 07/25/16 1634      PT LONG TERM GOAL #1   Title The patient will be independent in safe self progression of HEP for further improvements in ROM and strength  07/28/16   Time 10   Period Weeks   Status On-going     PT LONG TERM GOAL #2   Title The patient will report a 60% improvement in pain and swelling with home and work ADLS   Baseline [id number] improvement in swelling; 25-30% improved pain   Time 10   Period Weeks   Status On-going     PT LONG TERM GOAL #3   Title The patient will have improved ankle plantarflexion to 50 degrees, dorsiflexion to 8 degrees, eversion to 14 degrees needed for ambulation on uneven surfaces   Time 10   Period Weeks   Status On-going     PT LONG TERM GOAL #4   Title The patient will be able to walk > 1/2 mile for community mobility    Baseline does well in the water, but not far in community   Time 10   Period Weeks   Status On-going     PT LONG TERM GOAL #5   Title Ankle strength grossly 4/5 needed for standing/walking longer periods of time   Time 10   Period Weeks   Status On-going     PT LONG TERM GOAL #6   Title FOTO functional outcome score improved from 63% to 42% indicating improved function with less pain   Time 10   Period Weeks   Status On-going               Plan - 07/25/16 1700    Clinical Impression Statement Pt is overall, improved since starting therapy.  Progress has been slow due to injury being more serious than initially suspected.  Patient is returning to an ankle specialist in order to get second opinion on MRI and get some more detail about injury.  Swelling and pain are down, but still having  a lot of pain  at the end of the day.  Was sore after previous session so did not progress resistance today.  Skilled PT needed to address lack of ROM and strength impairments for improved function and to achieve  functional goals such as walking in the community.   Rehab Potential Good   PT Frequency 1x / week   PT Duration 12 weeks   PT Treatment/Interventions ADLs/Self Care Home Management;Cryotherapy;Electrical Stimulation;Iontophoresis 4mg /ml Dexamethasone;Ultrasound;Therapeutic exercise;Patient/family education;Manual techniques;Dry needling;Taping;Vasopneumatic Device   PT Next Visit Plan Ankle strength, ROM and stability as toelrated   PT Home Exercise Plan progress as needed   Consulted and Agree with Plan of Care Patient      Patient will benefit from skilled therapeutic intervention in order to improve the following deficits and impairments:  Pain, Increased edema, Decreased strength, Decreased range of motion  Visit Diagnosis: Pain in right ankle and joints of right foot - Plan: PT plan of care cert/re-cert  Pain in left ankle and joints of left foot - Plan: PT plan of care cert/re-cert  Localized edema - Plan: PT plan of care cert/re-cert  Muscle weakness (generalized) - Plan: PT plan of care cert/re-cert     Problem List Patient Active Problem List   Diagnosis Date Noted  . Influenza-like illness 07/12/2016  . Peroneal tendinitis of left lower extremity 11/03/2015  . Pes anserine bursitis 11/03/2015  . Asthma with acute exacerbation 06/15/2015  . Acute sinusitis 06/15/2015  . PCP NOTES >>>> 02/23/2015  . GERD (gastroesophageal reflux disease) 01/13/2015  . Myalgia and myositis 07/22/2014  . Paresthesias 06/02/2014  . Acute pharyngitis 05/20/2014  . Strep pharyngitis 05/20/2014  . Degenerative cervical disc 10/28/2013  . Bursitis, shoulder 09/17/2013  . Muscle spasm of back 09/11/2013  . Nonallopathic lesion of thoracic region 09/11/2013  . General medical examination 03/28/2011  . Allergic rhinitis   . TMJ PAIN 12/16/2007  . Headache(784.0) 12/16/2007    Zannie Cove, PT 07/25/2016, 5:35 PM  [facility] [address] [place], [place], [address] Phone: [phone]   Fax:  [phone]  Name: Morgan Long MRN: [id number] Date of Birth: 11-02-1970

## 2016-07-30 ENCOUNTER — Encounter: Payer: Self-pay | Admitting: Family Medicine

## 2016-07-31 ENCOUNTER — Ambulatory Visit (INDEPENDENT_AMBULATORY_CARE_PROVIDER_SITE_OTHER): Payer: BLUE CROSS/BLUE SHIELD | Admitting: Family Medicine

## 2016-07-31 ENCOUNTER — Encounter: Payer: Self-pay | Admitting: Family Medicine

## 2016-07-31 ENCOUNTER — Other Ambulatory Visit: Payer: Self-pay | Admitting: Family Medicine

## 2016-07-31 VITALS — BP 120/78 | HR 88 | Temp 98.9°F | Resp 16 | Ht 62.0 in | Wt 199.6 lb

## 2016-07-31 DIAGNOSIS — R509 Fever, unspecified: Secondary | ICD-10-CM | POA: Diagnosis not present

## 2016-07-31 DIAGNOSIS — R52 Pain, unspecified: Secondary | ICD-10-CM | POA: Diagnosis not present

## 2016-07-31 DIAGNOSIS — R05 Cough: Secondary | ICD-10-CM

## 2016-07-31 DIAGNOSIS — Z23 Encounter for immunization: Secondary | ICD-10-CM

## 2016-07-31 DIAGNOSIS — J329 Chronic sinusitis, unspecified: Secondary | ICD-10-CM | POA: Insufficient documentation

## 2016-07-31 DIAGNOSIS — J324 Chronic pansinusitis: Secondary | ICD-10-CM

## 2016-07-31 DIAGNOSIS — R059 Cough, unspecified: Secondary | ICD-10-CM

## 2016-07-31 DIAGNOSIS — J029 Acute pharyngitis, unspecified: Secondary | ICD-10-CM | POA: Diagnosis not present

## 2016-07-31 MED ORDER — AMOXICILLIN-POT CLAVULANATE 875-125 MG PO TABS: 1.0000 | ORAL_TABLET | Freq: Two times a day (BID) | ORAL | 0 refills | Status: DC

## 2016-07-31 MED ORDER — PROMETHAZINE-DM 6.25-15 MG/5ML PO SYRP: 5.0000 mL | ORAL_SOLUTION | Freq: Four times a day (QID) | ORAL | 0 refills | Status: DC | PRN

## 2016-07-31 NOTE — Telephone Encounter (Signed)
augmentin 875 mg bid x 10 days--- if no better by end of week

## 2016-07-31 NOTE — Assessment & Plan Note (Signed)
augmentin con't flonase Refill cough med rto prn

## 2016-07-31 NOTE — Progress Notes (Signed)
Patient ID: Morgan Long, female    DOB: Dec 12, 1970  Age: 46 y.o. MRN: [id number]    Subjective:  Subjective  HPI  Morgan Long presents for cough, congestion and sore throat x 2 days.  Fever 99--- and she is taking ibuprofen -- last dose Saturday.  Pt has had productive cough --- + weak and not herself .  She never got completely better from last visit.   Review of Systems  Constitutional: Positive for chills. Negative for fever.  HENT: Positive for congestion, postnasal drip, rhinorrhea, sinus pain, sinus pressure and sore throat.   Respiratory: Positive for cough and wheezing. Negative for chest tightness and shortness of breath.   Cardiovascular: Negative for chest pain, palpitations and leg swelling.  Gastrointestinal: Negative for diarrhea, nausea and vomiting.  Musculoskeletal: Positive for myalgias. Negative for back pain, neck pain and neck stiffness.  Allergic/Immunologic: Negative for environmental allergies.    History Past Medical History:  Diagnosis Date  . Allergic rhinitis   . Chronic migraine    Dr. Catalina Gravel  . Dry eye syndrome of both lacrimal glands   . Endometriosis   . GERD (gastroesophageal reflux disease)   . Headache(784.0)    occasional, dx w/ Migraines before, Topamax helps  . Nuclear cataract of both eyes    Mild  . Sinusitis   . TMJ pain dysfunction syndrome    occasional    She has a past surgical history that includes fallopian tube removed; Breast enhancement surgery (2006); and endrometroisis.   Her family history includes Breast cancer in her maternal aunt, maternal aunt, maternal aunt, and maternal aunt; Heart disease in her father; Hypertension in her maternal aunt; Pancreatic cancer in her maternal aunt; Throat cancer in her maternal uncle and mother.She reports that she has never smoked. She has never used smokeless tobacco. She reports that she drinks alcohol. She reports that she does not use drugs.  Current Outpatient Prescriptions on  File Prior to Visit  Medication Sig Dispense Refill  . albuterol (PROAIR HFA) 108 (90 Base) MCG/ACT inhaler INHALE TWO PUFFS EVERY 4-6 HOURS IF NEEDED FOR COUGH WHEEZE. 1 Inhaler 1  . AMBULATORY NON FORMULARY MEDICATION Medication Name: Domperidone 10 mg tablets Take one po before meals and at bedtime. 200 capsule 2  . azelastine (ASTELIN) 0.1 % nasal spray Place 2 sprays into both nostrils daily as needed for rhinitis. Reported on 06/03/2015    . beclomethasone (QVAR) 80 MCG/ACT inhaler INHALE TWO PUFFS TWICE DAILY TO PREVENT COUGH OR WHEEZE. RINSE MOUTH AFTER USE. USE WITH SPACER. 1 Inhaler 5  . clindamycin (CLEOCIN T) 1 % external solution     . DRYSOL 20 % external solution Reported on 06/03/2015    . ESTROGEL 0.75 MG/1.25 GM (0.06%) topical gel Apply 1 application topically See admin instructions.  0  . etonogestrel-ethinyl estradiol (NUVARING) 0.12-0.015 MG/24HR vaginal ring Place 1 each vaginally every 28 (twenty-eight) days. Insert vaginally and leave in place for 3 consecutive weeks, then remove for 1 week.     Marland Kitchen FLUOCINOLONE ACETONIDE SCALP 0.01 % OIL Reported on 10/26/2015    . fluocinonide ointment (LIDEX) [id number] % Apply 1 application topically as needed.  3  . fluticasone (FLONASE) 50 MCG/ACT nasal spray Place 2 sprays into both nostrils daily. 16 g 0  . LINZESS 145 MCG CAPS capsule TAKE 1 CAPSULE (145 MCG TOTAL) BY MOUTH DAILY. 30 capsule 0  . LINZESS 72 MCG capsule TAKE 1 CAPSULE (72 MCG TOTAL) BY MOUTH DAILY BEFORE BREAKFAST.  30 capsule 0  . Multiple Vitamin (MULTIVITAMIN) LIQD Take 5 mLs by mouth daily.    . ondansetron (ZOFRAN) 4 MG tablet Take 1 every 6 hours as needed for nausea. 50 tablet 0  . ranitidine (ZANTAC) 150 MG tablet Take 1 tablet (150 mg total) by mouth 2 (two) times daily. 60 tablet 4  . spironolactone (ALDACTONE) 50 MG tablet Take 1 tablet by mouth as needed.  5   No current facility-administered medications on file prior to visit.      Objective:  Objective    Physical Exam  Constitutional: She appears well-developed and well-nourished. No distress.  HENT:  Head: Normocephalic and atraumatic.  Right Ear: Tympanic membrane normal.  Left Ear: Tympanic membrane normal.  Nose: Mucosal edema and rhinorrhea present. Right sinus exhibits maxillary sinus tenderness and frontal sinus tenderness. Left sinus exhibits maxillary sinus tenderness and frontal sinus tenderness.  Mouth/Throat: Uvula is midline and mucous membranes are normal. Posterior oropharyngeal erythema present. No oropharyngeal exudate.  Eyes: Conjunctivae and EOM are normal. Pupils are equal, round, and reactive to light.  Neck: Normal range of motion. Neck supple.  Cardiovascular: Normal rate, regular rhythm and normal heart sounds.   Pulmonary/Chest: Effort normal and breath sounds normal. No respiratory distress. She has no wheezes.  Lymphadenopathy:    She has no cervical adenopathy.  Nursing note and vitals reviewed.  BP 120/78 (BP Location: Left Arm, Patient Position: Sitting, Cuff Size: Normal)   Pulse 88   Temp 98.9 F (37.2 C) (Oral)   Resp 16   Ht 5\' 2"  (1.575 m)   Wt 199 lb 9.6 oz (90.5 kg)   LMP 07/07/2016   SpO2 97%   BMI 36.51 kg/m  Wt Readings from Last 3 Encounters:  07/31/16 199 lb 9.6 oz (90.5 kg)  07/11/16 199 lb 6.4 oz (90.4 kg)  07/06/16 199 lb 8 oz (90.5 kg)     Lab Results  Component Value Date   WBC 11.4 (H) 03/19/2015   HGB 13.1 03/19/2015   HCT 38.9 03/19/2015   PLT 377 03/19/2015   GLUCOSE 82 03/19/2015   CHOL 199 05/14/2014   TRIG 61.0 05/14/2014   HDL 75.40 05/14/2014   LDLCALC 111 (H) 05/14/2014   ALT 30 05/14/2014   AST 21 05/14/2014   NA 137 03/19/2015   K 3.7 03/19/2015   CL 108 03/19/2015   CREATININE 0.90 03/19/2015   BUN 16 03/19/2015   CO2 22 03/19/2015   TSH 1.26 05/14/2014   HGBA1C 5.6 06/09/2015    Dg Chest 2 View  Result Date: 07/14/2016 CLINICAL DATA:  Cough and congestion EXAM: CHEST  2 VIEW COMPARISON:   03/19/2015 FINDINGS: The heart size and mediastinal contours are within normal limits. Both lungs are clear. The visualized skeletal structures are unremarkable. IMPRESSION: No active cardiopulmonary disease. Electronically Signed   By: Inez Catalina M.D.   On: 07/14/2016 15:50   Flu and strep done-- neg    Assessment & Plan:  Plan  I have discontinued Ms. Robinson's oseltamivir. I am also having her maintain her etonogestrel-ethinyl estradiol, multivitamin, azelastine, DRYSOL, clindamycin, Fluocinolone Acetonide Scalp, fluocinonide ointment, ESTROGEL, spironolactone, albuterol, beclomethasone, AMBULATORY NON FORMULARY MEDICATION, LINZESS, LINZESS, ondansetron, ranitidine, fluticasone, amoxicillin-clavulanate, and promethazine-dextromethorphan. We will stop administering predniSONE.  Meds ordered this encounter  Medications  . promethazine-dextromethorphan (PROMETHAZINE-DM) 6.25-15 MG/5ML syrup    Sig: Take 5 mLs by mouth 4 (four) times daily as needed for cough.    Dispense:  118 mL    Refill:  0    Problem List Items Addressed This Visit      Unprioritized   Sinusitis - Primary    augmentin con't flonase Refill cough med rto prn       Relevant Medications   promethazine-dextromethorphan (PROMETHAZINE-DM) 6.25-15 MG/5ML syrup    Other Visit Diagnoses    Sore throat       Relevant Orders   POCT rapid strep A   Needs flu shot       Relevant Orders   Flu Vaccine QUAD 36+ mos PF IM (Fluarix & Fluzone Quad PF)   Cough       Relevant Medications   promethazine-dextromethorphan (PROMETHAZINE-DM) 6.25-15 MG/5ML syrup      Follow-up: Return if symptoms worsen or fail to improve.  [name], DO

## 2016-07-31 NOTE — Progress Notes (Signed)
Pre visit review using our clinic review tool, if applicable. No additional management support is needed unless otherwise documented below in the visit note. 

## 2016-07-31 NOTE — Patient Instructions (Signed)

## 2016-08-01 ENCOUNTER — Telehealth: Payer: Self-pay

## 2016-08-01 DIAGNOSIS — G43719 Chronic migraine without aura, intractable, without status migrainosus: Secondary | ICD-10-CM | POA: Diagnosis not present

## 2016-08-01 LAB — POCT RAPID STREP A (OFFICE): Rapid Strep A Screen: NEGATIVE

## 2016-08-01 LAB — POC INFLUENZA A&B (BINAX/QUICKVUE)
INFLUENZA B, POC: NEGATIVE
Influenza A, POC: NEGATIVE

## 2016-08-02 NOTE — Telephone Encounter (Signed)
Duplicate note

## 2016-08-03 ENCOUNTER — Encounter: Payer: Self-pay | Admitting: Physical Therapy

## 2016-08-03 ENCOUNTER — Ambulatory Visit: Payer: BLUE CROSS/BLUE SHIELD | Attending: Family Medicine | Admitting: Physical Therapy

## 2016-08-03 DIAGNOSIS — M25571 Pain in right ankle and joints of right foot: Secondary | ICD-10-CM

## 2016-08-03 DIAGNOSIS — M25572 Pain in left ankle and joints of left foot: Secondary | ICD-10-CM | POA: Diagnosis not present

## 2016-08-03 DIAGNOSIS — R600 Localized edema: Secondary | ICD-10-CM | POA: Insufficient documentation

## 2016-08-03 DIAGNOSIS — M6281 Muscle weakness (generalized): Secondary | ICD-10-CM | POA: Diagnosis not present

## 2016-08-03 NOTE — Telephone Encounter (Signed)
Please fix it

## 2016-08-03 NOTE — Therapy (Signed)
[facility] [address] [place], [place], [address] Phone: [phone]   Fax:  [phone]  Physical Therapy Treatment  Patient Details  Name: Morgan Long MRN: [id number] Date of Birth: 08/29/1970 Referring Provider: Dr. Tamala Julian  Encounter Date: 08/03/2016      PT End of Session - 08/03/16 1618    Visit Number 22   Number of Visits 29   Date for PT Re-Evaluation 10/03/16   Authorization Type 30 visit limit --1 used already   PT Start Time [id number]   PT Stop Time 1705   PT Time Calculation (min) 51 min   Activity Tolerance Patient limited by pain   Behavior During Therapy [facility] for tasks assessed/performed      Past Medical History:  Diagnosis Date  . Allergic rhinitis   . Chronic migraine    Dr. Catalina Gravel  . Dry eye syndrome of both lacrimal glands   . Endometriosis   . GERD (gastroesophageal reflux disease)   . Headache(784.0)    occasional, dx w/ Migraines before, Topamax helps  . Nuclear cataract of both eyes    Mild  . Sinusitis   . TMJ pain dysfunction syndrome    occasional    Past Surgical History:  Procedure Laterality Date  . BREAST ENHANCEMENT SURGERY  2006  . endrometroisis    . fallopian tube removed     Left    There were no vitals filed for this visit.      Subjective Assessment - 08/03/16 1615    Subjective Pt reports she tried to wear ankle braces, they were supportive but hurt the back of the heel. Pt is also going to be getting arch supports for feet.    Pertinent History left bunionectomy   Limitations Walking   How long can you walk comfortably? 20-30 minutes   Diagnostic tests x-ray after fall, MRI   Patient Stated Goals get back to regular ex regimen, stop swelling;    Currently in Pain? Yes   Pain Score 3    Pain Location Ankle   Pain Orientation Right;Left   Pain Descriptors / Indicators Aching;Sharp   Pain Type Chronic pain   Pain Onset More than a month ago   Pain  Frequency Intermittent   Aggravating Factors  walking   Pain Relieving Factors elevation   Effect of Pain on Daily Activities can't exercise   Multiple Pain Sites No                         [facility] Adult PT Treatment/Exercise - 08/03/16 0001      Neuro Re-ed    Neuro Re-ed Details  Standing on blue foam balance  Weight shifting     Knee/Hip Exercises: Standing   Lateral Step Up 1 set;Both;10 reps;Step Height: 4";Hand Hold: 1   Forward Step Up Step Height: 4";Right;Left;1 set;20 reps;Hand Hold: 1  onto Bosu   Walking with Sports Cord 5x #20 back, forward     Vasopneumatic   Number Minutes Vasopneumatic  15 minutes   Vasopnuematic Location  Ankle   Vasopneumatic Pressure High   Vasopneumatic Temperature  3 snow flakes     Ankle Exercises: Machines for Strengthening   Cybex Leg Press 3x10 #90 Bil; 3x10 single leg #50     Ankle Exercises: Aerobic   Stationary Bike Nu-Step L3 10 min LE only  Therapist present to discuss treatment     Ankle Exercises: Stretches   Gastroc  Stretch 2 reps;10 seconds  slant board                  PT Short Term Goals - 08/03/16 1618      PT SHORT TERM GOAL #3   Title The patient will have improved bilateral ankle dorsiflexion to 6 degrees needed for ROM to ascend and descend stairs   Time 4   Period Weeks   Status On-going           PT Long Term Goals - 08/03/16 1619      PT LONG TERM GOAL #1   Title The patient will be independent in safe self progression of HEP for further improvements in ROM and strength  07/28/16   Time 10   Period Weeks   Status On-going     PT LONG TERM GOAL #2   Title The patient will report a 60% improvement in pain and swelling with home and work ADLS   Time 10   Period Weeks   Status On-going               Plan - 08/03/16 1620    Clinical Impression Statement Pt reports pain has improved but continues to have hesitancy and discomfort with exercises especially in single  leg stance or on uneven surfaces. Pt able to tolerate all exercises well. Pt reports having increased swelling, continued with vasoneumatic device today for swelling.  Pt is seeing a foot and ankle specialist who has recomended braces and arch supports. Pt had heel pain with braces and has not yet recieved arch supports. Pt will continue to benefit from skilled therapy for ankle strength and stability.    Rehab Potential Good   PT Frequency 1x / week   PT Duration 12 weeks   PT Treatment/Interventions ADLs/Self Care Home Management;Cryotherapy;Electrical Stimulation;Iontophoresis 4mg /ml Dexamethasone;Ultrasound;Therapeutic exercise;Patient/family education;Manual techniques;Dry needling;Taping;Vasopneumatic Device   PT Next Visit Plan standing, balance, single leg strengthening   Consulted and Agree with Plan of Care Patient      Patient will benefit from skilled therapeutic intervention in order to improve the following deficits and impairments:  Pain, Increased edema, Decreased strength, Decreased range of motion  Visit Diagnosis: Pain in right ankle and joints of right foot  Pain in left ankle and joints of left foot  Localized edema  Muscle weakness (generalized)     Problem List Patient Active Problem List   Diagnosis Date Noted  . Sinusitis 07/31/2016  . Influenza-like illness 07/12/2016  . Peroneal tendinitis of left lower extremity 11/03/2015  . Pes anserine bursitis 11/03/2015  . Asthma with acute exacerbation 06/15/2015  . Acute sinusitis 06/15/2015  . PCP NOTES >>>> 02/23/2015  . GERD (gastroesophageal reflux disease) 01/13/2015  . Myalgia and myositis 07/22/2014  . Paresthesias 06/02/2014  . Acute pharyngitis 05/20/2014  . Strep pharyngitis 05/20/2014  . Degenerative cervical disc 10/28/2013  . Bursitis, shoulder 09/17/2013  . Muscle spasm of back 09/11/2013  . Nonallopathic lesion of thoracic region 09/11/2013  . General medical examination 03/28/2011  .  Allergic rhinitis   . TMJ PAIN 12/16/2007  . Headache(784.0) 12/16/2007    Mikle Bosworth PTA 08/03/2016, 5:09 PM  [facility] [address] [place], [place], [address] Phone: [phone]   Fax:  [phone]  Name: Morgan Long MRN: [id number] Date of Birth: [birth year]

## 2016-08-04 ENCOUNTER — Ambulatory Visit (HOSPITAL_BASED_OUTPATIENT_CLINIC_OR_DEPARTMENT_OTHER)
Admission: RE | Admit: 2016-08-04 | Discharge: 2016-08-04 | Disposition: A | Discharge status: Home / Self Care | Payer: BLUE CROSS/BLUE SHIELD | Source: Ambulatory Visit | Attending: Family Medicine | Admitting: Family Medicine

## 2016-08-04 ENCOUNTER — Other Ambulatory Visit: Payer: Self-pay | Admitting: Family Medicine

## 2016-08-04 ENCOUNTER — Encounter: Payer: Self-pay | Admitting: Family Medicine

## 2016-08-04 DIAGNOSIS — R079 Chest pain, unspecified: Secondary | ICD-10-CM | POA: Diagnosis not present

## 2016-08-04 DIAGNOSIS — R059 Cough, unspecified: Secondary | ICD-10-CM

## 2016-08-04 DIAGNOSIS — R0789 Other chest pain: Secondary | ICD-10-CM | POA: Diagnosis not present

## 2016-08-04 DIAGNOSIS — R05 Cough: Secondary | ICD-10-CM | POA: Diagnosis not present

## 2016-08-04 NOTE — Telephone Encounter (Signed)
With the cough and chest heaviness we should get a cxr --- we can put order in but if she feels she can not get to radiology by 5 or if she needs to be seen we can get her into [facility] --- they will not be able to do xray but they can reexamine her.

## 2016-08-05 ENCOUNTER — Ambulatory Visit (INDEPENDENT_AMBULATORY_CARE_PROVIDER_SITE_OTHER): Payer: BLUE CROSS/BLUE SHIELD | Admitting: Family Medicine

## 2016-08-05 ENCOUNTER — Encounter: Payer: Self-pay | Admitting: Family Medicine

## 2016-08-05 VITALS — BP 100/72 | HR 72 | Temp 99.0°F | Resp 14 | Ht 62.0 in | Wt 198.0 lb

## 2016-08-05 DIAGNOSIS — R05 Cough: Secondary | ICD-10-CM

## 2016-08-05 DIAGNOSIS — J0140 Acute pansinusitis, unspecified: Secondary | ICD-10-CM

## 2016-08-05 DIAGNOSIS — R059 Cough, unspecified: Secondary | ICD-10-CM

## 2016-08-05 DIAGNOSIS — R0789 Other chest pain: Secondary | ICD-10-CM | POA: Diagnosis not present

## 2016-08-05 MED ORDER — BENZONATATE 100 MG PO CAPS: 100.0000 mg | ORAL_CAPSULE | Freq: Three times a day (TID) | ORAL | 0 refills | Status: DC | PRN

## 2016-08-05 NOTE — Patient Instructions (Signed)
It was a pleasure to see you today.   Please continue your antibiotic and liquid cough medicine at night.  I have sent in a daytime cough pill.  Take plain mucinex and use your inhaler as we discussed.

## 2016-08-05 NOTE — Progress Notes (Signed)
Pre visit review using our clinic review tool, if applicable. No additional management support is needed unless otherwise documented below in the visit note. 

## 2016-08-05 NOTE — Progress Notes (Signed)
Subjective:    Patient ID: Morgan Long, female    DOB: 05-18-1971, 46 y.o.   MRN: [id number]  HPI This is a 46 yo female who presents today with worsening cough, small amounts yellow and tan sputum. Little nasal drainage, bright yellow. Coughing worse at night when she lies down, having a lot of thick post nasal drainage. Promethazine with codeine provides some reliefLow grade fevers. Has albuterol at home, has used infrequently. Not taking mucinex. Pain in mid back and upper chest with deep breath and cough. Has taken ibuprofen off and on, none in the last two days. Was seen 07/31/16 and diagnosed with pansinusitis, was given augmentin, instructed to continue flonase. Has had decreased nasal drainage and less pressure. She called into her PCP yesterday who ordered a CXR which the patient had last night. No SOB, occasional wheeze.   Past Medical History:  Diagnosis Date  . Allergic rhinitis   . Chronic migraine    Dr. Catalina Gravel  . Dry eye syndrome of both lacrimal glands   . Endometriosis   . GERD (gastroesophageal reflux disease)   . Headache(784.0)    occasional, dx w/ Migraines before, Topamax helps  . Nuclear cataract of both eyes    Mild  . Sinusitis   . TMJ pain dysfunction syndrome    occasional   Past Surgical History:  Procedure Laterality Date  . BREAST ENHANCEMENT SURGERY  2006  . endrometroisis    . fallopian tube removed     Left      Review of Systems  Constitutional: Positive for fever (low grade).  HENT: Positive for postnasal drip and rhinorrhea. Negative for ear pain and sinus pain.   Respiratory: Positive for cough, chest tightness and wheezing. Negative for stridor.   Cardiovascular: Positive for chest pain (right upper anterior).  Musculoskeletal: Positive for back pain (right, upper).  Neurological: Negative for headaches.      Objective:   Physical Exam  Constitutional: She is oriented to person, place, and time. She appears well-developed and  well-nourished. No distress.  HENT:  Head: Normocephalic and atraumatic.  Right Ear: Tympanic membrane, external ear and ear canal normal.  Left Ear: External ear normal.  Nose: Nose normal. Right sinus exhibits no maxillary sinus tenderness and no frontal sinus tenderness. Left sinus exhibits no maxillary sinus tenderness and no frontal sinus tenderness.  Mouth/Throat: Uvula is midline. No posterior oropharyngeal edema or posterior oropharyngeal erythema.  Moderate amount post nasal drainage.   Neck: Normal range of motion. Neck supple.  Cardiovascular: Normal rate, regular rhythm and normal heart sounds.   Pulmonary/Chest: Effort normal and breath sounds normal. No respiratory distress. She has no wheezes. She has no rales. She exhibits tenderness (right upper anterior and posterior).  Lymphadenopathy:    She has no cervical adenopathy.  Neurological: She is alert and oriented to person, place, and time.  Skin: Skin is warm and dry. She is not diaphoretic.  Psychiatric: She has a normal mood and affect. Her behavior is normal. Judgment and thought content normal.  Wore sunglasses throughout visit.   Vitals reviewed. Dg Chest 2 View  Result Date: 08/05/2016 CLINICAL DATA:  Acute onset of cough, congestion and left-sided chest pain. Low-grade fever. Nausea and gagging. Initial encounter. EXAM: CHEST  2 VIEW COMPARISON:  Chest radiograph performed 07/14/2016 FINDINGS: The lungs are well-aerated. Minimal central haziness is relatively stable from prior studies and thought to reflect overlying soft tissues. There is no evidence of focal opacification, pleural effusion or  pneumothorax. The heart is normal in size; the mediastinal contour is within normal limits. No acute osseous abnormalities are seen. IMPRESSION: No acute cardiopulmonary process seen. Electronically Signed   By: Garald Balding M.D.   On: 08/05/2016 00:52   BP 100/72 (BP Location: Left Arm, Patient Position: Sitting, Cuff Size:  Large)   Pulse 72   Temp 99 F (37.2 C) (Oral)   Resp 14   Ht 5\' 2"  (1.575 m)   Wt 198 lb (89.8 kg)   LMP 07/07/2016   SpO2 97%   BMI 36.21 kg/m  Wt Readings from Last 3 Encounters:  08/05/16 198 lb (89.8 kg)  07/31/16 199 lb 9.6 oz (90.5 kg)  07/11/16 199 lb 6.4 oz (90.4 kg)       Assessment & Plan:  1. Cough - Normal CXR and physical exam - finish augmentin as prescribed - add mucinex, use albuterol inhaler, increase fluids - for daytime cough- benzonatate (TESSALON) 100 MG capsule; Take 1-2 capsules (100-200 mg total) by mouth 3 (three) times daily as needed for cough.  Dispense: 30 capsule; Refill: 0  2. Acute non-recurrent pansinusitis - finish augmentin as prescribed - add mucinex, continue saline rinses  3. Musculoskeletal chest pain - can take ibuprofen 400 mg BID, apply heat  Clarene Reamer, FNP-BC   [facility]  08/05/2016 12:52 PM

## 2016-08-07 DIAGNOSIS — G43719 Chronic migraine without aura, intractable, without status migrainosus: Secondary | ICD-10-CM | POA: Diagnosis not present

## 2016-08-08 ENCOUNTER — Encounter: Payer: Self-pay | Admitting: Physical Therapy

## 2016-08-08 ENCOUNTER — Ambulatory Visit: Payer: BLUE CROSS/BLUE SHIELD | Admitting: Physical Therapy

## 2016-08-08 DIAGNOSIS — M25571 Pain in right ankle and joints of right foot: Secondary | ICD-10-CM | POA: Diagnosis not present

## 2016-08-08 DIAGNOSIS — M25572 Pain in left ankle and joints of left foot: Secondary | ICD-10-CM | POA: Diagnosis not present

## 2016-08-08 DIAGNOSIS — R600 Localized edema: Secondary | ICD-10-CM | POA: Diagnosis not present

## 2016-08-08 DIAGNOSIS — M6281 Muscle weakness (generalized): Secondary | ICD-10-CM

## 2016-08-08 NOTE — Therapy (Signed)
[facility] [address] [place], [place], [address] Phone: [phone]   Fax:  [phone]  Physical Therapy Treatment  Patient Details  Name: Morgan Long MRN: [id number] Date of Birth: [birth year] Referring Provider: Dr. Tamala Julian  Encounter Date: 08/08/2016      PT End of Session - 08/08/16 1618    Visit Number 23   Number of Visits 29   Date for PT Re-Evaluation 10/03/16   Authorization Type 30 visit limit --1 used already   PT Start Time 1615   PT Stop Time 1702   PT Time Calculation (min) 47 min   Activity Tolerance Patient limited by pain   Behavior During Therapy [facility] for tasks assessed/performed      Past Medical History:  Diagnosis Date  . Allergic rhinitis   . Chronic migraine    Dr. Catalina Gravel  . Dry eye syndrome of both lacrimal glands   . Endometriosis   . GERD (gastroesophageal reflux disease)   . Headache(784.0)    occasional, dx w/ Migraines before, Topamax helps  . Nuclear cataract of both eyes    Mild  . Sinusitis   . TMJ pain dysfunction syndrome    occasional    Past Surgical History:  Procedure Laterality Date  . BREAST ENHANCEMENT SURGERY  2006  . endrometroisis    . fallopian tube removed     Left    There were no vitals filed for this visit.      Subjective Assessment - 08/08/16 1617    Subjective Pt reports doing ok, having miagrain. Trying to wear the braces more.    Pertinent History left bunionectomy   Limitations Walking   How long can you walk comfortably? 20-30 minutes   Diagnostic tests x-ray after fall, MRI   Patient Stated Goals get back to regular ex regimen, stop swelling;    Currently in Pain? Yes   Pain Score 4    Pain Location Ankle   Pain Orientation Right;Left   Pain Descriptors / Indicators Aching;Sharp   Pain Type Chronic pain   Pain Onset More than a month ago   Pain Frequency Intermittent   Aggravating Factors  walking   Pain Relieving Factors  elevation   Effect of Pain on Daily Activities can't exercise   Multiple Pain Sites No                         [facility] Adult PT Treatment/Exercise - 08/08/16 0001      Neuro Re-ed    Neuro Re-ed Details  Standing on rebounder balance  Weight shifting and SLS     Knee/Hip Exercises: Aerobic   Nustep --     Knee/Hip Exercises: Standing   Forward Step Up Step Height: 4";Right;Left;1 set;20 reps;Hand Hold: 1  onto Bosu   Walking with Sports Cord 5x #20 back, forward     Modalities   Modalities Vasopneumatic     Vasopneumatic   Number Minutes Vasopneumatic  15 minutes   Vasopnuematic Location  Ankle   Vasopneumatic Pressure High   Vasopneumatic Temperature  3 snow flakes     Ankle Exercises: Machines for Strengthening   Cybex Leg Press 3x10 #90 Bil; 3x10 single leg #50                  PT Short Term Goals - 08/08/16 1618      PT SHORT TERM GOAL #3   Title The patient will have  improved bilateral ankle dorsiflexion to 6 degrees needed for ROM to ascend and descend stairs   Baseline 2 Lt 4 Rt   Time 4   Period Weeks   Status On-going           PT Long Term Goals - 08/08/16 1619      PT LONG TERM GOAL #1   Title The patient will be independent in safe self progression of HEP for further improvements in ROM and strength  07/28/16   Time 10   Period Weeks     PT LONG TERM GOAL #2   Title The patient will report a 60% improvement in pain and swelling with home and work ADLS   Time 10   Period Weeks   Status On-going     PT LONG TERM GOAL #3   Title The patient will have improved ankle plantarflexion to 50 degrees, dorsiflexion to 8 degrees, eversion to 14 degrees needed for ambulation on uneven surfaces   Time 10   Period Weeks   Status On-going     PT LONG TERM GOAL #4   Title The patient will be able to walk > 1/2 mile for community mobility    Time 10   Period Weeks   Status On-going               Plan - 08/08/16 1649     Clinical Impression Statement Pt continues to make slow progress with ankle strength and stability. Continues to be challenged by current level of exercises and resistance. Pt having "poping" in Lt ankle with all exercises. Pt will continue to benefit from skilled therapy for ankle strength and stability.    Rehab Potential Good   PT Frequency 1x / week   PT Duration 12 weeks   PT Treatment/Interventions ADLs/Self Care Home Management;Cryotherapy;Electrical Stimulation;Iontophoresis 4mg /ml Dexamethasone;Ultrasound;Therapeutic exercise;Patient/family education;Manual techniques;Dry needling;Taping;Vasopneumatic Device   PT Next Visit Plan standing, balance, single leg strengthening   Consulted and Agree with Plan of Care Patient      Patient will benefit from skilled therapeutic intervention in order to improve the following deficits and impairments:  Pain, Increased edema, Decreased strength, Decreased range of motion  Visit Diagnosis: Pain in right ankle and joints of right foot  Pain in left ankle and joints of left foot  Localized edema  Muscle weakness (generalized)     Problem List Patient Active Problem List   Diagnosis Date Noted  . Sinusitis 07/31/2016  . Influenza-like illness 07/12/2016  . Peroneal tendinitis of left lower extremity 11/03/2015  . Pes anserine bursitis 11/03/2015  . Asthma with acute exacerbation 06/15/2015  . Acute sinusitis 06/15/2015  . PCP NOTES >>>> 02/23/2015  . GERD (gastroesophageal reflux disease) 01/13/2015  . Myalgia and myositis 07/22/2014  . 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 Paresthesias 06/02/2014  . Acute pharyngitis 05/20/2014  . Strep pharyngitis 05/20/2014  . Degenerative cervical disc 10/28/2013  . Bursitis, shoulder 09/17/2013  . Muscle spasm of back 09/11/2013  . Nonallopathic lesion of thoracic region 09/11/2013  . General medical examination 03/28/2011  . Allergic rhinitis   . TMJ PAIN 12/16/2007  . Headache(784.0)  12/16/2007    Mikle Bosworth PTA 08/08/2016, 5:04 PM  [facility] [address] [place], [place], [address] Phone: [phone]   Fax:  [phone]  Name: SARAIYAH SALVATORI MRN: [id number] Date of Birth: 30-Nov-1970

## 2016-08-15 ENCOUNTER — Encounter: Payer: Self-pay | Admitting: Physical Therapy

## 2016-08-15 ENCOUNTER — Ambulatory Visit: Payer: BLUE CROSS/BLUE SHIELD | Admitting: Physical Therapy

## 2016-08-15 DIAGNOSIS — M25571 Pain in right ankle and joints of right foot: Secondary | ICD-10-CM

## 2016-08-15 DIAGNOSIS — R600 Localized edema: Secondary | ICD-10-CM

## 2016-08-15 DIAGNOSIS — M25572 Pain in left ankle and joints of left foot: Secondary | ICD-10-CM | POA: Diagnosis not present

## 2016-08-15 DIAGNOSIS — M6281 Muscle weakness (generalized): Secondary | ICD-10-CM

## 2016-08-15 NOTE — Therapy (Signed)
[facility] [address], [place], [place], [address] Phone: [phone]   Fax:  [phone]  Physical Therapy Treatment  Patient Details  Name: Morgan Long MRN: [id number] Date of Birth: May 23, 1971 Referring Provider: Dr. Tamala Julian  Encounter Date: 08/15/2016      PT End of Session - 08/15/16 1621    Visit Number 24   Number of Visits 29   Date for PT Re-Evaluation 10/03/16   Authorization Type 30 visit limit --1 used already   PT Start Time 1615   PT Stop Time 1711   PT Time Calculation (min) 56 min   Activity Tolerance Patient limited by pain   Behavior During Therapy [facility] for tasks assessed/performed      Past Medical History:  Diagnosis Date  . Allergic rhinitis   . Chronic migraine    Dr. Catalina Gravel  . Dry eye syndrome of both lacrimal glands   . Endometriosis   . GERD (gastroesophageal reflux disease)   . Headache(784.0)    occasional, dx w/ Migraines before, Topamax helps  . Nuclear cataract of both eyes    Mild  . Sinusitis   . TMJ pain dysfunction syndrome    occasional    Past Surgical History:  Procedure Laterality Date  . BREAST ENHANCEMENT SURGERY  2006  . endrometroisis    . fallopian tube removed     Left    There were no vitals filed for this visit.      Subjective Assessment - 08/15/16 1620    Subjective Ankes doing ok but continued popping especially with stairs. Sometimes the popping hurts, sometimes it doesn't.    Pertinent History left bunionectomy   Limitations Walking   How long can you walk comfortably? 20-30 minutes   Diagnostic tests x-ray after fall, MRI   Patient Stated Goals get back to regular ex regimen, stop swelling;    Currently in Pain? Yes   Pain Score 4    Pain Location Ankle   Pain Orientation Right;Left   Pain Descriptors / Indicators Aching;Sharp   Pain Type Chronic pain   Pain Onset More than a month ago   Pain Frequency Intermittent                          [facility] Adult PT Treatment/Exercise - 08/15/16 0001      Knee/Hip Exercises: Standing   Lateral Step Up Both;2 sets;10 reps  onto BOSU   Forward Step Up Right;Left;1 set;20 reps;Hand Hold: 1  onto Bosu   Walking with Sports Cord 5x #20 back, forward   Other Standing Knee Exercises --     Modalities   Modalities Vasopneumatic     Vasopneumatic   Number Minutes Vasopneumatic  15 minutes   Vasopnuematic Location  Ankle   Vasopneumatic Pressure High   Vasopneumatic Temperature  3 snow flakes     Ankle Exercises: Machines for Strengthening   Cybex Leg Press 3x10 #100 Bil; 3x10 single leg #60     Ankle Exercises: Standing   BAPS Sitting;Level 3     Ankle Exercises: Aerobic   Stationary Bike Nu-Step L3 10 min LE only  Therapist present to discuss treatment                  PT Short Term Goals - 08/15/16 1621      PT SHORT TERM GOAL #3   Title The patient will have improved bilateral ankle dorsiflexion to 6  degrees needed for ROM to ascend and descend stairs   Time 4   Period Weeks   Status On-going           PT Long Term Goals - 08/15/16 1621      PT LONG TERM GOAL #3   Title The patient will have improved ankle plantarflexion to 50 degrees, dorsiflexion to 8 degrees, eversion to 14 degrees needed for ambulation on uneven surfaces   Time 10   Period Weeks   Status On-going     PT LONG TERM GOAL #5   Title Ankle strength grossly 4/5 needed for standing/walking longer periods of time   Time 10   Period Weeks   Status On-going               Plan - 08/15/16 1658    Clinical Impression Statement Pt reports feeling that she is continueing to make progress. Popping in Lt ankle has gooten more frequent and more painful per pt. Pt reports having minimal Lt knee pain, possibly fibia instability causing popping. Pt will continue to benefit from skilled therapy for ankle strength and stability.    Rehab Potential Good    PT Frequency 1x / week   PT Duration 12 weeks   PT Treatment/Interventions ADLs/Self Care Home Management;Cryotherapy;Electrical Stimulation;Iontophoresis 4mg /ml Dexamethasone;Ultrasound;Therapeutic exercise;Patient/family education;Manual techniques;Dry needling;Taping;Vasopneumatic Device   PT Next Visit Plan [facility] tapping for balance and stability    Consulted and Agree with Plan of Care Patient      Patient will benefit from skilled therapeutic intervention in order to improve the following deficits and impairments:  Pain, Increased edema, Decreased strength, Decreased range of motion  Visit Diagnosis: Pain in right ankle and joints of right foot  Pain in left ankle and joints of left foot  Localized edema  Muscle weakness (generalized)     Problem List Patient Active Problem List   Diagnosis Date Noted  . Sinusitis 07/31/2016  . Influenza-like illness 07/12/2016  . Peroneal tendinitis of left lower extremity 11/03/2015  . Pes anserine bursitis 11/03/2015  . Asthma with acute exacerbation 06/15/2015  . Acute sinusitis 06/15/2015  . PCP NOTES >>>> 02/23/2015  . GERD (gastroesophageal reflux disease) 01/13/2015  . Myalgia and myositis 07/22/2014  . 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 Paresthesias 06/02/2014  . Acute pharyngitis 05/20/2014  . Strep pharyngitis 05/20/2014  . Degenerative cervical disc 10/28/2013  . Bursitis, shoulder 09/17/2013  . Muscle spasm of back 09/11/2013  . Nonallopathic lesion of thoracic region 09/11/2013  . General medical examination 03/28/2011  . Allergic rhinitis   . TMJ PAIN 12/16/2007  . Headache(784.0) 12/16/2007    Mikle Bosworth PTA 08/15/2016, 5:04 PM  [facility] [address], [place], [place], [address] Phone: [phone]   Fax:  [phone]  Name: Morgan Long MRN: [id number] Date of Birth: [birth year]

## 2016-08-22 ENCOUNTER — Ambulatory Visit: Payer: BLUE CROSS/BLUE SHIELD | Admitting: Physical Therapy

## 2016-08-22 DIAGNOSIS — M6281 Muscle weakness (generalized): Secondary | ICD-10-CM

## 2016-08-22 DIAGNOSIS — M25571 Pain in right ankle and joints of right foot: Secondary | ICD-10-CM

## 2016-08-22 DIAGNOSIS — M25572 Pain in left ankle and joints of left foot: Secondary | ICD-10-CM

## 2016-08-22 DIAGNOSIS — R600 Localized edema: Secondary | ICD-10-CM

## 2016-08-22 NOTE — Therapy (Signed)
[facility] [address] [place], [place], [address] Phone: [phone]   Fax:  [phone]  Physical Therapy Treatment  Patient Details  Name: Morgan Long MRN: [id number] Date of Birth: 23-Apr-1971 Referring Provider: Dr. Tamala Julian  Encounter Date: 08/22/2016      PT End of Session - 08/22/16 1701    Visit Number 25   Number of Visits 29   Date for PT Re-Evaluation 10/03/16   Authorization Type 30 visit limit --1 used already   PT Start Time 2355   PT Stop Time 1712   PT Time Calculation (min) 55 min   Activity Tolerance Patient limited by pain   Behavior During Therapy [facility] for tasks assessed/performed      Past Medical History:  Diagnosis Date  . Allergic rhinitis   . Chronic migraine    Dr. Catalina Gravel  . Dry eye syndrome of both lacrimal glands   . Endometriosis   . GERD (gastroesophageal reflux disease)   . Headache(784.0)    occasional, dx w/ Migraines before, Topamax helps  . Nuclear cataract of both eyes    Mild  . Sinusitis   . TMJ pain dysfunction syndrome    occasional    Past Surgical History:  Procedure Laterality Date  . BREAST ENHANCEMENT SURGERY  2006  . endrometroisis    . fallopian tube removed     Left    There were no vitals filed for this visit.      Subjective Assessment - 08/22/16 1625    Subjective Left ankle popped Thursday or friday and has been having more pain and wearing brace since then.  Now pain is 6 on left and 5 on the right.   Pertinent History left bunionectomy   Limitations Walking   Currently in Pain? Yes   Pain Score 6    Pain Location Ankle   Pain Orientation Right;Left   Pain Type Chronic pain   Pain Onset More than a month ago   Pain Frequency Intermittent   Aggravating Factors  walking   Pain Relieving Factors elevation   Effect of Pain on Daily Activities can't exercise   Multiple Pain Sites No            [facility] PT Assessment - 08/22/16 0001       Strength   Right Ankle Dorsiflexion 4-/5   Right Ankle Plantar Flexion 4-/5   Right Ankle Inversion 4-/5   Right Ankle Eversion 4-/5   Left Ankle Dorsiflexion 3/5   Left Ankle Inversion 3/5   Left Ankle Eversion 3/5                     [facility] Adult PT Treatment/Exercise - 08/22/16 0001      Knee/Hip Exercises: Machines for Strengthening   Cybex Leg Press --     Knee/Hip Exercises: Supine   Straight Leg Raises Strengthening;Both;2 sets;10 reps  hip series 4 ways 2# ankle weights     Ankle Exercises: Standing   Rocker Board 2 minutes     Ankle Exercises: Machines for Strengthening   Cybex Leg Press bilateral 90# 3x10; single leg 65# 3x10                  PT Short Term Goals - 08/22/16 1711      PT SHORT TERM GOAL #3   Title The patient will have improved bilateral ankle dorsiflexion to 6 degrees needed for ROM to ascend and descend stairs  Time 4   Period Weeks   Status On-going           PT Long Term Goals - 08/22/16 1713      PT LONG TERM GOAL #1   Title The patient will be independent in safe self progression of HEP for further improvements in ROM and strength  07/28/16   Time 10   Period Weeks   Status On-going     PT LONG TERM GOAL #4   Title The patient will be able to walk > 1/2 mile for community mobility    Time 10   Period Weeks   Status On-going     PT LONG TERM GOAL #5   Title Ankle strength grossly 4/5 needed for standing/walking longer periods of time   Time 10   Period Weeks   Status On-going     PT LONG TERM GOAL #6   Title FOTO functional outcome score improved from 63% to 42% indicating improved function with less pain   Baseline 59%   Time 10   Period Weeks   Status On-going               Plan - 08/22/16 1703    Clinical Impression Statement Pt had more pain and weakness in left ankle today since her pain increased last week.  Patient unable to tolerate as much weight bearing so focused on LE  exercises to tolerance.  Pt continues to need skilled PT due to weakness and instability.     Rehab Potential Good   PT Treatment/Interventions ADLs/Self Care Home Management;Cryotherapy;Electrical Stimulation;Iontophoresis 4mg /ml Dexamethasone;Ultrasound;Therapeutic exercise;Patient/family education;Manual techniques;Dry needling;Taping;Vasopneumatic Device   PT Next Visit Plan f/u on doctor visit, ankle strengthening progressing to more single leg standing as tolerated   Consulted and Agree with Plan of Care Patient      Patient will benefit from skilled therapeutic intervention in order to improve the following deficits and impairments:  Pain, Increased edema, Decreased strength, Decreased range of motion  Visit Diagnosis: Pain in right ankle and joints of right foot  Pain in left ankle and joints of left foot  Localized edema  Muscle weakness (generalized)     Problem List Patient Active Problem List   Diagnosis Date Noted  . Sinusitis 07/31/2016  . Influenza-like illness 07/12/2016  . Peroneal tendinitis of left lower extremity 11/03/2015  . Pes anserine bursitis 11/03/2015  . Asthma with acute exacerbation 06/15/2015  . Acute sinusitis 06/15/2015  . PCP NOTES >>>> 02/23/2015  . GERD (gastroesophageal reflux disease) 01/13/2015  . Myalgia and myositis 07/22/2014  . 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 Paresthesias 06/02/2014  . Acute pharyngitis 05/20/2014  . Strep pharyngitis 05/20/2014  . Degenerative cervical disc 10/28/2013  . Bursitis, shoulder 09/17/2013  . Muscle spasm of back 09/11/2013  . Nonallopathic lesion of thoracic region 09/11/2013  . General medical examination 03/28/2011  . Allergic rhinitis   . TMJ PAIN 12/16/2007  . Headache(784.0) 12/16/2007    Zannie Cove, PT 08/22/2016, 5:14 PM  [facility] [address] [place], [place], [address] Phone: [phone]   Fax:   [phone]  Name: YEKATERINA ESCUTIA MRN: [id number] Date of Birth: 03/27/1971

## 2016-08-23 ENCOUNTER — Ambulatory Visit: Payer: BLUE CROSS/BLUE SHIELD | Admitting: Internal Medicine

## 2016-08-25 DIAGNOSIS — M25572 Pain in left ankle and joints of left foot: Secondary | ICD-10-CM | POA: Diagnosis not present

## 2016-08-25 DIAGNOSIS — M25571 Pain in right ankle and joints of right foot: Secondary | ICD-10-CM | POA: Diagnosis not present

## 2016-08-25 DIAGNOSIS — M25372 Other instability, left ankle: Secondary | ICD-10-CM | POA: Diagnosis not present

## 2016-08-25 DIAGNOSIS — M25371 Other instability, right ankle: Secondary | ICD-10-CM | POA: Diagnosis not present

## 2016-08-28 ENCOUNTER — Other Ambulatory Visit: Payer: Self-pay | Admitting: Family Medicine

## 2016-08-28 MED ORDER — FLUTICASONE PROPIONATE 50 MCG/ACT NA SUSP: 2.0000 | Freq: Every day | NASAL | 0 refills | Status: DC

## 2016-08-29 ENCOUNTER — Other Ambulatory Visit: Payer: Self-pay | Admitting: Family Medicine

## 2016-08-29 ENCOUNTER — Encounter: Payer: Self-pay | Admitting: Internal Medicine

## 2016-08-29 ENCOUNTER — Encounter: Payer: Self-pay | Admitting: Physical Therapy

## 2016-08-29 ENCOUNTER — Encounter: Payer: Self-pay | Admitting: Family Medicine

## 2016-08-29 ENCOUNTER — Ambulatory Visit: Payer: BLUE CROSS/BLUE SHIELD | Admitting: Physical Therapy

## 2016-08-29 DIAGNOSIS — M25572 Pain in left ankle and joints of left foot: Secondary | ICD-10-CM | POA: Diagnosis not present

## 2016-08-29 DIAGNOSIS — M6281 Muscle weakness (generalized): Secondary | ICD-10-CM

## 2016-08-29 DIAGNOSIS — M25571 Pain in right ankle and joints of right foot: Secondary | ICD-10-CM | POA: Diagnosis not present

## 2016-08-29 DIAGNOSIS — R600 Localized edema: Secondary | ICD-10-CM

## 2016-08-29 MED ORDER — TERCONAZOLE 0.4 % VA CREA: 1.0000 | TOPICAL_CREAM | Freq: Every day | VAGINAL | 0 refills | Status: DC

## 2016-08-29 MED ORDER — FLUCONAZOLE 150 MG PO TABS: ORAL_TABLET | ORAL | 0 refills | Status: DC

## 2016-08-29 NOTE — Telephone Encounter (Signed)
terazol 7 1 app pv qhs x 7 days

## 2016-08-29 NOTE — Therapy (Signed)
[facility] [address] [place], [place], [address] Phone: [phone]   Fax:  [phone]  Physical Therapy Treatment  Patient Details  Name: Morgan Long MRN: [id number] Date of Birth: 10-Sep-1970 Referring Provider: Dr. Tamala Julian  Encounter Date: 08/29/2016      PT End of Session - 08/29/16 1617    Visit Number 26   Number of Visits 29   Date for PT Re-Evaluation 10/03/16   Authorization Type 30 visit limit --1 used already   PT Start Time 1619   PT Stop Time 1717   PT Time Calculation (min) 58 min   Activity Tolerance Patient limited by pain   Behavior During Therapy [facility] for tasks assessed/performed      Past Medical History:  Diagnosis Date  . Allergic rhinitis   . Chronic migraine    Dr. Catalina Gravel  . Dry eye syndrome of both lacrimal glands   . Endometriosis   . GERD (gastroesophageal reflux disease)   . Headache(784.0)    occasional, dx w/ Migraines before, Topamax helps  . Nuclear cataract of both eyes    Mild  . Sinusitis   . TMJ pain dysfunction syndrome    occasional    Past Surgical History:  Procedure Laterality Date  . BREAST ENHANCEMENT SURGERY  2006  . endrometroisis    . fallopian tube removed     Left    There were no vitals filed for this visit.      Subjective Assessment - 08/29/16 1721    Subjective Pt states that doctor told her MRI is not totally clear.  States she is going to get orthotics for her shoes.   Pertinent History left bunionectomy   Limitations Walking   Currently in Pain? Yes   Pain Score 5    Pain Location Ankle   Pain Orientation Right;Left   Pain Descriptors / Indicators Aching   Pain Type Chronic pain   Pain Onset More than a month ago   Pain Frequency Intermittent   Aggravating Factors  walking   Pain Relieving Factors elevation   Effect of Pain on Daily Activities can't exercise   Multiple Pain Sites No                          [facility] Adult PT Treatment/Exercise - 08/29/16 0001      Knee/Hip Exercises: Standing   Hip Abduction Stengthening;Both;10 reps;Knee straight  red band   Hip Extension Stengthening;Both;Knee straight;10 reps  red band   Lateral Step Up Both;2 sets;10 reps  onto BOSU   Forward Step Up Right;Left;1 set;20 reps;Hand Hold: 1  onto Bosu     Modalities   Modalities Vasopneumatic     Vasopneumatic   Number Minutes Vasopneumatic  15 minutes   Vasopnuematic Location  Ankle  bilateral   Vasopneumatic Pressure High   Vasopneumatic Temperature  3 snow flakes     Manual Therapy   Manual Therapy Soft tissue mobilization   Soft tissue mobilization peroneal muscles and tendons, plantar surface muscles     Ankle Exercises: Machines for Strengthening   Cybex Leg Press bilateral 100# 3x10; single leg 65# 3x10     Ankle Exercises: Supine   T-Band DF, PF, IV, EV x20 yellow                  PT Short Term Goals - 08/29/16 1712      PT SHORT TERM GOAL #3  Title The patient will have improved bilateral ankle dorsiflexion to 6 degrees needed for ROM to ascend and descend stairs   Time 4   Period Weeks   Status On-going           PT Long Term Goals - 08/29/16 1713      PT LONG TERM GOAL #1   Title The patient will be independent in safe self progression of HEP for further improvements in ROM and strength  07/28/16   Time 10   Period Weeks   Status On-going     PT LONG TERM GOAL #3   Title The patient will have improved ankle plantarflexion to 50 degrees, dorsiflexion to 8 degrees, eversion to 14 degrees needed for ambulation on uneven surfaces   Time 10   Period Weeks   Status On-going     PT LONG TERM GOAL #5   Title Ankle strength grossly 4/5 needed for standing/walking longer periods of time   Time 10   Period Weeks   Status On-going               Plan - 08/29/16 1619    Clinical Impression Statement Patient continues to have popping in left ankle more  than right.  She was able to tolerate standing exercises better than last session.  Manual therapy performed to peroneals and plantar muscles on left leg and foot.  Pt had tenderness all along peroneals muscles.  She demonstrates more weakness on left LE with standing exercises.  Pt coninues to need skilled PT for improved function in standing activities.   Rehab Potential Good   PT Treatment/Interventions ADLs/Self Care Home Management;Cryotherapy;Electrical Stimulation;Iontophoresis 4mg /ml Dexamethasone;Ultrasound;Therapeutic exercise;Patient/family education;Manual techniques;Dry needling;Taping;Vasopneumatic Device   PT Next Visit Plan ankle strengthening progressing to more single leg standing as tolerated   PT Home Exercise Plan progress as needed   Consulted and Agree with Plan of Care Patient      Patient will benefit from skilled therapeutic intervention in order to improve the following deficits and impairments:  Pain, Increased edema, Decreased strength, Decreased range of motion  Visit Diagnosis: Pain in right ankle and joints of right foot  Pain in left ankle and joints of left foot  Localized edema  Muscle weakness (generalized)     Problem List Patient Active Problem List   Diagnosis Date Noted  . Sinusitis 07/31/2016  . Influenza-like illness 07/12/2016  . Peroneal tendinitis of left lower extremity 11/03/2015  . Pes anserine bursitis 11/03/2015  . Asthma with acute exacerbation 06/15/2015  . Acute sinusitis 06/15/2015  . PCP NOTES >>>> 02/23/2015  . GERD (gastroesophageal reflux disease) 01/13/2015  . Myalgia and myositis 07/22/2014  . 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 Paresthesias 06/02/2014  . Acute pharyngitis 05/20/2014  . Strep pharyngitis 05/20/2014  . Degenerative cervical disc 10/28/2013  . Bursitis, shoulder 09/17/2013  . Muscle spasm of back 09/11/2013  . Nonallopathic lesion of thoracic region 09/11/2013  . General medical examination  03/28/2011  . Allergic rhinitis   . TMJ PAIN 12/16/2007  . Headache(784.0) 12/16/2007    Zannie Cove, PT 08/29/2016, 5:23 PM  [facility] [address] [place], [place], [address] Phone: [phone]   Fax:  [phone]  Name: Morgan Long MRN: [id number] Date of Birth: 02/09/1971

## 2016-08-29 NOTE — Telephone Encounter (Signed)
Diflucan 150 mg #2  1 po qd x 1, may repeat in 3 days prn

## 2016-09-05 ENCOUNTER — Other Ambulatory Visit: Payer: Self-pay | Admitting: Family Medicine

## 2016-09-05 ENCOUNTER — Ambulatory Visit: Payer: BLUE CROSS/BLUE SHIELD | Attending: Family Medicine | Admitting: Physical Therapy

## 2016-09-05 DIAGNOSIS — M25571 Pain in right ankle and joints of right foot: Secondary | ICD-10-CM | POA: Insufficient documentation

## 2016-09-05 DIAGNOSIS — M25572 Pain in left ankle and joints of left foot: Secondary | ICD-10-CM | POA: Diagnosis not present

## 2016-09-05 DIAGNOSIS — R059 Cough, unspecified: Secondary | ICD-10-CM

## 2016-09-05 DIAGNOSIS — M6281 Muscle weakness (generalized): Secondary | ICD-10-CM | POA: Diagnosis not present

## 2016-09-05 DIAGNOSIS — R600 Localized edema: Secondary | ICD-10-CM | POA: Insufficient documentation

## 2016-09-05 DIAGNOSIS — R05 Cough: Secondary | ICD-10-CM

## 2016-09-05 NOTE — Therapy (Signed)
[facility] [address] [place], [place], [address] Phone: [phone]   Fax:  [phone]  Physical Therapy Treatment  Patient Details  Name: Morgan Long MRN: [id number] Date of Birth: Sep 11, 1970 Referring Provider: Dr. Tamala Julian  Encounter Date: 09/05/2016      PT End of Session - 09/05/16 1640    Visit Number 27   Number of Visits 29   Date for PT Re-Evaluation 10/03/16   PT Start Time 1619   PT Stop Time 1712   PT Time Calculation (min) 53 min   Activity Tolerance Patient limited by pain   Behavior During Therapy [facility] for tasks assessed/performed      Past Medical History:  Diagnosis Date  . Allergic rhinitis   . Chronic migraine    Dr. Catalina Gravel  . Dry eye syndrome of both lacrimal glands   . Endometriosis   . GERD (gastroesophageal reflux disease)   . Headache(784.0)    occasional, dx w/ Migraines before, Topamax helps  . Nuclear cataract of both eyes    Mild  . Sinusitis   . TMJ pain dysfunction syndrome    occasional    Past Surgical History:  Procedure Laterality Date  . BREAST ENHANCEMENT SURGERY  2006  . endrometroisis    . fallopian tube removed     Left    There were no vitals filed for this visit.      Subjective Assessment - 09/05/16 1624    Subjective States her ankles have been more sore and swollen.    Pertinent History left bunionectomy   Limitations Walking   How long can you walk comfortably? 20-30 minutes   Diagnostic tests x-ray after fall, MRI   Patient Stated Goals get back to regular ex regimen, stop swelling;    Currently in Pain? Yes   Pain Score 7    Pain Location Ankle   Pain Orientation Right;Left   Pain Descriptors / Indicators Aching   Pain Type Chronic pain   Pain Onset More than a month ago   Pain Frequency Intermittent   Aggravating Factors  walking   Pain Relieving Factors rest   Effect of Pain on Daily Activities can't exercise                          [facility] Adult PT Treatment/Exercise - 09/05/16 0001      Knee/Hip Exercises: Stretches   Gastroc Stretch Both;3 reps;20 seconds     Knee/Hip Exercises: Aerobic   Nustep L3 x 10 min     Knee/Hip Exercises: Standing   Hip ADduction Strengthening;Both;15 reps  red band   Hip Abduction Stengthening;Both;Knee straight;15 reps  red band   Hip Extension Stengthening;Both;Knee straight;15 reps  red band   Lateral Step Up --   Walking with Sports Cord 5x #20 back, forward, side to side     Vasopneumatic   Number Minutes Vasopneumatic  15 minutes   Vasopnuematic Location  Ankle  bilateral   Vasopneumatic Pressure High   Vasopneumatic Temperature  3 snow flakes     Ankle Exercises: Machines for Strengthening   Cybex Leg Press bilateral 100# 3x10; single leg 65# 3x10     Ankle Exercises: Supine   T-Band --                  PT Short Term Goals - 08/29/16 1712      PT SHORT TERM GOAL #3   Title The  patient will have improved bilateral ankle dorsiflexion to 6 degrees needed for ROM to ascend and descend stairs   Time 4   Period Weeks   Status On-going           PT Long Term Goals - 09/05/16 1700      PT LONG TERM GOAL #1   Title The patient will be independent in safe self progression of HEP for further improvements in ROM and strength  07/28/16   Time 10   Period Weeks   Status On-going     PT LONG TERM GOAL #2   Title The patient will report a 60% improvement in pain and swelling with home and work ADLS   Baseline 28% improvement in swelling; 25-30% improved pain   Time 10   Period Weeks   Status On-going     PT LONG TERM GOAL #4   Title The patient will be able to walk > 1/2 mile for community mobility    Baseline does well in the water, but not far in community   Time 10   Period Weeks   Status On-going               Plan - 09/05/16 1702    Clinical Impression Statement Pt continues to have popping but  was not as much today.  Pt was given red and green bands in order to progress standing exercises as part of HEP. Pt continues to have weakness and instability from injury.  Pt making slow progress.   Rehab Potential Good   PT Treatment/Interventions ADLs/Self Care Home Management;Cryotherapy;Electrical Stimulation;Iontophoresis 4mg /ml Dexamethasone;Ultrasound;Therapeutic exercise;Patient/family education;Manual techniques;Dry needling;Taping;Vasopneumatic Device   PT Next Visit Plan work on transitioning to HEP and progress HEP as needed, ankle and LE strength and stability progression, vasopneumatic for swelling   PT Home Exercise Plan progress as needed   Consulted and Agree with Plan of Care Patient      Patient will benefit from skilled therapeutic intervention in order to improve the following deficits and impairments:  Pain, Increased edema, Decreased strength, Decreased range of motion  Visit Diagnosis: Pain in right ankle and joints of right foot  Pain in left ankle and joints of left foot  Localized edema  Muscle weakness (generalized)     Problem List Patient Active Problem List   Diagnosis Date Noted  . Sinusitis 07/31/2016  . Influenza-like illness 07/12/2016  . Peroneal tendinitis of left lower extremity 11/03/2015  . Pes anserine bursitis 11/03/2015  . Asthma with acute exacerbation 06/15/2015  . Acute sinusitis 06/15/2015  . PCP NOTES >>>> 02/23/2015  . GERD (gastroesophageal reflux disease) 01/13/2015  . Myalgia and myositis 07/22/2014  . 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 Paresthesias 06/02/2014  . Acute pharyngitis 05/20/2014  . Strep pharyngitis 05/20/2014  . Degenerative cervical disc 10/28/2013  . Bursitis, shoulder 09/17/2013  . Muscle spasm of back 09/11/2013  . Nonallopathic lesion of thoracic region 09/11/2013  . General medical examination 03/28/2011  . Allergic rhinitis   . TMJ PAIN 12/16/2007  . Headache(784.0) 12/16/2007    Zannie Cove, PT 09/05/2016, 5:10 PM  [facility] [address] [place], [place], [address] Phone: [phone]   Fax:  [phone]  Name: Morgan Long MRN: [id number] Date of Birth: February 05, 1971

## 2016-09-08 ENCOUNTER — Other Ambulatory Visit: Payer: Self-pay | Admitting: Family Medicine

## 2016-09-08 DIAGNOSIS — H16223 Keratoconjunctivitis sicca, not specified as Sjogren's, bilateral: Secondary | ICD-10-CM | POA: Diagnosis not present

## 2016-09-08 DIAGNOSIS — H04123 Dry eye syndrome of bilateral lacrimal glands: Secondary | ICD-10-CM | POA: Diagnosis not present

## 2016-09-08 NOTE — Telephone Encounter (Signed)
Do not see on current list

## 2016-09-12 ENCOUNTER — Encounter: Payer: Self-pay | Admitting: Physical Therapy

## 2016-09-12 ENCOUNTER — Ambulatory Visit: Payer: BLUE CROSS/BLUE SHIELD | Admitting: Physical Therapy

## 2016-09-12 DIAGNOSIS — M6281 Muscle weakness (generalized): Secondary | ICD-10-CM

## 2016-09-12 DIAGNOSIS — M25571 Pain in right ankle and joints of right foot: Secondary | ICD-10-CM

## 2016-09-12 DIAGNOSIS — R600 Localized edema: Secondary | ICD-10-CM

## 2016-09-12 DIAGNOSIS — M25572 Pain in left ankle and joints of left foot: Secondary | ICD-10-CM

## 2016-09-12 NOTE — Therapy (Signed)
[facility] [address] [place], [place], [address] Phone: [phone]   Fax:  [phone]  Physical Therapy Treatment  Patient Details  Name: Morgan Long MRN: [id number] Date of Birth: Dec 27, 1970 Referring Provider: Dr. Tamala Julian  Encounter Date: 09/12/2016      PT End of Session - 09/12/16 1627    Visit Number 28   Date for PT Re-Evaluation 10/03/16   PT Start Time 7017   PT Stop Time 1706   PT Time Calculation (min) 52 min   Activity Tolerance Patient limited by pain   Behavior During Therapy [facility] for tasks assessed/performed      Past Medical History:  Diagnosis Date  . Allergic rhinitis   . Chronic migraine    Dr. Catalina Gravel  . Dry eye syndrome of both lacrimal glands   . Endometriosis   . GERD (gastroesophageal reflux disease)   . Headache(784.0)    occasional, dx w/ Migraines before, Topamax helps  . Nuclear cataract of both eyes    Mild  . Sinusitis   . TMJ pain dysfunction syndrome    occasional    Past Surgical History:  Procedure Laterality Date  . BREAST ENHANCEMENT SURGERY  2006  . endrometroisis    . fallopian tube removed     Left    There were no vitals filed for this visit.      Subjective Assessment - 09/12/16 1617    Subjective States ankles feel about the same. Swelling has gotten better. Pain 6/10 on Lt 4/10 on Rt   Pertinent History left bunionectomy   Limitations Walking   How long can you walk comfortably? 20-30 minutes   Diagnostic tests x-ray after fall, MRI   Patient Stated Goals get back to regular ex regimen, stop swelling;    Currently in Pain? Yes   Pain Score 6   Worse on Left   Pain Location Ankle   Pain Orientation Right;Left   Pain Descriptors / Indicators Aching   Pain Type Chronic pain   Pain Onset More than a month ago   Pain Frequency Intermittent   Aggravating Factors  walking   Pain Relieving Factors rest   Effect of Pain on Daily Activities can;t  exercise   Multiple Pain Sites No                         [facility] Adult PT Treatment/Exercise - 09/12/16 0001      Knee/Hip Exercises: Stretches   Active Hamstring Stretch Both;2 reps;10 seconds   Gastroc Stretch Both;3 reps;20 seconds     Knee/Hip Exercises: Aerobic   Nustep L3 x 10 min     Knee/Hip Exercises: Machines for Strengthening   Cybex Knee Extension #15 2x10   Cybex Knee Flexion #25 2x10     Knee/Hip Exercises: Standing   Hip ADduction Strengthening;Both;15 reps  red band   Hip Abduction Stengthening;Both;Knee straight;15 reps  red band   Hip Extension Stengthening;Both;Knee straight;15 reps  red band   Walking with Sports Cord 5x #25 back, forward, side to side     Modalities   Modalities Vasopneumatic     Vasopneumatic   Number Minutes Vasopneumatic  15 minutes   Vasopnuematic Location  Ankle  bilateral   Vasopneumatic Pressure High   Vasopneumatic Temperature  3 snow flakes     Ankle Exercises: Machines for Strengthening   Cybex Leg Press bilateral 100# 3x10; single leg 65# 3x10  PT Short Term Goals - 09/12/16 1628      PT SHORT TERM GOAL #3   Title The patient will have improved bilateral ankle dorsiflexion to 6 degrees needed for ROM to ascend and descend stairs   Time 4   Period Weeks   Status On-going           PT Long Term Goals - 09/12/16 1642      PT LONG TERM GOAL #1   Title The patient will be independent in safe self progression of HEP for further improvements in ROM and strength  07/28/16   Time 10   Period Weeks   Status On-going     PT LONG TERM GOAL #2   Title The patient will report a 60% improvement in pain and swelling with home and work ADLS   Baseline 24% improvement in swelling; 25-30% improved pain   Time 10   Period Weeks   Status On-going               Plan - 09/12/16 1644    Clinical Impression Statement Pt presents with continued ankle pain and popping with  instability and decreased toelrance for activity. Pt reports she was able to do 15 minutes on Bike at gym and has done well with pool exercises. Pt is waiting to hear back from ankle speciallist regarding MRI. Pt continues to reports that vasoneumatic helps the most with pain and swelling. Pt will continue to benefit from skilled therapy for ankle and LE strength and stability to return to ADLs with no pain.    Rehab Potential Good   PT Frequency 1x / week   PT Duration 12 weeks   PT Treatment/Interventions ADLs/Self Care Home Management;Cryotherapy;Electrical Stimulation;Iontophoresis 4mg /ml Dexamethasone;Ultrasound;Therapeutic exercise;Patient/family education;Manual techniques;Dry needling;Taping;Vasopneumatic Device   PT Next Visit Plan work on transitioning to HEP and progress HEP as needed, ankle and LE strength and stability progression, vasopneumatic for swelling   Consulted and Agree with Plan of Care Patient      Patient will benefit from skilled therapeutic intervention in order to improve the following deficits and impairments:  Pain, Increased edema, Decreased strength, Decreased range of motion  Visit Diagnosis: Pain in right ankle and joints of right foot  Pain in left ankle and joints of left foot  Localized edema  Muscle weakness (generalized)     Problem List Patient Active Problem List   Diagnosis Date Noted  . Sinusitis 07/31/2016  . Influenza-like illness 07/12/2016  . Peroneal tendinitis of left lower extremity 11/03/2015  . Pes anserine bursitis 11/03/2015  . Asthma with acute exacerbation 06/15/2015  . Acute sinusitis 06/15/2015  . PCP NOTES >>>> 02/23/2015  . GERD (gastroesophageal reflux disease) 01/13/2015  . Myalgia and myositis 07/22/2014  . 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 Paresthesias 06/02/2014  . Acute pharyngitis 05/20/2014  . Strep pharyngitis 05/20/2014  . Degenerative cervical disc 10/28/2013  . Bursitis, shoulder 09/17/2013  .  Muscle spasm of back 09/11/2013  . Nonallopathic lesion of thoracic region 09/11/2013  . General medical examination 03/28/2011  . Allergic rhinitis   . TMJ PAIN 12/16/2007  . Headache(784.0) 12/16/2007    Mikle Bosworth PTA 09/12/2016, 4:57 PM  [facility] [address] [place], [place], [address] Phone: [phone]   Fax:  [phone]  Name: Morgan Long MRN: [id number] Date of Birth: [birth year]

## 2016-09-19 ENCOUNTER — Encounter: Payer: Self-pay | Admitting: Physical Therapy

## 2016-09-19 ENCOUNTER — Ambulatory Visit: Payer: BLUE CROSS/BLUE SHIELD | Admitting: Physical Therapy

## 2016-09-19 DIAGNOSIS — M25572 Pain in left ankle and joints of left foot: Secondary | ICD-10-CM | POA: Diagnosis not present

## 2016-09-19 DIAGNOSIS — M25571 Pain in right ankle and joints of right foot: Secondary | ICD-10-CM | POA: Diagnosis not present

## 2016-09-19 DIAGNOSIS — M6281 Muscle weakness (generalized): Secondary | ICD-10-CM

## 2016-09-19 DIAGNOSIS — R600 Localized edema: Secondary | ICD-10-CM

## 2016-09-19 NOTE — Therapy (Signed)
[facility] [address] [place], [place], [address] Phone: [phone]   Fax:  [phone]  Physical Therapy Treatment  Patient Details  Name: Morgan Long MRN: [id number] Date of Birth: December 15, 1970 Referring Provider: Dr. Tamala Julian  Encounter Date: 09/19/2016      PT End of Session - 09/19/16 1619    Visit Number 29   Date for PT Re-Evaluation 10/03/16   PT Start Time 2505   PT Stop Time 1709   PT Time Calculation (min) 52 min   Activity Tolerance Patient limited by pain   Behavior During Therapy [facility] for tasks assessed/performed      Past Medical History:  Diagnosis Date  . Allergic rhinitis   . Chronic migraine    Dr. Catalina Gravel  . Dry eye syndrome of both lacrimal glands   . Endometriosis   . GERD (gastroesophageal reflux disease)   . Headache(784.0)    occasional, dx w/ Migraines before, Topamax helps  . Nuclear cataract of both eyes    Mild  . Sinusitis   . TMJ pain dysfunction syndrome    occasional    Past Surgical History:  Procedure Laterality Date  . BREAST ENHANCEMENT SURGERY  2006  . endrometroisis    . fallopian tube removed     Left    There were no vitals filed for this visit.      Subjective Assessment - 09/19/16 1618    Subjective Ankles feeling a little better than last visit. Rt knee is bothering me today, not sure if it's a charlie horse or something like that.    Pertinent History left bunionectomy   Limitations Walking   How long can you walk comfortably? 20-30 minutes   Diagnostic tests x-ray after fall, MRI   Patient Stated Goals get back to regular ex regimen, stop swelling;    Currently in Pain? Yes   Pain Score 3    Pain Location Ankle   Pain Orientation Right;Left   Pain Descriptors / Indicators Aching   Pain Type Chronic pain   Pain Onset More than a month ago   Pain Frequency Intermittent   Aggravating Factors  walking   Pain Relieving Factors rest   Effect of  Pain on Daily Activities can't exercise   Multiple Pain Sites No                         [facility] Adult PT Treatment/Exercise - 09/19/16 0001      Knee/Hip Exercises: Stretches   Active Hamstring Stretch Both;2 reps;10 seconds   Gastroc Stretch Both;3 reps;20 seconds     Knee/Hip Exercises: Machines for Strengthening   Cybex Knee Extension #15 2x10   Cybex Knee Flexion #25 2x10     Knee/Hip Exercises: Standing   SLS Toe touch weight bearing on 2" box with plyo toss  2x20 each on blue pod TTWB on floor     Modalities   Modalities Vasopneumatic     Vasopneumatic   Number Minutes Vasopneumatic  15 minutes   Vasopnuematic Location  Ankle  bilateral   Vasopneumatic Pressure High   Vasopneumatic Temperature  3 snow flakes     Ankle Exercises: Machines for Strengthening   Cybex Leg Press bilateral 100# 3x10; single leg 65# 3x10                  PT Short Term Goals - 09/12/16 1628      PT SHORT TERM GOAL #  3   Title The patient will have improved bilateral ankle dorsiflexion to 6 degrees needed for ROM to ascend and descend stairs   Time 4   Period Weeks   Status On-going           PT Long Term Goals - 09/19/16 1621      PT LONG TERM GOAL #1   Title The patient will be independent in safe self progression of HEP for further improvements in ROM and strength  07/28/16   Time 10   Period Weeks   Status On-going     PT LONG TERM GOAL #2   Title The patient will report a 60% improvement in pain and swelling with home and work ADLS   Baseline 51%   Time 10   Period Weeks   Status Achieved     PT LONG TERM GOAL #4   Title The patient will be able to walk > 1/2 mile for community mobility    Baseline does well in the water, but not far in community   Time 10   Period Weeks   Status On-going     PT LONG TERM GOAL #6   Title FOTO functional outcome score improved from 63% to 42% indicating improved function with less pain   Baseline 59%    Time 10   Period Weeks   Status On-going               Plan - 09/19/16 1634    Clinical Impression Statement Pt continues to have weakness and pain in Bil ankles Lt>Rt. Pt has been wearing ankle braces given by ankle speciallist. Pt able to tolerate all exercises well today but started having popping in Lt ankle towards end of exercises. Pt will cotninue to benefit from skilled therapy for LE strength and stability.    Rehab Potential Good   PT Frequency 1x / week   PT Duration 12 weeks   PT Treatment/Interventions ADLs/Self Care Home Management;Cryotherapy;Electrical Stimulation;Iontophoresis 4mg /ml Dexamethasone;Ultrasound;Therapeutic exercise;Patient/family education;Manual techniques;Dry needling;Taping;Vasopneumatic Device   PT Next Visit Plan work on transitioning to HEP and progress HEP as needed, ankle and LE strength and stability progression, vasopneumatic for swelling   Consulted and Agree with Plan of Care Patient      Patient will benefit from skilled therapeutic intervention in order to improve the following deficits and impairments:  Pain, Increased edema, Decreased strength, Decreased range of motion  Visit Diagnosis: Pain in right ankle and joints of right foot  Pain in left ankle and joints of left foot  Localized edema  Muscle weakness (generalized)     Problem List Patient Active Problem List   Diagnosis Date Noted  . Sinusitis 07/31/2016  . Influenza-like illness 07/12/2016  . Peroneal tendinitis of left lower extremity 11/03/2015  . Pes anserine bursitis 11/03/2015  . Asthma with acute exacerbation 06/15/2015  . Acute sinusitis 06/15/2015  . PCP NOTES >>>> 02/23/2015  . GERD (gastroesophageal reflux disease) 01/13/2015  . Myalgia and myositis 07/22/2014  . 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 Paresthesias 06/02/2014  . Acute pharyngitis 05/20/2014  . Strep pharyngitis 05/20/2014  . Degenerative cervical disc 10/28/2013  . Bursitis,  shoulder 09/17/2013  . Muscle spasm of back 09/11/2013  . Nonallopathic lesion of thoracic region 09/11/2013  . General medical examination 03/28/2011  . Allergic rhinitis   . TMJ PAIN 12/16/2007  . Headache(784.0) 12/16/2007    Mikle Bosworth PTA 09/19/2016, 5:18 PM  [facility] [address]  [place], [place], [place], [address] Phone: [phone]   Fax:  [phone]  Name: TANAY MASSIAH MRN: [id number] Date of Birth: 05/09/1971

## 2016-09-20 ENCOUNTER — Encounter (HOSPITAL_BASED_OUTPATIENT_CLINIC_OR_DEPARTMENT_OTHER): Payer: Self-pay | Admitting: Emergency Medicine

## 2016-09-20 ENCOUNTER — Emergency Department (HOSPITAL_BASED_OUTPATIENT_CLINIC_OR_DEPARTMENT_OTHER)
Admission: EM | Admit: 2016-09-20 | Discharge: 2016-09-20 | Disposition: A | Discharge status: Home / Self Care | Payer: BLUE CROSS/BLUE SHIELD | Attending: Emergency Medicine | Admitting: Emergency Medicine

## 2016-09-20 DIAGNOSIS — Z79899 Other long term (current) drug therapy: Secondary | ICD-10-CM | POA: Insufficient documentation

## 2016-09-20 DIAGNOSIS — W57XXXA Bitten or stung by nonvenomous insect and other nonvenomous arthropods, initial encounter: Secondary | ICD-10-CM | POA: Insufficient documentation

## 2016-09-20 DIAGNOSIS — Y929 Unspecified place or not applicable: Secondary | ICD-10-CM | POA: Diagnosis not present

## 2016-09-20 DIAGNOSIS — Y939 Activity, unspecified: Secondary | ICD-10-CM | POA: Insufficient documentation

## 2016-09-20 DIAGNOSIS — S80861A Insect bite (nonvenomous), right lower leg, initial encounter: Secondary | ICD-10-CM | POA: Insufficient documentation

## 2016-09-20 DIAGNOSIS — Y999 Unspecified external cause status: Secondary | ICD-10-CM | POA: Insufficient documentation

## 2016-09-20 DIAGNOSIS — J45909 Unspecified asthma, uncomplicated: Secondary | ICD-10-CM | POA: Diagnosis not present

## 2016-09-20 HISTORY — DX: Gastroparesis: K31.84

## 2016-09-20 HISTORY — DX: Muscle weakness (generalized): M62.81

## 2016-09-20 HISTORY — DX: Localized edema: R60.0

## 2016-09-20 HISTORY — DX: Pain in right ankle and joints of right foot: M25.571

## 2016-09-20 LAB — PREGNANCY, URINE: Preg Test, Ur: NEGATIVE

## 2016-09-20 MED ORDER — DOXYCYCLINE HYCLATE 100 MG PO CAPS: 100.0000 mg | ORAL_CAPSULE | Freq: Two times a day (BID) | ORAL | 0 refills | Status: DC

## 2016-09-20 MED ORDER — DIPHENHYDRAMINE HCL 25 MG PO TABS: 25.0000 mg | ORAL_TABLET | Freq: Four times a day (QID) | ORAL | 0 refills | Status: DC | PRN

## 2016-09-20 MED ORDER — DIPHENHYDRAMINE HCL 25 MG PO CAPS
25.0000 mg | ORAL_CAPSULE | Freq: Once | ORAL | Status: AC
  Administered 2016-09-20: 25 mg via ORAL
  Filled 2016-09-20: qty 1

## 2016-09-20 MED ORDER — DOXYCYCLINE HYCLATE 100 MG PO TABS
100.0000 mg | ORAL_TABLET | Freq: Once | ORAL | Status: AC
  Administered 2016-09-20: 100 mg via ORAL
  Filled 2016-09-20: qty 1

## 2016-09-20 NOTE — ED Notes (Signed)
Gave pt another warm blanket.
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
## 2016-09-20 NOTE — ED Notes (Signed)
ED Provider at bedside. 

## 2016-09-20 NOTE — ED Provider Notes (Signed)
[facility] Provider Note   CSN: [id number] Arrival date & time: 09/20/16  1849  By signing my name below, I, Hansel Feinstein, attest that this documentation has been prepared under the direction and in the presence of Waynetta Pean, PA-C. Electronically Signed: Hansel Feinstein, ED Scribe. 09/20/16. 9:26 PM.    History   Chief Complaint Chief Complaint  Patient presents with  . Cellulitis    HPI Morgan Long is a 46 y.o. female who presents to the Emergency Department complaining of an area of moderate, gradually worsening pain, pruritus, swelling and redness to the right shin that occurred 2 days ago. Pt reports associated tingling to hands and feet. Pt states she felt a bite on her right leg and the area was initially smaller. She states her pain is worsened with touch. Pt denies taking OTC medications at home to improve symptoms. Pt also reports some anxiety as a family member has previously been treated for cellulitis and she is concerned she has cellulitis. She also denies fever, numbness, weakness, additional complaints, tongue swelling, lip swelling.    The history is provided by the patient. No language interpreter was used.    Past Medical History:  Diagnosis Date  . Allergic rhinitis   . Chronic migraine    Dr. Catalina Gravel  . Dry eye syndrome of both lacrimal glands   . Endometriosis   . Gastroparesis   . GERD (gastroesophageal reflux disease)   . Headache(784.0)    occasional, dx w/ Migraines before, Topamax helps  . Localized edema   . Muscle weakness (generalized)   . Nuclear cataract of both eyes    Mild  . Pain in right ankle and joints of right foot   . Sinusitis   . TMJ pain dysfunction syndrome    occasional    Patient Active Problem List   Diagnosis Date Noted  . Sinusitis 07/31/2016  . Influenza-like illness 07/12/2016  . Peroneal tendinitis of left lower extremity 11/03/2015  . Pes anserine bursitis 11/03/2015  . Asthma with acute exacerbation  06/15/2015  . Acute sinusitis 06/15/2015  . PCP NOTES >>>> 02/23/2015  . GERD (gastroesophageal reflux disease) 01/13/2015  . Myalgia and myositis 07/22/2014  . 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 Paresthesias 06/02/2014  . Acute pharyngitis 05/20/2014  . Strep pharyngitis 05/20/2014  . Degenerative cervical disc 10/28/2013  . Bursitis, shoulder 09/17/2013  . Muscle spasm of back 09/11/2013  . Nonallopathic lesion of thoracic region 09/11/2013  . General medical examination 03/28/2011  . Allergic rhinitis   . TMJ PAIN 12/16/2007  . Headache(784.0) 12/16/2007    Past Surgical History:  Procedure Laterality Date  . BREAST ENHANCEMENT SURGERY  2006  . endrometroisis    . fallopian tube removed     Left    OB History    No data available       Home Medications    Prior to Admission medications   Medication Sig Start Date End Date Taking? Authorizing Provider  albuterol (PROAIR HFA) 108 (90 Base) MCG/ACT inhaler INHALE TWO PUFFS EVERY 4-6 HOURS IF NEEDED FOR COUGH WHEEZE. 06/15/15   Adelina Mings, MD  AMBULATORY NON FORMULARY MEDICATION Medication Name: Domperidone 10 mg tablets Take one po before meals and at bedtime. 12/31/15   Jerene Bears, MD  amoxicillin-clavulanate (AUGMENTIN) 875-125 MG tablet Take 1 tablet by mouth 2 (two) times daily. Take for 10 days. 07/31/16   Rosalita Chessman Chase, DO  azelastine (ASTELIN) 0.1 % nasal spray Place  2 sprays into both nostrils daily as needed for rhinitis. Reported on 06/03/2015    Historical Provider, MD  beclomethasone (QVAR) 80 MCG/ACT inhaler INHALE TWO PUFFS TWICE DAILY TO PREVENT COUGH OR WHEEZE. RINSE MOUTH AFTER USE. USE WITH SPACER. 06/15/15   Adelina Mings, MD  benzonatate (TESSALON) 100 MG capsule Take 1-2 capsules (100-200 mg total) by mouth 3 (three) times daily as needed for cough. 08/05/16   Elby Beck, FNP  clindamycin (CLEOCIN T) 1 % external solution  02/26/14   Historical Provider, MD    cyclobenzaprine (FLEXERIL) 10 MG tablet TAKE 1 TABLET (10 MG TOTAL) BY MOUTH AT BEDTIME AS NEEDED (HEADACHE AND TMJ). 09/08/16   Rosalita Chessman Chase, DO  diphenhydrAMINE (BENADRYL) 25 MG tablet Take 1 tablet (25 mg total) by mouth every 6 (six) hours as needed for itching (Rash). 09/20/16   Waynetta Pean, PA-C  doxycycline (VIBRAMYCIN) 100 MG capsule Take 1 capsule (100 mg total) by mouth 2 (two) times daily. 09/20/16   Waynetta Pean, PA-C  DRYSOL 20 % external solution Reported on 06/03/2015 02/25/14   Historical Provider, MD  ESTROGEL 0.75 MG/1.25 GM (0.06%) topical gel Apply 1 application topically See admin instructions. 05/18/15   Historical Provider, MD  etonogestrel-ethinyl estradiol (NUVARING) 0.12-0.015 MG/24HR vaginal ring Place 1 each vaginally every 28 (twenty-eight) days. Insert vaginally and leave in place for 3 consecutive weeks, then remove for 1 week.     Historical Provider, MD  fluconazole (DIFLUCAN) 150 MG tablet 1 po qd x 1, may repeat in 3 days prn 08/29/16   Rosalita Chessman Chase, DO  FLUOCINOLONE ACETONIDE SCALP 0.01 % OIL Reported on 10/26/2015 02/11/14   Historical Provider, MD  fluocinonide ointment (LIDEX) 2.72 % Apply 1 application topically as needed. 04/12/15   Historical Provider, MD  fluticasone (FLONASE) 50 MCG/ACT nasal spray Place 2 sprays into both nostrils daily. 08/28/16   Yvonne R Lowne Chase, DO  LINZESS 72 MCG capsule TAKE 1 CAPSULE (72 MCG TOTAL) BY MOUTH DAILY BEFORE BREAKFAST. 06/30/16   Jerene Bears, MD  Multiple Vitamin (MULTIVITAMIN) LIQD Take 5 mLs by mouth daily.    Historical Provider, MD  ondansetron (ZOFRAN) 4 MG tablet Take 1 every 6 hours as needed for nausea. 07/06/16   Amy S Esterwood, PA-C  promethazine-dextromethorphan (PROMETHAZINE-DM) 6.25-15 MG/5ML syrup Take 5 mLs by mouth 4 (four) times daily as needed for cough. 07/31/16   Rosalita Chessman Chase, DO  ranitidine (ZANTAC) 150 MG tablet Take 1 tablet (150 mg total) by mouth 2 (two) times daily. 07/06/16    Amy S Esterwood, PA-C  spironolactone (ALDACTONE) 50 MG tablet Take 1 tablet by mouth as needed. 05/08/15   Historical Provider, MD  terconazole (TERAZOL 7) 0.4 % vaginal cream Place 1 applicator vaginally at bedtime. 7 days 08/29/16   [name], DO    Family History Family History  Problem Relation Age of Onset  . Throat cancer Mother   . Heart disease Father   . Breast cancer Maternal Aunt     breast  . Breast cancer Maternal Aunt     breast  . Pancreatic cancer Maternal Aunt   . Diabetes      grandmother  . Hypertension Maternal Aunt     several family members  . Throat cancer Maternal Uncle   . Asthma      cousin, maternal  . Breast cancer Maternal Aunt     total of 5 aunts  . Breast cancer  Maternal Aunt   . Colon cancer Neg Hx   . Heart attack Neg Hx     Social History Social History  Substance Use Topics  . Smoking status: Never Smoker  . Smokeless tobacco: Never Used  . Alcohol use 0.0 oz/week     Comment: socially      Allergies   Acetaminophen; Hydrocodone; Oxycodone; Phosphate; and Codeine   Review of Systems Review of Systems  Constitutional: Positive for chills. Negative for fever.  HENT: Negative for congestion.   Respiratory: Negative for shortness of breath.   Cardiovascular: Negative for leg swelling.  Gastrointestinal: Negative for abdominal pain.  Genitourinary: Negative for dysuria.  Musculoskeletal: Positive for myalgias.  Skin: Positive for color change.  Allergic/Immunologic: Negative for immunocompromised state.  Neurological: Negative for weakness and numbness.  Psychiatric/Behavioral: The patient is nervous/anxious.      Physical Exam Updated Vital Signs BP 115/85 (BP Location: Right Arm)   Pulse 86   Temp 98.9 F (37.2 C) (Oral)   Resp 18   Ht 5\' 3"  (1.6 m)   Wt 86.2 kg   LMP 08/16/2016   SpO2 99%   BMI 33.66 kg/m   Physical Exam  Constitutional: She is oriented to person, place, and time. She appears  well-developed and well-nourished. No distress.  Non-toxic appearing.   HENT:  Head: Normocephalic and atraumatic.  Mouth/Throat: Oropharynx is clear and moist.  Eyes: Right eye exhibits no discharge. Left eye exhibits no discharge.  Neck: Neck supple.  Cardiovascular: Normal rate, regular rhythm, normal heart sounds and intact distal pulses.   DP and PT pulses intact bilaterally. Radial pulses 2+ bilaterally.   Pulmonary/Chest: Effort normal and breath sounds normal. No respiratory distress.  Abdominal: Soft. There is no tenderness.  Musculoskeletal: Normal range of motion. She exhibits tenderness. She exhibits no edema or deformity.  3 cm area of erythema to her right lower shin. No surrounding erythema or edema. Good strength with plantar and dorsiflexion. Good sensation. Capillary refill intact to distal toes. Strength and sensation intact to bilateral hands. Capillary refill intact to distal fingertips.   Neurological: She is alert and oriented to person, place, and time. No sensory deficit. She exhibits normal muscle tone. Coordination normal.  Sensation is intact in her bilateral upper and lower extremities. Good strength to her bilateral upper and lower extremity.  Skin: Skin is warm and dry. Capillary refill takes less than 2 seconds. No rash noted. She is not diaphoretic. There is erythema. No pallor.  Psychiatric: Her behavior is normal. Her mood appears anxious.  Appears anxious.   Nursing note and vitals reviewed.    ED Treatments / Results   DIAGNOSTIC STUDIES: Oxygen Saturation is 100% on RA, normal by my interpretation.    COORDINATION OF CARE: 9:23 PM Discussed treatment plan with pt at bedside which includes doxycycline and pt agreed to plan.    Labs (all labs ordered are listed, but only abnormal results are displayed) Labs Reviewed  PREGNANCY, URINE    EKG  EKG Interpretation None       Radiology No results found.  Procedures Procedures (including  critical care time)  Medications Ordered in ED Medications  doxycycline (VIBRA-TABS) tablet 100 mg (not administered)  diphenhydrAMINE (BENADRYL) capsule 25 mg (25 mg Oral Given 09/20/16 2133)     Initial Impression / Assessment and Plan / ED Course  I have reviewed the triage vital signs and the nursing notes.  Pertinent labs & imaging results that were available during my care  of the patient were reviewed by me and considered in my medical decision making (see chart for details).    This is a 46 y.o. female who presents to the Emergency Department complaining of an area of moderate, gradually worsening pain, pruritus, swelling and redness to the right shin that occurred 2 days ago. Pt reports associated tingling to hands and feet. Pt states she felt a bite on her right leg and the area was initially smaller. She states her pain is worsened with touch. Pt denies taking OTC medications at home to improve symptoms. Pt also reports some anxiety as a family member has previously been treated for cellulitis and she is concerned she has cellulitis. On exam the patient is afebrile nontoxic appearing. She is a small 3 cm area of erythema to the right shin. No surrounding or streaking erythema. No induration or fluctuance. No vesicles or bulla. No evidence of abscess. Sensation is intact to her bilateral upper and lower extremities. Will cover for sialitis with doxycycline. Pregnancy test is negative. I reassured the patient. I believe some of this tingling in her hands is related to her feeling anxious. She is very anxious appearing. I discussed strict and specific return precautions. I advised the patient to follow-up with their primary care provider this week. I advised the patient to return to the emergency department with new or worsening symptoms or new concerns. The patient verbalized understanding and agreement with plan.    Final Clinical Impressions(s) / ED Diagnoses   Final diagnoses:  Insect  bite, initial encounter    New Prescriptions New Prescriptions   DIPHENHYDRAMINE (BENADRYL) 25 MG TABLET    Take 1 tablet (25 mg total) by mouth every 6 (six) hours as needed for itching (Rash).   DOXYCYCLINE (VIBRAMYCIN) 100 MG CAPSULE    Take 1 capsule (100 mg total) by mouth 2 (two) times daily.    I personally performed the services described in this documentation, which was scribed in my presence. The recorded information has been reviewed and is accurate.      Waynetta Pean, PA-C 09/20/16 [place], MD 09/21/16 [phone]

## 2016-09-22 DIAGNOSIS — G43719 Chronic migraine without aura, intractable, without status migrainosus: Secondary | ICD-10-CM | POA: Diagnosis not present

## 2016-09-26 ENCOUNTER — Encounter: Payer: BLUE CROSS/BLUE SHIELD | Admitting: Physical Therapy

## 2016-10-02 ENCOUNTER — Ambulatory Visit: Payer: BLUE CROSS/BLUE SHIELD | Admitting: Physical Therapy

## 2016-10-02 ENCOUNTER — Other Ambulatory Visit: Payer: Self-pay | Admitting: Internal Medicine

## 2016-10-02 DIAGNOSIS — M25572 Pain in left ankle and joints of left foot: Secondary | ICD-10-CM | POA: Diagnosis not present

## 2016-10-02 DIAGNOSIS — M6281 Muscle weakness (generalized): Secondary | ICD-10-CM | POA: Diagnosis not present

## 2016-10-02 DIAGNOSIS — M25571 Pain in right ankle and joints of right foot: Secondary | ICD-10-CM

## 2016-10-02 DIAGNOSIS — R600 Localized edema: Secondary | ICD-10-CM | POA: Diagnosis not present

## 2016-10-02 NOTE — Patient Instructions (Signed)
Achilles / Soleus, Standing    Stand, right foot behind, heel on floor and turned slightly out. Lower hips and bend knees. Hold _30__ seconds. Repeat _3_ times per session. Do __1_ sessions per day.  Copyright  VHI. All rights reserved.  Achilles / Gastroc, Standing    Stand, right foot behind, heel on floor and turned slightly out, leg straight, forward leg bent. Move hips forward. Hold __30_ seconds. Repeat _3__ times per session. Do _1__ sessions per day.  Copyright  VHI. All rights reserved.   Leg Press (Machine)    Press forward until knees are just short of locked position. Do __3__ sets. Complete _10___ repetitions.  http://st.exer.us/337   Copyright  VHI. All rights reserved.   Knee Flexion: Hamstring Drop (Eccentric) - Seated (Weight Machine)    Sit on machine. Pull feet back until knees are at 90. Slowly release legs for 3-5 seconds. __10_ reps per set, __3_ sets per day, __2_ days per week. Add ___ lbs when you achieve ___ repetitions.  http://ecce.exer.us/109   Copyright  VHI. All rights reserved.   Knee Extension (Eccentric), (Weight Machine)    Sit on machine. Quickly extend knees. Do not lock knees. Slowly lower for 3-5 seconds. ___ reps per set, _3__ sets per day, _2__ days per week. Add ___ lbs when you achieve ___ repetitions. 10 http://ecce.exer.us/137   Copyright  VHI. All rights reserved.   EXTENSION: Standing - Resistance Band (Active)    Stand, both feet flat. Against yellow resistance band, draw right leg behind body as far as possible. Complete _2__ sets of _10__ repetitions. .  http://gtsc.exer.us/83   Copyright  VHI. All rights reserved.  ADDUCTION: Standing - Stable: Resistance Band (Active)    Stand, right leg out to side as far as possible. Against yellow resistance band, draw leg in across midline. Complete _2__ sets of _10__ repetitions. .  http://gtsc.exer.us/151   Copyright  VHI. All rights reserved.    ABDUCTION: Standing - Resistance Band (Active)    Stand, feet flat. Against yellow resistance band, lift right leg out to side. Complete _10__ sets of _10__ repetitions. .  http://gtsc.exer.us/117   Copyright  VHI. All rights reserved.

## 2016-10-02 NOTE — Therapy (Signed)
[facility] [address] [place], [place], [address] Phone: [phone]   Fax:  [phone]  Physical Therapy Treatment  Patient Details  Name: Morgan Long MRN: [id number] Date of Birth: 08/04/1970 Referring Provider: Dr. Tamala Julian  Encounter Date: 10/02/2016      PT End of Session - 10/02/16 1650    Visit Number 30   Date for PT Re-Evaluation 10/03/16   Authorization Type 30 visit limit --1 used already   PT Start Time 1619   PT Stop Time 1716   PT Time Calculation (min) 57 min   Activity Tolerance Patient limited by pain   Behavior During Therapy Morgan Long for tasks assessed/performed      Past Medical History:  Diagnosis Date  . Allergic rhinitis   . Chronic migraine    Dr. Catalina Gravel  . Dry eye syndrome of both lacrimal glands   . Endometriosis   . Gastroparesis   . GERD (gastroesophageal reflux disease)   . Headache(784.0)    occasional, dx w/ Migraines before, Topamax helps  . Localized edema   . Muscle weakness (generalized)   . Nuclear cataract of both eyes    Mild  . Pain in right ankle and joints of right foot   . Sinusitis   . TMJ pain dysfunction syndrome    occasional    Past Surgical History:  Procedure Laterality Date  . BREAST ENHANCEMENT SURGERY  2006  . endrometroisis    . fallopian tube removed     Left    There were no vitals filed for this visit.      Subjective Assessment - 10/02/16 1720    Subjective Patient understands HEP.  States she is feeling better but feels like she is progressing slowly.  She states that knee pain feels worse lately now that her ankle is feeling better.  Reports just some discomfort denies pain at rest   Currently in Pain? No/denies            [facility] PT Assessment - 10/02/16 0001      AROM   Right Ankle Dorsiflexion 6   Left Ankle Dorsiflexion 2     Strength   Right Ankle Dorsiflexion 4+/5   Right Ankle Plantar Flexion 4-/5                      [facility] Adult PT Treatment/Exercise - 10/02/16 0001      Exercises   Exercises --  educated and performed all HEP as seen in chart     Knee/Hip Exercises: Aerobic   Nustep L3 x 10 min     Knee/Hip Exercises: Machines for Strengthening   Cybex Knee Extension #25 2x10   Cybex Knee Flexion #25 2x10   Total Gym Leg Press 100# 2x10 bilateral; 50# x10     Knee/Hip Exercises: Standing   Other Standing Knee Exercises standing hip abduction, extension, adduction, flexion - red band - 10                PT Education - 10/02/16 1722    Education provided Yes   Education Details HEP as seen in chart   Person(s) Educated Patient   Methods Explanation;Demonstration   Comprehension Verbalized understanding;Returned demonstration          PT Short Term Goals - 10/02/16 1714      PT SHORT TERM GOAL #1   Title The patient will report a good understanding of self care strategies for pain  and swelling control including compression socks, orthotics, elevation, use of ice, basic ROM   04/20/16   Status Achieved     PT SHORT TERM GOAL #2   Title The patient will report a 25% improvement in bilateral ankle pain and swelling with usual ADLs   Status Achieved     PT SHORT TERM GOAL #3   Title The patient will have improved bilateral ankle dorsiflexion to 6 degrees needed for ROM to ascend and descend stairs   Baseline 6 on Rt   Status Partially Met           PT Long Term Goals - 10/02/16 1716      PT LONG TERM GOAL #1   Title The patient will be independent in safe self progression of HEP for further improvements in ROM and strength  07/28/16   Status Achieved     PT LONG TERM GOAL #2   Title The patient will report a 60% improvement in pain and swelling with home and work ADLS   Status Achieved     PT [place] #3   Title The patient will have improved ankle plantarflexion to 50 degrees, dorsiflexion to 8 degrees, eversion to 14 degrees  needed for ambulation on uneven surfaces   Status Not Met     PT LONG TERM GOAL #4   Title The patient will be able to walk > 1/2 mile for community mobility    Status Not Met     PT LONG TERM GOAL #5   Title Ankle strength grossly 4/5 needed for standing/walking longer periods of time   Status Achieved     PT LONG TERM GOAL #6   Title FOTO functional outcome score improved from 63% to 42% indicating improved function with less pain   Baseline 55%   Status Not Met               Plan - 10/02/16 1653    Clinical Impression Statement Patient has progressed to the point where she is able to continue to make slow and steady progress on her own at the gym.  Pt received information about the [facility] PREP program and handout on current HEP to progress as able while at the gym.   Rehab Potential Good   PT Treatment/Interventions ADLs/Self Care Home Management;Cryotherapy;Electrical Stimulation;Iontophoresis 63m/ml Dexamethasone;Ultrasound;Therapeutic exercise;Patient/family education;Manual techniques;Dry needling;Taping;Vasopneumatic Device   Consulted and Agree with Plan of Care Patient      Patient will benefit from skilled therapeutic intervention in order to improve the following deficits and impairments:  Pain, Increased edema, Decreased strength, Decreased range of motion  Visit Diagnosis: Pain in right ankle and joints of right foot  Pain in left ankle and joints of left foot  Localized edema  Muscle weakness (generalized)     Problem List Patient Active Problem List   Diagnosis Date Noted  . Sinusitis 07/31/2016  . Influenza-like illness 07/12/2016  . Peroneal tendinitis of left lower extremity 11/03/2015  . Pes anserine bursitis 11/03/2015  . Asthma with acute exacerbation 06/15/2015  . Acute sinusitis 06/15/2015  . PCP NOTES >>>> 02/23/2015  . GERD (gastroesophageal reflux disease) 01/13/2015  . Myalgia and myositis 07/22/2014  . 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 Paresthesias 06/02/2014  . Acute pharyngitis 05/20/2014  . Strep pharyngitis 05/20/2014  . Degenerative cervical disc 10/28/2013  . Bursitis, shoulder 09/17/2013  . Muscle spasm of back 09/11/2013  . Nonallopathic lesion of thoracic region 09/11/2013  . General medical examination 03/28/2011  .  Allergic rhinitis   . TMJ PAIN 12/16/2007  . Headache(784.0) 12/16/2007    Morgan Long , PT 10/02/2016, 5:23 PM  [facility] [address] [place], [place], [address] Phone: [phone]   Fax:  [phone]  Name: Morgan Long MRN: [id number] Date of Birth: 1971/05/12  PHYSICAL THERAPY DISCHARGE SUMMARY  Visits from Start of Care: 30  Current functional level related to goals / functional outcomes: See goals   Remaining deficits: See above goals   Education / Equipment: HEP  Plan: Patient agrees to discharge.  Patient goals were partially met. Patient is being discharged due to lack of progress.  ?????         Morgan Long, PT 10/02/16 5:25 PM

## 2016-10-03 ENCOUNTER — Ambulatory Visit: Payer: BLUE CROSS/BLUE SHIELD | Admitting: Physical Therapy

## 2016-10-09 ENCOUNTER — Encounter: Payer: Self-pay | Admitting: Internal Medicine

## 2016-10-09 ENCOUNTER — Ambulatory Visit (INDEPENDENT_AMBULATORY_CARE_PROVIDER_SITE_OTHER): Payer: BLUE CROSS/BLUE SHIELD | Admitting: Internal Medicine

## 2016-10-09 VITALS — BP 108/70 | HR 99 | Ht 63.0 in | Wt 203.0 lb

## 2016-10-09 DIAGNOSIS — K581 Irritable bowel syndrome with constipation: Secondary | ICD-10-CM | POA: Diagnosis not present

## 2016-10-09 DIAGNOSIS — K219 Gastro-esophageal reflux disease without esophagitis: Secondary | ICD-10-CM

## 2016-10-09 DIAGNOSIS — K3184 Gastroparesis: Secondary | ICD-10-CM

## 2016-10-09 MED ORDER — AMBULATORY NON FORMULARY MEDICATION: 1 refills | Status: DC

## 2016-10-09 NOTE — Progress Notes (Signed)
Subjective:    Patient ID: Morgan Long, female    DOB: May 15, 1971, 46 y.o.   MRN: [id number]  HPI Morgan Long is a 46 year old female with a history of GERD, gastroparesis, chronic constipation who is seen in follow-up. She was last seen on 07/06/2016 by Nicoletta Ba, PA-C. She is here alone today. After her visit with Amy domperidone was added for gastroparesis type symptoms specifically nausea, epigastric pain and bloating. Reglan had given her some eyelid and facial muscle twitches. These symptoms have resolved completely and not recurred. She's been using domperidone 10 mg approximately 3-4 times per day. She's also using Linzess 72 g but not on daily basis.  She reports overall she feels that she is doing better but she feels like that her stomach is "never empty". It can be uncomfortable particularly after meals and she feels the urge to belch but is often unable to. She states that "if I could burp I would feel better". She's tried over-the-counter Gas-X without much benefit. Nausea is definitively better with the domperidone. No vomiting. She is occasionally used 145 g of Linzess when she feels her constipation is worsened. She tries to avoid doing this at work because she has a bit of a "germ phobia". She was recently treated with doxycycline for a spider bite and she reports this was hard on her stomach. She has completed this antibiotic therapy and her rash has resolved. She reports that constipation is been a lifelong problem for her dating back to when she was a kid. She denies blood in her stool and melena.  Review of Systems As per history of present illness, otherwise negative  Current Medications, Allergies, Past Medical History, Past Surgical History, Family History and Social History were reviewed in [employer] record.     Objective:   Physical Exam BP 108/70   Pulse 99   Ht 5\' 3"  (1.6 m)   Wt 203 lb (92.1 kg)   BMI 35.96 kg/m    Constitutional: Well-developed and well-nourished. No distress. HEENT: Normocephalic and atraumatic. Marland Kitchen Conjunctivae are normal.  No scleral icterus. Neck: Neck supple. Trachea midline. Cardiovascular: Normal rate, regular rhythm and intact distal pulses. No M/R/G Pulmonary/chest: Effort normal and breath sounds normal. No wheezing, rales or rhonchi. Abdominal: Soft, nontender, nondistended. Bowel sounds active throughout. There are no masses palpable. No hepatosplenomegaly. Extremities: no clubbing, cyanosis, or edema Neurological: Alert and oriented to person place and time. Skin: Skin is warm and dry. Psychiatric: Normal mood and affect. Behavior is normal.     Assessment & Plan:  46 year old female with a history of GERD, gastroparesis, chronic constipation who is seen in follow-up  1. Gastroparesis -- her symptoms of nausea, abdominal bloating and fullness are all related to her gastroparesis. Domperidone has been beneficial but she has not been 100% consistent with it. I've instructed that she can use 20 mg before meals if needed. 10 mg at bedtime is the recommended dose. We reviewed gastroparesis diet which she reports she tries to follow. When symptoms flare she should reduce to a level II or even level I gastroparesis diet as needed.  2. Chronic constipation/IBS constipation -- she has not been consistent with Linzess and I explained that this medication works best when taken on a daily basis. With this I would expect that she have a bowel movement 4-7 days per week. I'm hesitant to increase the dose given that she has not used this consistently. She will try 72 g on a  daily basis 30 minutes before breakfast. If this fails to her improve her constipation we can then increase back to 145 g dose. She is happy with this plan.  3. GERD -- Zantac is been helpful. Continue ranitidine 150 mg twice a day.  4. Colon cancer screening -- average risk, begin colonoscopy at age 34   6-12 month  ROV 25 minutes spent with the patient today. Greater than 50% was spent in counseling and coordination of care with the patient

## 2016-10-09 NOTE — Patient Instructions (Signed)
Continue Linzess 72 mcg 1 tablet by mouth once every day on a consistent basis.  Continue domperidone 10 mg three times daily before meals and at bedtime. You may increase to 20 mg before meals if needed (maximum of 7 tablets daily).  Please follow the gastroparesis diet that you already have been given.  Follow up with Dr Hilarie Fredrickson in 4 months.  If you are age 48 or older, your body mass index should be between 23-30. Your Body mass index is 35.96 kg/m. If this is out of the aforementioned range listed, please consider follow up with your Primary Care Provider.  If you are age 74 or younger, your body mass index should be between 19-25. Your Body mass index is 35.96 kg/m. If this is out of the aformentioned range listed, please consider follow up with your Primary Care Provider.

## 2016-10-11 ENCOUNTER — Telehealth: Payer: Self-pay

## 2016-10-11 NOTE — Telephone Encounter (Signed)
Called and left VM today and on 10/05/16 on cell number listed regarding referral from out patient PT into the PREP.  Called the home number as well but the person that answered said she's not had that number for about 3 years.

## 2016-11-02 ENCOUNTER — Other Ambulatory Visit: Payer: Self-pay | Admitting: Family Medicine

## 2016-11-02 DIAGNOSIS — R635 Abnormal weight gain: Secondary | ICD-10-CM | POA: Diagnosis not present

## 2016-11-02 DIAGNOSIS — N951 Menopausal and female climacteric states: Secondary | ICD-10-CM | POA: Diagnosis not present

## 2016-11-03 DIAGNOSIS — R7303 Prediabetes: Secondary | ICD-10-CM | POA: Diagnosis not present

## 2016-11-03 DIAGNOSIS — G479 Sleep disorder, unspecified: Secondary | ICD-10-CM | POA: Diagnosis not present

## 2016-11-03 DIAGNOSIS — E559 Vitamin D deficiency, unspecified: Secondary | ICD-10-CM | POA: Diagnosis not present

## 2016-11-03 DIAGNOSIS — E669 Obesity, unspecified: Secondary | ICD-10-CM | POA: Diagnosis not present

## 2016-11-03 NOTE — Telephone Encounter (Signed)
Not on current list 

## 2016-11-08 ENCOUNTER — Encounter: Payer: Self-pay | Admitting: Family Medicine

## 2016-11-12 ENCOUNTER — Encounter: Payer: Self-pay | Admitting: Family Medicine

## 2016-11-13 ENCOUNTER — Other Ambulatory Visit: Payer: Self-pay | Admitting: Obstetrics and Gynecology

## 2016-11-13 DIAGNOSIS — Z1231 Encounter for screening mammogram for malignant neoplasm of breast: Secondary | ICD-10-CM

## 2016-11-16 DIAGNOSIS — E559 Vitamin D deficiency, unspecified: Secondary | ICD-10-CM | POA: Diagnosis not present

## 2016-11-16 DIAGNOSIS — E669 Obesity, unspecified: Secondary | ICD-10-CM | POA: Diagnosis not present

## 2016-11-16 DIAGNOSIS — R7303 Prediabetes: Secondary | ICD-10-CM | POA: Diagnosis not present

## 2016-11-23 NOTE — Telephone Encounter (Signed)
Pt called regarding this, she would like the corrected notes faxed to her Fax# [phone]  She would also like a call back to make sure the right thing is sent over

## 2016-11-24 ENCOUNTER — Ambulatory Visit: Payer: BLUE CROSS/BLUE SHIELD

## 2016-11-24 DIAGNOSIS — M25572 Pain in left ankle and joints of left foot: Secondary | ICD-10-CM | POA: Diagnosis not present

## 2016-11-24 DIAGNOSIS — M25372 Other instability, left ankle: Secondary | ICD-10-CM | POA: Diagnosis not present

## 2016-11-24 DIAGNOSIS — R7303 Prediabetes: Secondary | ICD-10-CM | POA: Diagnosis not present

## 2016-11-24 DIAGNOSIS — E559 Vitamin D deficiency, unspecified: Secondary | ICD-10-CM | POA: Diagnosis not present

## 2016-11-24 DIAGNOSIS — M25371 Other instability, right ankle: Secondary | ICD-10-CM | POA: Diagnosis not present

## 2016-11-24 DIAGNOSIS — E669 Obesity, unspecified: Secondary | ICD-10-CM | POA: Diagnosis not present

## 2016-12-01 ENCOUNTER — Ambulatory Visit
Admission: RE | Admit: 2016-12-01 | Discharge: 2016-12-01 | Disposition: A | Discharge status: Home / Self Care | Payer: BLUE CROSS/BLUE SHIELD | Source: Ambulatory Visit | Attending: Obstetrics and Gynecology | Admitting: Obstetrics and Gynecology

## 2016-12-01 DIAGNOSIS — Z1231 Encounter for screening mammogram for malignant neoplasm of breast: Secondary | ICD-10-CM | POA: Diagnosis not present

## 2016-12-04 DIAGNOSIS — M25572 Pain in left ankle and joints of left foot: Secondary | ICD-10-CM | POA: Diagnosis not present

## 2016-12-04 DIAGNOSIS — M19072 Primary osteoarthritis, left ankle and foot: Secondary | ICD-10-CM | POA: Diagnosis not present

## 2016-12-04 DIAGNOSIS — M659 Synovitis and tenosynovitis, unspecified: Secondary | ICD-10-CM | POA: Diagnosis not present

## 2016-12-06 ENCOUNTER — Other Ambulatory Visit: Payer: Self-pay | Admitting: Physician Assistant

## 2016-12-07 ENCOUNTER — Telehealth: Payer: Self-pay | Admitting: *Deleted

## 2016-12-07 NOTE — Telephone Encounter (Signed)
Per Madlyn Frankel CMA, OK to refill Zantac 150 mg, 1 refill, under Dr. Zenovia Jarred.  Dr. Hilarie Fredrickson did note in his last office note from 10/09/2016 the patient can continue Zantac 150 mg , twice daily.

## 2016-12-15 DIAGNOSIS — R7303 Prediabetes: Secondary | ICD-10-CM | POA: Diagnosis not present

## 2016-12-15 DIAGNOSIS — E669 Obesity, unspecified: Secondary | ICD-10-CM | POA: Diagnosis not present

## 2016-12-15 DIAGNOSIS — E559 Vitamin D deficiency, unspecified: Secondary | ICD-10-CM | POA: Diagnosis not present

## 2016-12-19 ENCOUNTER — Encounter: Payer: Self-pay | Admitting: Internal Medicine

## 2016-12-19 DIAGNOSIS — L219 Seborrheic dermatitis, unspecified: Secondary | ICD-10-CM | POA: Diagnosis not present

## 2016-12-19 DIAGNOSIS — D2230 Melanocytic nevi of unspecified part of face: Secondary | ICD-10-CM | POA: Diagnosis not present

## 2016-12-22 DIAGNOSIS — E669 Obesity, unspecified: Secondary | ICD-10-CM | POA: Diagnosis not present

## 2016-12-22 DIAGNOSIS — R609 Edema, unspecified: Secondary | ICD-10-CM | POA: Diagnosis not present

## 2016-12-22 DIAGNOSIS — M25572 Pain in left ankle and joints of left foot: Secondary | ICD-10-CM | POA: Diagnosis not present

## 2016-12-22 DIAGNOSIS — M65871 Other synovitis and tenosynovitis, right ankle and foot: Secondary | ICD-10-CM | POA: Diagnosis not present

## 2016-12-22 DIAGNOSIS — M19072 Primary osteoarthritis, left ankle and foot: Secondary | ICD-10-CM | POA: Diagnosis not present

## 2016-12-22 DIAGNOSIS — E559 Vitamin D deficiency, unspecified: Secondary | ICD-10-CM | POA: Diagnosis not present

## 2016-12-22 DIAGNOSIS — R7303 Prediabetes: Secondary | ICD-10-CM | POA: Diagnosis not present

## 2016-12-22 DIAGNOSIS — M659 Synovitis and tenosynovitis, unspecified: Secondary | ICD-10-CM | POA: Diagnosis not present

## 2016-12-28 ENCOUNTER — Other Ambulatory Visit: Payer: Self-pay | Admitting: Family Medicine

## 2016-12-28 ENCOUNTER — Telehealth: Payer: Self-pay | Admitting: Internal Medicine

## 2016-12-28 ENCOUNTER — Telehealth: Payer: Self-pay | Admitting: Family Medicine

## 2016-12-28 DIAGNOSIS — W57XXXD Bitten or stung by nonvenomous insect and other nonvenomous arthropods, subsequent encounter: Secondary | ICD-10-CM

## 2016-12-28 NOTE — Telephone Encounter (Signed)
Is she having any systemic symptoms--- fever, fatigue etc?   Unless she has traveled outside the country there is no specific disease to test for.  We can check cbcd, cmp

## 2016-12-28 NOTE — Telephone Encounter (Signed)
Spoke with the patient. She admits she has not followed the gastroparesis diet. She has not increased the Domperidone to 20 mg either. She is experiencing bloating and abdominal discomfort with gas. She agrees to try the recommendations made at her last office visit. She will fall back to the step 1 gastroparesis diet and advance quickly as tolerated. Call back if she acutely worsens or she fails to improve.

## 2016-12-28 NOTE — Telephone Encounter (Signed)
Caller name:Nishi Pomeroy Relationship to patient: Can be reached:[phone] Pharmacy:  Reason for call:keeps getting bit by mosquitos. Would like a blood test to make sure everything is ok. Please advise

## 2016-12-28 NOTE — Telephone Encounter (Signed)
Spoke to the patient and she is having a little fatigue. She did want to go ahead and check the cbc and cmp. She states constantly getting bit, usually starts out small and by the next day a much larger place. Orders placed/lab appt made.

## 2016-12-29 ENCOUNTER — Other Ambulatory Visit (INDEPENDENT_AMBULATORY_CARE_PROVIDER_SITE_OTHER): Payer: BLUE CROSS/BLUE SHIELD

## 2016-12-29 DIAGNOSIS — W57XXXD Bitten or stung by nonvenomous insect and other nonvenomous arthropods, subsequent encounter: Secondary | ICD-10-CM | POA: Diagnosis not present

## 2016-12-29 DIAGNOSIS — E669 Obesity, unspecified: Secondary | ICD-10-CM | POA: Diagnosis not present

## 2016-12-29 DIAGNOSIS — T148XXA Other injury of unspecified body region, initial encounter: Secondary | ICD-10-CM | POA: Diagnosis not present

## 2016-12-29 DIAGNOSIS — R7303 Prediabetes: Secondary | ICD-10-CM | POA: Diagnosis not present

## 2016-12-29 LAB — COMPREHENSIVE METABOLIC PANEL
ALT: 15 U/L (ref 0–35)
AST: 12 U/L (ref 0–37)
Albumin: 3.7 g/dL (ref 3.5–5.2)
Alkaline Phosphatase: 60 U/L (ref 39–117)
BUN: 16 mg/dL (ref 6–23)
CHLORIDE: 104 meq/L (ref 96–112)
CO2: 24 meq/L (ref 19–32)
CREATININE: 0.86 mg/dL (ref 0.40–1.20)
Calcium: 8.7 mg/dL (ref 8.4–10.5)
GFR: 90.51 mL/min (ref >60.00)
Glucose, Bld: 113 mg/dL — ABNORMAL HIGH (ref 70–99)
Potassium: 3.7 mEq/L (ref 3.5–5.1)
SODIUM: 137 meq/L (ref 135–145)
Total Bilirubin: 0.4 mg/dL (ref 0.2–1.2)
Total Protein: 7.2 g/dL (ref 6.0–8.3)

## 2016-12-29 LAB — CBC WITH DIFFERENTIAL/PLATELET
Basophils Absolute: 0.5 10*3/uL — ABNORMAL HIGH (ref 0.0–0.1)
Basophils Relative: 4.2 % — ABNORMAL HIGH (ref 0.0–3.0)
EOS ABS: 0.1 10*3/uL (ref 0.0–0.7)
Eosinophils Relative: 0.4 % (ref 0.0–5.0)
HCT: 39.9 % (ref 36.0–46.0)
Hemoglobin: 12.8 g/dL (ref 12.0–15.0)
Lymphocytes Relative: 23.7 % (ref 12.0–46.0)
Lymphs Abs: 2.7 10*3/uL (ref 0.7–4.0)
MCHC: 32.2 g/dL (ref 30.0–36.0)
MCV: 91 fl (ref 78.0–100.0)
MONO ABS: 0.7 10*3/uL (ref 0.1–1.0)
Monocytes Relative: 6.4 % (ref 3.0–12.0)
NEUTROS ABS: 7.4 10*3/uL (ref 1.4–7.7)
Neutrophils Relative %: 65.3 % (ref 43.0–77.0)
PLATELETS: 378 10*3/uL (ref 150.0–400.0)
RBC: 4.38 Mil/uL (ref 3.87–5.11)
RDW: 13.6 % (ref 11.5–15.5)
WBC: 11.3 10*3/uL — AB (ref 4.0–10.5)

## 2017-01-01 ENCOUNTER — Other Ambulatory Visit: Payer: Self-pay | Admitting: Family Medicine

## 2017-01-01 DIAGNOSIS — D72829 Elevated white blood cell count, unspecified: Secondary | ICD-10-CM

## 2017-01-02 DIAGNOSIS — M65872 Other synovitis and tenosynovitis, left ankle and foot: Secondary | ICD-10-CM | POA: Diagnosis not present

## 2017-01-12 DIAGNOSIS — H2513 Age-related nuclear cataract, bilateral: Secondary | ICD-10-CM | POA: Diagnosis not present

## 2017-01-12 DIAGNOSIS — H43393 Other vitreous opacities, bilateral: Secondary | ICD-10-CM | POA: Diagnosis not present

## 2017-01-12 DIAGNOSIS — H04123 Dry eye syndrome of bilateral lacrimal glands: Secondary | ICD-10-CM | POA: Diagnosis not present

## 2017-01-12 DIAGNOSIS — H16223 Keratoconjunctivitis sicca, not specified as Sjogren's, bilateral: Secondary | ICD-10-CM | POA: Diagnosis not present

## 2017-01-16 ENCOUNTER — Other Ambulatory Visit: Payer: Self-pay | Admitting: Family Medicine

## 2017-01-16 NOTE — Telephone Encounter (Signed)
Last office visit on 07/31/2016 Last refill on 11/03/2016  45g

## 2017-01-18 ENCOUNTER — Encounter: Payer: Self-pay | Admitting: Family Medicine

## 2017-01-18 ENCOUNTER — Encounter: Payer: Self-pay | Admitting: Internal Medicine

## 2017-01-19 DIAGNOSIS — L918 Other hypertrophic disorders of the skin: Secondary | ICD-10-CM | POA: Diagnosis not present

## 2017-01-19 DIAGNOSIS — D2239 Melanocytic nevi of other parts of face: Secondary | ICD-10-CM | POA: Diagnosis not present

## 2017-01-19 NOTE — Telephone Encounter (Signed)
Glucose was only slightly elevated-- if she was not fasting that is why---- it is not in diabetic range if she was fasting she would just need to watch simple sugars and starches

## 2017-01-19 NOTE — Telephone Encounter (Signed)
Please call the patient and advise her to have colonoscopy scheduled to evaluate blood in stool If this continues she should be seen in the office by me or advanced practitioner

## 2017-01-22 ENCOUNTER — Encounter: Payer: Self-pay | Admitting: Internal Medicine

## 2017-01-23 DIAGNOSIS — M25572 Pain in left ankle and joints of left foot: Secondary | ICD-10-CM | POA: Diagnosis not present

## 2017-01-23 DIAGNOSIS — M25571 Pain in right ankle and joints of right foot: Secondary | ICD-10-CM | POA: Diagnosis not present

## 2017-01-24 DIAGNOSIS — N760 Acute vaginitis: Secondary | ICD-10-CM | POA: Diagnosis not present

## 2017-01-24 DIAGNOSIS — E559 Vitamin D deficiency, unspecified: Secondary | ICD-10-CM | POA: Diagnosis not present

## 2017-01-26 ENCOUNTER — Ambulatory Visit (AMBULATORY_SURGERY_CENTER): Payer: Self-pay | Admitting: *Deleted

## 2017-01-26 VITALS — Ht 63.0 in | Wt 207.0 lb

## 2017-01-26 DIAGNOSIS — K921 Melena: Secondary | ICD-10-CM

## 2017-01-26 MED ORDER — NA SULFATE-K SULFATE-MG SULF 17.5-3.13-1.6 GM/177ML PO SOLN: ORAL | 0 refills | Status: DC

## 2017-01-26 NOTE — Progress Notes (Signed)
Patient denies any allergies to eggs or soy. Patient denies any problems with anesthesia/sedation. Patient denies any oxygen use at home patient instructed to stop diet/weight loss medications 10 days prior to colonoscopy. EMMI education assisgned to patient on colonoscopy, this was explained and instructions given to patient.

## 2017-01-31 ENCOUNTER — Telehealth: Payer: Self-pay | Admitting: Internal Medicine

## 2017-01-31 NOTE — Telephone Encounter (Signed)
Patient states suprep for colon on 9.6.18 is not at pharmacy and is requesting the order be sent again. Pt had pre visit on 8.24.18 and needs suprep sent to [facility]

## 2017-01-31 NOTE — Telephone Encounter (Signed)
Spoke with pharmacy, pharmacist states Suprep was on hold for prior authorization. Notified her that we don't do that. Gave pharmacist $50 coupon over the phone. Notified patient that she may pick Suprep up today.

## 2017-02-07 ENCOUNTER — Telehealth: Payer: Self-pay | Admitting: Internal Medicine

## 2017-02-07 ENCOUNTER — Other Ambulatory Visit: Payer: Self-pay

## 2017-02-07 DIAGNOSIS — R110 Nausea: Secondary | ICD-10-CM

## 2017-02-07 MED ORDER — ONDANSETRON HCL 4 MG PO TABS: 4.0000 mg | ORAL_TABLET | ORAL | 0 refills | Status: DC | PRN

## 2017-02-07 MED ORDER — ONDANSETRON HCL 4 MG PO TABS: 4.0000 mg | ORAL_TABLET | Freq: Three times a day (TID) | ORAL | 0 refills | Status: DC | PRN

## 2017-02-07 NOTE — Telephone Encounter (Signed)
Returned patients call. Patient was feeling nauseous prior to beginning her prep for her colonoscopy. Dr. Hilarie Fredrickson was notified. Patient was informed that she could take Domeperidone 10 mg prior to beginning her prep. Zofran 4 mg x 4 tablets was sent to [facility]. Patient was instructed to take 8 mg before each half of her prep. Patient verbalizes understanding.   Riki Sheer, LPN

## 2017-02-07 NOTE — Telephone Encounter (Signed)
Patient has domperidone 10 mg which should help with gastric emptying and nausea She should use this before each half of her prep Can also give Zofran 8 mg before each half of the prep

## 2017-02-08 ENCOUNTER — Encounter: Payer: Self-pay | Admitting: Internal Medicine

## 2017-02-08 ENCOUNTER — Ambulatory Visit (AMBULATORY_SURGERY_CENTER): Payer: BLUE CROSS/BLUE SHIELD | Admitting: Internal Medicine

## 2017-02-08 VITALS — BP 108/67 | HR 78 | Temp 98.6°F | Resp 18 | Ht 63.0 in | Wt 203.0 lb

## 2017-02-08 DIAGNOSIS — Z1211 Encounter for screening for malignant neoplasm of colon: Secondary | ICD-10-CM | POA: Diagnosis present

## 2017-02-08 MED ORDER — SODIUM CHLORIDE 0.9 % IV SOLN: 500.0000 mL | INTRAVENOUS | Status: DC

## 2017-02-08 NOTE — Progress Notes (Signed)
Pt's states no medical or surgical changes since previsit or office visit.  Maw  Pt first reported she last drank liquids at 11:30.  After I finished checking her in, pt asked why it mattered when she last drank.  I explained she would having deep sedation and it was possible she could aspirate fluid into her lungs.  Pt then reported she last drank approximately 4 oz of apple juice at 12:00.  She repeated nothing liquid by mouth after 12:00. maw

## 2017-02-08 NOTE — Progress Notes (Signed)
A/ox3 pleased with MAC, report to Jane RN 

## 2017-02-08 NOTE — Progress Notes (Signed)
Pt. Up to bathroom.  Passed more air. States she feels better.

## 2017-02-08 NOTE — Op Note (Signed)
[place] Patient Name: Morgan Long Procedure Date: 02/08/2017 2:23 PM MRN: [id number] Endoscopist: Jerene Bears , MD Age: 46 Referring MD:  Date of Birth: 1971-05-21 Gender: Female Account #: [id number] Procedure:                Colonoscopy Indications:              Screening for colorectal malignant neoplasm, This                            is the patient's first colonoscopy Medicines:                Monitored Anesthesia Care Procedure:                Pre-Anesthesia Assessment:                           - Prior to the procedure, a History and Physical                            was performed, and patient medications and                            allergies were reviewed. The patient's tolerance of                            previous anesthesia was also reviewed. The risks                            and benefits of the procedure and the sedation                            options and risks were discussed with the patient.                            All questions were answered, and informed consent                            was obtained. Prior Anticoagulants: The patient has                            taken no previous anticoagulant or antiplatelet                            agents. ASA Grade Assessment: II - A patient with                            mild systemic disease. After reviewing the risks                            and benefits, the patient was deemed in                            satisfactory condition to undergo the procedure.  After obtaining informed consent, the colonoscope                            was passed under direct vision. Throughout the                            procedure, the patient's blood pressure, pulse, and                            oxygen saturations were monitored continuously. The                            Colonoscope was introduced through the anus and                            advanced to the the terminal  ileum. The colonoscopy                            was performed without difficulty. The patient                            tolerated the procedure well. The quality of the                            bowel preparation was excellent. The terminal                            ileum, ileocecal valve, appendiceal orifice, and                            rectum were photographed. Scope In: 2:47:23 PM Scope Out: 3:03:34 PM Scope Withdrawal Time: 0 hours 11 minutes 14 seconds  Total Procedure Duration: 0 hours 16 minutes 11 seconds  Findings:                 The digital rectal exam was normal.                           The terminal ileum appeared normal.                           Internal hemorrhoids were found during                            retroflexion. The hemorrhoids were small.                           The exam was otherwise without abnormality. Complications:            No immediate complications. Estimated Blood Loss:     Estimated blood loss: none. Impression:               - The examined portion of the ileum was normal.                           - Small internal hemorrhoids.                           -  The examination was otherwise normal.                           - No specimens collected. Recommendation:           - Patient has a contact number available for                            emergencies. The signs and symptoms of potential                            delayed complications were discussed with the                            patient. Return to normal activities tomorrow.                            Written discharge instructions were provided to the                            patient.                           - Resume previous diet.                           - Continue present medications.                           - Repeat colonoscopy in 10 years for screening                            purposes. Jerene Bears, MD 02/08/2017 3:10:26 PM This report has been signed  electronically.

## 2017-02-08 NOTE — Patient Instructions (Addendum)
YOU HAD AN ENDOSCOPIC PROCEDURE TODAY AT [facility]:   Refer to the procedure report that was given to you for any specific questions about what was found during the examination.  If the procedure report does not answer your questions, please call your gastroenterologist to clarify.  If you requested that your care partner not be given the details of your procedure findings, then the procedure report has been included in a sealed envelope for you to review at your convenience later.  YOU SHOULD EXPECT: Some feelings of bloating in the abdomen. Passage of more gas than usual.  Walking can help get rid of the air that was put into your GI tract during the procedure and reduce the bloating. If you had a lower endoscopy (such as a colonoscopy or flexible sigmoidoscopy) you may notice spotting of blood in your stool or on the toilet paper. If you underwent a bowel prep for your procedure, you may not have a normal bowel movement for a few days.  Please Note:  You might notice some irritation and congestion in your nose or some drainage.  This is from the oxygen used during your procedure.  There is no need for concern and it should clear up in a day or so.  SYMPTOMS TO REPORT IMMEDIATELY:   Following lower endoscopy (colonoscopy or flexible sigmoidoscopy):  Excessive amounts of blood in the stool  Significant tenderness or worsening of abdominal pains  Swelling of the abdomen that is new, acute  Fever of 100F or higher   For urgent or emergent issues, a gastroenterologist can be reached at any hour by calling [phone].   DIET:  We do recommend a small meal at first, but then you may proceed to your regular diet.  Drink plenty of fluids but you should avoid alcoholic beverages for 24 hours.  ACTIVITY:  You should plan to take it easy for the rest of today and you should NOT DRIVE or use heavy machinery until tomorrow (because of the sedation medicines used during the test).     FOLLOW UP: Our staff will call the number listed on your records the next business day following your procedure to check on you and address any questions or concerns that you may have regarding the information given to you following your procedure. If we do not reach you, we will leave a message.  However, if you are feeling well and you are not experiencing any problems, there is no need to return our call.  We will assume that you have returned to your regular daily activities without incident.  If any biopsies were taken you will be contacted by phone or by letter within the next 1-3 weeks.  Please call us at [phone] if you have not heard about the biopsies in 3 weeks.    SIGNATURES/CONFIDENTIALITY: You and/or your care partner have signed paperwork which will be entered into your electronic medical record.  These signatures attest to the fact that that the information above on your After Visit Summary has been reviewed and is understood.  Full responsibility of the confidentiality of this discharge information lies with you and/or your care-partner.  Hemorrhoid information given. Next colonoscopy 10 years 2028.YOU HAD AN ENDOSCOPIC PROCEDURE TODAY AT [facility]:   Refer to the procedure report that was given to you for any specific questions about what was found during the examination.  If the procedure report does not answer your questions, please call your gastroenterologist  to clarify.  If you requested that your care partner not be given the details of your procedure findings, then the procedure report has been included in a sealed envelope for you to review at your convenience later.  YOU SHOULD EXPECT: Some feelings of bloating in the abdomen. Passage of more gas than usual.  Walking can help get rid of the air that was put into your GI tract during the procedure and reduce the bloating. If you had a lower endoscopy (such as a colonoscopy or flexible sigmoidoscopy)  you may notice spotting of blood in your stool or on the toilet paper. If you underwent a bowel prep for your procedure, you may not have a normal bowel movement for a few days.  Please Note:  You might notice some irritation and congestion in your nose or some drainage.  This is from the oxygen used during your procedure.  There is no need for concern and it should clear up in a day or so.  SYMPTOMS TO REPORT IMMEDIATELY:   Following lower endoscopy (colonoscopy or flexible sigmoidoscopy):  Excessive amounts of blood in the stool  Significant tenderness or worsening of abdominal pains  Swelling of the abdomen that is new, acute  Fever of 100F or higher   Following upper endoscopy (EGD)  Vomiting of blood or coffee ground material  New chest pain or pain under the shoulder blades  Painful or persistently difficult swallowing  New shortness of breath  Fever of 100F or higher  Black, tarry-looking stools  For urgent or emergent issues, a gastroenterologist can be reached at any hour by calling [phone].   DIET:  We do recommend a small meal at first, but then you may proceed to your regular diet.  Drink plenty of fluids but you should avoid alcoholic beverages for 24 hours.  ACTIVITY:  You should plan to take it easy for the rest of today and you should NOT DRIVE or use heavy machinery until tomorrow (because of the sedation medicines used during the test).    FOLLOW UP: Our staff will call the number listed on your records the next business day following your procedure to check on you and address any questions or concerns that you may have regarding the information given to you following your procedure. If we do not reach you, we will leave a message.  However, if you are feeling well and you are not experiencing any problems, there is no need to return our call.  We will assume that you have returned to your regular daily activities without incident.  If any biopsies were  taken you will be contacted by phone or by letter within the next 1-3 weeks.  Please call us at [phone] if you have not heard about the biopsies in 3 weeks.    SIGNATURES/CONFIDENTIALITY: You and/or your care partner have signed paperwork which will be entered into your electronic medical record.  These signatures attest to the fact that that the information above on your After Visit Summary has been reviewed and is understood.  Full responsibility of the confidentiality of this discharge information lies with you and/or your care-partner.

## 2017-02-09 ENCOUNTER — Telehealth: Payer: Self-pay

## 2017-02-09 ENCOUNTER — Telehealth: Payer: Self-pay | Admitting: *Deleted

## 2017-02-09 NOTE — Telephone Encounter (Signed)
  Follow up Call-  Call back number 02/08/2017 02/03/2015  Post procedure Call Back phone  # [phone] cell [phone]  Permission to leave phone message Yes Yes  Some recent data might be hidden     Patient questions:  Do you have a fever, pain , or abdominal swelling? No. Pain Score  0 *  Have you tolerated food without any problems? Yes.    Have you been able to return to your normal activities? Yes.    Do you have any questions about your discharge instructions: Diet   No. Medications  No. Follow up visit  No.  Do you have questions or concerns about your Care? No.  Actions: * If pain score is 4 or above: No action needed, pain <4.

## 2017-02-09 NOTE — Telephone Encounter (Signed)
  Follow up Call-  Call back number 02/08/2017 02/03/2015  Post procedure Call Back phone  # [phone] cell [phone]  Permission to leave phone message Yes Yes  Some recent data might be hidden     Left message

## 2017-02-13 ENCOUNTER — Other Ambulatory Visit: Payer: Self-pay | Admitting: Allergy and Immunology

## 2017-02-13 DIAGNOSIS — J45901 Unspecified asthma with (acute) exacerbation: Secondary | ICD-10-CM

## 2017-02-14 ENCOUNTER — Other Ambulatory Visit: Payer: Self-pay | Admitting: Allergy and Immunology

## 2017-02-14 DIAGNOSIS — J45901 Unspecified asthma with (acute) exacerbation: Secondary | ICD-10-CM

## 2017-02-14 MED ORDER — BECLOMETHASONE DIPROPIONATE 80 MCG/ACT IN AERS: INHALATION_SPRAY | RESPIRATORY_TRACT | 0 refills | Status: DC

## 2017-02-14 NOTE — Telephone Encounter (Signed)
RF for Qvar 80 mcg given with no refills, pt needs an OV

## 2017-02-20 ENCOUNTER — Encounter: Payer: Self-pay | Admitting: Family Medicine

## 2017-02-20 ENCOUNTER — Telehealth: Payer: Self-pay | Admitting: Family Medicine

## 2017-02-20 DIAGNOSIS — G43809 Other migraine, not intractable, without status migrainosus: Secondary | ICD-10-CM

## 2017-02-20 NOTE — Telephone Encounter (Signed)
Ok to refer.

## 2017-02-20 NOTE — Telephone Encounter (Signed)
Pt is requesting a referral to [facility] neurology for her migraines.

## 2017-02-21 NOTE — Telephone Encounter (Signed)
Referral placed.

## 2017-02-21 NOTE — Telephone Encounter (Signed)
Pt has appt 02/27/2017 w/ PCP to discuss migraines and paperwork.

## 2017-02-22 NOTE — Telephone Encounter (Signed)
Left message to call back to inform pt that referral was placed

## 2017-02-23 DIAGNOSIS — M25572 Pain in left ankle and joints of left foot: Secondary | ICD-10-CM | POA: Diagnosis not present

## 2017-02-23 DIAGNOSIS — M25571 Pain in right ankle and joints of right foot: Secondary | ICD-10-CM | POA: Diagnosis not present

## 2017-02-27 ENCOUNTER — Ambulatory Visit (INDEPENDENT_AMBULATORY_CARE_PROVIDER_SITE_OTHER): Payer: BLUE CROSS/BLUE SHIELD | Admitting: Family Medicine

## 2017-02-27 VITALS — BP 106/74 | Wt 209.0 lb

## 2017-02-27 DIAGNOSIS — G43709 Chronic migraine without aura, not intractable, without status migrainosus: Secondary | ICD-10-CM

## 2017-02-27 DIAGNOSIS — E669 Obesity, unspecified: Secondary | ICD-10-CM | POA: Diagnosis not present

## 2017-02-27 DIAGNOSIS — D72829 Elevated white blood cell count, unspecified: Secondary | ICD-10-CM | POA: Diagnosis not present

## 2017-02-27 DIAGNOSIS — Z23 Encounter for immunization: Secondary | ICD-10-CM | POA: Diagnosis not present

## 2017-02-27 NOTE — Patient Instructions (Signed)
Migraine Headache A migraine headache is an intense, throbbing pain on one side or both sides of the head. Migraines may also cause other symptoms, such as nausea, vomiting, and sensitivity to light and noise. What are the causes? Doing or taking certain things may also trigger migraines, such as:  Alcohol.  Smoking.  Medicines, such as: ? Medicine used to treat chest pain (nitroglycerine). ? Birth control pills. ? Estrogen pills. ? Certain blood pressure medicines.  Aged cheeses, chocolate, or caffeine.  Foods or drinks that contain nitrates, glutamate, aspartame, or tyramine.  Physical activity.  Other things that may trigger a migraine include:  Menstruation.  Pregnancy.  Hunger.  Stress, lack of sleep, too much sleep, or fatigue.  Weather changes.  What increases the risk? The following factors may make you more likely to experience migraine headaches:  Age. Risk increases with age.  Family history of migraine headaches.  Being Caucasian.  Depression and anxiety.  Obesity.  Being a woman.  Having a hole in the heart (patent foramen ovale) or other heart problems.  What are the signs or symptoms? The main symptom of this condition is pulsating or throbbing pain. Pain may:  Happen in any area of the head, such as on one side or both sides.  Interfere with daily activities.  Get worse with physical activity.  Get worse with exposure to bright lights or loud noises.  Other symptoms may include:  Nausea.  Vomiting.  Dizziness.  General sensitivity to bright lights, loud noises, or smells.  Before you get a migraine, you may get warning signs that a migraine is developing (aura). An aura may include:  Seeing flashing lights or having blind spots.  Seeing bright spots, halos, or zigzag lines.  Having tunnel vision or blurred vision.  Having numbness or a tingling feeling.  Having trouble talking.  Having muscle weakness.  How is this  diagnosed? A migraine headache can be diagnosed based on:  Your symptoms.  A physical exam.  Tests, such as CT scan or MRI of the head. These imaging tests can help rule out other causes of headaches.  Taking fluid from the spine (lumbar puncture) and analyzing it (cerebrospinal fluid analysis, or CSF analysis).  How is this treated? A migraine headache is usually treated with medicines that:  Relieve pain.  Relieve nausea.  Prevent migraines from coming back.  Treatment may also include:  Acupuncture.  Lifestyle changes like avoiding foods that trigger migraines.  Follow these instructions at home: Medicines  Take over-the-counter and prescription medicines only as told by your health care provider.  Do not drive or use heavy machinery while taking prescription pain medicine.  To prevent or treat constipation while you are taking prescription pain medicine, your health care provider may recommend that you: ? Drink enough fluid to keep your urine clear or pale yellow. ? Take over-the-counter or prescription medicines. ? Eat foods that are high in fiber, such as fresh fruits and vegetables, whole grains, and beans. ? Limit foods that are high in fat and processed sugars, such as fried and sweet foods. Lifestyle  Avoid alcohol use.  Do not use any products that contain nicotine or tobacco, such as cigarettes and e-cigarettes. If you need help quitting, ask your health care provider.  Get at least 8 hours of sleep every night.  Limit your stress. General instructions   Keep a journal to find out what may trigger your migraine headaches. For example, write down: ? What you eat and   drink. ? How much sleep you get. ? Any change to your diet or medicines.  If you have a migraine: ? Avoid things that make your symptoms worse, such as bright lights. ? It may help to lie down in a dark, quiet room. ? Do not drive or use heavy machinery. ? Ask your health care provider  what activities are safe for you while you are experiencing symptoms.  Keep all follow-up visits as told by your health care provider. This is important. Contact a health care provider if:  You develop symptoms that are different or more severe than your usual migraine symptoms. Get help right away if:  Your migraine becomes severe.  You have a fever.  You have a stiff neck.  You have vision loss.  Your muscles feel weak or like you cannot control them.  You start to lose your balance often.  You develop trouble walking.  You faint. This information is not intended to replace advice given to you by your health care provider. Make sure you discuss any questions you have with your health care provider. Document Released: 05/22/2005 Document Revised: 12/10/2015 Document Reviewed: 11/08/2015 Elsevier Interactive Patient Education  2017 [employer]   

## 2017-02-27 NOTE — Progress Notes (Signed)
Patient ID: Morgan Long, female    DOB: May 14, 1971  Age: 46 y.o. MRN: [id number]    Subjective:  Subjective  HPI Morgan Long presents for f/u migraines-- she has an appointment with neuro soon.   She also wanted to discuss weight loss  Review of Systems  Constitutional: Negative for chills and fever.  HENT: Negative for congestion and hearing loss.   Eyes: Negative for discharge.  Respiratory: Negative for cough and shortness of breath.   Cardiovascular: Negative for chest pain, palpitations and leg swelling.  Gastrointestinal: Negative for abdominal pain, blood in stool, constipation, diarrhea, nausea and vomiting.  Genitourinary: Negative for dysuria, frequency, hematuria and urgency.  Musculoskeletal: Negative for back pain and myalgias.  Skin: Negative for rash.  Allergic/Immunologic: Negative for environmental allergies.  Neurological: Negative for dizziness, weakness and headaches.  Hematological: Does not bruise/bleed easily.  Psychiatric/Behavioral: Negative for suicidal ideas. The patient is not nervous/anxious.     History Past Medical History:  Diagnosis Date  . Allergic rhinitis   . Chronic migraine    Dr. Catalina Gravel  . Dry eye syndrome of both lacrimal glands   . Endometriosis   . Gastroparesis   . GERD (gastroesophageal reflux disease)   . Headache(784.0)    occasional, dx w/ Migraines before, Topamax helps  . Localized edema   . Muscle weakness (generalized)   . Nuclear cataract of both eyes    Mild  . Pain in right ankle and joints of right foot   . Sinusitis   . TMJ pain dysfunction syndrome    occasional    She has a past surgical history that includes fallopian tube removed; Breast enhancement surgery (2006); endrometroisis; Augmentation mammaplasty (Bilateral, 2006); wisdoim teeth extraction; Colonoscopy; and Upper gastrointestinal endoscopy.   Her family history includes Asthma in her unknown relative; Breast cancer in her maternal aunt, maternal  aunt, maternal aunt, and maternal aunt; Cancer - Other (age of onset: 36) in her mother; Colon cancer in her paternal grandfather; Colon cancer (age of onset: 45) in her maternal uncle; Diabetes in her unknown relative; Heart disease in her father; Hypertension in her maternal aunt; Pancreatic cancer in her maternal aunt; Throat cancer in her maternal uncle and mother.She reports that she has never smoked. She has never used smokeless tobacco. She reports that she drinks alcohol. She reports that she does not use drugs.  Current Outpatient Prescriptions on File Prior to Visit  Medication Sig Dispense Refill  . albuterol (PROAIR HFA) 108 (90 Base) MCG/ACT inhaler INHALE TWO PUFFS EVERY 4-6 HOURS IF NEEDED FOR COUGH WHEEZE. 1 Inhaler 1  . AMBULATORY NON FORMULARY MEDICATION Medication Name: Domperidone 10 mg tablets Take one po before meals and at bedtime. May increase to 20 mg before meals when needed (max 7 tablets daily) 630 capsule 1  . beclomethasone (QVAR) 80 MCG/ACT inhaler INHALE TWO PUFFS TWICE DAILY TO PREVENT COUGH OR WHEEZE. RINSE MOUTH AFTER USE. USE WITH SPACER. 1 Inhaler 0  . cetirizine (ZYRTEC) 10 MG tablet Take 10 mg by mouth daily.    . clindamycin (CLEOCIN T) 1 % external solution Apply 1 application topically daily.    . DRYSOL 20 % external solution Reported on 06/03/2015    . ESTROGEL 0.75 MG/1.25 GM (0.06%) topical gel Apply 1 application topically See admin instructions.  0  . etonogestrel-ethinyl estradiol (NUVARING) 0.12-0.015 MG/24HR vaginal ring Place 1 each vaginally every 28 (twenty-eight) days. Insert vaginally and leave in place for 3 consecutive weeks, then remove for  1 week.     . Fluocinolone Acetonide 0.01 % OIL Place 1 drop into both ears as needed.  1  . FLUOCINOLONE ACETONIDE SCALP 0.01 % OIL Reported on 10/26/2015    . fluocinonide ointment (LIDEX) 1.51 % Apply 1 application topically as needed.  3  . ibuprofen (ADVIL,MOTRIN) 800 MG tablet Take 800 mg by mouth  every 6 (six) hours as needed. for pain  3  . LINZESS 72 MCG capsule TAKE 1 CAPSULE (72 MCG TOTAL) BY MOUTH DAILY BEFORE BREAKFAST. 30 capsule 8  . Multiple Vitamin (MULTIVITAMIN) LIQD Take 5 mLs by mouth daily.    . ondansetron (ZOFRAN) 4 MG tablet Take 1 every 6 hours as needed for nausea. 50 tablet 0  . ondansetron (ZOFRAN) 4 MG tablet Take 1 tablet (4 mg total) by mouth as needed for nausea or vomiting (may take 8 mg before each half of prep). 4 tablet 0  . phentermine 37.5 MG capsule Take 37.5 mg by mouth every morning.    Marland Kitchen QVAR 80 MCG/ACT inhaler INHALE TWO PUFFS TWICE DAILY TO PREVENT COUGH OR WHEEZE. RINSE MOUTH AFTER USE. USE WITH SPACER. 8 g 0  . ranitidine (ZANTAC) 150 MG tablet TAKE 1 TABLET (150 MG TOTAL) BY MOUTH 2 (TWO) TIMES DAILY. 60 tablet 1  . RESTASIS MULTIDOSE 0.05 % ophthalmic emulsion Place 1 drop into both eyes as needed.  3  . Vitamin D, Ergocalciferol, (DRISDOL) 50000 units CAPS capsule Take 50,000 Units by mouth every 7 (seven) days.     Current Facility-Administered Medications on File Prior to Visit  Medication Dose Route Frequency Provider Last Rate Last Dose  . 0.9 %  sodium chloride infusion  500 mL Intravenous Continuous Pyrtle, Lajuan Lines, MD         Objective:  Objective  Physical Exam  Constitutional: She is oriented to person, place, and time. She appears well-developed and well-nourished.  HENT:  Head: Normocephalic and atraumatic.  Eyes: Conjunctivae and EOM are normal.  Neck: Normal range of motion. Neck supple. No JVD present. Carotid bruit is not present. No thyromegaly present.  Cardiovascular: Normal rate, regular rhythm and normal heart sounds.   No murmur heard. Pulmonary/Chest: Effort normal and breath sounds normal. No respiratory distress. She has no wheezes. She has no rales. She exhibits no tenderness.  Musculoskeletal: She exhibits no edema.  Neurological: She is alert and oriented to person, place, and time.  Psychiatric: She has a normal  mood and affect.  Nursing note and vitals reviewed.  BP 106/74   Wt 209 lb (94.8 kg)   BMI 37.02 kg/m  Wt Readings from Last 3 Encounters:  02/27/17 209 lb (94.8 kg)  02/08/17 203 lb (92.1 kg)  01/26/17 207 lb (93.9 kg)     Lab Results  Component Value Date   WBC 10.5 02/27/2017   HGB 13.0 02/27/2017   HCT 40.5 02/27/2017   PLT 374.0 02/27/2017   GLUCOSE 89 02/27/2017   CHOL 199 05/14/2014   TRIG 61.0 05/14/2014   HDL 75.40 05/14/2014   LDLCALC 111 (H) 05/14/2014   ALT 15 02/27/2017   AST 14 02/27/2017   NA 137 02/27/2017   K 4.1 02/27/2017   CL 104 02/27/2017   CREATININE 0.91 02/27/2017   BUN 12 02/27/2017   CO2 26 02/27/2017   TSH 1.26 05/14/2014   HGBA1C 5.6 06/09/2015    Mm Screening Breast W/implant Tomo Bilateral  Result Date: 12/01/2016 CLINICAL DATA:  Screening. EXAM: 2D DIGITAL SCREENING BILATERAL MAMMOGRAM  WITH IMPLANTS, CAD AND ADJUNCT TOMO The patient has retropectoral implants. Standard and implant displaced views were performed. COMPARISON:  Previous exam(s). ACR Breast Density Category d: The breast tissue is extremely dense, which lowers the sensitivity of mammography. FINDINGS: There are no findings suspicious for malignancy. Images were processed with CAD. IMPRESSION: No mammographic evidence of malignancy. A result letter of this screening mammogram will be mailed directly to the patient. RECOMMENDATION: Screening mammogram in one year. (Code:SM-B-01Y) BI-RADS CATEGORY  1:  Negative. Electronically Signed   By: Lajean Manes M.D.   On: 12/01/2016 16:41     Assessment & Plan:  Plan  I am having Ms. Mccleod maintain her etonogestrel-ethinyl estradiol, multivitamin, DRYSOL, Fluocinolone Acetonide Scalp, fluocinonide ointment, ESTROGEL, albuterol, ondansetron, LINZESS, AMBULATORY NON FORMULARY MEDICATION, ranitidine, clindamycin, Fluocinolone Acetonide, RESTASIS MULTIDOSE, ibuprofen, cetirizine, Vitamin D (Ergocalciferol), phentermine, ondansetron, QVAR,  and beclomethasone. We will continue to administer sodium chloride.  No orders of the defined types were placed in this encounter.   Problem List Items Addressed This Visit      Unprioritized   Chronic migraine without aura without status migrainosus, not intractable    Per neuro      Leukocytosis - Primary    Recheck labs today      Relevant Orders   CBC with Differential/Platelet (Completed)   Comprehensive metabolic panel (Completed)   Obesity (BMI 35.0-39.9 without comorbidity)    Discussed exercise and diet Healthy weight and wellness        Other Visit Diagnoses    Flu vaccine need       Relevant Orders   Flu Vaccine QUAD 6+ mos PF IM (Fluarix Quad PF) (Completed)      Follow-up: Return if symptoms worsen or fail to improve, for annual exam, fasting.  [name], DO

## 2017-02-28 DIAGNOSIS — R609 Edema, unspecified: Secondary | ICD-10-CM | POA: Diagnosis not present

## 2017-02-28 DIAGNOSIS — M19072 Primary osteoarthritis, left ankle and foot: Secondary | ICD-10-CM | POA: Diagnosis not present

## 2017-02-28 DIAGNOSIS — M659 Synovitis and tenosynovitis, unspecified: Secondary | ICD-10-CM | POA: Diagnosis not present

## 2017-02-28 DIAGNOSIS — M25572 Pain in left ankle and joints of left foot: Secondary | ICD-10-CM | POA: Diagnosis not present

## 2017-02-28 LAB — COMPREHENSIVE METABOLIC PANEL
ALT: 15 U/L (ref 0–35)
AST: 14 U/L (ref 0–37)
Albumin: 3.7 g/dL (ref 3.5–5.2)
Alkaline Phosphatase: 57 U/L (ref 39–117)
BUN: 12 mg/dL (ref 6–23)
CALCIUM: 8.7 mg/dL (ref 8.4–10.5)
CHLORIDE: 104 meq/L (ref 96–112)
CO2: 26 meq/L (ref 19–32)
CREATININE: 0.91 mg/dL (ref 0.40–1.20)
GFR: 84.74 mL/min (ref >60.00)
Glucose, Bld: 89 mg/dL (ref 70–99)
Potassium: 4.1 mEq/L (ref 3.5–5.1)
Sodium: 137 mEq/L (ref 135–145)
Total Bilirubin: 0.2 mg/dL (ref 0.2–1.2)
Total Protein: 6.9 g/dL (ref 6.0–8.3)

## 2017-02-28 LAB — CBC WITH DIFFERENTIAL/PLATELET
BASOS PCT: 1.2 % (ref 0.0–3.0)
Basophils Absolute: 0.1 10*3/uL (ref 0.0–0.1)
EOS ABS: 0.1 10*3/uL (ref 0.0–0.7)
Eosinophils Relative: 0.8 % (ref 0.0–5.0)
HEMATOCRIT: 40.5 % (ref 36.0–46.0)
Hemoglobin: 13 g/dL (ref 12.0–15.0)
LYMPHS PCT: 18.5 % (ref 12.0–46.0)
Lymphs Abs: 1.9 10*3/uL (ref 0.7–4.0)
MCHC: 32.1 g/dL (ref 30.0–36.0)
MCV: 91.8 fl (ref 78.0–100.0)
Monocytes Absolute: 0.7 10*3/uL (ref 0.1–1.0)
Monocytes Relative: 6.9 % (ref 3.0–12.0)
NEUTROS ABS: 7.6 10*3/uL (ref 1.4–7.7)
Neutrophils Relative %: 72.6 % (ref 43.0–77.0)
PLATELETS: 374 10*3/uL (ref 150.0–400.0)
RBC: 4.42 Mil/uL (ref 3.87–5.11)
RDW: 13.7 % (ref 11.5–15.5)
WBC: 10.5 10*3/uL (ref 4.0–10.5)

## 2017-03-01 ENCOUNTER — Encounter: Payer: Self-pay | Admitting: Family Medicine

## 2017-03-01 DIAGNOSIS — G43709 Chronic migraine without aura, not intractable, without status migrainosus: Secondary | ICD-10-CM | POA: Insufficient documentation

## 2017-03-01 DIAGNOSIS — E669 Obesity, unspecified: Secondary | ICD-10-CM | POA: Insufficient documentation

## 2017-03-01 DIAGNOSIS — D72829 Elevated white blood cell count, unspecified: Secondary | ICD-10-CM | POA: Insufficient documentation

## 2017-03-01 NOTE — Assessment & Plan Note (Signed)
Per neuro 

## 2017-03-01 NOTE — Assessment & Plan Note (Signed)
Discussed exercise and diet Healthy weight and wellness

## 2017-03-01 NOTE — Assessment & Plan Note (Signed)
Recheck labs today. 

## 2017-03-03 ENCOUNTER — Encounter: Payer: Self-pay | Admitting: Family Medicine

## 2017-03-12 ENCOUNTER — Encounter: Payer: Self-pay | Admitting: Family Medicine

## 2017-03-12 NOTE — Telephone Encounter (Signed)
She needs the info for [facility] and the shot is saxenda

## 2017-03-14 ENCOUNTER — Encounter: Payer: Self-pay | Admitting: Family Medicine

## 2017-03-16 ENCOUNTER — Other Ambulatory Visit: Payer: Self-pay

## 2017-03-16 ENCOUNTER — Encounter: Payer: Self-pay | Admitting: Family Medicine

## 2017-03-16 ENCOUNTER — Ambulatory Visit (INDEPENDENT_AMBULATORY_CARE_PROVIDER_SITE_OTHER): Payer: BLUE CROSS/BLUE SHIELD | Admitting: Family Medicine

## 2017-03-16 VITALS — BP 110/68 | HR 97 | Temp 98.9°F | Resp 16 | Ht 63.0 in | Wt 209.2 lb

## 2017-03-16 DIAGNOSIS — R059 Cough, unspecified: Secondary | ICD-10-CM

## 2017-03-16 DIAGNOSIS — R05 Cough: Secondary | ICD-10-CM

## 2017-03-16 DIAGNOSIS — J40 Bronchitis, not specified as acute or chronic: Secondary | ICD-10-CM | POA: Diagnosis not present

## 2017-03-16 LAB — POCT INFLUENZA A/B
INFLUENZA A, POC: NEGATIVE
Influenza B, POC: NEGATIVE

## 2017-03-16 MED ORDER — AZITHROMYCIN 250 MG PO TABS: ORAL_TABLET | ORAL | 0 refills | Status: DC

## 2017-03-16 MED ORDER — PROMETHAZINE-DM 6.25-15 MG/5ML PO SYRP: 5.0000 mL | ORAL_SOLUTION | Freq: Four times a day (QID) | ORAL | 0 refills | Status: DC | PRN

## 2017-03-16 MED ORDER — ALBUTEROL SULFATE HFA 108 (90 BASE) MCG/ACT IN AERS: INHALATION_SPRAY | RESPIRATORY_TRACT | 1 refills | Status: DC

## 2017-03-16 MED ORDER — BECLOMETHASONE DIPROPIONATE 80 MCG/ACT IN AERS: INHALATION_SPRAY | RESPIRATORY_TRACT | 0 refills | Status: DC

## 2017-03-16 NOTE — Progress Notes (Signed)
Patient ID: Morgan Long, female    DOB: 1970/09/16  Age: 46 y.o. MRN: [id number]    Subjective:  Subjective  HPI [name] presents for fever 100 today.   3 days congestion and sob and wheezing .  Pt did not take anything otc except ibuprofen.  + prod cough Sputum is thick.  No body aches.    Review of Systems  Constitutional: Positive for chills and fever.  HENT: Positive for congestion. Negative for postnasal drip, rhinorrhea and sinus pressure.   Respiratory: Positive for cough, chest tightness, shortness of breath and wheezing.   Cardiovascular: Negative for chest pain, palpitations and leg swelling.  Allergic/Immunologic: Negative for environmental allergies.    History Past Medical History:  Diagnosis Date  . Allergic rhinitis   . Chronic migraine    Dr. Catalina Gravel  . Dry eye syndrome of both lacrimal glands   . Endometriosis   . Gastroparesis   . GERD (gastroesophageal reflux disease)   . Headache(784.0)    occasional, dx w/ Migraines before, Topamax helps  . Localized edema   . Muscle weakness (generalized)   . Nuclear cataract of both eyes    Mild  . Pain in right ankle and joints of right foot   . Sinusitis   . TMJ pain dysfunction syndrome    occasional    She has a past surgical history that includes fallopian tube removed; Breast enhancement surgery (2006); endrometroisis; Augmentation mammaplasty (Bilateral, 2006); wisdoim teeth extraction; Colonoscopy; and Upper gastrointestinal endoscopy.   Her family history includes Asthma in her unknown relative; Breast cancer in her maternal aunt, maternal aunt, maternal aunt, and maternal aunt; Cancer - Other (age of onset: 69) in her mother; Colon cancer in her paternal grandfather; Colon cancer (age of onset: 46) in her maternal uncle; Diabetes in her unknown relative; Heart disease in her father; Hypertension in her maternal aunt; Pancreatic cancer in her maternal aunt; Throat cancer in her maternal uncle and  mother.She reports that she has never smoked. She has never used smokeless tobacco. She reports that she drinks alcohol. She reports that she does not use drugs.  Current Outpatient Prescriptions on File Prior to Visit  Medication Sig Dispense Refill  . AMBULATORY NON FORMULARY MEDICATION Medication Name: Domperidone 10 mg tablets Take one po before meals and at bedtime. May increase to 20 mg before meals when needed (max 7 tablets daily) 630 capsule 1  . cetirizine (ZYRTEC) 10 MG tablet Take 10 mg by mouth daily.    . clindamycin (CLEOCIN T) 1 % external solution Apply 1 application topically daily.    Marland Kitchen ESTROGEL 0.75 MG/1.25 GM (0.06%) topical gel Apply 1 application topically See admin instructions.  0  . etonogestrel-ethinyl estradiol (NUVARING) 0.12-0.015 MG/24HR vaginal ring Place 1 each vaginally every 28 (twenty-eight) days. Insert vaginally and leave in place for 3 consecutive weeks, then remove for 1 week.     . Fluocinolone Acetonide 0.01 % OIL Place 1 drop into both ears as needed.  1  . fluocinonide ointment (LIDEX) 5.46 % Apply 1 application topically as needed.  3  . ibuprofen (ADVIL,MOTRIN) 800 MG tablet Take 800 mg by mouth every 6 (six) hours as needed. for pain  3  . LINZESS 72 MCG capsule TAKE 1 CAPSULE (72 MCG TOTAL) BY MOUTH DAILY BEFORE BREAKFAST. 30 capsule 8  . Multiple Vitamin (MULTIVITAMIN) LIQD Take 5 mLs by mouth daily.    . ondansetron (ZOFRAN) 4 MG tablet Take 1 tablet (4 mg  total) by mouth as needed for nausea or vomiting (may take 8 mg before each half of prep). 4 tablet 0  . phentermine 37.5 MG capsule Take 37.5 mg by mouth every morning.    . ranitidine (ZANTAC) 150 MG tablet TAKE 1 TABLET (150 MG TOTAL) BY MOUTH 2 (TWO) TIMES DAILY. 60 tablet 1  . RESTASIS MULTIDOSE 0.05 % ophthalmic emulsion Place 1 drop into both eyes as needed.  3  . Vitamin D, Ergocalciferol, (DRISDOL) 50000 units CAPS capsule Take 50,000 Units by mouth every 7 (seven) days.     Current  Facility-Administered Medications on File Prior to Visit  Medication Dose Route Frequency Provider Last Rate Last Dose  . 0.9 %  sodium chloride infusion  500 mL Intravenous Continuous Pyrtle, Lajuan Lines, MD         Objective:  Objective  Physical Exam  Constitutional: She is oriented to person, place, and time. She appears well-developed and well-nourished.  HENT:  Right Ear: External ear normal.  Left Ear: External ear normal.  + PND + errythema  Eyes: Conjunctivae are normal. Right eye exhibits no discharge. Left eye exhibits no discharge.  Cardiovascular: Normal rate, regular rhythm and normal heart sounds.   No murmur heard. Pulmonary/Chest: Effort normal. No respiratory distress. She has decreased breath sounds in the right lower field and the left lower field. She has no wheezes. She has no rales. She exhibits no tenderness.  Dec breath sounds Cleared after duoneb    Musculoskeletal: She exhibits no edema.  Lymphadenopathy:    She has cervical adenopathy.  Neurological: She is alert and oriented to person, place, and time.  Nursing note and vitals reviewed.  BP 110/68   Pulse 97   Temp 98.9 F (37.2 C) (Oral)   Resp 16   Ht 5\' 3"  (1.6 m)   Wt 209 lb 3.2 oz (94.9 kg)   SpO2 99%   BMI 37.06 kg/m  Wt Readings from Last 3 Encounters:  03/16/17 209 lb 3.2 oz (94.9 kg)  02/27/17 209 lb (94.8 kg)  02/08/17 203 lb (92.1 kg)     Lab Results  Component Value Date   WBC 10.5 02/27/2017   HGB 13.0 02/27/2017   HCT 40.5 02/27/2017   PLT 374.0 02/27/2017   GLUCOSE 89 02/27/2017   CHOL 199 05/14/2014   TRIG 61.0 05/14/2014   HDL 75.40 05/14/2014   LDLCALC 111 (H) 05/14/2014   ALT 15 02/27/2017   AST 14 02/27/2017   NA 137 02/27/2017   K 4.1 02/27/2017   CL 104 02/27/2017   CREATININE 0.91 02/27/2017   BUN 12 02/27/2017   CO2 26 02/27/2017   TSH 1.26 05/14/2014   HGBA1C 5.6 06/09/2015    Mm Screening Breast W/implant Tomo Bilateral  Result Date:  12/01/2016 CLINICAL DATA:  Screening. EXAM: 2D DIGITAL SCREENING BILATERAL MAMMOGRAM WITH IMPLANTS, CAD AND ADJUNCT TOMO The patient has retropectoral implants. Standard and implant displaced views were performed. COMPARISON:  Previous exam(s). ACR Breast Density Category d: The breast tissue is extremely dense, which lowers the sensitivity of mammography. FINDINGS: There are no findings suspicious for malignancy. Images were processed with CAD. IMPRESSION: No mammographic evidence of malignancy. A result letter of this screening mammogram will be mailed directly to the patient. RECOMMENDATION: Screening mammogram in one year. (Code:SM-B-01Y) BI-RADS CATEGORY  1:  Negative. Electronically Signed   By: Lajean Manes M.D.   On: 12/01/2016 16:41     Assessment & Plan:  Plan  I have  discontinued Ms. Bourdon's DRYSOL, Fluocinolone Acetonide Scalp, and QVAR. I am also having her start on azithromycin and promethazine-dextromethorphan. Additionally, I am having her maintain her etonogestrel-ethinyl estradiol, multivitamin, fluocinonide ointment, ESTROGEL, LINZESS, AMBULATORY NON FORMULARY MEDICATION, ranitidine, clindamycin, Fluocinolone Acetonide, RESTASIS MULTIDOSE, ibuprofen, cetirizine, Vitamin D (Ergocalciferol), phentermine, ondansetron, beclomethasone, and albuterol. We will continue to administer sodium chloride.  Meds ordered this encounter  Medications  . beclomethasone (QVAR) 80 MCG/ACT inhaler    Sig: INHALE TWO PUFFS TWICE DAILY TO PREVENT COUGH OR WHEEZE. RINSE MOUTH AFTER USE. USE WITH SPACER.    Dispense:  1 Inhaler    Refill:  0    Patient needs an OV, no refills with be given  . albuterol (PROAIR HFA) 108 (90 Base) MCG/ACT inhaler    Sig: INHALE TWO PUFFS EVERY 4-6 HOURS IF NEEDED FOR COUGH WHEEZE.    Dispense:  1 Inhaler    Refill:  1  . azithromycin (ZITHROMAX Z-PAK) 250 MG tablet    Sig: As directed    Dispense:  6 each    Refill:  0  . promethazine-dextromethorphan  (PROMETHAZINE-DM) 6.25-15 MG/5ML syrup    Sig: Take 5 mLs by mouth 4 (four) times daily as needed.    Dispense:  118 mL    Refill:  0    Problem List Items Addressed This Visit    None    Visit Diagnoses    Cough    -  Primary   Relevant Medications   promethazine-dextromethorphan (PROMETHAZINE-DM) 6.25-15 MG/5ML syrup   Other Relevant Orders   POCT Influenza A/B (Completed)   Bronchitis       Relevant Medications   beclomethasone (QVAR) 80 MCG/ACT inhaler   albuterol (PROAIR HFA) 108 (90 Base) MCG/ACT inhaler   azithromycin (ZITHROMAX Z-PAK) 250 MG tablet      Follow-up: Return if symptoms worsen or fail to improve.  [name], DO

## 2017-03-16 NOTE — Patient Instructions (Signed)

## 2017-03-19 ENCOUNTER — Encounter: Payer: Self-pay | Admitting: Family Medicine

## 2017-03-19 NOTE — Telephone Encounter (Signed)
Can she come for cxr ?  Either here or [facility] --- which ever is more convenient for her ?   --- ov tomorrow

## 2017-03-20 ENCOUNTER — Ambulatory Visit (HOSPITAL_BASED_OUTPATIENT_CLINIC_OR_DEPARTMENT_OTHER)
Admission: RE | Admit: 2017-03-20 | Discharge: 2017-03-20 | Disposition: A | Discharge status: Home / Self Care | Payer: BLUE CROSS/BLUE SHIELD | Source: Ambulatory Visit | Attending: Family Medicine | Admitting: Family Medicine

## 2017-03-20 ENCOUNTER — Encounter: Payer: Self-pay | Admitting: Family Medicine

## 2017-03-20 DIAGNOSIS — R05 Cough: Secondary | ICD-10-CM

## 2017-03-20 DIAGNOSIS — R059 Cough, unspecified: Secondary | ICD-10-CM

## 2017-03-20 DIAGNOSIS — R0602 Shortness of breath: Secondary | ICD-10-CM | POA: Diagnosis not present

## 2017-03-29 ENCOUNTER — Other Ambulatory Visit: Payer: Self-pay | Admitting: Family Medicine

## 2017-03-29 DIAGNOSIS — R05 Cough: Secondary | ICD-10-CM

## 2017-03-29 DIAGNOSIS — R059 Cough, unspecified: Secondary | ICD-10-CM

## 2017-03-30 ENCOUNTER — Encounter: Payer: Self-pay | Admitting: Family Medicine

## 2017-03-30 ENCOUNTER — Other Ambulatory Visit (INDEPENDENT_AMBULATORY_CARE_PROVIDER_SITE_OTHER): Payer: BLUE CROSS/BLUE SHIELD

## 2017-03-30 DIAGNOSIS — R05 Cough: Secondary | ICD-10-CM

## 2017-03-30 DIAGNOSIS — D72829 Elevated white blood cell count, unspecified: Secondary | ICD-10-CM

## 2017-03-30 DIAGNOSIS — R059 Cough, unspecified: Secondary | ICD-10-CM

## 2017-03-30 MED ORDER — LEVOFLOXACIN 500 MG PO TABS: 500.0000 mg | ORAL_TABLET | Freq: Every day | ORAL | 0 refills | Status: DC

## 2017-03-30 MED ORDER — PROMETHAZINE-DM 6.25-15 MG/5ML PO SYRP: 5.0000 mL | ORAL_SOLUTION | Freq: Four times a day (QID) | ORAL | 0 refills | Status: DC | PRN

## 2017-03-30 NOTE — Telephone Encounter (Signed)
levaquin 500 mg 1 po qd, #10 ---- Ov if no better

## 2017-03-30 NOTE — Addendum Note (Signed)
Addended by: Caffie Pinto on: 03/30/2017 03:11 PM   Modules accepted: Orders

## 2017-03-30 NOTE — Addendum Note (Signed)
Addended by: Caffie Pinto on: 03/30/2017 03:12 PM   Modules accepted: Orders

## 2017-03-30 NOTE — Addendum Note (Signed)
Addended byDamita Dunnings D on: 03/30/2017 02:26 PM   Modules accepted: Orders

## 2017-03-31 LAB — URINALYSIS
BILIRUBIN URINE: NEGATIVE
Glucose, UA: NEGATIVE
KETONES UR: NEGATIVE
NITRITE: NEGATIVE
Protein, ur: NEGATIVE
Specific Gravity, Urine: 1.021 (ref 1.001–1.03)

## 2017-03-31 LAB — CBC WITH DIFFERENTIAL/PLATELET
Basophils Absolute: 49 cells/uL (ref 0–200)
Basophils Relative: 0.5 %
EOS PCT: 1 %
Eosinophils Absolute: 97 cells/uL (ref 15–500)
HCT: 39.4 % (ref 35.0–45.0)
HEMOGLOBIN: 12.8 g/dL (ref 11.7–15.5)
Lymphs Abs: 2483 cells/uL (ref 850–3900)
MCH: 28.6 pg (ref 27.0–33.0)
MCHC: 32.5 g/dL (ref 32.0–36.0)
MCV: 87.9 fL (ref 80.0–100.0)
MPV: 10 fL (ref 7.5–12.5)
Monocytes Relative: 10.9 %
NEUTROS ABS: 6014 {cells}/uL (ref 1500–7800)
Neutrophils Relative %: 62 %
Platelets: 416 10*3/uL — ABNORMAL HIGH (ref 140–400)
RBC: 4.48 10*6/uL (ref 3.80–5.10)
RDW: 12.5 % (ref 11.0–15.0)
Total Lymphocyte: 25.6 %
WBC mixed population: 1057 cells/uL — ABNORMAL HIGH (ref 200–950)
WBC: 9.7 10*3/uL (ref 3.8–10.8)

## 2017-04-02 ENCOUNTER — Other Ambulatory Visit: Payer: Self-pay | Admitting: Family Medicine

## 2017-04-02 DIAGNOSIS — R059 Cough, unspecified: Secondary | ICD-10-CM

## 2017-04-02 DIAGNOSIS — R05 Cough: Secondary | ICD-10-CM

## 2017-04-05 ENCOUNTER — Other Ambulatory Visit: Payer: Self-pay

## 2017-04-05 NOTE — Telephone Encounter (Signed)
Called Pt several times with no response in regards to refill request for promethazine. Refill denied and I have been unable to leave a VM for Pt.

## 2017-04-08 ENCOUNTER — Other Ambulatory Visit: Payer: Self-pay | Admitting: Allergy and Immunology

## 2017-04-08 DIAGNOSIS — J45901 Unspecified asthma with (acute) exacerbation: Secondary | ICD-10-CM

## 2017-04-10 DIAGNOSIS — L918 Other hypertrophic disorders of the skin: Secondary | ICD-10-CM | POA: Diagnosis not present

## 2017-04-12 ENCOUNTER — Other Ambulatory Visit: Payer: Self-pay

## 2017-04-12 MED ORDER — BECLOMETHASONE DIPROP HFA 80 MCG/ACT IN AERB: 2.0000 | INHALATION_SPRAY | Freq: Two times a day (BID) | RESPIRATORY_TRACT | 5 refills | Status: DC

## 2017-04-16 ENCOUNTER — Encounter: Payer: Self-pay | Admitting: Allergy and Immunology

## 2017-04-16 ENCOUNTER — Ambulatory Visit (INDEPENDENT_AMBULATORY_CARE_PROVIDER_SITE_OTHER): Payer: BLUE CROSS/BLUE SHIELD | Admitting: Allergy and Immunology

## 2017-04-16 VITALS — BP 130/80 | HR 84 | Resp 16 | Ht 63.0 in | Wt 211.2 lb

## 2017-04-16 DIAGNOSIS — J3089 Other allergic rhinitis: Secondary | ICD-10-CM

## 2017-04-16 DIAGNOSIS — J45901 Unspecified asthma with (acute) exacerbation: Secondary | ICD-10-CM | POA: Diagnosis not present

## 2017-04-16 DIAGNOSIS — J40 Bronchitis, not specified as acute or chronic: Secondary | ICD-10-CM | POA: Diagnosis not present

## 2017-04-16 MED ORDER — AZELASTINE HCL 0.1 % NA SOLN: 1.0000 | Freq: Two times a day (BID) | NASAL | 5 refills | Status: DC

## 2017-04-16 MED ORDER — RANITIDINE HCL 150 MG PO TABS: 150.0000 mg | ORAL_TABLET | Freq: Two times a day (BID) | ORAL | 5 refills | Status: DC

## 2017-04-16 MED ORDER — CETIRIZINE HCL 10 MG PO TABS: 10.0000 mg | ORAL_TABLET | Freq: Every day | ORAL | 5 refills | Status: DC

## 2017-04-16 MED ORDER — ALBUTEROL SULFATE HFA 108 (90 BASE) MCG/ACT IN AERS: INHALATION_SPRAY | RESPIRATORY_TRACT | 1 refills | Status: DC

## 2017-04-16 MED ORDER — BUDESONIDE-FORMOTEROL FUMARATE 160-4.5 MCG/ACT IN AERO: 2.0000 | INHALATION_SPRAY | Freq: Two times a day (BID) | RESPIRATORY_TRACT | 5 refills | Status: DC

## 2017-04-16 NOTE — Progress Notes (Signed)
Follow-up Note  RE: Morgan Long MRN: [id number] DOB: May 27, 1971 Date of Office Visit: 04/16/2017  Primary care provider: Carollee Herter, Alferd Apa, DO Referring provider: Carollee Herter, Alferd Apa, *  History of present illness: Morgan Long is a 46 y.o. female with persistent asthma and allergic rhinitis presenting today for a sick visit.  She was last seen in this clinic in January 2017.  She reports that 3 weeks ago she began to experience a rattling in her chest, chest tightness, wheezing, and coughing.  She was given a Z-Pak at her primary care physician's office without perceived benefit.  She went back to see her primary care physician and was given Augmentin with some benefit, however she is still experiencing chest tightness and wheezing throughout the day as well as nocturnal awakenings due to lower respiratory symptoms.  She does not complain of fevers, chills, or discolored mucus production.  She reports that prior to this exacerbation she was experiencing dyspnea with mild/moderate exertion, such as climbing one flight of stairs despite compliance with Qvar 80 g Redihaler, 2 inhalations twice daily.  She reports that her nasal symptoms are controlled with nasal saline irrigation as needed.   Assessment and plan: Asthma with acute exacerbation A prescription has been provided for Symbicort (budesonide/formoterol) 160/4.5 g,  2 inhalations twice a day. To maximize pulmonary deposition, a spacer has been provided along with instructions for its proper administration with an HFA inhaler.  Continue albuterol HFA, 1-2 inhalations via spacer device every 4-6 hours as needed and 15 minutes prior to exercise.  If her symptoms persist or progress over the next couple days despite the treatment plan as outlined above, prednisone has been provided, 40 mg x3 days, 20 mg x1 day, 10 mg x1 day, then stop.  The patient has been asked to contact me if her symptoms persist or progress. Otherwise,  she may return for follow up in 4 months.  Allergic rhinitis  Continue appropriate allergen avoidance measures and cetirizine 10 mg daily as needed.  A refill prescription has been provided for azelastine nasal spray, 1-2 sprays per nostril twice daily if needed.    Continue nasal saline irrigation as needed and prior to medicated nasal sprays..   Meds ordered this encounter  Medications  . albuterol (PROAIR HFA) 108 (90 Base) MCG/ACT inhaler    Sig: INHALE TWO PUFFS EVERY 4-6 HOURS IF NEEDED FOR COUGH WHEEZE.    Dispense:  1 Inhaler    Refill:  1  . cetirizine (ZYRTEC) 10 MG tablet    Sig: Take 1 tablet (10 mg total) daily by mouth.    Dispense:  30 tablet    Refill:  5  . ranitidine (ZANTAC) 150 MG tablet    Sig: Take 1 tablet (150 mg total) 2 (two) times daily by mouth.    Dispense:  60 tablet    Refill:  5  . budesonide-formoterol (SYMBICORT) 160-4.5 MCG/ACT inhaler    Sig: Inhale 2 puffs 2 (two) times daily into the lungs.    Dispense:  1 Inhaler    Refill:  5  . azelastine (ASTELIN) 0.1 % nasal spray    Sig: Place 1-2 sprays 2 (two) times daily into both nostrils.    Dispense:  30 mL    Refill:  5    Diagnostics: Spirometry reveals an FVC of 2.20 L and an FEV1 of 1.91 L (84% predicted) with 80 mL (5%) postbronchodilator improvement.  Please see scanned spirometry results for details.  Physical examination: Blood pressure 130/80, pulse 84, resp. rate 16, height 5\' 3"  (1.6 m), weight 211 lb 3.2 oz (95.8 kg), SpO2 92 %.  General: Alert, interactive, in no acute distress. HEENT: TMs pearly gray, turbinates mildly edematous without discharge, post-pharynx mildly erythematous. Neck: Supple without lymphadenopathy. Lungs: Mildly decreased breath sounds bilaterally without wheezing, rhonchi or rales. CV: Normal S1, S2 without murmurs. Skin: Warm and dry, without lesions or rashes.  The following portions of the patient's history were reviewed and updated as  appropriate: allergies, current medications, past family history, past medical history, past social history, past surgical history and problem list.  Allergies as of 04/16/2017      Reactions   Acetaminophen Itching   Hydrocodone Nausea Only   Oxycodone Itching   Phosphate Itching   sick   Codeine Itching, Nausea Only      Medication List        Accurate as of 04/16/17 11:59 PM. Always use your most recent med list.          albuterol 108 (90 Base) MCG/ACT inhaler Commonly known as:  PROAIR HFA INHALE TWO PUFFS EVERY 4-6 HOURS IF NEEDED FOR COUGH WHEEZE.   AMBULATORY NON FORMULARY MEDICATION Medication Name: Domperidone 10 mg tablets Take one po before meals and at bedtime. May increase to 20 mg before meals when needed (max 7 tablets daily)   azelastine 0.1 % nasal spray Commonly known as:  ASTELIN Place 1-2 sprays 2 (two) times daily into both nostrils.   beclomethasone 80 MCG/ACT inhaler Commonly known as:  QVAR REDIHALER Inhale 2 puffs 2 (two) times daily into the lungs.   budesonide-formoterol 160-4.5 MCG/ACT inhaler Commonly known as:  SYMBICORT Inhale 2 puffs 2 (two) times daily into the lungs.   cetirizine 10 MG tablet Commonly known as:  ZYRTEC Take 1 tablet (10 mg total) daily by mouth.   ESTROGEL 0.75 MG/1.25 GM (0.06%) topical gel Generic drug:  Estradiol Apply 1 application topically See admin instructions.   etonogestrel-ethinyl estradiol 0.12-0.015 MG/24HR vaginal ring Commonly known as:  Xenia 1 each vaginally every 28 (twenty-eight) days. Insert vaginally and leave in place for 3 consecutive weeks, then remove for 1 week.   Fluocinolone Acetonide 0.01 % Oil Place 1 drop into both ears as needed.   ibuprofen 800 MG tablet Commonly known as:  ADVIL,MOTRIN Take 800 mg by mouth every 6 (six) hours as needed. for pain   levofloxacin 500 MG tablet Commonly known as:  LEVAQUIN Take 1 tablet (500 mg total) by mouth daily.   LINZESS 72  MCG capsule Generic drug:  linaclotide TAKE 1 CAPSULE (72 MCG TOTAL) BY MOUTH DAILY BEFORE BREAKFAST.   multivitamin Liqd Take 5 mLs by mouth daily.   ondansetron 4 MG tablet Commonly known as:  ZOFRAN Take 1 tablet (4 mg total) by mouth as needed for nausea or vomiting (may take 8 mg before each half of prep).   phentermine 37.5 MG capsule Take 37.5 mg by mouth every morning.   promethazine-dextromethorphan 6.25-15 MG/5ML syrup Commonly known as:  PROMETHAZINE-DM Take 5 mLs by mouth 4 (four) times daily as needed.   ranitidine 150 MG tablet Commonly known as:  ZANTAC Take 1 tablet (150 mg total) 2 (two) times daily by mouth.   RESTASIS MULTIDOSE 0.05 % ophthalmic emulsion Generic drug:  cycloSPORINE Place 1 drop into both eyes as needed.       Allergies  Allergen Reactions  . Acetaminophen Itching  . Hydrocodone Nausea Only  .  Oxycodone Itching  . Phosphate Itching    sick  . Codeine Itching and Nausea Only   Review of systems: Review of systems negative except as noted in HPI / PMHx or noted below: Constitutional: Negative.  HENT: Negative.   Eyes: Negative.  Respiratory: Negative.   Cardiovascular: Negative.  Gastrointestinal: Negative.  Genitourinary: Negative.  Musculoskeletal: Negative.  Neurological: Negative.  Endo/Heme/Allergies: Negative.  Cutaneous: Negative.  Past Medical History:  Diagnosis Date  . Allergic rhinitis   . Chronic migraine    Dr. Catalina Gravel  . Dry eye syndrome of both lacrimal glands   . Endometriosis   . Gastroparesis   . GERD (gastroesophageal reflux disease)   . Headache(784.0)    occasional, dx w/ Migraines before, Topamax helps  . Localized edema   . Muscle weakness (generalized)   . Nuclear cataract of both eyes    Mild  . Pain in right ankle and joints of right foot   . Sinusitis   . TMJ pain dysfunction syndrome    occasional    Family History  Problem Relation Age of Onset  . Throat cancer Mother   . Cancer -  Other Mother 55       throat - died 32 months  . Heart disease Father   . Breast cancer Maternal Aunt        breast  . Colon cancer Maternal Uncle 52       died 38  . Breast cancer Maternal Aunt        breast  . Pancreatic cancer Maternal Aunt   . Diabetes Unknown        grandmother  . Hypertension Maternal Aunt        several family members  . Throat cancer Maternal Uncle   . Asthma Unknown        cousin, maternal  . Breast cancer Maternal Aunt        total of 5 aunts  . Breast cancer Maternal Aunt   . Colon cancer Paternal Grandfather   . Heart attack Neg Hx   . Rectal cancer Neg Hx   . Stomach cancer Neg Hx     Social History   Socioeconomic History  . Marital status: Single    Spouse name: Not on file  . Number of children: 0  . Years of education: BS  . Highest education level: Not on file  Social Needs  . Financial resource strain: Not on file  . Food insecurity - worry: Not on file  . Food insecurity - inability: Not on file  . Transportation needs - medical: Not on file  . Transportation needs - non-medical: Not on file  Occupational History  . Occupation: Paramedic, going to school    Employer: [employer]  . Occupation: student-- clinical psychology  Tobacco Use  . Smoking status: Never Smoker  . Smokeless tobacco: Never Used  Substance and Sexual Activity  . Alcohol use: Yes    Alcohol/week: 0.0 oz    Comment: socially - occasional   . Drug use: No  . Sexual activity: Yes    Birth control/protection: Inserts    Comment: nuvaring  Other Topics Concern  . Not on file  Social History Narrative   Household:sister and her 3 kids    Drinks occasional [employer]bucks drink        I appreciate the opportunity to take part in Larita's care. Please do not hesitate to contact me with questions.  Sincerely,   R.  Edgar Frisk, MD

## 2017-04-16 NOTE — Assessment & Plan Note (Signed)
   Continue appropriate allergen avoidance measures and cetirizine 10 mg daily as needed.  A refill prescription has been provided for azelastine nasal spray, 1-2 sprays per nostril twice daily if needed.    Continue nasal saline irrigation as needed and prior to medicated nasal sprays.Marland Kitchen

## 2017-04-16 NOTE — Patient Instructions (Addendum)
Asthma with acute exacerbation A prescription has been provided for Symbicort (budesonide/formoterol) 160/4.5 g,  2 inhalations twice a day. To maximize pulmonary deposition, a spacer has been provided along with instructions for its proper administration with an HFA inhaler.  Continue albuterol HFA, 1-2 inhalations via spacer device every 4-6 hours as needed and 15 minutes prior to exercise.  If her symptoms persist or progress over the next couple days despite the treatment plan as outlined above, prednisone has been provided, 40 mg x3 days, 20 mg x1 day, 10 mg x1 day, then stop.  The patient has been asked to contact me if her symptoms persist or progress. Otherwise, she may return for follow up in 4 months.  Allergic rhinitis  Continue appropriate allergen avoidance measures and cetirizine 10 mg daily as needed.  A refill prescription has been provided for azelastine nasal spray, 1-2 sprays per nostril twice daily if needed.    Continue nasal saline irrigation as needed and prior to medicated nasal sprays..   Return in about 4 months (around 08/14/2017), or if symptoms worsen or fail to improve.

## 2017-04-16 NOTE — Assessment & Plan Note (Addendum)
A prescription has been provided for Symbicort (budesonide/formoterol) 160/4.5 g, 2 inhalations twice a day. To maximize pulmonary deposition, a spacer has been provided along with instructions for its proper administration with an HFA inhaler.  Continue albuterol HFA, 1-2 inhalations via spacer device every 4-6 hours as needed and 15 minutes prior to exercise.  If her symptoms persist or progress over the next couple days despite the treatment plan as outlined above, prednisone has been provided, 40 mg x3 days, 20 mg x1 day, 10 mg x1 day, then stop.  The patient has been asked to contact me if her symptoms persist or progress. Otherwise, she may return for follow up in 4 months.

## 2017-04-17 ENCOUNTER — Encounter: Payer: Self-pay | Admitting: Allergy and Immunology

## 2017-04-25 ENCOUNTER — Encounter: Payer: Self-pay | Admitting: Neurology

## 2017-04-25 ENCOUNTER — Ambulatory Visit (INDEPENDENT_AMBULATORY_CARE_PROVIDER_SITE_OTHER): Payer: BLUE CROSS/BLUE SHIELD | Admitting: Neurology

## 2017-04-25 DIAGNOSIS — H538 Other visual disturbances: Secondary | ICD-10-CM

## 2017-04-25 DIAGNOSIS — R5383 Other fatigue: Secondary | ICD-10-CM

## 2017-04-25 DIAGNOSIS — R51 Headache: Secondary | ICD-10-CM | POA: Diagnosis not present

## 2017-04-25 MED ORDER — RIZATRIPTAN BENZOATE 10 MG PO TBDP: 10.0000 mg | ORAL_TABLET | ORAL | 11 refills | Status: DC | PRN

## 2017-04-25 MED ORDER — BUTALBITAL-APAP-CAFFEINE 50-325-40 MG PO TABS: 1.0000 | ORAL_TABLET | Freq: Four times a day (QID) | ORAL | 0 refills | Status: DC | PRN

## 2017-04-25 NOTE — Patient Instructions (Addendum)
Please remember, common headache triggers are: sleep deprivation, dehydration, overheating, stress, hypoglycemia or skipping meals and blood sugar fluctuations, excessive pain medications or excessive alcohol use or caffeine withdrawal. Some people have food triggers such as aged cheese, orange juice or chocolate, especially dark chocolate, or MSG (monosodium glutamate). Try to avoid these headache triggers as much possible. It may be helpful to keep a headache diary to figure out what makes your headaches worse or brings them on and what alleviates them. Some people report headache onset after exercise but studies have shown that regular exercise may actually prevent headaches from coming. If you have exercise-induced headaches, please make sure that you drink plenty of fluid before and after exercising and that you do not over do it and do not overheat.  We are going to request insurance authorization for Botox injections for your intractable migraine headaches. In the meantime, please continue to take Maxalt as needed and be cautious with the use of Advil/Motrin d/t heart burn.  We can consider Imitrex injections down the road.   Please try to get Dr. Audery Amel records for me. Your PCP may have them, if his office is closed.   We can try Fioricet for as needed use, however, we will do a one-time prescription with no refills. Please be aware that this is to be used cautiously as it can be addicting and sedating. It is not for daily use.

## 2017-04-25 NOTE — Progress Notes (Signed)
Subjective:    Patient ID: Morgan Long is a 46 y.o. female.  HPI     Star Age, MD, PhD [facility] [address], [address] [place], [place] [address]  Morgan Long is a 46 year old right-handed woman with an underlying medical history of allergic rhinitis, endometriosis, TMJ problems, and neck pain, as well as obesity, who presents for evaluation of her headaches. Her headache specialist retired. I last saw her on 09/07/2015 for follow-up for her blurry vision. Her headaches were stable. She had come off of Zonegran and was started on nortriptyline by Dr. Melton Alar.   Today, 04/25/17: She reports a long-standing history of migraine headaches, she recalls she was about 46 years old when she started having migraines. For quite some time she had spontaneous improvement in her migraines but some 4 or 5 years ago started having more frequent migraines. She has tried and failed multiple preventative medications but does not have a list with her and prior records from Dr. Audery Amel office are not available today. She reports that his office is closed at this point. She did not get her records from his office. Perhaps her primary care provider has records. I encouraged her to try to obtain those records as it will help [name] manage her migraines optimally. She recalls having tried Topamax. She had hair loss from some medications but is not completely sure which ones. Topamax did not help, Zonegran caused her sedation, nortriptyline was also tried but she is unsure what happened with it. Amitriptyline she may have tried as well. She did not try Depakote. She is worried about sedation side effects as she also is going to school and has to be alert throughout the day. She takes occasional nausea medicine which likely is Zofran under the tongue. She has tried Maxalt under the tongue which is helpful at times. She needs a refill on that. She has regular eye exams because of her  history of dry eyes. She used to wear a single contact but was advised to stop using it.  She has had 2 rounds of Botox injections, last time about 3 months ago as she recalls. She remembers doing okay with it. Prior to starting Botox injections she reports having about 18-20 headache days per month typically. She does not always have a typical aura but sometimes has visual blurriness and bringing in her ears to warn her about an upcoming migraine headache. She has occasional nausea that precedes the headache.   Previously:    I saw her on 06/09/2015 at which time she was referred is a new patient referral for a new problem, referred by her optometrist at the time for 1 month history of blurry vision. Her exam at the time was nonfocal and reassuringly she had no significant eye related findings. I suggested further workup in the form of visual evoked potentials, blood work, and she also reported a 6 month history of feeling tired. She had no one-sided weakness, tingling or numbness. Symptoms from the past which included paresthesias had resolved completely. She was under the care of Dr. Melton Alar for migraines. She was in the process of titrating Zonegran. She was on 225 mg each night. When she took 300 mg each night she felt too sleepy during the day. She denied any symptoms of sleep disordered breathing. She did admit not being a good sleeper. She did endorse stress as she was in school online for psychology and was also working off-and-on in Personal assistant.  She reported not drinking sodas, not drinking alcohol and she reported not smoking. She did report pain with eye movements and dry eyes.  She reported no family history of multiple sclerosis or lupus. She denied joint pain with the exception of bilateral knee pain and she also reported a 50 pound weight gain in the last year. She had visual evoked potentials on 07/06/2015: Impression: The visual evoked response test above was within normal limits bilaterally.  No evidence of conduction slowing was seen within the anterior visual pathways on either side on today's evaluation.   We called her with her test results. Labs from 06/09/2015 showed normal A1c, normal vitamin D level, normal ANA, normal RF, normal ESR. CRP was elevated at 15.6. 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   She had a brain MRI with and without contrast on 06/23/2015: IMPRESSION:  This is a normal MRI of the brain with and without contrast  In addition, personally reviewed the images through the PACS system. We called her with her test results.   I first met her on 05/22/2014 at the request of her neurosurgeon, Dr. Kathyrn Sheriff, at which time the patient reported intermittent right arm numbness, particularly with neck position changes. I suggested blood work and EMG and nerve conduction testing of the right upper extremity. Her blood work showed elevated B12 and B6 levels, indicative of B vitamin supplementation. Hemoglobin A1c was 5.7. We called her with her test results. She had EMG and nerve conduction testing on 06/02/2014: IMPRESSION:   Nerve conduction studies done on both upper extremities were unremarkable, without evidence of a neuropathy seen. EMG evaluation of the right upper extremity was unremarkable, without evidence of an overlying cervical radiculopathy. We called her with her test results. At the time, she reported improved symptoms.   05/22/2014: She has intermittent right arm numbness. Her symptoms have been going on for about 6 months. She has not noted any permanent numbness and no issues elsewhere. She does not have any significant weakness and sometimes feels weak when the numbness seems to come on. It goes away if she changes positions or adjusts her neck position. She has had right shoulder problems and  pain in the right shoulder.   You saw her on 05/14/2014 for neck pain. She had undergone physical therapy without improvement of her neck pain. She had cervical epidural steroid injections which helped for about 24 hours as understand.   She had a C-spine MRI without contrast on 01/07/2014: Mild cervical spondylosis as described above without significant disc protrusion, foraminal stenosis or central canal stenosis.    Blood work from 05/14/2014 was reviewed: She had a BMP, CBC with differential, liver function panel, lipid panel and TSH all of which were fine with the exception of a borderline LDL of 111.   Her Past Medical History Is Significant For: Past Medical History:  Diagnosis Date  . Allergic rhinitis   . Chronic migraine    Dr. Catalina Gravel  . Dry eye syndrome of both lacrimal glands   . Endometriosis   . Gastroparesis   . GERD (gastroesophageal reflux disease)   . Headache(784.0)    occasional, dx w/ Migraines before, Topamax helps  . Localized edema   . Muscle weakness (generalized)   . Nuclear cataract of both eyes    Mild  . Pain in right  ankle and joints of right foot   . Sinusitis   . TMJ pain dysfunction syndrome    occasional    Her Past Surgical History Is Significant For: Past Surgical History:  Procedure Laterality Date  . AUGMENTATION MAMMAPLASTY Bilateral 2006  . BREAST ENHANCEMENT SURGERY  2006  . COLONOSCOPY    . endrometroisis    . fallopian tube removed     Left  . UPPER GASTROINTESTINAL ENDOSCOPY    . wisdoim teeth extraction      Her Family History Is Significant For: Family History  Problem Relation Age of Onset  . Throat cancer Mother   . Cancer - Other Mother 55       throat - died 64 months  . Heart disease Father   . Breast cancer Maternal Aunt        breast  . Colon cancer Maternal Uncle 3       died 36  . Breast cancer Maternal Aunt        breast  . Pancreatic cancer Maternal Aunt   . Diabetes Unknown        grandmother  .  Hypertension Maternal Aunt        several family members  . Throat cancer Maternal Uncle   . Asthma Unknown        cousin, maternal  . Breast cancer Maternal Aunt        total of 5 aunts  . Breast cancer Maternal Aunt   . Colon cancer Paternal Grandfather   . Heart attack Neg Hx   . Rectal cancer Neg Hx   . Stomach cancer Neg Hx     Her Social History Is Significant For: Social History   Socioeconomic History  . Marital status: Single    Spouse name: None  . Number of children: 0  . Years of education: BS  . Highest education level: None  Social Needs  . Financial resource strain: None  . Food insecurity - worry: None  . Food insecurity - inability: None  . Transportation needs - medical: None  . Transportation needs - non-medical: None  Occupational History  . Occupation: Paramedic, going to school    Employer: [employer]  . Occupation: student-- clinical psychology  Tobacco Use  . Smoking status: Never Smoker  . Smokeless tobacco: Never Used  Substance and Sexual Activity  . Alcohol use: Yes    Alcohol/week: 0.0 oz    Comment: socially - occasional   . Drug use: No  . Sexual activity: Yes    Birth control/protection: Inserts    Comment: nuvaring  Other Topics Concern  . None  Social History Narrative   Household:sister and her 3 kids    Drinks occasional starbucks drink        Her Allergies Are:  Allergies  Allergen Reactions  . Acetaminophen Itching    Patient can take tylenol, just not tylenol#3  . Hydrocodone Nausea Only  . Oxycodone Itching  . Phosphate Itching    sick  . Codeine Itching and Nausea Only  :   Her Current Medications Are:  Outpatient Encounter Medications as of 04/25/2017  Medication Sig  . albuterol (PROAIR HFA) 108 (90 Base) MCG/ACT inhaler INHALE TWO PUFFS EVERY 4-6 HOURS IF NEEDED FOR COUGH WHEEZE.  Marland Kitchen AMBULATORY NON FORMULARY MEDICATION Medication Name: Domperidone 10 mg tablets Take one po before meals and at  bedtime. May increase to 20 mg before meals when needed (max 7 tablets daily)  .  azelastine (ASTELIN) 0.1 % nasal spray Place 1-2 sprays 2 (two) times daily into both nostrils.  . beclomethasone (QVAR REDIHALER) 80 MCG/ACT inhaler Inhale 2 puffs 2 (two) times daily into the lungs.  . budesonide-formoterol (SYMBICORT) 160-4.5 MCG/ACT inhaler Inhale 2 puffs 2 (two) times daily into the lungs.  . cetirizine (ZYRTEC) 10 MG tablet Take 1 tablet (10 mg total) daily by mouth.  . ESTROGEL 0.75 MG/1.25 GM (0.06%) topical gel Apply 1 application topically See admin instructions.  Marland Kitchen etonogestrel-ethinyl estradiol (NUVARING) 0.12-0.015 MG/24HR vaginal ring Place 1 each vaginally every 28 (twenty-eight) days. Insert vaginally and leave in place for 3 consecutive weeks, then remove for 1 week.   . Fluocinolone Acetonide 0.01 % OIL Place 1 drop into both ears as needed.  Marland Kitchen ibuprofen (ADVIL,MOTRIN) 800 MG tablet Take 800 mg by mouth every 6 (six) hours as needed. for pain  . levofloxacin (LEVAQUIN) 500 MG tablet Take 1 tablet (500 mg total) by mouth daily.  Marland Kitchen LINZESS 72 MCG capsule TAKE 1 CAPSULE (72 MCG TOTAL) BY MOUTH DAILY BEFORE BREAKFAST.  . Multiple Vitamin (MULTIVITAMIN) LIQD Take 5 mLs by mouth daily.  . ondansetron (ZOFRAN) 4 MG tablet Take 1 tablet (4 mg total) by mouth as needed for nausea or vomiting (may take 8 mg before each half of prep).  . phentermine 37.5 MG capsule Take 37.5 mg by mouth every morning.  . promethazine-dextromethorphan (PROMETHAZINE-DM) 6.25-15 MG/5ML syrup Take 5 mLs by mouth 4 (four) times daily as needed.  . ranitidine (ZANTAC) 150 MG tablet Take 1 tablet (150 mg total) 2 (two) times daily by mouth.  . RESTASIS MULTIDOSE 0.05 % ophthalmic emulsion Place 1 drop into both eyes as needed.  . butalbital-acetaminophen-caffeine (FIORICET, ESGIC) 50-325-40 MG tablet Take 1 tablet by mouth every 6 (six) hours as needed for headache.  . rizatriptan (MAXALT-MLT) 10 MG disintegrating  tablet Take 1 tablet (10 mg total) by mouth as needed for migraine. May repeat in 2 hours if needed   Facility-Administered Encounter Medications as of 04/25/2017  Medication  . 0.9 %  sodium chloride infusion  : Review of Systems:  Out of a complete 14 point review of systems, all are reviewed and negative with the exception of these symptoms as listed below:  Review of Systems  Neurological:       Pt's headaches are increasing. Pt was getting botox through Dr. Audery Amel office and thought it did it work.    Objective:  Neurological Exam  Physical Exam Physical Examination:   Vitals:   04/25/17 0846  BP: 130/82  Pulse: 98    General Examination: The patient is a very pleasant 46 y.o. female in no acute distress. She appears well-developed and well-nourished and well groomed.   HEENT: Normocephalic, atraumatic, pupils are equal, round and reactive to light and accommodation. Extraocular tracking is good without limitation to gaze excursion or nystagmus noted. She has mildly dry appearing eyes. Normal smooth pursuit is noted. Hearing is grossly intact. Face is symmetric with normal facial animation and normal facial sensation. Speech is clear with no dysarthria noted. There is no hypophonia. There is no lip, neck/head, jaw or voice tremor. Neck is supple with full range of passive and active motion. Oropharynx exam reveals: mild mouth dryness, good dental hygiene and mild airway/pharyngeal erythema. Tongue protrudes centrally and palate elevates symmetrically.  Chest: Clear to auscultation without wheezing, rhonchi or crackles noted.  Heart: S1+S2+0, regular and normal without murmurs, rubs or gallops noted.   Abdomen: Soft,  non-tender and non-distended with normal bowel sounds appreciated on auscultation.  Extremities: There is no pitting edema in the distal lower extremities bilaterally. Pedal pulses are intact.  Skin: Warm and dry without trophic changes noted. There are  no varicose veins.  Musculoskeletal: exam reveals no obvious joint deformities, tenderness or joint swelling or erythema.   Neurologically:  Mental status: The patient is awake, alert and oriented in all 4 spheres. Her immediate and remote memory, attention, language skills and fund of knowledge are appropriate. There is no evidence of aphasia, agnosia, apraxia or anomia. Speech is clear with normal prosody and enunciation. Thought process is linear. Mood is normal and affect is normal.  Cranial nerves II - XII are as described above under HEENT exam.  Motor exam: Normal bulk, strength and tone is noted. There is no drift, tremor or rebound. Romberg is negative. Reflexes are 1-2+ throughout. Fine motor skills and coordination: intact with normal finger taps, normal hand movements, normal rapid alternating patting, normal foot taps and normal foot agility.  Cerebellar testing: No dysmetria or intention tremor on finger to nose testing. Heel to shin is unremarkable bilaterally. There is no truncal or gait ataxia.  Sensory exam: intact to light touch, vibration, temperature sense in the upper and lower extremities.  Gait, station and balance: She stands easily. No veering to one side is noted. No leaning to one side is noted. Posture is age-appropriate and stance is narrow based. Gait shows normal stride length and normal pace. No problems turning are noted. Tandem walk is unremarkable.   Assessment and Plan:   In summary, Morgan Long is a very pleasant 46 year old female with an underlying medical history of allergic rhinitis, endometriosis, TMJ problems, and neck pain, as well as obesity, who presents for new patient visit for her migraine headaches. She has a long-standing history of migraines which have been unresponsive to oral preventative medications. For abortive treatment she had some success with Maxalt taken under the tongue. She was given a prescription for this today. She has tried  and failed at least Topamax, nortriptyline, Zonegran, and perhaps propranolol and amitriptyline. Physical exam is nonfocal, neurological exam is also nonfocal. She is encouraged to obtain prior records from her headache specialist if possible, her primary care provider may have records as well. Today we talked about her Botox injections. She had noticed some improvement after the first or second round of Botox. She would be due for injections it sounds like right about now. We will have to re-consent. Written informed consent was obtained today. We talked about Botox side effects, expectations, limitations and benefit. We will proceed to schedule her as soon as we obtain insurance authorization for the procedure. We talked a little bit about headache and migraine triggers. She is encouraged to stay well rested, well hydrated, avoid food triggers if she has any. We also brushed upon potential future treatment options including injectables that have come out recently. Nevertheless, at this time we will pursue Botox injections as she has noticed some effect from it and was previously authorized to have these injections for migraine prevention. I answered all her questions today and she was in agreement.  Of note, she had a prior brain MRI with and without contrast and we also have on file visual evoked potentials which were unremarkable in the past.

## 2017-05-11 DIAGNOSIS — H16223 Keratoconjunctivitis sicca, not specified as Sjogren's, bilateral: Secondary | ICD-10-CM | POA: Diagnosis not present

## 2017-05-11 DIAGNOSIS — H04123 Dry eye syndrome of bilateral lacrimal glands: Secondary | ICD-10-CM | POA: Diagnosis not present

## 2017-05-21 DIAGNOSIS — F458 Other somatoform disorders: Secondary | ICD-10-CM | POA: Diagnosis not present

## 2017-05-21 DIAGNOSIS — K219 Gastro-esophageal reflux disease without esophagitis: Secondary | ICD-10-CM | POA: Diagnosis not present

## 2017-05-22 DIAGNOSIS — Z01419 Encounter for gynecological examination (general) (routine) without abnormal findings: Secondary | ICD-10-CM | POA: Diagnosis not present

## 2017-05-22 DIAGNOSIS — Z8042 Family history of malignant neoplasm of prostate: Secondary | ICD-10-CM | POA: Diagnosis not present

## 2017-05-22 DIAGNOSIS — E559 Vitamin D deficiency, unspecified: Secondary | ICD-10-CM | POA: Diagnosis not present

## 2017-05-22 DIAGNOSIS — Z808 Family history of malignant neoplasm of other organs or systems: Secondary | ICD-10-CM | POA: Diagnosis not present

## 2017-05-22 DIAGNOSIS — Z803 Family history of malignant neoplasm of breast: Secondary | ICD-10-CM | POA: Diagnosis not present

## 2017-05-22 DIAGNOSIS — R635 Abnormal weight gain: Secondary | ICD-10-CM | POA: Diagnosis not present

## 2017-05-22 DIAGNOSIS — Z6837 Body mass index (BMI) 37.0-37.9, adult: Secondary | ICD-10-CM | POA: Diagnosis not present

## 2017-05-23 ENCOUNTER — Telehealth: Payer: Self-pay

## 2017-05-23 ENCOUNTER — Ambulatory Visit: Payer: BLUE CROSS/BLUE SHIELD | Admitting: Neurology

## 2017-05-23 NOTE — Telephone Encounter (Signed)
Pt did not show for their botox appt with Dr. Rexene Alberts today.

## 2017-05-30 ENCOUNTER — Encounter: Payer: Self-pay | Admitting: Neurology

## 2017-05-30 NOTE — Telephone Encounter (Signed)
Pt is asking for a call back on her direct number at work to be scheduled for her Botox

## 2017-05-30 NOTE — Telephone Encounter (Signed)
Pt has called again to see if Hinton Dyer were available to speak with her, pt is asking for a call back on [phone]

## 2017-05-30 NOTE — Telephone Encounter (Signed)
Pt called to r/s botox appt. Pt said she was given the date of 12/28 and only called in today bc she saw msg on mychart she missed the appt. Pt was advised of the no show fee, pt said she did not feel she should have to pay that if she was given the wrong date. Pt was advised Dr Rexene Alberts is not in the office this week. Pt was transferred to billing Cyndra Numbers)

## 2017-05-31 NOTE — Telephone Encounter (Signed)
Spoke to patient she is rescheduled.

## 2017-06-06 ENCOUNTER — Telehealth: Payer: Self-pay | Admitting: Internal Medicine

## 2017-06-06 NOTE — Telephone Encounter (Signed)
Patient advised that she needs to have an EKG completed at her PCP office prior to [name] refilling domperidone. I advised that she should let us know once she has this completed and once we get records, we can go ahead and send domperidone over to [facility]. She verbalizes understanding.

## 2017-06-06 NOTE — Telephone Encounter (Signed)
Dr Hilarie Fredrickson, patient is requesting refills on domperidone. It appears her last EKG was completed 10/2015. Am I correct in advising her that she needs to have an EKG BEFORE we can refill more domperidone?

## 2017-06-06 NOTE — Telephone Encounter (Signed)
Yes Her PCP should be able to do this.

## 2017-06-06 NOTE — Telephone Encounter (Signed)
Noted, thank you

## 2017-06-06 NOTE — Telephone Encounter (Signed)
A user error has taken place.

## 2017-06-07 ENCOUNTER — Telehealth: Payer: Self-pay | Admitting: *Deleted

## 2017-06-07 NOTE — Telephone Encounter (Signed)
She needs ov 

## 2017-06-07 NOTE — Telephone Encounter (Signed)
Patient is on schedule for next week

## 2017-06-07 NOTE — Telephone Encounter (Signed)
Copied from [place]. Topic: Inquiry >> Jun 06, 2017  4:52 PM Corie Chiquito, [place] wrote: Reason for CRM: Patient called because she was told by Dr.Jay Pyrtle that she would have to have an EKG done by her PCP in order to get her Domperidone refilled. I reached out to the office about this but was on hold for over 7 min. So if someone from the office could give her a call back about this at [phone]

## 2017-06-07 NOTE — Telephone Encounter (Signed)
Can she do a nurse visit for her?

## 2017-06-10 ENCOUNTER — Encounter: Payer: Self-pay | Admitting: Internal Medicine

## 2017-06-11 ENCOUNTER — Encounter: Payer: Self-pay | Admitting: Internal Medicine

## 2017-06-11 DIAGNOSIS — M25572 Pain in left ankle and joints of left foot: Secondary | ICD-10-CM | POA: Diagnosis not present

## 2017-06-11 DIAGNOSIS — M25571 Pain in right ankle and joints of right foot: Secondary | ICD-10-CM | POA: Diagnosis not present

## 2017-06-12 ENCOUNTER — Encounter: Payer: Self-pay | Admitting: Family Medicine

## 2017-06-12 ENCOUNTER — Ambulatory Visit: Payer: BLUE CROSS/BLUE SHIELD | Admitting: Family Medicine

## 2017-06-12 VITALS — BP 100/66 | HR 89 | Temp 98.7°F | Ht 63.0 in | Wt 209.0 lb

## 2017-06-12 DIAGNOSIS — G43709 Chronic migraine without aura, not intractable, without status migrainosus: Secondary | ICD-10-CM | POA: Diagnosis not present

## 2017-06-12 DIAGNOSIS — Z79899 Other long term (current) drug therapy: Secondary | ICD-10-CM

## 2017-06-12 DIAGNOSIS — E669 Obesity, unspecified: Secondary | ICD-10-CM

## 2017-06-12 DIAGNOSIS — K3184 Gastroparesis: Secondary | ICD-10-CM

## 2017-06-12 DIAGNOSIS — R519 Headache, unspecified: Secondary | ICD-10-CM

## 2017-06-12 DIAGNOSIS — R51 Headache: Secondary | ICD-10-CM | POA: Diagnosis not present

## 2017-06-12 MED ORDER — KETOROLAC TROMETHAMINE 60 MG/2ML IM SOLN
60.0000 mg | Freq: Once | INTRAMUSCULAR | Status: AC
  Administered 2017-06-12: 60 mg via INTRAMUSCULAR

## 2017-06-12 MED ORDER — OMEPRAZOLE 40 MG PO CPDR: 40.0000 mg | DELAYED_RELEASE_CAPSULE | Freq: Two times a day (BID) | ORAL | 3 refills | Status: DC

## 2017-06-12 NOTE — Patient Instructions (Signed)

## 2017-06-12 NOTE — Assessment & Plan Note (Signed)
Per GI  EKG done at their request and was normal

## 2017-06-12 NOTE — Progress Notes (Signed)
Subjective:  I acted as a Education administrator for [employer], [place]   Patient ID: Morgan Long, female    DOB: [birth year], 47 y.o.   MRN: [id number]  Chief Complaint  Patient presents with  . Abnormal ECG    Medication cause heart issues and wants to monitor   . Headache    Onset about 2 and a half months. Pt states she has them about every other day starting from the time she wake sup or later in the evening.     HPI  Patient is in today for an EKG and headache.  Pt has hx of migraines and sees headache specialist --- her appointment is next week.  Headache is her normal migraine.    Patient Care Team: Carollee Herter, Alferd Apa, DO as PCP - General (Family Medicine) Lyndal Pulley, DO as Attending Physician (Family Medicine) Virgina Evener, OD as Referring Physician (Optometry) Carollee Herter, Alferd Apa, DO as Consulting Physician (Family Medicine) Leta Baptist, Earlean Polka, MD as Consulting Physician (Neurology)   Past Medical History:  Diagnosis Date  . Allergic rhinitis   . Chronic migraine    Dr. Catalina Gravel  . Dry eye syndrome of both lacrimal glands   . Endometriosis   . Gastroparesis   . GERD (gastroesophageal reflux disease)   . Headache(784.0)    occasional, dx w/ Migraines before, Topamax helps  . Localized edema   . Muscle weakness (generalized)   . Nuclear cataract of both eyes    Mild  . Pain in right ankle and joints of right foot   . Sinusitis   . TMJ pain dysfunction syndrome    occasional    Past Surgical History:  Procedure Laterality Date  . AUGMENTATION MAMMAPLASTY Bilateral 2006  . BREAST ENHANCEMENT SURGERY  2006  . COLONOSCOPY    . endrometroisis    . fallopian tube removed     Left  . UPPER GASTROINTESTINAL ENDOSCOPY    . wisdoim teeth extraction      Family History  Problem Relation Age of Onset  . Throat cancer Mother   . Cancer - Other Mother 55       throat - died 25 months  . Heart disease Father   . Breast cancer Maternal Aunt        breast    . Colon cancer Maternal Uncle 89       died 2  . Breast cancer Maternal Aunt        breast  . Pancreatic cancer Maternal Aunt   . Diabetes Unknown        grandmother  . Hypertension Maternal Aunt        several family members  . Throat cancer Maternal Uncle   . Asthma Unknown        cousin, maternal  . Breast cancer Maternal Aunt        total of 5 aunts  . Breast cancer Maternal Aunt   . Colon cancer Paternal Grandfather   . Heart attack Neg Hx   . Rectal cancer Neg Hx   . Stomach cancer Neg Hx     Social History   Socioeconomic History  . Marital status: Single    Spouse name: Not on file  . Number of children: 0  . Years of education: BS  . Highest education level: Not on file  Social Needs  . Financial resource strain: Not on file  . Food insecurity - worry: Not on file  . Food insecurity -  inability: Not on file  . Transportation needs - medical: Not on file  . Transportation needs - non-medical: Not on file  Occupational History  . Occupation: Paramedic, going to school    Employer: [employer]  . Occupation: student-- clinical psychology  Tobacco Use  . Smoking status: Never Smoker  . Smokeless tobacco: Never Used  Substance and Sexual Activity  . Alcohol use: Yes    Alcohol/week: 0.0 oz    Comment: socially - occasional   . Drug use: No  . Sexual activity: Yes    Birth control/protection: Inserts    Comment: nuvaring  Other Topics Concern  . Not on file  Social History Narrative   Household:sister and her 3 kids    Drinks occasional starbucks drink        Outpatient Medications Prior to Visit  Medication Sig Dispense Refill  . albuterol (PROAIR HFA) 108 (90 Base) MCG/ACT inhaler INHALE TWO PUFFS EVERY 4-6 HOURS IF NEEDED FOR COUGH WHEEZE. 1 Inhaler 1  . AMBULATORY NON FORMULARY MEDICATION Medication Name: Domperidone 10 mg tablets Take one po before meals and at bedtime. May increase to 20 mg before meals when needed (max 7 tablets  daily) 630 capsule 1  . azelastine (ASTELIN) 0.1 % nasal spray Place 1-2 sprays 2 (two) times daily into both nostrils. 30 mL 5  . beclomethasone (QVAR REDIHALER) 80 MCG/ACT inhaler Inhale 2 puffs 2 (two) times daily into the lungs. 10.6 g 5  . budesonide-formoterol (SYMBICORT) 160-4.5 MCG/ACT inhaler Inhale 2 puffs 2 (two) times daily into the lungs. 1 Inhaler 5  . butalbital-acetaminophen-caffeine (FIORICET, ESGIC) 50-325-40 MG tablet Take 1 tablet by mouth every 6 (six) hours as needed for headache. 10 tablet 0  . cetirizine (ZYRTEC) 10 MG tablet Take 1 tablet (10 mg total) daily by mouth. 30 tablet 5  . ESTROGEL 0.75 MG/1.25 GM (0.06%) topical gel Apply 1 application topically See admin instructions.  0  . etonogestrel-ethinyl estradiol (NUVARING) 0.12-0.015 MG/24HR vaginal ring Place 1 each vaginally every 28 (twenty-eight) days. Insert vaginally and leave in place for 3 consecutive weeks, then remove for 1 week.     . Fluocinolone Acetonide 0.01 % OIL Place 1 drop into both ears as needed.  1  . ibuprofen (ADVIL,MOTRIN) 800 MG tablet Take 800 mg by mouth every 6 (six) hours as needed. for pain  3  . levofloxacin (LEVAQUIN) 500 MG tablet Take 1 tablet (500 mg total) by mouth daily. 10 tablet 0  . LINZESS 72 MCG capsule TAKE 1 CAPSULE (72 MCG TOTAL) BY MOUTH DAILY BEFORE BREAKFAST. 30 capsule 8  . Multiple Vitamin (MULTIVITAMIN) LIQD Take 5 mLs by mouth daily.    Marland Kitchen omeprazole (PRILOSEC) 40 MG capsule Take 1 capsule (40 mg total) by mouth 2 (two) times daily. 60 capsule 3  . ondansetron (ZOFRAN) 4 MG tablet Take 1 tablet (4 mg total) by mouth as needed for nausea or vomiting (may take 8 mg before each half of prep). 4 tablet 0  . phentermine 37.5 MG capsule Take 37.5 mg by mouth every morning.    . promethazine-dextromethorphan (PROMETHAZINE-DM) 6.25-15 MG/5ML syrup Take 5 mLs by mouth 4 (four) times daily as needed. 118 mL 0  . ranitidine (ZANTAC) 150 MG tablet Take 1 tablet (150 mg total) 2  (two) times daily by mouth. 60 tablet 5  . RESTASIS MULTIDOSE 0.05 % ophthalmic emulsion Place 1 drop into both eyes as needed.  3  . rizatriptan (MAXALT-MLT) 10 MG  disintegrating tablet Take 1 tablet (10 mg total) by mouth as needed for migraine. May repeat in 2 hours if needed 9 tablet 11   Facility-Administered Medications Prior to Visit  Medication Dose Route Frequency Provider Last Rate Last Dose  . 0.9 %  sodium chloride infusion  500 mL Intravenous Continuous Pyrtle, Lajuan Lines, MD        Allergies  Allergen Reactions  . Acetaminophen Itching    Patient can take tylenol, just not tylenol#3  . Hydrocodone Nausea Only  . Oxycodone Itching  . Phosphate Itching    sick  . Codeine Itching and Nausea Only    Review of Systems  Constitutional: Negative for chills, fever and malaise/fatigue.  HENT: Negative for congestion and hearing loss.   Eyes: Negative for discharge.  Respiratory: Negative for cough, sputum production and shortness of breath.   Cardiovascular: Negative for chest pain, palpitations and leg swelling.  Gastrointestinal: Negative for abdominal pain, blood in stool, constipation, diarrhea, heartburn, nausea and vomiting.  Genitourinary: Negative for dysuria, frequency, hematuria and urgency.  Musculoskeletal: Negative for back pain, falls and myalgias.  Skin: Negative for rash.  Neurological: Positive for headaches. Negative for dizziness, sensory change, loss of consciousness and weakness.  Endo/Heme/Allergies: Negative for environmental allergies. Does not bruise/bleed easily.  Psychiatric/Behavioral: Negative for depression and suicidal ideas. The patient is not nervous/anxious and does not have insomnia.        Objective:    Physical Exam  Constitutional: She is oriented to person, place, and time. She appears well-developed and well-nourished.  HENT:  Head: Normocephalic and atraumatic.  Eyes: Conjunctivae and EOM are normal.  Neck: Normal range of motion.  Neck supple. No JVD present. Carotid bruit is not present. No thyromegaly present.  Cardiovascular: Normal rate, regular rhythm and normal heart sounds.  No murmur heard. Pulmonary/Chest: Effort normal and breath sounds normal. No respiratory distress. She has no wheezes. She has no rales. She exhibits no tenderness.  Musculoskeletal: She exhibits no edema.  Neurological: She is alert and oriented to person, place, and time. No cranial nerve deficit. Coordination normal.  Psychiatric: She has a normal mood and affect.  Nursing note and vitals reviewed.   BP 100/66   Pulse 89   Temp 98.7 F (37.1 C) (Oral)   Ht 5\' 3"  (1.6 m)   Wt 209 lb (94.8 kg)   LMP 05/28/2017 (Exact Date)   SpO2 98%   BMI 37.02 kg/m  Wt Readings from Last 3 Encounters:  06/12/17 209 lb (94.8 kg)  04/25/17 211 lb (95.7 kg)  04/16/17 211 lb 3.2 oz (95.8 kg)   BP Readings from Last 3 Encounters:  06/12/17 100/66  04/25/17 130/82  04/16/17 130/80     Immunization History  Administered Date(s) Administered  . Influenza Split 03/28/2011  . Influenza Whole 05/14/2007  . Influenza,inj,Quad PF,6+ Mos 04/24/2013, 02/27/2014, 02/27/2017  . Td 07/08/2007    Health Maintenance  Topic Date Due  . PAP SMEAR  04/07/2017  . TETANUS/TDAP  07/07/2017  . INFLUENZA VACCINE  Completed  . HIV Screening  Completed    Lab Results  Component Value Date   WBC 9.7 03/30/2017   HGB 12.8 03/30/2017   HCT 39.4 03/30/2017   PLT 416 (H) 03/30/2017   GLUCOSE 89 02/27/2017   CHOL 199 05/14/2014   TRIG 61.0 05/14/2014   HDL 75.40 05/14/2014   LDLCALC 111 (H) 05/14/2014   ALT 15 02/27/2017   AST 14 02/27/2017   NA 137 02/27/2017  K 4.1 02/27/2017   CL 104 02/27/2017   CREATININE 0.91 02/27/2017   BUN 12 02/27/2017   CO2 26 02/27/2017   TSH 1.26 05/14/2014   HGBA1C 5.6 06/09/2015    Lab Results  Component Value Date   TSH 1.26 05/14/2014   Lab Results  Component Value Date   WBC 9.7 03/30/2017   HGB 12.8  03/30/2017   HCT 39.4 03/30/2017   MCV 87.9 03/30/2017   PLT 416 (H) 03/30/2017   Lab Results  Component Value Date   NA 137 02/27/2017   K 4.1 02/27/2017   CO2 26 02/27/2017   GLUCOSE 89 02/27/2017   BUN 12 02/27/2017   CREATININE 0.91 02/27/2017   BILITOT 0.2 02/27/2017   ALKPHOS 57 02/27/2017   AST 14 02/27/2017   ALT 15 02/27/2017   PROT 6.9 02/27/2017   ALBUMIN 3.7 02/27/2017   CALCIUM 8.7 02/27/2017   ANIONGAP 7 03/19/2015   GFR 84.74 02/27/2017   Lab Results  Component Value Date   CHOL 199 05/14/2014   Lab Results  Component Value Date   HDL 75.40 05/14/2014   Lab Results  Component Value Date   LDLCALC 111 (H) 05/14/2014   Lab Results  Component Value Date   TRIG 61.0 05/14/2014   Lab Results  Component Value Date   CHOLHDL 3 05/14/2014   Lab Results  Component Value Date   HGBA1C 5.6 06/09/2015         Assessment & Plan:   Problem List Items Addressed This Visit      Unprioritized   Chronic migraine without aura without status migrainosus, not intractable    F/u headache specialist toradol given to day rto prn      Relevant Medications   ketorolac (TORADOL) injection 60 mg (Completed)   Gastroparesis    Per GI  EKG done at their request and was normal       Medication management - Primary   Relevant Orders   EKG 12-Lead (Completed)   Nonintractable headache   Relevant Medications   ketorolac (TORADOL) injection 60 mg (Completed)   Obesity (BMI 35.0-39.9 without comorbidity)    D/w pt diet and weight loss  Healthy weight and wellness info given to pt         I am having Morgan Long maintain her etonogestrel-ethinyl estradiol, multivitamin, ESTROGEL, LINZESS, AMBULATORY NON FORMULARY MEDICATION, Fluocinolone Acetonide, RESTASIS MULTIDOSE, ibuprofen, phentermine, ondansetron, levofloxacin, promethazine-dextromethorphan, beclomethasone, albuterol, cetirizine, ranitidine, budesonide-formoterol, azelastine, rizatriptan,  butalbital-acetaminophen-caffeine, and omeprazole. We administered ketorolac. We will continue to administer sodium chloride.  Meds ordered this encounter  Medications  . ketorolac (TORADOL) injection 60 mg    CMA served as scribe during this visit. History, Physical and Plan performed by medical provider. Documentation and orders reviewed and attested to.  [name], DO

## 2017-06-12 NOTE — Telephone Encounter (Signed)
See both patient emails for completeness Patient reports being seen by ENT for what sounds like globus type sensation Has been started on twice daily PPI, omeprazole 40 mg twice daily.  Previously using ranitidine Globus sensation can be associated with reflux She also has a history of gastroparesis  It is reasonable to try omeprazole 40 mg twice daily AC for 1 month to see if GERD is contributing to the sensation she is feeling in her throat, such as globus sensation.  She can use ranitidine 150 mg in the evening for breakthrough heartburn and reflux symptoms if needed.  It is okay to use these together but most patients will not need ranitidine when they are on twice daily PPI  If symptoms fail to improve after 3-4 weeks of the new twice daily PPI therapy, she should be seen for office follow-up

## 2017-06-12 NOTE — Assessment & Plan Note (Signed)
D/w pt diet and weight loss  Healthy weight and wellness info given to pt

## 2017-06-12 NOTE — Assessment & Plan Note (Signed)
F/u headache specialist toradol given to day rto prn

## 2017-06-13 ENCOUNTER — Telehealth: Payer: Self-pay | Admitting: *Deleted

## 2017-06-13 ENCOUNTER — Encounter: Payer: Self-pay | Admitting: *Deleted

## 2017-06-13 MED ORDER — AMBULATORY NON FORMULARY MEDICATION: 2 refills | Status: DC

## 2017-06-13 NOTE — Telephone Encounter (Signed)
-----   Message from Jerene Bears, MD sent at 06/13/2017  8:27 AM EST ----- Okay for domperidone at previous dose JMP  ----- Message ----- From: [name], DO Sent: 06/12/2017   5:27 PM To: Jerene Bears, MD  Her EKG is done ---- you should be able to see it in the system Have a great night!   Kendrick Fries

## 2017-06-18 ENCOUNTER — Telehealth: Payer: Self-pay | Admitting: Neurology

## 2017-06-18 NOTE — Telephone Encounter (Signed)
Kim with [employer] is sending another copy of PA for BOTOX

## 2017-06-18 NOTE — Telephone Encounter (Signed)
Noted and received. Thank you.

## 2017-06-19 ENCOUNTER — Telehealth: Payer: Self-pay | Admitting: *Deleted

## 2017-06-19 ENCOUNTER — Encounter: Payer: Self-pay | Admitting: Family Medicine

## 2017-06-19 ENCOUNTER — Ambulatory Visit: Payer: BLUE CROSS/BLUE SHIELD | Admitting: Family Medicine

## 2017-06-19 VITALS — BP 118/66 | HR 87 | Temp 98.4°F | Resp 16 | Ht 63.0 in | Wt 208.2 lb

## 2017-06-19 DIAGNOSIS — G44211 Episodic tension-type headache, intractable: Secondary | ICD-10-CM

## 2017-06-19 MED ORDER — KETOROLAC TROMETHAMINE 60 MG/2ML IM SOLN
60.0000 mg | Freq: Once | INTRAMUSCULAR | Status: AC
  Administered 2017-06-19: 60 mg via INTRAMUSCULAR

## 2017-06-19 MED ORDER — ELETRIPTAN HYDROBROMIDE 20 MG PO TABS
20.0000 mg | ORAL_TABLET | ORAL | 0 refills | Status: DC | PRN
Start: 2017-06-19 — End: 2017-07-13

## 2017-06-19 NOTE — Telephone Encounter (Signed)
Copied from [facility] [phone]. Topic: General - Other >> Jun 19, 2017  1:18 PM Ether Griffins B wrote: Reason for CRM: pt wanting to let office know she has had a pap smear  with her obgyn in 05/2017

## 2017-06-19 NOTE — Patient Instructions (Signed)

## 2017-06-19 NOTE — Progress Notes (Signed)
Patient ID: Morgan Long, female   DOB: [birth year], 47 y.o.   MRN: [id number]    Subjective:  I acted as a Education administrator for Dr. Carollee Herter.  Guerry Bruin, [place]   Patient ID: Morgan Long, female    DOB: 01/25/1971, 47 y.o.   MRN: [id number]  Chief Complaint  Patient presents with  . Headache    HPI  Patient is in today for headache.  She has an appointment with neuro at the end of the month.  immitrex really does not work   Patient Care Team: Carollee Herter, Alferd Apa, DO as PCP - General (Family Medicine) Lyndal Pulley, DO as Attending Physician (Family Medicine) Virgina Evener, [place] as Referring Physician (Optometry) Carollee Herter, Alferd Apa, DO as Consulting Physician (Family Medicine) Leta Baptist, Earlean Polka, MD as Consulting Physician (Neurology)   Past Medical History:  Diagnosis Date  . Allergic rhinitis   . Chronic migraine    Dr. Catalina Gravel  . Dry eye syndrome of both lacrimal glands   . Endometriosis   . Gastroparesis   . GERD (gastroesophageal reflux disease)   . Headache(784.0)    occasional, dx w/ Migraines before, Topamax helps  . Localized edema   . Muscle weakness (generalized)   . Nuclear cataract of both eyes    Mild  . Pain in right ankle and joints of right foot   . Sinusitis   . TMJ pain dysfunction syndrome    occasional    Past Surgical History:  Procedure Laterality Date  . AUGMENTATION MAMMAPLASTY Bilateral 2006  . BREAST ENHANCEMENT SURGERY  2006  . COLONOSCOPY    . endrometroisis    . fallopian tube removed     Left  . UPPER GASTROINTESTINAL ENDOSCOPY    . wisdo[place] teeth extraction      Family History  Problem Relation Age of Onset  . Throat cancer Mother   . Cancer - Other Mother 55       throat - died 69 months  . Heart disease Father   . Breast cancer Maternal Aunt        breast  . Colon cancer Maternal Uncle 31       died 41  . Breast cancer Maternal Aunt        breast  . Pancreatic cancer Maternal Aunt   . Diabetes Unknown        grandmother   . Hypertension Maternal Aunt        several family members  . Throat cancer Maternal Uncle   . Asthma Unknown        cousin, maternal  . Breast cancer Maternal Aunt        total of 5 aunts  . Breast cancer Maternal Aunt   . Colon cancer Paternal Grandfather   . Heart attack Neg Hx   . Rectal cancer Neg Hx   . Stomach cancer Neg Hx     Social History   Socioeconomic History  . Marital status: Single    Spouse name: Not on file  . Number of children: 0  . Years of education: BS  . Highest education level: Not on file  Social Needs  . Financial resource strain: Not on file  . Food insecurity - worry: Not on file  . Food insecurity - inability: Not on file  . Transportation needs - medical: Not on file  . Transportation needs - non-medical: Not on file  Occupational History  . Occupation: Paramedic, going to school  Employer: [employer]  . Occupation: student-- clinical psychology  Tobacco Use  . Smoking status: Never Smoker  . Smokeless tobacco: Never Used  Substance and Sexual Activity  . Alcohol use: Yes    Alcohol/week: 0.0 oz    Comment: socially - occasional   . Drug use: No  . Sexual activity: Yes    Birth control/protection: Inserts    Comment: nuvaring  Other Topics Concern  . Not on file  Social History Narrative   Household:sister and her 3 kids    Drinks occasional starbucks drink        Outpatient Medications Prior to Visit  Medication Sig Dispense Refill  . albuterol (PROAIR HFA) 108 (90 Base) MCG/ACT inhaler INHALE TWO PUFFS EVERY 4-6 HOURS IF NEEDED FOR COUGH WHEEZE. 1 Inhaler 1  . AMBULATORY NON FORMULARY MEDICATION Medication Name: Domperidone 10 mg tablets Take one po before meals and at bedtime. May increase to 20 mg before meals when needed (max 7 tablets daily) 630 capsule 2  . azelastine (ASTELIN) 0.1 % nasal spray Place 1-2 sprays 2 (two) times daily into both nostrils. 30 mL 5  . beclomethasone (QVAR REDIHALER) 80 MCG/ACT  inhaler Inhale 2 puffs 2 (two) times daily into the lungs. 10.6 g 5  . budesonide-formoterol (SYMBICORT) 160-4.5 MCG/ACT inhaler Inhale 2 puffs 2 (two) times daily into the lungs. 1 Inhaler 5  . butalbital-acetaminophen-caffeine (FIORICET, ESGIC) 50-325-40 MG tablet Take 1 tablet by mouth every 6 (six) hours as needed for headache. 10 tablet 0  . cetirizine (ZYRTEC) 10 MG tablet Take 1 tablet (10 mg total) daily by mouth. 30 tablet 5  . ESTROGEL 0.75 MG/1.25 GM (0.06%) topical gel Apply 1 application topically See admin instructions.  0  . etonogestrel-ethinyl estradiol (NUVARING) 0.12-0.015 MG/24HR vaginal ring Place 1 each vaginally every 28 (twenty-eight) days. Insert vaginally and leave in place for 3 consecutive weeks, then remove for 1 week.     . Fluocinolone Acetonide 0.01 % OIL Place 1 drop into both ears as needed.  1  . ibuprofen (ADVIL,MOTRIN) 800 MG tablet Take 800 mg by mouth every 6 (six) hours as needed. for pain  3  . levofloxacin (LEVAQUIN) 500 MG tablet Take 1 tablet (500 mg total) by mouth daily. 10 tablet 0  . LINZESS 72 MCG capsule TAKE 1 CAPSULE (72 MCG TOTAL) BY MOUTH DAILY BEFORE BREAKFAST. 30 capsule 8  . Multiple Vitamin (MULTIVITAMIN) LIQD Take 5 mLs by mouth daily.    Marland Kitchen omeprazole (PRILOSEC) 40 MG capsule Take 1 capsule (40 mg total) by mouth 2 (two) times daily. 60 capsule 3  . ondansetron (ZOFRAN) 4 MG tablet Take 1 tablet (4 mg total) by mouth as needed for nausea or vomiting (may take 8 mg before each half of prep). 4 tablet 0  . phentermine 37.5 MG capsule Take 37.5 mg by mouth every morning.    . promethazine-dextromethorphan (PROMETHAZINE-DM) 6.25-15 MG/5ML syrup Take 5 mLs by mouth 4 (four) times daily as needed. 118 mL 0  . ranitidine (ZANTAC) 150 MG tablet Take 1 tablet (150 mg total) 2 (two) times daily by mouth. 60 tablet 5  . RESTASIS MULTIDOSE 0.05 % ophthalmic emulsion Place 1 drop into both eyes as needed.  3  . rizatriptan (MAXALT-MLT) 10 MG  disintegrating tablet Take 1 tablet (10 mg total) by mouth as needed for migraine. May repeat in 2 hours if needed 9 tablet 11   Facility-Administered Medications Prior to Visit  Medication Dose Route Frequency Provider  Last Rate Last Dose  . 0.9 %  sodium chloride infusion  500 mL Intravenous Continuous Pyrtle, Lajuan Lines, MD        Allergies  Allergen Reactions  . Acetaminophen Itching    Patient can take tylenol, just not tylenol#3  . Hydrocodone Nausea Only  . Oxycodone Itching  . Phosphate Itching    sick  . Codeine Itching and Nausea Only    Review of Systems  Constitutional: Negative for chills, fever and malaise/fatigue.  HENT: Negative for congestion and hearing loss.   Eyes: Negative for discharge.  Respiratory: Negative for cough, sputum production and shortness of breath.   Cardiovascular: Negative for chest pain, palpitations and leg swelling.  Gastrointestinal: Negative for abdominal pain, blood in stool, constipation, diarrhea, heartburn, nausea and vomiting.  Genitourinary: Negative for dysuria, frequency, hematuria and urgency.  Musculoskeletal: Negative for back pain, falls and myalgias.  Skin: Negative for rash.  Neurological: Positive for headaches. Negative for dizziness, sensory change, loss of consciousness and weakness.  Endo/Heme/Allergies: Negative for environmental allergies. Does not bruise/bleed easily.  Psychiatric/Behavioral: Negative for depression and suicidal ideas. The patient is not nervous/anxious and does not have insomnia.        Objective:    Physical Exam  Constitutional: She is oriented to person, place, and time. She appears well-developed and well-nourished.  HENT:  Head: Normocephalic and atraumatic.  Eyes: Conjunctivae and EOM are normal.  Neck: Normal range of motion. Neck supple. No JVD present. Carotid bruit is not present. No thyromegaly present.  Cardiovascular: Normal rate, regular rhythm and normal heart sounds.  No murmur  heard. Pulmonary/Chest: Effort normal and breath sounds normal. No respiratory distress. She has no wheezes. She has no rales. She exhibits no tenderness.  Musculoskeletal: She exhibits no edema.  Neurological: She is alert and oriented to person, place, and time.  Psychiatric: She has a normal mood and affect.  Nursing note and vitals reviewed.   BP 118/66 (BP Location: Left Arm, Cuff Size: Normal)   Pulse 87   Temp 98.4 F (36.9 C) (Oral)   Resp 16   Ht 5\' 3"  (1.6 m)   Wt 208 lb 3.2 oz (94.4 kg)   LMP 05/28/2017 (Exact Date)   SpO2 98%   BMI 36.88 kg/m  Wt Readings from Last 3 Encounters:  06/19/17 208 lb 3.2 oz (94.4 kg)  06/12/17 209 lb (94.8 kg)  04/25/17 211 lb (95.7 kg)   BP Readings from Last 3 Encounters:  06/19/17 118/66  06/12/17 100/66  04/25/17 130/82     Immunization History  Administered Date(s) Administered  . Influenza Split 03/28/2011  . Influenza Whole 05/14/2007  . Influenza,inj,Quad PF,6+ Mos 04/24/2013, 02/27/2014, 02/27/2017  . Td 07/08/2007    Health Maintenance  Topic Date Due  . PAP SMEAR  04/07/2017  . TETANUS/TDAP  07/07/2017  . INFLUENZA VACCINE  Completed  . HIV Screening  Completed    Lab Results  Component Value Date   WBC 9.7 03/30/2017   HGB 12.8 03/30/2017   HCT 39.4 03/30/2017   PLT 416 (H) 03/30/2017   GLUCOSE 89 02/27/2017   CHOL 199 05/14/2014   TRIG 61.0 05/14/2014   HDL 75.40 05/14/2014   LDLCALC 111 (H) 05/14/2014   ALT 15 02/27/2017   AST 14 02/27/2017   NA 137 02/27/2017   K 4.1 02/27/2017   CL 104 02/27/2017   CREATININE 0.91 02/27/2017   BUN 12 02/27/2017   CO2 26 02/27/2017   TSH 1.26 05/14/2014  HGBA1C 5.6 06/09/2015    Lab Results  Component Value Date   TSH 1.26 05/14/2014   Lab Results  Component Value Date   WBC 9.7 03/30/2017   HGB 12.8 03/30/2017   HCT 39.4 03/30/2017   MCV 87.9 03/30/2017   PLT 416 (H) 03/30/2017   Lab Results  Component Value Date   NA 137 02/27/2017   K 4.1  02/27/2017   CO2 26 02/27/2017   GLUCOSE 89 02/27/2017   BUN 12 02/27/2017   CREATININE 0.91 02/27/2017   BILITOT 0.2 02/27/2017   ALKPHOS 57 02/27/2017   AST 14 02/27/2017   ALT 15 02/27/2017   PROT 6.9 02/27/2017   ALBUMIN 3.7 02/27/2017   CALCIUM 8.7 02/27/2017   ANIONGAP 7 03/19/2015   GFR 84.74 02/27/2017   Lab Results  Component Value Date   CHOL 199 05/14/2014   Lab Results  Component Value Date   HDL 75.40 05/14/2014   Lab Results  Component Value Date   LDLCALC 111 (H) 05/14/2014   Lab Results  Component Value Date   TRIG 61.0 05/14/2014   Lab Results  Component Value Date   CHOLHDL 3 05/14/2014   Lab Results  Component Value Date   HGBA1C 5.6 06/09/2015         Assessment & Plan:   Problem List Items Addressed This Visit    None    Visit Diagnoses    Intractable episodic tension-type headache    -  Primary   Relevant Medications   ketorolac (TORADOL) injection 60 mg (Completed)   eletriptan (RELPAX) 20 MG tablet    f/u neuro   I am having Morgan Long start on eletriptan. I am also having her maintain her etonogestrel-ethinyl estradiol, multivitamin, ESTROGEL, LINZESS, Fluocinolone Acetonide, RESTASIS MULTIDOSE, ibuprofen, phentermine, ondansetron, levofloxacin, promethazine-dextromethorphan, beclomethasone, albuterol, cetirizine, ranitidine, budesonide-formoterol, azelastine, rizatriptan, butalbital-acetaminophen-caffeine, omeprazole, and AMBULATORY NON FORMULARY MEDICATION. We administered ketorolac. We will continue to administer sodium chloride.  Meds ordered this encounter  Medications  . ketorolac (TORADOL) injection 60 mg  . eletriptan (RELPAX) 20 MG tablet    Sig: Take 1 tablet (20 mg total) by mouth as needed for migraine or headache. May repeat in 2 hours if headache persists or recurs.    Dispense:  10 tablet    Refill:  0    CMA served as scribe during this visit. History, Physical and Plan performed by medical provider.  Documentation and orders reviewed and attested to.  [name], DO  [phone]

## 2017-06-20 DIAGNOSIS — D2239 Melanocytic nevi of other parts of face: Secondary | ICD-10-CM | POA: Diagnosis not present

## 2017-06-20 DIAGNOSIS — D485 Neoplasm of uncertain behavior of skin: Secondary | ICD-10-CM | POA: Diagnosis not present

## 2017-06-21 DIAGNOSIS — L570 Actinic keratosis: Secondary | ICD-10-CM | POA: Diagnosis not present

## 2017-06-28 ENCOUNTER — Telehealth: Payer: Self-pay | Admitting: Neurology

## 2017-06-28 ENCOUNTER — Encounter: Payer: Self-pay | Admitting: Neurology

## 2017-06-28 ENCOUNTER — Ambulatory Visit (INDEPENDENT_AMBULATORY_CARE_PROVIDER_SITE_OTHER): Payer: BLUE CROSS/BLUE SHIELD | Admitting: Neurology

## 2017-06-28 VITALS — BP 132/86 | HR 96

## 2017-06-28 DIAGNOSIS — G43719 Chronic migraine without aura, intractable, without status migrainosus: Secondary | ICD-10-CM

## 2017-06-28 MED ORDER — ONABOTULINUMTOXINA 100 UNITS IJ SOLR
200.0000 [IU] | Freq: Once | INTRAMUSCULAR | Status: AC
  Administered 2017-06-28: 200 [IU] via INTRAMUSCULAR

## 2017-06-28 NOTE — Telephone Encounter (Signed)
Please schedule 3 month BOTOX. Thank you.

## 2017-06-28 NOTE — Patient Instructions (Signed)
As discussed, botulinum toxin takes about 3-7 days to kick in. Please remember, this is not a pain shot, this is to gradually improve your symptoms. In some patients it takes up to 2-3 weeks to make a difference and it wears off with time. Sometimes it may wear off before it is time for the next injection. We still should wait till the next 3 monthly injection, because injecting too frequently may cause you to develop immunity to the botulinum toxin. We are looking for a reduction in your headache frequency and headache severity. Side effects to look out for are mouth dryness, dryness of the eyes, heaviness of your head or muscle weakness, rarely, speech or swallowing difficulties and very rarely breathing difficulties. Some people have transient neck pain or soreness which typically responds to over-the-counter anti-inflammatory medication and local heat application with a heat pad. If you think you have a severe reaction to the botulinum toxin, such as weakness, trouble speaking, trouble breathing, or trouble swallowing, you have to call 911 or have someone take you to the nearest emergency room. However, most people have no side effects from the injections. It is normal to have a little bit of redness and swelling around the injection sites which usually improves after a few hours. Rarely, there may be a bruise that improves on its own. Most side effects reported are very mild and resolve within 10-14 days. Please feel free to call us if you have any additional questions or concerns: [phone].

## 2017-06-28 NOTE — Progress Notes (Signed)
Subjective:    Patient ID: Morgan Long is a 47 y.o. female.  HPI   Morgan Long is a 47 year old right-handed woman with an underlying medical history of allergic rhinitis, endometriosis, TMJ problems, and neck pain, as well as obesity, who presents for initial botulinum toxin injection for her diagnosis of intractable migraine headaches. The patient is unaccompanied today. Of note, Morgan Long no showed for an appointment on 05/23/2017. Morgan Long was seen by her primary care physician recently and received a Toradol injection on 06/19/2017 as well as a prescription for Relpax.  I last saw her on 04/25/2017. Please refer to my note from that visit regarding details of her migraine history and medications should tried.   Written informed consent for recurrent, 3 monthly intramuscular injections with botulinum toxin for this indication has been obtained and will be scanned into the patient's electronic chart. I will re-consent if the type of botulinum toxin used or the indication for injection changes for this patient in the future. The patient is informed that we will use the same consent for her recurrent, most likely 3 monthly injections. Morgan Long demonstrated understanding and voiced agreement.  I talked to the patient in detail about expectations, limitations, benefits as well as potential adverse effects of botulinum toxin injections. The patient understands that the side effects include (but are not limited to): Mouth dryness, dryness of eyes, speech and swallowing difficulties, respiratory depression or problems breathing, weakness of muscles including more distant muscles than the ones injected, flu-like symptoms, myalgias, injection site reactions such as redness, itching, swelling, pain, and infection.  200 units of botulinum toxin type A were reconstituted using preservative-free normal saline to a concentration of 10 units per 0.1 mL and drawn up into 1 mL tuberculin syringes. Lot number was [id number],  expiration date July 2021 for both 100 unit vials, buy and bill.  The patient was situated in a chair, sitting comfortably. After preparing the areas with 70% isopropyl alcohol and using a 26 gauge 1 1/2 inch hollow lumen recording EMG needle for the neck injections as well as a 30 gauge 1 inch needle for the facial injections, a total dose of 155 units of botulinum toxin type A in the form of Botox was injected into the muscles and the following distribution and quantities:  #1: 10 units on the right and 10 units in the left frontalis muscles, broken down in 2 sites on each side. #2: 5 units in the right and 5 units in the left corrugator muscles. #3: 15 units in the right and 15 units in the left occipitalis muscles, broken down in 3 sites on each side. #4: 20 units in the right and 20 units in the left temporalis muscles, broken down in 4 sites on each side.  #5: 15 units on the right and 15 units in the left upper trapezius muscles, broken down in 3 sites on each side. #6: 10 units in the right and 10 units in the left splenius capitis muscles, broken down in 2 sites on each side.  #7: 2.5 units in the right and 2.5 units in the left procerus muscles.  EMG guidance was utilized for the neck injections with mild EMG activity noted, especially in the right splenius capitis muscle.   A dose of 45 units out of a total dose of 200 units was discarded as unavoidable waste.    The patient tolerated the procedure well without immediate complications. Morgan Long was advised to make a followup appointment for repeat  injections in 3 months from now and encouraged to call us with any interim questions, concerns, problems, or updates. Morgan Long was in agreement and did not have any questions prior to leaving clinic today.  Previously:   Morgan Long had come off of Zonegran and was started on nortriptyline by Dr. Melton Alar.    04/25/17: Dr. Melton Alar retired. Morgan Long reports a long-standing history of migraine headaches, Morgan Long recalls  Morgan Long was about 47 years old when Morgan Long started having migraines. For quite some time Morgan Long had spontaneous improvement in her migraines but some 4 or 5 years ago started having more frequent migraines. Morgan Long has tried and failed multiple preventative medications but does not have a list with her and prior records from Dr. Audery Amel office are not available today. Morgan Long reports that his office is closed at this point. Morgan Long did not get her records from his office. Perhaps her primary care provider has records. I encouraged her to try to obtain those records as it will help [name] manage her migraines optimally. Morgan Long recalls having tried Topamax. Morgan Long had hair loss from some medications but is not completely sure which ones. Topamax did not help, Zonegran caused her sedation, nortriptyline was also tried but Morgan Long is unsure what happened with it. Amitriptyline Morgan Long may have tried as well. Morgan Long did not try Depakote. Morgan Long is worried about sedation side effects as Morgan Long also is going to school and has to be alert throughout the day. Morgan Long takes occasional nausea medicine which likely is Zofran under the tongue. Morgan Long has tried Maxalt under the tongue which is helpful at times. Morgan Long needs a refill on that. Morgan Long has regular eye exams because of her history of dry eyes. Morgan Long used to wear a single contact but was advised to stop using it.  Morgan Long has had 2 rounds of Botox injections, last time about 3 months ago as Morgan Long recalls. Morgan Long remembers doing okay with it. Prior to starting Botox injections Morgan Long reports having about 18-20 headache days per month typically. Morgan Long does not always have a typical aura but sometimes has visual blurriness and bringing in her ears to warn her about an upcoming migraine headache. Morgan Long has occasional nausea that precedes the headache.   I saw her on 06/09/2015 at which time Morgan Long was referred is a new patient referral for a new problem, referred by her optometrist at the time for 1 month history of blurry vision. Her exam at the  time was nonfocal and reassuringly Morgan Long had no significant eye related findings. I suggested further workup in the form of visual evoked potentials, blood work, and Morgan Long also reported a 6 month history of feeling tired. Morgan Long had no one-sided weakness, tingling or numbness. Symptoms from the past which included paresthesias had resolved completely. Morgan Long was under the care of Dr. Melton Alar for migraines. Morgan Long was in the process of titrating Zonegran. Morgan Long was on 225 mg each night. When Morgan Long took 300 mg each night Morgan Long felt too sleepy during the day. Morgan Long denied any symptoms of sleep disordered breathing. Morgan Long did admit not being a good sleeper. Morgan Long did endorse stress as Morgan Long was in school online for psychology and was also working off-and-on in Personal assistant. Morgan Long reported not drinking sodas, not drinking alcohol and Morgan Long reported not smoking. Morgan Long did report pain with eye movements and dry eyes.  Morgan Long reported no family history of multiple sclerosis or lupus. Morgan Long denied joint pain with the exception of bilateral knee pain and Morgan Long also reported a 50 pound weight  gain in the last year. Morgan Long had visual evoked potentials on 07/06/2015: Impression: The visual evoked response test above was within normal limits bilaterally. No evidence of conduction slowing was seen within the anterior visual pathways on either side on today's evaluation.   We called her with her test results. Labs from 06/09/2015 showed normal A1c, normal vitamin D level, normal ANA, normal RF, normal ESR. CRP was elevated at 15.6. 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   Morgan Long had a brain MRI with and without contrast on 06/23/2015: IMPRESSION:  This is a normal MRI of the brain with and without contrast  In addition, personally reviewed the images through the PACS system. We called her with her test  results.   I first met her on 05/22/2014 at the request of her neurosurgeon, Dr. Kathyrn Sheriff, at which time the patient reported intermittent right arm numbness, particularly with neck position changes. I suggested blood work and EMG and nerve conduction testing of the right upper extremity. Her blood work showed elevated B12 and B6 levels, indicative of B vitamin supplementation. Hemoglobin A1c was 5.7. We called her with her test results. Morgan Long had EMG and nerve conduction testing on 06/02/2014: IMPRESSION:   Nerve conduction studies done on both upper extremities were unremarkable, without evidence of a neuropathy seen. EMG evaluation of the right upper extremity was unremarkable, without evidence of an overlying cervical radiculopathy. We called her with her test results. At the time, Morgan Long reported improved symptoms.   05/22/2014: Morgan Long has intermittent right arm numbness. Her symptoms have been going on for about 6 months. Morgan Long has not noted any permanent numbness and no issues elsewhere. Morgan Long does not have any significant weakness and sometimes feels weak when the numbness seems to come on. It goes away if Morgan Long changes positions or adjusts her neck position. Morgan Long has had right shoulder problems and pain in the right shoulder.   You saw her on 05/14/2014 for neck pain. Morgan Long had undergone physical therapy without improvement of her neck pain. Morgan Long had cervical epidural steroid injections which helped for about 24 hours as understand.   Morgan Long had a C-spine MRI without contrast on 01/07/2014: Mild cervical spondylosis as described above without significant disc protrusion, foraminal stenosis or central canal stenosis.    Blood work from 05/14/2014 was reviewed: Morgan Long had a BMP, CBC with differential, liver function panel, lipid panel and TSH all of which were fine with the exception of a borderline LDL of 111.

## 2017-07-03 NOTE — Telephone Encounter (Signed)
I called to schedule the patient but she did not answer. I left a VM asking her to call me back.  °

## 2017-07-03 NOTE — Telephone Encounter (Signed)
Pt returned Danielle's call. appt scheduled for 4/18 with Dr Rexene Alberts

## 2017-07-03 NOTE — Telephone Encounter (Signed)
Noted, thank you

## 2017-07-05 DIAGNOSIS — Z809 Family history of malignant neoplasm, unspecified: Secondary | ICD-10-CM | POA: Diagnosis not present

## 2017-07-10 ENCOUNTER — Encounter: Payer: Self-pay | Admitting: Internal Medicine

## 2017-07-12 ENCOUNTER — Encounter: Payer: Self-pay | Admitting: Family Medicine

## 2017-07-12 ENCOUNTER — Telehealth: Payer: Self-pay | Admitting: Neurology

## 2017-07-12 NOTE — Telephone Encounter (Signed)
Pt is calling stating that butalbital hasnt worked for her and also she is having her first terrible migraine since the botox. If needed contact at work at [phone] pt will be there till 6

## 2017-07-12 NOTE — Telephone Encounter (Signed)
I spoke to Dr. Rexene Alberts. She wanted me to remind pt that botox can take up to 3 weeks for full effect. She can also take one triptan and see if that helps, but pt should not mix different triptans. ( It appears that she is getting rizatriptan from Dr. Rexene Alberts and eletriptan from Dr. Carollee Herter.)  I called pt and explained this to her. She says that she did not get eletriptan from Dr. Carollee Herter. She does not have any rizatriptan either. I advised her that Dr. Rexene Alberts gave her 11 refills of rizatriptan in November, she just needs to call her pharmacy to pick this RX up. I advised her again that she should not mix triptans, don't take maxalt and relpax. She may take 1 tablet of one triptan and another 1 tablet of the same triptan 2 hours later if the migraine has not resolved. Pt verbalized understanding and will discuss with her pharmacy.

## 2017-07-13 ENCOUNTER — Encounter: Payer: Self-pay | Admitting: Family Medicine

## 2017-07-13 ENCOUNTER — Ambulatory Visit: Payer: BLUE CROSS/BLUE SHIELD | Admitting: Family Medicine

## 2017-07-13 DIAGNOSIS — G43709 Chronic migraine without aura, not intractable, without status migrainosus: Secondary | ICD-10-CM | POA: Diagnosis not present

## 2017-07-13 MED ORDER — KETOROLAC TROMETHAMINE 60 MG/2ML IM SOLN
60.0000 mg | Freq: Once | INTRAMUSCULAR | Status: AC
  Administered 2017-07-13: 60 mg via INTRAMUSCULAR

## 2017-07-13 NOTE — Patient Instructions (Signed)

## 2017-07-13 NOTE — Progress Notes (Signed)
Subjective:  I acted as a Education administrator for [employer]. Morgan Long, [place]   Patient ID: Morgan Long, female    DOB: 1971/02/10, 47 y.o.   MRN: [id number]  Chief Complaint  Patient presents with  . migrains    HPI  Patient is in today for recurrent migraines.  She is seeing neuro and will get 2 more botox injections over the next 2 months.   She was doing great until she sneezed a few days ago and it brought on a migraine.  The maxalt helped some but she would like a toradol shot .  No other symptoms    Patient Care Team: Carollee Herter, Alferd Apa, DO as PCP - General (Family Medicine) Lyndal Pulley, DO as Attending Physician (Family Medicine) Virgina Evener, OD as Referring Physician (Optometry) Carollee Herter, Alferd Apa, DO as Consulting Physician (Family Medicine) Leta Baptist, Earlean Polka, MD as Consulting Physician (Neurology)   Past Medical History:  Diagnosis Date  . Allergic rhinitis   . Chronic migraine    Dr. Catalina Gravel  . Dry eye syndrome of both lacrimal glands   . Endometriosis   . Gastroparesis   . GERD (gastroesophageal reflux disease)   . Headache(784.0)    occasional, dx w/ Migraines before, Topamax helps  . Localized edema   . Muscle weakness (generalized)   . Nuclear cataract of both eyes    Mild  . Pain in right ankle and joints of right foot   . Sinusitis   . TMJ pain dysfunction syndrome    occasional    Past Surgical History:  Procedure Laterality Date  . AUGMENTATION MAMMAPLASTY Bilateral 2006  . BREAST ENHANCEMENT SURGERY  2006  . COLONOSCOPY    . endrometroisis    . fallopian tube removed     Left  . UPPER GASTROINTESTINAL ENDOSCOPY    . wisdoim teeth extraction      Family History  Problem Relation Age of Onset  . Throat cancer Mother   . Cancer - Other Mother 55       throat - died 35 months  . Heart disease Father   . Breast cancer Maternal Aunt        breast  . Colon cancer Maternal Uncle 72       died 81  . Breast cancer Maternal Aunt        breast   . Pancreatic cancer Maternal Aunt   . Diabetes Unknown        grandmother  . Hypertension Maternal Aunt        several family members  . Throat cancer Maternal Uncle   . Asthma Unknown        cousin, maternal  . Breast cancer Maternal Aunt        total of 5 aunts  . Breast cancer Maternal Aunt   . Colon cancer Paternal Grandfather   . Heart attack Neg Hx   . Rectal cancer Neg Hx   . Stomach cancer Neg Hx     Social History   Socioeconomic History  . Marital status: Single    Spouse name: Not on file  . Number of children: 0  . Years of education: BS  . Highest education level: Not on file  Social Needs  . Financial resource strain: Not on file  . Food insecurity - worry: Not on file  . Food insecurity - inability: Not on file  . Transportation needs - medical: Not on file  . Transportation needs - non-medical: Not  on file  Occupational History  . Occupation: Paramedic, going to school    Employer: [employer]  . Occupation: student-- clinical psychology  Tobacco Use  . Smoking status: Never Smoker  . Smokeless tobacco: Never Used  Substance and Sexual Activity  . Alcohol use: Yes    Alcohol/week: 0.0 oz    Comment: socially - occasional   . Drug use: No  . Sexual activity: Yes    Birth control/protection: Inserts    Comment: nuvaring  Other Topics Concern  . Not on file  Social History Narrative   Household:sister and her 3 kids    Drinks occasional starbucks drink        Outpatient Medications Prior to Visit  Medication Sig Dispense Refill  . AMBULATORY NON FORMULARY MEDICATION Medication Name: Domperidone 10 mg tablets Take one po before meals and at bedtime. May increase to 20 mg before meals when needed (max 7 tablets daily) 630 capsule 2  . azelastine (ASTELIN) 0.1 % nasal spray Place 1-2 sprays 2 (two) times daily into both nostrils. 30 mL 5  . beclomethasone (QVAR REDIHALER) 80 MCG/ACT inhaler Inhale 2 puffs 2 (two) times daily into the  lungs. 10.6 g 5  . budesonide-formoterol (SYMBICORT) 160-4.5 MCG/ACT inhaler Inhale 2 puffs 2 (two) times daily into the lungs. 1 Inhaler 5  . cetirizine (ZYRTEC) 10 MG tablet Take 1 tablet (10 mg total) daily by mouth. 30 tablet 5  . ESTROGEL 0.75 MG/1.25 GM (0.06%) topical gel Apply 1 application topically See admin instructions.  0  . etonogestrel-ethinyl estradiol (NUVARING) 0.12-0.015 MG/24HR vaginal ring Place 1 each vaginally every 28 (twenty-eight) days. Insert vaginally and leave in place for 3 consecutive weeks, then remove for 1 week.     . Fluocinolone Acetonide 0.01 % OIL Place 1 drop into both ears as needed.  1  . ibuprofen (ADVIL,MOTRIN) 800 MG tablet Take 800 mg by mouth every 6 (six) hours as needed. for pain  3  . LINZESS 72 MCG capsule TAKE 1 CAPSULE (72 MCG TOTAL) BY MOUTH DAILY BEFORE BREAKFAST. 30 capsule 8  . Multiple Vitamin (MULTIVITAMIN) LIQD Take 5 mLs by mouth daily.    Marland Kitchen omeprazole (PRILOSEC) 40 MG capsule Take 1 capsule (40 mg total) by mouth 2 (two) times daily. 60 capsule 3  . ondansetron (ZOFRAN) 4 MG tablet Take 1 tablet (4 mg total) by mouth as needed for nausea or vomiting (may take 8 mg before each half of prep). 4 tablet 0  . ranitidine (ZANTAC) 150 MG tablet Take 1 tablet (150 mg total) 2 (two) times daily by mouth. 60 tablet 5  . RESTASIS MULTIDOSE 0.05 % ophthalmic emulsion Place 1 drop into both eyes as needed.  3  . rizatriptan (MAXALT-MLT) 10 MG disintegrating tablet Take 1 tablet (10 mg total) by mouth as needed for migraine. May repeat in 2 hours if needed 9 tablet 11  . eletriptan (RELPAX) 20 MG tablet Take 1 tablet (20 mg total) by mouth as needed for migraine or headache. May repeat in 2 hours if headache persists or recurs. 10 tablet 0   Facility-Administered Medications Prior to Visit  Medication Dose Route Frequency Provider Last Rate Last Dose  . 0.9 %  sodium chloride infusion  500 mL Intravenous Continuous Pyrtle, Lajuan Lines, MD        Allergies   Allergen Reactions  . Acetaminophen Itching    Patient can take tylenol, just not tylenol#3  . Hydrocodone Nausea Only  .  Oxycodone Itching  . Phosphate Itching    sick  . Codeine Itching and Nausea Only    Review of Systems  Constitutional: Negative for chills, fever and malaise/fatigue.  HENT: Negative for congestion and hearing loss.   Eyes: Negative for discharge.  Respiratory: Negative for cough, sputum production and shortness of breath.   Cardiovascular: Negative for chest pain, palpitations and leg swelling.  Gastrointestinal: Negative for abdominal pain, blood in stool, constipation, diarrhea, heartburn, nausea and vomiting.  Genitourinary: Negative for dysuria, frequency, hematuria and urgency.  Musculoskeletal: Negative for back pain, falls and myalgias.  Skin: Negative for rash.  Neurological: Positive for headaches. Negative for dizziness, sensory change, loss of consciousness and weakness.  Endo/Heme/Allergies: Negative for environmental allergies. Does not bruise/bleed easily.  Psychiatric/Behavioral: Negative for depression and suicidal ideas. The patient is not nervous/anxious and does not have insomnia.        Objective:    Physical Exam  Constitutional: She is oriented to person, place, and time. She appears well-developed and well-nourished.  HENT:  Head: Normocephalic and atraumatic.  Eyes: Conjunctivae and EOM are normal.  Neck: Normal range of motion. Neck supple. No JVD present. Carotid bruit is not present. No thyromegaly present.  Cardiovascular: Normal rate, regular rhythm and normal heart sounds.  No murmur heard. Pulmonary/Chest: Effort normal and breath sounds normal. No respiratory distress. She has no wheezes. She has no rales. She exhibits no tenderness.  Musculoskeletal: She exhibits no edema.  Neurological: She is alert and oriented to person, place, and time.  Psychiatric: She has a normal mood and affect.  Nursing note and vitals  reviewed.   BP 126/66 (BP Location: Left Arm, Patient Position: Sitting, Cuff Size: Large)   Pulse 96   Temp 98.7 F (37.1 C) (Oral)   Resp 16   Ht 5' 2.99" (1.6 m)   Wt 206 lb 9.6 oz (93.7 kg)   SpO2 97%   BMI 36.61 kg/m  Wt Readings from Last 3 Encounters:  07/13/17 206 lb 9.6 oz (93.7 kg)  06/19/17 208 lb 3.2 oz (94.4 kg)  06/12/17 209 lb (94.8 kg)   BP Readings from Last 3 Encounters:  07/13/17 126/66  06/28/17 132/86  06/19/17 118/66     Immunization History  Administered Date(s) Administered  . Influenza Split 03/28/2011  . Influenza Whole 05/14/2007  . Influenza,inj,Quad PF,6+ Mos 04/24/2013, 02/27/2014, 02/27/2017  . Td 07/08/2007    Health Maintenance  Topic Date Due  . TETANUS/TDAP  07/07/2017  . PAP SMEAR  05/07/2020  . INFLUENZA VACCINE  Completed  . HIV Screening  Completed    Lab Results  Component Value Date   WBC 9.7 03/30/2017   HGB 12.8 03/30/2017   HCT 39.4 03/30/2017   PLT 416 (H) 03/30/2017   GLUCOSE 89 02/27/2017   CHOL 199 05/14/2014   TRIG 61.0 05/14/2014   HDL 75.40 05/14/2014   LDLCALC 111 (H) 05/14/2014   ALT 15 02/27/2017   AST 14 02/27/2017   NA 137 02/27/2017   K 4.1 02/27/2017   CL 104 02/27/2017   CREATININE 0.91 02/27/2017   BUN 12 02/27/2017   CO2 26 02/27/2017   TSH 1.26 05/14/2014   HGBA1C 5.6 06/09/2015    Lab Results  Component Value Date   TSH 1.26 05/14/2014   Lab Results  Component Value Date   WBC 9.7 03/30/2017   HGB 12.8 03/30/2017   HCT 39.4 03/30/2017   MCV 87.9 03/30/2017   PLT 416 (H) 03/30/2017  Lab Results  Component Value Date   NA 137 02/27/2017   K 4.1 02/27/2017   CO2 26 02/27/2017   GLUCOSE 89 02/27/2017   BUN 12 02/27/2017   CREATININE 0.91 02/27/2017   BILITOT 0.2 02/27/2017   ALKPHOS 57 02/27/2017   AST 14 02/27/2017   ALT 15 02/27/2017   PROT 6.9 02/27/2017   ALBUMIN 3.7 02/27/2017   CALCIUM 8.7 02/27/2017   ANIONGAP 7 03/19/2015   GFR 84.74 02/27/2017   Lab Results    Component Value Date   CHOL 199 05/14/2014   Lab Results  Component Value Date   HDL 75.40 05/14/2014   Lab Results  Component Value Date   LDLCALC 111 (H) 05/14/2014   Lab Results  Component Value Date   TRIG 61.0 05/14/2014   Lab Results  Component Value Date   CHOLHDL 3 05/14/2014   Lab Results  Component Value Date   HGBA1C 5.6 06/09/2015         Assessment & Plan:   Problem List Items Addressed This Visit      Unprioritized   Chronic migraine 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 Belles's eletriptan. I am also having her maintain her etonogestrel-ethinyl estradiol, multivitamin, ESTROGEL, LINZESS, Fluocinolone Acetonide, RESTASIS MULTIDOSE, ibuprofen, ondansetron, beclomethasone, cetirizine, ranitidine, budesonide-formoterol, azelastine, rizatriptan, omeprazole, and AMBULATORY NON FORMULARY MEDICATION. We administered ketorolac. We will continue to administer sodium chloride.  Meds ordered this encounter  Medications  . ketorolac (TORADOL) injection 60 mg    CMA served as scribe during this visit. History, Physical and Plan performed by medical provider. Documentation and orders reviewed and attested to.  [name], DO

## 2017-07-13 NOTE — Assessment & Plan Note (Signed)
toradol 60 mg  F/u neuro  con't maxalt per neuro and botox

## 2017-07-16 NOTE — Telephone Encounter (Signed)
Please advise 

## 2017-07-16 NOTE — Telephone Encounter (Signed)
She needs to call her neuro--- she got a toradol shot last week --- Im concerned it is causing rebound headache Also is there something in her diet or lack of sleep that is triggering it

## 2017-07-20 DIAGNOSIS — H16223 Keratoconjunctivitis sicca, not specified as Sjogren's, bilateral: Secondary | ICD-10-CM | POA: Diagnosis not present

## 2017-07-20 DIAGNOSIS — H04123 Dry eye syndrome of bilateral lacrimal glands: Secondary | ICD-10-CM | POA: Diagnosis not present

## 2017-07-20 DIAGNOSIS — H43393 Other vitreous opacities, bilateral: Secondary | ICD-10-CM | POA: Diagnosis not present

## 2017-07-20 DIAGNOSIS — H2513 Age-related nuclear cataract, bilateral: Secondary | ICD-10-CM | POA: Diagnosis not present

## 2017-08-09 ENCOUNTER — Encounter (INDEPENDENT_AMBULATORY_CARE_PROVIDER_SITE_OTHER): Payer: BLUE CROSS/BLUE SHIELD

## 2017-08-14 ENCOUNTER — Encounter (INDEPENDENT_AMBULATORY_CARE_PROVIDER_SITE_OTHER): Payer: Self-pay | Admitting: Family Medicine

## 2017-08-14 ENCOUNTER — Ambulatory Visit (INDEPENDENT_AMBULATORY_CARE_PROVIDER_SITE_OTHER): Payer: BLUE CROSS/BLUE SHIELD | Admitting: Family Medicine

## 2017-08-14 VITALS — BP 117/77 | HR 75 | Temp 98.1°F | Ht 63.0 in | Wt 200.0 lb

## 2017-08-14 DIAGNOSIS — Z6835 Body mass index (BMI) 35.0-35.9, adult: Secondary | ICD-10-CM | POA: Diagnosis not present

## 2017-08-14 DIAGNOSIS — R0600 Dyspnea, unspecified: Secondary | ICD-10-CM | POA: Insufficient documentation

## 2017-08-14 DIAGNOSIS — R5383 Other fatigue: Secondary | ICD-10-CM | POA: Insufficient documentation

## 2017-08-14 DIAGNOSIS — Z0289 Encounter for other administrative examinations: Secondary | ICD-10-CM

## 2017-08-14 DIAGNOSIS — Z1331 Encounter for screening for depression: Secondary | ICD-10-CM | POA: Diagnosis not present

## 2017-08-14 DIAGNOSIS — R0602 Shortness of breath: Secondary | ICD-10-CM

## 2017-08-14 DIAGNOSIS — G4709 Other insomnia: Secondary | ICD-10-CM | POA: Diagnosis not present

## 2017-08-14 DIAGNOSIS — Z9189 Other specified personal risk factors, not elsewhere classified: Secondary | ICD-10-CM

## 2017-08-14 NOTE — Progress Notes (Signed)
.  Office: [phone]  /  Fax: [phone]   HPI:   Chief Complaint: OBESITY  Morgan Long (MR# [id number]) is a 47 y.o. female who presents on 08/14/2017 for obesity evaluation and treatment. Current BMI is Body mass index is 35.43 kg/m.Morgan Long has struggled with obesity for years and has been unsuccessful in either losing weight or maintaining long term weight loss. Morgan Long has never tried dieting in the past. Morgan Long has poor sleep secondary to staying up late studying. Morgan Long attended our information session and states she is currently in the action stage of change and ready to dedicate time achieving and maintaining a healthier weight.  Morgan Long states her family eats meals together she thinks her family will eat healthier with  her her desired weight loss is 60 lbs she started gaining weight not long after the death of her mother and aunts her heaviest weight ever was 208 lbs. she is a picky eater and doesn't like to eat healthier foods  she has significant food cravings issues  she snacks frequently in the evenings she skips meals frequently she is frequently drinking liquids with calories she frequently eats larger portions than normal  she has binge eating behaviors she struggles with emotional eating    Fatigue Morgan Long feels her energy is lower than it should be. This has worsened with weight gain and has not worsened recently. Morgan Long admits to daytime somnolence and  admits to waking up still tired. Patient is at risk for obstructive sleep apnea. Patent has a history of symptoms of daytime fatigue, morning fatigue and morning headache. Patient generally gets 5 or 6 hours of sleep per night, and states they generally have restless sleep. Snoring is present. Apneic episodes are not present. Epworth Sleepiness Score is 2  EKG was ordered today and is unchanged from EKG in 2017 (slightly different from January 2019, possible lead placement).  Dyspnea on exertion Morgan Long notes increasing shortness  of breath with exercising and seems to be worsening over time with weight gain. She notes getting out of breath sooner with activity than she used to. This has not gotten worse recently. EKG was ordered today and is unchanged from EKG in 2017 (slightly different from January 2019, possible lead placement).  Morgan Long denies orthopnea.  .Insomnia Morgan Long has poor sleep secondary to staying up late studying. We discussed sleep hygiene.   At risk for cardiovascular disease Morgan Long is at a higher than average risk for cardiovascular disease due to obesity. She currently denies any chest pain.  Depression Screen Morgan Long's Food and Mood (modified PHQ-9) score was  Depression screen PHQ 2/9 08/14/2017  Decreased Interest 3  Down, Depressed, Hopeless 3  PHQ - 2 Score 6  Altered sleeping 3  Tired, decreased energy 3  Change in appetite 2  Feeling bad or failure about yourself  2  Trouble concentrating 1  Moving slowly or fidgety/restless 1  Suicidal thoughts 0  PHQ-9 Score 18  Difficult doing work/chores Very difficult  Some recent data might be hidden    ALLERGIES: Allergies  Allergen Reactions  . Acetaminophen Itching    Patient can take tylenol, just not tylenol#3  . Hydrocodone Nausea Only  . Oxycodone Itching  . Phosphate Itching    sick  . Codeine Itching and Nausea Only    MEDICATIONS: Current Outpatient Medications on File Prior to Visit  Medication Sig Dispense Refill  . beclomethasone (QVAR REDIHALER) 80 MCG/ACT inhaler Inhale 2 puffs 2 (two) times daily into the lungs. 10.6  g 5  . budesonide-formoterol (SYMBICORT) 160-4.5 MCG/ACT inhaler Inhale 2 puffs 2 (two) times daily into the lungs. 1 Inhaler 5  . cetirizine (ZYRTEC) 10 MG tablet Take 1 tablet (10 mg total) daily by mouth. 30 tablet 5  . ESTROGEL 0.75 MG/1.25 GM (0.06%) topical gel Apply 1 application topically See admin instructions.  0  . etonogestrel-ethinyl estradiol (NUVARING) 0.12-0.015 MG/24HR vaginal ring Place 1 each  vaginally every 28 (twenty-eight) days. Insert vaginally and leave in place for 3 consecutive weeks, then remove for 1 week.     . Fluocinolone Acetonide 0.01 % OIL Place 1 drop into both ears as needed.  1  . ibuprofen (ADVIL,MOTRIN) 800 MG tablet Take 800 mg by mouth every 6 (six) hours as needed. for pain  3  . LINZESS 72 MCG capsule TAKE 1 CAPSULE (72 MCG TOTAL) BY MOUTH DAILY BEFORE BREAKFAST. 30 capsule 8  . Multiple Vitamin (MULTIVITAMIN) LIQD Take 5 mLs by mouth daily.    Marland Kitchen omeprazole (PRILOSEC) 40 MG capsule Take 1 capsule (40 mg total) by mouth 2 (two) times daily. 60 capsule 3  . ondansetron (ZOFRAN) 4 MG tablet Take 1 tablet (4 mg total) by mouth as needed for nausea or vomiting (may take 8 mg before each half of prep). 4 tablet 0  . ranitidine (ZANTAC) 150 MG tablet Take 1 tablet (150 mg total) 2 (two) times daily by mouth. 60 tablet 5  . RESTASIS MULTIDOSE 0.05 % ophthalmic emulsion Place 1 drop into both eyes as needed.  3  . rizatriptan (MAXALT-MLT) 10 MG disintegrating tablet Take 1 tablet (10 mg total) by mouth as needed for migraine. May repeat in 2 hours if needed 9 tablet 11   Current Facility-Administered Medications on File Prior to Visit  Medication Dose Route Frequency Provider Last Rate Last Dose  . 0.9 %  sodium chloride infusion  500 mL Intravenous Continuous Pyrtle, Lajuan Lines, MD        PAST MEDICAL HISTORY: Past Medical History:  Diagnosis Date  . Allergic rhinitis   . Asthma   . Chronic migraine    Dr. Catalina Gravel  . Constipation   . Dry eye syndrome of both lacrimal glands   . Dry skin   . Endometriosis   . Fatigue   . Gastroparesis   . GERD (gastroesophageal reflux disease)   . Headache(784.0)    occasional, dx w/ Migraines before, Topamax helps  . Heartburn   . History of stomach ulcers   . Lactose intolerance   . Localized edema   . Migraine   . Muscle weakness (generalized)   . Nuclear cataract of both eyes    Mild  . Overweight   . Pain in right  ankle and joints of right foot   . Shortness of breath   . Sinus complaint   . Sinusitis   . Tired   . TMJ pain dysfunction syndrome    occasional    PAST SURGICAL HISTORY: Past Surgical History:  Procedure Laterality Date  . AUGMENTATION MAMMAPLASTY Bilateral 2006  . BREAST ENHANCEMENT SURGERY  2006  . BUNIONECTOMY    . COLONOSCOPY    . endrometroisis    . fallopian tube removed     Left  . UPPER GASTROINTESTINAL ENDOSCOPY    . wisdoim teeth extraction      SOCIAL HISTORY: Social History   Tobacco Use  . Smoking status: Never Smoker  . Smokeless tobacco: Never Used  Substance Use Topics  . Alcohol use: Yes  Alcohol/week: 0.0 oz    Comment: socially - occasional   . Drug use: No    FAMILY HISTORY: Family History  Problem Relation Age of Onset  . Throat cancer Mother   . Cancer - Other Mother 55       throat - died 36 months  . Cancer Mother   . Alcoholism Mother   . Heart disease Father   . Cancer Father   . Breast cancer Maternal Aunt        breast  . Colon cancer Maternal Uncle 73       died 49  . Breast cancer Maternal Aunt        breast  . Pancreatic cancer Maternal Aunt   . Diabetes Unknown        grandmother  . Hypertension Maternal Aunt        several family members  . Throat cancer Maternal Uncle   . Asthma Unknown        cousin, maternal  . Breast cancer Maternal Aunt        total of 5 aunts  . Breast cancer Maternal Aunt   . Colon cancer Paternal Grandfather   . Heart attack Neg Hx   . Rectal cancer Neg Hx   . Stomach cancer Neg Hx     ROS: Review of Systems  Constitutional: Positive for malaise/fatigue.  HENT: Positive for sinus pain.   Eyes:       Vision Changes  Respiratory: Positive for shortness of breath.   Cardiovascular: Negative for chest pain and orthopnea.       Shortness of Breath on exertion  Gastrointestinal: Positive for constipation.  Skin:       Dryness   Neurological: Positive for headaches.    Psychiatric/Behavioral: The patient has insomnia.     PHYSICAL EXAM: Blood pressure 117/77, pulse 75, temperature 98.1 F (36.7 C), temperature source Oral, height 5\' 3"  (1.6 m), weight 200 lb (90.7 kg), SpO2 100 %. Body mass index is 35.43 kg/m. Physical Exam  Constitutional: She is oriented to person, place, and time. She appears well-developed and well-nourished.  HENT:  Head: Normocephalic and atraumatic.  Nose: Nose normal.  Eyes: EOM are normal. No scleral icterus.  Neck: Normal range of motion. Neck supple. No thyromegaly present.  Cardiovascular: Normal rate and regular rhythm.  Pulmonary/Chest: Effort normal. No respiratory distress.  Abdominal: Soft. There is no tenderness.  +obesity  Musculoskeletal: Normal range of motion.  Range of Motion normal in all 4 extremities  Neurological: She is alert and oriented to person, place, and time. Coordination normal.  Skin: Skin is warm and dry.  Psychiatric: She has a normal mood and affect. Her behavior is normal.  Vitals reviewed.   RECENT LABS AND TESTS: BMET    Component Value Date/Time   NA 137 02/27/2017 1552   K 4.1 02/27/2017 1552   CL 104 02/27/2017 1552   CO2 26 02/27/2017 1552   GLUCOSE 89 02/27/2017 1552   BUN 12 02/27/2017 1552   CREATININE 0.91 02/27/2017 1552   CALCIUM 8.7 02/27/2017 1552   GFRNONAA >60 03/19/2015 2245   GFRAA >60 03/19/2015 2245   Lab Results  Component Value Date   HGBA1C 5.6 06/09/2015   No results found for: INSULIN CBC    Component Value Date/Time   WBC 9.7 03/30/2017 1512   RBC 4.48 03/30/2017 1512   HGB 12.8 03/30/2017 1512   HCT 39.4 03/30/2017 1512   PLT 416 (H) 03/30/2017 1512  MCV 87.9 03/30/2017 1512   MCH 28.6 03/30/2017 1512   MCHC 32.5 03/30/2017 1512   RDW 12.5 03/30/2017 1512   LYMPHSABS 2,483 03/30/2017 1512   MONOABS 0.7 02/27/2017 1552   EOSABS 97 03/30/2017 1512   BASOSABS 49 03/30/2017 1512   Iron/TIBC/Ferritin/ %Sat No results found for: IRON,  TIBC, FERRITIN, IRONPCTSAT Lipid Panel     Component Value Date/Time   CHOL 199 05/14/2014 1223   TRIG 61.0 05/14/2014 1223   HDL 75.40 05/14/2014 1223   CHOLHDL 3 05/14/2014 1223   VLDL 12.2 05/14/2014 1223   LDLCALC 111 (H) 05/14/2014 1223   Hepatic Function Panel     Component Value Date/Time   PROT 6.9 02/27/2017 1552   ALBUMIN 3.7 02/27/2017 1552   AST 14 02/27/2017 1552   ALT 15 02/27/2017 1552   ALKPHOS 57 02/27/2017 1552   BILITOT 0.2 02/27/2017 1552   BILIDIR 0.0 05/14/2014 1223      Component Value Date/Time   TSH 1.26 05/14/2014 1223   Vitamin D  Ref. Range 06/09/2015 10:58  Vitamin D, 25-Hydroxy Latest Ref Range: 30.0 - 100.0 ng/mL 34.3    ECG  shows NSR with a rate of 74 BPM INDIRECT CALORIMETER done today shows a VO2 of 255 and a REE of 1772. Her calculated basal metabolic rate is 1540 thus her basal metabolic rate is better than expected.    ASSESSMENT AND PLAN: Other fatigue - Plan: EKG 12-Lead, Vitamin B12, CBC With Differential, Comprehensive metabolic panel, Folate, Hemoglobin A1c, Insulin, random, Lipid Panel With LDL/HDL Ratio, T3, T4, free, TSH, VITAMIN D 25 Hydroxy (Vit-D Deficiency, Fractures)  Shortness of breath on exertion - Plan: CBC With Differential  Other insomnia  At risk for heart disease  Depression screening  Class 2 severe obesity with serious comorbidity and body mass index (BMI) of 35.0 to 35.9 in adult, unspecified obesity type (HCC)  PLAN:  Fatigue Syann was informed that her fatigue may be related to obesity, depression or many other causes. Labs will be ordered, and in the meanwhile Lina has agreed to work on diet, exercise and weight loss to help with fatigue. Proper sleep hygiene was discussed including the need for 7-8 hours of quality sleep each night. A sleep study was not ordered based on symptoms and Epworth score. We will order indirect calorimetry.  Dyspnea on exertion Abigal's shortness of breath appears to be  obesity related and exercise induced. She has agreed to work on weight loss and gradually increase exercise to treat her exercise induced shortness of breath. If Perry follows our instructions and loses weight without improvement of her shortness of breath, we will plan to refer to pulmonology. We will order indirect calorimetry and labs. We will monitor this condition regularly. Teigen agrees to this plan.  Insomnia Janese agrees to sleep hygiene implementation and if still poor sleep, we may need to refer to a sleep specialist. Aracelli agrees to follow up with our clinic in 2 weeks.  Cardiovascular risk counseling Vana was given extended (15 minutes) coronary artery disease prevention counseling today. She is 47 y.o. female and has risk factors for heart disease including obesity. We discussed intensive lifestyle modifications today with an emphasis on specific weight loss instructions and strategies. Pt was also informed of the importance of increasing exercise and decreasing saturated fats to help prevent heart disease.  Depression Screen Elena had a strongly positive depression screening. Depression is commonly associated with obesity and often results in emotional eating behaviors. We will  monitor this closely and work on CBT to help improve the non-hunger eating patterns. Referral to Psychology may be required if no improvement is seen as she continues in our clinic.  Obesity Londin is currently in the action stage of change and her goal is to continue with weight loss efforts She has agreed to follow the Category 3 plan Meeah has been instructed to work up to a goal of 150 minutes of combined cardio and strengthening exercise per week for weight loss and overall health benefits. We discussed the following Behavioral Modification Strategies today: increasing lean protein intake, increase H2O intake, no skipping meals and planning for success.  Kirah has agreed to follow up with our clinic in 2 weeks. She  was informed of the importance of frequent follow up visits to maximize her success with intensive lifestyle modifications for her multiple health conditions. She was informed we would discuss her lab results at her next visit unless there is a critical issue that needs to be addressed sooner. Tristy agreed to keep her next visit at the agreed upon time to discuss these results.    OBESITY BEHAVIORAL INTERVENTION VISIT  Today's visit was # 1 out of 22.  Starting weight: 200 lbs Starting date: 08/14/17 Today's weight : 200 lbs  Today's date: 08/14/2017 Total lbs lost to date: 0 (Patients must lose 7 lbs in the first 6 months to continue with counseling)   ASK: We discussed the diagnosis of obesity with Calvert today and Virdia agreed to give [name] permission to discuss obesity behavioral modification therapy today.  ASSESS: Tashima has the diagnosis of obesity and her BMI today is 35.44 Mena is in the action stage of change   ADVISE: Blimi was educated on the multiple health risks of obesity as well as the benefit of weight loss to improve her health. She was advised of the need for long term treatment and the importance of lifestyle modifications.  AGREE: Multiple dietary modification options and treatment options were discussed and  Heydi agreed to the above obesity treatment plan.   I, Doreene Nest, am acting as transcriptionist for Eber Jones, MD    I have reviewed the above documentation for accuracy and completeness, and I agree with the above. - Ilene Qua, MD

## 2017-08-15 LAB — CBC WITH DIFFERENTIAL
Basophils Absolute: 0 10*3/uL (ref 0.0–0.2)
Basos: 0 %
EOS (ABSOLUTE): 0.1 10*3/uL (ref 0.0–0.4)
EOS: 1 %
HEMATOCRIT: 38.6 % (ref 34.0–46.6)
HEMOGLOBIN: 12.3 g/dL (ref 11.1–15.9)
Immature Grans (Abs): 0 10*3/uL (ref 0.0–0.1)
Immature Granulocytes: 0 %
LYMPHS ABS: 2.3 10*3/uL (ref 0.7–3.1)
Lymphs: 26 %
MCH: 28.9 pg (ref 26.6–33.0)
MCHC: 31.9 g/dL (ref 31.5–35.7)
MCV: 91 fL (ref 79–97)
MONOCYTES: 6 %
Monocytes Absolute: 0.5 10*3/uL (ref 0.1–0.9)
NEUTROS ABS: 6.1 10*3/uL (ref 1.4–7.0)
Neutrophils: 67 %
RBC: 4.26 x10E6/uL (ref 3.77–5.28)
RDW: 13.7 % (ref 12.3–15.4)
WBC: 9 10*3/uL (ref 3.4–10.8)

## 2017-08-15 LAB — COMPREHENSIVE METABOLIC PANEL
A/G RATIO: 1.5 (ref 1.2–2.2)
ALBUMIN: 4.2 g/dL (ref 3.5–5.5)
ALK PHOS: 73 IU/L (ref 39–117)
ALT: 18 IU/L (ref 0–32)
AST: 17 IU/L (ref 0–40)
BILIRUBIN TOTAL: 0.2 mg/dL (ref 0.0–1.2)
BUN / CREAT RATIO: 17 (ref 9–23)
BUN: 13 mg/dL (ref 6–24)
CHLORIDE: 102 mmol/L (ref 96–106)
CO2: 22 mmol/L (ref 20–29)
CREATININE: 0.76 mg/dL (ref 0.57–1.00)
Calcium: 9.1 mg/dL (ref 8.7–10.2)
GFR calc Af Amer: 107 mL/min/{1.73_m2} (ref >59)
GFR calc non Af Amer: 93 mL/min/{1.73_m2} (ref >59)
GLOBULIN, TOTAL: 2.8 g/dL (ref 1.5–4.5)
Glucose: 86 mg/dL (ref 65–99)
POTASSIUM: 4.1 mmol/L (ref 3.5–5.2)
SODIUM: 138 mmol/L (ref 134–144)
Total Protein: 7 g/dL (ref 6.0–8.5)

## 2017-08-15 LAB — T3: T3, Total: 166 ng/dL (ref 71–180)

## 2017-08-15 LAB — LIPID PANEL WITH LDL/HDL RATIO
CHOLESTEROL TOTAL: 188 mg/dL (ref 100–199)
HDL: 71 mg/dL (ref >39)
LDL CALC: 99 mg/dL (ref 0–99)
LDl/HDL Ratio: 1.4 ratio (ref 0.0–3.2)
Triglycerides: 92 mg/dL (ref 0–149)
VLDL CHOLESTEROL CAL: 18 mg/dL (ref 5–40)

## 2017-08-15 LAB — FOLATE: FOLATE: 10.5 ng/mL (ref >3.0)

## 2017-08-15 LAB — HEMOGLOBIN A1C
Est. average glucose Bld gHb Est-mCnc: 111 mg/dL
Hgb A1c MFr Bld: 5.5 % (ref 4.8–5.6)

## 2017-08-15 LAB — VITAMIN B12: Vitamin B-12: 610 pg/mL (ref 232–1245)

## 2017-08-15 LAB — VITAMIN D 25 HYDROXY (VIT D DEFICIENCY, FRACTURES): Vit D, 25-Hydroxy: 26.9 ng/mL — ABNORMAL LOW (ref 30.0–100.0)

## 2017-08-15 LAB — TSH: TSH: 0.976 u[IU]/mL (ref 0.450–4.500)

## 2017-08-15 LAB — T4, FREE: FREE T4: 1.04 ng/dL (ref 0.82–1.77)

## 2017-08-15 LAB — INSULIN, RANDOM: INSULIN: 8 u[IU]/mL (ref 2.6–24.9)

## 2017-08-28 ENCOUNTER — Encounter (INDEPENDENT_AMBULATORY_CARE_PROVIDER_SITE_OTHER): Payer: Self-pay

## 2017-08-28 ENCOUNTER — Ambulatory Visit (INDEPENDENT_AMBULATORY_CARE_PROVIDER_SITE_OTHER): Payer: BLUE CROSS/BLUE SHIELD | Admitting: Family Medicine

## 2017-08-29 DIAGNOSIS — R609 Edema, unspecified: Secondary | ICD-10-CM | POA: Diagnosis not present

## 2017-08-29 DIAGNOSIS — M65872 Other synovitis and tenosynovitis, left ankle and foot: Secondary | ICD-10-CM | POA: Diagnosis not present

## 2017-08-29 DIAGNOSIS — M25572 Pain in left ankle and joints of left foot: Secondary | ICD-10-CM | POA: Diagnosis not present

## 2017-08-29 DIAGNOSIS — M659 Synovitis and tenosynovitis, unspecified: Secondary | ICD-10-CM | POA: Diagnosis not present

## 2017-08-30 ENCOUNTER — Ambulatory Visit (INDEPENDENT_AMBULATORY_CARE_PROVIDER_SITE_OTHER): Payer: BLUE CROSS/BLUE SHIELD | Admitting: Family Medicine

## 2017-08-30 VITALS — BP 117/77 | HR 84 | Temp 98.4°F | Ht 63.0 in | Wt 197.0 lb

## 2017-08-30 DIAGNOSIS — E8881 Metabolic syndrome: Secondary | ICD-10-CM

## 2017-08-30 DIAGNOSIS — E559 Vitamin D deficiency, unspecified: Secondary | ICD-10-CM

## 2017-08-30 DIAGNOSIS — Z6835 Body mass index (BMI) 35.0-35.9, adult: Secondary | ICD-10-CM | POA: Diagnosis not present

## 2017-08-30 DIAGNOSIS — Z9189 Other specified personal risk factors, not elsewhere classified: Secondary | ICD-10-CM | POA: Diagnosis not present

## 2017-08-30 DIAGNOSIS — E66812 Obesity, class 2: Secondary | ICD-10-CM

## 2017-08-30 DIAGNOSIS — E88819 Insulin resistance, unspecified: Secondary | ICD-10-CM

## 2017-08-30 MED ORDER — VITAMIN D (ERGOCALCIFEROL) 1.25 MG (50000 UNIT) PO CAPS: 50000.0000 [IU] | ORAL_CAPSULE | ORAL | 0 refills | Status: DC

## 2017-08-30 NOTE — Progress Notes (Signed)
Office: [phone]  /  Fax: [phone]   HPI:   Chief Complaint: OBESITY Morgan Long is here to discuss her progress with her obesity treatment plan. She is on the Category 3 plan and is following her eating plan approximately 15 % of the time. She states she is exercising 0 minutes 0 times per week. Morgan Long went grocery shopping 3 days ago. She ate lots of m&m's. Her weight is 197 lb (89.4 kg) today and has had a weight loss of 3 pounds over a period of 2 weeks since her last visit. She has lost 3 lbs since starting treatment with [name].  Vitamin D deficiency Morgan Long has a diagnosis of vitamin D deficiency. She is not currently taking OTC vit D and denies nausea, vomiting or muscle weakness.  Insulin Resistance Morgan Long has a diagnosis of insulin resistance based on her elevated fasting insulin level of 8.0. Her last Hgb A1c was at 5.5  Although Morgan Long's blood glucose readings are still under good control, insulin resistance puts her at greater risk of metabolic syndrome and diabetes. She is not taking metformin currently and continues to work on diet and exercise to decrease risk of diabetes.  At risk for diabetes Morgan Long is at higher than average risk for developing diabetes due to her obesity and insulin resistance. She currently denies polyuria or polydipsia.  ALLERGIES: Allergies  Allergen Reactions  . Acetaminophen Itching    Patient can take tylenol, just not tylenol#3  . Hydrocodone Nausea Only  . Oxycodone Itching  . Phosphate Itching    sick  . Codeine Itching and Nausea Only    MEDICATIONS: Current Outpatient Medications on File Prior to Visit  Medication Sig Dispense Refill  . beclomethasone (QVAR REDIHALER) 80 MCG/ACT inhaler Inhale 2 puffs 2 (two) times daily into the lungs. 10.6 g 5  . budesonide-formoterol (SYMBICORT) 160-4.5 MCG/ACT inhaler Inhale 2 puffs 2 (two) times daily into the lungs. 1 Inhaler 5  . cetirizine (ZYRTEC) 10 MG tablet Take 1 tablet (10 mg total) daily by  mouth. 30 tablet 5  . ESTROGEL 0.75 MG/1.25 GM (0.06%) topical gel Apply 1 application topically See admin instructions.  0  . etonogestrel-ethinyl estradiol (NUVARING) 0.12-0.015 MG/24HR vaginal ring Place 1 each vaginally every 28 (twenty-eight) days. Insert vaginally and leave in place for 3 consecutive weeks, then remove for 1 week.     Marland Kitchen ibuprofen (ADVIL,MOTRIN) 800 MG tablet Take 800 mg by mouth every 6 (six) hours as needed. for pain  3  . LINZESS 72 MCG capsule TAKE 1 CAPSULE (72 MCG TOTAL) BY MOUTH DAILY BEFORE BREAKFAST. 30 capsule 8  . Multiple Vitamin (MULTIVITAMIN) LIQD Take 5 mLs by mouth daily.    Marland Kitchen omeprazole (PRILOSEC) 40 MG capsule Take 1 capsule (40 mg total) by mouth 2 (two) times daily. 60 capsule 3  . ondansetron (ZOFRAN) 4 MG tablet Take 1 tablet (4 mg total) by mouth as needed for nausea or vomiting (may take 8 mg before each half of prep). 4 tablet 0  . ranitidine (ZANTAC) 150 MG tablet Take 1 tablet (150 mg total) 2 (two) times daily by mouth. 60 tablet 5  . RESTASIS MULTIDOSE 0.05 % ophthalmic emulsion Place 1 drop into both eyes as needed.  3  . rizatriptan (MAXALT-MLT) 10 MG disintegrating tablet Take 1 tablet (10 mg total) by mouth as needed for migraine. May repeat in 2 hours if needed 9 tablet 11   Current Facility-Administered Medications on File Prior to Visit  Medication Dose Route  Frequency Provider Last Rate Last Dose  . 0.9 %  sodium chloride infusion  500 mL Intravenous Continuous Pyrtle, Lajuan Lines, MD        PAST MEDICAL HISTORY: Past Medical History:  Diagnosis Date  . Allergic rhinitis   . Asthma   . Chronic migraine    Dr. Catalina Gravel  . Constipation   . Dry eye syndrome of both lacrimal glands   . Dry skin   . Endometriosis   . Fatigue   . Gastroparesis   . GERD (gastroesophageal reflux disease)   . Headache(784.0)    occasional, dx w/ Migraines before, Topamax helps  . Heartburn   . History of stomach ulcers   . Lactose intolerance   . Localized  edema   . Migraine   . Muscle weakness (generalized)   . Nuclear cataract of both eyes    Mild  . Overweight   . Pain in right ankle and joints of right foot   . Shortness of breath   . Sinus complaint   . Sinusitis   . Tired   . TMJ pain dysfunction syndrome    occasional    PAST SURGICAL HISTORY: Past Surgical History:  Procedure Laterality Date  . AUGMENTATION MAMMAPLASTY Bilateral 2006  . BREAST ENHANCEMENT SURGERY  2006  . BUNIONECTOMY    . COLONOSCOPY    . endrometroisis    . fallopian tube removed     Left  . UPPER GASTROINTESTINAL ENDOSCOPY    . wisdoim teeth extraction      SOCIAL HISTORY: Social History   Tobacco Use  . Smoking status: Never Smoker  . Smokeless tobacco: Never Used  Substance Use Topics  . Alcohol use: Yes    Alcohol/week: 0.0 oz    Comment: socially - occasional   . Drug use: No    FAMILY HISTORY: Family History  Problem Relation Age of Onset  . Throat cancer Mother   . Cancer - Other Mother 55       throat - died 4 months  . Cancer Mother   . Alcoholism Mother   . Heart disease Father   . Cancer Father   . Breast cancer Maternal Aunt        breast  . Colon cancer Maternal Uncle 31       died 64  . Breast cancer Maternal Aunt        breast  . Pancreatic cancer Maternal Aunt   . Diabetes Unknown        grandmother  . Hypertension Maternal Aunt        several family members  . Throat cancer Maternal Uncle   . Asthma Unknown        cousin, maternal  . Breast cancer Maternal Aunt        total of 5 aunts  . Breast cancer Maternal Aunt   . Colon cancer Paternal Grandfather   . Heart attack Neg Hx   . Rectal cancer Neg Hx   . Stomach cancer Neg Hx     ROS: Review of Systems  Constitutional: Positive for weight loss.  Gastrointestinal: Negative for nausea and vomiting.  Genitourinary: Negative for frequency.  Musculoskeletal:       Negative for muscle weakness  Endo/Heme/Allergies: Negative for polydipsia.     PHYSICAL EXAM: Blood pressure 117/77, pulse 84, temperature 98.4 F (36.9 C), temperature source Oral, height 5\' 3"  (1.6 m), weight 197 lb (89.4 kg), last menstrual period 08/23/2017. Body mass index is 34.9 kg/m.  Physical Exam  Constitutional: She is oriented to person, place, and time. She appears well-developed and well-nourished.  Cardiovascular: Normal rate.  Pulmonary/Chest: Effort normal.  Musculoskeletal: Normal range of motion.  Neurological: She is oriented to person, place, and time.  Skin: Skin is warm and dry.  Psychiatric: She has a normal mood and affect. Her behavior is normal.  Vitals reviewed.   RECENT LABS AND TESTS: BMET    Component Value Date/Time   NA 138 08/14/2017 1152   K 4.1 08/14/2017 1152   CL 102 08/14/2017 1152   CO2 22 08/14/2017 1152   GLUCOSE 86 08/14/2017 1152   GLUCOSE 89 02/27/2017 1552   BUN 13 08/14/2017 1152   CREATININE 0.76 08/14/2017 1152   CALCIUM 9.1 08/14/2017 1152   GFRNONAA 93 08/14/2017 1152   GFRAA 107 08/14/2017 1152   Lab Results  Component Value Date   HGBA1C 5.5 08/14/2017   HGBA1C 5.6 06/09/2015   HGBA1C 5.7 (H) 05/22/2014   Lab Results  Component Value Date   INSULIN 8.0 08/14/2017   CBC    Component Value Date/Time   WBC 9.0 08/14/2017 1152   WBC 9.7 03/30/2017 1512   RBC 4.26 08/14/2017 1152   RBC 4.48 03/30/2017 1512   HGB 12.3 08/14/2017 1152   HCT 38.6 08/14/2017 1152   PLT 416 (H) 03/30/2017 1512   MCV 91 08/14/2017 1152   MCH 28.9 08/14/2017 1152   MCH 28.6 03/30/2017 1512   MCHC 31.9 08/14/2017 1152   MCHC 32.5 03/30/2017 1512   RDW 13.7 08/14/2017 1152   LYMPHSABS 2.3 08/14/2017 1152   MONOABS 0.7 02/27/2017 1552   EOSABS 0.1 08/14/2017 1152   BASOSABS 0.0 08/14/2017 1152   Iron/TIBC/Ferritin/ %Sat No results found for: IRON, TIBC, FERRITIN, IRONPCTSAT Lipid Panel     Component Value Date/Time   CHOL 188 08/14/2017 1152   TRIG 92 08/14/2017 1152   HDL 71 08/14/2017 1152    CHOLHDL 3 05/14/2014 1223   VLDL 12.2 05/14/2014 1223   LDLCALC 99 08/14/2017 1152   Hepatic Function Panel     Component Value Date/Time   PROT 7.0 08/14/2017 1152   ALBUMIN 4.2 08/14/2017 1152   AST 17 08/14/2017 1152   ALT 18 08/14/2017 1152   ALKPHOS 73 08/14/2017 1152   BILITOT 0.2 08/14/2017 1152   BILIDIR 0.0 05/14/2014 1223      Component Value Date/Time   TSH 0.976 08/14/2017 1152   TSH 1.26 05/14/2014 1223   TSH 1.45 02/06/2014 1107   Results for ELLISE, KOVACK A (MRN [id number]) as of 08/30/2017 15:12  Ref. Range 08/14/2017 11:52  Vitamin D, 25-Hydroxy Latest Ref Range: 30.0 - 100.0 ng/mL 26.9 (L)   ASSESSMENT AND PLAN: Vitamin D deficiency - Plan: Vitamin D, Ergocalciferol, (DRISDOL) 50000 units CAPS capsule  Insulin resistance  At risk for diabetes mellitus  Class 2 severe obesity with serious comorbidity and body mass index (BMI) of 35.0 to 35.9 in adult, unspecified obesity type ([place])  PLAN:  Vitamin D Deficiency Morgan Long was informed that low vitamin D levels contributes to fatigue and are associated with obesity, breast, and colon cancer. She agrees to start to take prescription Vit D @50 ,000 IU every week #4 with no refills and will follow up for routine testing of vitamin D, at least 2-3 times per year. She was informed of the risk of over-replacement of vitamin D and agrees to not increase her dose unless she discusses this with [name] first. Morgan Long agrees to follow up  with our clinic in 2 weeks.  Insulin Resistance Morgan Long will continue to work on weight loss, exercise, and decreasing simple carbohydrates in her diet to help decrease the risk of diabetes. She was informed that eating too many simple carbohydrates or too many calories at one sitting increases the likelihood of GI side effects. Morgan Long will continue with the category 3 plan and we will recheck Hgb A1c and insulin in 3 months. Morgan Long agreed to follow up with [name] as directed to monitor her progress.  Diabetes  risk counseling Morgan Long was given extended (15 minutes) diabetes prevention counseling today. She is 47 y.o. female and has risk factors for diabetes including obesity and insulin resistance. We discussed intensive lifestyle modifications today with an emphasis on weight loss as well as increasing exercise and decreasing simple carbohydrates in her diet.  Obesity Morgan Long is currently in the action stage of change. As such, her goal is to continue with weight loss efforts She has agreed to follow the Category 3 plan Morgan Long has been instructed to work up to a goal of 150 minutes of combined cardio and strengthening exercise per week for weight loss and overall health benefits. We discussed the following Behavioral Modification Strategies today: keeping healthy foods in the home, planning for success, increasing lean protein intake and work on meal planning and easy cooking plans  Morgan Long has agreed to follow up with our clinic in 2 weeks. She was informed of the importance of frequent follow up visits to maximize her success with intensive lifestyle modifications for her multiple health conditions.   OBESITY BEHAVIORAL INTERVENTION VISIT  Today's visit was # 2 out of 22.  Starting weight: 200 lbs Starting date: 08/14/17 Today's weight : 197 lbs Today's date: 08/30/2017 Total lbs lost to date: 3 (Patients must lose 7 lbs in the first 6 months to continue with counseling)   ASK: We discussed the diagnosis of obesity with Morgan Long today and Morgan Long agreed to give [name] permission to discuss obesity behavioral modification therapy today.  ASSESS: Morgan Long has the diagnosis of obesity and her BMI today is 34.91 Morgan Long is in the action stage of change   ADVISE: Morgan Long was educated on the multiple health risks of obesity as well as the benefit of weight loss to improve her health. She was advised of the need for long term treatment and the importance of lifestyle modifications.  AGREE: Multiple dietary  modification options and treatment options were discussed and  Morgan Long agreed to the above obesity treatment plan.  I, Doreene Nest, am acting as transcriptionist for Eber Jones, MD  I have reviewed the above documentation for accuracy and completeness, and I agree with the above. - Ilene Qua, MD

## 2017-08-31 DIAGNOSIS — R7303 Prediabetes: Secondary | ICD-10-CM | POA: Diagnosis not present

## 2017-09-01 ENCOUNTER — Other Ambulatory Visit: Payer: Self-pay | Admitting: Family Medicine

## 2017-09-07 ENCOUNTER — Encounter: Payer: Self-pay | Admitting: Family Medicine

## 2017-09-07 ENCOUNTER — Ambulatory Visit: Payer: BLUE CROSS/BLUE SHIELD | Admitting: Family Medicine

## 2017-09-07 VITALS — BP 98/58 | HR 86 | Temp 98.7°F | Wt 206.0 lb

## 2017-09-07 DIAGNOSIS — M629 Disorder of muscle, unspecified: Secondary | ICD-10-CM | POA: Diagnosis not present

## 2017-09-07 DIAGNOSIS — R29898 Other symptoms and signs involving the musculoskeletal system: Secondary | ICD-10-CM

## 2017-09-07 MED ORDER — CYCLOBENZAPRINE HCL 10 MG PO TABS: 10.0000 mg | ORAL_TABLET | Freq: Three times a day (TID) | ORAL | 0 refills | Status: DC | PRN

## 2017-09-07 NOTE — Patient Instructions (Signed)

## 2017-09-07 NOTE — Progress Notes (Signed)
Subjective:  I acted as a Education administrator for Dr. Rosemary Holms, RMA  Patient ID: Morgan Long, female    DOB: 05/01/1971, 47 y.o.   MRN: [id number]  CC: upper mid back pain  HPI  Patient is in today for an acute visit for upper back pain. She states this has been on going for a week now. She states she woke up on morning and it was hurting. She says when she coughs it hurts.  She is having more migraines but is having a botox injection next week.    Patient Care Team: Carollee Herter, Alferd Apa, DO as PCP - General (Family Medicine) Lyndal Pulley, DO as Attending Physician (Family Medicine) Virgina Evener, OD as Referring Physician (Optometry) Carollee Herter, Alferd Apa, DO as Consulting Physician (Family Medicine) Leta Baptist, Earlean Polka, MD as Consulting Physician (Neurology)   Past Medical History:  Diagnosis Date  . Allergic rhinitis   . Asthma   . Chronic migraine    Dr. Catalina Gravel  . Constipation   . Dry eye syndrome of both lacrimal glands   . Dry skin   . Endometriosis   . Fatigue   . Gastroparesis   . GERD (gastroesophageal reflux disease)   . Headache(784.0)    occasional, dx w/ Migraines before, Topamax helps  . Heartburn   . History of stomach ulcers   . Lactose intolerance   . Localized edema   . Migraine   . Muscle weakness (generalized)   . Nuclear cataract of both eyes    Mild  . Overweight   . Pain in right ankle and joints of right foot   . Shortness of breath   . Sinus complaint   . Sinusitis   . Tired   . TMJ pain dysfunction syndrome    occasional    Past Surgical History:  Procedure Laterality Date  . AUGMENTATION MAMMAPLASTY Bilateral 2006  . BREAST ENHANCEMENT SURGERY  2006  . BUNIONECTOMY    . COLONOSCOPY    . endrometroisis    . fallopian tube removed     Left  . UPPER GASTROINTESTINAL ENDOSCOPY    . wisdoim teeth extraction      Family History  Problem Relation Age of Onset  . Throat cancer Mother   . Cancer - Other Mother 55   throat - died 68 months  . Cancer Mother   . Alcoholism Mother   . Heart disease Father   . Cancer Father   . Breast cancer Maternal Aunt        breast  . Colon cancer Maternal Uncle 80       died 75  . Breast cancer Maternal Aunt        breast  . Pancreatic cancer Maternal Aunt   . Diabetes Unknown        grandmother  . Hypertension Maternal Aunt        several family members  . Throat cancer Maternal Uncle   . Asthma Unknown        cousin, maternal  . Breast cancer Maternal Aunt        total of 5 aunts  . Breast cancer Maternal Aunt   . Colon cancer Paternal Grandfather   . Heart attack Neg Hx   . Rectal cancer Neg Hx   . Stomach cancer Neg Hx     Social History   Socioeconomic History  . Marital status: Single    Spouse name: Not on file  . Number of children:  0  . Years of education: BS  . Highest education level: Not on file  Occupational History  . Occupation: Paramedic, going to school    Employer: [employer]  . Occupation: Air traffic controller  Social Needs  . Financial resource strain: Not on file  . Food insecurity:    Worry: Not on file    Inability: Not on file  . Transportation needs:    Medical: Not on file    Non-medical: Not on file  Tobacco Use  . Smoking status: Never Smoker  . Smokeless tobacco: Never Used  Substance and Sexual Activity  . Alcohol use: Yes    Alcohol/week: 0.0 oz    Comment: socially - occasional   . Drug use: No  . Sexual activity: Yes    Birth control/protection: Inserts    Comment: nuvaring  Lifestyle  . Physical activity:    Days per week: Not on file    Minutes per session: Not on file  . Stress: Not on file  Relationships  . Social connections:    Talks on phone: Not on file    Gets together: Not on file    Attends religious service: Not on file    Active member of club or organization: Not on file    Attends meetings of clubs or organizations: Not on file    Relationship status: Not on file  .  Intimate partner violence:    Fear of current or ex partner: Not on file    Emotionally abused: Not on file    Physically abused: Not on file    Forced sexual activity: Not on file  Other Topics Concern  . Not on file  Social History Narrative   Household:sister and her 3 kids    Drinks occasional starbucks drink        Outpatient Medications Prior to Visit  Medication Sig Dispense Refill  . beclomethasone (QVAR REDIHALER) 80 MCG/ACT inhaler Inhale 2 puffs 2 (two) times daily into the lungs. 10.6 g 5  . budesonide-formoterol (SYMBICORT) 160-4.5 MCG/ACT inhaler Inhale 2 puffs 2 (two) times daily into the lungs. 1 Inhaler 5  . cetirizine (ZYRTEC) 10 MG tablet Take 1 tablet (10 mg total) daily by mouth. 30 tablet 5  . ESTROGEL 0.75 MG/1.25 GM (0.06%) topical gel Apply 1 application topically See admin instructions.  0  . etonogestrel-ethinyl estradiol (NUVARING) 0.12-0.015 MG/24HR vaginal ring Place 1 each vaginally every 28 (twenty-eight) days. Insert vaginally and leave in place for 3 consecutive weeks, then remove for 1 week.     Marland Kitchen ibuprofen (ADVIL,MOTRIN) 800 MG tablet Take 800 mg by mouth every 6 (six) hours as needed. for pain  3  . LINZESS 72 MCG capsule TAKE 1 CAPSULE (72 MCG TOTAL) BY MOUTH DAILY BEFORE BREAKFAST. 30 capsule 8  . Multiple Vitamin (MULTIVITAMIN) LIQD Take 5 mLs by mouth daily.    Marland Kitchen omeprazole (PRILOSEC) 40 MG capsule Take 1 capsule (40 mg total) by mouth 2 (two) times daily. 60 capsule 3  . ondansetron (ZOFRAN-ODT) 4 MG disintegrating tablet TAKE 1-2 TABLETS BY MOUTH 3 TIMES A DAY AS NEEDED 16 tablet 1  . ranitidine (ZANTAC) 150 MG tablet Take 1 tablet (150 mg total) 2 (two) times daily by mouth. 60 tablet 5  . RESTASIS MULTIDOSE 0.05 % ophthalmic emulsion Place 1 drop into both eyes as needed.  3  . rizatriptan (MAXALT-MLT) 10 MG disintegrating tablet Take 1 tablet (10 mg total) by mouth as needed for  migraine. May repeat in 2 hours if needed 9 tablet 11  . Vitamin  D, Ergocalciferol, (DRISDOL) 50000 units CAPS capsule Take 1 capsule (50,000 Units total) by mouth every 7 (seven) days. 4 capsule 0  . ondansetron (ZOFRAN) 4 MG tablet Take 1 tablet (4 mg total) by mouth as needed for nausea or vomiting (may take 8 mg before each half of prep). 4 tablet 0  . 0.9 %  sodium chloride infusion      No facility-administered medications prior to visit.     Allergies  Allergen Reactions  . Acetaminophen Itching    Patient can take tylenol, just not tylenol#3  . Hydrocodone Nausea Only  . Oxycodone Itching  . Phosphate Itching    sick  . Codeine Itching and Nausea Only    Review of Systems  Constitutional: Negative for fever and malaise/fatigue.  HENT: Negative for congestion.   Eyes: Negative for blurred vision.  Respiratory: Negative for cough and shortness of breath.   Cardiovascular: Negative for chest pain, palpitations and leg swelling.  Gastrointestinal: Negative for vomiting.  Musculoskeletal: Positive for myalgias and neck pain. Negative for back pain.  Skin: Negative for rash.  Neurological: Positive for headaches. Negative for loss of consciousness.        Objective:    Physical Exam  Constitutional: She is oriented to person, place, and time. She appears well-developed and well-nourished.  HENT:  Head: Normocephalic and atraumatic.  Eyes: Conjunctivae and EOM are normal.  Neck: Normal range of motion. Neck supple. No JVD present. Carotid bruit is not present. No thyromegaly present.  Cardiovascular: Normal rate, regular rhythm and normal heart sounds.  No murmur heard. Pulmonary/Chest: Effort normal and breath sounds normal. No respiratory distress. She has no wheezes. She has no rales. She exhibits no tenderness.  Musculoskeletal: She exhibits no edema.       Left shoulder: She exhibits tenderness.       Arms: Neurological: She is alert and oriented to person, place, and time.  Psychiatric: She has a normal mood and affect.    Nursing note and vitals reviewed.   BP (!) 98/58 (BP Location: Left Arm, Patient Position: Sitting, Cuff Size: Normal)   Pulse 86   Temp 98.7 F (37.1 C) (Oral)   Wt 206 lb (93.4 kg)   LMP 08/23/2017 (Exact Date)   BMI 36.49 kg/m  Wt Readings from Last 3 Encounters:  09/07/17 206 lb (93.4 kg)  08/30/17 197 lb (89.4 kg)  08/14/17 200 lb (90.7 kg)   BP Readings from Last 3 Encounters:  09/07/17 (!) 98/58  08/30/17 117/77  08/14/17 117/77     Immunization History  Administered Date(s) Administered  . Influenza Split 03/28/2011  . Influenza Whole 05/14/2007  . Influenza,inj,Quad PF,6+ Mos 04/24/2013, 02/27/2014, 02/27/2017  . Td 07/08/2007    Health Maintenance  Topic Date Due  . TETANUS/TDAP  07/07/2017  . INFLUENZA VACCINE  01/03/2018  . PAP SMEAR  05/07/2020  . HIV Screening  Completed    Lab Results  Component Value Date   WBC 9.0 08/14/2017   HGB 12.3 08/14/2017   HCT 38.6 08/14/2017   PLT 416 (H) 03/30/2017   GLUCOSE 86 08/14/2017   CHOL 188 08/14/2017   TRIG 92 08/14/2017   HDL 71 08/14/2017   LDLCALC 99 08/14/2017   ALT 18 08/14/2017   AST 17 08/14/2017   NA 138 08/14/2017   K 4.1 08/14/2017   CL 102 08/14/2017   CREATININE 0.76 08/14/2017  BUN 13 08/14/2017   CO2 22 08/14/2017   TSH 0.976 08/14/2017   HGBA1C 5.5 08/14/2017    Lab Results  Component Value Date   TSH 0.976 08/14/2017   Lab Results  Component Value Date   WBC 9.0 08/14/2017   HGB 12.3 08/14/2017   HCT 38.6 08/14/2017   MCV 91 08/14/2017   PLT 416 (H) 03/30/2017   Lab Results  Component Value Date   NA 138 08/14/2017   K 4.1 08/14/2017   CO2 22 08/14/2017   GLUCOSE 86 08/14/2017   BUN 13 08/14/2017   CREATININE 0.76 08/14/2017   BILITOT 0.2 08/14/2017   ALKPHOS 73 08/14/2017   AST 17 08/14/2017   ALT 18 08/14/2017   PROT 7.0 08/14/2017   ALBUMIN 4.2 08/14/2017   CALCIUM 9.1 08/14/2017   ANIONGAP 7 03/19/2015   GFR 84.74 02/27/2017   Lab Results  Component  Value Date   CHOL 188 08/14/2017   Lab Results  Component Value Date   HDL 71 08/14/2017   Lab Results  Component Value Date   LDLCALC 99 08/14/2017   Lab Results  Component Value Date   TRIG 92 08/14/2017   Lab Results  Component Value Date   CHOLHDL 3 05/14/2014   Lab Results  Component Value Date   HGBA1C 5.5 08/14/2017         Assessment & Plan:   Problem List Items Addressed This Visit    None    Visit Diagnoses    Muscle relaxation    -  Primary   Relevant Medications   cyclobenzaprine (FLEXERIL) 10 MG tablet    stretches shown to the pt Consider pt vs chiropractor  I am having Afton A. Regner start on cyclobenzaprine. I am also having her maintain her etonogestrel-ethinyl estradiol, multivitamin, ESTROGEL, LINZESS, RESTASIS MULTIDOSE, ibuprofen, beclomethasone, cetirizine, ranitidine, budesonide-formoterol, rizatriptan, omeprazole, Vitamin D (Ergocalciferol), and ondansetron. We will stop administering sodium chloride.  Meds ordered this encounter  Medications  . cyclobenzaprine (FLEXERIL) 10 MG tablet    Sig: Take 1 tablet (10 mg total) by mouth 3 (three) times daily as needed for muscle spasms.    Dispense:  30 tablet    Refill:  0    CMA served as scribe during this visit. History, Physical and Plan performed by medical provider. Documentation and orders reviewed and attested to.  [name], DO

## 2017-09-08 ENCOUNTER — Encounter: Payer: Self-pay | Admitting: Family Medicine

## 2017-09-10 NOTE — Telephone Encounter (Signed)
If she has no fever-- we can do preventative --- tamilflu 75 mg 1 po qd x 10 days

## 2017-09-11 ENCOUNTER — Telehealth: Payer: Self-pay | Admitting: *Deleted

## 2017-09-11 MED ORDER — OSELTAMIVIR PHOSPHATE 75 MG PO CAPS: 75.0000 mg | ORAL_CAPSULE | Freq: Every day | ORAL | 0 refills | Status: DC

## 2017-09-11 NOTE — Telephone Encounter (Signed)
FMLA paperwork with Dr. Etter Sjogren to complete and sign.

## 2017-09-11 NOTE — Telephone Encounter (Signed)
Patient notified that paperwork is ready for pickup.  Also she had an email about a flu preventative.  rx sent in.

## 2017-09-12 DIAGNOSIS — M5022 Other cervical disc displacement, mid-cervical region, unspecified level: Secondary | ICD-10-CM | POA: Diagnosis not present

## 2017-09-12 DIAGNOSIS — M531 Cervicobrachial syndrome: Secondary | ICD-10-CM | POA: Diagnosis not present

## 2017-09-12 DIAGNOSIS — M9902 Segmental and somatic dysfunction of thoracic region: Secondary | ICD-10-CM | POA: Diagnosis not present

## 2017-09-12 DIAGNOSIS — M9901 Segmental and somatic dysfunction of cervical region: Secondary | ICD-10-CM | POA: Diagnosis not present

## 2017-09-13 ENCOUNTER — Ambulatory Visit (INDEPENDENT_AMBULATORY_CARE_PROVIDER_SITE_OTHER): Payer: BLUE CROSS/BLUE SHIELD | Admitting: Family Medicine

## 2017-09-13 VITALS — BP 109/72 | HR 94 | Temp 98.4°F | Ht 63.0 in | Wt 200.0 lb

## 2017-09-13 DIAGNOSIS — Z9189 Other specified personal risk factors, not elsewhere classified: Secondary | ICD-10-CM

## 2017-09-13 DIAGNOSIS — Z6835 Body mass index (BMI) 35.0-35.9, adult: Secondary | ICD-10-CM

## 2017-09-13 DIAGNOSIS — M531 Cervicobrachial syndrome: Secondary | ICD-10-CM | POA: Diagnosis not present

## 2017-09-13 DIAGNOSIS — M9901 Segmental and somatic dysfunction of cervical region: Secondary | ICD-10-CM | POA: Diagnosis not present

## 2017-09-13 DIAGNOSIS — E8881 Metabolic syndrome: Secondary | ICD-10-CM | POA: Diagnosis not present

## 2017-09-13 DIAGNOSIS — M5022 Other cervical disc displacement, mid-cervical region, unspecified level: Secondary | ICD-10-CM | POA: Diagnosis not present

## 2017-09-13 DIAGNOSIS — M9902 Segmental and somatic dysfunction of thoracic region: Secondary | ICD-10-CM | POA: Diagnosis not present

## 2017-09-13 MED ORDER — METFORMIN HCL 500 MG PO TABS: 500.0000 mg | ORAL_TABLET | Freq: Every day | ORAL | 0 refills | Status: DC

## 2017-09-17 DIAGNOSIS — S93492A Sprain of other ligament of left ankle, initial encounter: Secondary | ICD-10-CM | POA: Diagnosis not present

## 2017-09-17 DIAGNOSIS — M9901 Segmental and somatic dysfunction of cervical region: Secondary | ICD-10-CM | POA: Diagnosis not present

## 2017-09-17 DIAGNOSIS — M531 Cervicobrachial syndrome: Secondary | ICD-10-CM | POA: Diagnosis not present

## 2017-09-17 DIAGNOSIS — M25372 Other instability, left ankle: Secondary | ICD-10-CM | POA: Diagnosis not present

## 2017-09-17 DIAGNOSIS — M9902 Segmental and somatic dysfunction of thoracic region: Secondary | ICD-10-CM | POA: Diagnosis not present

## 2017-09-17 DIAGNOSIS — M5022 Other cervical disc displacement, mid-cervical region, unspecified level: Secondary | ICD-10-CM | POA: Diagnosis not present

## 2017-09-17 NOTE — Progress Notes (Signed)
Office: [phone]  /  Fax: [phone]   HPI:   Chief Complaint: OBESITY Morgan Long is here to discuss her progress with her obesity treatment plan. She is on the Category 3 plan and is following her eating plan approximately 50 % of the time. She states she is doing cardio for 45 minutes 1 time per week. Morgan Long is struggling with planning food ahead of time. She has increased stress with studying. She asks about phentermine to help with weight loss. Her weight is 200 lb (90.7 kg) today and has had a weight gain of 3 pounds over a period of 2 weeks since her last visit. She has lost 0 lbs since starting treatment with [name].  Insulin Resistance Morgan Long has a diagnosis of insulin resistance based on her elevated fasting insulin level >5. Although Morgan Long's blood glucose readings are still under good control, insulin resistance puts her at greater risk of metabolic syndrome and diabetes. She is not taking metformin currently and she notes polyphagia, worse in the second half of the day. She is struggling to follow her diet prescription. She  continues to work on diet and exercise to decrease risk of diabetes.  At risk for diabetes Morgan Long is at higher than average risk for developing diabetes due to her obesity and insulin resistance. She currently denies polyuria or polydipsia.  ALLERGIES: Allergies  Allergen Reactions  . Acetaminophen Itching    Patient can take tylenol, just not tylenol#3  . Hydrocodone Nausea Only  . Oxycodone Itching  . Phosphate Itching    sick  . Codeine Itching and Nausea Only    MEDICATIONS: Current Outpatient Medications on File Prior to Visit  Medication Sig Dispense Refill  . beclomethasone (QVAR REDIHALER) 80 MCG/ACT inhaler Inhale 2 puffs 2 (two) times daily into the lungs. 10.6 g 5  . budesonide-formoterol (SYMBICORT) 160-4.5 MCG/ACT inhaler Inhale 2 puffs 2 (two) times daily into the lungs. 1 Inhaler 5  . cetirizine (ZYRTEC) 10 MG tablet Take 1 tablet (10 mg total)  daily by mouth. 30 tablet 5  . cyclobenzaprine (FLEXERIL) 10 MG tablet Take 1 tablet (10 mg total) by mouth 3 (three) times daily as needed for muscle spasms. 30 tablet 0  . ESTROGEL 0.75 MG/1.25 GM (0.06%) topical gel Apply 1 application topically See admin instructions.  0  . etonogestrel-ethinyl estradiol (NUVARING) 0.12-0.015 MG/24HR vaginal ring Place 1 each vaginally every 28 (twenty-eight) days. Insert vaginally and leave in place for 3 consecutive weeks, then remove for 1 week.     Marland Kitchen ibuprofen (ADVIL,MOTRIN) 800 MG tablet Take 800 mg by mouth every 6 (six) hours as needed. for pain  3  . LINZESS 72 MCG capsule TAKE 1 CAPSULE (72 MCG TOTAL) BY MOUTH DAILY BEFORE BREAKFAST. 30 capsule 8  . Multiple Vitamin (MULTIVITAMIN) LIQD Take 5 mLs by mouth daily.    Marland Kitchen omeprazole (PRILOSEC) 40 MG capsule Take 1 capsule (40 mg total) by mouth 2 (two) times daily. 60 capsule 3  . ondansetron (ZOFRAN-ODT) 4 MG disintegrating tablet TAKE 1-2 TABLETS BY MOUTH 3 TIMES A DAY AS NEEDED 16 tablet 1  . oseltamivir (TAMIFLU) 75 MG capsule Take 1 capsule (75 mg total) by mouth daily. 10 capsule 0  . ranitidine (ZANTAC) 150 MG tablet Take 1 tablet (150 mg total) 2 (two) times daily by mouth. 60 tablet 5  . RESTASIS MULTIDOSE 0.05 % ophthalmic emulsion Place 1 drop into both eyes as needed.  3  . rizatriptan (MAXALT-MLT) 10 MG disintegrating tablet Take 1  tablet (10 mg total) by mouth as needed for migraine. May repeat in 2 hours if needed 9 tablet 11  . Vitamin D, Ergocalciferol, (DRISDOL) 50000 units CAPS capsule Take 1 capsule (50,000 Units total) by mouth every 7 (seven) days. 4 capsule 0   No current facility-administered medications on file prior to visit.     PAST MEDICAL HISTORY: Past Medical History:  Diagnosis Date  . Allergic rhinitis   . Asthma   . Chronic migraine    Dr. Catalina Gravel  . Constipation   . Dry eye syndrome of both lacrimal glands   . Dry skin   . Endometriosis   . Fatigue   .  Gastroparesis   . GERD (gastroesophageal reflux disease)   . Headache(784.0)    occasional, dx w/ Migraines before, Topamax helps  . Heartburn   . History of stomach ulcers   . Lactose intolerance   . Localized edema   . Migraine   . Muscle weakness (generalized)   . Nuclear cataract of both eyes    Mild  . Overweight   . Pain in right ankle and joints of right foot   . Shortness of breath   . Sinus complaint   . Sinusitis   . Tired   . TMJ pain dysfunction syndrome    occasional    PAST SURGICAL HISTORY: Past Surgical History:  Procedure Laterality Date  . AUGMENTATION MAMMAPLASTY Bilateral 2006  . BREAST ENHANCEMENT SURGERY  2006  . BUNIONECTOMY    . COLONOSCOPY    . endrometroisis    . fallopian tube removed     Left  . UPPER GASTROINTESTINAL ENDOSCOPY    . wisdoim teeth extraction      SOCIAL HISTORY: Social History   Tobacco Use  . Smoking status: Never Smoker  . Smokeless tobacco: Never Used  Substance Use Topics  . Alcohol use: Yes    Alcohol/week: 0.0 oz    Comment: socially - occasional   . Drug use: No    FAMILY HISTORY: Family History  Problem Relation Age of Onset  . Throat cancer Mother   . Cancer - Other Mother 55       throat - died 3 months  . Cancer Mother   . Alcoholism Mother   . Heart disease Father   . Cancer Father   . Breast cancer Maternal Aunt        breast  . Colon cancer Maternal Uncle 38       died 61  . Breast cancer Maternal Aunt        breast  . Pancreatic cancer Maternal Aunt   . Diabetes Unknown        grandmother  . Hypertension Maternal Aunt        several family members  . Throat cancer Maternal Uncle   . Asthma Unknown        cousin, maternal  . Breast cancer Maternal Aunt        total of 5 aunts  . Breast cancer Maternal Aunt   . Colon cancer Paternal Grandfather   . Heart attack Neg Hx   . Rectal cancer Neg Hx   . Stomach cancer Neg Hx     ROS: Review of Systems  Constitutional: Negative for  weight loss.  Genitourinary: Negative for frequency.  Endo/Heme/Allergies: Negative for polydipsia.    PHYSICAL EXAM: Blood pressure 109/72, pulse 94, temperature 98.4 F (36.9 C), temperature source Oral, height 5\' 3"  (1.6 m), weight 200 lb (90.7  kg), last menstrual period 08/23/2017, SpO2 98 %. Body mass index is 35.43 kg/m. Physical Exam  Constitutional: She is oriented to person, place, and time. She appears well-developed and well-nourished.  Cardiovascular: Normal rate.  Pulmonary/Chest: Effort normal.  Musculoskeletal: Normal range of motion.  Neurological: She is oriented to person, place, and time.  Skin: Skin is warm and dry.  Psychiatric: She has a normal mood and affect. Her behavior is normal.  Vitals reviewed.   RECENT LABS AND TESTS: BMET    Component Value Date/Time   NA 138 08/14/2017 1152   K 4.1 08/14/2017 1152   CL 102 08/14/2017 1152   CO2 22 08/14/2017 1152   GLUCOSE 86 08/14/2017 1152   GLUCOSE 89 02/27/2017 1552   BUN 13 08/14/2017 1152   CREATININE 0.76 08/14/2017 1152   CALCIUM 9.1 08/14/2017 1152   GFRNONAA 93 08/14/2017 1152   GFRAA 107 08/14/2017 1152   Lab Results  Component Value Date   HGBA1C 5.5 08/14/2017   HGBA1C 5.6 06/09/2015   HGBA1C 5.7 (H) 05/22/2014   Lab Results  Component Value Date   INSULIN 8.0 08/14/2017   CBC    Component Value Date/Time   WBC 9.0 08/14/2017 1152   WBC 9.7 03/30/2017 1512   RBC 4.26 08/14/2017 1152   RBC 4.48 03/30/2017 1512   HGB 12.3 08/14/2017 1152   HCT 38.6 08/14/2017 1152   PLT 416 (H) 03/30/2017 1512   MCV 91 08/14/2017 1152   MCH 28.9 08/14/2017 1152   MCH 28.6 03/30/2017 1512   MCHC 31.9 08/14/2017 1152   MCHC 32.5 03/30/2017 1512   RDW 13.7 08/14/2017 1152   LYMPHSABS 2.3 08/14/2017 1152   MONOABS 0.7 02/27/2017 1552   EOSABS 0.1 08/14/2017 1152   BASOSABS 0.0 08/14/2017 1152   Iron/TIBC/Ferritin/ %Sat No results found for: IRON, TIBC, FERRITIN, IRONPCTSAT Lipid Panel       Component Value Date/Time   CHOL 188 08/14/2017 1152   TRIG 92 08/14/2017 1152   HDL 71 08/14/2017 1152   CHOLHDL 3 05/14/2014 1223   VLDL 12.2 05/14/2014 1223   LDLCALC 99 08/14/2017 1152   Hepatic Function Panel     Component Value Date/Time   PROT 7.0 08/14/2017 1152   ALBUMIN 4.2 08/14/2017 1152   AST 17 08/14/2017 1152   ALT 18 08/14/2017 1152   ALKPHOS 73 08/14/2017 1152   BILITOT 0.2 08/14/2017 1152   BILIDIR 0.0 05/14/2014 1223      Component Value Date/Time   TSH 0.976 08/14/2017 1152   TSH 1.26 05/14/2014 1223   TSH 1.45 02/06/2014 1107   Results for KLEO, DUNGEE A (MRN [id number]) as of 09/17/2017 12:47  Ref. Range 08/14/2017 11:52  Vitamin D, 25-Hydroxy Latest Ref Range: 30.0 - 100.0 ng/mL 26.9 (L)   ASSESSMENT AND PLAN: Insulin resistance - Plan: metFORMIN (GLUCOPHAGE) 500 MG tablet  At risk for diabetes mellitus  Class 2 severe obesity with serious comorbidity and body mass index (BMI) of 35.0 to 35.9 in adult, unspecified obesity type ([place])  PLAN:  Insulin Resistance Morgan Long will continue to work on weight loss, exercise, and decreasing simple carbohydrates in her diet to help decrease the risk of diabetes. We dicussed metformin including benefits and risks. She was informed that eating too many simple carbohydrates or too many calories at one sitting increases the likelihood of GI side effects. Morgan Long agrees to start metformin 500 mg q AM #30 with no refills. Morgan Long agreed to follow up with [name] as directed to  monitor her progress.  Diabetes risk counseling Morgan Long was given extended (15 minutes) diabetes prevention counseling today. She is 47 y.o. female and has risk factors for diabetes including obesity and insulin resistance. We discussed intensive lifestyle modifications today with an emphasis on weight loss as well as increasing exercise and decreasing simple carbohydrates in her diet.  Obesity Morgan Long is currently in the action stage of change. As such, her goal  is to continue with weight loss efforts She has agreed to change to keeping a food journal with 1200 to 1450 calories and 80+ grams of protein daily Morgan Long has been instructed to work up to a goal of 150 minutes of combined cardio and strengthening exercise per week for weight loss and overall health benefits. We discussed the following Behavioral Modification Strategies today: no skipping meals, better snacking choices, increasing lean protein intake, increasing vegetables and work on meal planning and easy cooking plans  Morgan Long was advised phentermine was a short term fix, which does not have long term results. She was encouraged to work on meal prep and planning to help her eat the right nutrition.  Morgan Long has agreed to follow up with our clinic in 2 to 3 weeks. She was informed of the importance of frequent follow up visits to maximize her success with intensive lifestyle modifications for her multiple health conditions.   OBESITY BEHAVIORAL INTERVENTION VISIT  Today's visit was # 3 out of 22.  Starting weight: 200 lbs Starting date: 08/14/17 Today's weight : 200 lbs  Today's date: 09/13/2017 Total lbs lost to date: 0 (Patients must lose 7 lbs in the first 6 months to continue with counseling)   ASK: We discussed the diagnosis of obesity with [place] today and Neytiri agreed to give [name] permission to discuss obesity behavioral modification therapy today.  ASSESS: Morgan Long has the diagnosis of obesity and her BMI today is 35.44 Morgan Long is in the action stage of change   ADVISE: Morgan Long was educated on the multiple health risks of obesity as well as the benefit of weight loss to improve her health. She was advised of the need for long term treatment and the importance of lifestyle modifications.  AGREE: Multiple dietary modification options and treatment options were discussed and  Morgan Long agreed to the above obesity treatment plan.  I, Doreene Nest, am acting as transcriptionist for Dennard Nip, MD  I have reviewed the above documentation for accuracy and completeness, and I agree with the above. -Dennard Nip, MD

## 2017-09-18 DIAGNOSIS — M9901 Segmental and somatic dysfunction of cervical region: Secondary | ICD-10-CM | POA: Diagnosis not present

## 2017-09-18 DIAGNOSIS — M9902 Segmental and somatic dysfunction of thoracic region: Secondary | ICD-10-CM | POA: Diagnosis not present

## 2017-09-18 DIAGNOSIS — M5022 Other cervical disc displacement, mid-cervical region, unspecified level: Secondary | ICD-10-CM | POA: Diagnosis not present

## 2017-09-18 DIAGNOSIS — M531 Cervicobrachial syndrome: Secondary | ICD-10-CM | POA: Diagnosis not present

## 2017-09-20 ENCOUNTER — Ambulatory Visit: Payer: BLUE CROSS/BLUE SHIELD | Admitting: Neurology

## 2017-09-20 ENCOUNTER — Telehealth: Payer: Self-pay | Admitting: Neurology

## 2017-09-20 DIAGNOSIS — M531 Cervicobrachial syndrome: Secondary | ICD-10-CM | POA: Diagnosis not present

## 2017-09-20 DIAGNOSIS — M5022 Other cervical disc displacement, mid-cervical region, unspecified level: Secondary | ICD-10-CM | POA: Diagnosis not present

## 2017-09-20 DIAGNOSIS — G43719 Chronic migraine without aura, intractable, without status migrainosus: Secondary | ICD-10-CM | POA: Diagnosis not present

## 2017-09-20 DIAGNOSIS — M9902 Segmental and somatic dysfunction of thoracic region: Secondary | ICD-10-CM | POA: Diagnosis not present

## 2017-09-20 DIAGNOSIS — M9901 Segmental and somatic dysfunction of cervical region: Secondary | ICD-10-CM | POA: Diagnosis not present

## 2017-09-20 NOTE — Telephone Encounter (Signed)
[facility] [phone] called to advise botox will delivered by [employer] by noon tomorrow

## 2017-09-21 NOTE — Telephone Encounter (Signed)
Botox  Was delivered and put away .

## 2017-09-24 DIAGNOSIS — M9901 Segmental and somatic dysfunction of cervical region: Secondary | ICD-10-CM | POA: Diagnosis not present

## 2017-09-24 DIAGNOSIS — M9902 Segmental and somatic dysfunction of thoracic region: Secondary | ICD-10-CM | POA: Diagnosis not present

## 2017-09-24 DIAGNOSIS — M531 Cervicobrachial syndrome: Secondary | ICD-10-CM | POA: Diagnosis not present

## 2017-09-24 DIAGNOSIS — M5022 Other cervical disc displacement, mid-cervical region, unspecified level: Secondary | ICD-10-CM | POA: Diagnosis not present

## 2017-09-24 NOTE — Telephone Encounter (Signed)
Noted, thank you

## 2017-09-25 ENCOUNTER — Encounter: Payer: Self-pay | Admitting: Neurology

## 2017-09-25 ENCOUNTER — Telehealth: Payer: Self-pay | Admitting: Neurology

## 2017-09-25 ENCOUNTER — Ambulatory Visit (INDEPENDENT_AMBULATORY_CARE_PROVIDER_SITE_OTHER): Payer: BLUE CROSS/BLUE SHIELD | Admitting: Neurology

## 2017-09-25 VITALS — BP 137/93 | HR 91 | Ht 63.0 in | Wt 204.0 lb

## 2017-09-25 DIAGNOSIS — M531 Cervicobrachial syndrome: Secondary | ICD-10-CM | POA: Diagnosis not present

## 2017-09-25 DIAGNOSIS — G43719 Chronic migraine without aura, intractable, without status migrainosus: Secondary | ICD-10-CM | POA: Diagnosis not present

## 2017-09-25 DIAGNOSIS — M9902 Segmental and somatic dysfunction of thoracic region: Secondary | ICD-10-CM | POA: Diagnosis not present

## 2017-09-25 DIAGNOSIS — M9901 Segmental and somatic dysfunction of cervical region: Secondary | ICD-10-CM | POA: Diagnosis not present

## 2017-09-25 DIAGNOSIS — M5022 Other cervical disc displacement, mid-cervical region, unspecified level: Secondary | ICD-10-CM | POA: Diagnosis not present

## 2017-09-25 MED ORDER — ONABOTULINUMTOXINA 100 UNITS IJ SOLR
200.0000 [IU] | Freq: Once | INTRAMUSCULAR | Status: AC
  Administered 2017-09-25: 200 [IU] via INTRAMUSCULAR

## 2017-09-25 NOTE — Telephone Encounter (Signed)
12 wk Botox inj °

## 2017-09-25 NOTE — Patient Instructions (Signed)
Please remember, botulinum toxin takes about 3-7 days to kick in. As discussed, this is not a pain shot. The purpose of the injections is to gradually improve your symptoms. In some patients it takes up to 2-3 weeks to make a difference and it wears off with time. Sometimes it may wear off before it is time for the next injection. We still should wait till the next 3 monthly injection, because injecting too frequently may cause you to develop immunity to the botulinum toxin. We are looking for a reduction in the severity and/or frequency of your symptoms. As a reminder, side effects to look out for are (but not limited to): mouth dryness, dryness of the eyes, heaviness of your head or muscle weakness, including droopy face or droopy eyelid(s), rarely: speech or swallowing difficulties and very rarely: breathing difficulties. Some people have transient neck pain or soreness which typically responds to over-the-counter anti-inflammatory medication and local heat application with a heat pad. If you think you have a severe reaction to the botulinum toxin, such as weakness, trouble speaking, trouble breathing, or trouble swallowing, you have to call 911 or have someone take you to the nearest emergency room. However, most people have either no or minimal side effects from the injections. It is normal to have a little bit of redness and swelling around the injection sites which usually improves after a few hours. Rarely, there may be a bruise that improves on its own. Most side effects reported are very mild and resolve within 10-14 days. Please feel free to call us if you have any additional questions or concerns: [phone] or email us through My Chart. We may have to adjust the dose over time, depending on your results from this injection and your overall response over time to this medication.   

## 2017-09-25 NOTE — Progress Notes (Signed)
[name] is a 47 year old right-handed woman with an underlying medical history of allergic rhinitis, endometriosis, TMJ problems, and neck pain, as well as obesity, who presents for her second botulinum toxin injection for her intractable migraine headaches. The patient is unaccompanied today. I last saw her on 06/28/2017 for her initial Botox injection, at which time she received 155 units of Botox.   Today, 09/25/2017: She reports doing okay, no telltale response after the first injection but feels that in the past week or so she could tell that her headaches were more intense. She has experienced no side effects thankfully. She has been taking Maxalt as needed. She did take ibuprofen 800 mg some 4 days ago.  Written informed consent for recurrent, 3 monthly intramuscular injections with botulinum toxin for this indication has been obtained and scanned into the patient's electronic chart. I will re-consent if the type of botulinum toxin used or the indication for injection changes for this patient in the future. The patient is informed that we will use the same consent for her recurrent, most likely 3 monthly injections. She demonstrated understanding and voiced agreement.   I have previously talked to the patient in detail about expectations, limitations, benefits as well as potential adverse effects of botulinum toxin injections. The patient understands that the side effects include (but are not limited to): Mouth dryness, dryness of eyes, speech and swallowing difficulties, respiratory depression or problems breathing, weakness of muscles including more distant muscles than the ones injected, flu-like symptoms, myalgias, injection site reactions such as redness, itching, swelling, pain, and infection.   200 units of botulinum toxin type A were reconstituted using preservative-free normal saline to a concentration of 10 units per 0.1 mL and drawn up into 1 mL tuberculin syringes. Lot number was  [id number], expiration dates September 2021 for both 100 unit vials, Spec pharm.   O/E: BP (!) 137/93   Pulse 91   Ht 5' 3"  (1.6 m)   Wt 204 lb (92.5 kg)   BMI 36.14 kg/m    The patient was situated in a chair, sitting comfortably. After preparing the areas with 70% isopropyl alcohol and using a 26 gauge 1 1/2 inch hollow lumen recording EMG needle for the neck injections as well as a 30 gauge 1 inch needle for the facial injections, a total dose of 155 units of botulinum toxin type A in the form of Botox was injected into the muscles and the following distribution and quantities:   #1: 10 units on the right and 10 units in the left frontalis muscles, broken down in 2 sites on each side. #2: 5 units in the right and 5 units in the left corrugator muscles. #3: 15 units in the right and 15 units in the left occipitalis muscles, broken down in 3 sites on each side. #4: 20 units in the right and 20 units in the left temporalis muscles, broken down in 4 sites on each side.  #5: 15 units on the right and 15 units in the left upper trapezius muscles, broken down in 3 sites on each side. #6: 10 units in the right and 10 units in the left splenius capitis muscles, broken down in 2 sites on each side.  #7: 2.5 units in the right and 2.5 units in the left procerus muscles.   EMG guidance was utilized for the neck injections with mild EMG activity noted, especially in the left splenius capitis muscle.    A dose of 45 units out of  a total dose of 200 units was discarded as unavoidable waste.    The patient tolerated the procedure well without immediate complications. She was advised to make a followup appointment for repeat injections in 3 months from now and encouraged to call us with any interim questions, concerns, problems, or updates. She was in agreement and did not have any questions prior to leaving clinic today.   Previously:   She was seen by her primary care physician and received a Toradol  injection on 06/19/2017 as well as a prescription for Relpax.   04/25/17: Dr. Melton Alar retired. She reports a long-standing history of migraine headaches, she recalls she was about 47 years old when she started having migraines. For quite some time she had spontaneous improvement in her migraines but some 4 or 5 years ago started having more frequent migraines. She has tried and failed multiple preventative medications but does not have a list with her and prior records from Dr. Audery Amel office are not available today. She reports that his office is closed at this point. She did not get her records from his office. Perhaps her primary care provider has records. I encouraged her to try to obtain those records as it will help [name] manage her migraines optimally. She recalls having tried Topamax. She had hair loss from some medications but is not completely sure which ones. Topamax did not help, Zonegran caused her sedation, nortriptyline was also tried but she is unsure what happened with it. Amitriptyline she may have tried as well. She did not try Depakote. She is worried about sedation side effects as she also is going to school and has to be alert throughout the day. She takes occasional nausea medicine which likely is Zofran under the tongue. She has tried Maxalt under the tongue which is helpful at times. She needs a refill on that. She has regular eye exams because of her history of dry eyes. She used to wear a single contact but was advised to stop using it.  She has had 2 rounds of Botox injections, last time about 3 months ago as she recalls. She remembers doing okay with it. Prior to starting Botox injections she reports having about 18-20 headache days per month typically. She does not always have a typical aura but sometimes has visual blurriness and bringing in her ears to warn her about an upcoming migraine headache. She has occasional nausea that precedes the headache.   I saw her on 06/09/2015 at  which time she was referred is a new patient referral for a new problem, referred by her optometrist at the time for 1 month history of blurry vision. Her exam at the time was nonfocal and reassuringly she had no significant eye related findings. I suggested further workup in the form of visual evoked potentials, blood work, and she also reported a 6 month history of feeling tired. She had no one-sided weakness, tingling or numbness. Symptoms from the past which included paresthesias had resolved completely. She was under the care of Dr. Melton Alar for migraines. She was in the process of titrating Zonegran. She was on 225 mg each night. When she took 300 mg each night she felt too sleepy during the day. She denied any symptoms of sleep disordered breathing. She did admit not being a good sleeper. She did endorse stress as she was in school online for psychology and was also working off-and-on in Personal assistant. She reported not drinking sodas, not drinking alcohol and she reported not  smoking. She did report pain with eye movements and dry eyes.  She reported no family history of multiple sclerosis or lupus. She denied joint pain with the exception of bilateral knee pain and she also reported a 50 pound weight gain in the last year. She had visual evoked potentials on 07/06/2015: Impression: The visual evoked response test above was within normal limits bilaterally. No evidence of conduction slowing was seen within the anterior visual pathways on either side on today's evaluation.   We called her with her test results. Labs from 06/09/2015 showed normal A1c, normal vitamin D level, normal ANA, normal RF, normal ESR. CRP was elevated at 15.6. 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   She had a brain MRI with and without contrast on 06/23/2015:  IMPRESSION:  This is a normal MRI of the brain with and without contrast  In addition, personally reviewed the images through the PACS system. We called her with her test results.   I first met her on 05/22/2014 at the request of her neurosurgeon, Dr. Kathyrn Sheriff, at which time the patient reported intermittent right arm numbness, particularly with neck position changes. I suggested blood work and EMG and nerve conduction testing of the right upper extremity. Her blood work showed elevated B12 and B6 levels, indicative of B vitamin supplementation. Hemoglobin A1c was 5.7. We called her with her test results. She had EMG and nerve conduction testing on 06/02/2014: IMPRESSION:   Nerve conduction studies done on both upper extremities were unremarkable, without evidence of a neuropathy seen. EMG evaluation of the right upper extremity was unremarkable, without evidence of an overlying cervical radiculopathy. We called her with her test results. At the time, she reported improved symptoms.   05/22/2014: She has intermittent right arm numbness. Her symptoms have been going on for about 6 months. She has not noted any permanent numbness and no issues elsewhere. She does not have any significant weakness and sometimes feels weak when the numbness seems to come on. It goes away if she changes positions or adjusts her neck position. She has had right shoulder problems and pain in the right shoulder.   You saw her on 05/14/2014 for neck pain. She had undergone physical therapy without improvement of her neck pain. She had cervical epidural steroid injections which helped for about 24 hours as understand.   She had a C-spine MRI without contrast on 01/07/2014: Mild cervical spondylosis as described above without significant disc protrusion, foraminal stenosis or central canal stenosis.    Blood work from 05/14/2014 was reviewed: She had a BMP, CBC with differential, liver function panel, lipid panel and TSH all of  which were fine with the exception of a borderline LDL of 111.

## 2017-09-26 NOTE — Telephone Encounter (Signed)
I called and scheduled the patient for her next injection.  °

## 2017-09-27 DIAGNOSIS — M9901 Segmental and somatic dysfunction of cervical region: Secondary | ICD-10-CM | POA: Diagnosis not present

## 2017-09-27 DIAGNOSIS — M531 Cervicobrachial syndrome: Secondary | ICD-10-CM | POA: Diagnosis not present

## 2017-09-27 DIAGNOSIS — M9902 Segmental and somatic dysfunction of thoracic region: Secondary | ICD-10-CM | POA: Diagnosis not present

## 2017-09-27 DIAGNOSIS — M5022 Other cervical disc displacement, mid-cervical region, unspecified level: Secondary | ICD-10-CM | POA: Diagnosis not present

## 2017-09-28 DIAGNOSIS — H43393 Other vitreous opacities, bilateral: Secondary | ICD-10-CM | POA: Diagnosis not present

## 2017-09-28 DIAGNOSIS — H2513 Age-related nuclear cataract, bilateral: Secondary | ICD-10-CM | POA: Diagnosis not present

## 2017-09-28 DIAGNOSIS — H16223 Keratoconjunctivitis sicca, not specified as Sjogren's, bilateral: Secondary | ICD-10-CM | POA: Diagnosis not present

## 2017-09-28 DIAGNOSIS — H04123 Dry eye syndrome of bilateral lacrimal glands: Secondary | ICD-10-CM | POA: Diagnosis not present

## 2017-10-02 ENCOUNTER — Ambulatory Visit (INDEPENDENT_AMBULATORY_CARE_PROVIDER_SITE_OTHER): Payer: BLUE CROSS/BLUE SHIELD | Admitting: Family Medicine

## 2017-10-02 VITALS — BP 121/84 | HR 101 | Temp 99.1°F | Ht 63.0 in | Wt 200.0 lb

## 2017-10-02 DIAGNOSIS — E8881 Metabolic syndrome: Secondary | ICD-10-CM | POA: Diagnosis not present

## 2017-10-02 DIAGNOSIS — Z9189 Other specified personal risk factors, not elsewhere classified: Secondary | ICD-10-CM | POA: Diagnosis not present

## 2017-10-02 DIAGNOSIS — Z6835 Body mass index (BMI) 35.0-35.9, adult: Secondary | ICD-10-CM | POA: Diagnosis not present

## 2017-10-02 DIAGNOSIS — E559 Vitamin D deficiency, unspecified: Secondary | ICD-10-CM | POA: Diagnosis not present

## 2017-10-02 MED ORDER — VITAMIN D (ERGOCALCIFEROL) 1.25 MG (50000 UNIT) PO CAPS
50000.0000 [IU] | ORAL_CAPSULE | ORAL | 0 refills | Status: DC
Start: 2017-10-02 — End: 2017-10-23

## 2017-10-03 DIAGNOSIS — S93492A Sprain of other ligament of left ankle, initial encounter: Secondary | ICD-10-CM | POA: Diagnosis not present

## 2017-10-03 NOTE — Progress Notes (Signed)
Office: [phone]  /  Fax: [phone]   HPI:   Chief Complaint: OBESITY Morgan Long is here to discuss her progress with her obesity treatment plan. She is on the keep a food journal with 1200 to 1450 calories and 80+ grams of protein daily and is following her eating plan approximately 75 % of the time. She states she is walking for 30 minutes 2 times per week. [name] was changing to journaling and was able to start, but she is not journaling regularly yet. She is finishing her last two weeks of finals, and will then graduate in two weeks. Her weight is 200 lb (90.7 kg) today and has maintained weight over a period of 2 to 3 weeks since her last visit. She has lost 0 lbs since starting treatment with [name].  Vitamin D deficiency Morgan Long has a diagnosis of vitamin D deficiency. She is currently taking vit D prescription and is not yet at goal. Morgan Long denies nausea, vomiting or muscle weakness.  Insulin Resistance Morgan Long has a diagnosis of insulin resistance based on her elevated fasting insulin level >5. Although Morgan Long's blood glucose readings are still under good control, insulin resistance puts her at greater risk of metabolic syndrome and diabetes. She started taking metformin and struggles to remember to take it regularly. She continues to work on diet and exercise to decrease risk of diabetes. She denies nausea, vomiting or hypoglycemia.  At risk for diabetes Morgan Long is at higher than average risk for developing diabetes due to her obesity and insulin resistance. She currently denies polyuria or polydipsia.  ALLERGIES: Allergies  Allergen Reactions  . Acetaminophen Itching    Patient can take tylenol, just not tylenol#3  . Hydrocodone Nausea Only  . Oxycodone Itching  . Phosphate Itching    sick  . Codeine Itching and Nausea Only    MEDICATIONS: Current Outpatient Medications on File Prior to Visit  Medication Sig Dispense Refill  . beclomethasone (QVAR REDIHALER) 80 MCG/ACT inhaler Inhale 2  puffs 2 (two) times daily into the lungs. 10.6 g 5  . budesonide-formoterol (SYMBICORT) 160-4.5 MCG/ACT inhaler Inhale 2 puffs 2 (two) times daily into the lungs. 1 Inhaler 5  . cetirizine (ZYRTEC) 10 MG tablet Take 1 tablet (10 mg total) daily by mouth. 30 tablet 5  . cyclobenzaprine (FLEXERIL) 10 MG tablet Take 1 tablet (10 mg total) by mouth 3 (three) times daily as needed for muscle spasms. 30 tablet 0  . ESTROGEL 0.75 MG/1.25 GM (0.06%) topical gel Apply 1 application topically See admin instructions.  0  . etonogestrel-ethinyl estradiol (NUVARING) 0.12-0.015 MG/24HR vaginal ring Place 1 each vaginally every 28 (twenty-eight) days. Insert vaginally and leave in place for 3 consecutive weeks, then remove for 1 week.     Marland Kitchen ibuprofen (ADVIL,MOTRIN) 800 MG tablet Take 800 mg by mouth every 6 (six) hours as needed. for pain  3  . LINZESS 72 MCG capsule TAKE 1 CAPSULE (72 MCG TOTAL) BY MOUTH DAILY BEFORE BREAKFAST. 30 capsule 8  . metFORMIN (GLUCOPHAGE) 500 MG tablet Take 1 tablet (500 mg total) by mouth daily with breakfast. 30 tablet 0  . Multiple Vitamin (MULTIVITAMIN) LIQD Take 5 mLs by mouth daily.    Marland Kitchen omeprazole (PRILOSEC) 40 MG capsule Take 1 capsule (40 mg total) by mouth 2 (two) times daily. 60 capsule 3  . ondansetron (ZOFRAN-ODT) 4 MG disintegrating tablet TAKE 1-2 TABLETS BY MOUTH 3 TIMES A DAY AS NEEDED 16 tablet 1  . ranitidine (ZANTAC) 150 MG tablet Take  1 tablet (150 mg total) 2 (two) times daily by mouth. 60 tablet 5  . RESTASIS MULTIDOSE 0.05 % ophthalmic emulsion Place 1 drop into both eyes as needed.  3  . rizatriptan (MAXALT-MLT) 10 MG disintegrating tablet Take 1 tablet (10 mg total) by mouth as needed for migraine. May repeat in 2 hours if needed 9 tablet 11   No current facility-administered medications on file prior to visit.     PAST MEDICAL HISTORY: Past Medical History:  Diagnosis Date  . Allergic rhinitis   . Asthma   . Chronic migraine    Dr. Catalina Gravel  .  Constipation   . Dry eye syndrome of both lacrimal glands   . Dry skin   . Endometriosis   . Fatigue   . Gastroparesis   . GERD (gastroesophageal reflux disease)   . Headache(784.0)    occasional, dx w/ Migraines before, Topamax helps  . Heartburn   . History of stomach ulcers   . Lactose intolerance   . Localized edema   . Migraine   . Muscle weakness (generalized)   . Nuclear cataract of both eyes    Mild  . Overweight   . Pain in right ankle and joints of right foot   . Shortness of breath   . Sinus complaint   . Sinusitis   . Tired   . TMJ pain dysfunction syndrome    occasional    PAST SURGICAL HISTORY: Past Surgical History:  Procedure Laterality Date  . AUGMENTATION MAMMAPLASTY Bilateral 2006  . BREAST ENHANCEMENT SURGERY  2006  . BUNIONECTOMY    . COLONOSCOPY    . endrometroisis    . fallopian tube removed     Left  . UPPER GASTROINTESTINAL ENDOSCOPY    . wisdoim teeth extraction      SOCIAL HISTORY: Social History   Tobacco Use  . Smoking status: Never Smoker  . Smokeless tobacco: Never Used  Substance Use Topics  . Alcohol use: Yes    Alcohol/week: 0.0 oz    Comment: socially - occasional   . Drug use: No    FAMILY HISTORY: Family History  Problem Relation Age of Onset  . Throat cancer Mother   . Cancer - Other Mother 55       throat - died 33 months  . Cancer Mother   . Alcoholism Mother   . Heart disease Father   . Cancer Father   . Breast cancer Maternal Aunt        breast  . Colon cancer Maternal Uncle 17       died 82  . Breast cancer Maternal Aunt        breast  . Pancreatic cancer Maternal Aunt   . Diabetes Unknown        grandmother  . Hypertension Maternal Aunt        several family members  . Throat cancer Maternal Uncle   . Asthma Unknown        cousin, maternal  . Breast cancer Maternal Aunt        total of 5 aunts  . Breast cancer Maternal Aunt   . Colon cancer Paternal Grandfather   . Heart attack Neg Hx   .  Rectal cancer Neg Hx   . Stomach cancer Neg Hx     ROS: Review of Systems  Constitutional: Negative for weight loss.  Gastrointestinal: Negative for nausea and vomiting.  Genitourinary: Negative for frequency.  Musculoskeletal:  Negative for muscle weakness  Endo/Heme/Allergies: Negative for polydipsia.       Negative for hypoglycemia    PHYSICAL EXAM: Blood pressure 121/84, pulse (!) 101, temperature 99.1 F (37.3 C), temperature source Oral, height 5\' 3"  (1.6 m), weight 200 lb (90.7 kg), SpO2 95 %. Body mass index is 35.43 kg/m. Physical Exam  Constitutional: She is oriented to person, place, and time. She appears well-developed and well-nourished.  Cardiovascular: Normal rate.  Pulmonary/Chest: Effort normal.  Musculoskeletal: Normal range of motion.  Neurological: She is oriented to person, place, and time.  Skin: Skin is warm and dry.  Psychiatric: She has a normal mood and affect. Her behavior is normal.  Vitals reviewed.   RECENT LABS AND TESTS: BMET    Component Value Date/Time   NA 138 08/14/2017 1152   K 4.1 08/14/2017 1152   CL 102 08/14/2017 1152   CO2 22 08/14/2017 1152   GLUCOSE 86 08/14/2017 1152   GLUCOSE 89 02/27/2017 1552   BUN 13 08/14/2017 1152   CREATININE 0.76 08/14/2017 1152   CALCIUM 9.1 08/14/2017 1152   GFRNONAA 93 08/14/2017 1152   GFRAA 107 08/14/2017 1152   Lab Results  Component Value Date   HGBA1C 5.5 08/14/2017   HGBA1C 5.6 06/09/2015   HGBA1C 5.7 (H) 05/22/2014   Lab Results  Component Value Date   INSULIN 8.0 08/14/2017   CBC    Component Value Date/Time   WBC 9.0 08/14/2017 1152   WBC 9.7 03/30/2017 1512   RBC 4.26 08/14/2017 1152   RBC 4.48 03/30/2017 1512   HGB 12.3 08/14/2017 1152   HCT 38.6 08/14/2017 1152   PLT 416 (H) 03/30/2017 1512   MCV 91 08/14/2017 1152   MCH 28.9 08/14/2017 1152   MCH 28.6 03/30/2017 1512   MCHC 31.9 08/14/2017 1152   MCHC 32.5 03/30/2017 1512   RDW 13.7 08/14/2017 1152    LYMPHSABS 2.3 08/14/2017 1152   MONOABS 0.7 02/27/2017 1552   EOSABS 0.1 08/14/2017 1152   BASOSABS 0.0 08/14/2017 1152   Iron/TIBC/Ferritin/ %Sat No results found for: IRON, TIBC, FERRITIN, IRONPCTSAT Lipid Panel     Component Value Date/Time   CHOL 188 08/14/2017 1152   TRIG 92 08/14/2017 1152   HDL 71 08/14/2017 1152   CHOLHDL 3 05/14/2014 1223   VLDL 12.2 05/14/2014 1223   LDLCALC 99 08/14/2017 1152   Hepatic Function Panel     Component Value Date/Time   PROT 7.0 08/14/2017 1152   ALBUMIN 4.2 08/14/2017 1152   AST 17 08/14/2017 1152   ALT 18 08/14/2017 1152   ALKPHOS 73 08/14/2017 1152   BILITOT 0.2 08/14/2017 1152   BILIDIR 0.0 05/14/2014 1223      Component Value Date/Time   TSH 0.976 08/14/2017 1152   TSH 1.26 05/14/2014 1223   TSH 1.45 02/06/2014 1107   Results for FALICIA, LIZOTTE A (MRN [id number]) as of 10/03/2017 15:52  Ref. Range 08/14/2017 11:52  Vitamin D, 25-Hydroxy Latest Ref Range: 30.0 - 100.0 ng/mL 26.9 (L)   ASSESSMENT AND PLAN: Vitamin D deficiency - Plan: Vitamin D, Ergocalciferol, (DRISDOL) 50000 units CAPS capsule  Insulin resistance  At risk for diabetes mellitus  Class 2 severe obesity with serious comorbidity and body mass index (BMI) of 35.0 to 35.9 in adult, unspecified obesity type ([place])  PLAN:  Vitamin D Deficiency Morgan Long was informed that low vitamin D levels contributes to fatigue and are associated with obesity, breast, and colon cancer. She agrees to continue to take prescription  Vit D @50 ,000 IU every week #4 with no refills and will follow up for routine testing of vitamin D, at least 2-3 times per year. She was informed of the risk of over-replacement of vitamin D and agrees to not increase her dose unless she discusses this with [name] first. Carrye agrees to follow up with our clinic in 3 weeks.  Insulin Resistance Morgan Long will continue to work on weight loss, exercise, and decreasing simple carbohydrates in her diet to help decrease the  risk of diabetes. We dicussed metformin including benefits and risks. She was informed that eating too many simple carbohydrates or too many calories at one sitting increases the likelihood of GI side effects. Morgan Long agrees to continue metformin and diet prescription. We will recheck labs in 2 weeks and Morgan Long agreed to follow up with [name] as directed to monitor her progress.  Diabetes risk counseling Morgan Long was given extended (15 minutes) diabetes prevention counseling today. She is 47 y.o. female and has risk factors for diabetes including obesity and insulin resistance. We discussed intensive lifestyle modifications today with an emphasis on weight loss as well as increasing exercise and decreasing simple carbohydrates in her diet.  Obesity Morgan Long is currently in the action stage of change. As such, her goal is to maintain weight until graduation and then get back on track. She has agreed to keep a food journal with 1200 to 1450 calories and 80 grams of protein daily Morgan Long has been instructed to work up to a goal of 150 minutes of combined cardio and strengthening exercise per week for weight loss and overall health benefits. We discussed the following Behavioral Modification Strategies today: increasing lean protein intake, decreasing simple carbohydrates  and work on meal planning and easy cooking plans  Morgan Long has agreed to follow up with our clinic in 3 weeks. She was informed of the importance of frequent follow up visits to maximize her success with intensive lifestyle modifications for her multiple health conditions.   OBESITY BEHAVIORAL INTERVENTION VISIT  Today's visit was # 4 out of 22.  Starting weight: 200 lbs Starting date: 08/14/17 Today's weight : 200 lbs Today's date: 10/02/2017 Total lbs lost to date: 0 (Patients must lose 7 lbs in the first 6 months to continue with counseling)   ASK: We discussed the diagnosis of obesity with [place] today and Airel agreed to give [name]  permission to discuss obesity behavioral modification therapy today.  ASSESS: Morgan Long has the diagnosis of obesity and her BMI today is 35.44 [place] is in the action stage of change   ADVISE: Rashena was educated on the multiple health risks of obesity as well as the benefit of weight loss to improve her health. She was advised of the need for long term treatment and the importance of lifestyle modifications.  AGREE: Multiple dietary modification options and treatment options were discussed and  Nickola agreed to the above obesity treatment plan.  I, Doreene Nest, am acting as transcriptionist for Dennard Nip, MD  I have reviewed the above documentation for accuracy and completeness, and I agree with the above. -Dennard Nip, MD

## 2017-10-04 DIAGNOSIS — M5022 Other cervical disc displacement, mid-cervical region, unspecified level: Secondary | ICD-10-CM | POA: Diagnosis not present

## 2017-10-04 DIAGNOSIS — M9902 Segmental and somatic dysfunction of thoracic region: Secondary | ICD-10-CM | POA: Diagnosis not present

## 2017-10-04 DIAGNOSIS — M531 Cervicobrachial syndrome: Secondary | ICD-10-CM | POA: Diagnosis not present

## 2017-10-04 DIAGNOSIS — M9901 Segmental and somatic dysfunction of cervical region: Secondary | ICD-10-CM | POA: Diagnosis not present

## 2017-10-05 DIAGNOSIS — M9901 Segmental and somatic dysfunction of cervical region: Secondary | ICD-10-CM | POA: Diagnosis not present

## 2017-10-05 DIAGNOSIS — M531 Cervicobrachial syndrome: Secondary | ICD-10-CM | POA: Diagnosis not present

## 2017-10-05 DIAGNOSIS — M5022 Other cervical disc displacement, mid-cervical region, unspecified level: Secondary | ICD-10-CM | POA: Diagnosis not present

## 2017-10-05 DIAGNOSIS — M9902 Segmental and somatic dysfunction of thoracic region: Secondary | ICD-10-CM | POA: Diagnosis not present

## 2017-10-07 ENCOUNTER — Other Ambulatory Visit: Payer: Self-pay | Admitting: Allergy and Immunology

## 2017-10-07 DIAGNOSIS — J45901 Unspecified asthma with (acute) exacerbation: Secondary | ICD-10-CM

## 2017-10-09 DIAGNOSIS — M5022 Other cervical disc displacement, mid-cervical region, unspecified level: Secondary | ICD-10-CM | POA: Diagnosis not present

## 2017-10-09 DIAGNOSIS — M9901 Segmental and somatic dysfunction of cervical region: Secondary | ICD-10-CM | POA: Diagnosis not present

## 2017-10-09 DIAGNOSIS — M531 Cervicobrachial syndrome: Secondary | ICD-10-CM | POA: Diagnosis not present

## 2017-10-09 DIAGNOSIS — M9902 Segmental and somatic dysfunction of thoracic region: Secondary | ICD-10-CM | POA: Diagnosis not present

## 2017-10-11 DIAGNOSIS — M5022 Other cervical disc displacement, mid-cervical region, unspecified level: Secondary | ICD-10-CM | POA: Diagnosis not present

## 2017-10-11 DIAGNOSIS — M9901 Segmental and somatic dysfunction of cervical region: Secondary | ICD-10-CM | POA: Diagnosis not present

## 2017-10-11 DIAGNOSIS — M9902 Segmental and somatic dysfunction of thoracic region: Secondary | ICD-10-CM | POA: Diagnosis not present

## 2017-10-11 DIAGNOSIS — M531 Cervicobrachial syndrome: Secondary | ICD-10-CM | POA: Diagnosis not present

## 2017-10-12 ENCOUNTER — Other Ambulatory Visit (INDEPENDENT_AMBULATORY_CARE_PROVIDER_SITE_OTHER): Payer: Self-pay | Admitting: Family Medicine

## 2017-10-12 DIAGNOSIS — E8881 Metabolic syndrome: Secondary | ICD-10-CM

## 2017-10-15 DIAGNOSIS — H04123 Dry eye syndrome of bilateral lacrimal glands: Secondary | ICD-10-CM | POA: Diagnosis not present

## 2017-10-15 DIAGNOSIS — H16223 Keratoconjunctivitis sicca, not specified as Sjogren's, bilateral: Secondary | ICD-10-CM | POA: Diagnosis not present

## 2017-10-15 DIAGNOSIS — H43393 Other vitreous opacities, bilateral: Secondary | ICD-10-CM | POA: Diagnosis not present

## 2017-10-15 DIAGNOSIS — H2513 Age-related nuclear cataract, bilateral: Secondary | ICD-10-CM | POA: Diagnosis not present

## 2017-10-16 DIAGNOSIS — M9902 Segmental and somatic dysfunction of thoracic region: Secondary | ICD-10-CM | POA: Diagnosis not present

## 2017-10-16 DIAGNOSIS — M9901 Segmental and somatic dysfunction of cervical region: Secondary | ICD-10-CM | POA: Diagnosis not present

## 2017-10-16 DIAGNOSIS — L219 Seborrheic dermatitis, unspecified: Secondary | ICD-10-CM | POA: Diagnosis not present

## 2017-10-16 DIAGNOSIS — Z79899 Other long term (current) drug therapy: Secondary | ICD-10-CM | POA: Diagnosis not present

## 2017-10-16 DIAGNOSIS — M5022 Other cervical disc displacement, mid-cervical region, unspecified level: Secondary | ICD-10-CM | POA: Diagnosis not present

## 2017-10-16 DIAGNOSIS — M531 Cervicobrachial syndrome: Secondary | ICD-10-CM | POA: Diagnosis not present

## 2017-10-16 DIAGNOSIS — L700 Acne vulgaris: Secondary | ICD-10-CM | POA: Diagnosis not present

## 2017-10-18 DIAGNOSIS — M5022 Other cervical disc displacement, mid-cervical region, unspecified level: Secondary | ICD-10-CM | POA: Diagnosis not present

## 2017-10-18 DIAGNOSIS — M9902 Segmental and somatic dysfunction of thoracic region: Secondary | ICD-10-CM | POA: Diagnosis not present

## 2017-10-18 DIAGNOSIS — M9901 Segmental and somatic dysfunction of cervical region: Secondary | ICD-10-CM | POA: Diagnosis not present

## 2017-10-18 DIAGNOSIS — M531 Cervicobrachial syndrome: Secondary | ICD-10-CM | POA: Diagnosis not present

## 2017-10-22 DIAGNOSIS — M25372 Other instability, left ankle: Secondary | ICD-10-CM | POA: Diagnosis not present

## 2017-10-22 DIAGNOSIS — G8929 Other chronic pain: Secondary | ICD-10-CM | POA: Diagnosis not present

## 2017-10-22 DIAGNOSIS — S93492D Sprain of other ligament of left ankle, subsequent encounter: Secondary | ICD-10-CM | POA: Diagnosis not present

## 2017-10-22 DIAGNOSIS — M25571 Pain in right ankle and joints of right foot: Secondary | ICD-10-CM | POA: Diagnosis not present

## 2017-10-23 ENCOUNTER — Ambulatory Visit (INDEPENDENT_AMBULATORY_CARE_PROVIDER_SITE_OTHER): Payer: BLUE CROSS/BLUE SHIELD | Admitting: Family Medicine

## 2017-10-23 ENCOUNTER — Encounter (INDEPENDENT_AMBULATORY_CARE_PROVIDER_SITE_OTHER): Payer: Self-pay | Admitting: Family Medicine

## 2017-10-23 VITALS — BP 121/81 | HR 87 | Temp 98.3°F | Ht 63.0 in | Wt 201.0 lb

## 2017-10-23 DIAGNOSIS — E8881 Metabolic syndrome: Secondary | ICD-10-CM

## 2017-10-23 DIAGNOSIS — Z9189 Other specified personal risk factors, not elsewhere classified: Secondary | ICD-10-CM

## 2017-10-23 DIAGNOSIS — E559 Vitamin D deficiency, unspecified: Secondary | ICD-10-CM | POA: Diagnosis not present

## 2017-10-23 DIAGNOSIS — Z6835 Body mass index (BMI) 35.0-35.9, adult: Secondary | ICD-10-CM | POA: Diagnosis not present

## 2017-10-23 MED ORDER — METFORMIN HCL 500 MG PO TABS: 500.0000 mg | ORAL_TABLET | Freq: Every day | ORAL | 0 refills | Status: DC

## 2017-10-23 MED ORDER — NALTREXONE-BUPROPION HCL ER 8-90 MG PO TB12: 2.0000 | ORAL_TABLET | Freq: Two times a day (BID) | ORAL | 0 refills | Status: DC

## 2017-10-23 MED ORDER — VITAMIN D (ERGOCALCIFEROL) 1.25 MG (50000 UNIT) PO CAPS: 50000.0000 [IU] | ORAL_CAPSULE | ORAL | 0 refills | Status: DC

## 2017-10-24 DIAGNOSIS — M5022 Other cervical disc displacement, mid-cervical region, unspecified level: Secondary | ICD-10-CM | POA: Diagnosis not present

## 2017-10-24 DIAGNOSIS — M9902 Segmental and somatic dysfunction of thoracic region: Secondary | ICD-10-CM | POA: Diagnosis not present

## 2017-10-24 DIAGNOSIS — M531 Cervicobrachial syndrome: Secondary | ICD-10-CM | POA: Diagnosis not present

## 2017-10-24 DIAGNOSIS — M9901 Segmental and somatic dysfunction of cervical region: Secondary | ICD-10-CM | POA: Diagnosis not present

## 2017-10-25 DIAGNOSIS — N760 Acute vaginitis: Secondary | ICD-10-CM | POA: Diagnosis not present

## 2017-10-28 DIAGNOSIS — G8929 Other chronic pain: Secondary | ICD-10-CM | POA: Insufficient documentation

## 2017-11-01 ENCOUNTER — Encounter: Payer: Self-pay | Admitting: Family Medicine

## 2017-11-01 ENCOUNTER — Other Ambulatory Visit: Payer: Self-pay | Admitting: Family Medicine

## 2017-11-01 DIAGNOSIS — B001 Herpesviral vesicular dermatitis: Secondary | ICD-10-CM

## 2017-11-01 MED ORDER — VALACYCLOVIR HCL 1 G PO TABS: 1000.0000 mg | ORAL_TABLET | Freq: Three times a day (TID) | ORAL | 0 refills | Status: DC

## 2017-11-02 NOTE — Progress Notes (Signed)
Office: [phone]  /  Fax: [phone]   HPI:   Chief Complaint: OBESITY Morgan Long is here to discuss her progress with her obesity treatment plan. She is on the keep a food journal with 1200-1450 calories and 80 grams of protein daily and is following her eating plan approximately 60 % of the time. She states she is exercising 0 minutes 0 times per week. Morgan Long is not doing well with journaling, requests a medication to help her lose weight.  Her weight is 201 lb (91.2 kg) today and has gained 1 pound since her last visit. She has lost 0 lbs since starting treatment with [name].  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 Although Morgan Long's blood glucose readings are still under good control, Morgan resistance puts her at greater risk of metabolic syndrome and diabetes. She is stable on metformin, she denies nausea, vomiting, or hypoglycemia, following diet prescription well and continues to work on diet and exercise to decrease risk of diabetes.  At risk for diabetes Morgan Long is at higher than average risk for developing diabetes due to her obesity and Morgan resistance. She currently denies polyuria or polydipsia.  Vitamin D Deficiency Morgan Long has a diagnosis of vitamin D deficiency. She is stable on prescription Vit D. She notes no change in fatigue and denies nausea, vomiting or muscle weakness.  ALLERGIES: Allergies  Allergen Reactions  . Acetaminophen Itching    Patient can take tylenol, just not tylenol#3  . Hydrocodone Nausea Only  . Oxycodone Itching  . Phosphate Itching    sick  . Codeine Itching and Nausea Only    MEDICATIONS: Current Outpatient Medications on File Prior to Visit  Medication Sig Dispense Refill  . albuterol (PROAIR HFA) 108 (90 Base) MCG/ACT inhaler INHALE TWO PUFFS EVERY 4-6 HOURS IF NEEDED FOR COUGH WHEEZE. 8.5 g 0  . beclomethasone (QVAR REDIHALER) 80 MCG/ACT inhaler Inhale 2 puffs 2 (two) times  daily into the lungs. 10.6 g 5  . budesonide-formoterol (SYMBICORT) 160-4.5 MCG/ACT inhaler Inhale 2 puffs 2 (two) times daily into the lungs. 1 Inhaler 5  . cetirizine (ZYRTEC) 10 MG tablet Take 1 tablet (10 mg total) daily by mouth. 30 tablet 5  . cyclobenzaprine (FLEXERIL) 10 MG tablet Take 1 tablet (10 mg total) by mouth 3 (three) times daily as needed for muscle spasms. 30 tablet 0  . ESTROGEL 0.75 MG/1.25 GM (0.06%) topical gel Apply 1 application topically See admin instructions.  0  . etonogestrel-ethinyl estradiol (NUVARING) 0.12-0.015 MG/24HR vaginal ring Place 1 each vaginally every 28 (twenty-eight) days. Insert vaginally and leave in place for 3 consecutive weeks, then remove for 1 week.     Morgan Long ibuprofen (ADVIL,MOTRIN) 800 MG tablet Take 800 mg by mouth every 6 (six) hours as needed. for pain  3  . LINZESS 72 MCG capsule TAKE 1 CAPSULE (72 MCG TOTAL) BY MOUTH DAILY BEFORE BREAKFAST. 30 capsule 8  . Multiple Vitamin (MULTIVITAMIN) LIQD Take 5 mLs by mouth daily.    Morgan Long omeprazole (PRILOSEC) 40 MG capsule Take 1 capsule (40 mg total) by mouth 2 (two) times daily. 60 capsule 3  . ondansetron (ZOFRAN-ODT) 4 MG disintegrating tablet TAKE 1-2 TABLETS BY MOUTH 3 TIMES A DAY AS NEEDED 16 tablet 1  . ranitidine (ZANTAC) 150 MG tablet Take 1 tablet (150 mg total) 2 (two) times daily by mouth. 60 tablet 5  . RESTASIS MULTIDOSE 0.05 % ophthalmic emulsion Place 1 drop into both  eyes as needed.  3  . rizatriptan (MAXALT-MLT) 10 MG disintegrating tablet Take 1 tablet (10 mg total) by mouth as needed for migraine. May repeat in 2 hours if needed 9 tablet 11   No current facility-administered medications on file prior to visit.     PAST MEDICAL HISTORY: Past Medical History:  Diagnosis Date  . Allergic rhinitis   . Asthma   . Chronic migraine    Dr. Catalina Gravel  . Constipation   . Dry eye syndrome of both lacrimal glands   . Dry skin   . Endometriosis   . Fatigue   . Gastroparesis   . GERD  (gastroesophageal reflux disease)   . Headache(784.0)    occasional, dx w/ Migraines before, Topamax helps  . Heartburn   . History of stomach ulcers   . Lactose intolerance   . Localized edema   . Migraine   . Muscle weakness (generalized)   . Nuclear cataract of both eyes    Mild  . Overweight   . Pain in right ankle and joints of right foot   . Shortness of breath   . Sinus complaint   . Sinusitis   . Tired   . TMJ pain dysfunction syndrome    occasional    PAST SURGICAL HISTORY: Past Surgical History:  Procedure Laterality Date  . AUGMENTATION MAMMAPLASTY Bilateral 2006  . BREAST ENHANCEMENT SURGERY  2006  . BUNIONECTOMY    . COLONOSCOPY    . endrometroisis    . fallopian tube removed     Left  . UPPER GASTROINTESTINAL ENDOSCOPY    . wisdoim teeth extraction      SOCIAL HISTORY: Social History   Tobacco Use  . Smoking status: Never Smoker  . Smokeless tobacco: Never Used  Substance Use Topics  . Alcohol use: Yes    Alcohol/week: 0.0 oz    Comment: socially - occasional   . Drug use: No    FAMILY HISTORY: Family History  Problem Relation Age of Onset  . Throat cancer Mother   . Cancer - Other Mother 55       throat - died 58 months  . Cancer Mother   . Alcoholism Mother   . Heart disease Father   . Cancer Father   . Breast cancer Maternal Aunt        breast  . Colon cancer Maternal Uncle 71       died 37  . Breast cancer Maternal Aunt        breast  . Pancreatic cancer Maternal Aunt   . Diabetes Unknown        grandmother  . Hypertension Maternal Aunt        several family members  . Throat cancer Maternal Uncle   . Asthma Unknown        cousin, maternal  . Breast cancer Maternal Aunt        total of 5 aunts  . Breast cancer Maternal Aunt   . Colon cancer Paternal Grandfather   . Heart attack Neg Hx   . Rectal cancer Neg Hx   . Stomach cancer Neg Hx     ROS: Review of Systems  Constitutional: Positive for malaise/fatigue. Negative  for weight loss.  Gastrointestinal: Negative for nausea and vomiting.  Genitourinary: Negative for frequency.  Musculoskeletal:       Negative muscle weakness  Endo/Heme/Allergies: Negative for polydipsia.       Negative hypoglycemia    PHYSICAL EXAM: Blood pressure 121/81,  pulse 87, temperature 98.3 F (36.8 C), temperature source Oral, height 5\' 3"  (1.6 m), weight 201 lb (91.2 kg), last menstrual period 09/27/2017, SpO2 97 %. Body mass index is 35.61 kg/m. Physical Exam  Constitutional: She is oriented to person, place, and time. She appears well-developed and well-nourished.  Cardiovascular: Normal rate.  Pulmonary/Chest: Effort normal.  Musculoskeletal: Normal range of motion.  Neurological: She is oriented to person, place, and time.  Skin: Skin is warm and dry.  Psychiatric: She has a normal mood and affect. Her behavior is normal.  Vitals reviewed.   RECENT LABS AND TESTS: BMET    Component Value Date/Time   NA 138 08/14/2017 1152   K 4.1 08/14/2017 1152   CL 102 08/14/2017 1152   CO2 22 08/14/2017 1152   GLUCOSE 86 08/14/2017 1152   GLUCOSE 89 02/27/2017 1552   BUN 13 08/14/2017 1152   CREATININE 0.76 08/14/2017 1152   CALCIUM 9.1 08/14/2017 1152   GFRNONAA 93 08/14/2017 1152   GFRAA 107 08/14/2017 1152   Lab Results  Component Value Date   HGBA1C 5.5 08/14/2017   HGBA1C 5.6 06/09/2015   HGBA1C 5.7 (H) 05/22/2014   Lab Results  Component Value Date   Morgan 8.0 08/14/2017   CBC    Component Value Date/Time   WBC 9.0 08/14/2017 1152   WBC 9.7 03/30/2017 1512   RBC 4.26 08/14/2017 1152   RBC 4.48 03/30/2017 1512   HGB 12.3 08/14/2017 1152   HCT 38.6 08/14/2017 1152   PLT 416 (H) 03/30/2017 1512   MCV 91 08/14/2017 1152   MCH 28.9 08/14/2017 1152   MCH 28.6 03/30/2017 1512   MCHC 31.9 08/14/2017 1152   MCHC 32.5 03/30/2017 1512   RDW 13.7 08/14/2017 1152   LYMPHSABS 2.3 08/14/2017 1152   MONOABS 0.7 02/27/2017 1552   EOSABS 0.1 08/14/2017  1152   BASOSABS 0.0 08/14/2017 1152   Iron/TIBC/Ferritin/ %Sat No results found for: IRON, TIBC, FERRITIN, IRONPCTSAT Lipid Panel     Component Value Date/Time   CHOL 188 08/14/2017 1152   TRIG 92 08/14/2017 1152   HDL 71 08/14/2017 1152   CHOLHDL 3 05/14/2014 1223   VLDL 12.2 05/14/2014 1223   LDLCALC 99 08/14/2017 1152   Hepatic Function Panel     Component Value Date/Time   PROT 7.0 08/14/2017 1152   ALBUMIN 4.2 08/14/2017 1152   AST 17 08/14/2017 1152   ALT 18 08/14/2017 1152   ALKPHOS 73 08/14/2017 1152   BILITOT 0.2 08/14/2017 1152   BILIDIR 0.0 05/14/2014 1223      Component Value Date/Time   TSH 0.976 08/14/2017 1152   TSH 1.26 05/14/2014 1223   TSH 1.45 02/06/2014 1107  Results for CASSITY, CHRISTIAN A (MRN [id number]) as of 11/02/2017 08:42  Ref. Range 08/14/2017 11:52  Vitamin D, 25-Hydroxy Latest Ref Range: 30.0 - 100.0 ng/mL 26.9 (L)    ASSESSMENT AND PLAN: Morgan resistance - Plan: metFORMIN (GLUCOPHAGE) 500 MG tablet  Vitamin D deficiency - Plan: Vitamin D, Ergocalciferol, (DRISDOL) 50000 units CAPS capsule  At risk for diabetes mellitus  Class 2 severe obesity with serious comorbidity and body mass index (BMI) of 35.0 to 35.9 in adult, unspecified obesity type ([place]) - Plan: Naltrexone-buPROPion HCl ER (CONTRAVE) 8-90 MG TB12  PLAN:  Morgan Long will continue to work on weight loss, exercise, and decreasing simple carbohydrates in her diet to help decrease the risk of diabetes. We dicussed metformin including benefits and risks. She was informed that eating  too many simple carbohydrates or too many calories at one sitting increases the likelihood of GI side effects. Morgan Long agrees to continue taking 500 mg q AM #30 and we will refill for 1 month. Morgan Long agrees to follow up with our clinic in 2 to 3 weeks as directed to monitor her progress.  Diabetes risk counselling Morgan Long was given extended (15 minutes) diabetes prevention counseling today. She is 47  y.o. female and has risk factors for diabetes including obesity and Morgan resistance. We discussed intensive lifestyle modifications today with an emphasis on weight loss as well as increasing exercise and decreasing simple carbohydrates in her diet.  Vitamin D Deficiency Morgan Long was informed that low vitamin D levels contributes to fatigue and are associated with obesity, breast, and colon cancer. Morgan Long agrees to continue taking prescription Vit D @50 ,000 IU every week #4 and we will refill for 1 month. She will follow up for routine testing of vitamin D, at least 2-3 times per year. She was informed of the risk of over-replacement of vitamin D and agrees to not increase her dose unless she discusses this with [name] first. Be[place]ka agrees to follow up with our clinic in 2 to 3 weeks.  Obesity Morgan Long is currently in the action stage of change. As such, her goal is to continue with weight loss efforts She has agreed to keep a food journal with 1200-1450 calories and 80+ grams of protein daily Morgan Long has been instructed to work up to a goal of 150 minutes of combined cardio and strengthening exercise per week for weight loss and overall health benefits. We discussed the following Behavioral Modification Strategies today: increasing lean protein intake, decreasing simple carbohydrates, and no skipping meals  We discussed various medication options to help Morgan Long with her weight loss efforts and we both agreed to start Contrave 2 capsules PO BID #120 with no refills. Morgan Long was educated that Contrave will not make her lose weight but will make it easier to follow her plan. It is her diet prescription that causes weight loss.   Morgan Long has agreed to follow up with our clinic in 2 to 3 weeks. She was informed of the importance of frequent follow up visits to maximize her success with intensive lifestyle modifications for her multiple health conditions.   OBESITY BEHAVIORAL INTERVENTION VISIT  Today's visit was # 5 out of  22.  Starting weight: 200 lbs Starting date: 08/14/17 Today's weight : 201 lbs  Today's date: 10/23/2017 Total lbs lost to date: 0 (Patients must lose 7 lbs in the first 6 months to continue with counseling)   ASK: We discussed the diagnosis of obesity with Morgan Long today and Morgan Long agreed to give [name] permission to discuss obesity behavioral modification therapy today.  ASSESS: Morgan Long has the diagnosis of obesity and her BMI today is 35.61 Kimarie is in the action stage of change   ADVISE: Finesse was educated on the multiple health risks of obesity as well as the benefit of weight loss to improve her health. She was advised of the need for long term treatment and the importance of lifestyle modifications.  AGREE: Multiple dietary modification options and treatment options were discussed and  Calista agreed to the above obesity treatment plan.  I, Trixie Dredge, am acting as transcriptionist for Dennard Nip, MD  I have reviewed the above documentation for accuracy and completeness, and I agree with the above. -Dennard Nip, MD

## 2017-11-03 DIAGNOSIS — S93492A Sprain of other ligament of left ankle, initial encounter: Secondary | ICD-10-CM | POA: Diagnosis not present

## 2017-11-07 ENCOUNTER — Encounter: Payer: Self-pay | Admitting: Internal Medicine

## 2017-11-07 ENCOUNTER — Ambulatory Visit (INDEPENDENT_AMBULATORY_CARE_PROVIDER_SITE_OTHER): Payer: BLUE CROSS/BLUE SHIELD | Admitting: Family Medicine

## 2017-11-07 VITALS — BP 118/79 | HR 89 | Temp 97.9°F | Ht 63.0 in | Wt 201.0 lb

## 2017-11-07 DIAGNOSIS — E559 Vitamin D deficiency, unspecified: Secondary | ICD-10-CM

## 2017-11-07 DIAGNOSIS — E8881 Metabolic syndrome: Secondary | ICD-10-CM

## 2017-11-07 DIAGNOSIS — Z9189 Other specified personal risk factors, not elsewhere classified: Secondary | ICD-10-CM | POA: Diagnosis not present

## 2017-11-07 DIAGNOSIS — Z6835 Body mass index (BMI) 35.0-35.9, adult: Secondary | ICD-10-CM

## 2017-11-07 DIAGNOSIS — K219 Gastro-esophageal reflux disease without esophagitis: Secondary | ICD-10-CM

## 2017-11-07 MED ORDER — OMEPRAZOLE 40 MG PO CPDR: 40.0000 mg | DELAYED_RELEASE_CAPSULE | Freq: Two times a day (BID) | ORAL | 3 refills | Status: DC

## 2017-11-07 MED ORDER — LORCASERIN HCL 10 MG PO TABS: 1.0000 | ORAL_TABLET | Freq: Two times a day (BID) | ORAL | 0 refills | Status: DC

## 2017-11-07 MED ORDER — METFORMIN HCL 500 MG PO TABS: 500.0000 mg | ORAL_TABLET | Freq: Every day | ORAL | 0 refills | Status: DC

## 2017-11-07 MED ORDER — VITAMIN D (ERGOCALCIFEROL) 1.25 MG (50000 UNIT) PO CAPS: 50000.0000 [IU] | ORAL_CAPSULE | ORAL | 0 refills | Status: DC

## 2017-11-08 ENCOUNTER — Encounter (INDEPENDENT_AMBULATORY_CARE_PROVIDER_SITE_OTHER): Payer: Self-pay | Admitting: Family Medicine

## 2017-11-08 ENCOUNTER — Other Ambulatory Visit: Payer: Self-pay

## 2017-11-08 DIAGNOSIS — M531 Cervicobrachial syndrome: Secondary | ICD-10-CM | POA: Diagnosis not present

## 2017-11-08 DIAGNOSIS — M9902 Segmental and somatic dysfunction of thoracic region: Secondary | ICD-10-CM | POA: Diagnosis not present

## 2017-11-08 DIAGNOSIS — M5022 Other cervical disc displacement, mid-cervical region, unspecified level: Secondary | ICD-10-CM | POA: Diagnosis not present

## 2017-11-08 DIAGNOSIS — M9901 Segmental and somatic dysfunction of cervical region: Secondary | ICD-10-CM | POA: Diagnosis not present

## 2017-11-08 MED ORDER — OMEPRAZOLE 40 MG PO CPDR: 40.0000 mg | DELAYED_RELEASE_CAPSULE | Freq: Every day | ORAL | 0 refills | Status: DC

## 2017-11-08 MED ORDER — METOCLOPRAMIDE HCL 10 MG PO TABS: 10.0000 mg | ORAL_TABLET | Freq: Four times a day (QID) | ORAL | 1 refills | Status: DC

## 2017-11-08 NOTE — Telephone Encounter (Signed)
Patient is describing gastroparesis symptoms given that she is out of domperidone Okay for short-term use of metoclopramide 5 to 10 mg 3 times daily before meals and at bedtime while out of domperidone If she can get domperidone she can resume at previous dose I am okay with trial of once daily PPI (omeprazole) if symptoms not improved with metoclopramide or domperidone

## 2017-11-08 NOTE — Progress Notes (Signed)
Office: [phone]  /  Fax: [phone]   HPI:   Chief Complaint: OBESITY Morgan Long is here to discuss her progress with her obesity treatment plan. She is on the keep a food journal with 1200 to 1400 calories and 80+ grams of protein daily and is following her eating plan approximately 80 % of the time. She states she is doing stretching and arm weights for 30 minutes 2 times per week. Morgan Long was started on Contrave, but insurance wouldn't cover. And has a stop edit to start [place] first. Patient is struggling to journal and feels a pill would help her. Her weight is 201 lb (91.2 kg) today and has maintained weight over a period of 2 weeks since her last visit. She has gained 1 lb since starting treatment with [name].  Insulin Resistance [name] has a diagnosis of insulin resistance based on her elevated fasting insulin level >5. Although Morgan Long's blood glucose readings are still under good control, insulin resistance puts her at greater risk of metabolic syndrome and diabetes. Morgan Long is not following her diet prescription closely, but she is trying to decrease simple carbohydrates. She is stable on metformin and she continues to work on diet and exercise to decrease risk of diabetes. Morgan Long denies nausea, vomiting or hypoglycemia.  At risk for diabetes Morgan Long is at higher than average risk for developing diabetes due to her obesity and insulin resistance. She currently denies polyuria or polydipsia.  Vitamin D deficiency Morgan Long has a diagnosis of vitamin D deficiency. She is stable on vit D but she is not yet at goal. Morgan Long denies nausea, vomiting or muscle weakness.  GERD Morgan Long has a diagnosis of gastroesophageal reflux disease and notes, symptoms worsen when she is off of her proton pump inhibitor.  ALLERGIES: Allergies  Allergen Reactions  . Acetaminophen Itching    Patient can take tylenol, just not tylenol#3  . Hydrocodone Nausea Only  . Oxycodone Itching  . Phosphate Itching    sick  . Codeine  Itching and Nausea Only    MEDICATIONS: Current Outpatient Medications on File Prior to Visit  Medication Sig Dispense Refill  . albuterol (PROAIR HFA) 108 (90 Base) MCG/ACT inhaler INHALE TWO PUFFS EVERY 4-6 HOURS IF NEEDED FOR COUGH WHEEZE. 8.5 g 0  . beclomethasone (QVAR REDIHALER) 80 MCG/ACT inhaler Inhale 2 puffs 2 (two) times daily into the lungs. 10.6 g 5  . budesonide-formoterol (SYMBICORT) 160-4.5 MCG/ACT inhaler Inhale 2 puffs 2 (two) times daily into the lungs. 1 Inhaler 5  . cetirizine (ZYRTEC) 10 MG tablet Take 1 tablet (10 mg total) daily by mouth. 30 tablet 5  . cyclobenzaprine (FLEXERIL) 10 MG tablet Take 1 tablet (10 mg total) by mouth 3 (three) times daily as needed for muscle spasms. 30 tablet 0  . ESTROGEL 0.75 MG/1.25 GM (0.06%) topical gel Apply 1 application topically See admin instructions.  0  . etonogestrel-ethinyl estradiol (NUVARING) 0.12-0.015 MG/24HR vaginal ring Place 1 each vaginally every 28 (twenty-eight) days. Insert vaginally and leave in place for 3 consecutive weeks, then remove for 1 week.     Marland Kitchen ibuprofen (ADVIL,MOTRIN) 800 MG tablet Take 800 mg by mouth every 6 (six) hours as needed. for pain  3  . LINZESS 72 MCG capsule TAKE 1 CAPSULE (72 MCG TOTAL) BY MOUTH DAILY BEFORE BREAKFAST. 30 capsule 8  . Multiple Vitamin (MULTIVITAMIN) LIQD Take 5 mLs by mouth daily.    . Naltrexone-buPROPion HCl ER (CONTRAVE) 8-90 MG TB12 Take 2 capsules by mouth 2 (two) times  daily. 120 tablet 0  . ondansetron (ZOFRAN-ODT) 4 MG disintegrating tablet TAKE 1-2 TABLETS BY MOUTH 3 TIMES A DAY AS NEEDED 16 tablet 1  . ranitidine (ZANTAC) 150 MG tablet Take 1 tablet (150 mg total) 2 (two) times daily by mouth. 60 tablet 5  . RESTASIS MULTIDOSE 0.05 % ophthalmic emulsion Place 1 drop into both eyes as needed.  3  . rizatriptan (MAXALT-MLT) 10 MG disintegrating tablet Take 1 tablet (10 mg total) by mouth as needed for migraine. May repeat in 2 hours if needed 9 tablet 11  .  valACYclovir (VALTREX) 1000 MG tablet Take 1 tablet (1,000 mg total) by mouth 3 (three) times daily. 21 tablet 0   No current facility-administered medications on file prior to visit.     PAST MEDICAL HISTORY: Past Medical History:  Diagnosis Date  . Allergic rhinitis   . Asthma   . Chronic migraine    Dr. Catalina Gravel  . Constipation   . Dry eye syndrome of both lacrimal glands   . Dry skin   . Endometriosis   . Fatigue   . Gastroparesis   . GERD (gastroesophageal reflux disease)   . Headache(784.0)    occasional, dx w/ Migraines before, Topamax helps  . Heartburn   . History of stomach ulcers   . Lactose intolerance   . Localized edema   . Migraine   . Muscle weakness (generalized)   . Nuclear cataract of both eyes    Mild  . Overweight   . Pain in right ankle and joints of right foot   . Shortness of breath   . Sinus complaint   . Sinusitis   . Tired   . TMJ pain dysfunction syndrome    occasional    PAST SURGICAL HISTORY: Past Surgical History:  Procedure Laterality Date  . AUGMENTATION MAMMAPLASTY Bilateral 2006  . BREAST ENHANCEMENT SURGERY  2006  . BUNIONECTOMY    . COLONOSCOPY    . endrometroisis    . fallopian tube removed     Left  . UPPER GASTROINTESTINAL ENDOSCOPY    . [place] teeth extraction      SOCIAL HISTORY: Social History   Tobacco Use  . Smoking status: Never Smoker  . Smokeless tobacco: Never Used  Substance Use Topics  . Alcohol use: Yes    Alcohol/week: 0.0 oz    Comment: socially - occasional   . Drug use: No    FAMILY HISTORY: Family History  Problem Relation Age of Onset  . Throat cancer Mother   . Cancer - Other Mother 55       throat - died 77 months  . Cancer Mother   . Alcoholism Mother   . Heart disease Father   . Cancer Father   . Breast cancer Maternal Aunt        breast  . Colon cancer Maternal Uncle 65       died 78  . Breast cancer Maternal Aunt        breast  . Pancreatic cancer Maternal Aunt   . Diabetes  Unknown        grandmother  . Hypertension Maternal Aunt        several family members  . Throat cancer Maternal Uncle   . Asthma Unknown        cousin, maternal  . Breast cancer Maternal Aunt        total of 5 aunts  . Breast cancer Maternal Aunt   . Colon cancer Paternal  Grandfather   . Heart attack Neg Hx   . Rectal cancer Neg Hx   . Stomach cancer Neg Hx     ROS: Review of Systems  Constitutional: Negative for weight loss.  Gastrointestinal: Negative for nausea and vomiting.  Genitourinary: Negative for frequency.  Musculoskeletal:       Negative for muscle weakness  Endo/Heme/Allergies: Negative for polydipsia.       Negative for hypoglycemia    PHYSICAL EXAM: Blood pressure 118/79, pulse 89, temperature 97.9 F (36.6 C), temperature source Oral, height 5\' 3"  (1.6 m), weight 201 lb (91.2 kg), SpO2 98 %. Body mass index is 35.61 kg/m. Physical Exam  Constitutional: She is oriented to person, place, and time. She appears well-developed and well-nourished.  Cardiovascular: Normal rate.  Pulmonary/Chest: Effort normal.  Musculoskeletal: Normal range of motion.  Neurological: She is oriented to person, place, and time.  Skin: Skin is warm and dry.  Psychiatric: She has a normal mood and affect.  Vitals reviewed.   RECENT LABS AND TESTS: BMET    Component Value Date/Time   NA 138 08/14/2017 1152   K 4.1 08/14/2017 1152   CL 102 08/14/2017 1152   CO2 22 08/14/2017 1152   GLUCOSE 86 08/14/2017 1152   GLUCOSE 89 02/27/2017 1552   BUN 13 08/14/2017 1152   CREATININE 0.76 08/14/2017 1152   CALCIUM 9.1 08/14/2017 1152   GFRNONAA 93 08/14/2017 1152   GFRAA 107 08/14/2017 1152   Lab Results  Component Value Date   HGBA1C 5.5 08/14/2017   HGBA1C 5.6 06/09/2015   HGBA1C 5.7 (H) 05/22/2014   Lab Results  Component Value Date   INSULIN 8.0 08/14/2017   CBC    Component Value Date/Time   WBC 9.0 08/14/2017 1152   WBC 9.7 03/30/2017 1512   RBC 4.26  08/14/2017 1152   RBC 4.48 03/30/2017 1512   HGB 12.3 08/14/2017 1152   HCT 38.6 08/14/2017 1152   PLT 416 (H) 03/30/2017 1512   MCV 91 08/14/2017 1152   MCH 28.9 08/14/2017 1152   MCH 28.6 03/30/2017 1512   MCHC 31.9 08/14/2017 1152   MCHC 32.5 03/30/2017 1512   RDW 13.7 08/14/2017 1152   LYMPHSABS 2.3 08/14/2017 1152   MONOABS 0.7 02/27/2017 1552   EOSABS 0.1 08/14/2017 1152   BASOSABS 0.0 08/14/2017 1152   Iron/TIBC/Ferritin/ %Sat No results found for: IRON, TIBC, FERRITIN, IRONPCTSAT Lipid Panel     Component Value Date/Time   CHOL 188 08/14/2017 1152   TRIG 92 08/14/2017 1152   HDL 71 08/14/2017 1152   CHOLHDL 3 05/14/2014 1223   VLDL 12.2 05/14/2014 1223   LDLCALC 99 08/14/2017 1152   Hepatic Function Panel     Component Value Date/Time   PROT 7.0 08/14/2017 1152   ALBUMIN 4.2 08/14/2017 1152   AST 17 08/14/2017 1152   ALT 18 08/14/2017 1152   ALKPHOS 73 08/14/2017 1152   BILITOT 0.2 08/14/2017 1152   BILIDIR 0.0 05/14/2014 1223      Component Value Date/Time   TSH 0.976 08/14/2017 1152   TSH 1.26 05/14/2014 1223   TSH 1.45 02/06/2014 1107   Results for CARLING, LIBERMAN A (MRN [id number]) as of 11/08/2017 07:52  Ref. Range 08/14/2017 11:52  Vitamin D, 25-Hydroxy Latest Ref Range: 30.0 - 100.0 ng/mL 26.9 (L)   ASSESSMENT AND PLAN: Insulin resistance - Plan: metFORMIN (GLUCOPHAGE) 500 MG tablet  Vitamin D deficiency - Plan: Vitamin D, Ergocalciferol, (DRISDOL) 50000 units CAPS capsule  Gastroesophageal reflux disease,  esophagitis presence not specified - Plan: omeprazole (PRILOSEC) 40 MG capsule  At risk for diabetes mellitus  Class 2 severe obesity with serious comorbidity and body mass index (BMI) of 35.0 to 35.9 in adult, unspecified obesity type ([place]) - Plan: Lorcaserin HCl (BELVIQ) 10 MG TABS  PLAN:  Insulin Resistance Jniya will continue to work on weight loss, exercise, and decreasing simple carbohydrates in her diet to help decrease the risk of  diabetes. We dicussed metformin including benefits and risks. She was informed that eating too many simple carbohydrates or too many calories at one sitting increases the likelihood of GI side effects. Antasia requested metformin for now and prescription was written today for 1 month refill. Shatoria agreed to follow up with [name] as directed to monitor her progress.  Diabetes risk counseling Avital was given extended (15 minutes) diabetes prevention counseling today. She is 47 y.o. female and has risk factors for diabetes including obesity and insulin resistance. We discussed intensive lifestyle modifications today with an emphasis on weight loss as well as increasing exercise and decreasing simple carbohydrates in her diet.  Vitamin D Deficiency Chevie was informed that low vitamin D levels contributes to fatigue and are associated with obesity, breast, and colon cancer. She agrees to continue to take prescription Vit D @50 ,000 IU every week #4 with no refills and will follow up for routine testing of vitamin D, at least 2-3 times per year. She was informed of the risk of over-replacement of vitamin D and agrees to not increase her dose unless she discusses this with [name] first. Nubia agrees to follow up as directed.  GERD Suzzanne agrees to start omeprazole 40 mg qd #30 with no refills and follow up with our clinic in 3 to 4 weeks.  Obesity Tyson is currently in the action stage of change. As such, her goal is to continue with weight loss efforts She has agreed to keep a food journal with 1200 to 1400 calories and 80+ grams of protein daily Chanae has been instructed to work up to a goal of 150 minutes of combined cardio and strengthening exercise per week for weight loss and overall health benefits. We discussed the following Behavioral Modification Strategies today: increasing lean protein intake and decreasing simple carbohydrates   We discussed various medication options to help Cola with her weight loss efforts and  we both agreed to start Belviq 10 mg bid #60 with no refills.  Cassia has agreed to follow up with our clinic in 3 to 4 weeks. She was informed of the importance of frequent follow up visits to maximize her success with intensive lifestyle modifications for her multiple health conditions.   OBESITY BEHAVIORAL INTERVENTION VISIT  Today's visit was # 6 out of 22.  Starting weight: 200 lbs Starting date: 08/14/17 Today's weight : 201 lbs  Today's date: 11/07/2017 Total lbs lost to date: 0 (Patients must lose 7 lbs in the first 6 months to continue with counseling)   ASK: We discussed the diagnosis of obesity with [place] today and Arienna agreed to give [name] permission to discuss obesity behavioral modification therapy today.  ASSESS: Jolaine has the diagnosis of obesity and her BMI today is 35.61 Ulani is in the action stage of change   ADVISE: Lavren was educated on the multiple health risks of obesity as well as the benefit of weight loss to improve her health. She was advised of the need for long term treatment and the importance of lifestyle modifications.  AGREE: Multiple dietary modification options and treatment options were discussed and  Jahyra agreed to the above obesity treatment plan.  I, Doreene Nest, am acting as transcriptionist for Dennard Nip, MD  I have reviewed the above documentation for accuracy and completeness, and I agree with the above. -Dennard Nip, MD

## 2017-11-09 ENCOUNTER — Other Ambulatory Visit: Payer: Self-pay | Admitting: Allergy and Immunology

## 2017-11-09 DIAGNOSIS — J45901 Unspecified asthma with (acute) exacerbation: Secondary | ICD-10-CM

## 2017-11-12 NOTE — Telephone Encounter (Signed)
Can you take care of this?

## 2017-11-15 DIAGNOSIS — R2689 Other abnormalities of gait and mobility: Secondary | ICD-10-CM | POA: Diagnosis not present

## 2017-11-15 DIAGNOSIS — G8929 Other chronic pain: Secondary | ICD-10-CM | POA: Diagnosis not present

## 2017-11-15 DIAGNOSIS — M65872 Other synovitis and tenosynovitis, left ankle and foot: Secondary | ICD-10-CM | POA: Diagnosis not present

## 2017-11-15 DIAGNOSIS — M65871 Other synovitis and tenosynovitis, right ankle and foot: Secondary | ICD-10-CM | POA: Diagnosis not present

## 2017-11-15 DIAGNOSIS — M25572 Pain in left ankle and joints of left foot: Secondary | ICD-10-CM | POA: Diagnosis not present

## 2017-11-15 DIAGNOSIS — M25571 Pain in right ankle and joints of right foot: Secondary | ICD-10-CM | POA: Diagnosis not present

## 2017-11-16 DIAGNOSIS — M25372 Other instability, left ankle: Secondary | ICD-10-CM | POA: Diagnosis not present

## 2017-11-16 DIAGNOSIS — S93492A Sprain of other ligament of left ankle, initial encounter: Secondary | ICD-10-CM | POA: Diagnosis not present

## 2017-11-16 DIAGNOSIS — M25572 Pain in left ankle and joints of left foot: Secondary | ICD-10-CM | POA: Diagnosis not present

## 2017-11-16 DIAGNOSIS — X58XXXA Exposure to other specified factors, initial encounter: Secondary | ICD-10-CM | POA: Diagnosis not present

## 2017-11-16 HISTORY — PX: ANKLE SURGERY: SHX546

## 2017-11-20 DIAGNOSIS — S93492D Sprain of other ligament of left ankle, subsequent encounter: Secondary | ICD-10-CM | POA: Diagnosis not present

## 2017-12-03 DIAGNOSIS — S93492A Sprain of other ligament of left ankle, initial encounter: Secondary | ICD-10-CM | POA: Diagnosis not present

## 2017-12-05 ENCOUNTER — Ambulatory Visit (INDEPENDENT_AMBULATORY_CARE_PROVIDER_SITE_OTHER): Payer: BLUE CROSS/BLUE SHIELD | Admitting: Family Medicine

## 2017-12-05 VITALS — BP 111/67 | HR 97 | Temp 98.3°F | Ht 63.0 in | Wt 201.0 lb

## 2017-12-05 DIAGNOSIS — Z9189 Other specified personal risk factors, not elsewhere classified: Secondary | ICD-10-CM | POA: Diagnosis not present

## 2017-12-05 DIAGNOSIS — S93492D Sprain of other ligament of left ankle, subsequent encounter: Secondary | ICD-10-CM | POA: Diagnosis not present

## 2017-12-05 DIAGNOSIS — Z6835 Body mass index (BMI) 35.0-35.9, adult: Secondary | ICD-10-CM

## 2017-12-05 DIAGNOSIS — E559 Vitamin D deficiency, unspecified: Secondary | ICD-10-CM | POA: Diagnosis not present

## 2017-12-05 MED ORDER — VITAMIN D (ERGOCALCIFEROL) 1.25 MG (50000 UNIT) PO CAPS: 50000.0000 [IU] | ORAL_CAPSULE | ORAL | 0 refills | Status: DC

## 2017-12-05 MED ORDER — NALTREXONE-BUPROPION HCL ER 8-90 MG PO TB12: 2.0000 | ORAL_TABLET | Freq: Two times a day (BID) | ORAL | 0 refills | Status: DC

## 2017-12-05 NOTE — Progress Notes (Signed)
Office: [phone]  /  Fax: [phone]   HPI:   Chief Complaint: OBESITY Morgan Long is here to discuss her progress with her obesity treatment plan. She is on the keep a food journal with 1200-1400 calories and 80+ grams of protein daily and is following her eating plan approximately 75 % of the time. She states she is exercising 0 minutes 0 times per week. Morgan Long started Belviq but doesn't feel it is helping with weight loss. She is journaling on and off and working on increasing protein. She notes some family sabotage and struggling with meal prep since her ankle surgery.  Her weight is 201 lb (91.2 kg) today and has not lost weight since her last visit. She has lost 0 lbs since starting treatment with [name].  Vitamin D Deficiency Morgan Long has a diagnosis of vitamin D deficiency. She is stable on prescription Vit D, not yet at goal. She denies nausea, vomiting or muscle weakness.  At risk for osteopenia and osteoporosis Morgan Long is at higher risk of osteopenia and osteoporosis due to vitamin D deficiency.   ALLERGIES: Allergies  Allergen Reactions  . Acetaminophen Itching    Patient can take tylenol, just not tylenol#3  . Hydrocodone Nausea Only  . Oxycodone Itching  . Phosphate Itching    sick  . Codeine Itching and Nausea Only    MEDICATIONS: Current Outpatient Medications on File Prior to Visit  Medication Sig Dispense Refill  . albuterol (PROAIR HFA) 108 (90 Base) MCG/ACT inhaler INHALE TWO PUFFS EVERY 4-6 HOURS IF NEEDED FOR COUGH WHEEZE. 8.5 Inhaler 0  . beclomethasone (QVAR REDIHALER) 80 MCG/ACT inhaler Inhale 2 puffs 2 (two) times daily into the lungs. 10.6 g 5  . budesonide-formoterol (SYMBICORT) 160-4.5 MCG/ACT inhaler Inhale 2 puffs 2 (two) times daily into the lungs. 1 Inhaler 5  . cetirizine (ZYRTEC) 10 MG tablet Take 1 tablet (10 mg total) daily by mouth. 30 tablet 5  . cyclobenzaprine (FLEXERIL) 10 MG tablet Take 1 tablet (10 mg total) by mouth 3 (three) times daily as needed  for muscle spasms. 30 tablet 0  . ESTROGEL 0.75 MG/1.25 GM (0.06%) topical gel Apply 1 application topically See admin instructions.  0  . etonogestrel-ethinyl estradiol (NUVARING) 0.12-0.015 MG/24HR vaginal ring Place 1 each vaginally every 28 (twenty-eight) days. Insert vaginally and leave in place for 3 consecutive weeks, then remove for 1 week.     Marland Kitchen ibuprofen (ADVIL,MOTRIN) 800 MG tablet Take 800 mg by mouth every 6 (six) hours as needed. for pain  3  . LINZESS 72 MCG capsule TAKE 1 CAPSULE (72 MCG TOTAL) BY MOUTH DAILY BEFORE BREAKFAST. 30 capsule 8  . metFORMIN (GLUCOPHAGE) 500 MG tablet Take 1 tablet (500 mg total) by mouth daily with breakfast. 30 tablet 0  . metoCLOPramide (REGLAN) 10 MG tablet Take 1 tablet (10 mg total) by mouth 4 (four) times daily. 120 tablet 1  . Multiple Vitamin (MULTIVITAMIN) LIQD Take 5 mLs by mouth daily.    Marland Kitchen omeprazole (PRILOSEC) 40 MG capsule Take 1 capsule (40 mg total) by mouth daily. 30 capsule 0  . ondansetron (ZOFRAN-ODT) 4 MG disintegrating tablet TAKE 1-2 TABLETS BY MOUTH 3 TIMES A DAY AS NEEDED 16 tablet 1  . ranitidine (ZANTAC) 150 MG tablet Take 1 tablet (150 mg total) 2 (two) times daily by mouth. 60 tablet 5  . RESTASIS MULTIDOSE 0.05 % ophthalmic emulsion Place 1 drop into both eyes as needed.  3  . rizatriptan (MAXALT-MLT) 10 MG disintegrating tablet Take  1 tablet (10 mg total) by mouth as needed for migraine. May repeat in 2 hours if needed 9 tablet 11  . valACYclovir (VALTREX) 1000 MG tablet Take 1 tablet (1,000 mg total) by mouth 3 (three) times daily. 21 tablet 0   No current facility-administered medications on file prior to visit.     PAST MEDICAL HISTORY: Past Medical History:  Diagnosis Date  . Allergic rhinitis   . Asthma   . Chronic migraine    Dr. Catalina Gravel  . Constipation   . Dry eye syndrome of both lacrimal glands   . Dry skin   . Endometriosis   . Fatigue   . Gastroparesis   . GERD (gastroesophageal reflux disease)   .  Headache(784.0)    occasional, dx w/ Migraines before, Topamax helps  . Heartburn   . History of stomach ulcers   . Lactose intolerance   . Localized edema   . Migraine   . Muscle weakness (generalized)   . Nuclear cataract of both eyes    Mild  . Overweight   . Pain in right ankle and joints of right foot   . Shortness of breath   . Sinus complaint   . Sinusitis   . Tired   . TMJ pain dysfunction syndrome    occasional    PAST SURGICAL HISTORY: Past Surgical History:  Procedure Laterality Date  . AUGMENTATION MAMMAPLASTY Bilateral 2006  . BREAST ENHANCEMENT SURGERY  2006  . BUNIONECTOMY    . COLONOSCOPY    . endrometroisis    . fallopian tube removed     Left  . UPPER GASTROINTESTINAL ENDOSCOPY    . wisdoim teeth extraction      SOCIAL HISTORY: Social History   Tobacco Use  . Smoking status: Never Smoker  . Smokeless tobacco: Never Used  Substance Use Topics  . Alcohol use: Yes    Alcohol/week: 0.0 oz    Comment: socially - occasional   . Drug use: No    FAMILY HISTORY: Family History  Problem Relation Age of Onset  . Throat cancer Mother   . Cancer - Other Mother 55       throat - died 60 months  . Cancer Mother   . Alcoholism Mother   . Heart disease Father   . Cancer Father   . Breast cancer Maternal Aunt        breast  . Colon cancer Maternal Uncle 49       died 71  . Breast cancer Maternal Aunt        breast  . Pancreatic cancer Maternal Aunt   . Diabetes Unknown        grandmother  . Hypertension Maternal Aunt        several family members  . Throat cancer Maternal Uncle   . Asthma Unknown        cousin, maternal  . Breast cancer Maternal Aunt        total of 5 aunts  . Breast cancer Maternal Aunt   . Colon cancer Paternal Grandfather   . Heart attack Neg Hx   . Rectal cancer Neg Hx   . Stomach cancer Neg Hx     ROS: Review of Systems  Constitutional: Negative for weight loss.  Gastrointestinal: Negative for nausea and vomiting.   Musculoskeletal:       Negative muscle weakness    PHYSICAL EXAM: Blood pressure 111/67, pulse 97, temperature 98.3 F (36.8 C), temperature source Oral, height 5\' 3"  (  1.6 m), weight 201 lb (91.2 kg), SpO2 98 %. Body mass index is 35.61 kg/m. Physical Exam  Constitutional: She is oriented to person, place, and time. She appears well-developed and well-nourished.  Cardiovascular: Normal rate.  Pulmonary/Chest: Effort normal.  Musculoskeletal: Normal range of motion.  Neurological: She is oriented to person, place, and time.  Skin: Skin is warm and dry.  Psychiatric: She has a normal mood and affect. Her behavior is normal.  Vitals reviewed.   RECENT LABS AND TESTS: BMET    Component Value Date/Time   NA 138 08/14/2017 1152   K 4.1 08/14/2017 1152   CL 102 08/14/2017 1152   CO2 22 08/14/2017 1152   GLUCOSE 86 08/14/2017 1152   GLUCOSE 89 02/27/2017 1552   BUN 13 08/14/2017 1152   CREATININE 0.76 08/14/2017 1152   CALCIUM 9.1 08/14/2017 1152   GFRNONAA 93 08/14/2017 1152   GFRAA 107 08/14/2017 1152   Lab Results  Component Value Date   HGBA1C 5.5 08/14/2017   HGBA1C 5.6 06/09/2015   HGBA1C 5.7 (H) 05/22/2014   Lab Results  Component Value Date   INSULIN 8.0 08/14/2017   CBC    Component Value Date/Time   WBC 9.0 08/14/2017 1152   WBC 9.7 03/30/2017 1512   RBC 4.26 08/14/2017 1152   RBC 4.48 03/30/2017 1512   HGB 12.3 08/14/2017 1152   HCT 38.6 08/14/2017 1152   PLT 416 (H) 03/30/2017 1512   MCV 91 08/14/2017 1152   MCH 28.9 08/14/2017 1152   MCH 28.6 03/30/2017 1512   MCHC 31.9 08/14/2017 1152   MCHC 32.5 03/30/2017 1512   RDW 13.7 08/14/2017 1152   LYMPHSABS 2.3 08/14/2017 1152   MONOABS 0.7 02/27/2017 1552   EOSABS 0.1 08/14/2017 1152   BASOSABS 0.0 08/14/2017 1152   Iron/TIBC/Ferritin/ %Sat No results found for: IRON, TIBC, FERRITIN, IRONPCTSAT Lipid Panel     Component Value Date/Time   CHOL 188 08/14/2017 1152   TRIG 92 08/14/2017 1152    HDL 71 08/14/2017 1152   CHOLHDL 3 05/14/2014 1223   VLDL 12.2 05/14/2014 1223   LDLCALC 99 08/14/2017 1152   Hepatic Function Panel     Component Value Date/Time   PROT 7.0 08/14/2017 1152   ALBUMIN 4.2 08/14/2017 1152   AST 17 08/14/2017 1152   ALT 18 08/14/2017 1152   ALKPHOS 73 08/14/2017 1152   BILITOT 0.2 08/14/2017 1152   BILIDIR 0.0 05/14/2014 1223      Component Value Date/Time   TSH 0.976 08/14/2017 1152   TSH 1.26 05/14/2014 1223   TSH 1.45 02/06/2014 1107  Results for PARI, LOMBARD A (MRN [id number]) as of 12/05/2017 16:45  Ref. Range 08/14/2017 11:52  Vitamin D, 25-Hydroxy Latest Ref Range: 30.0 - 100.0 ng/mL 26.9 (L)    ASSESSMENT AND PLAN: Vitamin D deficiency - Plan: Vitamin D, Ergocalciferol, (DRISDOL) 50000 units CAPS capsule  At risk for osteoporosis  Class 2 severe obesity with serious comorbidity and body mass index (BMI) of 35.0 to 35.9 in adult, unspecified obesity type ([place]) - Plan: Naltrexone-buPROPion HCl ER 8-90 MG TB12  PLAN:  Vitamin D Deficiency Morgan Long was informed that low vitamin D levels contributes to fatigue and are associated with obesity, breast, and colon cancer. Morgan Long agrees to continue taking prescription Vit D @50 ,000 IU every week #4 and we will refill for 1 month. She will follow up for routine testing of vitamin D, at least 2-3 times per year. She was informed of the risk of  over-replacement of vitamin D and agrees to not increase her dose unless she discusses this with [name] first. Morgan Long agrees to follow up with our clinic in 2 to 3 weeks.  At risk for osteopenia and osteoporosis Morgan Long is at risk for osteopenia and osteoporsis due to her vitamin D deficiency. She was encouraged to take her vitamin D and follow her higher calcium diet and increase strengthening exercise to help strengthen her bones and decrease her risk of osteopenia and osteoporosis.  Obesity Morgan Long is currently in the action stage of change. As such, her goal is to continue  with weight loss efforts She has agreed to keep a food journal with 1400 calories and 80+ grams of protein daily Morgan Long has been instructed to work up to a goal of 150 minutes of combined cardio and strengthening exercise per week for weight loss and overall health benefits. We discussed the following Behavioral Modification Strategies today: work on meal planning and easy cooking plans, dealing with family or coworker sabotage, and travel eating strategies  We discussed various medication options to help Morgan Long with her weight loss efforts and we both agreed to discontinue Belviq and she agrees to start Contrave 8-90 mg 2 tablets PO BID #120 with no refills.   Morgan Long has agreed to follow up with our clinic in 2 to 3 weeks. She was informed of the importance of frequent follow up visits to maximize her success with intensive lifestyle modifications for her multiple health conditions.   OBESITY BEHAVIORAL INTERVENTION VISIT  Today's visit was # 7 out of 22.  Starting weight: 200 lbs Starting date: 08/14/17 Today's weight : 201 lbs Today's date: 12/05/2017 Total lbs lost to date: 0 (Patients must lose 7 lbs in the first 6 months to continue with counseling)   ASK: We discussed the diagnosis of obesity with [place] today and Morgan Long agreed to give [name] permission to discuss obesity behavioral modification therapy today.  ASSESS: Morgan Long has the diagnosis of obesity and her BMI today is 35.61 Morgan Long is in the action stage of change   ADVISE: Morgan Long was educated on the multiple health risks of obesity as well as the benefit of weight loss to improve her health. She was advised of the need for long term treatment and the importance of lifestyle modifications.  AGREE: Multiple dietary modification options and treatment options were discussed and  Morgan Long agreed to the above obesity treatment plan.  I, Trixie Dredge, am acting as transcriptionist for Dennard Nip, MD  I have reviewed the above  documentation for accuracy and completeness, and I agree with the above. -Dennard Nip, MD

## 2017-12-10 ENCOUNTER — Telehealth: Payer: Self-pay | Admitting: Neurology

## 2017-12-10 NOTE — Telephone Encounter (Signed)
[employer] paperwork filled out and placed on Dr. Guadelupe Sabin desk for signature.

## 2017-12-11 NOTE — Telephone Encounter (Signed)
Received the paperwork back form Dr. Rexene Alberts. I called the patient to inquire about her success rate with the previous two injections. She did not answer so I left a VM asking her to call me back.

## 2017-12-11 NOTE — Telephone Encounter (Signed)
Patient advised she did benefit from previous injections and her headaches have decreased by 50%.

## 2017-12-11 NOTE — Telephone Encounter (Signed)
Fax has been sent to [facility] for authorizations.

## 2017-12-17 NOTE — Telephone Encounter (Signed)
Botox authorization  [id number] [phone] (12/11/2017-12/11/2018). DW

## 2017-12-18 NOTE — Telephone Encounter (Signed)
I called to check status of the patients Botox medication. I spoke with Myra at [facility] who stated it was still pending insurance verification.

## 2017-12-19 NOTE — Telephone Encounter (Signed)
I called the [facility] and I spoke with Consuela who told me it has been marked stat and is still pending.

## 2017-12-20 NOTE — Telephone Encounter (Signed)
I called to check status of the patients medication and spoke with Tedrick, he stated that the notes were requesting a PA I told him the PA has been approved and on file since the 9th of this month. I provided all of the PA information to him.

## 2017-12-24 NOTE — Telephone Encounter (Signed)
I called [facility] and they stated it was still in insurance verification.

## 2017-12-24 NOTE — Telephone Encounter (Signed)
[name] [phone] has called for Morgan Long re: PA on Botox.  Morgan Long stated the approval is for [name] only or they can be called and the prior authorization can be updated you can call [phone] as dispensing pharmacy

## 2017-12-24 NOTE — Telephone Encounter (Signed)
I called [facility] and spoke with Shelia to check status of the medication. It was still pending. She transferred me to the insurance department where I spoke with Toy.  Toy stated that they needed a PA. I informed her that I had already given them the information on the 18th. She said there was no record of me previously speak to anyone regarding a PA. She sent the information over to a supervisor.

## 2017-12-25 NOTE — Telephone Encounter (Signed)
Medication ready to ship, waiting on patient consent. Contacted patient X2 with no success.

## 2017-12-25 NOTE — Telephone Encounter (Signed)
I called [employer] and changed the dispensing pharmacy. I called [facility] and spoke with a representative in insurance who told me that there was no documentation of me every giving PA information. I provided the information again.

## 2017-12-26 ENCOUNTER — Encounter: Payer: Self-pay | Admitting: Neurology

## 2017-12-26 ENCOUNTER — Telehealth: Payer: Self-pay | Admitting: Neurology

## 2017-12-26 ENCOUNTER — Ambulatory Visit (INDEPENDENT_AMBULATORY_CARE_PROVIDER_SITE_OTHER): Payer: BLUE CROSS/BLUE SHIELD | Admitting: Neurology

## 2017-12-26 VITALS — BP 126/80 | HR 100 | Ht 63.0 in

## 2017-12-26 DIAGNOSIS — G43719 Chronic migraine without aura, intractable, without status migrainosus: Secondary | ICD-10-CM | POA: Diagnosis not present

## 2017-12-26 DIAGNOSIS — S93492S Sprain of other ligament of left ankle, sequela: Secondary | ICD-10-CM | POA: Diagnosis not present

## 2017-12-26 MED ORDER — ONABOTULINUMTOXINA 100 UNITS IJ SOLR
200.0000 [IU] | Freq: Once | INTRAMUSCULAR | Status: AC
  Administered 2017-12-26: 200 [IU] via INTRAMUSCULAR

## 2017-12-26 NOTE — Patient Instructions (Signed)
Please remember, botulinum toxin takes about 3-7 days to kick in. As discussed, this is not a pain shot. The purpose of the injections is to gradually improve your symptoms. In some patients it takes up to 2-3 weeks to make a difference and it wears off with time. Sometimes it may wear off before it is time for the next injection. We still should wait till the next 3 monthly injection, because injecting too frequently may cause you to develop immunity to the botulinum toxin. We are looking for a reduction in the severity and/or frequency of your symptoms. As a reminder, side effects to look out for are (but not limited to): mouth dryness, dryness of the eyes, heaviness of your head or muscle weakness, including droopy face or droopy eyelid(s), rarely: speech or swallowing difficulties and very rarely: breathing difficulties. Some people have transient neck pain or soreness which typically responds to over-the-counter anti-inflammatory medication and local heat application with a heat pad. If you think you have a severe reaction to the botulinum toxin, such as weakness, trouble speaking, trouble breathing, or trouble swallowing, you have to call 911 or have someone take you to the nearest emergency room. However, most people have either no or minimal side effects from the injections. It is normal to have a little bit of redness and swelling around the injection sites which usually improves after a few hours. Rarely, there may be a bruise that improves on its own. Most side effects reported are very mild and resolve within 10-14 days. Please feel free to call us if you have any additional questions or concerns: [phone] or email us through My Chart. We may have to adjust the dose over time, depending on your results from this injection and your overall response over time to this medication.   

## 2017-12-26 NOTE — Telephone Encounter (Signed)
3 mo botox inj  °

## 2017-12-26 NOTE — Progress Notes (Signed)
Morgan Long is a 47 year old right-handed woman with an underlying medical history of allergic rhinitis, endometriosis, TMJ problems, and neck pain, as well as obesity, who presents for her third round of botulinum toxin injections for her chronic intractable migraine headaches. The patient is unaccompanied today. I last saw her on 09/25/2017 for second round of injections. She felt that in the week prior to the injection her headaches were more intense.    Today, 12/26/2017: She reports doing okay, has felt an increase in her migraine intensity in the past week. She had recent foot surgery and may require root canal in the near future. In a boot, no pain meds.    Written informed consent for recurrent, 3 monthly intramuscular injections with botulinum toxin for this indication has previously been obtained and scanned into the patient's electronic chart. I will re-consent if the type of botulinum toxin used or the indication for injection changes for this patient in the future. The patient is informed that we will use the same consent for her recurrent, most likely 3 monthly injections. She demonstrated understanding and voiced agreement.   I have previously talked to the patient in detail about expectations, limitations, benefits as well as potential adverse effects of botulinum toxin injections. The patient understands that the side effects include (but are not limited to): Mouth dryness, dryness of eyes, speech and swallowing difficulties, respiratory depression or problems breathing, weakness of muscles including more distant muscles than the ones injected, flu-like symptoms, myalgias, injection site reactions such as redness, itching, swelling, pain, and infection.   200 units of botulinum toxin type A were reconstituted using preservative-free normal saline to a concentration of 10 units per 0.1 mL and drawn up into 1 mL tuberculin syringes. Lot number was [id number], expiration dates Feb 2022 for both  100 unit vials, buy and bill.     O/E: BP 126/80   Pulse 100   Ht 5' 3"  (1.6 m)   BMI 35.61 kg/m      The patient was situated in a chair, sitting comfortably. After preparing the areas with 70% isopropyl alcohol and using a 26 gauge 1 1/2 inch hollow lumen recording EMG needle for the neck injections as well as a 30 gauge 1 inch needle for the facial injections, a total dose of 155 units of botulinum toxin type A in the form of Botox was injected into the muscles and the following distribution and quantities:   #1: 10 units on the right and 10 units in the left frontalis muscles, broken down in 2 sites on each side. #2: 5 units in the right and 5 units in the left corrugator muscles. #3: 15 units in the right and 15 units in the left occipitalis muscles, broken down in 3 sites on each side. #4: 20 units in the right and 20 units in the left temporalis muscles, broken down in 4 sites on each side.  #5: 15 units on the right and 15 units in the left upper trapezius muscles, broken down in 3 sites on each side. #6: 10 units in the right and 10 units in the left splenius capitis muscles, broken down in 2 sites on each side.  #7: 2.5 units in the right and 2.5 units in the left procerus muscles.   EMG guidance was utilized for the neck injections with mild EMG activity noted, especially in the splenius capitis muscles.    A dose of 45 units out of a total dose of 200 units was discarded  as unavoidable waste.    The patient tolerated the procedure well without immediate complications. She was advised to make a followup appointment for repeat injections in 3 months from now and encouraged to call us with any interim questions, concerns, problems, or updates. She was in agreement and did not have any questions prior to leaving clinic today.   Previously:   I saw her on 06/28/2017 for her initial Botox injection, at which time she received 155 units of Botox.     She was seen by her primary care  physician and received a Toradol injection on 06/19/2017 as well as a prescription for Relpax.    04/25/17: Dr. Melton Alar retired. She reports a long-standing history of migraine headaches, she recalls she was about 47 years old when she started having migraines. For quite some time she had spontaneous improvement in her migraines but some 4 or 5 years ago started having more frequent migraines. She has tried and failed multiple preventative medications but does not have a list with her and prior records from Dr. Audery Amel office are not available today. She reports that his office is closed at this point. She did not get her records from his office. Perhaps her primary care provider has records. I encouraged her to try to obtain those records as it will help [name] manage her migraines optimally. She recalls having tried Topamax. She had hair loss from some medications but is not completely sure which ones. Topamax did not help, Zonegran caused her sedation, nortriptyline was also tried but she is unsure what happened with it. Amitriptyline she may have tried as well. She did not try Depakote. She is worried about sedation side effects as she also is going to school and has to be alert throughout the day. She takes occasional nausea medicine which likely is Zofran under the tongue. She has tried Maxalt under the tongue which is helpful at times. She needs a refill on that. She has regular eye exams because of her history of dry eyes. She used to wear a single contact but was advised to stop using it.  She has had 2 rounds of Botox injections, last time about 3 months ago as she recalls. She remembers doing okay with it. Prior to starting Botox injections she reports having about 18-20 headache days per month typically. She does not always have a typical aura but sometimes has visual blurriness and bringing in her ears to warn her about an upcoming migraine headache. She has occasional nausea that precedes the  headache.   I saw her on 06/09/2015 at which time she was referred is a new patient referral for a new problem, referred by her optometrist at the time for 1 month history of blurry vision. Her exam at the time was nonfocal and reassuringly she had no significant eye related findings. I suggested further workup in the form of visual evoked potentials, blood work, and she also reported a 6 month history of feeling tired. She had no one-sided weakness, tingling or numbness. Symptoms from the past which included paresthesias had resolved completely. She was under the care of Dr. Melton Alar for migraines. She was in the process of titrating Zonegran. She was on 225 mg each night. When she took 300 mg each night she felt too sleepy during the day. She denied any symptoms of sleep disordered breathing. She did admit not being a good sleeper. She did endorse stress as she was in school online for psychology and was also working  off-and-on in real estate. She reported not drinking sodas, not drinking alcohol and she reported not smoking. She did report pain with eye movements and dry eyes.  She reported no family history of multiple sclerosis or lupus. She denied joint pain with the exception of bilateral knee pain and she also reported a 50 pound weight gain in the last year. She had visual evoked potentials on 07/06/2015: Impression: The visual evoked response test above was within normal limits bilaterally. No evidence of conduction slowing was seen within the anterior visual pathways on either side on today's evaluation.   We called her with her test results. Labs from 06/09/2015 showed normal A1c, normal vitamin D level, normal ANA, normal RF, normal ESR. CRP was elevated at 15.6. 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   She had a brain MRI with and  without contrast on 06/23/2015: IMPRESSION:  This is a normal MRI of the brain with and without contrast  In addition, personally reviewed the images through the PACS system. We called her with her test results.   I first met her on 05/22/2014 at the request of her neurosurgeon, Dr. Kathyrn Sheriff, at which time the patient reported intermittent right arm numbness, particularly with neck position changes. I suggested blood work and EMG and nerve conduction testing of the right upper extremity. Her blood work showed elevated B12 and B6 levels, indicative of B vitamin supplementation. Hemoglobin A1c was 5.7. We called her with her test results. She had EMG and nerve conduction testing on 06/02/2014: IMPRESSION:   Nerve conduction studies done on both upper extremities were unremarkable, without evidence of a neuropathy seen. EMG evaluation of the right upper extremity was unremarkable, without evidence of an overlying cervical radiculopathy. We called her with her test results. At the time, she reported improved symptoms.   05/22/2014: She has intermittent right arm numbness. Her symptoms have been going on for about 6 months. She has not noted any permanent numbness and no issues elsewhere. She does not have any significant weakness and sometimes feels weak when the numbness seems to come on. It goes away if she changes positions or adjusts her neck position. She has had right shoulder problems and pain in the right shoulder.   You saw her on 05/14/2014 for neck pain. She had undergone physical therapy without improvement of her neck pain. She had cervical epidural steroid injections which helped for about 24 hours as understand.   She had a C-spine MRI without contrast on 01/07/2014: Mild cervical spondylosis as described above without significant disc protrusion, foraminal stenosis or central canal stenosis.    Blood work from 05/14/2014 was reviewed: She had a BMP, CBC with differential, liver function  panel, lipid panel and TSH all of which were fine with the exception of a borderline LDL of 111.

## 2017-12-27 ENCOUNTER — Ambulatory Visit (INDEPENDENT_AMBULATORY_CARE_PROVIDER_SITE_OTHER): Payer: BLUE CROSS/BLUE SHIELD | Admitting: Family Medicine

## 2018-01-01 DIAGNOSIS — M79672 Pain in left foot: Secondary | ICD-10-CM | POA: Diagnosis not present

## 2018-01-01 NOTE — Telephone Encounter (Signed)
I called to schedule the patient but she did not answer. I left a VM asking her to call me back.  °

## 2018-01-02 ENCOUNTER — Ambulatory Visit (INDEPENDENT_AMBULATORY_CARE_PROVIDER_SITE_OTHER): Payer: BLUE CROSS/BLUE SHIELD | Admitting: Family Medicine

## 2018-01-02 VITALS — BP 110/70 | HR 95 | Ht 63.0 in | Wt 195.0 lb

## 2018-01-02 DIAGNOSIS — R7303 Prediabetes: Secondary | ICD-10-CM | POA: Diagnosis not present

## 2018-01-02 DIAGNOSIS — E669 Obesity, unspecified: Secondary | ICD-10-CM

## 2018-01-02 DIAGNOSIS — Z6834 Body mass index (BMI) 34.0-34.9, adult: Secondary | ICD-10-CM | POA: Diagnosis not present

## 2018-01-02 NOTE — Progress Notes (Signed)
Office: [phone]  /  Fax: [phone]   HPI:   Chief Complaint: OBESITY Morgan Long is here to discuss her progress with her obesity treatment plan. She is on the keep a food journal with 1400 calories and 80+ grams of protein daily and is following her eating plan approximately 80 % of the time. She states she is exercising with strengthening bands for 30 minutes 7 times per week. [name] has done well with weight loss, she is journaling on and off but has done better with weight loss. She has failed Belviq and phentermine and her insurance didn't finish her prior authorization for Contrave. She has not done well overall with most of her structured eating plans. Her weight is 195 lb (88.5 kg) today and has had a weight loss of 6 pounds over a period of 4 weeks since her last visit. She has lost 5 lbs since starting treatment with [name].  Pre-Diabetes [name] has a diagnosis of pre-diabetes based on her elevated Hgb A1c and was informed this puts her at greater risk of developing diabetes. She is stable on metformin and denies nausea, vomiting, or hypoglycemia. She is working on diet but struggling to decrease simple carbohydrates and fried foods.    ALLERGIES: Allergies  Allergen Reactions  . Acetaminophen Itching    Patient can take tylenol, just not tylenol#3  . Hydrocodone Nausea Only  . Oxycodone Itching  . Phosphate Itching    sick  . Codeine Itching and Nausea Only    MEDICATIONS: Current Outpatient Medications on File Prior to Visit  Medication Sig Dispense Refill  . albuterol (PROAIR HFA) 108 (90 Base) MCG/ACT inhaler INHALE TWO PUFFS EVERY 4-6 HOURS IF NEEDED FOR COUGH WHEEZE. 8.5 Inhaler 0  . beclomethasone (QVAR REDIHALER) 80 MCG/ACT inhaler Inhale 2 puffs 2 (two) times daily into the lungs. 10.6 g 5  . budesonide-formoterol (SYMBICORT) 160-4.5 MCG/ACT inhaler Inhale 2 puffs 2 (two) times daily into the lungs. 1 Inhaler 5  . cetirizine (ZYRTEC) 10 MG tablet Take 1 tablet (10 mg  total) daily by mouth. 30 tablet 5  . cyclobenzaprine (FLEXERIL) 10 MG tablet Take 1 tablet (10 mg total) by mouth 3 (three) times daily as needed for muscle spasms. 30 tablet 0  . ESTROGEL 0.75 MG/1.25 GM (0.06%) topical gel Apply 1 application topically See admin instructions.  0  . etonogestrel-ethinyl estradiol (NUVARING) 0.12-0.015 MG/24HR vaginal ring Place 1 each vaginally every 28 (twenty-eight) days. Insert vaginally and leave in place for 3 consecutive weeks, then remove for 1 week.     Marland Kitchen ibuprofen (ADVIL,MOTRIN) 800 MG tablet Take 800 mg by mouth every 6 (six) hours as needed. for pain  3  . LINZESS 72 MCG capsule TAKE 1 CAPSULE (72 MCG TOTAL) BY MOUTH DAILY BEFORE BREAKFAST. 30 capsule 8  . metFORMIN (GLUCOPHAGE) 500 MG tablet Take 1 tablet (500 mg total) by mouth daily with breakfast. 30 tablet 0  . metoCLOPramide (REGLAN) 10 MG tablet Take 1 tablet (10 mg total) by mouth 4 (four) times daily. 120 tablet 1  . Multiple Vitamin (MULTIVITAMIN) LIQD Take 5 mLs by mouth daily.    Marland Kitchen omeprazole (PRILOSEC) 40 MG capsule Take 1 capsule (40 mg total) by mouth daily. 30 capsule 0  . ondansetron (ZOFRAN-ODT) 4 MG disintegrating tablet TAKE 1-2 TABLETS BY MOUTH 3 TIMES A DAY AS NEEDED 16 tablet 1  . RESTASIS MULTIDOSE 0.05 % ophthalmic emulsion Place 1 drop into both eyes as needed.  3  . rizatriptan (MAXALT-MLT) 10  MG disintegrating tablet Take 1 tablet (10 mg total) by mouth as needed for migraine. May repeat in 2 hours if needed 9 tablet 11  . Vitamin D, Ergocalciferol, (DRISDOL) 50000 units CAPS capsule Take 1 capsule (50,000 Units total) by mouth every 7 (seven) days. 4 capsule 0   No current facility-administered medications on file prior to visit.     PAST MEDICAL HISTORY: Past Medical History:  Diagnosis Date  . Allergic rhinitis   . Asthma   . Chronic migraine    Dr. Catalina Gravel  . Constipation   . Dry eye syndrome of both lacrimal glands   . Dry skin   . Endometriosis   . Fatigue     . Gastroparesis   . GERD (gastroesophageal reflux disease)   . Headache(784.0)    occasional, dx w/ Migraines before, Topamax helps  . Heartburn   . History of stomach ulcers   . Lactose intolerance   . Localized edema   . Migraine   . Muscle weakness (generalized)   . Nuclear cataract of both eyes    Mild  . Overweight   . Pain in right ankle and joints of right foot   . Shortness of breath   . Sinus complaint   . Sinusitis   . Tired   . TMJ pain dysfunction syndrome    occasional    PAST SURGICAL HISTORY: Past Surgical History:  Procedure Laterality Date  . AUGMENTATION MAMMAPLASTY Bilateral 2006  . BREAST ENHANCEMENT SURGERY  2006  . BUNIONECTOMY    . COLONOSCOPY    . endrometroisis    . fallopian tube removed     Left  . UPPER GASTROINTESTINAL ENDOSCOPY    . wisdoim teeth extraction      SOCIAL HISTORY: Social History   Tobacco Use  . Smoking status: Never Smoker  . Smokeless tobacco: Never Used  Substance Use Topics  . Alcohol use: Yes    Alcohol/week: 0.0 oz    Comment: socially - occasional   . Drug use: No    FAMILY HISTORY: Family History  Problem Relation Age of Onset  . Throat cancer Mother   . Cancer - Other Mother 55       throat - died 58 months  . Cancer Mother   . Alcoholism Mother   . Heart disease Father   . Cancer Father   . Breast cancer Maternal Aunt        breast  . Colon cancer Maternal Uncle 69       died 60  . Breast cancer Maternal Aunt        breast  . Pancreatic cancer Maternal Aunt   . Diabetes Unknown        grandmother  . Hypertension Maternal Aunt        several family members  . Throat cancer Maternal Uncle   . Asthma Unknown        cousin, maternal  . Breast cancer Maternal Aunt        total of 5 aunts  . Breast cancer Maternal Aunt   . Colon cancer Paternal Grandfather   . Heart attack Neg Hx   . Rectal cancer Neg Hx   . Stomach cancer Neg Hx     ROS: Review of Systems  Constitutional: Positive for  weight loss.  Gastrointestinal: Negative for nausea and vomiting.  Endo/Heme/Allergies:       Negative hypoglycemia    PHYSICAL EXAM: Blood pressure 110/70, pulse 95, height 5\' 3"  (1.6  m), weight 195 lb (88.5 kg), last menstrual period 12/15/2017, SpO2 96 %. Body mass index is 34.54 kg/m. Physical Exam  Constitutional: She is oriented to person, place, and time. She appears well-developed and well-nourished.  Cardiovascular: Normal rate.  Pulmonary/Chest: Effort normal.  Musculoskeletal: Normal range of motion.  Neurological: She is oriented to person, place, and time.  Skin: Skin is warm and dry.  Psychiatric: She has a normal mood and affect. Her behavior is normal.  Vitals reviewed.   RECENT LABS AND TESTS: BMET    Component Value Date/Time   NA 138 08/14/2017 1152   K 4.1 08/14/2017 1152   CL 102 08/14/2017 1152   CO2 22 08/14/2017 1152   GLUCOSE 86 08/14/2017 1152   GLUCOSE 89 02/27/2017 1552   BUN 13 08/14/2017 1152   CREATININE 0.76 08/14/2017 1152   CALCIUM 9.1 08/14/2017 1152   GFRNONAA 93 08/14/2017 1152   GFRAA 107 08/14/2017 1152   Lab Results  Component Value Date   HGBA1C 5.5 08/14/2017   HGBA1C 5.6 06/09/2015   HGBA1C 5.7 (H) 05/22/2014   Lab Results  Component Value Date   INSULIN 8.0 08/14/2017   CBC    Component Value Date/Time   WBC 9.0 08/14/2017 1152   WBC 9.7 03/30/2017 1512   RBC 4.26 08/14/2017 1152   RBC 4.48 03/30/2017 1512   HGB 12.3 08/14/2017 1152   HCT 38.6 08/14/2017 1152   PLT 416 (H) 03/30/2017 1512   MCV 91 08/14/2017 1152   MCH 28.9 08/14/2017 1152   MCH 28.6 03/30/2017 1512   MCHC 31.9 08/14/2017 1152   MCHC 32.5 03/30/2017 1512   RDW 13.7 08/14/2017 1152   LYMPHSABS 2.3 08/14/2017 1152   MONOABS 0.7 02/27/2017 1552   EOSABS 0.1 08/14/2017 1152   BASOSABS 0.0 08/14/2017 1152   Iron/TIBC/Ferritin/ %Sat No results found for: IRON, TIBC, FERRITIN, IRONPCTSAT Lipid Panel     Component Value Date/Time   CHOL 188  08/14/2017 1152   TRIG 92 08/14/2017 1152   HDL 71 08/14/2017 1152   CHOLHDL 3 05/14/2014 1223   VLDL 12.2 05/14/2014 1223   LDLCALC 99 08/14/2017 1152   Hepatic Function Panel     Component Value Date/Time   PROT 7.0 08/14/2017 1152   ALBUMIN 4.2 08/14/2017 1152   AST 17 08/14/2017 1152   ALT 18 08/14/2017 1152   ALKPHOS 73 08/14/2017 1152   BILITOT 0.2 08/14/2017 1152   BILIDIR 0.0 05/14/2014 1223      Component Value Date/Time   TSH 0.976 08/14/2017 1152   TSH 1.26 05/14/2014 1223   TSH 1.45 02/06/2014 1107    ASSESSMENT AND PLAN: Prediabetes  Class 1 obesity with serious comorbidity and body mass index (BMI) of 34.0 to 34.9 in adult, unspecified obesity type  PLAN:  Pre-Diabetes Jerrilyn will continue to work on weight loss, exercise, and decreasing simple carbohydrates in her diet to help decrease the risk of diabetes. We dicussed metformin including benefits and risks. She was informed that eating too many simple carbohydrates or too many calories at one sitting increases the likelihood of GI side effects. Lynise agrees to continue taking metformin and we discussed diet in depth. Shyane agrees to follow up with our clinic in 2 to 3 weeks as directed to monitor her progress.  We spent > than 50% of the 15 minute visit on the counseling as documented in the note.  Obesity Jameriah is currently in the action stage of change. As such, her goal is  to continue with weight loss efforts She has agreed to keep a food journal with 1400 calories and 80+ grams of protein daily Jesslyn has been instructed to work up to a goal of 150 minutes of combined cardio and strengthening exercise per week for weight loss and overall health benefits. Recipe handout given. We discussed the following Behavioral Modification Strategies today: work on meal planning and easy cooking plans, keeping healthy foods in the home, better snacking choices, and planning for success   Arlyn has agreed to follow up with  our clinic in 2 to 3 weeks. She was informed of the importance of frequent follow up visits to maximize her success with intensive lifestyle modifications for her multiple health conditions.   OBESITY BEHAVIORAL INTERVENTION VISIT  Today's visit was # 8 out of 22.  Starting weight: 200 lbs Starting date: 08/14/17 Today's weight : 195 lbs  Today's date: 01/02/2018 Total lbs lost to date: 5    ASK: We discussed the diagnosis of obesity with [place] today and Dymin agreed to give [name] permission to discuss obesity behavioral modification therapy today.  ASSESS: Rue has the diagnosis of obesity and her BMI today is 34.55 Mishayla is in the action stage of change   ADVISE: Sam was educated on the multiple health risks of obesity as well as the benefit of weight loss to improve her health. She was advised of the need for long term treatment and the importance of lifestyle modifications.  AGREE: Multiple dietary modification options and treatment options were discussed and  Lennan agreed to the above obesity treatment plan.  I, Trixie Dredge, am acting as transcriptionist for Dennard Nip, MD  I have reviewed the above documentation for accuracy and completeness, and I agree with the above. -Dennard Nip, MD

## 2018-01-03 ENCOUNTER — Encounter (INDEPENDENT_AMBULATORY_CARE_PROVIDER_SITE_OTHER): Payer: Self-pay | Admitting: Family Medicine

## 2018-01-03 DIAGNOSIS — S93492A Sprain of other ligament of left ankle, initial encounter: Secondary | ICD-10-CM | POA: Diagnosis not present

## 2018-01-10 ENCOUNTER — Other Ambulatory Visit (INDEPENDENT_AMBULATORY_CARE_PROVIDER_SITE_OTHER): Payer: Self-pay | Admitting: Family Medicine

## 2018-01-10 ENCOUNTER — Other Ambulatory Visit: Payer: Self-pay | Admitting: Family Medicine

## 2018-01-10 DIAGNOSIS — E8881 Metabolic syndrome: Secondary | ICD-10-CM

## 2018-01-14 ENCOUNTER — Encounter (INDEPENDENT_AMBULATORY_CARE_PROVIDER_SITE_OTHER): Payer: Self-pay

## 2018-01-15 ENCOUNTER — Encounter (INDEPENDENT_AMBULATORY_CARE_PROVIDER_SITE_OTHER): Payer: Self-pay | Admitting: Family Medicine

## 2018-01-23 ENCOUNTER — Other Ambulatory Visit (INDEPENDENT_AMBULATORY_CARE_PROVIDER_SITE_OTHER): Payer: Self-pay | Admitting: Family Medicine

## 2018-01-23 ENCOUNTER — Ambulatory Visit (INDEPENDENT_AMBULATORY_CARE_PROVIDER_SITE_OTHER): Payer: BLUE CROSS/BLUE SHIELD | Admitting: Family Medicine

## 2018-01-23 VITALS — BP 103/69 | HR 92 | Temp 98.3°F | Ht 63.0 in | Wt 201.0 lb

## 2018-01-23 DIAGNOSIS — M6281 Muscle weakness (generalized): Secondary | ICD-10-CM | POA: Diagnosis not present

## 2018-01-23 DIAGNOSIS — Z9189 Other specified personal risk factors, not elsewhere classified: Secondary | ICD-10-CM | POA: Diagnosis not present

## 2018-01-23 DIAGNOSIS — R7989 Other specified abnormal findings of blood chemistry: Secondary | ICD-10-CM | POA: Diagnosis not present

## 2018-01-23 DIAGNOSIS — E559 Vitamin D deficiency, unspecified: Secondary | ICD-10-CM

## 2018-01-23 DIAGNOSIS — Z6835 Body mass index (BMI) 35.0-35.9, adult: Secondary | ICD-10-CM

## 2018-01-23 DIAGNOSIS — R7303 Prediabetes: Secondary | ICD-10-CM

## 2018-01-23 DIAGNOSIS — E7849 Other hyperlipidemia: Secondary | ICD-10-CM | POA: Diagnosis not present

## 2018-01-23 DIAGNOSIS — M25675 Stiffness of left foot, not elsewhere classified: Secondary | ICD-10-CM | POA: Diagnosis not present

## 2018-01-23 DIAGNOSIS — Z7409 Other reduced mobility: Secondary | ICD-10-CM | POA: Diagnosis not present

## 2018-01-23 DIAGNOSIS — S93492S Sprain of other ligament of left ankle, sequela: Secondary | ICD-10-CM | POA: Diagnosis not present

## 2018-01-23 DIAGNOSIS — R269 Unspecified abnormalities of gait and mobility: Secondary | ICD-10-CM | POA: Diagnosis not present

## 2018-01-23 DIAGNOSIS — X58XXXS Exposure to other specified factors, sequela: Secondary | ICD-10-CM | POA: Diagnosis not present

## 2018-01-23 MED ORDER — VITAMIN D (ERGOCALCIFEROL) 1.25 MG (50000 UNIT) PO CAPS: 50000.0000 [IU] | ORAL_CAPSULE | ORAL | 0 refills | Status: DC

## 2018-01-23 MED ORDER — METFORMIN HCL 500 MG PO TABS: ORAL_TABLET | ORAL | 0 refills | Status: DC

## 2018-01-23 NOTE — Progress Notes (Signed)
Office: [phone]  /  Fax: [phone]   HPI:   Chief Complaint: OBESITY Morgan Long is here to discuss her progress with her obesity treatment plan. She is on the keep a food journal with 1400 calories and 80+ grams of protein daily and is following her eating plan approximately 60 % of the time. She states she is doing leg, arm and abdominal exercises 30 minutes 3 times per week. Morgan Long was put in a boot for her left ankle on 11/16/17. She gained six pounds, "staying with her sister", and she did less planning. Morgan Long is getting around 80 grams of protein daily. Her weight is 201 lb (91.2 kg) today and has not lost weight since her last visit. She has lost 0 lbs since starting treatment with [name].  Vitamin D deficiency Morgan Long has a diagnosis of vitamin D deficiency. Morgan Long is currently taking prescription vit D. She has minimal fatigue and denies nausea, vomiting or muscle weakness.  Pre-Diabetes Morgan Long has a diagnosis of prediabetes based on her elevated Hgb A1c and was informed this puts her at greater risk of developing diabetes. Morgan Long denies any GI side effects with metformin and she continues to work on diet and exercise to decrease risk of diabetes. She denies hypoglycemia, polyphagia or polyuria.  At risk for cardiovascular disease Morgan Long is at a higher than average risk for cardiovascular disease due to obesity. She currently denies any chest pain.  ALLERGIES: Allergies  Allergen Reactions  . Acetaminophen Itching    Patient can take tylenol, just not tylenol#3  . Hydrocodone Nausea Only  . Oxycodone Itching  . Phosphate Itching    sick  . Codeine Itching and Nausea Only    MEDICATIONS: Current Outpatient Medications on File Prior to Visit  Medication Sig Dispense Refill  . albuterol (PROAIR HFA) 108 (90 Base) MCG/ACT inhaler INHALE TWO PUFFS EVERY 4-6 HOURS IF NEEDED FOR COUGH WHEEZE. 8.5 Inhaler 0  . beclomethasone (QVAR REDIHALER) 80 MCG/ACT inhaler Inhale 2 puffs 2 (two) times daily  into the lungs. 10.6 g 5  . budesonide-formoterol (SYMBICORT) 160-4.5 MCG/ACT inhaler Inhale 2 puffs 2 (two) times daily into the lungs. 1 Inhaler 5  . cetirizine (ZYRTEC) 10 MG tablet Take 1 tablet (10 mg total) daily by mouth. 30 tablet 5  . cyclobenzaprine (FLEXERIL) 10 MG tablet Take 1 tablet (10 mg total) by mouth 3 (three) times daily as needed for muscle spasms. 30 tablet 0  . ESTROGEL 0.75 MG/1.25 GM (0.06%) topical gel Apply 1 application topically See admin instructions.  0  . etonogestrel-ethinyl estradiol (NUVARING) 0.12-0.015 MG/24HR vaginal ring Place 1 each vaginally every 28 (twenty-eight) days. Insert vaginally and leave in place for 3 consecutive weeks, then remove for 1 week.     Marland Kitchen ibuprofen (ADVIL,MOTRIN) 800 MG tablet Take 800 mg by mouth every 6 (six) hours as needed. for pain  3  . LINZESS 72 MCG capsule TAKE 1 CAPSULE (72 MCG TOTAL) BY MOUTH DAILY BEFORE BREAKFAST. 30 capsule 8  . metoCLOPramide (REGLAN) 10 MG tablet Take 1 tablet (10 mg total) by mouth 4 (four) times daily. 120 tablet 1  . Multiple Vitamin (MULTIVITAMIN) LIQD Take 5 mLs by mouth daily.    Marland Kitchen omeprazole (PRILOSEC) 40 MG capsule Take 1 capsule (40 mg total) by mouth daily. 30 capsule 0  . ondansetron (ZOFRAN-ODT) 4 MG disintegrating tablet TAKE 1-2 TABLETS BY MOUTH 3 TIMES A DAY AS NEEDED 16 tablet 1  . RESTASIS MULTIDOSE 0.05 % ophthalmic emulsion Place 1 drop  into both eyes as needed.  3  . rizatriptan (MAXALT-MLT) 10 MG disintegrating tablet Take 1 tablet (10 mg total) by mouth as needed for migraine. May repeat in 2 hours if needed 9 tablet 11   No current facility-administered medications on file prior to visit.     PAST MEDICAL HISTORY: Past Medical History:  Diagnosis Date  . Allergic rhinitis   . Asthma   . Chronic migraine    Dr. Catalina Gravel  . Constipation   . Dry eye syndrome of both lacrimal glands   . Dry skin   . Endometriosis   . Fatigue   . Gastroparesis   . GERD (gastroesophageal reflux  disease)   . Headache(784.0)    occasional, dx w/ Migraines before, Topamax helps  . Heartburn   . History of stomach ulcers   . Lactose intolerance   . Localized edema   . Migraine   . Muscle weakness (generalized)   . Nuclear cataract of both eyes    Mild  . Overweight   . Pain in right ankle and joints of right foot   . Shortness of breath   . Sinus complaint   . Sinusitis   . Tired   . TMJ pain dysfunction syndrome    occasional    PAST SURGICAL HISTORY: Past Surgical History:  Procedure Laterality Date  . AUGMENTATION MAMMAPLASTY Bilateral 2006  . BREAST ENHANCEMENT SURGERY  2006  . BUNIONECTOMY    . COLONOSCOPY    . endrometroisis    . fallopian tube removed     Left  . UPPER GASTROINTESTINAL ENDOSCOPY    . wisdoim teeth extraction      SOCIAL HISTORY: Social History   Tobacco Use  . Smoking status: Never Smoker  . Smokeless tobacco: Never Used  Substance Use Topics  . Alcohol use: Yes    Alcohol/week: 0.0 standard drinks    Comment: socially - occasional   . Drug use: No    FAMILY HISTORY: Family History  Problem Relation Age of Onset  . Throat cancer Mother   . Cancer - Other Mother 55       throat - died 46 months  . Cancer Mother   . Alcoholism Mother   . Heart disease Father   . Cancer Father   . Breast cancer Maternal Aunt        breast  . Colon cancer Maternal Uncle 55       died 7  . Breast cancer Maternal Aunt        breast  . Pancreatic cancer Maternal Aunt   . Diabetes Unknown        grandmother  . Hypertension Maternal Aunt        several family members  . Throat cancer Maternal Uncle   . Asthma Unknown        cousin, maternal  . Breast cancer Maternal Aunt        total of 5 aunts  . Breast cancer Maternal Aunt   . Colon cancer Paternal Grandfather   . Heart attack Neg Hx   . Rectal cancer Neg Hx   . Stomach cancer Neg Hx     ROS: Review of Systems  Constitutional: Positive for malaise/fatigue. Negative for weight  loss.  Cardiovascular: Negative for chest pain.  Gastrointestinal: Negative for nausea and vomiting.  Genitourinary: Negative for frequency.  Musculoskeletal:       Negative for muscle weakness  Endo/Heme/Allergies:       Negative for hypoglycemia  Negative for polyphagia    PHYSICAL EXAM: Blood pressure 103/69, pulse 92, temperature 98.3 F (36.8 C), temperature source Oral, height 5\' 3"  (1.6 m), weight 201 lb (91.2 kg), SpO2 96 %. Body mass index is 35.61 kg/m. Physical Exam  Constitutional: She is oriented to person, place, and time. She appears well-developed and well-nourished.  HENT:  Head: Normocephalic and atraumatic.  Cardiovascular: Normal rate.  Pulmonary/Chest: Effort normal.  Musculoskeletal: Normal range of motion.  Neurological: She is oriented to person, place, and time.  Skin: Skin is warm and dry.  Psychiatric: She has a normal mood and affect. Her behavior is normal.  Vitals reviewed.   RECENT LABS AND TESTS: BMET    Component Value Date/Time   NA 138 08/14/2017 1152   K 4.1 08/14/2017 1152   CL 102 08/14/2017 1152   CO2 22 08/14/2017 1152   GLUCOSE 86 08/14/2017 1152   GLUCOSE 89 02/27/2017 1552   BUN 13 08/14/2017 1152   CREATININE 0.76 08/14/2017 1152   CALCIUM 9.1 08/14/2017 1152   GFRNONAA 93 08/14/2017 1152   GFRAA 107 08/14/2017 1152   Lab Results  Component Value Date   HGBA1C 5.5 08/14/2017   HGBA1C 5.6 06/09/2015   HGBA1C 5.7 (H) 05/22/2014   Lab Results  Component Value Date   INSULIN 8.0 08/14/2017   CBC    Component Value Date/Time   WBC 9.0 08/14/2017 1152   WBC 9.7 03/30/2017 1512   RBC 4.26 08/14/2017 1152   RBC 4.48 03/30/2017 1512   HGB 12.3 08/14/2017 1152   HCT 38.6 08/14/2017 1152   PLT 416 (H) 03/30/2017 1512   MCV 91 08/14/2017 1152   MCH 28.9 08/14/2017 1152   MCH 28.6 03/30/2017 1512   MCHC 31.9 08/14/2017 1152   MCHC 32.5 03/30/2017 1512   RDW 13.7 08/14/2017 1152   LYMPHSABS 2.3 08/14/2017 1152    MONOABS 0.7 02/27/2017 1552   EOSABS 0.1 08/14/2017 1152   BASOSABS 0.0 08/14/2017 1152   Iron/TIBC/Ferritin/ %Sat No results found for: IRON, TIBC, FERRITIN, IRONPCTSAT Lipid Panel     Component Value Date/Time   CHOL 188 08/14/2017 1152   TRIG 92 08/14/2017 1152   HDL 71 08/14/2017 1152   CHOLHDL 3 05/14/2014 1223   VLDL 12.2 05/14/2014 1223   LDLCALC 99 08/14/2017 1152   Hepatic Function Panel     Component Value Date/Time   PROT 7.0 08/14/2017 1152   ALBUMIN 4.2 08/14/2017 1152   AST 17 08/14/2017 1152   ALT 18 08/14/2017 1152   ALKPHOS 73 08/14/2017 1152   BILITOT 0.2 08/14/2017 1152   BILIDIR 0.0 05/14/2014 1223      Component Value Date/Time   TSH 0.976 08/14/2017 1152   TSH 1.26 05/14/2014 1223   TSH 1.45 02/06/2014 1107   Results for SAINA, WAAGE A (MRN [id number]) as of 01/23/2018 13:46  Ref. Range 08/14/2017 11:52  Vitamin D, 25-Hydroxy Latest Ref Range: 30.0 - 100.0 ng/mL 26.9 (L)   ASSESSMENT AND PLAN: Vitamin D deficiency - Plan: VITAMIN D 25 Hydroxy (Vit-D Deficiency, Fractures), Vitamin D, Ergocalciferol, (DRISDOL) 50000 units CAPS capsule  Prediabetes - Plan: Vitamin B12, CBC With Differential, Comprehensive metabolic panel, Folate, Hemoglobin A1c, Insulin, random, Lipid Panel With LDL/HDL Ratio, T4, free, TSH, T3, metFORMIN (GLUCOPHAGE) 500 MG tablet  At risk for heart disease  Class 2 severe obesity with serious comorbidity and body mass index (BMI) of 35.0 to 35.9 in adult, unspecified obesity type (Morgan Long)  PLAN:  Vitamin D Deficiency Morgan Long  was informed that low vitamin D levels contributes to fatigue and are associated with obesity, breast, and colon cancer. She agrees to continue to take prescription Vit D @50 ,000 IU every week #4 with no refills and will follow up for routine testing of vitamin D, at least 2-3 times per year. She was informed of the risk of over-replacement of vitamin D and agrees to not increase her dose unless she discusses this  with [name] first. Morgan Long agrees to follow up as directed.  Pre-Diabetes Morgan Long will continue to work on weight loss, exercise, and decreasing simple carbohydrates in her diet to help decrease the risk of diabetes. We dicussed metformin including benefits and risks. She was informed that eating too many simple carbohydrates or too many calories at one sitting increases the likelihood of GI side effects. Morgan Long requested metformin for now and a prescription was written today for 1 month refill. Morgan Long agrees to minimize refined carbohydrates and follow up with [name] as directed to monitor her progress.  Cardiovascular risk counseling Morgan Long was given extended (15 minutes) coronary artery disease prevention counseling today. She is 47 y.o. female and has risk factors for heart disease including obesity. We discussed intensive lifestyle modifications today with an emphasis on specific weight loss instructions and strategies. Pt was also informed of the importance of increasing exercise and decreasing saturated fats to help prevent heart disease.  Obesity Morgan Long is currently in the action stage of change. As such, her goal is to continue with weight loss efforts She has agreed to keep a food journal with 1400 calories and 80 grams of protein daily Morgan Long has been instructed to work up to a goal of 150 minutes of combined cardio and strengthening exercise per week or continue leg arm and abdominal exercises 30 minutes 3 times per week for weight loss and overall health benefits. We discussed the following Behavioral Modification Strategies today: keeping healthy foods in the home, decreasing simple carbohydrates  and work on meal planning and easy cooking plans  Morgan Long will return home to her routine (more meal planning). We reviewed journaling and increasing protein intake (handouts given today).  Morgan Long has agreed to follow up with our clinic in 3 weeks. She was informed of the importance of frequent follow up visits to  maximize her success with intensive lifestyle modifications for her multiple health conditions.   OBESITY BEHAVIORAL INTERVENTION VISIT  Today's visit was # 9   Starting weight: 200 lbs Starting date: 08/14/17 Today's weight : 201 lbs Today's date: 01/23/2018 Total lbs lost to date: 0 At least 15 minutes were spent on discussing the following behavioral intervention visit.   ASK: We discussed the diagnosis of obesity with Morgan Long today and Morgan Long agreed to give [name] permission to discuss obesity behavioral modification therapy today.  ASSESS: Aidan has the diagnosis of obesity and her BMI today is 35.61 Lise is in the action stage of change   ADVISE: Chablis was educated on the multiple health risks of obesity as well as the benefit of weight loss to improve her health. She was advised of the need for long term treatment and the importance of lifestyle modifications to improve her current health and to decrease her risk of future health problems.  AGREE: Multiple dietary modification options and treatment options were discussed and  Cadi agreed to follow the recommendations documented in the above note.  ARRANGE: Allesha was educated on the importance of frequent visits to treat obesity as outlined per CMS and USPSTF  guidelines and agreed to schedule her next follow up appointment today.  I, Doreene Nest, am acting as transcriptionist for Dennard Nip, MD  I have reviewed the above documentation for accuracy and completeness, and I agree with the above. -Dennard Nip, MD

## 2018-01-24 LAB — FOLATE: FOLATE: 9.6 ng/mL (ref >3.0)

## 2018-01-24 LAB — COMPREHENSIVE METABOLIC PANEL
ALK PHOS: 74 IU/L (ref 39–117)
ALT: 14 IU/L (ref 0–32)
AST: 17 IU/L (ref 0–40)
Albumin/Globulin Ratio: 1.4 (ref 1.2–2.2)
Albumin: 4.2 g/dL (ref 3.5–5.5)
BILIRUBIN TOTAL: 0.2 mg/dL (ref 0.0–1.2)
BUN/Creatinine Ratio: 19 (ref 9–23)
BUN: 16 mg/dL (ref 6–24)
CHLORIDE: 102 mmol/L (ref 96–106)
CO2: 18 mmol/L — AB (ref 20–29)
CREATININE: 0.83 mg/dL (ref 0.57–1.00)
Calcium: 9.2 mg/dL (ref 8.7–10.2)
GFR calc Af Amer: 96 mL/min/{1.73_m2} (ref >59)
GFR calc non Af Amer: 83 mL/min/{1.73_m2} (ref >59)
GLOBULIN, TOTAL: 2.9 g/dL (ref 1.5–4.5)
GLUCOSE: 75 mg/dL (ref 65–99)
Potassium: 4.5 mmol/L (ref 3.5–5.2)
SODIUM: 135 mmol/L (ref 134–144)
Total Protein: 7.1 g/dL (ref 6.0–8.5)

## 2018-01-24 LAB — VITAMIN B12: VITAMIN B 12: 486 pg/mL (ref 232–1245)

## 2018-01-24 LAB — LIPID PANEL WITH LDL/HDL RATIO
Cholesterol, Total: 217 mg/dL — ABNORMAL HIGH (ref 100–199)
HDL: 79 mg/dL (ref >39)
LDL CALC: 114 mg/dL — AB (ref 0–99)
LDL/HDL RATIO: 1.4 ratio (ref 0.0–3.2)
TRIGLYCERIDES: 121 mg/dL (ref 0–149)
VLDL CHOLESTEROL CAL: 24 mg/dL (ref 5–40)

## 2018-01-24 LAB — HEMOGLOBIN A1C
Est. average glucose Bld gHb Est-mCnc: 120 mg/dL
HEMOGLOBIN A1C: 5.8 % — AB (ref 4.8–5.6)

## 2018-01-24 LAB — CBC WITH DIFFERENTIAL
BASOS ABS: 0 10*3/uL (ref 0.0–0.2)
Basos: 0 %
EOS (ABSOLUTE): 0.1 10*3/uL (ref 0.0–0.4)
Eos: 1 %
Hematocrit: 37 % (ref 34.0–46.6)
Hemoglobin: 12.2 g/dL (ref 11.1–15.9)
IMMATURE GRANULOCYTES: 0 %
Immature Grans (Abs): 0 10*3/uL (ref 0.0–0.1)
Lymphocytes Absolute: 1.9 10*3/uL (ref 0.7–3.1)
Lymphs: 21 %
MCH: 29 pg (ref 26.6–33.0)
MCHC: 33 g/dL (ref 31.5–35.7)
MCV: 88 fL (ref 79–97)
MONOS ABS: 0.7 10*3/uL (ref 0.1–0.9)
Monocytes: 7 %
Neutrophils Absolute: 6.6 10*3/uL (ref 1.4–7.0)
Neutrophils: 71 %
RBC: 4.21 x10E6/uL (ref 3.77–5.28)
RDW: 15.4 % (ref 12.3–15.4)
WBC: 9.4 10*3/uL (ref 3.4–10.8)

## 2018-01-24 LAB — VITAMIN D 25 HYDROXY (VIT D DEFICIENCY, FRACTURES): Vit D, 25-Hydroxy: 30.8 ng/mL (ref 30.0–100.0)

## 2018-01-24 LAB — T3: T3, Total: 184 ng/dL — ABNORMAL HIGH (ref 71–180)

## 2018-01-24 LAB — INSULIN, RANDOM: INSULIN: 8.4 u[IU]/mL (ref 2.6–24.9)

## 2018-01-24 LAB — TSH: TSH: 2.16 u[IU]/mL (ref 0.450–4.500)

## 2018-01-24 LAB — T4, FREE: FREE T4: 1.07 ng/dL (ref 0.82–1.77)

## 2018-01-30 ENCOUNTER — Encounter (INDEPENDENT_AMBULATORY_CARE_PROVIDER_SITE_OTHER): Payer: Self-pay | Admitting: Family Medicine

## 2018-02-11 ENCOUNTER — Other Ambulatory Visit (INDEPENDENT_AMBULATORY_CARE_PROVIDER_SITE_OTHER): Payer: Self-pay | Admitting: Family Medicine

## 2018-02-11 DIAGNOSIS — E559 Vitamin D deficiency, unspecified: Secondary | ICD-10-CM

## 2018-02-14 ENCOUNTER — Ambulatory Visit (INDEPENDENT_AMBULATORY_CARE_PROVIDER_SITE_OTHER): Payer: BLUE CROSS/BLUE SHIELD | Admitting: Family Medicine

## 2018-02-14 VITALS — BP 108/72 | HR 96 | Temp 98.8°F | Ht 63.0 in | Wt 199.0 lb

## 2018-02-14 DIAGNOSIS — Z6835 Body mass index (BMI) 35.0-35.9, adult: Secondary | ICD-10-CM

## 2018-02-14 DIAGNOSIS — R7303 Prediabetes: Secondary | ICD-10-CM

## 2018-02-18 NOTE — Progress Notes (Signed)
Office: [phone]  /  Fax: [phone]   HPI:   Chief Complaint: OBESITY Morgan Long is here to discuss her progress with her obesity treatment plan. She is on the keep a food journal with 1400 calories and 80 grams of protein daily and is following her eating plan approximately 70 % of the time. She states she is doing arm stretches and steps for 20-30 minutes 2 times per week. Morgan Long continues to do well with weight loss. She is working on meal prep and increasing protein.  Her weight is 199 lb (90.3 kg) today and has had a weight loss of 2 pounds over a period of 3 weeks since her last visit. She has lost 1 lb since starting treatment with [name].  Pre-Diabetes Morgan Long has a diagnosis of pre-diabetes based on her elevated Hgb A1c and was informed this puts her at greater risk of developing diabetes. Morgan Long's A1c has worsened from 5.5 to 5.8. She is on metformin and denies nausea, vomiting, or hypoglycemia. She had foot surgery and has decreased exercise for a while, which may have contributed.  ALLERGIES: Allergies  Allergen Reactions  . Acetaminophen Itching    Patient can take tylenol, just not tylenol#3  . Hydrocodone Nausea Only  . Oxycodone Itching  . Phosphate Itching    sick  . Codeine Itching and Nausea Only    MEDICATIONS: Current Outpatient Medications on File Prior to Visit  Medication Sig Dispense Refill  . beclomethasone (QVAR REDIHALER) 80 MCG/ACT inhaler Inhale 2 puffs 2 (two) times daily into the lungs. 10.6 g 5  . budesonide-formoterol (SYMBICORT) 160-4.5 MCG/ACT inhaler Inhale 2 puffs 2 (two) times daily into the lungs. 1 Inhaler 5  . cetirizine (ZYRTEC) 10 MG tablet Take 1 tablet (10 mg total) daily by mouth. 30 tablet 5  . cyclobenzaprine (FLEXERIL) 10 MG tablet Take 1 tablet (10 mg total) by mouth 3 (three) times daily as needed for muscle spasms. 30 tablet 0  . ESTROGEL 0.75 MG/1.25 GM (0.06%) topical gel Apply 1 application topically See admin instructions.  0  .  etonogestrel-ethinyl estradiol (NUVARING) 0.12-0.015 MG/24HR vaginal ring Place 1 each vaginally every 28 (twenty-eight) days. Insert vaginally and leave in place for 3 consecutive weeks, then remove for 1 week.     Marland Kitchen ibuprofen (ADVIL,MOTRIN) 800 MG tablet Take 800 mg by mouth every 6 (six) hours as needed. for pain  3  . LINZESS 72 MCG capsule TAKE 1 CAPSULE (72 MCG TOTAL) BY MOUTH DAILY BEFORE BREAKFAST. 30 capsule 8  . metFORMIN (GLUCOPHAGE) 500 MG tablet TAKE 1 TABLET BY MOUTH EVERY DAY WITH BREAKFAST 30 tablet 0  . metoCLOPramide (REGLAN) 10 MG tablet Take 1 tablet (10 mg total) by mouth 4 (four) times daily. 120 tablet 1  . Multiple Vitamin (MULTIVITAMIN) LIQD Take 5 mLs by mouth daily.    Marland Kitchen omeprazole (PRILOSEC) 40 MG capsule Take 1 capsule (40 mg total) by mouth daily. 30 capsule 0  . ondansetron (ZOFRAN-ODT) 4 MG disintegrating tablet TAKE 1-2 TABLETS BY MOUTH 3 TIMES A DAY AS NEEDED 16 tablet 1  . RESTASIS MULTIDOSE 0.05 % ophthalmic emulsion Place 1 drop into both eyes as needed.  3  . rizatriptan (MAXALT-MLT) 10 MG disintegrating tablet Take 1 tablet (10 mg total) by mouth as needed for migraine. May repeat in 2 hours if needed 9 tablet 11  . Vitamin D, Ergocalciferol, (DRISDOL) 50000 units CAPS capsule Take 1 capsule (50,000 Units total) by mouth every 7 (seven) days. 4 capsule 0  No current facility-administered medications on file prior to visit.     PAST MEDICAL HISTORY: Past Medical History:  Diagnosis Date  . Allergic rhinitis   . Asthma   . Chronic migraine    Dr. Catalina Gravel  . Constipation   . Dry eye syndrome of both lacrimal glands   . Dry skin   . Endometriosis   . Fatigue   . Gastroparesis   . GERD (gastroesophageal reflux disease)   . Headache(784.0)    occasional, dx w/ Migraines before, Topamax helps  . Heartburn   . History of stomach ulcers   . Lactose intolerance   . Localized edema   . Migraine   . Muscle weakness (generalized)   . Nuclear cataract of  both eyes    Mild  . Overweight   . Pain in right ankle and joints of right foot   . Shortness of breath   . Sinus complaint   . Sinusitis   . Tired   . TMJ pain dysfunction syndrome    occasional    PAST SURGICAL HISTORY: Past Surgical History:  Procedure Laterality Date  . AUGMENTATION MAMMAPLASTY Bilateral 2006  . BREAST ENHANCEMENT SURGERY  2006  . BUNIONECTOMY    . COLONOSCOPY    . endrometroisis    . fallopian tube removed     Left  . UPPER GASTROINTESTINAL ENDOSCOPY    . wisdoim teeth extraction      SOCIAL HISTORY: Social History   Tobacco Use  . Smoking status: Never Smoker  . Smokeless tobacco: Never Used  Substance Use Topics  . Alcohol use: Yes    Alcohol/week: 0.0 standard drinks    Comment: socially - occasional   . Drug use: No    FAMILY HISTORY: Family History  Problem Relation Age of Onset  . Throat cancer Mother   . Cancer - Other Mother 55       throat - died 46 months  . Cancer Mother   . Alcoholism Mother   . Heart disease Father   . Cancer Father   . Breast cancer Maternal Aunt        breast  . Colon cancer Maternal Uncle 12       died 62  . Breast cancer Maternal Aunt        breast  . Pancreatic cancer Maternal Aunt   . Diabetes Unknown        grandmother  . Hypertension Maternal Aunt        several family members  . Throat cancer Maternal Uncle   . Asthma Unknown        cousin, maternal  . Breast cancer Maternal Aunt        total of 5 aunts  . Breast cancer Maternal Aunt   . Colon cancer Paternal Grandfather   . Heart attack Neg Hx   . Rectal cancer Neg Hx   . Stomach cancer Neg Hx     ROS: Review of Systems  Constitutional: Positive for weight loss.  Gastrointestinal: Negative for nausea and vomiting.  Endo/Heme/Allergies:       Negative hypoglycemia    PHYSICAL EXAM: Blood pressure 108/72, pulse 96, temperature 98.8 F (37.1 C), temperature source Oral, height 5\' 3"  (1.6 m), weight 199 lb (90.3 kg), SpO2 96  %. Body mass index is 35.25 kg/m. Physical Exam  Constitutional: She is oriented to person, place, and time. She appears well-developed and well-nourished.  Cardiovascular: Normal rate.  Pulmonary/Chest: Effort normal.  Musculoskeletal: Normal range  of motion.  Neurological: She is oriented to person, place, and time.  Skin: Skin is warm and dry.  Psychiatric: She has a normal mood and affect. Her behavior is normal.  Vitals reviewed.   RECENT LABS AND TESTS: BMET    Component Value Date/Time   NA 135 01/23/2018 1155   K 4.5 01/23/2018 1155   CL 102 01/23/2018 1155   CO2 18 (L) 01/23/2018 1155   GLUCOSE 75 01/23/2018 1155   GLUCOSE 89 02/27/2017 1552   BUN 16 01/23/2018 1155   CREATININE 0.83 01/23/2018 1155   CALCIUM 9.2 01/23/2018 1155   GFRNONAA 83 01/23/2018 1155   GFRAA 96 01/23/2018 1155   Lab Results  Component Value Date   HGBA1C 5.8 (H) 01/23/2018   HGBA1C 5.5 08/14/2017   HGBA1C 5.6 06/09/2015   HGBA1C 5.7 (H) 05/22/2014   Lab Results  Component Value Date   INSULIN 8.4 01/23/2018   INSULIN 8.0 08/14/2017   CBC    Component Value Date/Time   WBC 9.4 01/23/2018 1155   WBC 9.7 03/30/2017 1512   RBC 4.21 01/23/2018 1155   RBC 4.48 03/30/2017 1512   HGB 12.2 01/23/2018 1155   HCT 37.0 01/23/2018 1155   PLT 416 (H) 03/30/2017 1512   MCV 88 01/23/2018 1155   MCH 29.0 01/23/2018 1155   MCH 28.6 03/30/2017 1512   MCHC 33.0 01/23/2018 1155   MCHC 32.5 03/30/2017 1512   RDW 15.4 01/23/2018 1155   LYMPHSABS 1.9 01/23/2018 1155   MONOABS 0.7 02/27/2017 1552   EOSABS 0.1 01/23/2018 1155   BASOSABS 0.0 01/23/2018 1155   Iron/TIBC/Ferritin/ %Sat No results found for: IRON, TIBC, FERRITIN, IRONPCTSAT Lipid Panel     Component Value Date/Time   CHOL 217 (H) 01/23/2018 1155   TRIG 121 01/23/2018 1155   HDL 79 01/23/2018 1155   CHOLHDL 3 05/14/2014 1223   VLDL 12.2 05/14/2014 1223   LDLCALC 114 (H) 01/23/2018 1155   Hepatic Function Panel      Component Value Date/Time   PROT 7.1 01/23/2018 1155   ALBUMIN 4.2 01/23/2018 1155   AST 17 01/23/2018 1155   ALT 14 01/23/2018 1155   ALKPHOS 74 01/23/2018 1155   BILITOT 0.2 01/23/2018 1155   BILIDIR 0.0 05/14/2014 1223      Component Value Date/Time   TSH 2.160 01/23/2018 1155   TSH 0.976 08/14/2017 1152   TSH 1.26 05/14/2014 1223    ASSESSMENT AND PLAN: Prediabetes  Class 2 severe obesity with serious comorbidity and body mass index (BMI) of 35.0 to 35.9 in adult, unspecified obesity type ([place])  PLAN:  Pre-Diabetes Sherlonda will continue to work on weight loss, exercise, and decreasing simple carbohydrates in her diet to help decrease the risk of diabetes. We dicussed metformin including benefits and risks. She was informed that eating too many simple carbohydrates or too many calories at one sitting increases the likelihood of GI side effects. Marisa agrees to continue taking metformin and diet, and we will recheck labs in 3 months. Fatuma agrees to follow up with our clinic in 3 to 4 weeks as directed to monitor her progress.  I spent > than 50% of the 15 minute visit on counseling as documented in the note.  Obesity Dailey is currently in the action stage of change. As such, her goal is to continue with weight loss efforts She has agreed to keep a food journal with 1200-1400 calories and 80+ grams of protein daily Cathi has been instructed to work  up to a goal of 150 minutes of combined cardio and strengthening exercise per week for weight loss and overall health benefits. We discussed the following Behavioral Modification Strategies today: increasing lean protein intake, decreasing simple carbohydrates  and work on meal planning and easy cooking plans   Zohar has agreed to follow up with our clinic in 3 to 4 weeks. She was informed of the importance of frequent follow up visits to maximize her success with intensive lifestyle modifications for her multiple health  conditions.   OBESITY BEHAVIORAL INTERVENTION VISIT  Today's visit was # 10   Starting weight: 200 lbs Starting date: 08/14/17 Today's weight : 199 lbs  Today's date: 02/14/2018 Total lbs lost to date: 1    ASK: We discussed the diagnosis of obesity with [place] today and Jobeth agreed to give [name] permission to discuss obesity behavioral modification therapy today.  ASSESS: Jolena has the diagnosis of obesity and her BMI today is 35.26 Nocole is in the action stage of change   ADVISE: Angeletta was educated on the multiple health risks of obesity as well as the benefit of weight loss to improve her health. She was advised of the need for long term treatment and the importance of lifestyle modifications to improve her current health and to decrease her risk of future health problems.  AGREE: Multiple dietary modification options and treatment options were discussed and  Yoona agreed to follow the recommendations documented in the above note.  ARRANGE: Zabdi was educated on the importance of frequent visits to treat obesity as outlined per CMS and USPSTF guidelines and agreed to schedule her next follow up appointment today.  I, Trixie Dredge, am acting as transcriptionist for Dennard Nip, MD  I have reviewed the above documentation for accuracy and completeness, and I agree with the above. -Dennard Nip, MD

## 2018-02-20 DIAGNOSIS — S93492S Sprain of other ligament of left ankle, sequela: Secondary | ICD-10-CM | POA: Diagnosis not present

## 2018-02-20 DIAGNOSIS — M6281 Muscle weakness (generalized): Secondary | ICD-10-CM | POA: Diagnosis not present

## 2018-02-20 DIAGNOSIS — Z7409 Other reduced mobility: Secondary | ICD-10-CM | POA: Diagnosis not present

## 2018-02-20 DIAGNOSIS — M25675 Stiffness of left foot, not elsewhere classified: Secondary | ICD-10-CM | POA: Diagnosis not present

## 2018-03-01 DIAGNOSIS — M6281 Muscle weakness (generalized): Secondary | ICD-10-CM | POA: Diagnosis not present

## 2018-03-01 DIAGNOSIS — S93492S Sprain of other ligament of left ankle, sequela: Secondary | ICD-10-CM | POA: Diagnosis not present

## 2018-03-01 DIAGNOSIS — Z7409 Other reduced mobility: Secondary | ICD-10-CM | POA: Diagnosis not present

## 2018-03-01 DIAGNOSIS — M25675 Stiffness of left foot, not elsewhere classified: Secondary | ICD-10-CM | POA: Diagnosis not present

## 2018-03-05 DIAGNOSIS — Z9889 Other specified postprocedural states: Secondary | ICD-10-CM | POA: Insufficient documentation

## 2018-03-06 DIAGNOSIS — L700 Acne vulgaris: Secondary | ICD-10-CM | POA: Diagnosis not present

## 2018-03-06 DIAGNOSIS — Z5181 Encounter for therapeutic drug level monitoring: Secondary | ICD-10-CM | POA: Diagnosis not present

## 2018-03-06 DIAGNOSIS — L720 Epidermal cyst: Secondary | ICD-10-CM | POA: Diagnosis not present

## 2018-03-10 ENCOUNTER — Other Ambulatory Visit: Payer: Self-pay | Admitting: Internal Medicine

## 2018-03-11 ENCOUNTER — Encounter: Payer: Self-pay | Admitting: Family Medicine

## 2018-03-12 ENCOUNTER — Encounter: Payer: Self-pay | Admitting: Family Medicine

## 2018-03-12 ENCOUNTER — Ambulatory Visit (INDEPENDENT_AMBULATORY_CARE_PROVIDER_SITE_OTHER): Payer: BLUE CROSS/BLUE SHIELD | Admitting: Family Medicine

## 2018-03-12 VITALS — BP 108/62 | HR 76 | Temp 98.5°F | Resp 16 | Ht 63.0 in | Wt 204.4 lb

## 2018-03-12 DIAGNOSIS — Z23 Encounter for immunization: Secondary | ICD-10-CM | POA: Diagnosis not present

## 2018-03-12 DIAGNOSIS — M25512 Pain in left shoulder: Secondary | ICD-10-CM | POA: Insufficient documentation

## 2018-03-12 DIAGNOSIS — M7582 Other shoulder lesions, left shoulder: Secondary | ICD-10-CM | POA: Diagnosis not present

## 2018-03-12 DIAGNOSIS — M542 Cervicalgia: Secondary | ICD-10-CM | POA: Diagnosis not present

## 2018-03-12 NOTE — Assessment & Plan Note (Signed)
Pt to see ortho for shoulder today rto prn

## 2018-03-12 NOTE — Patient Instructions (Signed)
Shoulder Pain Many things can cause shoulder pain, including:  An injury to the area.  Overuse of the shoulder.  Arthritis.  The source of the pain can be:  Inflammation.  An injury to the shoulder joint.  An injury to a tendon, ligament, or bone.  Follow these instructions at home: Take these actions to help with your pain:  Squeeze a soft ball or a foam pad as much as possible. This helps to keep the shoulder from swelling. It also helps to strengthen the arm.  Take over-the-counter and prescription medicines only as told by your health care provider.  If directed, apply ice to the area: ? Put ice in a plastic bag. ? Place a towel between your skin and the bag. ? Leave the ice on for 20 minutes, 2-3 times per day. Stop applying ice if it does not help with the pain.  If you were given a shoulder sling or immobilizer: ? Wear it as told. ? Remove it to shower or bathe. ? Move your arm as little as possible, but keep your hand moving to prevent swelling.  Contact a health care provider if:  Your pain gets worse.  Your pain is not relieved with medicines.  New pain develops in your arm, hand, or fingers. Get help right away if:  Your arm, hand, or fingers: ? Tingle. ? Become numb. ? Become swollen. ? Become painful. ? Turn white or blue. This information is not intended to replace advice given to you by your health care provider. Make sure you discuss any questions you have with your health care provider. Document Released: 03/01/2005 Document Revised: 01/16/2016 Document Reviewed: 09/14/2014 Elsevier Interactive Patient Education  2018 [employer] Inc[employer]  

## 2018-03-12 NOTE — Progress Notes (Signed)
Patient ID: Morgan Long, female    DOB: Jan 23, 1971  Age: 47 y.o. MRN: [id number]    Subjective:  Subjective  HPI Morgan Long presents for L shoulder pain --- first time was about 3 years ago with a hand stand. It improved with PT / chiropractor and it has occurred 2 other times ==== the last time started a week ago . The chiropractor feels it is a torn rotator cuff.    Review of Systems  Constitutional: Negative for activity change, appetite change, fatigue and unexpected weight change.  Respiratory: Negative for cough and shortness of breath.   Cardiovascular: Negative for chest pain and palpitations.  Musculoskeletal:       + l shoulder pain   Psychiatric/Behavioral: Negative for behavioral problems and dysphoric mood. The patient is not nervous/anxious.     History Past Medical History:  Diagnosis Date  . Allergic rhinitis   . Asthma   . Chronic migraine    Dr. Catalina Gravel  . Constipation   . Dry eye syndrome of both lacrimal glands   . Dry skin   . Endometriosis   . Fatigue   . Gastroparesis   . GERD (gastroesophageal reflux disease)   . Headache(784.0)    occasional, dx w/ Migraines before, Topamax helps  . Heartburn   . History of stomach ulcers   . Lactose intolerance   . Localized edema   . Migraine   . Muscle weakness (generalized)   . Nuclear cataract of both eyes    Mild  . Overweight   . Pain in right ankle and joints of right foot   . Shortness of breath   . Sinus complaint   . Sinusitis   . Tired   . TMJ pain dysfunction syndrome    occasional    She has a past surgical history that includes fallopian tube removed; Breast enhancement surgery (2006); endrometroisis; Augmentation mammaplasty (Bilateral, 2006); wisdoim teeth extraction; Colonoscopy; Upper gastrointestinal endoscopy; and Bunionectomy.   Her family history includes Alcoholism in her mother; Asthma in her unknown relative; Breast cancer in her maternal aunt, maternal aunt, maternal aunt,  and maternal aunt; Cancer in her father and mother; Cancer - Other (age of onset: 12) in her mother; Colon cancer in her paternal grandfather; Colon cancer (age of onset: 22) in her maternal uncle; Diabetes in her unknown relative; Heart disease in her father; Hypertension in her maternal aunt; Pancreatic cancer in her maternal aunt; Throat cancer in her maternal uncle and mother.She reports that she has never smoked. She has never used smokeless tobacco. She reports that she drinks alcohol. She reports that she does not use drugs.  Current Outpatient Medications on File Prior to Visit  Medication Sig Dispense Refill  . beclomethasone (QVAR REDIHALER) 80 MCG/ACT inhaler Inhale 2 puffs 2 (two) times daily into the lungs. 10.6 g 5  . budesonide-formoterol (SYMBICORT) 160-4.5 MCG/ACT inhaler Inhale 2 puffs 2 (two) times daily into the lungs. 1 Inhaler 5  . cetirizine (ZYRTEC) 10 MG tablet Take 1 tablet (10 mg total) daily by mouth. 30 tablet 5  . cyclobenzaprine (FLEXERIL) 10 MG tablet Take 1 tablet (10 mg total) by mouth 3 (three) times daily as needed for muscle spasms. 30 tablet 0  . ESTROGEL 0.75 MG/1.25 GM (0.06%) topical gel Apply 1 application topically See admin instructions.  0  . etonogestrel-ethinyl estradiol (NUVARING) 0.12-0.015 MG/24HR vaginal ring Place 1 each vaginally every 28 (twenty-eight) days. Insert vaginally and leave in place for 3 consecutive  weeks, then remove for 1 week.     Marland Kitchen ibuprofen (ADVIL,MOTRIN) 800 MG tablet Take 800 mg by mouth every 6 (six) hours as needed. for pain  3  . LINZESS 72 MCG capsule TAKE 1 CAPSULE (72 MCG TOTAL) BY MOUTH DAILY BEFORE BREAKFAST. 30 capsule 8  . metFORMIN (GLUCOPHAGE) 500 MG tablet TAKE 1 TABLET BY MOUTH EVERY DAY WITH BREAKFAST 30 tablet 0  . metoCLOPramide (REGLAN) 10 MG tablet Take 1 tablet (10 mg total) by mouth 4 (four) times daily. 120 tablet 1  . Multiple Vitamin (MULTIVITAMIN) LIQD Take 5 mLs by mouth daily.    Marland Kitchen omeprazole (PRILOSEC)  40 MG capsule Take 1 capsule (40 mg total) by mouth daily. 30 capsule 0  . ondansetron (ZOFRAN-ODT) 4 MG disintegrating tablet TAKE 1-2 TABLETS BY MOUTH 3 TIMES A DAY AS NEEDED 16 tablet 1  . RESTASIS MULTIDOSE 0.05 % ophthalmic emulsion Place 1 drop into both eyes as needed.  3  . rizatriptan (MAXALT-MLT) 10 MG disintegrating tablet Take 1 tablet (10 mg total) by mouth as needed for migraine. May repeat in 2 hours if needed 9 tablet 11  . Vitamin D, Ergocalciferol, (DRISDOL) 50000 units CAPS capsule Take 1 capsule (50,000 Units total) by mouth every 7 (seven) days. 4 capsule 0   No current facility-administered medications on file prior to visit.      Objective:  Objective  Physical Exam  Constitutional: She is oriented to person, place, and time. She appears well-developed and well-nourished.  HENT:  Head: Normocephalic and atraumatic.  Eyes: Conjunctivae and EOM are normal.  Neck: Normal range of motion. Neck supple. No JVD present. Carotid bruit is not present. No thyromegaly present.  Cardiovascular: Normal rate, regular rhythm and normal heart sounds.  No murmur heard. Pulmonary/Chest: Effort normal and breath sounds normal. No respiratory distress. She has no wheezes. She has no rales. She exhibits no tenderness.  Musculoskeletal: She exhibits tenderness. She exhibits no edema.       Right shoulder: She exhibits normal range of motion and no tenderness.       Left shoulder: She exhibits decreased range of motion and tenderness.       Arms: Neurological: She is alert and oriented to person, place, and time.  Psychiatric: She has a normal mood and affect.  Nursing note and vitals reviewed.  BP 108/62 (BP Location: Left Arm, Cuff Size: Large)   Pulse 76   Temp 98.5 F (36.9 C) (Oral)   Resp 16   Ht 5\' 3"  (1.6 m)   Wt 204 lb 6.4 oz (92.7 kg)   LMP 02/07/2018   SpO2 97%   BMI 36.21 kg/m  Wt Readings from Last 3 Encounters:  03/12/18 204 lb 6.4 oz (92.7 kg)  02/14/18 199 lb  (90.3 kg)  01/23/18 201 lb (91.2 kg)     Lab Results  Component Value Date   WBC 9.4 01/23/2018   HGB 12.2 01/23/2018   HCT 37.0 01/23/2018   PLT 416 (H) 03/30/2017   GLUCOSE 75 01/23/2018   CHOL 217 (H) 01/23/2018   TRIG 121 01/23/2018   HDL 79 01/23/2018   LDLCALC 114 (H) 01/23/2018   ALT 14 01/23/2018   AST 17 01/23/2018   NA 135 01/23/2018   K 4.5 01/23/2018   CL 102 01/23/2018   CREATININE 0.83 01/23/2018   BUN 16 01/23/2018   CO2 18 (L) 01/23/2018   TSH 2.160 01/23/2018   HGBA1C 5.8 (H) 01/23/2018    Dg Chest  2 View  Result Date: 03/21/2017 CLINICAL DATA:  Shortness of breath, productive cough, and wheezing for the past 4 days. History of asthma, sinusitis, nonsmoker. EXAM: CHEST  2 VIEW COMPARISON:  Chest x-ray of August 04, 2016 FINDINGS: The lungs are adequately inflated. The interstitial markings are coarse bilaterally but not greatly changed allowing for differences in radiographic technique. The heart and pulmonary vascularity are normal. The mediastinum is normal in width. There is no pleural effusion. There are bilateral breast implants. The bony structures are unremarkable. IMPRESSION: Mild chronic interstitial prominence consistent with the history of reactive airway disease. One cannot exclude superimposed acute bronchitis. There is no alveolar pneumonia nor CHF. Electronically Signed   By: David  Martinique M.D.   On: 03/21/2017 07:52     Assessment & Plan:  Plan  I am having Brooklen A. Macmurray maintain her etonogestrel-ethinyl estradiol, multivitamin, ESTROGEL, LINZESS, RESTASIS MULTIDOSE, ibuprofen, beclomethasone, cetirizine, budesonide-formoterol, rizatriptan, cyclobenzaprine, omeprazole, metoCLOPramide, ondansetron, Vitamin D (Ergocalciferol), and metFORMIN.  No orders of the defined types were placed in this encounter.   Problem List Items Addressed This Visit      Unprioritized   Acute pain of left shoulder - Primary    Pt to see ortho for shoulder  today rto prn        Other Visit Diagnoses    Need for Tdap vaccination       Relevant Orders   Tdap vaccine greater than or equal to 7yo IM (Completed)   Influenza vaccine administered       Relevant Orders   Flu Vaccine QUAD 6+ mos PF IM (Fluarix Quad PF) (Completed)      Follow-up: Return if symptoms worsen or fail to improve.  [name], DO

## 2018-03-13 ENCOUNTER — Other Ambulatory Visit: Payer: Self-pay | Admitting: Sports Medicine

## 2018-03-13 DIAGNOSIS — M542 Cervicalgia: Secondary | ICD-10-CM

## 2018-03-13 NOTE — Telephone Encounter (Signed)
Patient seen in office on 03/12/18

## 2018-03-14 ENCOUNTER — Ambulatory Visit (INDEPENDENT_AMBULATORY_CARE_PROVIDER_SITE_OTHER): Payer: BLUE CROSS/BLUE SHIELD | Admitting: Family Medicine

## 2018-03-14 VITALS — BP 113/72 | HR 82 | Temp 98.6°F | Ht 63.0 in | Wt 197.0 lb

## 2018-03-14 DIAGNOSIS — K3184 Gastroparesis: Secondary | ICD-10-CM | POA: Diagnosis not present

## 2018-03-14 DIAGNOSIS — Z9189 Other specified personal risk factors, not elsewhere classified: Secondary | ICD-10-CM | POA: Diagnosis not present

## 2018-03-14 DIAGNOSIS — R7303 Prediabetes: Secondary | ICD-10-CM | POA: Diagnosis not present

## 2018-03-14 DIAGNOSIS — Z6834 Body mass index (BMI) 34.0-34.9, adult: Secondary | ICD-10-CM

## 2018-03-14 DIAGNOSIS — E669 Obesity, unspecified: Secondary | ICD-10-CM

## 2018-03-14 MED ORDER — METFORMIN HCL 500 MG PO TABS: ORAL_TABLET | ORAL | 0 refills | Status: DC

## 2018-03-14 MED ORDER — NALTREXONE-BUPROPION HCL ER 8-90 MG PO TB12: 2.0000 | ORAL_TABLET | Freq: Two times a day (BID) | ORAL | 0 refills | Status: DC

## 2018-03-18 ENCOUNTER — Encounter: Payer: Self-pay | Admitting: *Deleted

## 2018-03-18 NOTE — Progress Notes (Signed)
Office: [phone]  /  Fax: [phone]   HPI:   Chief Complaint: OBESITY Morgan Long is here to discuss her progress with her obesity treatment plan. She is on the  keep a food journal with 1200-1400 calories and 80+g of protein  and is following her eating plan approximately 75 % of the time. She states she is exercising by doing resistance bands and stretching for 45 minutes 3 times per week.  Morgan Long is losing weight while on Contrave and noted decreased hunger. She is still struggling with cravings but this has improved.   Her weight is 197 lb (89.4 kg) today and has had a weight loss of 2 pounds over a period of 4 weeks since her last visit. She has lost 3 lbs since starting treatment with [name].  Gastroparesis  Morgan Long notes increased abdominal discomfort which is associated with a flare-up of gastroparesis. This has worsened in the last 3-4 days. Denies vomiting and fever.   Pre-Diabetes Morgan Long has a diagnosis of prediabetes based on her elevated HgA1c and was informed this puts her at greater risk of developing diabetes. She is taking metformin currently and continues to work on diet and exercise to decrease risk of diabetes. She denies nausea or hypoglycemia.  At risk for diabetes Morgan Long is at higher than averagerisk for developing diabetes due to her obesity. She currently denies polyuria or polydipsia.  ALLERGIES: Allergies  Allergen Reactions  . Acetaminophen Itching    Patient can take tylenol, just not tylenol#3  . Hydrocodone Nausea Only  . Oxycodone Itching  . Phosphate Itching    sick  . Codeine Itching and Nausea Only    MEDICATIONS: Current Outpatient Medications on File Prior to Visit  Medication Sig Dispense Refill  . beclomethasone (QVAR REDIHALER) 80 MCG/ACT inhaler Inhale 2 puffs 2 (two) times daily into the lungs. 10.6 g 5  . budesonide-formoterol (SYMBICORT) 160-4.5 MCG/ACT inhaler Inhale 2 puffs 2 (two) times daily into the lungs. 1 Inhaler 5  . cetirizine (ZYRTEC)  10 MG tablet Take 1 tablet (10 mg total) daily by mouth. 30 tablet 5  . cyclobenzaprine (FLEXERIL) 10 MG tablet Take 1 tablet (10 mg total) by mouth 3 (three) times daily as needed for muscle spasms. 30 tablet 0  . ESTROGEL 0.75 MG/1.25 GM (0.06%) topical gel Apply 1 application topically See admin instructions.  0  . etonogestrel-ethinyl estradiol (NUVARING) 0.12-0.015 MG/24HR vaginal ring Place 1 each vaginally every 28 (twenty-eight) days. Insert vaginally and leave in place for 3 consecutive weeks, then remove for 1 week.     Marland Kitchen ibuprofen (ADVIL,MOTRIN) 800 MG tablet Take 800 mg by mouth every 6 (six) hours as needed. for pain  3  . LINZESS 72 MCG capsule TAKE 1 CAPSULE (72 MCG TOTAL) BY MOUTH DAILY BEFORE BREAKFAST. 30 capsule 8  . metoCLOPramide (REGLAN) 10 MG tablet Take 1 tablet (10 mg total) by mouth 4 (four) times daily. 120 tablet 1  . Multiple Vitamin (MULTIVITAMIN) LIQD Take 5 mLs by mouth daily.    Marland Kitchen omeprazole (PRILOSEC) 40 MG capsule Take 1 capsule (40 mg total) by mouth daily. 30 capsule 0  . ondansetron (ZOFRAN-ODT) 4 MG disintegrating tablet TAKE 1-2 TABLETS BY MOUTH 3 TIMES A DAY AS NEEDED 16 tablet 1  . RESTASIS MULTIDOSE 0.05 % ophthalmic emulsion Place 1 drop into both eyes as needed.  3  . rizatriptan (MAXALT-MLT) 10 MG disintegrating tablet Take 1 tablet (10 mg total) by mouth as needed for migraine. May repeat in 2  hours if needed 9 tablet 11  . Vitamin D, Ergocalciferol, (DRISDOL) 50000 units CAPS capsule Take 1 capsule (50,000 Units total) by mouth every 7 (seven) days. 4 capsule 0   No current facility-administered medications on file prior to visit.     PAST MEDICAL HISTORY: Past Medical History:  Diagnosis Date  . Allergic rhinitis   . Asthma   . Chronic migraine    Dr. Catalina Gravel  . Constipation   . Dry eye syndrome of both lacrimal glands   . Dry skin   . Endometriosis   . Fatigue   . Gastroparesis   . GERD (gastroesophageal reflux disease)   .  Headache(784.0)    occasional, dx w/ Migraines before, Topamax helps  . Heartburn   . History of stomach ulcers   . Lactose intolerance   . Localized edema   . Migraine   . Muscle weakness (generalized)   . Nuclear cataract of both eyes    Mild  . Overweight   . Pain in right ankle and joints of right foot   . Shortness of breath   . Sinus complaint   . Sinusitis   . Tired   . TMJ pain dysfunction syndrome    occasional    PAST SURGICAL HISTORY: Past Surgical History:  Procedure Laterality Date  . AUGMENTATION MAMMAPLASTY Bilateral 2006  . BREAST ENHANCEMENT SURGERY  2006  . BUNIONECTOMY    . COLONOSCOPY    . endrometroisis    . fallopian tube removed     Left  . UPPER GASTROINTESTINAL ENDOSCOPY    . [place]do[place] teeth extraction      SOCIAL HISTORY: Social History   Tobacco Use  . Smoking status: Never Smoker  . Smokeless tobacco: Never Used  Substance Use Topics  . Alcohol use: Yes    Alcohol/week: 0.0 standard drinks    Comment: socially - occasional   . Drug use: No    FAMILY HISTORY: Family History  Problem Relation Age of Onset  . Throat cancer Mother   . Cancer - Other Mother 55       throat - died 75 months  . Cancer Mother   . Alcoholism Mother   . Heart disease Father   . Cancer Father   . Breast cancer Maternal Aunt        breast  . Colon cancer Maternal Uncle 62       died 43  . Breast cancer Maternal Aunt        breast  . Pancreatic cancer Maternal Aunt   . Diabetes Unknown        grandmother  . Hypertension Maternal Aunt        several family members  . Throat cancer Maternal Uncle   . Asthma Unknown        cousin, maternal  . Breast cancer Maternal Aunt        total of 5 aunts  . Breast cancer Maternal Aunt   . Colon cancer Paternal Grandfather   . Heart attack Neg Hx   . Rectal cancer Neg Hx   . Stomach cancer Neg Hx     ROS: Review of Systems  Constitutional: Positive for weight loss. Negative for fever.  Gastrointestinal:  Negative for vomiting.  Endo/Heme/Allergies:       Negative for hypoglycemia.     PHYSICAL EXAM: Blood pressure 113/72, pulse 82, temperature 98.6 F (37 C), temperature source Oral, height 5\' 3"  (1.6 m), weight 197 lb (89.4 kg), SpO2 96 %.  Body mass index is 34.9 kg/m. Physical Exam  Constitutional: She is oriented to person, place, and time. She appears well-developed and well-nourished.  HENT:  Head: Normocephalic.  Neck: Normal range of motion.  Cardiovascular: Normal rate.  Pulmonary/Chest: Effort normal.  Musculoskeletal: Normal range of motion.  Neurological: She is alert and oriented to person, place, and time.  Skin: Skin is warm and dry.  Psychiatric: She has a normal mood and affect. Her behavior is normal.  Vitals reviewed.   RECENT LABS AND TESTS: BMET    Component Value Date/Time   NA 135 01/23/2018 1155   K 4.5 01/23/2018 1155   CL 102 01/23/2018 1155   CO2 18 (L) 01/23/2018 1155   GLUCOSE 75 01/23/2018 1155   GLUCOSE 89 02/27/2017 1552   BUN 16 01/23/2018 1155   CREATININE 0.83 01/23/2018 1155   CALCIUM 9.2 01/23/2018 1155   GFRNONAA 83 01/23/2018 1155   GFRAA 96 01/23/2018 1155   Lab Results  Component Value Date   HGBA1C 5.8 (H) 01/23/2018   HGBA1C 5.5 08/14/2017   HGBA1C 5.6 06/09/2015   HGBA1C 5.7 (H) 05/22/2014   Lab Results  Component Value Date   INSULIN 8.4 01/23/2018   INSULIN 8.0 08/14/2017   CBC    Component Value Date/Time   WBC 9.4 01/23/2018 1155   WBC 9.7 03/30/2017 1512   RBC 4.21 01/23/2018 1155   RBC 4.48 03/30/2017 1512   HGB 12.2 01/23/2018 1155   HCT 37.0 01/23/2018 1155   PLT 416 (H) 03/30/2017 1512   MCV 88 01/23/2018 1155   MCH 29.0 01/23/2018 1155   MCH 28.6 03/30/2017 1512   MCHC 33.0 01/23/2018 1155   MCHC 32.5 03/30/2017 1512   RDW 15.4 01/23/2018 1155   LYMPHSABS 1.9 01/23/2018 1155   MONOABS 0.7 02/27/2017 1552   EOSABS 0.1 01/23/2018 1155   BASOSABS 0.0 01/23/2018 1155   Iron/TIBC/Ferritin/  %Sat No results found for: IRON, TIBC, FERRITIN, IRONPCTSAT Lipid Panel     Component Value Date/Time   CHOL 217 (H) 01/23/2018 1155   TRIG 121 01/23/2018 1155   HDL 79 01/23/2018 1155   CHOLHDL 3 05/14/2014 1223   VLDL 12.2 05/14/2014 1223   LDLCALC 114 (H) 01/23/2018 1155   Hepatic Function Panel     Component Value Date/Time   PROT 7.1 01/23/2018 1155   ALBUMIN 4.2 01/23/2018 1155   AST 17 01/23/2018 1155   ALT 14 01/23/2018 1155   ALKPHOS 74 01/23/2018 1155   BILITOT 0.2 01/23/2018 1155   BILIDIR 0.0 05/14/2014 1223      Component Value Date/Time   TSH 2.160 01/23/2018 1155   TSH 0.976 08/14/2017 1152   TSH 1.26 05/14/2014 1223    ASSESSMENT AND PLAN: Gastroparesis  Prediabetes - Plan: metFORMIN (GLUCOPHAGE) 500 MG tablet  At risk for diabetes mellitus  Class 1 obesity with serious comorbidity and body mass index (BMI) of 34.0 to 34.9 in adult, unspecified obesity type - Plan: Naltrexone-buPROPion HCl ER 8-90 MG TB12  PLAN: Gastroparesis Avya encouraged to decrease food volume and eat very small amounts of food every 2-3 hours. Will continue to follow, we may need to refer to GI. Agrees to follow up with our clinic as directed.   Pre-Diabetes Morgan Long will continue to work on weight loss, exercise, and decreasing simple carbohydrates in her diet to help decrease the risk of diabetes. We dicussed metformin including benefits and risks. She was informed that eating too many simple carbohydrates or too many calories at  one sitting increases the likelihood of GI side effects. Morgan Long agrees to continue Metformin 500 mg daily #30 with no refills.  Morgan Long agreed to follow up with [name] as directed to monitor her progress.  Diabetes risk counseling Morgan Long was given extended (15 minutes) diabetes prevention counseling today. She is 47 y.o. female and has risk factors for diabetes including obesity. We discussed intensive lifestyle modifications today with an emphasis on weight loss as  well as increasing exercise and decreasing simple carbohydrates in her diet.  Obesity Morgan Long is currently in the action stage of change. As such, her goal is to continue with weight loss efforts She has agreed to keep a food journal with 1400 calories and 80+g of protein  Morgan Long has been instructed to work up to a goal of 150 minutes of combined cardio and strengthening exercise per week for weight loss and overall health benefits. We discussed the following Behavioral Modification Strategies today: increasing lean protein intake and decreasing simple carbohydrates  We discussed various medication options to help Zailynn with her weight loss efforts and we both agreed to continue Contrave 8-90 mg 2 tabs BID #120 with no refills.   Morgan Long has agreed to follow up with our clinic in 3 weeks. She was informed of the importance of frequent follow up visits to maximize her success with intensive lifestyle modifications for her multiple health conditions.   OBESITY BEHAVIORAL INTERVENTION VISIT  Today's visit was # 11   Starting weight: 200 lb Starting date: 08/14/17 Today's weight : 197 lb Today's date: 03/14/18 Total lbs lost to date: 3 lb    ASK: We discussed the diagnosis of obesity with Morgan Long today and Morgan Long agreed to give [name] permission to discuss obesity behavioral modification therapy today.  ASSESS: Morgan Long has the diagnosis of obesity and her BMI today is 34.91 Morgan Long is in the action stage of change   ADVISE: Morgan Long was educated on the multiple health risks of obesity as well as the benefit of weight loss to improve her health. She was advised of the need for long term treatment and the importance of lifestyle modifications to improve her current health and to decrease her risk of future health problems.  AGREE: Multiple dietary modification options and treatment options were discussed and  Morgan Long agreed to follow the recommendations documented in the above note.  ARRANGE: Morgan Long was  educated on the importance of frequent visits to treat obesity as outlined per CMS and USPSTF guidelines and agreed to schedule her next follow up appointment today.  I, Renee Ramus, am acting as transcriptionist for Dennard Nip, MD   I have reviewed the above documentation for accuracy and completeness, and I agree with the above. -Dennard Nip, MD

## 2018-03-19 ENCOUNTER — Encounter: Payer: Self-pay | Admitting: Family Medicine

## 2018-03-19 DIAGNOSIS — H43393 Other vitreous opacities, bilateral: Secondary | ICD-10-CM | POA: Diagnosis not present

## 2018-03-19 DIAGNOSIS — H16223 Keratoconjunctivitis sicca, not specified as Sjogren's, bilateral: Secondary | ICD-10-CM | POA: Diagnosis not present

## 2018-03-19 DIAGNOSIS — H2513 Age-related nuclear cataract, bilateral: Secondary | ICD-10-CM | POA: Diagnosis not present

## 2018-03-19 DIAGNOSIS — H04123 Dry eye syndrome of bilateral lacrimal glands: Secondary | ICD-10-CM | POA: Diagnosis not present

## 2018-03-23 ENCOUNTER — Ambulatory Visit
Admission: RE | Admit: 2018-03-23 | Discharge: 2018-03-23 | Disposition: A | Discharge status: Home / Self Care | Payer: BLUE CROSS/BLUE SHIELD | Source: Ambulatory Visit | Attending: Sports Medicine | Admitting: Sports Medicine

## 2018-03-23 DIAGNOSIS — M542 Cervicalgia: Secondary | ICD-10-CM

## 2018-03-23 DIAGNOSIS — M19012 Primary osteoarthritis, left shoulder: Secondary | ICD-10-CM | POA: Diagnosis not present

## 2018-03-23 DIAGNOSIS — M4802 Spinal stenosis, cervical region: Secondary | ICD-10-CM | POA: Diagnosis not present

## 2018-03-25 ENCOUNTER — Telehealth: Payer: Self-pay | Admitting: *Deleted

## 2018-03-25 ENCOUNTER — Encounter: Payer: Self-pay | Admitting: Family Medicine

## 2018-03-25 DIAGNOSIS — Z9889 Other specified postprocedural states: Secondary | ICD-10-CM | POA: Diagnosis not present

## 2018-03-25 DIAGNOSIS — M7582 Other shoulder lesions, left shoulder: Secondary | ICD-10-CM | POA: Diagnosis not present

## 2018-03-25 DIAGNOSIS — M542 Cervicalgia: Secondary | ICD-10-CM | POA: Diagnosis not present

## 2018-03-25 DIAGNOSIS — M25572 Pain in left ankle and joints of left foot: Secondary | ICD-10-CM | POA: Diagnosis not present

## 2018-03-25 NOTE — Telephone Encounter (Signed)
Spoke with patient to let her know that we need her job duties.  She will send via mychart.

## 2018-03-26 ENCOUNTER — Telehealth: Payer: Self-pay | Admitting: Neurology

## 2018-03-26 DIAGNOSIS — Z7409 Other reduced mobility: Secondary | ICD-10-CM | POA: Diagnosis not present

## 2018-03-26 DIAGNOSIS — X58XXXS Exposure to other specified factors, sequela: Secondary | ICD-10-CM | POA: Diagnosis not present

## 2018-03-26 DIAGNOSIS — M6281 Muscle weakness (generalized): Secondary | ICD-10-CM | POA: Diagnosis not present

## 2018-03-26 DIAGNOSIS — S93492S Sprain of other ligament of left ankle, sequela: Secondary | ICD-10-CM | POA: Diagnosis not present

## 2018-03-26 DIAGNOSIS — M25675 Stiffness of left foot, not elsewhere classified: Secondary | ICD-10-CM | POA: Diagnosis not present

## 2018-03-26 NOTE — Telephone Encounter (Signed)
I called and requested a refill on the patients botox at [phone]. DW

## 2018-03-27 ENCOUNTER — Telehealth: Payer: Self-pay | Admitting: *Deleted

## 2018-03-27 NOTE — Telephone Encounter (Signed)
Left message on machine that we just need to get her to come in to sign release of medical.  We will then fax.

## 2018-03-28 ENCOUNTER — Ambulatory Visit (INDEPENDENT_AMBULATORY_CARE_PROVIDER_SITE_OTHER): Payer: BLUE CROSS/BLUE SHIELD | Admitting: Family Medicine

## 2018-03-28 VITALS — BP 112/75 | HR 92 | Temp 98.8°F | Ht 63.0 in | Wt 197.0 lb

## 2018-03-28 DIAGNOSIS — F3289 Other specified depressive episodes: Secondary | ICD-10-CM | POA: Diagnosis not present

## 2018-03-28 DIAGNOSIS — E559 Vitamin D deficiency, unspecified: Secondary | ICD-10-CM

## 2018-03-28 DIAGNOSIS — E669 Obesity, unspecified: Secondary | ICD-10-CM | POA: Diagnosis not present

## 2018-03-28 DIAGNOSIS — Z6834 Body mass index (BMI) 34.0-34.9, adult: Secondary | ICD-10-CM

## 2018-03-28 DIAGNOSIS — Z9189 Other specified personal risk factors, not elsewhere classified: Secondary | ICD-10-CM | POA: Diagnosis not present

## 2018-03-28 MED ORDER — VITAMIN D (ERGOCALCIFEROL) 1.25 MG (50000 UNIT) PO CAPS: 50000.0000 [IU] | ORAL_CAPSULE | ORAL | 0 refills | Status: DC

## 2018-03-28 NOTE — Telephone Encounter (Signed)
See phone message

## 2018-04-01 NOTE — Telephone Encounter (Signed)
I called to check status of the patient's Botox medication. The representative informed me that it is still pending. DW

## 2018-04-01 NOTE — Progress Notes (Signed)
Office: [phone]  /  Fax: [phone]   HPI:   Chief Complaint: OBESITY Morgan Long is here to discuss her progress with her obesity treatment plan. She is on the  keep a food journal with 1400 calories and 80 protein  and is following her eating plan approximately 60 % of the time. She states she is exercising 30 minutes 2 times per week. Morgan Long is fully journaling four days out of seven. She may log in numerous places and wants to consecutively take contrave daily. She is hitting her goals when journaling.   Her weight is 197 lb (89.4 kg) today and has had a weight loss of 0 pounds over a period of 2 weeks since her last visit. She has lost 3 lbs since starting treatment with [name].  Vitamin D deficiency Morgan Long has a diagnosis of vitamin D deficiency. She is currently taking vit D and denies nausea, vomiting or muscle weakness. She is positive for fatigue.   Depression with emotional eating behaviors Morgan Long is struggling with emotional eating and using food for comfort to the extent that it is negatively impacting her health. She often snacks when she is not hungry. Morgan Long sometimes feels she is out of control and then feels guilty that she made poor food choices. She has been working on behavior modification techniques to help reduce her emotional eating and has been very successful. She shows no sign of suicidal or homicidal ideations. Has not been taking Contrave as prescribed.   Depression screen [facility] 2/9 08/14/2017 06/12/2017 03/01/2015  Decreased Interest 3 0 0  Down, Depressed, Hopeless 3 0 0  PHQ - 2 Score 6 0 0  Altered sleeping 3 - -  Tired, decreased energy 3 - -  Change in appetite 2 - -  Feeling bad or failure about yourself  2 - -  Trouble concentrating 1 - -  Moving slowly or fidgety/restless 1 - -  Suicidal thoughts 0 - -  PHQ-9 Score 18 - -  Difficult doing work/chores Very difficult - -  Some recent data might be hidden   At risk for osteopenia and osteoporosis Morgan Long is at higher  risk of osteopenia and osteoporosis due to vitamin D deficiency.    ALLERGIES: Allergies  Allergen Reactions  . Acetaminophen Itching    Patient can take tylenol, just not tylenol#3  . Hydrocodone Nausea Only  . Oxycodone Itching  . Phosphate Itching    sick  . Codeine Itching and Nausea Only    MEDICATIONS: Current Outpatient Medications on File Prior to Visit  Medication Sig Dispense Refill  . beclomethasone (QVAR REDIHALER) 80 MCG/ACT inhaler Inhale 2 puffs 2 (two) times daily into the lungs. 10.6 g 5  . budesonide-formoterol (SYMBICORT) 160-4.5 MCG/ACT inhaler Inhale 2 puffs 2 (two) times daily into the lungs. 1 Inhaler 5  . cetirizine (ZYRTEC) 10 MG tablet Take 1 tablet (10 mg total) daily by mouth. 30 tablet 5  . cyclobenzaprine (FLEXERIL) 10 MG tablet Take 1 tablet (10 mg total) by mouth 3 (three) times daily as needed for muscle spasms. 30 tablet 0  . ESTROGEL 0.75 MG/1.25 GM (0.06%) topical gel Apply 1 application topically See admin instructions.  0  . etonogestrel-ethinyl estradiol (NUVARING) 0.12-0.015 MG/24HR vaginal ring Place 1 each vaginally every 28 (twenty-eight) days. Insert vaginally and leave in place for 3 consecutive weeks, then remove for 1 week.     Marland Kitchen ibuprofen (ADVIL,MOTRIN) 800 MG tablet Take 800 mg by mouth every 6 (six) hours as needed.  for pain  3  . LINZESS 72 MCG capsule TAKE 1 CAPSULE (72 MCG TOTAL) BY MOUTH DAILY BEFORE BREAKFAST. 30 capsule 8  . metFORMIN (GLUCOPHAGE) 500 MG tablet TAKE 1 TABLET BY MOUTH EVERY DAY WITH BREAKFAST 30 tablet 0  . metoCLOPramide (REGLAN) 10 MG tablet Take 1 tablet (10 mg total) by mouth 4 (four) times daily. 120 tablet 1  . Multiple Vitamin (MULTIVITAMIN) LIQD Take 5 mLs by mouth daily.    . Naltrexone-buPROPion HCl ER 8-90 MG TB12 Take 2 tablets by mouth 2 (two) times daily. 120 tablet 0  . omeprazole (PRILOSEC) 40 MG capsule Take 1 capsule (40 mg total) by mouth daily. 30 capsule 0  . ondansetron (ZOFRAN-ODT) 4 MG  disintegrating tablet TAKE 1-2 TABLETS BY MOUTH 3 TIMES A DAY AS NEEDED 16 tablet 1  . RESTASIS MULTIDOSE 0.05 % ophthalmic emulsion Place 1 drop into both eyes as needed.  3  . rizatriptan (MAXALT-MLT) 10 MG disintegrating tablet Take 1 tablet (10 mg total) by mouth as needed for migraine. May repeat in 2 hours if needed 9 tablet 11   No current facility-administered medications on file prior to visit.     PAST MEDICAL HISTORY: Past Medical History:  Diagnosis Date  . Allergic rhinitis   . Asthma   . Chronic migraine    Dr. Catalina Gravel  . Constipation   . Dry eye syndrome of both lacrimal glands   . Dry skin   . Endometriosis   . Fatigue   . Gastroparesis   . GERD (gastroesophageal reflux disease)   . Headache(784.0)    occasional, dx w/ Migraines before, Topamax helps  . Heartburn   . History of stomach ulcers   . Lactose intolerance   . Localized edema   . Migraine   . Muscle weakness (generalized)   . Nuclear cataract of both eyes    Mild  . Overweight   . Pain in right ankle and joints of right foot   . Shortness of breath   . Sinus complaint   . Sinusitis   . Tired   . TMJ pain dysfunction syndrome    occasional    PAST SURGICAL HISTORY: Past Surgical History:  Procedure Laterality Date  . AUGMENTATION MAMMAPLASTY Bilateral 2006  . BREAST ENHANCEMENT SURGERY  2006  . BUNIONECTOMY    . COLONOSCOPY    . endrometroisis    . fallopian tube removed     Left  . UPPER GASTROINTESTINAL ENDOSCOPY    . wisdoim teeth extraction      SOCIAL HISTORY: Social History   Tobacco Use  . Smoking status: Never Smoker  . Smokeless tobacco: Never Used  Substance Use Topics  . Alcohol use: Yes    Alcohol/week: 0.0 standard drinks    Comment: socially - occasional   . Drug use: No    FAMILY HISTORY: Family History  Problem Relation Age of Onset  . Throat cancer Mother   . Cancer - Other Mother 55       throat - died 44 months  . Cancer Mother   . Alcoholism Mother     . Heart disease Father   . Cancer Father   . Breast cancer Maternal Aunt        breast  . Colon cancer Maternal Uncle 49       died 87  . Breast cancer Maternal Aunt        breast  . Pancreatic cancer Maternal Aunt   . Diabetes Unknown  grandmother  . Hypertension Maternal Aunt        several family members  . Throat cancer Maternal Uncle   . Asthma Unknown        cousin, maternal  . Breast cancer Maternal Aunt        total of 5 aunts  . Breast cancer Maternal Aunt   . Colon cancer Paternal Grandfather   . Heart attack Neg Hx   . Rectal cancer Neg Hx   . Stomach cancer Neg Hx     ROS: Review of Systems  Constitutional: Positive for malaise/fatigue.  All other systems reviewed and are negative.   PHYSICAL EXAM: Blood pressure 112/75, pulse 92, temperature 98.8 F (37.1 C), temperature source Oral, height 5\' 3"  (1.6 m), weight 197 lb (89.4 kg), SpO2 94 %. Body mass index is 34.9 kg/m. Physical Exam  Constitutional: She is oriented to person, place, and time. She appears well-developed and well-nourished.  HENT:  Head: Normocephalic and atraumatic.  Eyes: Pupils are equal, round, and reactive to light.  Neck: Normal range of motion.  Pulmonary/Chest: Effort normal.  Musculoskeletal: Normal range of motion.  Neurological: She is alert and oriented to person, place, and time.  Skin: Skin is warm and dry.  Psychiatric: She has a normal mood and affect. Her behavior is normal.  Vitals reviewed.   RECENT LABS AND TESTS: BMET    Component Value Date/Time   NA 135 01/23/2018 1155   K 4.5 01/23/2018 1155   CL 102 01/23/2018 1155   CO2 18 (L) 01/23/2018 1155   GLUCOSE 75 01/23/2018 1155   GLUCOSE 89 02/27/2017 1552   BUN 16 01/23/2018 1155   CREATININE 0.83 01/23/2018 1155   CALCIUM 9.2 01/23/2018 1155   GFRNONAA 83 01/23/2018 1155   GFRAA 96 01/23/2018 1155   Lab Results  Component Value Date   HGBA1C 5.8 (H) 01/23/2018   HGBA1C 5.5 08/14/2017    HGBA1C 5.6 06/09/2015   HGBA1C 5.7 (H) 05/22/2014   Lab Results  Component Value Date   INSULIN 8.4 01/23/2018   INSULIN 8.0 08/14/2017   CBC    Component Value Date/Time   WBC 9.4 01/23/2018 1155   WBC 9.7 03/30/2017 1512   RBC 4.21 01/23/2018 1155   RBC 4.48 03/30/2017 1512   HGB 12.2 01/23/2018 1155   HCT 37.0 01/23/2018 1155   PLT 416 (H) 03/30/2017 1512   MCV 88 01/23/2018 1155   MCH 29.0 01/23/2018 1155   MCH 28.6 03/30/2017 1512   MCHC 33.0 01/23/2018 1155   MCHC 32.5 03/30/2017 1512   RDW 15.4 01/23/2018 1155   LYMPHSABS 1.9 01/23/2018 1155   MONOABS 0.7 02/27/2017 1552   EOSABS 0.1 01/23/2018 1155   BASOSABS 0.0 01/23/2018 1155   Iron/TIBC/Ferritin/ %Sat No results found for: IRON, TIBC, FERRITIN, IRONPCTSAT Lipid Panel     Component Value Date/Time   CHOL 217 (H) 01/23/2018 1155   TRIG 121 01/23/2018 1155   HDL 79 01/23/2018 1155   CHOLHDL 3 05/14/2014 1223   VLDL 12.2 05/14/2014 1223   LDLCALC 114 (H) 01/23/2018 1155   Hepatic Function Panel     Component Value Date/Time   PROT 7.1 01/23/2018 1155   ALBUMIN 4.2 01/23/2018 1155   AST 17 01/23/2018 1155   ALT 14 01/23/2018 1155   ALKPHOS 74 01/23/2018 1155   BILITOT 0.2 01/23/2018 1155   BILIDIR 0.0 05/14/2014 1223      Component Value Date/Time   TSH 2.160 01/23/2018 1155   TSH  0.976 08/14/2017 1152   TSH 1.26 05/14/2014 1223    ASSESSMENT AND PLAN: Vitamin D deficiency - Plan: Vitamin D, Ergocalciferol, (DRISDOL) 50000 units CAPS capsule  Other depression - with emotional eating  At risk for osteoporosis  Class 1 obesity with serious comorbidity and body mass index (BMI) of 34.0 to 34.9 in adult, unspecified obesity type  PLAN: Vitamin D Deficiency Morgan Long was informed that low vitamin D levels contributes to fatigue and are associated with obesity, breast, and colon cancer. She agrees to continue to take prescription Vit D @50 ,000 IU every week in which a prescription was written for 30  day supply and will follow up for routine testing of vitamin D, at least 2-3 times per year. She was informed of the risk of over-replacement of vitamin D and agrees to not increase her dose unless she discusses this with [name] first.  Depression with Emotional Eating Behaviors We discussed behavior modification techniques today to help Morgan Long deal with her emotional eating and depression. She has agreed to restart Contrave qd and agreed to follow up as directed.  At risk for osteopenia and osteoporosis Morgan Long was given extended  (15 minutes) osteoporosis prevention counseling today. Morgan Long is at risk for osteopenia and osteoporsis due to her vitamin D deficiency. She was encouraged to take her vitamin D and follow her higher calcium diet and increase strengthening exercise to help strengthen her bones and decrease her risk of osteopenia and osteoporosis.  Obesity Morgan Long is currently in the action stage of change. As such, her goal is to continue with weight loss efforts She has agreed to keep a food journal with 1400 calories and 80g+ protein daily Morgan Long has been instructed to work up to a goal of 150 minutes of combined cardio and strengthening exercise per week for weight loss and overall health benefits. Will consistently journal for four out of 7 days and journal part of the day the other three days.  We discussed the following Behavioral Modification Strategies today: increasing lean protein intake and work on meal planning and easy cooking plans, planning for success and keep a strict food journal.   Morgan Long has agreed to follow up with our clinic in 2 weeks. She was informed of the importance of frequent follow up visits to maximize her success with intensive lifestyle modifications for her multiple health conditions.   OBESITY BEHAVIORAL INTERVENTION VISIT  Today's visit was # 12   Starting weight: 200 lbs Starting date: 08/14/17 Today's weight : Weight: 197 lb (89.4 kg)  Today's date:  03/28/18 Total lbs lost to date: 3 At least 15 minutes were spent on discussing the following behavioral intervention visit.   ASK: We discussed the diagnosis of obesity with [place] today and Zaide agreed to give [name] permission to discuss obesity behavioral modification therapy today.  ASSESS: Marieclaire has the diagnosis of obesity and her BMI today is @TBMI @ [place]ita is in the action stage of change   ADVISE: Jaima was educated on the multiple health risks of obesity as well as the benefit of weight loss to improve her health. She was advised of the need for long term treatment and the importance of lifestyle modifications to improve her current health and to decrease her risk of future health problems.  AGREE: Multiple dietary modification options and treatment options were discussed and  Otilia agreed to follow the recommendations documented in the above note.  ARRANGE: Jadin was educated on the importance of frequent visits to treat obesity as  outlined per CMS and USPSTF guidelines and agreed to schedule her next follow up appointment today.  I, April Moore, am acting as Location manager for Dr Ilene Qua.  I have reviewed the above documentation for accuracy and completeness, and I agree with the above. - Ilene Qua, MD

## 2018-04-02 DIAGNOSIS — G43719 Chronic migraine without aura, intractable, without status migrainosus: Secondary | ICD-10-CM | POA: Diagnosis not present

## 2018-04-03 ENCOUNTER — Ambulatory Visit: Payer: BLUE CROSS/BLUE SHIELD | Admitting: Neurology

## 2018-04-03 ENCOUNTER — Telehealth: Payer: Self-pay

## 2018-04-03 NOTE — Telephone Encounter (Signed)
Pt did not show for their appt with Dr. Athar today.  

## 2018-04-04 ENCOUNTER — Encounter: Payer: Self-pay | Admitting: Neurology

## 2018-04-04 ENCOUNTER — Ambulatory Visit (INDEPENDENT_AMBULATORY_CARE_PROVIDER_SITE_OTHER): Payer: Self-pay | Admitting: Family Medicine

## 2018-04-05 NOTE — Telephone Encounter (Signed)
Pt requesting a call to r/s her appt

## 2018-04-10 ENCOUNTER — Other Ambulatory Visit: Payer: Self-pay | Admitting: Obstetrics and Gynecology

## 2018-04-10 ENCOUNTER — Telehealth: Payer: Self-pay | Admitting: Neurology

## 2018-04-10 ENCOUNTER — Encounter: Payer: Self-pay | Admitting: Neurology

## 2018-04-10 ENCOUNTER — Ambulatory Visit (INDEPENDENT_AMBULATORY_CARE_PROVIDER_SITE_OTHER): Payer: BLUE CROSS/BLUE SHIELD | Admitting: Neurology

## 2018-04-10 VITALS — BP 122/76 | HR 88 | Ht 63.0 in | Wt 201.0 lb

## 2018-04-10 DIAGNOSIS — G43719 Chronic migraine without aura, intractable, without status migrainosus: Secondary | ICD-10-CM

## 2018-04-10 DIAGNOSIS — Z1231 Encounter for screening mammogram for malignant neoplasm of breast: Secondary | ICD-10-CM

## 2018-04-10 MED ORDER — ONABOTULINUMTOXINA 100 UNITS IJ SOLR
200.0000 [IU] | Freq: Once | INTRAMUSCULAR | Status: AC
  Administered 2018-04-10: 200 [IU] via INTRAMUSCULAR

## 2018-04-10 NOTE — Progress Notes (Signed)
[name] is a 47 year old right-handed woman with an underlying medical history of allergic rhinitis, endometriosis, TMJ problems, and neck pain, as well as obesity, who presents for her 4th botulinum toxin injections for her chronic intractable migraine headaches. The patient is unaccompanied today. Of note, she no showed for an appointment on 04/03/2018. I last saw her on 12/26/2017, at which time she reported having had a recent increase in her headache frequency, she had recent foot surgery.  Today, 04/10/2018: She reports having done okay with last injection, botox has been helpful, no SEs. Feels, like her HAs are more frequent in the past week or so. Of note, she is overdue by nearly 2 weeks for injection.   Written informed consent for recurrent, 3 monthly intramuscular injections with botulinum toxin for this indication has previously been obtained and scanned into the patient's electronic chart. I will re-consent if the type of botulinum toxin used or the indication for injection changes for this patient in the future. The patient is informed that we will use the same consent for her recurrent, most likely 3 monthly injections. She demonstrated understanding and voiced agreement.   I have previously talked to the patient in detail about expectations, limitations, benefits as well as potential adverse effects of botulinum toxin injections. The patient understands that the side effects include (but are not limited to): Mouth dryness, dryness of eyes, speech and swallowing difficulties, respiratory depression or problems breathing, weakness of muscles including more distant muscles than the ones injected, flu-like symptoms, myalgias, injection site reactions such as redness, itching, swelling, pain, and infection.   200 units of botulinum toxin type A were reconstituted using preservative-free normal saline to a concentration of 10 units per 0.1 mL and drawn up into 1 mL tuberculin syringes. Lot  number was [id number], expiration dates Apr 2022 for both 100 unit vials, [facility]   O/E: BP 122/76   Pulse 88   Ht _0  (1.6 m)   Wt 201 lb (91.2 kg)   BMI 35.61 kg/m   The patient was situated in a chair, sitting comfortably. After preparing the areas with 70% isopropyl alcohol and using a 26 gauge 1 1/2 inch hollow lumen recording EMG needle for the neck injections as well as a 30 gauge 1 inch needle for the facial injections, a total dose of 155 units of botulinum toxin type A in the form of Botox was injected into the muscles and the following distribution and quantities:   #1: 10 units on the right and 10 units in the left frontalis muscles, broken down in 2 sites on each side. #2: 5 units in the right and 5 units in the left corrugator muscles. #3: 15 units in the right and 15 units in the left occipitalis muscles, broken down in 3 sites on each side. #4: 20 units in the right and 20 units in the left temporalis muscles, broken down in 4 sites on each side.  #5: 15 units on the right and 15 units in the left upper trapezius muscles, broken down in 3 sites on each side. #6: 10 units in the right and 10 units in the left splenius capitis muscles, broken down in 2 sites on each side.  #7: 2.5 units in the right and 2.5 units in the left procerus muscles.   EMG guidance was utilized for the neck injections with mild EMG activity noted, especially in the splenius capitis muscles, more so on the L.    A dose of  45 units out of a total dose of 200 units was discarded as unavoidable waste.    The patient tolerated the procedure well without immediate complications. She was advised to make a followup appointment for repeat injections in 3 months from now and encouraged to call us with any interim questions, concerns, problems, or updates. She was in agreement and did not have any questions prior to leaving clinic today.   Previously:    I saw her on 09/25/2017 for her second round of  injections. She felt that in the week prior to the injection her headaches were more intense.   I saw her on 06/28/2017 for her initial Botox injection, at which time she received 155 units of Botox.     She was seen by her primary care physician and received a Toradol injection on 06/19/2017 as well as a prescription for Relpax.    04/25/17: Dr. Melton Alar retired. She reports a long-standing history of migraine headaches, she recalls she was about 47 years old when she started having migraines. For quite some time she had spontaneous improvement in her migraines but some 4 or 5 years ago started having more frequent migraines. She has tried and failed multiple preventative medications but does not have a list with her and prior records from Dr. Audery Amel office are not available today. She reports that his office is closed at this point. She did not get her records from his office. Perhaps her primary care provider has records. I encouraged her to try to obtain those records as it will help [name] manage her migraines optimally. She recalls having tried Topamax. She had hair loss from some medications but is not completely sure which ones. Topamax did not help, Zonegran caused her sedation, nortriptyline was also tried but she is unsure what happened with it. Amitriptyline she may have tried as well. She did not try Depakote. She is worried about sedation side effects as she also is going to school and has to be alert throughout the day. She takes occasional nausea medicine which likely is Zofran under the tongue. She has tried Maxalt under the tongue which is helpful at times. She needs a refill on that. She has regular eye exams because of her history of dry eyes. She used to wear a single contact but was advised to stop using it.  She has had 2 rounds of Botox injections, last time about 3 months ago as she recalls. She remembers doing okay with it. Prior to starting Botox injections she reports having about  18-20 headache days per month typically. She does not always have a typical aura but sometimes has visual blurriness and bringing in her ears to warn her about an upcoming migraine headache. She has occasional nausea that precedes the headache.   I saw her on 06/09/2015 at which time she was referred is a new patient referral for a new problem, referred by her optometrist at the time for 1 month history of blurry vision. Her exam at the time was nonfocal and reassuringly she had no significant eye related findings. I suggested further workup in the form of visual evoked potentials, blood work, and she also reported a 6 month history of feeling tired. She had no one-sided weakness, tingling or numbness. Symptoms from the past which included paresthesias had resolved completely. She was under the care of Dr. Melton Alar for migraines. She was in the process of titrating Zonegran. She was on 225 mg each night. When she took 300 mg  each night she felt too sleepy during the day. She denied any symptoms of sleep disordered breathing. She did admit not being a good sleeper. She did endorse stress as she was in school online for psychology and was also working off-and-on in Personal assistant. She reported not drinking sodas, not drinking alcohol and she reported not smoking. She did report pain with eye movements and dry eyes.  She reported no family history of multiple sclerosis or lupus. She denied joint pain with the exception of bilateral knee pain and she also reported a 50 pound weight gain in the last year. She had visual evoked potentials on 07/06/2015: Impression: The visual evoked response test above was within normal limits bilaterally. No evidence of conduction slowing was seen within the anterior visual pathways on either side on today's evaluation.   We called her with her test results. Labs from 06/09/2015 showed normal A1c, normal vitamin D level, normal ANA, normal RF, normal ESR. CRP was elevated at 15.6. 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   She had a brain MRI with and without contrast on 06/23/2015: IMPRESSION:  This is a normal MRI of the brain with and without contrast  In addition, personally reviewed the images through the PACS system. We called her with her test results.   I first met her on 05/22/2014 at the request of her neurosurgeon, Dr. Kathyrn Sheriff, at which time the patient reported intermittent right arm numbness, particularly with neck position changes. I suggested blood work and EMG and nerve conduction testing of the right upper extremity. Her blood work showed elevated B12 and B6 levels, indicative of B vitamin supplementation. Hemoglobin A1c was 5.7. We called her with her test results. She had EMG and nerve conduction testing on 06/02/2014: IMPRESSION:   Nerve conduction studies done on both upper extremities were unremarkable, without evidence of a neuropathy seen. EMG evaluation of the right upper extremity was unremarkable, without evidence of an overlying cervical radiculopathy. We called her with her test results. At the time, she reported improved symptoms.   05/22/2014: She has intermittent right arm numbness. Her symptoms have been going on for about 6 months. She has not noted any permanent numbness and no issues elsewhere. She does not have any significant weakness and sometimes feels weak when the numbness seems to come on. It goes away if she changes positions or adjusts her neck position. She has had right shoulder problems and pain in the right shoulder.   You saw her on 05/14/2014 for neck pain. She had undergone physical therapy without improvement of her neck pain. She had cervical epidural steroid injections which helped for about 24 hours as understand.   She had a C-spine MRI without contrast on  01/07/2014: Mild cervical spondylosis as described above without significant disc protrusion, foraminal stenosis or central canal stenosis.    Blood work from 05/14/2014 was reviewed: She had a BMP, CBC with differential, liver function panel, lipid panel and TSH all of which were fine with the exception of a borderline LDL of 111.

## 2018-04-10 NOTE — Telephone Encounter (Signed)
12 wk Botox inj °

## 2018-04-10 NOTE — Patient Instructions (Signed)
Please remember, botulinum toxin takes about 3-7 days to kick in. As discussed, this is not a pain shot. The purpose of the injections is to gradually improve your symptoms. In some patients it takes up to 2-3 weeks to make a difference and it wears off with time. Sometimes it may wear off before it is time for the next injection. We still should wait till the next 3 monthly injection, because injecting too frequently may cause you to develop immunity to the botulinum toxin. We are looking for a reduction in the severity and/or frequency of your symptoms. As a reminder, side effects to look out for are (but not limited to): mouth dryness, dryness of the eyes, heaviness of your head or muscle weakness, including droopy face or droopy eyelid(s), rarely: speech or swallowing difficulties and very rarely: breathing difficulties. Some people have transient neck pain or soreness which typically responds to over-the-counter anti-inflammatory medication and local heat application with a heat pad. If you think you have a severe reaction to the botulinum toxin, such as weakness, trouble speaking, trouble breathing, or trouble swallowing, you have to call 911 or have someone take you to the nearest emergency room. However, most people have either no or minimal side effects from the injections. It is normal to have a little bit of redness and swelling around the injection sites which usually improves after a few hours. Rarely, there may be a bruise that improves on its own. Most side effects reported are very mild and resolve within 10-14 days. Please feel free to call us if you have any additional questions or concerns: [phone] or email us through My Chart. We may have to adjust the dose over time, depending on your results from this injection and your overall response over time to this medication.   

## 2018-04-11 ENCOUNTER — Ambulatory Visit (INDEPENDENT_AMBULATORY_CARE_PROVIDER_SITE_OTHER): Payer: BLUE CROSS/BLUE SHIELD | Admitting: Family Medicine

## 2018-04-12 DIAGNOSIS — M25675 Stiffness of left foot, not elsewhere classified: Secondary | ICD-10-CM | POA: Diagnosis not present

## 2018-04-12 DIAGNOSIS — Z7409 Other reduced mobility: Secondary | ICD-10-CM | POA: Diagnosis not present

## 2018-04-12 DIAGNOSIS — X58XXXS Exposure to other specified factors, sequela: Secondary | ICD-10-CM | POA: Diagnosis not present

## 2018-04-12 DIAGNOSIS — M6281 Muscle weakness (generalized): Secondary | ICD-10-CM | POA: Diagnosis not present

## 2018-04-12 DIAGNOSIS — S93492S Sprain of other ligament of left ankle, sequela: Secondary | ICD-10-CM | POA: Diagnosis not present

## 2018-04-12 DIAGNOSIS — R269 Unspecified abnormalities of gait and mobility: Secondary | ICD-10-CM | POA: Diagnosis not present

## 2018-04-13 ENCOUNTER — Other Ambulatory Visit: Payer: Self-pay | Admitting: Internal Medicine

## 2018-04-15 ENCOUNTER — Encounter (INDEPENDENT_AMBULATORY_CARE_PROVIDER_SITE_OTHER): Payer: Self-pay | Admitting: Family Medicine

## 2018-04-15 ENCOUNTER — Ambulatory Visit (INDEPENDENT_AMBULATORY_CARE_PROVIDER_SITE_OTHER): Payer: BLUE CROSS/BLUE SHIELD | Admitting: Family Medicine

## 2018-04-15 VITALS — BP 106/52 | HR 98 | Temp 98.6°F | Ht 63.0 in | Wt 198.0 lb

## 2018-04-15 DIAGNOSIS — Z9189 Other specified personal risk factors, not elsewhere classified: Secondary | ICD-10-CM | POA: Diagnosis not present

## 2018-04-15 DIAGNOSIS — E559 Vitamin D deficiency, unspecified: Secondary | ICD-10-CM

## 2018-04-15 DIAGNOSIS — R7303 Prediabetes: Secondary | ICD-10-CM

## 2018-04-15 DIAGNOSIS — Z6835 Body mass index (BMI) 35.0-35.9, adult: Secondary | ICD-10-CM

## 2018-04-15 DIAGNOSIS — E8881 Metabolic syndrome: Secondary | ICD-10-CM | POA: Diagnosis not present

## 2018-04-15 MED ORDER — LORCASERIN HCL ER 20 MG PO TB24: 1.0000 | ORAL_TABLET | Freq: Every day | ORAL | 0 refills | Status: DC

## 2018-04-15 MED ORDER — VITAMIN D (ERGOCALCIFEROL) 1.25 MG (50000 UNIT) PO CAPS: 50000.0000 [IU] | ORAL_CAPSULE | ORAL | 0 refills | Status: DC

## 2018-04-16 NOTE — Progress Notes (Addendum)
Office: [phone]  /  Fax: [phone]   HPI:   Chief Complaint: OBESITY Morgan Long is here to discuss her progress with her obesity treatment plan. She is on the keep a food journal with 1400 calories and 80+ grams of protein daily plan and is following her eating plan approximately 90% of the time. She states she is doing cardio 15 minutes 4 times per week. Aleen discontinued Contrave, as it is making her nauseated. She has had no polyphagia. She would like to try another anti-obesity medication and asked for something that will giver her "energy".  Her weight is 198 lb (89.8 kg) today and has had a weight gain of 1 pound over a period of 2 weeks since her last visit. She has lost 2 lbs since starting treatment with [name].  Vitamin D deficiency Morgan Long has a diagnosis of vitamin D deficiency. Morgan Long is currently taking prescription vit D and she is not yet at goal. She denies nausea, vomiting or muscle weakness.  Prediabetes  She is taking metformin currently and continues to work on diet and exercise to decrease risk of diabetes. She denies polyphagia.  At risk for diabetes Morgan Long is at higher than average risk for developing diabetes due to her obesity and insulin resistance. She currently denies polyuria or polydipsia.  ALLERGIES: Allergies  Allergen Reactions  . Acetaminophen Itching    Patient can take tylenol, just not tylenol#3  . Hydrocodone Nausea Only  . Oxycodone Itching  . Phosphate Itching    sick  . Codeine Itching and Nausea Only    MEDICATIONS: Current Outpatient Medications on File Prior to Visit  Medication Sig Dispense Refill  . beclomethasone (QVAR REDIHALER) 80 MCG/ACT inhaler Inhale 2 puffs 2 (two) times daily into the lungs. 10.6 g 5  . budesonide-formoterol (SYMBICORT) 160-4.5 MCG/ACT inhaler Inhale 2 puffs 2 (two) times daily into the lungs. 1 Inhaler 5  . cetirizine (ZYRTEC) 10 MG tablet Take 1 tablet (10 mg total) daily by mouth. 30 tablet 5  . ESTROGEL 0.75  MG/1.25 GM (0.06%) topical gel Apply 1 application topically See admin instructions.  0  . etonogestrel-ethinyl estradiol (NUVARING) 0.12-0.015 MG/24HR vaginal ring Place 1 each vaginally every 28 (twenty-eight) days. Insert vaginally and leave in place for 3 consecutive weeks, then remove for 1 week.     Marland Kitchen ibuprofen (ADVIL,MOTRIN) 800 MG tablet Take 800 mg by mouth every 6 (six) hours as needed. for pain  3  . LINZESS 72 MCG capsule TAKE 1 CAPSULE (72 MCG TOTAL) BY MOUTH DAILY BEFORE BREAKFAST. 30 capsule 8  . metFORMIN (GLUCOPHAGE) 500 MG tablet TAKE 1 TABLET BY MOUTH EVERY DAY WITH BREAKFAST 30 tablet 0  . metoCLOPramide (REGLAN) 10 MG tablet Take 1 tablet (10 mg total) by mouth 4 (four) times daily. 120 tablet 1  . Multiple Vitamin (MULTIVITAMIN) LIQD Take 5 mLs by mouth daily.    Marland Kitchen omeprazole (PRILOSEC) 40 MG capsule Take 1 capsule (40 mg total) by mouth daily. 30 capsule 0  . ondansetron (ZOFRAN-ODT) 4 MG disintegrating tablet TAKE 1-2 TABLETS BY MOUTH 3 TIMES A DAY AS NEEDED 16 tablet 1  . RESTASIS MULTIDOSE 0.05 % ophthalmic emulsion Place 1 drop into both eyes as needed.  3   No current facility-administered medications on file prior to visit.     PAST MEDICAL HISTORY: Past Medical History:  Diagnosis Date  . Allergic rhinitis   . Asthma   . Chronic migraine    Dr. Catalina Gravel  . Constipation   .  Dry eye syndrome of both lacrimal glands   . Dry skin   . Endometriosis   . Fatigue   . Gastroparesis   . GERD (gastroesophageal reflux disease)   . Headache(784.0)    occasional, dx w/ Migraines before, Topamax helps  . Heartburn   . History of stomach ulcers   . Lactose intolerance   . Localized edema   . Migraine   . Muscle weakness (generalized)   . Nuclear cataract of both eyes    Mild  . Overweight   . Pain in right ankle and joints of right foot   . Shortness of breath   . Sinus complaint   . Sinusitis   . Tired   . TMJ pain dysfunction syndrome    occasional    PAST  SURGICAL HISTORY: Past Surgical History:  Procedure Laterality Date  . AUGMENTATION MAMMAPLASTY Bilateral 2006  . BREAST ENHANCEMENT SURGERY  2006  . BUNIONECTOMY    . COLONOSCOPY    . endrometroisis    . fallopian tube removed     Left  . UPPER GASTROINTESTINAL ENDOSCOPY    . wisdoim teeth extraction      SOCIAL HISTORY: Social History   Tobacco Use  . Smoking status: Never Smoker  . Smokeless tobacco: Never Used  Substance Use Topics  . Alcohol use: Yes    Alcohol/week: 0.0 standard drinks    Comment: socially - occasional   . Drug use: No    FAMILY HISTORY: Family History  Problem Relation Age of Onset  . Throat cancer Mother   . Cancer - Other Mother 55       throat - died 56 months  . Cancer Mother   . Alcoholism Mother   . Heart disease Father   . Cancer Father   . Breast cancer Maternal Aunt        breast  . Colon cancer Maternal Uncle 19       died 34  . Breast cancer Maternal Aunt        breast  . Pancreatic cancer Maternal Aunt   . Diabetes Unknown        grandmother  . Hypertension Maternal Aunt        several family members  . Throat cancer Maternal Uncle   . Asthma Unknown        cousin, maternal  . Breast cancer Maternal Aunt        total of 5 aunts  . Breast cancer Maternal Aunt   . Colon cancer Paternal Grandfather   . Heart attack Neg Hx   . Rectal cancer Neg Hx   . Stomach cancer Neg Hx     ROS: Review of Systems  Constitutional: Negative for weight loss.  Gastrointestinal: Negative for nausea and vomiting.  Genitourinary: Negative for frequency.  Musculoskeletal:       Negative for muscle weakness  Endo/Heme/Allergies: Negative for polydipsia.       Negative for polyphagia    PHYSICAL EXAM: Blood pressure (!) 106/52, pulse 98, temperature 98.6 F (37 C), temperature source Oral, height 5\' 3"  (1.6 m), weight 198 lb (89.8 kg), SpO2 100 %. Body mass index is 35.07 kg/m. Physical Exam  Constitutional: She is oriented to  person, place, and time. She appears well-developed and well-nourished.  Cardiovascular: Normal rate.  Pulmonary/Chest: Effort normal.  Musculoskeletal: Normal range of motion.  Neurological: She is oriented to person, place, and time.  Skin: Skin is warm and dry.  Psychiatric: She has a  normal mood and affect. Her behavior is normal.  Vitals reviewed.   RECENT LABS AND TESTS: BMET    Component Value Date/Time   NA 135 01/23/2018 1155   K 4.5 01/23/2018 1155   CL 102 01/23/2018 1155   CO2 18 (L) 01/23/2018 1155   GLUCOSE 75 01/23/2018 1155   GLUCOSE 89 02/27/2017 1552   BUN 16 01/23/2018 1155   CREATININE 0.83 01/23/2018 1155   CALCIUM 9.2 01/23/2018 1155   GFRNONAA 83 01/23/2018 1155   GFRAA 96 01/23/2018 1155   Lab Results  Component Value Date   HGBA1C 5.8 (H) 01/23/2018   HGBA1C 5.5 08/14/2017   HGBA1C 5.6 06/09/2015   HGBA1C 5.7 (H) 05/22/2014   Lab Results  Component Value Date   INSULIN 8.4 01/23/2018   INSULIN 8.0 08/14/2017   CBC    Component Value Date/Time   WBC 9.4 01/23/2018 1155   WBC 9.7 03/30/2017 1512   RBC 4.21 01/23/2018 1155   RBC 4.48 03/30/2017 1512   HGB 12.2 01/23/2018 1155   HCT 37.0 01/23/2018 1155   PLT 416 (H) 03/30/2017 1512   MCV 88 01/23/2018 1155   MCH 29.0 01/23/2018 1155   MCH 28.6 03/30/2017 1512   MCHC 33.0 01/23/2018 1155   MCHC 32.5 03/30/2017 1512   RDW 15.4 01/23/2018 1155   LYMPHSABS 1.9 01/23/2018 1155   MONOABS 0.7 02/27/2017 1552   EOSABS 0.1 01/23/2018 1155   BASOSABS 0.0 01/23/2018 1155   Iron/TIBC/Ferritin/ %Sat No results found for: IRON, TIBC, FERRITIN, IRONPCTSAT Lipid Panel     Component Value Date/Time   CHOL 217 (H) 01/23/2018 1155   TRIG 121 01/23/2018 1155   HDL 79 01/23/2018 1155   CHOLHDL 3 05/14/2014 1223   VLDL 12.2 05/14/2014 1223   LDLCALC 114 (H) 01/23/2018 1155   Hepatic Function Panel     Component Value Date/Time   PROT 7.1 01/23/2018 1155   ALBUMIN 4.2 01/23/2018 1155   AST 17  01/23/2018 1155   ALT 14 01/23/2018 1155   ALKPHOS 74 01/23/2018 1155   BILITOT 0.2 01/23/2018 1155   BILIDIR 0.0 05/14/2014 1223      Component Value Date/Time   TSH 2.160 01/23/2018 1155   TSH 0.976 08/14/2017 1152   TSH 1.26 05/14/2014 1223   Results for VEIDA, SPIRA A (MRN [id number]) as of 04/16/2018 16:25  Ref. Range 01/23/2018 11:55  Vitamin D, 25-Hydroxy Latest Ref Range: 30.0 - 100.0 ng/mL 30.8   ASSESSMENT AND PLAN: Vitamin D deficiency - Plan: Vitamin D, Ergocalciferol, (DRISDOL) 1.25 MG (50000 UT) CAPS capsule  Prediabetes  At risk for diabetes mellitus  Class 2 severe obesity with serious comorbidity and body mass index (BMI) of 35.0 to 35.9 in adult, unspecified obesity type ([place]) - Plan: Lorcaserin HCl ER (BELVIQ XR) 20 MG TB24  PLAN:  Vitamin D Deficiency Morgan Long was informed that low vitamin D levels contributes to fatigue and are associated with obesity, breast, and colon cancer. She agrees to continue to take prescription Vit D @50 ,000 IU every week #4 with no refills and we will check vitamin D level at the next visit. She will follow up for routine testing of vitamin D, at least 2-3 times per year. She was informed of the risk of over-replacement of vitamin D and agrees to not increase her dose unless she discusses this with [name] first. Morgan Long agrees to follow up as directed.  Pre-Diabetes Morgan Long will continue to work on weight loss, exercise, and decreasing simple carbohydrates in  her diet to help decrease the risk of diabetes. We dicussed metformin including benefits and risks. Morgan Long will continue metformin for now. today. Morgan Long agreed to follow up with [name] as directed to monitor her progress.   Diabetes risk counseling Morgan Long was given extended (15 minutes) diabetes prevention counseling today. She is 47 y.o. female and has risk factors for diabetes including obesity and insulin resistance. We discussed intensive lifestyle modifications today with an emphasis on weight  loss as well as increasing exercise and decreasing simple carbohydrates in her diet.  Obesity Morgan Long is currently in the action stage of change. As such, her goal is to continue with weight loss efforts She has agreed to keep a food journal with 1400 calories and 80+ grams of protein daily Morgan Long has been instructed to work up to a goal of 150 minutes of combined cardio and strengthening exercise per week for weight loss and overall health benefits. We discussed the following Behavioral Modification Strategies today: increasing lean protein intake, increase H2O intake and no skipping meals We discussed various medication options to help Morgan Long with her weight loss efforts. She has taken phentermine in the past. Morgan Long is not a good option considering her diagnosis of gastroparesis. We both agreed to start Belviq XR 20 mg daily #30 with no refills.  Morgan Long has agreed to follow up with our clinic in 2 weeks. She was informed of the importance of frequent follow up visits to maximize her success with intensive lifestyle modifications for her multiple health conditions.   OBESITY BEHAVIORAL INTERVENTION VISIT  Today's visit was # 13  Starting weight: 200 lbs Starting date: 08/14/2017 Today's weight : 198 lbs  Today's date: 04/15/2018 Total lbs lost to date: 2   ASK: We discussed the diagnosis of obesity with Morgan Long today and Morgan Long agreed to give [name] permission to discuss obesity behavioral modification therapy today.  ASSESS: Morgan Long has the diagnosis of obesity and her BMI today is 35.08 Morgan Long is in the action stage of change   ADVISE: Morgan Long was educated on the multiple health risks of obesity as well as the benefit of weight loss to improve her health. She was advised of the need for long term treatment and the importance of lifestyle modifications to improve her current health and to decrease her risk of future health problems.  AGREE: Multiple dietary modification options and treatment  options were discussed and  Morgan Long agreed to follow the recommendations documented in the above note.  ARRANGE: Morgan Long was educated on the importance of frequent visits to treat obesity as outlined per CMS and USPSTF guidelines and agreed to schedule her next follow up appointment today.  Corey Skains, am acting as Location manager for [employer], FNP-C.  I have reviewed the above documentation for accuracy and completeness, and I agree with the above.  - Lariah Fleer, FNP-C.

## 2018-04-17 ENCOUNTER — Encounter (INDEPENDENT_AMBULATORY_CARE_PROVIDER_SITE_OTHER): Payer: Self-pay | Admitting: Family Medicine

## 2018-04-17 NOTE — Telephone Encounter (Signed)
I called and scheduled the patient. DW  °

## 2018-04-26 DIAGNOSIS — Z7409 Other reduced mobility: Secondary | ICD-10-CM | POA: Diagnosis not present

## 2018-04-26 DIAGNOSIS — M6281 Muscle weakness (generalized): Secondary | ICD-10-CM | POA: Diagnosis not present

## 2018-04-26 DIAGNOSIS — M25675 Stiffness of left foot, not elsewhere classified: Secondary | ICD-10-CM | POA: Diagnosis not present

## 2018-04-26 DIAGNOSIS — R269 Unspecified abnormalities of gait and mobility: Secondary | ICD-10-CM | POA: Diagnosis not present

## 2018-04-26 DIAGNOSIS — S93492S Sprain of other ligament of left ankle, sequela: Secondary | ICD-10-CM | POA: Diagnosis not present

## 2018-04-26 DIAGNOSIS — X58XXXS Exposure to other specified factors, sequela: Secondary | ICD-10-CM | POA: Diagnosis not present

## 2018-04-29 ENCOUNTER — Ambulatory Visit (INDEPENDENT_AMBULATORY_CARE_PROVIDER_SITE_OTHER): Payer: BLUE CROSS/BLUE SHIELD | Admitting: Family Medicine

## 2018-05-04 ENCOUNTER — Other Ambulatory Visit: Payer: Self-pay | Admitting: Family Medicine

## 2018-05-04 ENCOUNTER — Other Ambulatory Visit: Payer: Self-pay | Admitting: Neurology

## 2018-05-06 ENCOUNTER — Ambulatory Visit (INDEPENDENT_AMBULATORY_CARE_PROVIDER_SITE_OTHER): Payer: BLUE CROSS/BLUE SHIELD | Admitting: Family Medicine

## 2018-05-06 ENCOUNTER — Other Ambulatory Visit: Payer: Self-pay | Admitting: Allergy and Immunology

## 2018-05-06 ENCOUNTER — Encounter (INDEPENDENT_AMBULATORY_CARE_PROVIDER_SITE_OTHER): Payer: Self-pay | Admitting: Family Medicine

## 2018-05-06 VITALS — BP 119/77 | HR 78 | Temp 98.2°F | Ht 63.0 in | Wt 198.0 lb

## 2018-05-06 DIAGNOSIS — R7303 Prediabetes: Secondary | ICD-10-CM

## 2018-05-06 DIAGNOSIS — J3089 Other allergic rhinitis: Secondary | ICD-10-CM

## 2018-05-06 DIAGNOSIS — E7849 Other hyperlipidemia: Secondary | ICD-10-CM

## 2018-05-06 DIAGNOSIS — R7989 Other specified abnormal findings of blood chemistry: Secondary | ICD-10-CM | POA: Diagnosis not present

## 2018-05-06 DIAGNOSIS — Z6835 Body mass index (BMI) 35.0-35.9, adult: Secondary | ICD-10-CM

## 2018-05-06 DIAGNOSIS — E559 Vitamin D deficiency, unspecified: Secondary | ICD-10-CM | POA: Diagnosis not present

## 2018-05-06 DIAGNOSIS — Z9189 Other specified personal risk factors, not elsewhere classified: Secondary | ICD-10-CM | POA: Diagnosis not present

## 2018-05-06 MED ORDER — VITAMIN D (ERGOCALCIFEROL) 1.25 MG (50000 UNIT) PO CAPS: 50000.0000 [IU] | ORAL_CAPSULE | ORAL | 0 refills | Status: DC

## 2018-05-06 MED ORDER — LORCASERIN HCL ER 20 MG PO TB24: 1.0000 | ORAL_TABLET | Freq: Every day | ORAL | 0 refills | Status: DC

## 2018-05-07 LAB — COMPREHENSIVE METABOLIC PANEL
ALT: 15 IU/L (ref 0–32)
AST: 15 IU/L (ref 0–40)
Albumin/Globulin Ratio: 1.5 (ref 1.2–2.2)
Albumin: 4.1 g/dL (ref 3.5–5.5)
Alkaline Phosphatase: 84 IU/L (ref 39–117)
BUN/Creatinine Ratio: 20 (ref 9–23)
BUN: 14 mg/dL (ref 6–24)
Bilirubin Total: <0.2 mg/dL (ref 0.0–1.2)
CO2: 19 mmol/L — ABNORMAL LOW (ref 20–29)
Calcium: 8.8 mg/dL (ref 8.7–10.2)
Chloride: 103 mmol/L (ref 96–106)
Creatinine, Ser: 0.71 mg/dL (ref 0.57–1.00)
GFR calc Af Amer: 116 mL/min/{1.73_m2} (ref >59)
GFR calc non Af Amer: 100 mL/min/{1.73_m2} (ref >59)
Globulin, Total: 2.8 g/dL (ref 1.5–4.5)
Glucose: 82 mg/dL (ref 65–99)
Potassium: 4.7 mmol/L (ref 3.5–5.2)
Sodium: 140 mmol/L (ref 134–144)
Total Protein: 6.9 g/dL (ref 6.0–8.5)

## 2018-05-07 LAB — LIPID PANEL WITH LDL/HDL RATIO
Cholesterol, Total: 191 mg/dL (ref 100–199)
HDL: 65 mg/dL (ref >39)
LDL Calculated: 104 mg/dL — ABNORMAL HIGH (ref 0–99)
LDl/HDL Ratio: 1.6 ratio (ref 0.0–3.2)
Triglycerides: 109 mg/dL (ref 0–149)
VLDL Cholesterol Cal: 22 mg/dL (ref 5–40)

## 2018-05-07 LAB — T4, FREE: Free T4: 1.06 ng/dL (ref 0.82–1.77)

## 2018-05-07 LAB — TSH: TSH: 0.922 u[IU]/mL (ref 0.450–4.500)

## 2018-05-07 LAB — VITAMIN D 25 HYDROXY (VIT D DEFICIENCY, FRACTURES): Vit D, 25-Hydroxy: 34 ng/mL (ref 30.0–100.0)

## 2018-05-07 LAB — INSULIN, RANDOM: INSULIN: 11.2 u[IU]/mL (ref 2.6–24.9)

## 2018-05-07 LAB — T3: T3, Total: 157 ng/dL (ref 71–180)

## 2018-05-07 LAB — HEMOGLOBIN A1C
Est. average glucose Bld gHb Est-mCnc: 123 mg/dL
Hgb A1c MFr Bld: 5.9 % — ABNORMAL HIGH (ref 4.8–5.6)

## 2018-05-09 ENCOUNTER — Other Ambulatory Visit: Payer: Self-pay | Admitting: Family Medicine

## 2018-05-09 ENCOUNTER — Encounter (INDEPENDENT_AMBULATORY_CARE_PROVIDER_SITE_OTHER): Payer: Self-pay | Admitting: Family Medicine

## 2018-05-09 DIAGNOSIS — J3089 Other allergic rhinitis: Secondary | ICD-10-CM

## 2018-05-09 NOTE — Progress Notes (Signed)
Office: [phone]  /  Fax: [phone]   HPI:   Chief Complaint: OBESITY Morgan Long is here to discuss her progress with her obesity treatment plan. She is on the  keep a food journal with 1400 calories and 80+g of protein  daily and is following her eating plan approximately 50 % of the time. She states she is exercising by doing resistance bands and squats for 30 minutes 2 times per week. Lilie has not started Belviq. She reports the pharmacy did not receive the prescription.  She struggles with hunger cravings at night.  Her weight is 198 lb (89.8 kg) today and has maintained weight since her last visit. She has lost 2 lbs since starting treatment with [name].  Elevated T4 level  Lova's T4 level was elevated on thyroid panel. She denies heat/cold intolerance and palpitations. She admits to ongoing fatigue.   Vitamin D deficiency Derya has a diagnosis of vitamin D deficiency, not at goal.  She is currently taking vit D and denies nausea, vomiting or muscle weakness.  Ref. Range 01/23/2018 11:55  Vitamin D, 25-Hydroxy Latest Ref Range: 30.0 - 100.0 ng/mL 30.8   At risk for osteopenia and osteoporosis Hazelyn is at higher risk of osteopenia and osteoporosis due to vitamin D deficiency.   Pre-Diabetes Cadience has a diagnosis of prediabetes based on her elevated HgA1c and was informed this puts her at greater risk of developing diabetes. She is taking metformin currently and continues to work on diet and exercise to decrease risk of diabetes. She denies nausea, vomiting, diarrhea, or hypoglycemia. She admits to polyphagia at night.   Hyperlipidemia Ogechi has hyperlipidemia. LDL level of 114 on 01/23/18 and has been trying to improve her cholesterol levels with intensive lifestyle modification including a low saturated fat diet, exercise and weight loss. She denies any chest pain, shortness of breath, claudication or myalgias. She is not taking a statin.   ALLERGIES: Allergies  Allergen Reactions  .  Acetaminophen Itching    Patient can take tylenol, just not tylenol#3  . Hydrocodone Nausea Only  . Oxycodone Itching  . Phosphate Itching    sick  . Codeine Itching and Nausea Only    MEDICATIONS: Current Outpatient Medications on File Prior to Visit  Medication Sig Dispense Refill  . beclomethasone (QVAR REDIHALER) 80 MCG/ACT inhaler Inhale 2 puffs 2 (two) times daily into the lungs. 10.6 g 5  . budesonide-formoterol (SYMBICORT) 160-4.5 MCG/ACT inhaler Inhale 2 puffs 2 (two) times daily into the lungs. 1 Inhaler 5  . cetirizine (ZYRTEC) 10 MG tablet Take 1 tablet (10 mg total) daily by mouth. 30 tablet 5  . ESTROGEL 0.75 MG/1.25 GM (0.06%) topical gel Apply 1 application topically See admin instructions.  0  . etonogestrel-ethinyl estradiol (NUVARING) 0.12-0.015 MG/24HR vaginal ring Place 1 each vaginally every 28 (twenty-eight) days. Insert vaginally and leave in place for 3 consecutive weeks, then remove for 1 week.     Marland Kitchen ibuprofen (ADVIL,MOTRIN) 800 MG tablet Take 800 mg by mouth every 6 (six) hours as needed. for pain  3  . LINZESS 72 MCG capsule TAKE 1 CAPSULE (72 MCG TOTAL) BY MOUTH DAILY BEFORE BREAKFAST. 30 capsule 8  . metFORMIN (GLUCOPHAGE) 500 MG tablet TAKE 1 TABLET BY MOUTH EVERY DAY WITH BREAKFAST 30 tablet 0  . metoCLOPramide (REGLAN) 10 MG tablet Take 1 tablet (10 mg total) by mouth 4 (four) times daily. 120 tablet 1  . Multiple Vitamin (MULTIVITAMIN) LIQD Take 5 mLs by mouth daily.    Marland Kitchen  omeprazole (PRILOSEC) 40 MG capsule Take 1 capsule (40 mg total) by mouth daily. 30 capsule 0  . RESTASIS MULTIDOSE 0.05 % ophthalmic emulsion Place 1 drop into both eyes as needed.  3  . rizatriptan (MAXALT-MLT) 10 MG disintegrating tablet TAKE 1 TABLET (10 MG TOTAL) BY MOUTH AS NEEDED FOR MIGRAINE. MAY REPEAT IN 2 HOURS IF NEEDED 9 tablet 11  . ondansetron (ZOFRAN-ODT) 4 MG disintegrating tablet Take 1-2 tablets (4-8 mg total) by mouth 3 (three) times daily as needed for nausea or  vomiting. 16 tablet 1   No current facility-administered medications on file prior to visit.     PAST MEDICAL HISTORY: Past Medical History:  Diagnosis Date  . Allergic rhinitis   . Asthma   . Chronic migraine    Dr. Catalina Gravel  . Constipation   . Dry eye syndrome of both lacrimal glands   . Dry skin   . Endometriosis   . Fatigue   . Gastroparesis   . GERD (gastroesophageal reflux disease)   . Headache(784.0)    occasional, dx w/ Migraines before, Topamax helps  . Heartburn   . History of stomach ulcers   . Lactose intolerance   . Localized edema   . Migraine   . Muscle weakness (generalized)   . Nuclear cataract of both eyes    Mild  . Overweight   . Pain in right ankle and joints of right foot   . Shortness of breath   . Sinus complaint   . Sinusitis   . Tired   . TMJ pain dysfunction syndrome    occasional    PAST SURGICAL HISTORY: Past Surgical History:  Procedure Laterality Date  . AUGMENTATION MAMMAPLASTY Bilateral 2006  . BREAST ENHANCEMENT SURGERY  2006  . BUNIONECTOMY    . COLONOSCOPY    . endrometroisis    . fallopian tube removed     Left  . UPPER GASTROINTESTINAL ENDOSCOPY    . wisdoim teeth extraction      SOCIAL HISTORY: Social History   Tobacco Use  . Smoking status: Never Smoker  . Smokeless tobacco: Never Used  Substance Use Topics  . Alcohol use: Yes    Alcohol/week: 0.0 standard drinks    Comment: socially - occasional   . Drug use: No    FAMILY HISTORY: Family History  Problem Relation Age of Onset  . Throat cancer Mother   . Cancer - Other Mother 55       throat - died 53 months  . Cancer Mother   . Alcoholism Mother   . Heart disease Father   . Cancer Father   . Breast cancer Maternal Aunt        breast  . Colon cancer Maternal Uncle 49       died 66  . Breast cancer Maternal Aunt        breast  . Pancreatic cancer Maternal Aunt   . Diabetes Unknown        grandmother  . Hypertension Maternal Aunt        several  family members  . Throat cancer Maternal Uncle   . Asthma Unknown        cousin, maternal  . Breast cancer Maternal Aunt        total of 5 aunts  . Breast cancer Maternal Aunt   . Colon cancer Paternal Grandfather   . Heart attack Neg Hx   . Rectal cancer Neg Hx   . Stomach cancer Neg Hx  ROS: Review of Systems  Constitutional: Positive for malaise/fatigue. Negative for weight loss.  Respiratory: Negative for shortness of breath.   Cardiovascular: Negative for chest pain, palpitations and claudication.  Gastrointestinal: Negative for diarrhea, nausea and vomiting.  Musculoskeletal: Negative for myalgias.       Negative for muscle weakness  Endo/Heme/Allergies:       Negative for heat/cold intolerance  Negative for hypoglycemia Positive for polyphagia at night    PHYSICAL EXAM: Blood pressure 119/77, pulse 78, temperature 98.2 F (36.8 C), height 5\' 3"  (1.6 m), weight 198 lb (89.8 kg), SpO2 96 %. Body mass index is 35.07 kg/m. Physical Exam  Constitutional: She is oriented to person, place, and time. She appears well-developed and well-nourished.  HENT:  Head: Normocephalic.  Eyes: Pupils are equal, round, and reactive to light.  Neck: Normal range of motion.  Cardiovascular: Normal rate.  Pulmonary/Chest: Effort normal.  Musculoskeletal: Normal range of motion.  Neurological: She is alert and oriented to person, place, and time.  Skin: Skin is warm and dry.  Psychiatric: She has a normal mood and affect. Her behavior is normal.  Vitals reviewed.   RECENT LABS AND TESTS: BMET    Component Value Date/Time   NA 140 05/06/2018 1421   K 4.7 05/06/2018 1421   CL 103 05/06/2018 1421   CO2 19 (L) 05/06/2018 1421   GLUCOSE 82 05/06/2018 1421   GLUCOSE 89 02/27/2017 1552   BUN 14 05/06/2018 1421   CREATININE 0.71 05/06/2018 1421   CALCIUM 8.8 05/06/2018 1421   GFRNONAA 100 05/06/2018 1421   GFRAA 116 05/06/2018 1421   Lab Results  Component Value Date    HGBA1C 5.9 (H) 05/06/2018   HGBA1C 5.8 (H) 01/23/2018   HGBA1C 5.5 08/14/2017   HGBA1C 5.6 06/09/2015   HGBA1C 5.7 (H) 05/22/2014   Lab Results  Component Value Date   INSULIN 11.2 05/06/2018   INSULIN 8.4 01/23/2018   INSULIN 8.0 08/14/2017   CBC    Component Value Date/Time   WBC 9.4 01/23/2018 1155   WBC 9.7 03/30/2017 1512   RBC 4.21 01/23/2018 1155   RBC 4.48 03/30/2017 1512   HGB 12.2 01/23/2018 1155   HCT 37.0 01/23/2018 1155   PLT 416 (H) 03/30/2017 1512   MCV 88 01/23/2018 1155   MCH 29.0 01/23/2018 1155   MCH 28.6 03/30/2017 1512   MCHC 33.0 01/23/2018 1155   MCHC 32.5 03/30/2017 1512   RDW 15.4 01/23/2018 1155   LYMPHSABS 1.9 01/23/2018 1155   MONOABS 0.7 02/27/2017 1552   EOSABS 0.1 01/23/2018 1155   BASOSABS 0.0 01/23/2018 1155   Iron/TIBC/Ferritin/ %Sat No results found for: IRON, TIBC, FERRITIN, IRONPCTSAT Lipid Panel     Component Value Date/Time   CHOL 191 05/06/2018 1421   TRIG 109 05/06/2018 1421   HDL 65 05/06/2018 1421   CHOLHDL 3 05/14/2014 1223   VLDL 12.2 05/14/2014 1223   LDLCALC 104 (H) 05/06/2018 1421   Hepatic Function Panel     Component Value Date/Time   PROT 6.9 05/06/2018 1421   ALBUMIN 4.1 05/06/2018 1421   AST 15 05/06/2018 1421   ALT 15 05/06/2018 1421   ALKPHOS 84 05/06/2018 1421   BILITOT <0.2 05/06/2018 1421   BILIDIR 0.0 05/14/2014 1223      Component Value Date/Time   TSH 0.922 05/06/2018 1421   TSH 2.160 01/23/2018 1155   TSH 0.976 08/14/2017 1152    Ref. Range 01/23/2018 11:55  Vitamin D, 25-Hydroxy Latest Ref Range: 30.0 -  100.0 ng/mL 30.8    ASSESSMENT AND PLAN: Elevated serum free T4 level - Plan: T3, T4, free, TSH  Vitamin D deficiency - Plan: Vitamin D, Ergocalciferol, (DRISDOL) 1.25 MG (50000 UT) CAPS capsule, VITAMIN D 25 Hydroxy (Vit-D Deficiency, Fractures)  At risk for osteoporosis  Prediabetes - Plan: Hemoglobin A1c, Insulin, random  Other hyperlipidemia - Plan: Comprehensive metabolic  panel, Lipid Panel With LDL/HDL Ratio  Class 2 severe obesity with serious comorbidity and body mass index (BMI) of 35.0 to 35.9 in adult, unspecified obesity type (HCC) - Plan: Lorcaserin HCl ER (BELVIQ XR) 20 MG TB24  PLAN: Elevated T4 level We discussed elevated T4 level. We will repeat thyroid panel today. Agrees to follow up with our clinic as directed.    Vitamin D Deficiency Elzada was informed that low vitamin D levels contributes to fatigue and are associated with obesity, breast, and colon cancer. She agrees to continue to take prescription Vit D @50 ,000 IU every week #4 with no refills and will follow up for routine testing of vitamin D, at least 2-3 times per year. She was informed of the risk of over-replacement of vitamin D and agrees to not increase her dose unless she discusses this with [name] first. Agrees to follow up with our clinic as directed. We will check viamin D level today.   Pre-Diabetes Nerida will continue to work on weight loss, exercise, and decreasing simple carbohydrates in her diet to help decrease the risk of diabetes. We dicussed metformin including benefits and risks. She was informed that eating too many simple carbohydrates or too many calories at one sitting increases the likelihood of GI side effects. Aerielle agrees to continue metformin as prescribed. We will repeat HgA1c, fasting glucose, and fasting insulin today.  Lainy agreed to follow up with [name] as directed to monitor her progress.  Hyperlipidemia Dillie was informed of the American Heart Association Guidelines emphasizing intensive lifestyle modifications as the first line treatment for hyperlipidemia. We discussed many lifestyle modifications today in depth, and Rudi will continue to work on decreasing saturated fats such as fatty red meat, butter and many fried foods. She will also increase vegetables and lean protein in her diet and continue to work on exercise and weight loss efforts. We will repeat fasting  lipid today.   At risk for osteopenia and osteoporosis Kingston was given extended  (15 minutes) osteoporosis prevention counseling today. Shamiya is at risk for osteopenia and osteoporosis due to her vitamin D deficiency. She was encouraged to take her vitamin D and follow her higher calcium diet and increase strengthening exercise to help strengthen her bones and decrease her risk of osteopenia and osteoporosis.  Obesity Jacolyn is currently in the action stage of change. As such, her goal is to continue with weight loss efforts She has agreed to keep a food journal with 1400 calories and 80g of protein daily.  Lilac will continue current exercise regimen for weight loss and overall health benefits. We discussed the following Behavioral Modification Strategies today: ways to avoid night time snacking, planning for success, and better snacking choices.  Veverly agrees to start Belviq. Prescription resent to pharmacy today. We will try topiramate next if Belviq does not work.   Francely has agreed to follow up with our clinic in 3 weeks. She was informed of the importance of frequent follow up visits to maximize her success with intensive lifestyle modifications for her multiple health conditions.   OBESITY BEHAVIORAL INTERVENTION VISIT  Today's visit was #  14   Starting weight: 200 lb Starting date: 08/14/17 Today's weight : Weight: 198 lb (89.8 kg)  Today's date: 05/06/18 Total lbs lost to date: 2 lb At least 15 minutes were spent on discussing the following behavioral intervention visit.   ASK: We discussed the diagnosis of obesity with [place] today and Adelin agreed to give [name] permission to discuss obesity behavioral modification therapy today.  ASSESS: Richie has the diagnosis of obesity and her BMI today is 35.08 Kayleeann is in the action stage of change   ADVISE: Sirinity was educated on the multiple health risks of obesity as well as the benefit of weight loss to improve her health. She was  advised of the need for long term treatment and the importance of lifestyle modifications to improve her current health and to decrease her risk of future health problems.  AGREE: Multiple dietary modification options and treatment options were discussed and  Jennavecia agreed to follow the recommendations documented in the above note.  ARRANGE: Paislei was educated on the importance of frequent visits to treat obesity as outlined per CMS and USPSTF guidelines and agreed to schedule her next follow up appointment today.  I, Renee Ramus, am acting as Location manager for [employer], FNP-C.  I have reviewed the above documentation for accuracy and completeness, and I agree with the above.  - Dawn Whitmire, FNP-C.

## 2018-05-10 DIAGNOSIS — M6281 Muscle weakness (generalized): Secondary | ICD-10-CM | POA: Diagnosis not present

## 2018-05-10 DIAGNOSIS — X58XXXS Exposure to other specified factors, sequela: Secondary | ICD-10-CM | POA: Diagnosis not present

## 2018-05-10 DIAGNOSIS — Z7409 Other reduced mobility: Secondary | ICD-10-CM | POA: Diagnosis not present

## 2018-05-10 DIAGNOSIS — R269 Unspecified abnormalities of gait and mobility: Secondary | ICD-10-CM | POA: Diagnosis not present

## 2018-05-10 DIAGNOSIS — S93492S Sprain of other ligament of left ankle, sequela: Secondary | ICD-10-CM | POA: Diagnosis not present

## 2018-05-10 DIAGNOSIS — M25675 Stiffness of left foot, not elsewhere classified: Secondary | ICD-10-CM | POA: Diagnosis not present

## 2018-05-16 ENCOUNTER — Telehealth: Payer: Self-pay | Admitting: Internal Medicine

## 2018-05-16 MED ORDER — LINACLOTIDE 72 MCG PO CAPS: 72.0000 ug | ORAL_CAPSULE | Freq: Every day | ORAL | 0 refills | Status: DC

## 2018-05-16 NOTE — Telephone Encounter (Signed)
I have spoken to patient to advise that she needs appointment for further refills. She has scheduled next available appointment for 06/20/18 at 930 am. Rx sent until appointment.

## 2018-05-19 ENCOUNTER — Other Ambulatory Visit (INDEPENDENT_AMBULATORY_CARE_PROVIDER_SITE_OTHER): Payer: Self-pay | Admitting: Family Medicine

## 2018-05-19 DIAGNOSIS — R7303 Prediabetes: Secondary | ICD-10-CM

## 2018-05-21 ENCOUNTER — Encounter (INDEPENDENT_AMBULATORY_CARE_PROVIDER_SITE_OTHER): Payer: Self-pay

## 2018-05-21 ENCOUNTER — Telehealth (INDEPENDENT_AMBULATORY_CARE_PROVIDER_SITE_OTHER): Payer: Self-pay | Admitting: Family Medicine

## 2018-05-21 ENCOUNTER — Encounter (INDEPENDENT_AMBULATORY_CARE_PROVIDER_SITE_OTHER): Payer: Self-pay | Admitting: Family Medicine

## 2018-05-21 NOTE — Telephone Encounter (Signed)
Patient called asking why the RX Belviq has not been called into pharmacy as of yet.  [facility] [address]  Patient stated she was aware of the RX needing to be approved by insurance first. Please give patient a call back at [phone].

## 2018-05-21 NOTE — Telephone Encounter (Signed)
Sent the pt a mychart. Tereza Gilham, [place]

## 2018-05-21 NOTE — Telephone Encounter (Signed)
Sent the patient a my chart message. April, CMA

## 2018-05-21 NOTE — Telephone Encounter (Signed)
Patient is requesting directions for medication Melviq.  Please advise.  Thank you

## 2018-05-22 ENCOUNTER — Encounter (INDEPENDENT_AMBULATORY_CARE_PROVIDER_SITE_OTHER): Payer: Self-pay

## 2018-05-23 DIAGNOSIS — S99919A Unspecified injury of unspecified ankle, initial encounter: Secondary | ICD-10-CM | POA: Diagnosis not present

## 2018-05-23 DIAGNOSIS — S86312A Strain of muscle(s) and tendon(s) of peroneal muscle group at lower leg level, left leg, initial encounter: Secondary | ICD-10-CM | POA: Diagnosis not present

## 2018-05-24 ENCOUNTER — Ambulatory Visit
Admission: RE | Admit: 2018-05-24 | Discharge: 2018-05-24 | Disposition: A | Discharge status: Home / Self Care | Payer: BLUE CROSS/BLUE SHIELD | Source: Ambulatory Visit | Attending: Obstetrics and Gynecology | Admitting: Obstetrics and Gynecology

## 2018-05-24 DIAGNOSIS — Z1231 Encounter for screening mammogram for malignant neoplasm of breast: Secondary | ICD-10-CM

## 2018-05-27 ENCOUNTER — Ambulatory Visit (INDEPENDENT_AMBULATORY_CARE_PROVIDER_SITE_OTHER): Payer: BLUE CROSS/BLUE SHIELD | Admitting: Family Medicine

## 2018-06-02 ENCOUNTER — Other Ambulatory Visit (INDEPENDENT_AMBULATORY_CARE_PROVIDER_SITE_OTHER): Payer: Self-pay | Admitting: Family Medicine

## 2018-06-02 ENCOUNTER — Other Ambulatory Visit: Payer: Self-pay | Admitting: Internal Medicine

## 2018-06-02 DIAGNOSIS — E669 Obesity, unspecified: Secondary | ICD-10-CM

## 2018-06-02 DIAGNOSIS — Z6834 Body mass index (BMI) 34.0-34.9, adult: Principal | ICD-10-CM

## 2018-06-04 DIAGNOSIS — L700 Acne vulgaris: Secondary | ICD-10-CM | POA: Diagnosis not present

## 2018-06-04 DIAGNOSIS — L219 Seborrheic dermatitis, unspecified: Secondary | ICD-10-CM | POA: Diagnosis not present

## 2018-06-10 ENCOUNTER — Ambulatory Visit (INDEPENDENT_AMBULATORY_CARE_PROVIDER_SITE_OTHER): Payer: BLUE CROSS/BLUE SHIELD | Admitting: Family Medicine

## 2018-06-13 DIAGNOSIS — M7672 Peroneal tendinitis, left leg: Secondary | ICD-10-CM | POA: Diagnosis not present

## 2018-06-14 DIAGNOSIS — M25512 Pain in left shoulder: Secondary | ICD-10-CM | POA: Diagnosis not present

## 2018-06-14 DIAGNOSIS — M542 Cervicalgia: Secondary | ICD-10-CM | POA: Diagnosis not present

## 2018-06-17 ENCOUNTER — Ambulatory Visit (INDEPENDENT_AMBULATORY_CARE_PROVIDER_SITE_OTHER): Payer: BLUE CROSS/BLUE SHIELD | Admitting: Family Medicine

## 2018-06-17 ENCOUNTER — Encounter (INDEPENDENT_AMBULATORY_CARE_PROVIDER_SITE_OTHER): Payer: Self-pay | Admitting: Family Medicine

## 2018-06-17 VITALS — BP 117/74 | HR 91 | Temp 98.7°F | Ht 63.0 in | Wt 202.0 lb

## 2018-06-17 DIAGNOSIS — E559 Vitamin D deficiency, unspecified: Secondary | ICD-10-CM | POA: Diagnosis not present

## 2018-06-17 DIAGNOSIS — Z6835 Body mass index (BMI) 35.0-35.9, adult: Secondary | ICD-10-CM

## 2018-06-17 DIAGNOSIS — Z9189 Other specified personal risk factors, not elsewhere classified: Secondary | ICD-10-CM

## 2018-06-17 MED ORDER — VITAMIN D (ERGOCALCIFEROL) 1.25 MG (50000 UNIT) PO CAPS: 50000.0000 [IU] | ORAL_CAPSULE | ORAL | 0 refills | Status: DC

## 2018-06-18 NOTE — Progress Notes (Signed)
Office: [phone]  /  Fax: [phone]   HPI:   Chief Complaint: OBESITY Morgan Long is here to discuss her progress with her obesity treatment plan. She is on the  keep a food journal with 1400 calories and 80 grams of protein  and is following her eating plan approximately 50 % of the time. She states she is doing cardio, stretching and walking 30 minutes 3 times per week. Jonnelle gained some weight over the holidays. She started Belviq XR 2 weeks ago and feels that it has helped with appetite and cravings. Her weight is 202 lb (91.6 kg) today and 4 lbs since her last visit. She has lost 0 lbs since starting treatment with [name].  Vitamin D deficiency Morgan Long has a diagnosis of vitamin D deficiency. She is currently taking prescription Vit D, she is stable, not yet at goal but is slowly improving. She denies nausea, vomiting or muscle weakness.   At risk for osteopenia and osteoporosis Morgan Long is at higher risk of osteopenia and osteoporosis due to vitamin D deficiency.   ASSESSMENT AND PLAN:  Vitamin D deficiency - Plan: Vitamin D, Ergocalciferol, (DRISDOL) 1.25 MG (50000 UT) CAPS capsule  At risk for osteoporosis  Class 2 severe obesity with serious comorbidity and body mass index (BMI) of 35.0 to 35.9 in adult, unspecified obesity type ([place])  PLAN:  Vitamin D Deficiency Morgan Long was informed that low vitamin D levels contributes to fatigue and are associated with obesity, breast, and colon cancer. She agrees to continue to take prescription Vit D @50 ,000 IU every week #30 with no refills and will follow up for routine testing of vitamin D, at least 2-3 times per year. She was informed of the risk of over-replacement of vitamin D and agrees to not increase her dose unless she discusses this with [name] first. Morgan Long agrees to follow up with our clinic in 2 weeks.   At risk for osteopenia and osteoporosis Morgan Long was given extended  (15 minutes) osteoporosis prevention counseling today. Morgan Long is at risk for  osteopenia and osteoporosis due to her vitamin D deficiency. She was encouraged to take her vitamin D and follow her higher calcium diet and increase strengthening exercise to help strengthen her bones and decrease her risk of osteopenia and osteoporosis.  Obesity Morgan Long is currently in the action stage of change. As such, her goal is to continue with weight loss efforts She has agreed to keep a strict food journal with 1400 calories and 80 grams of protein daily. Morgan Long has been instructed to work up to a goal of 150 minutes of combined cardio and strengthening exercise per week for weight loss and overall health benefits. We discussed the following Behavioral Modification Strategies today: increasing lean protein intake, decreasing simple carbohydrates and keep a strict food journal. Morgan Long has agreed to continue taking Belviq as prescribed. Morgan Long agrees to follow up with our clinic in 2 weeks.   Morgan Long has agreed to follow up with our clinic in 2 weeks. She was informed of the importance of frequent follow up visits to maximize her success with intensive lifestyle modifications for her multiple health conditions.  ALLERGIES: Allergies  Allergen Reactions  . Acetaminophen Itching    Patient can take tylenol, just not tylenol#3  . Hydrocodone Nausea Only  . Oxycodone Itching  . Phosphate Itching    sick  . Codeine Itching and Nausea Only    MEDICATIONS: Current Outpatient Medications on File Prior to Visit  Medication Sig Dispense Refill  .  beclomethasone (QVAR REDIHALER) 80 MCG/ACT inhaler Inhale 2 puffs 2 (two) times daily into the lungs. 10.6 g 5  . budesonide-formoterol (SYMBICORT) 160-4.5 MCG/ACT inhaler Inhale 2 puffs 2 (two) times daily into the lungs. 1 Inhaler 5  . cetirizine (ZYRTEC) 10 MG tablet TAKE 1 TABLET BY MOUTH DAILY 30 tablet 5  . ESTROGEL 0.75 MG/1.25 GM (0.06%) topical gel Apply 1 application topically See admin instructions.  0  . etonogestrel-ethinyl estradiol (NUVARING)  0.12-0.015 MG/24HR vaginal ring Place 1 each vaginally every 28 (twenty-eight) days. Insert vaginally and leave in place for 3 consecutive weeks, then remove for 1 week.     Marland Kitchen ibuprofen (ADVIL,MOTRIN) 800 MG tablet Take 800 mg by mouth every 6 (six) hours as needed. for pain  3  . linaclotide (LINZESS) 72 MCG capsule Take 1 capsule (72 mcg total) by mouth daily before breakfast. 30 capsule 0  . Lorcaserin HCl ER (BELVIQ XR) 20 MG TB24 Take 1 tablet by mouth daily. 30 tablet 0  . metFORMIN (GLUCOPHAGE) 500 MG tablet TAKE 1 TABLET BY MOUTH EVERY DAY WITH BREAKFAST 30 tablet 0  . metoCLOPramide (REGLAN) 10 MG tablet TAKE 1 TABLET (10 MG TOTAL) BY MOUTH 4 (FOUR) TIMES DAILY. 120 tablet 0  . Multiple Vitamin (MULTIVITAMIN) LIQD Take 5 mLs by mouth daily.    Marland Kitchen omeprazole (PRILOSEC) 40 MG capsule Take 1 capsule (40 mg total) by mouth daily. 30 capsule 0  . ondansetron (ZOFRAN-ODT) 4 MG disintegrating tablet Take 1-2 tablets (4-8 mg total) by mouth 3 (three) times daily as needed for nausea or vomiting. 16 tablet 1  . RESTASIS MULTIDOSE 0.05 % ophthalmic emulsion Place 1 drop into both eyes as needed.  3  . rizatriptan (MAXALT-MLT) 10 MG disintegrating tablet TAKE 1 TABLET (10 MG TOTAL) BY MOUTH AS NEEDED FOR MIGRAINE. MAY REPEAT IN 2 HOURS IF NEEDED 9 tablet 11   No current facility-administered medications on file prior to visit.     PAST MEDICAL HISTORY: Past Medical History:  Diagnosis Date  . Allergic rhinitis   . Asthma   . Chronic migraine    Dr. Catalina Gravel  . Constipation   . Dry eye syndrome of both lacrimal glands   . Dry skin   . Endometriosis   . Fatigue   . Gastroparesis   . GERD (gastroesophageal reflux disease)   . Headache(784.0)    occasional, dx w/ Migraines before, Topamax helps  . Heartburn   . History of stomach ulcers   . Lactose intolerance   . Localized edema   . Migraine   . Muscle weakness (generalized)   . Nuclear cataract of both eyes    Mild  . Overweight   .  Pain in right ankle and joints of right foot   . Shortness of breath   . Sinus complaint   . Sinusitis   . Tired   . TMJ pain dysfunction syndrome    occasional    PAST SURGICAL HISTORY: Past Surgical History:  Procedure Laterality Date  . AUGMENTATION MAMMAPLASTY Bilateral 2006  . BREAST ENHANCEMENT SURGERY  2006  . BUNIONECTOMY    . COLONOSCOPY    . endrometroisis    . fallopian tube removed     Left  . UPPER GASTROINTESTINAL ENDOSCOPY    . wisdoim teeth extraction      SOCIAL HISTORY: Social History   Tobacco Use  . Smoking status: Never Smoker  . Smokeless tobacco: Never Used  Substance Use Topics  . Alcohol use: Yes  Alcohol/week: 0.0 standard drinks    Comment: socially - occasional   . Drug use: No    FAMILY HISTORY: Family History  Problem Relation Age of Onset  . Throat cancer Mother   . Cancer - Other Mother 55       throat - died 40 months  . Cancer Mother   . Alcoholism Mother   . Heart disease Father   . Cancer Father   . Breast cancer Maternal Aunt        breast  . Colon cancer Maternal Uncle 70       died 61  . Breast cancer Maternal Aunt        breast  . Pancreatic cancer Maternal Aunt   . Diabetes Other        grandmother  . Hypertension Maternal Aunt        several family members  . Throat cancer Maternal Uncle   . Asthma Other        cousin, maternal  . Breast cancer Maternal Aunt        total of 5 aunts  . Breast cancer Maternal Aunt   . Colon cancer Paternal Grandfather   . Heart attack Neg Hx   . Rectal cancer Neg Hx   . Stomach cancer Neg Hx     ROS: Review of Systems  Constitutional: Negative for weight loss.  Gastrointestinal: Negative for nausea and vomiting.  Musculoskeletal:       Negative for muscle weakness    PHYSICAL EXAM: Blood pressure 117/74, pulse 91, temperature 98.7 F (37.1 C), temperature source Oral, height 5\' 3"  (1.6 m), weight 202 lb (91.6 kg), SpO2 96 %. Body mass index is 35.78  kg/m. Physical Exam Vitals signs reviewed.  Constitutional:      Appearance: Normal appearance. She is obese.  Cardiovascular:     Rate and Rhythm: Normal rate.     Pulses: Normal pulses.  Pulmonary:     Effort: Pulmonary effort is normal.  Musculoskeletal: Normal range of motion.  Skin:    General: Skin is warm and dry.  Neurological:     Mental Status: She is alert and oriented to person, place, and time.  Psychiatric:        Mood and Affect: Mood normal.        Behavior: Behavior normal.     RECENT LABS AND TESTS: BMET    Component Value Date/Time   NA 140 05/06/2018 1421   K 4.7 05/06/2018 1421   CL 103 05/06/2018 1421   CO2 19 (L) 05/06/2018 1421   GLUCOSE 82 05/06/2018 1421   GLUCOSE 89 02/27/2017 1552   BUN 14 05/06/2018 1421   CREATININE 0.71 05/06/2018 1421   CALCIUM 8.8 05/06/2018 1421   GFRNONAA 100 05/06/2018 1421   GFRAA 116 05/06/2018 1421   Lab Results  Component Value Date   HGBA1C 5.9 (H) 05/06/2018   HGBA1C 5.8 (H) 01/23/2018   HGBA1C 5.5 08/14/2017   HGBA1C 5.6 06/09/2015   HGBA1C 5.7 (H) 05/22/2014   Lab Results  Component Value Date   INSULIN 11.2 05/06/2018   INSULIN 8.4 01/23/2018   INSULIN 8.0 08/14/2017   CBC    Component Value Date/Time   WBC 9.4 01/23/2018 1155   WBC 9.7 03/30/2017 1512   RBC 4.21 01/23/2018 1155   RBC 4.48 03/30/2017 1512   HGB 12.2 01/23/2018 1155   HCT 37.0 01/23/2018 1155   PLT 416 (H) 03/30/2017 1512   MCV 88 01/23/2018 1155  MCH 29.0 01/23/2018 1155   MCH 28.6 03/30/2017 1512   MCHC 33.0 01/23/2018 1155   MCHC 32.5 03/30/2017 1512   RDW 15.4 01/23/2018 1155   LYMPHSABS 1.9 01/23/2018 1155   MONOABS 0.7 02/27/2017 1552   EOSABS 0.1 01/23/2018 1155   BASOSABS 0.0 01/23/2018 1155   Iron/TIBC/Ferritin/ %Sat No results found for: IRON, TIBC, FERRITIN, IRONPCTSAT Lipid Panel     Component Value Date/Time   CHOL 191 05/06/2018 1421   TRIG 109 05/06/2018 1421   HDL 65 05/06/2018 1421   CHOLHDL  3 05/14/2014 1223   VLDL 12.2 05/14/2014 1223   LDLCALC 104 (H) 05/06/2018 1421   Hepatic Function Panel     Component Value Date/Time   PROT 6.9 05/06/2018 1421   ALBUMIN 4.1 05/06/2018 1421   AST 15 05/06/2018 1421   ALT 15 05/06/2018 1421   ALKPHOS 84 05/06/2018 1421   BILITOT <0.2 05/06/2018 1421   BILIDIR 0.0 05/14/2014 1223      Component Value Date/Time   TSH 0.922 05/06/2018 1421   TSH 2.160 01/23/2018 1155   TSH 0.976 08/14/2017 1152     Ref. Range 05/06/2018 14:21  Vitamin D, 25-Hydroxy Latest Ref Range: 30.0 - 100.0 ng/mL 34.0     OBESITY BEHAVIORAL INTERVENTION VISIT  Today's visit was # 15   Starting weight: 200 lbs Starting date: 08/14/2017 Today's weight :: 202 lb  Today's date: 06/17/2018 Total lbs lost to date: 0   ASK: We discussed the diagnosis of obesity with [place] today and Ashle agreed to give [name] permission to discuss obesity behavioral modification therapy today.  ASSESS: Lilyannah has the diagnosis of obesity and her BMI today is 35.79 Marguerette is in the action stage of change   ADVISE: Lucy was educated on the multiple health risks of obesity as well as the benefit of weight loss to improve her health. She was advised of the need for long term treatment and the importance of lifestyle modifications to improve her current health and to decrease her risk of future health problems.  AGREE: Multiple dietary modification options and treatment options were discussed and  Yosselyn agreed to follow the recommendations documented in the above note.  ARRANGE: Rozalyn was educated on the importance of frequent visits to treat obesity as outlined per CMS and USPSTF guidelines and agreed to schedule her next follow up appointment today.  I, Ryin Schillo, am acting as Location manager for Dennard Nip, MD  I have reviewed the above documentation for accuracy and completeness, and I agree with the above. -Dennard Nip, MD

## 2018-06-20 ENCOUNTER — Encounter: Payer: Self-pay | Admitting: Internal Medicine

## 2018-06-20 ENCOUNTER — Ambulatory Visit (INDEPENDENT_AMBULATORY_CARE_PROVIDER_SITE_OTHER): Payer: BLUE CROSS/BLUE SHIELD | Admitting: Internal Medicine

## 2018-06-20 VITALS — BP 110/66 | HR 88 | Ht 63.0 in | Wt 204.2 lb

## 2018-06-20 DIAGNOSIS — K5904 Chronic idiopathic constipation: Secondary | ICD-10-CM

## 2018-06-20 DIAGNOSIS — K3184 Gastroparesis: Secondary | ICD-10-CM | POA: Diagnosis not present

## 2018-06-20 DIAGNOSIS — K219 Gastro-esophageal reflux disease without esophagitis: Secondary | ICD-10-CM | POA: Diagnosis not present

## 2018-06-20 MED ORDER — FAMOTIDINE 20 MG PO TABS: 20.0000 mg | ORAL_TABLET | ORAL | 3 refills | Status: DC | PRN

## 2018-06-20 MED ORDER — ONDANSETRON 4 MG PO TBDP: 4.0000 mg | ORAL_TABLET | Freq: Three times a day (TID) | ORAL | 1 refills | Status: DC | PRN

## 2018-06-20 MED ORDER — LINACLOTIDE 72 MCG PO CAPS: 72.0000 ug | ORAL_CAPSULE | Freq: Every day | ORAL | 3 refills | Status: DC

## 2018-06-20 MED ORDER — LINACLOTIDE 72 MCG PO CAPS: 72.0000 ug | ORAL_CAPSULE | Freq: Every day | ORAL | 0 refills | Status: DC

## 2018-06-20 MED ORDER — AMBULATORY NON FORMULARY MEDICATION: 1 refills | Status: DC

## 2018-06-20 NOTE — Patient Instructions (Addendum)
If you are age 50 or older, your body mass index should be between 23-30. Your Body mass index is 36.17 kg/m. If this is out of the aforementioned range listed, please consider follow up with your Primary Care Provider.  If you are age 67 or younger, your body mass index should be between 19-25. Your Body mass index is 36.17 kg/m. If this is out of the aformentioned range listed, please consider follow up with your Primary Care Provider.   We have sent the following medications to your pharmacy for you to pick up at your convenience: Linzess, Pepcid and zofran  We will send in the Domperidone to the [place] pharmacy.   It was a pleasure to see you today!  Dr. Ulice Dash Pyrtle

## 2018-06-20 NOTE — Progress Notes (Signed)
Subjective:    Patient ID: Morgan Long, female    DOB: [birth year], 48 y.o.   MRN: [id number]  HPI Morgan Long is a 48 year old female with a history of GERD, gastroparesis, chronic constipation who is seen in follow-up.  She was last seen on 10/09/2016 in the office and for screening colonoscopy on 02/08/2017.  She is here alone today.  She reports she has been doing well but her gastro-paretic symptoms are definitively a problem when she does not use the domperidone.  She is use domperidone 10 mg before meals and at bedtime but has been out of this medication for some time.  Without it she has frequent upper abdominal discomfort, nausea, bloating after meals.  Early satiety.  No dysphagia or odynophagia.  Rare heartburn.  Bowel movements are better when she takes the [place].  She uses Linzess 72 to 145 mcg though not on a daily basis.  She reports she does not like to use public restrooms or restrooms at work and so she tries to schedule bowel movements while she is at home.  No blood in her stool or melena.   Review of Systems As per HPI, otherwise negative  Current Medications, Allergies, Past Medical History, Past Surgical History, Family History and Social History were reviewed in [employer] record.     Objective:   Physical Exam BP 110/66   Pulse 88   Ht 5\' 3"  (1.6 m)   Wt 204 lb 3.2 oz (92.6 kg)   BMI 36.17 kg/m  Constitutional: Well-developed and well-nourished. No distress. HEENT: Normocephalic and atraumatic.    No scleral icterus. Psychiatric: Normal mood and affect. Behavior is normal.  CBC    Component Value Date/Time   WBC 9.4 01/23/2018 1155   WBC 9.7 03/30/2017 1512   RBC 4.21 01/23/2018 1155   RBC 4.48 03/30/2017 1512   HGB 12.2 01/23/2018 1155   HCT 37.0 01/23/2018 1155   PLT 416 (H) 03/30/2017 1512   MCV 88 01/23/2018 1155   MCH 29.0 01/23/2018 1155   MCH 28.6 03/30/2017 1512   MCHC 33.0 01/23/2018 1155   MCHC 32.5 03/30/2017  1512   RDW 15.4 01/23/2018 1155   LYMPHSABS 1.9 01/23/2018 1155   MONOABS 0.7 02/27/2017 1552   EOSABS 0.1 01/23/2018 1155   BASOSABS 0.0 01/23/2018 1155   CMP     Component Value Date/Time   NA 140 05/06/2018 1421   K 4.7 05/06/2018 1421   CL 103 05/06/2018 1421   CO2 19 (L) 05/06/2018 1421   GLUCOSE 82 05/06/2018 1421   GLUCOSE 89 02/27/2017 1552   BUN 14 05/06/2018 1421   CREATININE 0.71 05/06/2018 1421   CALCIUM 8.8 05/06/2018 1421   PROT 6.9 05/06/2018 1421   ALBUMIN 4.1 05/06/2018 1421   AST 15 05/06/2018 1421   ALT 15 05/06/2018 1421   ALKPHOS 84 05/06/2018 1421   BILITOT <0.2 05/06/2018 1421   GFRNONAA 100 05/06/2018 1421   GFRAA 116 05/06/2018 1421       Assessment & Plan:  48 year old female with a history of GERD, gastroparesis, chronic constipation who is seen in follow-up.   1.  Gastroparesis --symptoms active without domperidone.  Resume domperidone 10 mg 3 times daily before meals and at bedtime.  We reviewed gastroparesis diet.  She has had prior echocardiogram and no evidence for QT prolongation.  2.  Chronic constipation/IBS constipation --would be better if she could use the Linzess on a scheduled basis.  We discussed this  today.  Resume Linzess 72 mcg daily.  Can use 2 doses if necessary which would be 145 mcg daily.  3.  GERD --periodic.  She is off of PPI.  Can use Pepcid 20 mg twice daily as needed for heartburn  4.  Colon cancer screening --normal colonoscopy in 2018, repeat 2028  25 minutes spent with the patient today. Greater than 50% was spent in counseling and coordination of care with the patient

## 2018-06-21 DIAGNOSIS — Z01419 Encounter for gynecological examination (general) (routine) without abnormal findings: Secondary | ICD-10-CM | POA: Diagnosis not present

## 2018-06-21 DIAGNOSIS — R82998 Other abnormal findings in urine: Secondary | ICD-10-CM | POA: Diagnosis not present

## 2018-06-21 DIAGNOSIS — N760 Acute vaginitis: Secondary | ICD-10-CM | POA: Diagnosis not present

## 2018-06-21 DIAGNOSIS — R351 Nocturia: Secondary | ICD-10-CM | POA: Diagnosis not present

## 2018-06-21 DIAGNOSIS — Z6834 Body mass index (BMI) 34.0-34.9, adult: Secondary | ICD-10-CM | POA: Diagnosis not present

## 2018-07-01 ENCOUNTER — Ambulatory Visit (INDEPENDENT_AMBULATORY_CARE_PROVIDER_SITE_OTHER): Payer: BLUE CROSS/BLUE SHIELD | Admitting: Family Medicine

## 2018-07-01 ENCOUNTER — Encounter (INDEPENDENT_AMBULATORY_CARE_PROVIDER_SITE_OTHER): Payer: Self-pay | Admitting: Family Medicine

## 2018-07-01 VITALS — BP 111/73 | HR 89 | Temp 98.0°F | Ht 63.0 in | Wt 196.0 lb

## 2018-07-01 DIAGNOSIS — E669 Obesity, unspecified: Secondary | ICD-10-CM | POA: Diagnosis not present

## 2018-07-01 DIAGNOSIS — R7303 Prediabetes: Secondary | ICD-10-CM

## 2018-07-01 DIAGNOSIS — E559 Vitamin D deficiency, unspecified: Secondary | ICD-10-CM

## 2018-07-01 DIAGNOSIS — Z9189 Other specified personal risk factors, not elsewhere classified: Secondary | ICD-10-CM | POA: Diagnosis not present

## 2018-07-01 DIAGNOSIS — Z6834 Body mass index (BMI) 34.0-34.9, adult: Secondary | ICD-10-CM

## 2018-07-01 MED ORDER — VITAMIN D (ERGOCALCIFEROL) 1.25 MG (50000 UNIT) PO CAPS: 50000.0000 [IU] | ORAL_CAPSULE | ORAL | 0 refills | Status: DC

## 2018-07-02 NOTE — Progress Notes (Signed)
Office: [phone]  /  Fax: [phone]   HPI:   Chief Complaint: OBESITY Morgan Long is here to discuss her progress with her obesity treatment plan. She is keeping a food journal with 1400 calories and 80+ grams of protein and is following her eating plan approximately 80 % of the time. She states she is doing stretches and cardio 30 minutes 2 to 3 times per week. Ashya started Belviq and feels that she is doing better and she has reduced evening snacking. She feels more in control and is doing better with increased lean protein. She denies nausea, vomiting, and hypoglycemia.  Her weight is 196 lb (88.9 kg) today and has had a weight loss of 6 pounds over a period of 2 weeks since her last visit. She has lost 4 lbs since starting treatment with [name].  Vitamin D deficiency Morgan Long has a diagnosis of vitamin D deficiency. She is currently taking vit D and is slowly improving, but is not yet at goal. She denies nausea, vomiting, or muscle weakness.  Pre-Diabetes Morgan Long has a diagnosis of pre-diabetes based on her elevated Hgb A1c and was informed this puts her at greater risk of developing diabetes. She is stable on metformin currently and continues to work on diet and exercise to decrease risk of diabetes. Her A1c is still 5.9 and she notes reduced polyphagia when she decreases simple carbs.  At risk for diabetes Morgan Long is at higher than average risk for developing diabetes due to her pre-diabetes and obesity. She currently denies polyuria or polydipsia.  ASSESSMENT AND PLAN:  Vitamin D deficiency - Plan: Vitamin D, Ergocalciferol, (DRISDOL) 1.25 MG (50000 UT) CAPS capsule  Prediabetes  At risk for diabetes mellitus  Class 1 obesity with serious comorbidity and body mass index (BMI) of 34.0 to 34.9 in adult, unspecified obesity type  PLAN:  Vitamin D Deficiency Morgan Long was informed that low vitamin D levels contributes to fatigue and are associated with obesity, breast, and colon cancer. She  agrees to continue to take prescription Vit D @50 ,000 IU every week #4 with no refills and will follow up for routine testing of vitamin D, at least 2-3 times per year. She was informed of the risk of over-replacement of vitamin D and agrees to not increase her dose unless she discusses this with [name] first. Morgan Long agrees to follow up in 3 weeks.  Pre-Diabetes Morgan Long will continue to work on weight loss, exercise, and decreasing simple carbohydrates in her diet to help decrease the risk of diabetes.  She was informed that eating too many simple carbohydrates or too many calories at one sitting increases the likelihood of GI side effects. Morgan Long agreed to continue her diet and metformin and a prescription was not written today. Morgan Long agreed to follow up with [name] as directed to monitor her progress.  Diabetes risk counseling Morgan Long was given extended (15 minutes) diabetes prevention counseling today. She is 48 y.o. female and has risk factors for diabetes including pre-diabetes and obesity. We discussed intensive lifestyle modifications today with an emphasis on weight loss as well as increasing exercise and decreasing simple carbohydrates in her diet.  Obesity Morgan Long is currently in the action stage of change. As such, her goal is to continue with weight loss efforts. She has agreed to keep a food journal with 1400 calories and 80+ grams of protein.  Morgan Long has been instructed to work up to a goal of 150 minutes of combined cardio and strengthening exercise per week for weight loss  and overall health benefits. We discussed the following Behavioral Modification Strategies today: increasing lean protein intake, decreasing simple carbohydrates, better snacking choices, planning for success, and work on meal planning and easy cooking plans.  Morgan Long has agreed to follow up with our clinic in 3 weeks. She was informed of the importance of frequent follow up visits to maximize her success with intensive lifestyle modifications  for her multiple health conditions.  ALLERGIES: Allergies  Allergen Reactions  . Acetaminophen Itching    Patient can take tylenol, just not tylenol#3  . Hydrocodone Nausea Only  . Oxycodone Itching  . Phosphate Itching    sick  . Codeine Itching and Nausea Only    MEDICATIONS: Current Outpatient Medications on File Prior to Visit  Medication Sig Dispense Refill  . AMBULATORY NON FORMULARY MEDICATION Medication Name: Domperidone 10 mg three times daily before meals and at bedtime 360 tablet 1  . beclomethasone (QVAR REDIHALER) 80 MCG/ACT inhaler Inhale 2 puffs 2 (two) times daily into the lungs. 10.6 g 5  . budesonide-formoterol (SYMBICORT) 160-4.5 MCG/ACT inhaler Inhale 2 puffs 2 (two) times daily into the lungs. 1 Inhaler 5  . cetirizine (ZYRTEC) 10 MG tablet TAKE 1 TABLET BY MOUTH DAILY 30 tablet 5  . ESTROGEL 0.75 MG/1.25 GM (0.06%) topical gel Apply 1 application topically See admin instructions.  0  . etonogestrel-ethinyl estradiol (NUVARING) 0.12-0.015 MG/24HR vaginal ring Place 1 each vaginally every 28 (twenty-eight) days. Insert vaginally and leave in place for 3 consecutive weeks, then remove for 1 week.     . famotidine (PEPCID) 20 MG tablet Take 1 tablet (20 mg total) by mouth as needed for heartburn or indigestion. 30 tablet 3  . ibuprofen (ADVIL,MOTRIN) 800 MG tablet Take 800 mg by mouth every 6 (six) hours as needed. for pain  3  . linaclotide (LINZESS) 72 MCG capsule Take 1 capsule (72 mcg total) by mouth daily before breakfast. 30 capsule 3  . metFORMIN (GLUCOPHAGE) 500 MG tablet TAKE 1 TABLET BY MOUTH EVERY DAY WITH BREAKFAST 30 tablet 0  . Multiple Vitamin (MULTIVITAMIN) LIQD Take 5 mLs by mouth daily.    . ondansetron (ZOFRAN-ODT) 4 MG disintegrating tablet Take 1 tablet (4 mg total) by mouth every 8 (eight) hours as needed for nausea or vomiting. Pharmacy-please d/c rx for 1-2 tabs tid 20 tablet 1  . RESTASIS MULTIDOSE 0.05 % ophthalmic emulsion Place 1 drop into  both eyes as needed.  3  . rizatriptan (MAXALT-MLT) 10 MG disintegrating tablet TAKE 1 TABLET (10 MG TOTAL) BY MOUTH AS NEEDED FOR MIGRAINE. MAY REPEAT IN 2 HOURS IF NEEDED 9 tablet 11   No current facility-administered medications on file prior to visit.     PAST MEDICAL HISTORY: Past Medical History:  Diagnosis Date  . Allergic rhinitis   . Asthma   . Chronic migraine    Dr. Catalina Gravel  . Constipation   . Dry eye syndrome of both lacrimal glands   . Dry skin   . Endometriosis   . Fatigue   . Gastroparesis   . GERD (gastroesophageal reflux disease)   . Headache(784.0)    occasional, dx w/ Migraines before, Topamax helps  . Heartburn   . History of stomach ulcers   . Lactose intolerance   . Localized edema   . Migraine   . Muscle weakness (generalized)   . Nuclear cataract of both eyes    Mild  . Overweight   . Pain in right ankle and joints of right foot   .  Shortness of breath   . Sinus complaint   . Sinusitis   . Tired   . TMJ pain dysfunction syndrome    occasional    PAST SURGICAL HISTORY: Past Surgical History:  Procedure Laterality Date  . ANKLE SURGERY Left 11/16/2017  . AUGMENTATION MAMMAPLASTY Bilateral 2006  . BREAST ENHANCEMENT SURGERY  2006  . BUNIONECTOMY    . COLONOSCOPY    . endrometroisis    . fallopian tube removed     Left  . UPPER GASTROINTESTINAL ENDOSCOPY    . wisdoim teeth extraction      SOCIAL HISTORY: Social History   Tobacco Use  . Smoking status: Never Smoker  . Smokeless tobacco: Never Used  Substance Use Topics  . Alcohol use: Yes    Alcohol/week: 0.0 standard drinks    Comment: socially - occasional   . Drug use: No    FAMILY HISTORY: Family History  Problem Relation Age of Onset  . Throat cancer Mother   . Cancer - Other Mother 55       throat - died 3 months  . Cancer Mother   . Alcoholism Mother   . Heart disease Father   . Cancer Father   . Breast cancer Maternal Aunt        breast  . Colon cancer Maternal  Uncle 81       died 64  . Breast cancer Maternal Aunt        breast  . Pancreatic cancer Maternal Aunt   . Diabetes Other        grandmother  . Hypertension Maternal Aunt        several family members  . Throat cancer Maternal Uncle   . Asthma Other        cousin, maternal  . Breast cancer Maternal Aunt        total of 5 aunts  . Breast cancer Maternal Aunt   . Colon cancer Paternal Grandfather   . Heart attack Neg Hx   . Rectal cancer Neg Hx   . Stomach cancer Neg Hx    ROS: Review of Systems  Gastrointestinal: Negative for nausea and vomiting.  Genitourinary:       Negative for polyuria.  Musculoskeletal:       Negative for muscle weakness.  Endo/Heme/Allergies: Negative for polydipsia.       Positive for polyphagia. Negative for hypoglycemia.   PHYSICAL EXAM: Blood pressure 111/73, pulse 89, temperature 98 F (36.7 C), temperature source Oral, height 5\' 3"  (1.6 m), weight 196 lb (88.9 kg), SpO2 98 %. Body mass index is 34.72 kg/m. Physical Exam Vitals signs reviewed.  Constitutional:      Appearance: Normal appearance. She is obese.  Cardiovascular:     Rate and Rhythm: Normal rate.  Pulmonary:     Effort: Pulmonary effort is normal.  Musculoskeletal: Normal range of motion.  Skin:    General: Skin is warm and dry.  Neurological:     Mental Status: She is alert and oriented to person, place, and time.  Psychiatric:        Mood and Affect: Mood normal.        Behavior: Behavior normal.     RECENT LABS AND TESTS: BMET    Component Value Date/Time   NA 140 05/06/2018 1421   K 4.7 05/06/2018 1421   CL 103 05/06/2018 1421   CO2 19 (L) 05/06/2018 1421   GLUCOSE 82 05/06/2018 1421   GLUCOSE 89 02/27/2017 1552  BUN 14 05/06/2018 1421   CREATININE 0.71 05/06/2018 1421   CALCIUM 8.8 05/06/2018 1421   GFRNONAA 100 05/06/2018 1421   GFRAA 116 05/06/2018 1421   Lab Results  Component Value Date   HGBA1C 5.9 (H) 05/06/2018   HGBA1C 5.8 (H) 01/23/2018    HGBA1C 5.5 08/14/2017   HGBA1C 5.6 06/09/2015   HGBA1C 5.7 (H) 05/22/2014   Lab Results  Component Value Date   INSULIN 11.2 05/06/2018   INSULIN 8.4 01/23/2018   INSULIN 8.0 08/14/2017   CBC    Component Value Date/Time   WBC 9.4 01/23/2018 1155   WBC 9.7 03/30/2017 1512   RBC 4.21 01/23/2018 1155   RBC 4.48 03/30/2017 1512   HGB 12.2 01/23/2018 1155   HCT 37.0 01/23/2018 1155   PLT 416 (H) 03/30/2017 1512   MCV 88 01/23/2018 1155   MCH 29.0 01/23/2018 1155   MCH 28.6 03/30/2017 1512   MCHC 33.0 01/23/2018 1155   MCHC 32.5 03/30/2017 1512   RDW 15.4 01/23/2018 1155   LYMPHSABS 1.9 01/23/2018 1155   MONOABS 0.7 02/27/2017 1552   EOSABS 0.1 01/23/2018 1155   BASOSABS 0.0 01/23/2018 1155   Iron/TIBC/Ferritin/ %Sat No results found for: IRON, TIBC, FERRITIN, IRONPCTSAT Lipid Panel     Component Value Date/Time   CHOL 191 05/06/2018 1421   TRIG 109 05/06/2018 1421   HDL 65 05/06/2018 1421   CHOLHDL 3 05/14/2014 1223   VLDL 12.2 05/14/2014 1223   LDLCALC 104 (H) 05/06/2018 1421   Hepatic Function Panel     Component Value Date/Time   PROT 6.9 05/06/2018 1421   ALBUMIN 4.1 05/06/2018 1421   AST 15 05/06/2018 1421   ALT 15 05/06/2018 1421   ALKPHOS 84 05/06/2018 1421   BILITOT <0.2 05/06/2018 1421   BILIDIR 0.0 05/14/2014 1223      Component Value Date/Time   TSH 0.922 05/06/2018 1421   TSH 2.160 01/23/2018 1155   TSH 0.976 08/14/2017 1152   Results for KEERAT, DENICOLA A (MRN [id number]) as of 07/02/2018 10:33  Ref. Range 05/06/2018 14:21  Vitamin D, 25-Hydroxy Latest Ref Range: 30.0 - 100.0 ng/mL 34.0   OBESITY BEHAVIORAL INTERVENTION VISIT  Today's visit was # 16   Starting weight: 200 lbs Starting date: 08/14/17 Today's weight : Weight: 196 lb (88.9 kg)  Today's date: 07/01/2018 Total lbs lost to date: 4  ASK: We discussed the diagnosis of obesity with [place] today and Waneta agreed to give [name] permission to discuss obesity behavioral  modification therapy today.  ASSESS: Alisia has the diagnosis of obesity and her BMI today is 34.7. Keyandra is in the action stage of change.   ADVISE: Varnika was educated on the multiple health risks of obesity as well as the benefit of weight loss to improve her health. She was advised of the need for long term treatment and the importance of lifestyle modifications to improve her current health and to decrease her risk of future health problems.  AGREE: Multiple dietary modification options and treatment options were discussed and Samanta agreed to follow the recommendations documented in the above note.  ARRANGE: Tashi was educated on the importance of frequent visits to treat obesity as outlined per CMS and USPSTF guidelines and agreed to schedule her next follow up appointment today.  I, Marcille Blanco, am acting as transcriptionist for Starlyn Skeans, MD I have reviewed the above documentation for accuracy and completeness, and I agree with the above. -Dennard Nip, MD

## 2018-07-10 ENCOUNTER — Telehealth: Payer: Self-pay | Admitting: Neurology

## 2018-07-10 NOTE — Telephone Encounter (Signed)
I called [phone] to request a refill for the patients Botox medication. I spoke with [place] at the pharmacy. It is going through insurance.   Botox authorization  [id number] (12/11/2017-12/11/2018). DW

## 2018-07-12 NOTE — Telephone Encounter (Signed)
I called to check status of the patients Botox. I spoke with Cwialine who stated it is still processing. I asked her to mark it stat.

## 2018-07-14 ENCOUNTER — Encounter (INDEPENDENT_AMBULATORY_CARE_PROVIDER_SITE_OTHER): Payer: Self-pay | Admitting: Family Medicine

## 2018-07-15 NOTE — Telephone Encounter (Signed)
I called to check status. The representative said that nothing had been done. She was going to go ahead and push it through to [facility].

## 2018-07-15 NOTE — Telephone Encounter (Signed)
Spoke with patient and she was taking 2 of the 10mg  tablets. Advised her that according to her chart the 20mg  was sent in instead of 2-10mg 

## 2018-07-15 NOTE — Telephone Encounter (Signed)
Please address

## 2018-07-15 NOTE — Telephone Encounter (Signed)
Please check into this

## 2018-07-16 NOTE — Telephone Encounter (Signed)
The medication is pending patient consent. I called the patient but she did not answer so I left a VM asking her to call back.

## 2018-07-17 ENCOUNTER — Ambulatory Visit (INDEPENDENT_AMBULATORY_CARE_PROVIDER_SITE_OTHER): Payer: BLUE CROSS/BLUE SHIELD | Admitting: Neurology

## 2018-07-17 ENCOUNTER — Telehealth: Payer: Self-pay | Admitting: Neurology

## 2018-07-17 ENCOUNTER — Encounter: Payer: Self-pay | Admitting: Neurology

## 2018-07-17 VITALS — BP 139/87 | HR 98 | Ht 63.0 in | Wt 203.0 lb

## 2018-07-17 DIAGNOSIS — G43719 Chronic migraine without aura, intractable, without status migrainosus: Secondary | ICD-10-CM

## 2018-07-17 MED ORDER — ONABOTULINUMTOXINA 100 UNITS IJ SOLR
200.0000 [IU] | Freq: Once | INTRAMUSCULAR | Status: AC
  Administered 2018-07-17: 200 [IU] via INTRAMUSCULAR

## 2018-07-17 NOTE — Patient Instructions (Signed)
Please remember, botulinum toxin takes about 3-7 days to kick in. As discussed, this is not a pain shot. The purpose of the injections is to gradually improve your symptoms. In some patients it takes up to 2-3 weeks to make a difference and it wears off with time. Sometimes it may wear off before it is time for the next injection. We still should wait till the next 3 monthly injection, because injecting too frequently may cause you to develop immunity to the botulinum toxin. We are looking for a reduction in the severity and/or frequency of your symptoms. As a reminder, side effects to look out for are (but not limited to): mouth dryness, dryness of the eyes, heaviness of your head or muscle weakness, including droopy face or droopy eyelid(s), rarely: speech or swallowing difficulties and very rarely: breathing difficulties. Some people have transient neck pain or soreness which typically responds to over-the-counter anti-inflammatory medication and local heat application with a heat pad. If you think you have a severe reaction to the botulinum toxin, such as weakness, trouble speaking, trouble breathing, or trouble swallowing, you have to call 911 or have someone take you to the nearest emergency room. However, most people have either no or minimal side effects from the injections. It is normal to have a little bit of redness and swelling around the injection sites which usually improves after a few hours. Rarely, there may be a bruise that improves on its own. Most side effects reported are very mild and resolve within 10-14 days. Please feel free to call us if you have any additional questions or concerns: [phone] or email us through My Chart. We may have to adjust the dose over time, depending on your results from this injection and your overall response over time to this medication.   

## 2018-07-17 NOTE — Telephone Encounter (Signed)
3 mo inj

## 2018-07-17 NOTE — Progress Notes (Signed)
Morgan Long is a 48 year old right-handed woman with an underlying medical history of allergic rhinitis, endometriosis, TMJ problems, and neck pain, as well as obesity, who presents for her 5th botulinum toxin injections for her chronic intractable migraine headaches. The patient is unaccompanied today. I last saw her on 04/10/2018 for her 4th injection, at which time she reported good results with her Botox injections in significant reduction in her headache frequency and severely overall. She denied any side effects. She was overdue by nearly 2 weeks for her injection at the time.  Today, 07/17/2018: She reports having done okay with last injection, botox has been helpful, no SEs. Generally speaking, she reports very good results with her Botox injections including no side effects thankfully. Her migraines have reduced by at least 7 days a month, in fact she has had much greater reduction in her migraine attacks. Her migraines are reduced by at least 100 hours a month. She has received her Botox no more often than every 12 weeks, as we are very mindful of the scheduling dates. She does report today that she recently received cosmetic Botox injections in the procerus areas. We mutually agreed to keep her Botox injections the same today.   Written informed consent for recurrent, 3 monthly intramuscular injections with botulinum toxin for this indication has previously been obtained and scanned into the patient's electronic chart. I will re-consent if the type of botulinum toxin used or the indication for injection changes for this patient in the future. The patient is informed that we will use the same consent for her recurrent, most likely 3 monthly injections. She demonstrated understanding and voiced agreement.   I have previously talked to the patient in detail about expectations, limitations, benefits as well as potential adverse effects of botulinum toxin injections. The patient understands that the  side effects include (but are not limited to): Mouth dryness, dryness of eyes, speech and swallowing difficulties, respiratory depression or problems breathing, weakness of muscles including more distant muscles than the ones injected, flu-like symptoms, myalgias, injection site reactions such as redness, itching, swelling, pain, and infection.   200 units of botulinum toxin type A were reconstituted using preservative-free normal saline to a concentration of 10 units per 0.1 mL and drawn up into 1 mL tuberculin syringes. Lot number was [id number], expiration date Aug 2022 for both 100 unit vials, [facility]    O/E: BP 139/87   Pulse 98   Ht 5' 3"  (1.6 m)   Wt 203 lb (92.1 kg)   BMI 35.96 kg/m    The patient was situated in a chair, sitting comfortably. After preparing the areas with 70% isopropyl alcohol and using a 26 gauge 1 1/2 inch hollow lumen recording EMG needle for the neck injections as well as a 30 gauge 1 inch needle for the facial injections, a total dose of 155 units of botulinum toxin type A in the form of Botox was injected into the muscles and the following distribution and quantities:   #1: 10 units on the right and 10 units in the left frontalis muscles, broken down in 2 sites on each side. #2: 5 units in the right and 5 units in the left corrugator muscles. #3: 15 units in the right and 15 units in the left occipitalis muscles, broken down in 3 sites on each side. #4: 20 units in the right and 20 units in the left temporalis muscles, broken down in 4 sites on each side.  #5: 15 units on  the right and 15 units in the left upper trapezius muscles, broken down in 3 sites on each side. #6: 10 units in the right and 10 units in the left splenius capitis muscles, broken down in 2 sites on each side.  #7: 2.5 units in the right and 2.5 units in the left procerus muscles.   EMG guidance was utilized for the neck injections with mild EMG activity noted, especially in the splenius  capitis muscles, more so on the L than R.    A dose of 45 units out of a total dose of 200 units was discarded as unavoidable waste.    The patient tolerated the procedure well without immediate complications. She was advised to make a followup appointment for repeat injections in 3 months from now and encouraged to call us with any interim questions, concerns, problems, or updates. She was in agreement and did not have any questions prior to leaving clinic today.   Previously:   I saw her on 12/26/2017 for her 3rd injection, at which time she reported having had a recent increase in her headache frequency, she had recent foot surgery.   I saw her on 09/25/2017 for her second round of injections. She felt that in the week prior to the injection her headaches were more intense.    I saw her on 06/28/2017 for her initial Botox injection, at which time she received 155 units of Botox.    She was seen by her primary care physician and received a Toradol injection on 06/19/2017 as well as a prescription for Relpax.    04/25/17: Dr. Melton Alar retired. She reports a long-standing history of migraine headaches, she recalls she was about 48 years old when she started having migraines. For quite some time she had spontaneous improvement in her migraines but some 4 or 5 years ago started having more frequent migraines. She has tried and failed multiple preventative medications but does not have a list with her and prior records from Dr. Audery Amel office are not available today. She reports that his office is closed at this point. She did not get her records from his office. Perhaps her primary care provider has records. I encouraged her to try to obtain those records as it will help [name] manage her migraines optimally. She recalls having tried Topamax. She had hair loss from some medications but is not completely sure which ones. Topamax did not help, Zonegran caused her sedation, nortriptyline was also tried but she  is unsure what happened with it. Amitriptyline she may have tried as well. She did not try Depakote. She is worried about sedation side effects as she also is going to school and has to be alert throughout the day. She takes occasional nausea medicine which likely is Zofran under the tongue. She has tried Maxalt under the tongue which is helpful at times. She needs a refill on that. She has regular eye exams because of her history of dry eyes. She used to wear a single contact but was advised to stop using it.  She has had 2 rounds of Botox injections, last time about 3 months ago as she recalls. She remembers doing okay with it. Prior to starting Botox injections she reports having about 18-20 headache days per month typically. She does not always have a typical aura but sometimes has visual blurriness and bringing in her ears to warn her about an upcoming migraine headache. She has occasional nausea that precedes the headache.   I saw  her on 06/09/2015 at which time she was referred is a new patient referral for a new problem, referred by her optometrist at the time for 1 month history of blurry vision. Her exam at the time was nonfocal and reassuringly she had no significant eye related findings. I suggested further workup in the form of visual evoked potentials, blood work, and she also reported a 6 month history of feeling tired. She had no one-sided weakness, tingling or numbness. Symptoms from the past which included paresthesias had resolved completely. She was under the care of Dr. Melton Alar for migraines. She was in the process of titrating Zonegran. She was on 225 mg each night. When she took 300 mg each night she felt too sleepy during the day. She denied any symptoms of sleep disordered breathing. She did admit not being a good sleeper. She did endorse stress as she was in school online for psychology and was also working off-and-on in Personal assistant. She reported not drinking sodas, not drinking alcohol  and she reported not smoking. She did report pain with eye movements and dry eyes.  She reported no family history of multiple sclerosis or lupus. She denied joint pain with the exception of bilateral knee pain and she also reported a 50 pound weight gain in the last year. She had visual evoked potentials on 07/06/2015: Impression: The visual evoked response test above was within normal limits bilaterally. No evidence of conduction slowing was seen within the anterior visual pathways on either side on today's evaluation.   We called her with her test results. Labs from 06/09/2015 showed normal A1c, normal vitamin D level, normal ANA, normal RF, normal ESR. CRP was elevated at 15.6. 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   She had a brain MRI with and without contrast on 06/23/2015: IMPRESSION:  This is a normal MRI of the brain with and without contrast  In addition, personally reviewed the images through the PACS system. We called her with her test results.   I first met her on 05/22/2014 at the request of her neurosurgeon, Dr. Kathyrn Sheriff, at which time the patient reported intermittent right arm numbness, particularly with neck position changes. I suggested blood work and EMG and nerve conduction testing of the right upper extremity. Her blood work showed elevated B12 and B6 levels, indicative of B vitamin supplementation. Hemoglobin A1c was 5.7. We called her with her test results. She had EMG and nerve conduction testing on 06/02/2014: IMPRESSION:   Nerve conduction studies done on both upper extremities were unremarkable, without evidence of a neuropathy seen. EMG evaluation of the right upper extremity was unremarkable, without evidence of an overlying cervical radiculopathy. We called her with her test results. At the time, she  reported improved symptoms.   05/22/2014: She has intermittent right arm numbness. Her symptoms have been going on for about 6 months. She has not noted any permanent numbness and no issues elsewhere. She does not have any significant weakness and sometimes feels weak when the numbness seems to come on. It goes away if she changes positions or adjusts her neck position. She has had right shoulder problems and pain in the right shoulder.   You saw her on 05/14/2014 for neck pain. She had undergone physical therapy without improvement of her neck pain. She had cervical epidural steroid injections which  helped for about 24 hours as understand.   She had a C-spine MRI without contrast on 01/07/2014: Mild cervical spondylosis as described above without significant disc protrusion, foraminal stenosis or central canal stenosis.    Blood work from 05/14/2014 was reviewed: She had a BMP, CBC with differential, liver function panel, lipid panel and TSH all of which were fine with the exception of a borderline LDL of 111.

## 2018-07-22 ENCOUNTER — Ambulatory Visit (INDEPENDENT_AMBULATORY_CARE_PROVIDER_SITE_OTHER): Payer: BLUE CROSS/BLUE SHIELD | Admitting: Family Medicine

## 2018-07-24 NOTE — Telephone Encounter (Signed)
I called and scheduled the patient for her Botox injection. DW  °

## 2018-07-29 DIAGNOSIS — M25572 Pain in left ankle and joints of left foot: Secondary | ICD-10-CM | POA: Diagnosis not present

## 2018-07-29 DIAGNOSIS — S99919A Unspecified injury of unspecified ankle, initial encounter: Secondary | ICD-10-CM | POA: Diagnosis not present

## 2018-08-06 ENCOUNTER — Ambulatory Visit (INDEPENDENT_AMBULATORY_CARE_PROVIDER_SITE_OTHER): Payer: BLUE CROSS/BLUE SHIELD | Admitting: Family Medicine

## 2018-08-06 ENCOUNTER — Encounter (INDEPENDENT_AMBULATORY_CARE_PROVIDER_SITE_OTHER): Payer: Self-pay | Admitting: Family Medicine

## 2018-08-06 VITALS — BP 125/77 | HR 87 | Temp 98.4°F | Ht 63.0 in | Wt 198.0 lb

## 2018-08-06 DIAGNOSIS — Z9189 Other specified personal risk factors, not elsewhere classified: Secondary | ICD-10-CM

## 2018-08-06 DIAGNOSIS — E559 Vitamin D deficiency, unspecified: Secondary | ICD-10-CM

## 2018-08-06 DIAGNOSIS — Z6835 Body mass index (BMI) 35.0-35.9, adult: Secondary | ICD-10-CM

## 2018-08-06 MED ORDER — VITAMIN D (ERGOCALCIFEROL) 1.25 MG (50000 UNIT) PO CAPS: 50000.0000 [IU] | ORAL_CAPSULE | ORAL | 0 refills | Status: DC

## 2018-08-07 NOTE — Progress Notes (Signed)
Office: [phone]  /  Fax: [phone]   HPI:   Chief Complaint: OBESITY Morgan Long is here to discuss her progress with her obesity treatment plan. She is on the keep a food journal with 1400 calories and 80+ grams of protein daily and is following her eating plan approximately 50 % of the time. She states she is stretching, walking more steps, and more mindful. [name] has struggled to get on track, and is not meeting her protein goals and calories are creeping up. She states she is ready to get back on track. Her weight is 198 lb (89.8 kg) today and has gained 2 pounds since her last visit. She has lost 2 lbs since starting treatment with Morgan Long.  Vitamin D Deficiency Morgan Long has a diagnosis of vitamin D deficiency. She is stable on prescription Vit D and denies nausea, vomiting or muscle weakness.  At risk for osteopenia and osteoporosis Morgan Long is at higher risk of osteopenia and osteoporosis due to vitamin D deficiency.   ASSESSMENT AND PLAN:  Vitamin D deficiency - Plan: Vitamin D, Ergocalciferol, (DRISDOL) 1.25 MG (50000 UT) CAPS capsule  At risk for osteoporosis  Class 2 severe obesity with serious comorbidity and body mass index (BMI) of 35.0 to 35.9 in adult, unspecified obesity type ([place])  PLAN:  Vitamin D Deficiency Morgan Long was informed that low vitamin D levels contributes to fatigue and are associated with obesity, breast, and colon cancer. Morgan Long agrees to continue taking prescription Vit D @50 ,000 IU every week #4 and we will refill for 1 month. She will follow up for routine testing of vitamin D, at least 2-3 times per year. She was informed of the risk of over-replacement of vitamin D and agrees to not increase her dose unless she discusses this with Morgan Long first. Morgan Long agrees to follow up with our clinic in 3 to 4 weeks.  At risk for osteopenia and osteoporosis Morgan Long was given extended (15 minutes) osteoporosis prevention counseling today. Morgan Long is at risk for osteopenia and osteoporsis  due to her vitamin D deficiency. She was encouraged to take her vitamin D and follow her higher calcium diet and increase strengthening exercise to help strengthen her bones and decrease her risk of osteopenia and osteoporosis.  Obesity Morgan Long is currently in the action stage of change. As such, her goal is to continue with weight loss efforts She has agreed to keep a food journal with 1400 calories and 80+ grams of protein daily Morgan Long has been instructed to work up to a goal of 150 minutes of combined cardio and strengthening exercise per week for weight loss and overall health benefits. We discussed the following Behavioral Modification Strategies today: increasing lean protein intake, decreasing simple carbohydrates, work on meal planning and easy cooking plans, and keeping healthy foods in the home   Morgan Long has agreed to follow up with our clinic in 3 to 4 weeks. She was informed of the importance of frequent follow up visits to maximize her success with intensive lifestyle modifications for her multiple health conditions.  ALLERGIES: Allergies  Allergen Reactions  . Acetaminophen Itching    Patient can take tylenol, just not tylenol#3  . Hydrocodone Nausea Only  . Oxycodone Itching  . Phosphate Itching    sick  . Codeine Itching and Nausea Only    MEDICATIONS: Current Outpatient Medications on File Prior to Visit  Medication Sig Dispense Refill  . AMBULATORY NON FORMULARY MEDICATION Medication Name: Domperidone 10 mg three times daily before meals and at bedtime  360 tablet 1  . beclomethasone (QVAR REDIHALER) 80 MCG/ACT inhaler Inhale 2 puffs 2 (two) times daily into the lungs. 10.6 g 5  . budesonide-formoterol (SYMBICORT) 160-4.5 MCG/ACT inhaler Inhale 2 puffs 2 (two) times daily into the lungs. 1 Inhaler 5  . cetirizine (ZYRTEC) 10 MG tablet TAKE 1 TABLET BY MOUTH DAILY 30 tablet 5  . ESTROGEL 0.75 MG/1.25 GM (0.06%) topical gel Apply 1 application topically See admin instructions.   0  . etonogestrel-ethinyl estradiol (NUVARING) 0.12-0.015 MG/24HR vaginal ring Place 1 each vaginally every 28 (twenty-eight) days. Insert vaginally and leave in place for 3 consecutive weeks, then remove for 1 week.     . famotidine (PEPCID) 20 MG tablet Take 1 tablet (20 mg total) by mouth as needed for heartburn or indigestion. 30 tablet 3  . ibuprofen (ADVIL,MOTRIN) 800 MG tablet Take 800 mg by mouth every 6 (six) hours as needed. for pain  3  . linaclotide (LINZESS) 72 MCG capsule Take 1 capsule (72 mcg total) by mouth daily before breakfast. 30 capsule 3  . metFORMIN (GLUCOPHAGE) 500 MG tablet TAKE 1 TABLET BY MOUTH EVERY DAY WITH BREAKFAST 30 tablet 0  . Multiple Vitamin (MULTIVITAMIN) LIQD Take 5 mLs by mouth daily.    . ondansetron (ZOFRAN-ODT) 4 MG disintegrating tablet Take 1 tablet (4 mg total) by mouth every 8 (eight) hours as needed for nausea or vomiting. Pharmacy-please d/c rx for 1-2 tabs tid 20 tablet 1  . RESTASIS MULTIDOSE 0.05 % ophthalmic emulsion Place 1 drop into both eyes as needed.  3  . rizatriptan (MAXALT-MLT) 10 MG disintegrating tablet TAKE 1 TABLET (10 MG TOTAL) BY MOUTH AS NEEDED FOR MIGRAINE. MAY REPEAT IN 2 HOURS IF NEEDED 9 tablet 11   No current facility-administered medications on file prior to visit.     PAST MEDICAL HISTORY: Past Medical History:  Diagnosis Date  . Allergic rhinitis   . Asthma   . Chronic migraine    Dr. Catalina Gravel  . Constipation   . Dry eye syndrome of both lacrimal glands   . Dry skin   . Endometriosis   . Fatigue   . Gastroparesis   . GERD (gastroesophageal reflux disease)   . Headache(784.0)    occasional, dx w/ Migraines before, Topamax helps  . Heartburn   . History of stomach ulcers   . Lactose intolerance   . Localized edema   . Migraine   . Muscle weakness (generalized)   . Nuclear cataract of both eyes    Mild  . Overweight   . Pain in right ankle and joints of right foot   . Shortness of breath   . Sinus complaint    . Sinusitis   . Tired   . TMJ pain dysfunction syndrome    occasional    PAST SURGICAL HISTORY: Past Surgical History:  Procedure Laterality Date  . ANKLE SURGERY Left 11/16/2017  . AUGMENTATION MAMMAPLASTY Bilateral 2006  . BREAST ENHANCEMENT SURGERY  2006  . BUNIONECTOMY    . COLONOSCOPY    . endrometroisis    . fallopian tube removed     Left  . UPPER GASTROINTESTINAL ENDOSCOPY    . wisdoim teeth extraction      SOCIAL HISTORY: Social History   Tobacco Use  . Smoking status: Never Smoker  . Smokeless tobacco: Never Used  Substance Use Topics  . Alcohol use: Yes    Alcohol/week: 0.0 standard drinks    Comment: socially - occasional   . Drug  use: No    FAMILY HISTORY: Family History  Problem Relation Age of Onset  . Throat cancer Mother   . Cancer - Other Mother 55       throat - died 30 months  . Cancer Mother   . Alcoholism Mother   . Heart disease Father   . Cancer Father   . Breast cancer Maternal Aunt        breast  . Colon cancer Maternal Uncle 23       died 63  . Breast cancer Maternal Aunt        breast  . Pancreatic cancer Maternal Aunt   . Diabetes Other        grandmother  . Hypertension Maternal Aunt        several family members  . Throat cancer Maternal Uncle   . Asthma Other        cousin, maternal  . Breast cancer Maternal Aunt        total of 5 aunts  . Breast cancer Maternal Aunt   . Colon cancer Paternal Grandfather   . Heart attack Neg Hx   . Rectal cancer Neg Hx   . Stomach cancer Neg Hx     ROS: Review of Systems  Constitutional: Negative for weight loss.  Gastrointestinal: Negative for nausea and vomiting.  Musculoskeletal:       Negative muscle weakness    PHYSICAL EXAM: Blood pressure 125/77, pulse 87, temperature 98.4 F (36.9 C), temperature source Oral, height 5\' 3"  (1.6 m), weight 198 lb (89.8 kg), SpO2 97 %. Body mass index is 35.07 kg/m. Physical Exam Vitals signs reviewed.  Constitutional:       Appearance: Normal appearance. She is obese.  Cardiovascular:     Rate and Rhythm: Normal rate.     Pulses: Normal pulses.  Pulmonary:     Effort: Pulmonary effort is normal.     Breath sounds: Normal breath sounds.  Musculoskeletal: Normal range of motion.  Skin:    General: Skin is warm and dry.  Neurological:     Mental Status: She is alert and oriented to person, place, and time.  Psychiatric:        Mood and Affect: Mood normal.        Behavior: Behavior normal.     RECENT LABS AND TESTS: BMET    Component Value Date/Time   NA 140 05/06/2018 1421   K 4.7 05/06/2018 1421   CL 103 05/06/2018 1421   CO2 19 (L) 05/06/2018 1421   GLUCOSE 82 05/06/2018 1421   GLUCOSE 89 02/27/2017 1552   BUN 14 05/06/2018 1421   CREATININE 0.71 05/06/2018 1421   CALCIUM 8.8 05/06/2018 1421   GFRNONAA 100 05/06/2018 1421   GFRAA 116 05/06/2018 1421   Lab Results  Component Value Date   HGBA1C 5.9 (H) 05/06/2018   HGBA1C 5.8 (H) 01/23/2018   HGBA1C 5.5 08/14/2017   HGBA1C 5.6 06/09/2015   HGBA1C 5.7 (H) 05/22/2014   Lab Results  Component Value Date   INSULIN 11.2 05/06/2018   INSULIN 8.4 01/23/2018   INSULIN 8.0 08/14/2017   CBC    Component Value Date/Time   WBC 9.4 01/23/2018 1155   WBC 9.7 03/30/2017 1512   RBC 4.21 01/23/2018 1155   RBC 4.48 03/30/2017 1512   HGB 12.2 01/23/2018 1155   HCT 37.0 01/23/2018 1155   PLT 416 (H) 03/30/2017 1512   MCV 88 01/23/2018 1155   MCH 29.0 01/23/2018 1155  MCH 28.6 03/30/2017 1512   MCHC 33.0 01/23/2018 1155   MCHC 32.5 03/30/2017 1512   RDW 15.4 01/23/2018 1155   LYMPHSABS 1.9 01/23/2018 1155   MONOABS 0.7 02/27/2017 1552   EOSABS 0.1 01/23/2018 1155   BASOSABS 0.0 01/23/2018 1155   Iron/TIBC/Ferritin/ %Sat No results found for: IRON, TIBC, FERRITIN, IRONPCTSAT Lipid Panel     Component Value Date/Time   CHOL 191 05/06/2018 1421   TRIG 109 05/06/2018 1421   HDL 65 05/06/2018 1421   CHOLHDL 3 05/14/2014 1223   VLDL  12.2 05/14/2014 1223   LDLCALC 104 (H) 05/06/2018 1421   Hepatic Function Panel     Component Value Date/Time   PROT 6.9 05/06/2018 1421   ALBUMIN 4.1 05/06/2018 1421   AST 15 05/06/2018 1421   ALT 15 05/06/2018 1421   ALKPHOS 84 05/06/2018 1421   BILITOT <0.2 05/06/2018 1421   BILIDIR 0.0 05/14/2014 1223      Component Value Date/Time   TSH 0.922 05/06/2018 1421   TSH 2.160 01/23/2018 1155   TSH 0.976 08/14/2017 1152      OBESITY BEHAVIORAL INTERVENTION VISIT  Today's visit was # 17   Starting weight: 200 lbs Starting date: 08/14/17 Today's weight : 198 lbs Today's date: 08/06/2018 Total lbs lost to date: 2    08/06/2018  Height 5\' 3"  (1.6 m)  Weight 198 lb (89.8 kg)  BMI (Calculated) 35.08  BLOOD PRESSURE - SYSTOLIC 546  BLOOD PRESSURE - DIASTOLIC 77   Body Fat % 27.0 %  Total Body Water (lbs) 77.2 lbs     ASK: We discussed the diagnosis of obesity with Gerica A Schloemer today and Eryanna agreed to give Morgan Long permission to discuss obesity behavioral modification therapy today.  ASSESS: Morgan Long has the diagnosis of obesity and her BMI today is 35.08 Morgan Long is in the action stage of change   ADVISE: Denasia was educated on the multiple health risks of obesity as well as the benefit of weight loss to improve her health. She was advised of the need for long term treatment and the importance of lifestyle modifications to improve her current health and to decrease her risk of future health problems.  AGREE: Multiple dietary modification options and treatment options were discussed and  Morgan Long agreed to follow the recommendations documented in the above note.  ARRANGE: Morgan Long was educated on the importance of frequent visits to treat obesity as outlined per CMS and USPSTF guidelines and agreed to schedule her next follow up appointment today.  I, Trixie Dredge, am acting as transcriptionist for Dennard Nip, MD  I have reviewed the above documentation for accuracy and completeness,  and I agree with the above. -Dennard Nip, MD

## 2018-08-23 DIAGNOSIS — L700 Acne vulgaris: Secondary | ICD-10-CM | POA: Diagnosis not present

## 2018-08-23 NOTE — Telephone Encounter (Signed)
Would recommend a low gas diet Would ensure that she is continuing domperidone 10 mg 3 times daily before meals and at bedtime and having regular bowel movements using her prescribed laxatives Can use over-the-counter Gas-X and or Beano to see if this helps Smaller more frequent meals should help as well

## 2018-08-29 ENCOUNTER — Ambulatory Visit (INDEPENDENT_AMBULATORY_CARE_PROVIDER_SITE_OTHER): Payer: BLUE CROSS/BLUE SHIELD | Admitting: Family Medicine

## 2018-08-29 ENCOUNTER — Encounter (INDEPENDENT_AMBULATORY_CARE_PROVIDER_SITE_OTHER): Payer: Self-pay | Admitting: Family Medicine

## 2018-08-29 ENCOUNTER — Other Ambulatory Visit: Payer: Self-pay

## 2018-08-29 ENCOUNTER — Encounter (INDEPENDENT_AMBULATORY_CARE_PROVIDER_SITE_OTHER): Payer: Self-pay

## 2018-08-29 DIAGNOSIS — R7303 Prediabetes: Secondary | ICD-10-CM

## 2018-08-29 DIAGNOSIS — Z6835 Body mass index (BMI) 35.0-35.9, adult: Secondary | ICD-10-CM | POA: Diagnosis not present

## 2018-08-29 DIAGNOSIS — E559 Vitamin D deficiency, unspecified: Secondary | ICD-10-CM

## 2018-08-29 MED ORDER — METFORMIN HCL 500 MG PO TABS: 500.0000 mg | ORAL_TABLET | Freq: Every day | ORAL | 0 refills | Status: DC

## 2018-08-29 MED ORDER — VITAMIN D (ERGOCALCIFEROL) 1.25 MG (50000 UNIT) PO CAPS: 50000.0000 [IU] | ORAL_CAPSULE | ORAL | 0 refills | Status: DC

## 2018-09-02 NOTE — Progress Notes (Signed)
Office: [phone]  /  Fax: [phone] TeleHealth Visit:  Morgan Long has consented to this TeleHealth visit today via Skype. The patient is located at home, the provider is located at the [facility] office. The participants in this visit include the listed provider and patient.  HPI:   Chief Complaint: OBESITY Morgan Long is here to discuss her progress with her obesity treatment plan. She is keeping a food journal with 1400 calories and 80 grams of protein and is following her eating plan approximately 70 % of the time. She states she is walking 30 minutes 3 times per week. 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 She is doing well finding groceries easily and meeting her calorie and protein goals well. She is with family and is not struggling with anxiety.  We were unable to weigh the patient today for this TeleHealth visit. She feels as if she has lost weight since her last visit. She has lost 2 lbs since starting treatment with [name].  Pre-Diabetes Morgan Long has a diagnosis of pre-diabetes based on her elevated Hgb A1c and was informed this puts her at greater risk of developing diabetes. She is stable on metformin currently and is doing well on her diet prescription. She notes reduced polyphagia and denies hypoglycemia. She has been exercising regularly and feels well overall.    Vitamin D Deficiency Morgan Long has a diagnosis of vitamin D deficiency. She is currently stable on vit D, but is not yet at goal. Morgan Long denies nausea, vomiting, or muscle weakness.  ASSESSMENT AND PLAN:  Prediabetes - Plan: metFORMIN (GLUCOPHAGE) 500 MG tablet  Vitamin D deficiency - Plan: Vitamin D, Ergocalciferol, (DRISDOL) 1.25 MG (50000 UT) CAPS capsule  Class 2 severe obesity with serious comorbidity and body mass index (BMI) of 35.0 to 35.9 in adult, unspecified obesity type ([place])  PLAN:  Pre-Diabetes Shaquira will continue to work on weight  loss, exercise, and decreasing simple carbohydrates in her diet to help decrease the risk of diabetes. She was informed that eating too many simple carbohydrates or too many calories at one sitting increases the likelihood of GI side effects. Spirit agreed to continue metformin 500 mg qAM #30 with no refills and a prescription was written today. Tyreonna agreed to follow up with [name] as directed to monitor her progress in 3 weeks.  Vitamin D Deficiency Morgan Long was informed that low vitamin D levels contribute to fatigue and are associated with obesity, breast, and colon cancer. Morgan Long agrees to continue to take prescription Vit D @50 ,000 IU every week #4 with no refills and will follow up for routine testing of vitamin D, at least 2-3 times per year. She was informed of the risk of over-replacement of vitamin D and agrees to not increase her dose unless she discusses this with [name] first. Devlynn agrees to follow up in 3 weeks as directed.  Obesity Morgan Long is currently in the action stage of change. As such, her goal is to continue with weight loss efforts. She has agreed to keep a food journal with 1400 calories and 80+ grams of protein.  Morgan Long has been instructed to work up to a goal of 150 minutes of combined cardio and strengthening exercise per week for weight loss and overall health benefits. We discussed the following Behavioral Modification Strategies today: increasing lean protein intake, decreasing simple carbohydrates, work on meal planning and easy cooking plans, emotional eating strategies, ways to avoid  boredom eating, keeping healthy foods in the home, better snacking choices, and ways to avoid night time snacking.  Morgan Long has agreed to follow up with our clinic in 3 weeks. She was informed of the importance of frequent follow up visits to maximize her success with intensive lifestyle modifications for her multiple health conditions.  ALLERGIES: Allergies  Allergen Reactions  . Acetaminophen Itching     Patient can take tylenol, just not tylenol#3  . Hydrocodone Nausea Only  . Oxycodone Itching  . Phosphate Itching    sick  . Codeine Itching and Nausea Only    MEDICATIONS: Current Outpatient Medications on File Prior to Visit  Medication Sig Dispense Refill  . beclomethasone (QVAR REDIHALER) 80 MCG/ACT inhaler Inhale 2 puffs 2 (two) times daily into the lungs. 10.6 g 5  . budesonide-formoterol (SYMBICORT) 160-4.5 MCG/ACT inhaler Inhale 2 puffs 2 (two) times daily into the lungs. 1 Inhaler 5  . cetirizine (ZYRTEC) 10 MG tablet TAKE 1 TABLET BY MOUTH DAILY 30 tablet 5  . ESTROGEL 0.75 MG/1.25 GM (0.06%) topical gel Apply 1 application topically See admin instructions.  0  . etonogestrel-ethinyl estradiol (NUVARING) 0.12-0.015 MG/24HR vaginal ring Place 1 each vaginally every 28 (twenty-eight) days. Insert vaginally and leave in place for 3 consecutive weeks, then remove for 1 week.     . famotidine (PEPCID) 20 MG tablet Take 1 tablet (20 mg total) by mouth as needed for heartburn or indigestion. 30 tablet 3  . ibuprofen (ADVIL,MOTRIN) 800 MG tablet Take 800 mg by mouth every 6 (six) hours as needed. for pain  3  . linaclotide (LINZESS) 72 MCG capsule Take 1 capsule (72 mcg total) by mouth daily before breakfast. 30 capsule 3  . Multiple Vitamin (MULTIVITAMIN) LIQD Take 5 mLs by mouth daily.    . ondansetron (ZOFRAN-ODT) 4 MG disintegrating tablet Take 1 tablet (4 mg total) by mouth every 8 (eight) hours as needed for nausea or vomiting. Pharmacy-please d/c rx for 1-2 tabs tid 20 tablet 1  . RESTASIS MULTIDOSE 0.05 % ophthalmic emulsion Place 1 drop into both eyes as needed.  3  . rizatriptan (MAXALT-MLT) 10 MG disintegrating tablet TAKE 1 TABLET (10 MG TOTAL) BY MOUTH AS NEEDED FOR MIGRAINE. MAY REPEAT IN 2 HOURS IF NEEDED 9 tablet 11   No current facility-administered medications on file prior to visit.     PAST MEDICAL HISTORY: Past Medical History:  Diagnosis Date  . Allergic rhinitis    . Asthma   . Chronic migraine    Dr. Catalina Gravel  . Constipation   . Dry eye syndrome of both lacrimal glands   . Dry skin   . Endometriosis   . Fatigue   . Gastroparesis   . GERD (gastroesophageal reflux disease)   . Headache(784.0)    occasional, dx w/ Migraines before, Topamax helps  . Heartburn   . History of stomach ulcers   . Lactose intolerance   . Localized edema   . Migraine   . Muscle weakness (generalized)   . Nuclear cataract of both eyes    Mild  . Overweight   . Pain in right ankle and joints of right foot   . Shortness of breath   . Sinus complaint   . Sinusitis   . Tired   . TMJ pain dysfunction syndrome    occasional    PAST SURGICAL HISTORY: Past Surgical History:  Procedure Laterality Date  . ANKLE SURGERY Left 11/16/2017  . AUGMENTATION MAMMAPLASTY Bilateral 2006  .  BREAST ENHANCEMENT SURGERY  2006  . BUNIONECTOMY    . COLONOSCOPY    . endrometroisis    . fallopian tube removed     Left  . UPPER GASTROINTESTINAL ENDOSCOPY    . wisdoim teeth extraction      SOCIAL HISTORY: Social History   Tobacco Use  . Smoking status: Never Smoker  . Smokeless tobacco: Never Used  Substance Use Topics  . Alcohol use: Yes    Alcohol/week: 0.0 standard drinks    Comment: socially - occasional   . Drug use: No    FAMILY HISTORY: Family History  Problem Relation Age of Onset  . Throat cancer Mother   . Cancer - Other Mother 55       throat - died 61 months  . Cancer Mother   . Alcoholism Mother   . Heart disease Father   . Cancer Father   . Breast cancer Maternal Aunt        breast  . Colon cancer Maternal Uncle 44       died 28  . Breast cancer Maternal Aunt        breast  . Pancreatic cancer Maternal Aunt   . Diabetes Other        grandmother  . Hypertension Maternal Aunt        several family members  . Throat cancer Maternal Uncle   . Asthma Other        cousin, maternal  . Breast cancer Maternal Aunt        total of 5 aunts  .  Breast cancer Maternal Aunt   . Colon cancer Paternal Grandfather   . Heart attack Neg Hx   . Rectal cancer Neg Hx   . Stomach cancer Neg Hx     ROS: Review of Systems  Gastrointestinal: Negative for nausea and vomiting.  Musculoskeletal:       Negative for muscle weakness.  Endo/Heme/Allergies:       Positive for polyphagia. Negative for hypoglycemia.    PHYSICAL EXAM: Pt in no acute distress  RECENT LABS AND TESTS: BMET    Component Value Date/Time   NA 140 05/06/2018 1421   K 4.7 05/06/2018 1421   CL 103 05/06/2018 1421   CO2 19 (L) 05/06/2018 1421   GLUCOSE 82 05/06/2018 1421   GLUCOSE 89 02/27/2017 1552   BUN 14 05/06/2018 1421   CREATININE 0.71 05/06/2018 1421   CALCIUM 8.8 05/06/2018 1421   GFRNONAA 100 05/06/2018 1421   GFRAA 116 05/06/2018 1421   Lab Results  Component Value Date   HGBA1C 5.9 (H) 05/06/2018   HGBA1C 5.8 (H) 01/23/2018   HGBA1C 5.5 08/14/2017   HGBA1C 5.6 06/09/2015   HGBA1C 5.7 (H) 05/22/2014   Lab Results  Component Value Date   INSULIN 11.2 05/06/2018   INSULIN 8.4 01/23/2018   INSULIN 8.0 08/14/2017   CBC    Component Value Date/Time   WBC 9.4 01/23/2018 1155   WBC 9.7 03/30/2017 1512   RBC 4.21 01/23/2018 1155   RBC 4.48 03/30/2017 1512   HGB 12.2 01/23/2018 1155   HCT 37.0 01/23/2018 1155   PLT 416 (H) 03/30/2017 1512   MCV 88 01/23/2018 1155   MCH 29.0 01/23/2018 1155   MCH 28.6 03/30/2017 1512   MCHC 33.0 01/23/2018 1155   MCHC 32.5 03/30/2017 1512   RDW 15.4 01/23/2018 1155   LYMPHSABS 1.9 01/23/2018 1155   MONOABS 0.7 02/27/2017 1552   EOSABS 0.1 01/23/2018 1155  BASOSABS 0.0 01/23/2018 1155   Iron/TIBC/Ferritin/ %Sat No results found for: IRON, TIBC, FERRITIN, IRONPCTSAT Lipid Panel     Component Value Date/Time   CHOL 191 05/06/2018 1421   TRIG 109 05/06/2018 1421   HDL 65 05/06/2018 1421   CHOLHDL 3 05/14/2014 1223   VLDL 12.2 05/14/2014 1223   LDLCALC 104 (H) 05/06/2018 1421   Hepatic Function  Panel     Component Value Date/Time   PROT 6.9 05/06/2018 1421   ALBUMIN 4.1 05/06/2018 1421   AST 15 05/06/2018 1421   ALT 15 05/06/2018 1421   ALKPHOS 84 05/06/2018 1421   BILITOT <0.2 05/06/2018 1421   BILIDIR 0.0 05/14/2014 1223      Component Value Date/Time   TSH 0.922 05/06/2018 1421   TSH 2.160 01/23/2018 1155   TSH 0.976 08/14/2017 1152   Results for CASSONDRA, STACHOWSKI A (MRN [id number]) as of 09/02/2018 10:31  Ref. Range 05/06/2018 14:21  Vitamin D, 25-Hydroxy Latest Ref Range: 30.0 - 100.0 ng/mL 34.0    I, Marcille Blanco, CMA, am acting as transcriptionist for Starlyn Skeans, MD I have reviewed the above documentation for accuracy and completeness, and I agree with the above. -Dennard Nip, MD

## 2018-09-05 ENCOUNTER — Encounter (INDEPENDENT_AMBULATORY_CARE_PROVIDER_SITE_OTHER): Payer: Self-pay | Admitting: Family Medicine

## 2018-09-09 ENCOUNTER — Telehealth: Payer: Self-pay | Admitting: Internal Medicine

## 2018-09-09 MED ORDER — AMBULATORY NON FORMULARY MEDICATION: 1 refills | Status: DC

## 2018-09-09 NOTE — Telephone Encounter (Signed)
Pt would like to know how she is supposed to take domperidone.  Please call her back to advise.

## 2018-09-09 NOTE — Telephone Encounter (Signed)
Patient wanted to know how to take Domperidone because she thought it was only three times a day and if it is four times a day (with meals and at bedtime) she will not have enough pills. Updated Rx sent to pharmacy

## 2018-09-09 NOTE — Telephone Encounter (Signed)
Please advise 

## 2018-09-12 ENCOUNTER — Other Ambulatory Visit: Payer: Self-pay | Admitting: Emergency Medicine

## 2018-09-12 MED ORDER — AMBULATORY NON FORMULARY MEDICATION: 1 refills | Status: DC

## 2018-09-12 NOTE — Telephone Encounter (Signed)
Spoke with [place] [facility] pharmacy they stated they can not give "inbetween" amounts of medication so we would need to round the prescription up or down to the next hundred. Rx quantity changed to 800 per pharmacist recommendation. Sent in new Rx. Left message on patients voicemail to inform her of the change.

## 2018-09-17 ENCOUNTER — Other Ambulatory Visit: Payer: Self-pay

## 2018-09-17 ENCOUNTER — Other Ambulatory Visit (INDEPENDENT_AMBULATORY_CARE_PROVIDER_SITE_OTHER): Payer: Self-pay | Admitting: Family Medicine

## 2018-09-17 ENCOUNTER — Ambulatory Visit (INDEPENDENT_AMBULATORY_CARE_PROVIDER_SITE_OTHER): Payer: BLUE CROSS/BLUE SHIELD | Admitting: Family Medicine

## 2018-09-17 ENCOUNTER — Encounter (INDEPENDENT_AMBULATORY_CARE_PROVIDER_SITE_OTHER): Payer: Self-pay | Admitting: Family Medicine

## 2018-09-17 DIAGNOSIS — E559 Vitamin D deficiency, unspecified: Secondary | ICD-10-CM | POA: Diagnosis not present

## 2018-09-17 DIAGNOSIS — Z6835 Body mass index (BMI) 35.0-35.9, adult: Secondary | ICD-10-CM | POA: Diagnosis not present

## 2018-09-17 MED ORDER — LIRAGLUTIDE -WEIGHT MANAGEMENT 18 MG/3ML ~~LOC~~ SOPN: 3.0000 mg | PEN_INJECTOR | Freq: Every day | SUBCUTANEOUS | 0 refills | Status: DC

## 2018-09-17 MED ORDER — INSULIN PEN NEEDLE 32G X 4 MM MISC: 1.0000 | Freq: Two times a day (BID) | 0 refills | Status: DC

## 2018-09-17 MED ORDER — VITAMIN D (ERGOCALCIFEROL) 1.25 MG (50000 UNIT) PO CAPS: 50000.0000 [IU] | ORAL_CAPSULE | ORAL | 0 refills | Status: DC

## 2018-09-17 NOTE — Progress Notes (Signed)
Office: [phone]  /  Fax: [phone] TeleHealth Visit:  Morgan Long has verbally consented to this TeleHealth visit today. The patient is located at home, the provider is located at the [facility] office. The participants in this visit include the listed provider and patient. Tory was unable to use Skype today and the Telehealth visit was conducted via telephone.  HPI:   Chief Complaint: OBESITY Morgan Long is here to discuss her progress with her obesity treatment plan. She is keeping a food journal with 1400 calories and 80 grams of protein and is following her eating plan approximately 60 % of the time. She states she is exercising 0 minutes 0 times per week. Morgan Long is working on diet and exercise. She had to stop Belviq as it was taken off the market. She had tried Contrave in the past and had GI upset. Phentermine is not appropriate with her headache.  We were unable to weigh the patient today for this TeleHealth visit. She feels as if she has maintained weight since her last visit. She has lost 2 lbs since starting treatment with [name].  Vitamin D Deficiency Morgan Long has a diagnosis of vitamin D deficiency. She is currently stable on vit D, but is not yet at goal. Miquela denies nausea, vomiting, or muscle weakness.  ASSESSMENT AND PLAN:  Vitamin D deficiency - Plan: Vitamin D, Ergocalciferol, (DRISDOL) 1.25 MG (50000 UT) CAPS capsule  Class 2 severe obesity with serious comorbidity and body mass index (BMI) of 35.0 to 35.9 in adult, unspecified obesity type ([place]) - Plan: Liraglutide -Weight Management (SAXENDA) 18 MG/3ML SOPN, Insulin Pen Needle (BD PEN NEEDLE NANO 2ND GEN) 32G X 4 MM MISC  PLAN:  Vitamin D Deficiency Morgan Long was informed that low vitamin D levels contribute to fatigue and are associated with obesity, breast, and colon cancer. Yuka agrees to continue to take prescription Vit D @50 ,000 IU every week #4 with no refills and will follow up for routine testing of  vitamin D, at least 2-3 times per year. She was informed of the risk of over-replacement of vitamin D and agrees to not increase her dose unless she discusses this with [name] first. Gillie agrees to follow up in 3 weeks as directed.  Obesity Morgan Long is currently in the action stage of change. As such, her goal is to continue with weight loss efforts. She has agreed to keep a food journal with 1400 calories and 80+ grams of protein.  Morgan Long has been instructed to work up to a goal of 150 minutes of combined cardio and strengthening exercise per week for weight loss and overall health benefits. We discussed the following Behavioral Modification Strategies today: work on meal planning and easy cooking plans, keeping healthy foods in the home and ways to avoid boredom eating. We discussed various medication options to help Morgan Long with her weight loss efforts and we both agreed to start Saxenda @ 0.6 mg with a prescription for Saxenda 3 mg qd #5 pens with nano needles and no refills.  Morgan Long has agreed to follow up with our clinic in 3 weeks. She was informed of the importance of frequent follow up visits to maximize her success with intensive lifestyle modifications for her multiple health conditions.  ALLERGIES: Allergies  Allergen Reactions  . Acetaminophen Itching    Patient can take tylenol, just not tylenol#3  . Hydrocodone Nausea Only  . Oxycodone Itching  . Phosphate Itching    sick  . Codeine Itching and Nausea  Only    MEDICATIONS: Current Outpatient Medications on File Prior to Visit  Medication Sig Dispense Refill  . AMBULATORY NON FORMULARY MEDICATION Medication Name: Domperidone 10 mg three times daily before meals and at bedtime 800 tablet 1  . beclomethasone (QVAR REDIHALER) 80 MCG/ACT inhaler Inhale 2 puffs 2 (two) times daily into the lungs. 10.6 g 5  . budesonide-formoterol (SYMBICORT) 160-4.5 MCG/ACT inhaler Inhale 2 puffs 2 (two) times daily into the lungs. 1 Inhaler 5  . cetirizine  (ZYRTEC) 10 MG tablet TAKE 1 TABLET BY MOUTH DAILY 30 tablet 5  . ESTROGEL 0.75 MG/1.25 GM (0.06%) topical gel Apply 1 application topically See admin instructions.  0  . etonogestrel-ethinyl estradiol (NUVARING) 0.12-0.015 MG/24HR vaginal ring Place 1 each vaginally every 28 (twenty-eight) days. Insert vaginally and leave in place for 3 consecutive weeks, then remove for 1 week.     . famotidine (PEPCID) 20 MG tablet Take 1 tablet (20 mg total) by mouth as needed for heartburn or indigestion. 30 tablet 3  . ibuprofen (ADVIL,MOTRIN) 800 MG tablet Take 800 mg by mouth every 6 (six) hours as needed. for pain  3  . linaclotide (LINZESS) 72 MCG capsule Take 1 capsule (72 mcg total) by mouth daily before breakfast. 30 capsule 3  . metFORMIN (GLUCOPHAGE) 500 MG tablet Take 1 tablet (500 mg total) by mouth daily with breakfast. 30 tablet 0  . Multiple Vitamin (MULTIVITAMIN) LIQD Take 5 mLs by mouth daily.    . ondansetron (ZOFRAN-ODT) 4 MG disintegrating tablet Take 1 tablet (4 mg total) by mouth every 8 (eight) hours as needed for nausea or vomiting. Pharmacy-please d/c rx for 1-2 tabs tid 20 tablet 1  . RESTASIS MULTIDOSE 0.05 % ophthalmic emulsion Place 1 drop into both eyes as needed.  3  . rizatriptan (MAXALT-MLT) 10 MG disintegrating tablet TAKE 1 TABLET (10 MG TOTAL) BY MOUTH AS NEEDED FOR MIGRAINE. MAY REPEAT IN 2 HOURS IF NEEDED 9 tablet 11   No current facility-administered medications on file prior to visit.     PAST MEDICAL HISTORY: Past Medical History:  Diagnosis Date  . Allergic rhinitis   . Asthma   . Chronic migraine    Dr. Catalina Gravel  . Constipation   . Dry eye syndrome of both lacrimal glands   . Dry skin   . Endometriosis   . Fatigue   . Gastroparesis   . GERD (gastroesophageal reflux disease)   . Headache(784.0)    occasional, dx w/ Migraines before, Topamax helps  . Heartburn   . History of stomach ulcers   . Lactose intolerance   . Localized edema   . Migraine   .  Muscle weakness (generalized)   . Nuclear cataract of both eyes    Mild  . Overweight   . Pain in right ankle and joints of right foot   . Shortness of breath   . Sinus complaint   . Sinusitis   . Tired   . TMJ pain dysfunction syndrome    occasional    PAST SURGICAL HISTORY: Past Surgical History:  Procedure Laterality Date  . ANKLE SURGERY Left 11/16/2017  . AUGMENTATION MAMMAPLASTY Bilateral 2006  . BREAST ENHANCEMENT SURGERY  2006  . BUNIONECTOMY    . COLONOSCOPY    . endrometroisis    . fallopian tube removed     Left  . UPPER GASTROINTESTINAL ENDOSCOPY    . wisdoim teeth extraction      SOCIAL HISTORY: Social History   Tobacco Use  .  Smoking status: Never Smoker  . Smokeless tobacco: Never Used  Substance Use Topics  . Alcohol use: Yes    Alcohol/week: 0.0 standard drinks    Comment: socially - occasional   . Drug use: No    FAMILY HISTORY: Family History  Problem Relation Age of Onset  . Throat cancer Mother   . Cancer - Other Mother 55       throat - died 46 months  . Cancer Mother   . Alcoholism Mother   . Heart disease Father   . Cancer Father   . Breast cancer Maternal Aunt        breast  . Colon cancer Maternal Uncle 55       died 90  . Breast cancer Maternal Aunt        breast  . Pancreatic cancer Maternal Aunt   . Diabetes Other        grandmother  . Hypertension Maternal Aunt        several family members  . Throat cancer Maternal Uncle   . Asthma Other        cousin, maternal  . Breast cancer Maternal Aunt        total of 5 aunts  . Breast cancer Maternal Aunt   . Colon cancer Paternal Grandfather   . Heart attack Neg Hx   . Rectal cancer Neg Hx   . Stomach cancer Neg Hx     ROS: Review of Systems  Gastrointestinal: Negative for nausea and vomiting.  Musculoskeletal:       Negative for muscle weakness.    PHYSICAL EXAM: Pt in no acute distress  RECENT LABS AND TESTS: BMET    Component Value Date/Time   NA 140  05/06/2018 1421   K 4.7 05/06/2018 1421   CL 103 05/06/2018 1421   CO2 19 (L) 05/06/2018 1421   GLUCOSE 82 05/06/2018 1421   GLUCOSE 89 02/27/2017 1552   BUN 14 05/06/2018 1421   CREATININE 0.71 05/06/2018 1421   CALCIUM 8.8 05/06/2018 1421   GFRNONAA 100 05/06/2018 1421   GFRAA 116 05/06/2018 1421   Lab Results  Component Value Date   HGBA1C 5.9 (H) 05/06/2018   HGBA1C 5.8 (H) 01/23/2018   HGBA1C 5.5 08/14/2017   HGBA1C 5.6 06/09/2015   HGBA1C 5.7 (H) 05/22/2014   Lab Results  Component Value Date   INSULIN 11.2 05/06/2018   INSULIN 8.4 01/23/2018   INSULIN 8.0 08/14/2017   CBC    Component Value Date/Time   WBC 9.4 01/23/2018 1155   WBC 9.7 03/30/2017 1512   RBC 4.21 01/23/2018 1155   RBC 4.48 03/30/2017 1512   HGB 12.2 01/23/2018 1155   HCT 37.0 01/23/2018 1155   PLT 416 (H) 03/30/2017 1512   MCV 88 01/23/2018 1155   MCH 29.0 01/23/2018 1155   MCH 28.6 03/30/2017 1512   MCHC 33.0 01/23/2018 1155   MCHC 32.5 03/30/2017 1512   RDW 15.4 01/23/2018 1155   LYMPHSABS 1.9 01/23/2018 1155   MONOABS 0.7 02/27/2017 1552   EOSABS 0.1 01/23/2018 1155   BASOSABS 0.0 01/23/2018 1155   Iron/TIBC/Ferritin/ %Sat No results found for: IRON, TIBC, FERRITIN, IRONPCTSAT Lipid Panel     Component Value Date/Time   CHOL 191 05/06/2018 1421   TRIG 109 05/06/2018 1421   HDL 65 05/06/2018 1421   CHOLHDL 3 05/14/2014 1223   VLDL 12.2 05/14/2014 1223   LDLCALC 104 (H) 05/06/2018 1421   Hepatic Function Panel  Component Value Date/Time   PROT 6.9 05/06/2018 1421   ALBUMIN 4.1 05/06/2018 1421   AST 15 05/06/2018 1421   ALT 15 05/06/2018 1421   ALKPHOS 84 05/06/2018 1421   BILITOT <0.2 05/06/2018 1421   BILIDIR 0.0 05/14/2014 1223      Component Value Date/Time   TSH 0.922 05/06/2018 1421   TSH 2.160 01/23/2018 1155   TSH 0.976 08/14/2017 1152   Results for LATANGELA, MCCOMAS A (MRN [id number]) as of 09/17/2018 12:07  Ref. Range 05/06/2018 14:21  Vitamin D, 25-Hydroxy  Latest Ref Range: 30.0 - 100.0 ng/mL 34.0    I, Marcille Blanco, CMA, am acting as transcriptionist for Starlyn Skeans, MD I have reviewed the above documentation for accuracy and completeness, and I agree with the above. -Dennard Nip, MD

## 2018-09-18 ENCOUNTER — Encounter (INDEPENDENT_AMBULATORY_CARE_PROVIDER_SITE_OTHER): Payer: Self-pay

## 2018-09-20 NOTE — Telephone Encounter (Signed)
Patient is calling back would like to know the instructions on how to take the medication.

## 2018-09-20 NOTE — Telephone Encounter (Signed)
I spoke with Morgan Long and instructed her that it is 4 pills a day not 5 that she will be taking of the domperidone. She has worked it out with the [employer] regarding the amount.

## 2018-09-21 ENCOUNTER — Other Ambulatory Visit: Payer: Self-pay

## 2018-09-21 ENCOUNTER — Emergency Department (HOSPITAL_BASED_OUTPATIENT_CLINIC_OR_DEPARTMENT_OTHER): Payer: BLUE CROSS/BLUE SHIELD

## 2018-09-21 ENCOUNTER — Encounter (HOSPITAL_BASED_OUTPATIENT_CLINIC_OR_DEPARTMENT_OTHER): Payer: Self-pay | Admitting: *Deleted

## 2018-09-21 ENCOUNTER — Emergency Department (HOSPITAL_BASED_OUTPATIENT_CLINIC_OR_DEPARTMENT_OTHER)
Admission: EM | Admit: 2018-09-21 | Discharge: 2018-09-22 | Disposition: A | Discharge status: Home / Self Care | Payer: BLUE CROSS/BLUE SHIELD | Attending: Emergency Medicine | Admitting: Emergency Medicine

## 2018-09-21 DIAGNOSIS — J40 Bronchitis, not specified as acute or chronic: Secondary | ICD-10-CM

## 2018-09-21 DIAGNOSIS — Z79899 Other long term (current) drug therapy: Secondary | ICD-10-CM | POA: Insufficient documentation

## 2018-09-21 DIAGNOSIS — R05 Cough: Secondary | ICD-10-CM | POA: Diagnosis not present

## 2018-09-21 DIAGNOSIS — R0602 Shortness of breath: Secondary | ICD-10-CM | POA: Diagnosis not present

## 2018-09-21 LAB — CBC WITH DIFFERENTIAL/PLATELET
Abs Immature Granulocytes: 0.03 10*3/uL (ref 0.00–0.07)
Basophils Absolute: 0.1 10*3/uL (ref 0.0–0.1)
Basophils Relative: 1 %
Eosinophils Absolute: 0.1 10*3/uL (ref 0.0–0.5)
Eosinophils Relative: 1 %
HCT: 40.8 % (ref 36.0–46.0)
Hemoglobin: 13.1 g/dL (ref 12.0–15.0)
Immature Granulocytes: 0 %
Lymphocytes Relative: 19 %
Lymphs Abs: 2.4 10*3/uL (ref 0.7–4.0)
MCH: 28.9 pg (ref 26.0–34.0)
MCHC: 32.1 g/dL (ref 30.0–36.0)
MCV: 89.9 fL (ref 80.0–100.0)
Monocytes Absolute: 0.8 10*3/uL (ref 0.1–1.0)
Monocytes Relative: 7 %
Neutro Abs: 9 10*3/uL — ABNORMAL HIGH (ref 1.7–7.7)
Neutrophils Relative %: 72 %
Platelets: 357 10*3/uL (ref 150–400)
RBC: 4.54 MIL/uL (ref 3.87–5.11)
RDW: 14 % (ref 11.5–15.5)
WBC: 12.4 10*3/uL — ABNORMAL HIGH (ref 4.0–10.5)
nRBC: 0 % (ref 0.0–0.2)

## 2018-09-21 LAB — TROPONIN I: Troponin I: <0.03 ng/mL (ref <0.03)

## 2018-09-21 LAB — COMPREHENSIVE METABOLIC PANEL
ALT: 22 U/L (ref 0–44)
AST: 20 U/L (ref 15–41)
Albumin: 3.7 g/dL (ref 3.5–5.0)
Alkaline Phosphatase: 68 U/L (ref 38–126)
Anion gap: 8 (ref 5–15)
BUN: 12 mg/dL (ref 6–20)
CO2: 26 mmol/L (ref 22–32)
Calcium: 8.9 mg/dL (ref 8.9–10.3)
Chloride: 101 mmol/L (ref 98–111)
Creatinine, Ser: 0.72 mg/dL (ref 0.44–1.00)
GFR calc Af Amer: >60 mL/min (ref >60)
GFR calc non Af Amer: >60 mL/min (ref >60)
Glucose, Bld: 87 mg/dL (ref 70–99)
Potassium: 3.6 mmol/L (ref 3.5–5.1)
Sodium: 135 mmol/L (ref 135–145)
Total Bilirubin: 0.3 mg/dL (ref 0.3–1.2)
Total Protein: 7.9 g/dL (ref 6.5–8.1)

## 2018-09-21 LAB — LIPASE, BLOOD: Lipase: 28 U/L (ref 11–51)

## 2018-09-21 MED ORDER — PROMETHAZINE HCL 25 MG/ML IJ SOLN
INTRAMUSCULAR | Status: AC
  Filled 2018-09-21: qty 1

## 2018-09-21 MED ORDER — ONDANSETRON HCL 4 MG/2ML IJ SOLN
4.0000 mg | Freq: Once | INTRAMUSCULAR | Status: AC
  Administered 2018-09-21: 4 mg via INTRAVENOUS
  Filled 2018-09-21: qty 2

## 2018-09-21 MED ORDER — ONDANSETRON 4 MG PO TBDP: ORAL_TABLET | ORAL | 0 refills | Status: DC

## 2018-09-21 MED ORDER — PROMETHAZINE HCL 25 MG/ML IJ SOLN
12.5000 mg | Freq: Once | INTRAMUSCULAR | Status: AC
  Administered 2018-09-21: 12.5 mg via INTRAVENOUS

## 2018-09-21 MED ORDER — DIPHENHYDRAMINE HCL 50 MG/ML IJ SOLN
25.0000 mg | Freq: Once | INTRAMUSCULAR | Status: AC
  Administered 2018-09-21: 25 mg via INTRAVENOUS
  Filled 2018-09-21: qty 1

## 2018-09-21 MED ORDER — SODIUM CHLORIDE 0.9 % IV BOLUS
1000.0000 mL | Freq: Once | INTRAVENOUS | Status: AC
  Administered 2018-09-21: 1000 mL via INTRAVENOUS

## 2018-09-21 MED ORDER — AZITHROMYCIN 250 MG PO TABS: ORAL_TABLET | ORAL | 0 refills | Status: DC

## 2018-09-21 MED ORDER — METOCLOPRAMIDE HCL 5 MG/ML IJ SOLN
10.0000 mg | Freq: Once | INTRAMUSCULAR | Status: AC
  Administered 2018-09-21: 10 mg via INTRAVENOUS
  Filled 2018-09-21: qty 2

## 2018-09-21 NOTE — Discharge Instructions (Addendum)
Take zpack as prescribed.   Continue zofran for nausea.   See COVID 19 stay home guidelines.   See your doctor  Return to ER if you have worse nausea, vomiting, trouble breathing, fever, dehydration.      Person Under Monitoring Name: [facility]  Location: [place] [address]   Infection Prevention Recommendations for Individuals Confirmed to have, or Being Evaluated for, 2019 Novel Coronavirus (COVID-19) Infection Who Receive Care at Home  Individuals who are confirmed to have, or are being evaluated for, COVID-19 should follow the prevention steps below until a healthcare provider or local or state health department says they can return to normal activities.  Stay home except to get medical care You should restrict activities outside your home, except for getting medical care. Do not go to work, school, or public areas, and do not use public transportation or taxis.  Call ahead before visiting your doctor Before your medical appointment, call the healthcare provider and tell them that you have, or are being evaluated for, COVID-19 infection. This will help the healthcare providers office take steps to keep other people from getting infected. Ask your healthcare provider to call the local or state health department.  Monitor your symptoms Seek prompt medical attention if your illness is worsening (e.g., difficulty breathing). Before going to your medical appointment, call the healthcare provider and tell them that you have, or are being evaluated for, COVID-19 infection. Ask your healthcare provider to call the local or state health department.  Wear a facemask You should wear a facemask that covers your nose and mouth when you are in the same room with other people and when you visit a healthcare provider. People who live with or visit you should also wear a facemask while they are in the same room with you.  Separate yourself from other  people in your home As much as possible, you should stay in a different room from other people in your home. Also, you should use a separate bathroom, if available.  Avoid sharing household items You should not share dishes, drinking glasses, cups, eating utensils, towels, bedding, or other items with other people in your home. After using these items, you should wash them thoroughly with soap and water.  Cover your coughs and sneezes Cover your mouth and nose with a tissue when you cough or sneeze, or you can cough or sneeze into your sleeve. Throw used tissues in a lined trash can, and immediately wash your hands with soap and water for at least 20 seconds or use an alcohol-based hand rub.  Wash your [facility] your hands often and thoroughly with soap and water for at least 20 seconds. You can use an alcohol-based hand sanitizer if soap and water are not available and if your hands are not visibly dirty. Avoid touching your eyes, nose, and mouth with unwashed hands.   Prevention Steps for Caregivers and Household Members of Individuals Confirmed to have, or Being Evaluated for, COVID-19 Infection Being Cared for in the Home  If you live with, or provide care at home for, a person confirmed to have, or being evaluated for, COVID-19 infection please follow these guidelines to prevent infection:  Follow healthcare providers instructions Make sure that you understand and can help the patient follow any healthcare provider instructions for all care.  Provide for the patients basic needs You should help the patient with basic needs in the home and provide support for getting  groceries, prescriptions, and other personal needs.  Monitor the patients symptoms If they are getting sicker, call his or her medical provider and tell them that the patient has, or is being evaluated for, COVID-19 infection. This will help the healthcare providers office take steps to keep other people from  getting infected. Ask the healthcare provider to call the local or state health department.  Limit the number of people who have contact with the patient If possible, have only one caregiver for the patient. Other household members should stay in another home or place of residence. If this is not possible, they should stay in another room, or be separated from the patient as much as possible. Use a separate bathroom, if available. Restrict visitors who do not have an essential need to be in the home.  Keep older adults, very young children, and other sick people away from the patient Keep older adults, very young children, and those who have compromised immune systems or chronic health conditions away from the patient. This includes people with chronic heart, lung, or kidney conditions, diabetes, and cancer.  Ensure good ventilation Make sure that shared spaces in the home have good air flow, such as from an air conditioner or an opened window, weather permitting.  Wash your hands often Wash your hands often and thoroughly with soap and water for at least 20 seconds. You can use an alcohol based hand sanitizer if soap and water are not available and if your hands are not visibly dirty. Avoid touching your eyes, nose, and mouth with unwashed hands. Use disposable paper towels to dry your hands. If not available, use dedicated cloth towels and replace them when they become wet.  Wear a facemask and gloves Wear a disposable facemask at all times in the room and gloves when you touch or have contact with the patients blood, body fluids, and/or secretions or excretions, such as sweat, saliva, sputum, nasal mucus, vomit, urine, or feces.  Ensure the mask fits over your nose and mouth tightly, and do not touch it during use. Throw out disposable facemasks and gloves after using them. Do not reuse. Wash your hands immediately after removing your facemask and gloves. If your personal clothing  becomes contaminated, carefully remove clothing and launder. Wash your hands after handling contaminated clothing. Place all used disposable facemasks, gloves, and other waste in a lined container before disposing them with other household waste. Remove gloves and wash your hands immediately after handling these items.  Do not share dishes, glasses, or other household items with the patient Avoid sharing household items. You should not share dishes, drinking glasses, cups, eating utensils, towels, bedding, or other items with a patient who is confirmed to have, or being evaluated for, COVID-19 infection. After the person uses these items, you should wash them thoroughly with soap and water.  Wash laundry thoroughly Immediately remove and wash clothes or bedding that have blood, body fluids, and/or secretions or excretions, such as sweat, saliva, sputum, nasal mucus, vomit, urine, or feces, on them. Wear gloves when handling laundry from the patient. Read and follow directions on labels of laundry or clothing items and detergent. In general, wash and dry with the warmest temperatures recommended on the label.  Clean all areas the individual has used often Clean all touchable surfaces, such as counters, tabletops, doorknobs, bathroom fixtures, toilets, phones, keyboards, tablets, and bedside tables, every day. Also, clean any surfaces that may have blood, body fluids, and/or secretions or excretions on them. Wear  gloves when cleaning surfaces the patient has come in contact with. Use a diluted bleach solution (e.g., dilute bleach with 1 part bleach and 10 parts water) or a household disinfectant with a label that says EPA-registered for coronaviruses. To make a bleach solution at home, add 1 tablespoon of bleach to 1 quart (4 cups) of water. For a larger supply, add  cup of bleach to 1 gallon (16 cups) of water. Read labels of cleaning products and follow recommendations provided on product labels.  Labels contain instructions for safe and effective use of the cleaning product including precautions you should take when applying the product, such as wearing gloves or eye protection and making sure you have good ventilation during use of the product. Remove gloves and wash hands immediately after cleaning.  Monitor yourself for signs and symptoms of illness Caregivers and household members are considered close contacts, should monitor their health, and will be asked to limit movement outside of the home to the extent possible. Follow the monitoring steps for close contacts listed on the symptom monitoring form.   ? If you have additional questions, contact your local health department or call the epidemiologist on call at [phone] (available 24/7). ? This guidance is subject to change. For the most up-to-date guidance from [facility], please refer to their website: [email or web address]

## 2018-09-21 NOTE — ED Notes (Signed)
Pt states she still feels nauseous. EDP made aware

## 2018-09-21 NOTE — ED Triage Notes (Signed)
Pt reports cough, subjective fever, mild SOB, nausea, and headache today. No known covid exposure but her aunt has had recent respiratory illness

## 2018-09-21 NOTE — ED Provider Notes (Signed)
[place] EMERGENCY DEPARTMENT Provider Note   CSN: [id number] Arrival date & time: 09/21/18  2159    History   Chief Complaint Chief Complaint  Patient presents with  . Cough  . Nausea    HPI Morgan Long is a 48 y.o. female history of asthma, chronic migraines, gastroparesis, here presenting with nausea, shortness of breath.  Patient states that she has chronic gastroparesis and has chronic nausea.  She states that she started having subjective fever and mild shortness of breath earlier today.  She has some mild headaches which is typical of her migraines.  Patient is requesting testing for coronavirus but has no known COVID exposures.  She states that her aunt has some cough as well but has not been tested for COVID. Patient denies smoking.      The history is provided by the patient.    Past Medical History:  Diagnosis Date  . Allergic rhinitis   . Asthma   . Chronic migraine    Dr. Catalina Gravel  . Constipation   . Dry eye syndrome of both lacrimal glands   . Dry skin   . Endometriosis   . Fatigue   . Gastroparesis   . GERD (gastroesophageal reflux disease)   . Headache(784.0)    occasional, dx w/ Migraines before, Topamax helps  . Heartburn   . History of stomach ulcers   . Lactose intolerance   . Localized edema   . Migraine   . Muscle weakness (generalized)   . Nuclear cataract of both eyes    Mild  . Overweight   . Pain in right ankle and joints of right foot   . Shortness of breath   . Sinus complaint   . Sinusitis   . Tired   . TMJ pain dysfunction syndrome    occasional    Patient Active Problem List   Diagnosis Date Noted  . Acute pain of left shoulder 03/12/2018  . Other fatigue 08/14/2017  . Shortness of breath on exertion 08/14/2017  . Other insomnia 08/14/2017  . Medication management 06/12/2017  . Nonintractable headache 06/12/2017  . Gastroparesis 06/12/2017  . Severe obesity (BMI >= 40) ([place]) 04/25/2017  . Leukocytosis  03/01/2017  . Obesity (BMI 35.0-39.9 without comorbidity) 03/01/2017  . Chronic migraine without aura without status migrainosus, not intractable 03/01/2017  . Sinusitis 07/31/2016  . Influenza-like illness 07/12/2016  . Peroneal tendinitis of left lower extremity 11/03/2015  . Pes anserine bursitis 11/03/2015  . Asthma with acute exacerbation 06/15/2015  . Acute sinusitis 06/15/2015  . PCP NOTES >>>> 02/23/2015  . GERD (gastroesophageal reflux disease) 01/13/2015  . Myalgia and myositis 07/22/2014  . 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 Paresthesias 06/02/2014  . Acute pharyngitis 05/20/2014  . Strep pharyngitis 05/20/2014  . Degenerative cervical disc 10/28/2013  . Bursitis, shoulder 09/17/2013  . Muscle spasm of back 09/11/2013  . Nonallopathic lesion of thoracic region 09/11/2013  . General medical examination 03/28/2011  . Allergic rhinitis   . TMJ PAIN 12/16/2007  . Headache(784.0) 12/16/2007    Past Surgical History:  Procedure Laterality Date  . ANKLE SURGERY Left 11/16/2017  . AUGMENTATION MAMMAPLASTY Bilateral 2006  . BREAST ENHANCEMENT SURGERY  2006  . BUNIONECTOMY    . COLONOSCOPY    . endrometroisis    . fallopian tube removed     Left  . UPPER GASTROINTESTINAL ENDOSCOPY    . wis[place]im teeth extraction       OB History    [place]  0   Para  0   Term  0   Preterm  0   AB  0   Living  0     SAB  0   TAB  0   Ectopic  0   Multiple  0   Live Births  0            Home Medications    Prior to Admission medications   Medication Sig Start Date End Date Taking? Authorizing Provider  AMBULATORY NON FORMULARY MEDICATION Medication Name: Domperidone 10 mg three times daily before meals and at bedtime 09/12/18   Pyrtle, Lajuan Lines, MD  beclomethasone (QVAR REDIHALER) 80 MCG/ACT inhaler Inhale 2 puffs 2 (two) times daily into the lungs. 04/12/17   [name], DO  budesonide-formoterol (SYMBICORT) 160-4.5 MCG/ACT inhaler Inhale 2 puffs 2  (two) times daily into the lungs. 04/16/17   Bobbitt, Sedalia Muta, MD  cetirizine (ZYRTEC) 10 MG tablet TAKE 1 TABLET BY MOUTH DAILY 05/10/18   Carollee Herter, Yvonne R, DO  ESTROGEL 0.75 MG/1.25 GM (0.06%) topical gel Apply 1 application topically See admin instructions. 05/18/15   [provider]  etonogestrel-ethinyl estradiol (NUVARING) 0.12-0.015 MG/24HR vaginal ring Place 1 each vaginally every 28 (twenty-eight) days. Insert vaginally and leave in place for 3 consecutive weeks, then remove for 1 week.     [provider]  famotidine (PEPCID) 20 MG tablet Take 1 tablet (20 mg total) by mouth as needed for heartburn or indigestion. 06/20/18   Pyrtle, Lajuan Lines, MD  ibuprofen (ADVIL,MOTRIN) 800 MG tablet Take 800 mg by mouth every 6 (six) hours as needed. for pain 11/27/16   [provider]  Insulin Pen Needle (BD PEN NEEDLE NANO 2ND GEN) 32G X 4 MM MISC 1 Package by Does not apply route 2 (two) times daily. 09/17/18   Dennard Nip D, MD  linaclotide Rolan Lipa) 72 MCG capsule Take 1 capsule (72 mcg total) by mouth daily before breakfast. 06/20/18   Pyrtle, Lajuan Lines, MD  Liraglutide -Weight Management (SAXENDA) 18 MG/3ML SOPN Inject 3 mg into the skin daily. 09/17/18   Dennard Nip D, MD  metFORMIN (GLUCOPHAGE) 500 MG tablet Take 1 tablet (500 mg total) by mouth daily with breakfast. 08/29/18   Dennard Nip D, MD  Multiple Vitamin (MULTIVITAMIN) LIQD Take 5 mLs by mouth daily.    [provider]  ondansetron (ZOFRAN-ODT) 4 MG disintegrating tablet Take 1 tablet (4 mg total) by mouth every 8 (eight) hours as needed for nausea or vomiting. Pharmacy-please d/c rx for 1-2 tabs tid 06/20/18   Pyrtle, Lajuan Lines, MD  RESTASIS MULTIDOSE 0.05 % ophthalmic emulsion Place 1 drop into both eyes as needed. 11/25/16   [provider]  rizatriptan (MAXALT-MLT) 10 MG disintegrating tablet TAKE 1 TABLET (10 MG TOTAL) BY MOUTH AS NEEDED FOR MIGRAINE. MAY REPEAT IN 2 HOURS IF NEEDED 05/06/18    Star Age, MD  Vitamin D, Ergocalciferol, (DRISDOL) 1.25 MG (50000 UT) CAPS capsule Take 1 capsule (50,000 Units total) by mouth every 7 (seven) days. 09/17/18   Starlyn Skeans, MD    Family History Family History  Problem Relation Age of Onset  . Throat cancer Mother   . Cancer - Other Mother 55       throat - died 74 months  . Cancer Mother   . Alcoholism Mother   . Heart disease Father   . Cancer Father   . Breast cancer Maternal Aunt  breast  . Colon cancer Maternal Uncle 5       died 38  . Breast cancer Maternal Aunt        breast  . Pancreatic cancer Maternal Aunt   . Diabetes Other        grandmother  . Hypertension Maternal Aunt        several family members  . Throat cancer Maternal Uncle   . Asthma Other        cousin, maternal  . Breast cancer Maternal Aunt        total of 5 aunts  . Breast cancer Maternal Aunt   . Colon cancer Paternal Grandfather   . Heart attack Neg Hx   . Rectal cancer Neg Hx   . Stomach cancer Neg Hx     Social History Social History   Tobacco Use  . Smoking status: Never Smoker  . Smokeless tobacco: Never Used  Substance Use Topics  . Alcohol use: Yes    Alcohol/week: 0.0 standard drinks    Comment: socially - occasional   . Drug use: No     Allergies   Acetaminophen; Hydrocodone; Oxycodone; Phosphate; and Codeine   Review of Systems Review of Systems  Respiratory: Positive for shortness of breath.   Gastrointestinal: Positive for nausea.  Neurological: Positive for headaches.  All other systems reviewed and are negative.    Physical Exam Updated Vital Signs BP 124/82 (BP Location: Right Arm)   Pulse 93   Temp 99.2 F (37.3 C) (Oral)   Resp 16   Ht 5\' 3"  (1.6 m)   Wt 88.5 kg   LMP 09/13/2018   SpO2 97%   BMI 34.54 kg/m   Physical Exam Vitals signs and nursing note reviewed.  HENT:     Head: Normocephalic.     Nose: Nose normal.     Mouth/Throat:     Mouth: Mucous membranes are dry.  Eyes:      Extraocular Movements: Extraocular movements intact.     Pupils: Pupils are equal, round, and reactive to light.  Neck:     Musculoskeletal: Normal range of motion.  Cardiovascular:     Rate and Rhythm: Normal rate and regular rhythm.     Pulses: Normal pulses.     Heart sounds: Normal heart sounds.  Pulmonary:     Effort: Pulmonary effort is normal.     Breath sounds: Normal breath sounds.     Comments: No wheezing or crackles  Abdominal:     General: Abdomen is flat.     Palpations: Abdomen is soft.  Musculoskeletal: Normal range of motion.  Skin:    General: Skin is warm.     Capillary Refill: Capillary refill takes less than 2 seconds.  Neurological:     General: No focal deficit present.     Mental Status: She is alert and oriented to person, place, and time.     Cranial Nerves: No cranial nerve deficit.     Sensory: No sensory deficit.     Motor: No weakness.     Comments: CN 2- 12 intact, nl strength and sensation throughout   Psychiatric:        Mood and Affect: Mood normal.        Behavior: Behavior normal.      ED Treatments / Results  Labs (all labs ordered are listed, but only abnormal results are displayed) Labs Reviewed  CBC WITH DIFFERENTIAL/PLATELET  COMPREHENSIVE METABOLIC PANEL  TROPONIN I  LIPASE, BLOOD  EKG None  Radiology No results found.  Procedures Procedures (including critical care time)  Medications Ordered in ED Medications  sodium chloride 0.9 % bolus 1,000 mL (has no administration in time range)  ondansetron (ZOFRAN) injection 4 mg (has no administration in time range)  metoCLOPramide (REGLAN) injection 10 mg (has no administration in time range)  diphenhydrAMINE (BENADRYL) injection 25 mg (has no administration in time range)     Initial Impression / Assessment and Plan / ED Course  I have reviewed the triage vital signs and the nursing notes.  Pertinent labs & imaging results that were available during my care of  the patient were reviewed by me and considered in my medical decision making (see chart for details).        ALTHA SWEITZER is a 48 y.o. female here with SOB, cough, nausea. Patient is requesting COVID testing.  I explained to her current testing guidelines and that she is considered low risk and does not meet criteria for testing.  She does have history of gastroparesis and that may be contributing to her symptoms.  Will get labs, CXR. Will hydrate and reassess.   11:36 PM Labs unremarkable. CXR clear. Nausea improved with IVF, nausea meds. I told her that she doesn't meet criteria for COVID testing and gave her self isolation guidelines. Will give zpack for bronchitis    Hillary A Borrelli was evaluated in Emergency Department on 09/21/2018 for the symptoms described in the history of present illness. She was evaluated in the context of the global COVID-19 pandemic, which necessitated consideration that the patient might be at risk for infection with the SARS-CoV-2 virus that causes COVID-19. Institutional protocols and algorithms that pertain to the evaluation of patients at risk for COVID-19 are in a state of rapid change based on information released by regulatory bodies including the CDC and federal and state organizations. These policies and algorithms were followed during the patient's care in the ED.   Final Clinical Impressions(s) / ED Diagnoses   Final diagnoses:  None    ED Discharge Orders    None       Drenda Freeze, MD 09/21/18 2337

## 2018-09-22 ENCOUNTER — Encounter: Payer: Self-pay | Admitting: Family Medicine

## 2018-09-22 NOTE — ED Notes (Signed)
Pt understood dc material. NAD noted. Scripts given at [employer]. All questions answered to satisfaction. Pt escorted to checkout counter.

## 2018-09-23 NOTE — Telephone Encounter (Signed)
These labs were not done by [name]--- looks like they were done by [facility] Was she sick when labs done--- elevated wbc can mean infection

## 2018-09-23 NOTE — Telephone Encounter (Signed)
Wbc up because she was sick--- if still sick- we can do virtual visit

## 2018-09-23 NOTE — Telephone Encounter (Signed)
She was in the ED on 09/21/18.  Do you want me to try and set her up for an virtual visit for follow up today?

## 2018-09-24 ENCOUNTER — Ambulatory Visit (INDEPENDENT_AMBULATORY_CARE_PROVIDER_SITE_OTHER): Payer: BLUE CROSS/BLUE SHIELD | Admitting: Family Medicine

## 2018-09-24 ENCOUNTER — Encounter: Payer: Self-pay | Admitting: Family Medicine

## 2018-09-24 ENCOUNTER — Other Ambulatory Visit: Payer: Self-pay

## 2018-09-24 DIAGNOSIS — J40 Bronchitis, not specified as acute or chronic: Secondary | ICD-10-CM | POA: Diagnosis not present

## 2018-09-24 DIAGNOSIS — M7752 Other enthesopathy of left foot: Secondary | ICD-10-CM | POA: Insufficient documentation

## 2018-09-24 DIAGNOSIS — R51 Headache: Secondary | ICD-10-CM | POA: Diagnosis not present

## 2018-09-24 DIAGNOSIS — R519 Headache, unspecified: Secondary | ICD-10-CM

## 2018-09-24 DIAGNOSIS — M659 Synovitis and tenosynovitis, unspecified: Secondary | ICD-10-CM | POA: Diagnosis not present

## 2018-09-24 MED ORDER — TRAMADOL HCL 50 MG PO TABS: 50.0000 mg | ORAL_TABLET | Freq: Three times a day (TID) | ORAL | 0 refills | Status: AC | PRN

## 2018-09-24 MED ORDER — DOXYCYCLINE HYCLATE 100 MG PO TABS: 100.0000 mg | ORAL_TABLET | Freq: Two times a day (BID) | ORAL | 0 refills | Status: DC

## 2018-09-24 MED ORDER — BENZONATATE 200 MG PO CAPS: 200.0000 mg | ORAL_CAPSULE | Freq: Two times a day (BID) | ORAL | 0 refills | Status: DC | PRN

## 2018-09-24 NOTE — Telephone Encounter (Signed)
appt scheduled for today

## 2018-09-24 NOTE — Progress Notes (Signed)
Virtual Visit via Video Note  I connected with Morgan Long on 09/24/18 at  2:45 PM EDT by a video enabled telemedicine application and verified that I am speaking with the correct person using two identifiers.   I discussed the limitations of evaluation and management by telemedicine and the availability of in person appointments. The patient expressed understanding and agreed to proceed.  History of Present Illness: Pt is home f/u from er for bronchitis and possible bronchitis   She was put on z pack -- she is slightly better but still running low grade fevers  She is also unable to get her botox for migraines and is concerned about getting a headache--- would like something to take until she can get back into neuro   Observations/Objective: 99.7   Pt unable to get any other vs for [name] today Pt is in NAD No sob, no cp  Assessment and Plan: 1. Bronchitis Er visit reviewed Start doxy,  Tessalon for cough rto or call if no improvement - doxycycline (VIBRA-TABS) 100 MG tablet; Take 1 tablet (100 mg total) by mouth 2 (two) times daily.  Dispense: 20 tablet; Refill: 0 - benzonatate (TESSALON) 200 MG capsule; Take 1 capsule (200 mg total) by mouth 2 (two) times daily as needed for cough.  Dispense: 20 capsule; Refill: 0  2. Nonintractable headache, unspecified chronicity pattern, unspecified headache type She is unable to get her botox injection and needs something for ha to use in the meantime  - traMADol (ULTRAM) 50 MG tablet; Take 1 tablet (50 mg total) by mouth every 8 (eight) hours as needed for up to 5 days.  Dispense: 15 tablet; Refill: 0   Follow Up Instructions:    I discussed the assessment and treatment plan with the patient. The patient was provided an opportunity to ask questions and all were answered. The patient agreed with the plan and demonstrated an understanding of the instructions.   The patient was advised to call back or seek an in-person evaluation if the symptoms  worsen or if the condition fails to improve as anticipated.  I provided 25 minutes of non-face-to-face time during this encounter.   [name], DO

## 2018-09-25 ENCOUNTER — Encounter: Payer: Self-pay | Admitting: Family Medicine

## 2018-09-25 ENCOUNTER — Other Ambulatory Visit: Payer: Self-pay | Admitting: Family Medicine

## 2018-09-25 DIAGNOSIS — R110 Nausea: Secondary | ICD-10-CM

## 2018-09-25 MED ORDER — PROMETHAZINE HCL 25 MG PO TABS: 25.0000 mg | ORAL_TABLET | Freq: Three times a day (TID) | ORAL | 0 refills | Status: DC | PRN

## 2018-09-28 ENCOUNTER — Encounter: Payer: Self-pay | Admitting: Family Medicine

## 2018-09-30 ENCOUNTER — Telehealth: Payer: Self-pay | Admitting: Neurology

## 2018-09-30 MED ORDER — UBROGEPANT 50 MG PO TABS: 50.0000 mg | ORAL_TABLET | Freq: Two times a day (BID) | ORAL | 3 refills | Status: DC | PRN

## 2018-09-30 NOTE — Telephone Encounter (Signed)
Error

## 2018-09-30 NOTE — Telephone Encounter (Signed)
She can start [name], 50 mg: 1 pill at onset of migraine, may repeat after 2 hours. I would not recommend combining it with the Maxalt, but she can continue with the Maxalt too. The most common side effect is nausea, also sleepiness. It is one of the newer medications approved for acute migraines, a so-called CGRP receptor antagonists. She can go online to look for a coupon in case her co-pay is high. Please call pt back. Rx sent to [facility] on [address].

## 2018-09-30 NOTE — Telephone Encounter (Signed)
Not sure if you wanted to add anything to form.

## 2018-09-30 NOTE — Telephone Encounter (Signed)
I called pt. She is having 3-4 migraines per week that last up to 6 hours. She has associated nausea/vomiting. She only gets 9 tablets of maxalt per month. She is wondering if there is any other medications Dr. Rexene Alberts can offer her. She believed the increase in migraines is likely due to her flare of bronchitis and allergies due to pollen. Her botox appt is 10/24/18, last botox was on 07/17/2018.

## 2018-09-30 NOTE — Telephone Encounter (Signed)
If she is struggling to breath she should go to ER ---that with the fever she may need to be tested

## 2018-09-30 NOTE — Telephone Encounter (Signed)
Pt has a question on her appt and she has been experiencing bad headache's and would like to know if something can be given to her until her appt time. Please advise.

## 2018-09-30 NOTE — Telephone Encounter (Signed)
I called pt and discussed this with her. She will use the "u-save" card on [employer]'s website, she has [place]. Should be able to get one month for $10 and 12 refills. Pt verbalized understanding to not combine maxalt and [employer]rev[employer]. Pt verbalized understanding of recommendations. Pt had no questions at this time but was encouraged to call back if questions arise.

## 2018-10-01 ENCOUNTER — Ambulatory Visit (INDEPENDENT_AMBULATORY_CARE_PROVIDER_SITE_OTHER): Payer: BLUE CROSS/BLUE SHIELD | Admitting: Family Medicine

## 2018-10-01 ENCOUNTER — Encounter: Payer: Self-pay | Admitting: Family Medicine

## 2018-10-01 NOTE — Telephone Encounter (Signed)
Ok to change to June 1

## 2018-10-01 NOTE — Telephone Encounter (Signed)
Patient is requesting dates 09/30/18 til 11/02/18 with expected date of return on 11/04/18.  Are you ok with that for the diagnosis for bronchitis and possible exposure to corona virus.  Also the only diagnosis code I could find for the corona is suspected covid virus and exposure to covid.  Which would you like?

## 2018-10-07 NOTE — Telephone Encounter (Signed)
Completed PA for [employer] via covermymeds, sent to [facility]. Should have determination in 1-3 business days. If PA is denied, pt has already been instructed to use the "u-save" card.   Key: [id number]

## 2018-10-08 ENCOUNTER — Encounter (INDEPENDENT_AMBULATORY_CARE_PROVIDER_SITE_OTHER): Payer: Self-pay

## 2018-10-08 ENCOUNTER — Ambulatory Visit (INDEPENDENT_AMBULATORY_CARE_PROVIDER_SITE_OTHER): Payer: Self-pay | Admitting: Family Medicine

## 2018-10-08 NOTE — Telephone Encounter (Signed)
PA approved for ubrelvy from 5/4/20202-12/29/18 by [place]. Ref # [id number]. Pt has already been instructed to use the "u-save" card.

## 2018-10-09 ENCOUNTER — Ambulatory Visit (INDEPENDENT_AMBULATORY_CARE_PROVIDER_SITE_OTHER): Payer: BLUE CROSS/BLUE SHIELD | Admitting: Family Medicine

## 2018-10-09 ENCOUNTER — Encounter: Payer: Self-pay | Admitting: Family Medicine

## 2018-10-09 ENCOUNTER — Encounter (INDEPENDENT_AMBULATORY_CARE_PROVIDER_SITE_OTHER): Payer: Self-pay | Admitting: Family Medicine

## 2018-10-09 ENCOUNTER — Other Ambulatory Visit: Payer: Self-pay

## 2018-10-09 DIAGNOSIS — Z6834 Body mass index (BMI) 34.0-34.9, adult: Secondary | ICD-10-CM

## 2018-10-09 DIAGNOSIS — E559 Vitamin D deficiency, unspecified: Secondary | ICD-10-CM

## 2018-10-09 DIAGNOSIS — N760 Acute vaginitis: Secondary | ICD-10-CM | POA: Insufficient documentation

## 2018-10-09 DIAGNOSIS — G43709 Chronic migraine without aura, not intractable, without status migrainosus: Secondary | ICD-10-CM | POA: Diagnosis not present

## 2018-10-09 DIAGNOSIS — E669 Obesity, unspecified: Secondary | ICD-10-CM | POA: Diagnosis not present

## 2018-10-09 MED ORDER — VITAMIN D (ERGOCALCIFEROL) 1.25 MG (50000 UNIT) PO CAPS: 50000.0000 [IU] | ORAL_CAPSULE | ORAL | 0 refills | Status: DC

## 2018-10-09 MED ORDER — FLUCONAZOLE 150 MG PO TABS: ORAL_TABLET | ORAL | 0 refills | Status: DC

## 2018-10-09 NOTE — Assessment & Plan Note (Signed)
Pt concerned about yeast with doxy Diflucan sent to the pharmacy

## 2018-10-09 NOTE — Progress Notes (Signed)
Office: [phone]  /  Fax: [phone] TeleHealth Visit:  Morgan Long has verbally consented to this TeleHealth visit today. The patient is located at home, the provider is located at the [facility] office. The participants in this visit include the listed provider and patient. The visit was conducted today via [email or web address].  HPI:   Chief Complaint: OBESITY Morgan Long is here to discuss her progress with her obesity treatment plan. She is keeping a food journal with 1400 calories and 80+ grams of protein and is following her eating plan approximately 80 % of the time. She states she is exercising 0 minutes 0 times per week. Morgan Long has been struggling with migraines and nausea, so she has struggled to follow her meal plan. She feels that she has lost some weight though. Morgan Long is also on Saxenda at a low dose of 0.6 mg. She also has bronchitis and hasn't been able to exercise.  We were unable to weigh the patient today for this TeleHealth visit. She feels as if she has lost weight since her last visit. She has lost 2 lbs since starting treatment with [name].  Vitamin D Deficiency Morgan Long has a diagnosis of vitamin D deficiency. She is currently stable on vit D, but is not yet at goal. Morgan Long denies nausea, vomiting, or muscle weakness.  ASSESSMENT AND PLAN:  Vitamin D deficiency - Plan: Vitamin D, Ergocalciferol, (DRISDOL) 1.25 MG (50000 UT) CAPS capsule  Class 1 obesity with serious comorbidity and body mass index (BMI) of 34.0 to 34.9 in adult, unspecified obesity type  PLAN:  Vitamin D Deficiency Morgan Long was informed that low vitamin D levels contribute to fatigue and are associated with obesity, breast, and colon cancer. Morgan Long agrees to continue to take prescription Vit D @50 ,000 IU every week #4 with no refills and will follow up for routine testing of vitamin D, at least 2-3 times per year. She was informed of the risk of over-replacement of vitamin D and agrees to not increase her dose  unless she discusses this with [name] first. Morgan Long agrees to follow up in 2 weeks as directed.  Obesity Morgan Long is currently in the action stage of change. As such, her goal is to continue with weight loss efforts. She has agreed to keep a food journal with 1400 calories and 80+ grams of protein.  Morgan Long has been instructed to hold exercise except for light walking until she is recovered from the bronchitis. We discussed the following Behavioral Modification Strategies today: increasing lean protein intake, no skipping meals, and better snacking choices. We discussed various medication options to help Morgan Long with her weight loss efforts and we both to stop Saxenda for now until her headaches and nausea have resolved.  Morgan Long has agreed to follow up with our clinic in 2 weeks. She was informed of the importance of frequent follow up visits to maximize her success with intensive lifestyle modifications for her multiple health conditions.  ALLERGIES: Allergies  Allergen Reactions  . Acetaminophen Itching    Patient can take tylenol, just not tylenol#3  . Hydrocodone Nausea Only  . Oxycodone Itching  . Phosphate Itching    sick  . Codeine Itching and Nausea Only    MEDICATIONS: Current Outpatient Medications on File Prior to Visit  Medication Sig Dispense Refill  . AMBULATORY NON FORMULARY MEDICATION Medication Name: Domperidone 10 mg three times daily before meals and at bedtime 800 tablet 1  . azithromycin (ZITHROMAX Z-PAK) 250 MG tablet 2 po day one,  then 1 daily x 4 days 6 tablet 0  . beclomethasone (QVAR REDIHALER) 80 MCG/ACT inhaler Inhale 2 puffs 2 (two) times daily into the lungs. 10.6 g 5  . benzonatate (TESSALON) 200 MG capsule Take 1 capsule (200 mg total) by mouth 2 (two) times daily as needed for cough. 20 capsule 0  . budesonide-formoterol (SYMBICORT) 160-4.5 MCG/ACT inhaler Inhale 2 puffs 2 (two) times daily into the lungs. 1 Inhaler 5  . cetirizine (ZYRTEC) 10 MG tablet TAKE 1 TABLET BY  MOUTH DAILY 30 tablet 5  . doxycycline (VIBRA-TABS) 100 MG tablet Take 1 tablet (100 mg total) by mouth 2 (two) times daily. 20 tablet 0  . ESTROGEL 0.75 MG/1.25 GM (0.06%) topical gel Apply 1 application topically See admin instructions.  0  . etonogestrel-ethinyl estradiol (NUVARING) 0.12-0.015 MG/24HR vaginal ring Place 1 each vaginally every 28 (twenty-eight) days. Insert vaginally and leave in place for 3 consecutive weeks, then remove for 1 week.     . famotidine (PEPCID) 20 MG tablet Take 1 tablet (20 mg total) by mouth as needed for heartburn or indigestion. 30 tablet 3  . ibuprofen (ADVIL,MOTRIN) 800 MG tablet Take 800 mg by mouth every 6 (six) hours as needed. for pain  3  . Insulin Pen Needle (BD PEN NEEDLE NANO 2ND GEN) 32G X 4 MM MISC 1 Package by Does not apply route 2 (two) times daily. 100 each 0  . linaclotide (LINZESS) 72 MCG capsule Take 1 capsule (72 mcg total) by mouth daily before breakfast. 30 capsule 3  . Liraglutide -Weight Management (SAXENDA) 18 MG/3ML SOPN Inject 3 mg into the skin daily. 5 pen 0  . metFORMIN (GLUCOPHAGE) 500 MG tablet Take 1 tablet (500 mg total) by mouth daily with breakfast. 30 tablet 0  . Multiple Vitamin (MULTIVITAMIN) LIQD Take 5 mLs by mouth daily.    . ondansetron (ZOFRAN ODT) 4 MG disintegrating tablet 4mg  ODT q4 hours prn nausea/vomit 10 tablet 0  . promethazine (PHENERGAN) 25 MG tablet Take 1 tablet (25 mg total) by mouth every 8 (eight) hours as needed for nausea or vomiting. 20 tablet 0  . RESTASIS MULTIDOSE 0.05 % ophthalmic emulsion Place 1 drop into both eyes as needed.  3  . rizatriptan (MAXALT-MLT) 10 MG disintegrating tablet TAKE 1 TABLET (10 MG TOTAL) BY MOUTH AS NEEDED FOR MIGRAINE. MAY REPEAT IN 2 HOURS IF NEEDED 9 tablet 11  . Ubrogepant ([name]) 50 MG TABS Take 50 mg by mouth 2 (two) times daily as needed (1 pill at onset of migraine, may repeat in 2 hours. no more than 4 pills a day). 10 tablet 3   No current  facility-administered medications on file prior to visit.     PAST MEDICAL HISTORY: Past Medical History:  Diagnosis Date  . Allergic rhinitis   . Asthma   . Chronic migraine    Dr. Catalina Gravel  . Constipation   . Dry eye syndrome of both lacrimal glands   . Dry skin   . Endometriosis   . Fatigue   . Gastroparesis   . GERD (gastroesophageal reflux disease)   . Headache(784.0)    occasional, dx w/ Migraines before, Topamax helps  . Heartburn   . History of stomach ulcers   . Lactose intolerance   . Localized edema   . Migraine   . Muscle weakness (generalized)   . Nuclear cataract of both eyes    Mild  . Overweight   . Pain in right ankle and joints  of right foot   . Shortness of breath   . Sinus complaint   . Sinusitis   . Tired   . TMJ pain dysfunction syndrome    occasional    PAST SURGICAL HISTORY: Past Surgical History:  Procedure Laterality Date  . ANKLE SURGERY Left 11/16/2017  . AUGMENTATION MAMMAPLASTY Bilateral 2006  . BREAST ENHANCEMENT SURGERY  2006  . BUNIONECTOMY    . COLONOSCOPY    . endrometroisis    . fallopian tube removed     Left  . UPPER GASTROINTESTINAL ENDOSCOPY    . wisdoim teeth extraction      SOCIAL HISTORY: Social History   Tobacco Use  . Smoking status: Never Smoker  . Smokeless tobacco: Never Used  Substance Use Topics  . Alcohol use: Yes    Alcohol/week: 0.0 standard drinks    Comment: socially - occasional   . Drug use: No    FAMILY HISTORY: Family History  Problem Relation Age of Onset  . Throat cancer Mother   . Cancer - Other Mother 55       throat - died 85 months  . Cancer Mother   . Alcoholism Mother   . Heart disease Father   . Cancer Father   . Breast cancer Maternal Aunt        breast  . Colon cancer Maternal Uncle 7       died 25  . Breast cancer Maternal Aunt        breast  . Pancreatic cancer Maternal Aunt   . Diabetes Other        grandmother  . Hypertension Maternal Aunt        several family  members  . Throat cancer Maternal Uncle   . Asthma Other        cousin, maternal  . Breast cancer Maternal Aunt        total of 5 aunts  . Breast cancer Maternal Aunt   . Colon cancer Paternal Grandfather   . Heart attack Neg Hx   . Rectal cancer Neg Hx   . Stomach cancer Neg Hx     ROS: Review of Systems  Gastrointestinal: Positive for nausea. Negative for vomiting.  Musculoskeletal:       Negative for muscle weakness.  Neurological: Negative for headaches.       Positive for migraines.    PHYSICAL EXAM: Pt in no acute distress  RECENT LABS AND TESTS: BMET    Component Value Date/Time   NA 135 09/21/2018 2241   NA 140 05/06/2018 1421   K 3.6 09/21/2018 2241   CL 101 09/21/2018 2241   CO2 26 09/21/2018 2241   GLUCOSE 87 09/21/2018 2241   BUN 12 09/21/2018 2241   BUN 14 05/06/2018 1421   CREATININE 0.72 09/21/2018 2241   CALCIUM 8.9 09/21/2018 2241   GFRNONAA >60 09/21/2018 2241   GFRAA >60 09/21/2018 2241   Lab Results  Component Value Date   HGBA1C 5.9 (H) 05/06/2018   HGBA1C 5.8 (H) 01/23/2018   HGBA1C 5.5 08/14/2017   HGBA1C 5.6 06/09/2015   HGBA1C 5.7 (H) 05/22/2014   Lab Results  Component Value Date   INSULIN 11.2 05/06/2018   INSULIN 8.4 01/23/2018   INSULIN 8.0 08/14/2017   CBC    Component Value Date/Time   WBC 12.4 (H) 09/21/2018 2241   RBC 4.54 09/21/2018 2241   HGB 13.1 09/21/2018 2241   HGB 12.2 01/23/2018 1155   HCT 40.8 09/21/2018 2241  HCT 37.0 01/23/2018 1155   PLT 357 09/21/2018 2241   MCV 89.9 09/21/2018 2241   MCV 88 01/23/2018 1155   MCH 28.9 09/21/2018 2241   MCHC 32.1 09/21/2018 2241   RDW 14.0 09/21/2018 2241   RDW 15.4 01/23/2018 1155   LYMPHSABS 2.4 09/21/2018 2241   LYMPHSABS 1.9 01/23/2018 1155   MONOABS 0.8 09/21/2018 2241   EOSABS 0.1 09/21/2018 2241   EOSABS 0.1 01/23/2018 1155   BASOSABS 0.1 09/21/2018 2241   BASOSABS 0.0 01/23/2018 1155   Iron/TIBC/Ferritin/ %Sat No results found for: IRON, TIBC,  FERRITIN, IRONPCTSAT Lipid Panel     Component Value Date/Time   CHOL 191 05/06/2018 1421   TRIG 109 05/06/2018 1421   HDL 65 05/06/2018 1421   CHOLHDL 3 05/14/2014 1223   VLDL 12.2 05/14/2014 1223   LDLCALC 104 (H) 05/06/2018 1421   Hepatic Function Panel     Component Value Date/Time   PROT 7.9 09/21/2018 2241   PROT 6.9 05/06/2018 1421   ALBUMIN 3.7 09/21/2018 2241   ALBUMIN 4.1 05/06/2018 1421   AST 20 09/21/2018 2241   ALT 22 09/21/2018 2241   ALKPHOS 68 09/21/2018 2241   BILITOT 0.3 09/21/2018 2241   BILITOT <0.2 05/06/2018 1421   BILIDIR 0.0 05/14/2014 1223      Component Value Date/Time   TSH 0.922 05/06/2018 1421   TSH 2.160 01/23/2018 1155   TSH 0.976 08/14/2017 1152   Results for ANAIA, FRITH A (MRN [id number]) as of 10/09/2018 10:43  Ref. Range 05/06/2018 14:21  Vitamin D, 25-Hydroxy Latest Ref Range: 30.0 - 100.0 ng/mL 34.0     I, Marcille Blanco, CMA, am acting as transcriptionist for Starlyn Skeans, MD I have reviewed the above documentation for accuracy and completeness, and I agree with the above. -Dennard Nip, MD

## 2018-10-09 NOTE — Progress Notes (Signed)
Virtual Visit via Video Note  I connected with Morgan Long on 10/09/18 at  2:40 PM EDT by a video enabled telemedicine application and verified that I am speaking with the correct person using two identifiers.  Location: Patient: home Provider: office   I discussed the limitations of evaluation and management by telemedicine and the availability of in person appointments. The patient expressed understanding and agreed to proceed.  History of Present Illness: Pt is home concerned about getting a yeast infection with doxycycline  She also still has a headache and neuro can not get her in until the end of the month She is wanting to get tested for covid but was told there were no tests still She is just starting to feel better.  But is still coughing and her chest is on fire.     Past Medical History:  Diagnosis Date  . Allergic rhinitis   . Asthma   . Chronic migraine    Dr. Catalina Gravel  . Constipation   . Dry eye syndrome of both lacrimal glands   . Dry skin   . Endometriosis   . Fatigue   . Gastroparesis   . GERD (gastroesophageal reflux disease)   . Headache(784.0)    occasional, dx w/ Migraines before, Topamax helps  . Heartburn   . History of stomach ulcers   . Lactose intolerance   . Localized edema   . Migraine   . Muscle weakness (generalized)   . Nuclear cataract of both eyes    Mild  . Overweight   . Pain in right ankle and joints of right foot   . Shortness of breath   . Sinus complaint   . Sinusitis   . Tired   . TMJ pain dysfunction syndrome    occasional   Current Outpatient Medications on File Prior to Visit  Medication Sig Dispense Refill  . AMBULATORY NON FORMULARY MEDICATION Medication Name: Domperidone 10 mg three times daily before meals and at bedtime 800 tablet 1  . beclomethasone (QVAR REDIHALER) 80 MCG/ACT inhaler Inhale 2 puffs 2 (two) times daily into the lungs. 10.6 g 5  . benzonatate (TESSALON) 200 MG capsule Take 1 capsule (200 mg total) by  mouth 2 (two) times daily as needed for cough. 20 capsule 0  . budesonide-formoterol (SYMBICORT) 160-4.5 MCG/ACT inhaler Inhale 2 puffs 2 (two) times daily into the lungs. 1 Inhaler 5  . cetirizine (ZYRTEC) 10 MG tablet TAKE 1 TABLET BY MOUTH DAILY 30 tablet 5  . doxycycline (VIBRA-TABS) 100 MG tablet Take 1 tablet (100 mg total) by mouth 2 (two) times daily. 20 tablet 0  . ESTROGEL 0.75 MG/1.25 GM (0.06%) topical gel Apply 1 application topically See admin instructions.  0  . etonogestrel-ethinyl estradiol (NUVARING) 0.12-0.015 MG/24HR vaginal ring Place 1 each vaginally every 28 (twenty-eight) days. Insert vaginally and leave in place for 3 consecutive weeks, then remove for 1 week.     . famotidine (PEPCID) 20 MG tablet Take 1 tablet (20 mg total) by mouth as needed for heartburn or indigestion. 30 tablet 3  . ibuprofen (ADVIL,MOTRIN) 800 MG tablet Take 800 mg by mouth every 6 (six) hours as needed. for pain  3  . Insulin Pen Needle (BD PEN NEEDLE NANO 2ND GEN) 32G X 4 MM MISC 1 Package by Does not apply route 2 (two) times daily. 100 each 0  . linaclotide (LINZESS) 72 MCG capsule Take 1 capsule (72 mcg total) by mouth daily before breakfast. 30 capsule 3  .  Liraglutide -Weight Management (SAXENDA) 18 MG/3ML SOPN Inject 3 mg into the skin daily. 5 pen 0  . metFORMIN (GLUCOPHAGE) 500 MG tablet Take 1 tablet (500 mg total) by mouth daily with breakfast. 30 tablet 0  . Multiple Vitamin (MULTIVITAMIN) LIQD Take 5 mLs by mouth daily.    . ondansetron (ZOFRAN ODT) 4 MG disintegrating tablet 4mg  ODT q4 hours prn nausea/vomit 10 tablet 0  . promethazine (PHENERGAN) 25 MG tablet Take 1 tablet (25 mg total) by mouth every 8 (eight) hours as needed for nausea or vomiting. 20 tablet 0  . RESTASIS MULTIDOSE 0.05 % ophthalmic emulsion Place 1 drop into both eyes as needed.  3  . rizatriptan (MAXALT-MLT) 10 MG disintegrating tablet TAKE 1 TABLET (10 MG TOTAL) BY MOUTH AS NEEDED FOR MIGRAINE. MAY REPEAT IN 2  HOURS IF NEEDED 9 tablet 11  . Ubrogepant ([name]) 50 MG TABS Take 50 mg by mouth 2 (two) times daily as needed (1 pill at onset of migraine, may repeat in 2 hours. no more than 4 pills a day). 10 tablet 3  . Vitamin D, Ergocalciferol, (DRISDOL) 1.25 MG (50000 UT) CAPS capsule Take 1 capsule (50,000 Units total) by mouth every 7 (seven) days. 4 capsule 0   No current facility-administered medications on file prior to visit.    . Observations/Objective: Pt unable to get vitals Pt in NAD  Afebrile   Assessment and Plan: 1. Acute vaginitis Pt concerned about yeast with doxy Diflucan sent to the pharmacy  - fluconazole (DIFLUCAN) 150 MG tablet; 1 po x1, may repeat in 3 days prn  Dispense: 2 tablet; Refill: 0  2. Chronic migraine without aura without status migrainosus, not intractable  Pt will come into office tomorrow for nurse visit for toradol 60 mg IM She is on cancellation list with her neuro   Follow Up Instructions:    I discussed the assessment and treatment plan with the patient. The patient was provided an opportunity to ask questions and all were answered. The patient agreed with the plan and demonstrated an understanding of the instructions.   The patient was advised to call back or seek an in-person evaluation if the symptoms worsen or if the condition fails to improve as anticipated.  I provided 15 minutes of non-face-to-face time during this encounter.   [name], DO

## 2018-10-09 NOTE — Assessment & Plan Note (Signed)
Pt will come into office tomorrow for nurse visit for toradol 60 mg IM She is on cancellation list with her neuro

## 2018-10-10 ENCOUNTER — Other Ambulatory Visit: Payer: Self-pay

## 2018-10-10 ENCOUNTER — Ambulatory Visit (INDEPENDENT_AMBULATORY_CARE_PROVIDER_SITE_OTHER): Payer: BLUE CROSS/BLUE SHIELD

## 2018-10-10 ENCOUNTER — Encounter: Payer: Self-pay | Admitting: Family Medicine

## 2018-10-10 DIAGNOSIS — G43709 Chronic migraine without aura, not intractable, without status migrainosus: Secondary | ICD-10-CM | POA: Diagnosis not present

## 2018-10-10 MED ORDER — KETOROLAC TROMETHAMINE 60 MG/2ML IM SOLN
60.0000 mg | Freq: Once | INTRAMUSCULAR | Status: AC
  Administered 2018-10-10: 60 mg via INTRAMUSCULAR

## 2018-10-11 DIAGNOSIS — Z20828 Contact with and (suspected) exposure to other viral communicable diseases: Secondary | ICD-10-CM | POA: Diagnosis not present

## 2018-10-14 ENCOUNTER — Ambulatory Visit (INDEPENDENT_AMBULATORY_CARE_PROVIDER_SITE_OTHER): Payer: BLUE CROSS/BLUE SHIELD | Admitting: Psychology

## 2018-10-15 ENCOUNTER — Encounter: Payer: Self-pay | Admitting: Family Medicine

## 2018-10-17 NOTE — Progress Notes (Signed)
Yvonne R Lowne Chase, DO 

## 2018-10-21 ENCOUNTER — Other Ambulatory Visit (INDEPENDENT_AMBULATORY_CARE_PROVIDER_SITE_OTHER): Payer: Self-pay | Admitting: Family Medicine

## 2018-10-21 ENCOUNTER — Encounter: Payer: Self-pay | Admitting: Family Medicine

## 2018-10-21 DIAGNOSIS — R7303 Prediabetes: Secondary | ICD-10-CM

## 2018-10-21 NOTE — Telephone Encounter (Signed)
Any fevers?  If yes she would have to go to ER Otherwise ok to put cxr in      Dx sob  If very sob on phone I would advise er  Let her know about drive through testing at [facility] also -- she could call and maybe a candidate to get tested

## 2018-10-21 NOTE — Telephone Encounter (Signed)
We need to wait for covid test before cxr unless she is bad enough to go to ER--- then they will do it

## 2018-10-21 NOTE — Telephone Encounter (Signed)
Patient had covid testing done over 5 days ago and has not got test results back.  Do you want to wait to get CXR?   She only has sob when like carrying grocery bags.  She sounded fine over phone.   Also she states that she has finished up doxy and wanted to know if she needed to be put on another antibiotic.

## 2018-10-22 ENCOUNTER — Encounter: Payer: Self-pay | Admitting: Family Medicine

## 2018-10-22 NOTE — Telephone Encounter (Signed)
Patient notified that she will have to wait.

## 2018-10-23 ENCOUNTER — Other Ambulatory Visit: Payer: Self-pay

## 2018-10-23 ENCOUNTER — Ambulatory Visit (INDEPENDENT_AMBULATORY_CARE_PROVIDER_SITE_OTHER): Payer: BLUE CROSS/BLUE SHIELD | Admitting: Family Medicine

## 2018-10-23 ENCOUNTER — Encounter (INDEPENDENT_AMBULATORY_CARE_PROVIDER_SITE_OTHER): Payer: Self-pay | Admitting: Family Medicine

## 2018-10-23 DIAGNOSIS — E559 Vitamin D deficiency, unspecified: Secondary | ICD-10-CM

## 2018-10-23 DIAGNOSIS — Z6835 Body mass index (BMI) 35.0-35.9, adult: Secondary | ICD-10-CM | POA: Diagnosis not present

## 2018-10-23 MED ORDER — VITAMIN D (ERGOCALCIFEROL) 1.25 MG (50000 UNIT) PO CAPS: 50000.0000 [IU] | ORAL_CAPSULE | ORAL | 0 refills | Status: DC

## 2018-10-23 NOTE — Telephone Encounter (Signed)
There are a lot of viruses that can cause her symptoms A reliable antibody test is not yet available We can get a cxr now if she is still coughing

## 2018-10-23 NOTE — Progress Notes (Signed)
Office: [phone]  /  Fax: [phone] TeleHealth Visit:  Morgan Long has verbally consented to this TeleHealth visit today. The patient is located at home, the provider is located at the [employer] [facility] office. The participants in this visit include the listed provider and patient . The visit was conducted today via [email or web address].  HPI:   Chief Complaint: OBESITY Morgan Long is here to discuss her progress with her obesity treatment plan. She is on the  keep a food journal with 1400 calories and 80+g of protein  and is following her eating plan approximately 70 % of the time. She states she is exercising by doing sit-ups, squats, and home exercises for 30-40 minutes 3 times per week. Morgan Long feels she has lost about 5 lbs in the last 2-3 weeks but this in part has been due to not eating much while recovering from an asthma exacerbation. She is feeling better now and starting to get back on track .  We were unable to weigh the patient today for this TeleHealth visit. She feels as if she has lost weight since her last visit. She has lost 5 lbs since starting treatment with [name].  Vitamin D deficiency Morgan Long has a diagnosis of vitamin D deficiency. She is currently taking vit D and denies nausea, vomiting or muscle weakness. Her vitamin D level is not at goal yet.   ASSESSMENT AND PLAN:  Vitamin D deficiency  Other depression - with emotional eating - Plan: Topiramate ER (TROKENDI XR) 50 MG CP24  Class 1 obesity with serious comorbidity and body mass index (BMI) of 31.0 to 31.9 in adult, unspecified obesity type  PLAN: Vitamin D Deficiency Morgan Long was informed that low vitamin D levels contributes to fatigue and are associated with obesity, breast, and colon cancer. She agrees to continue to take prescription Vit D @50 ,000 IU every week #4 with no refills and will follow up for routine testing of vitamin D, at least 2-3 times per year. She was informed of the risk of over-replacement  of vitamin D and agrees to not increase her dose unless she discusses this with [name] first. Agrees to follow up with our clinic as directed.   Obesity Morgan Long is currently in the action stage of change. As such, her goal is to continue with weight loss efforts She has agreed to keep a food journal with 1300-1400 calories and 80+g of  protein  Morgan Long has been instructed to work up to a goal of 150 minutes of combined cardio and strengthening exercise per week for weight loss and overall health benefits. We discussed the following Behavioral Modification Stratagies today: work on meal planning and easy cooking plans and no skipping meals.    Morgan Long has agreed to follow up with our clinic in 2 weeks. She was informed of the importance of frequent follow up visits to maximize her success with intensive lifestyle modifications for her multiple health conditions.  ALLERGIES: Allergies  Allergen Reactions  . Bee Venom Anaphylaxis  . Other Anaphylaxis, Swelling and Other (See Comments)    Tree nuts  . Sulfa Antibiotics Nausea Only and Other (See Comments)    dizziness    MEDICATIONS: Current Outpatient Medications on File Prior to Visit  Medication Sig Dispense Refill  . acetaminophen (TYLENOL) 500 MG tablet Take 500 mg by mouth every 6 (six) hours as needed.    . ALPRAZolam (XANAX) 0.25 MG tablet Take 1 tablet (0.25 mg total) by mouth daily as needed for anxiety. [place]  tablet 0  . butalbital-acetaminophen-caffeine (FIORICET, ESGIC) 50-325-40 MG tablet Take 1 tablet by mouth daily as needed for headache or migraine. 30 tablet 0  . Calcium Carbonate Antacid (ALKA-SELTZER ANTACID PO) Take by mouth.    . cetirizine (ZYRTEC) 10 MG tablet Take 10 mg by mouth daily.    Marland Kitchen ibuprofen (ADVIL,MOTRIN) 200 MG tablet Take 200 mg by mouth every 6 (six) hours as needed.    . Menthol, Topical Analgesic, (BIOFREEZE EX) Apply 1 application topically as needed (for pain).    . methocarbamol (ROBAXIN) 500 MG tablet  Take 1 tablet (500 mg total) by mouth 3 (three) times daily. 90 tablet 3  . pantoprazole (PROTONIX) 40 MG tablet Take 1 tablet (40 mg total) by mouth daily. Annual appt due in January must see provider for future refills 90 tablet 0  . Prenatal Vit-Fe Fumarate-FA (PRENATAL MULTIVITAMIN) TABS tablet Take 1 tablet by mouth daily at 12 noon.    . valACYclovir (VALTREX) 1000 MG tablet Take 1,000 mg by mouth 2 (two) times daily as needed (for cold sores).   3  . Vitamin D, Ergocalciferol, (DRISDOL) 1.25 MG (50000 UT) CAPS capsule Take 1 capsule (50,000 Units total) by mouth every 7 (seven) days. 4 capsule 0   No current facility-administered medications on file prior to visit.     PAST MEDICAL HISTORY: Past Medical History:  Diagnosis Date  . Allergy   . Anxiety   . GERD (gastroesophageal reflux disease)   . History of stomach ulcers   . Lower back pain   . Lower extremity edema   . Migraines   . Neck pain   . Obesity   . Right hip pain   . Seasonal allergies   . Shoulder pain   . Swallowing difficulty     PAST SURGICAL HISTORY: Past Surgical History:  Procedure Laterality Date  . CESAREAN SECTION N/A 08/07/2015   Procedure: CESAREAN SECTION;  Surgeon: Servando Salina, MD;  Location: [facility];  Service: Obstetrics;  Laterality: N/A;  . ESOPHAGOGASTRODUODENOSCOPY (EGD) WITH PROPOFOL N/A 07/27/2016   Procedure: ESOPHAGOGASTRODUODENOSCOPY (EGD) WITH PROPOFOL;  Surgeon: Doran Stabler, MD;  Location: [facility];  Service: Gastroenterology;  Laterality: N/A;  . WISDOM TOOTH EXTRACTION     2    SOCIAL HISTORY: Social History   Tobacco Use  . Smoking status: Never Smoker  . Smokeless tobacco: Never Used  Substance Use Topics  . Alcohol use: Yes    Comment: social  . Drug use: No    FAMILY HISTORY: Family History  Problem Relation Age of Onset  . Depression Mother   . Anxiety disorder Mother   . Bipolar disorder Mother   . Drug abuse Mother   . Obesity Mother   . High  blood pressure Father   . Hyperlipidemia Maternal Grandmother   . Liver cancer Paternal Grandfather   . Lymphoma Other        great grandmother  . Colon cancer Neg Hx   . Stomach cancer Neg Hx   . Rectal cancer Neg Hx   . Esophageal cancer Neg Hx     ROS: Review of Systems  Gastrointestinal: Negative for nausea and vomiting.  Musculoskeletal:       Negative for muscle weakness    PHYSICAL EXAM: Pt in no acute distress  RECENT LABS AND TESTS: BMET    Component Value Date/Time   NA 141 07/23/2018 0928   K 4.3 07/23/2018 0928   CL 103 07/23/2018 7619  CO2 24 07/23/2018 0928   GLUCOSE 74 07/23/2018 0928   GLUCOSE 90 07/02/2018 0822   BUN 9 07/23/2018 0928   CREATININE 0.74 07/23/2018 0928   CALCIUM 9.7 07/23/2018 0928   GFRNONAA 108 07/23/2018 0928   GFRAA 125 07/23/2018 0928   Lab Results  Component Value Date   HGBA1C 5.1 07/23/2018   Lab Results  Component Value Date   INSULIN 6.7 07/23/2018   CBC    Component Value Date/Time   WBC 6.2 07/02/2018 0822   RBC 4.94 07/02/2018 0822   HGB 14.4 07/02/2018 0822   HCT 43.1 07/02/2018 0822   PLT 327.0 07/02/2018 0822   MCV 87.3 07/02/2018 0822   MCH 28.3 08/09/2015 0540   MCHC 33.5 07/02/2018 0822   RDW 14.2 07/02/2018 0822   LYMPHSABS 2.8 07/02/2018 0822   MONOABS 0.3 07/02/2018 0822   EOSABS 0.5 07/02/2018 0822   BASOSABS 0.0 07/02/2018 0822   Iron/TIBC/Ferritin/ %Sat No results found for: IRON, TIBC, FERRITIN, IRONPCTSAT Lipid Panel     Component Value Date/Time   CHOL 133 07/02/2018 0822   TRIG 63.0 07/02/2018 0822   HDL 51.00 07/02/2018 0822   CHOLHDL 3 07/02/2018 0822   VLDL 12.6 07/02/2018 0822   LDLCALC 70 07/02/2018 0822   Hepatic Function Panel     Component Value Date/Time   PROT 7.0 07/23/2018 0928   ALBUMIN 4.5 07/23/2018 0928   AST 18 07/23/2018 0928   ALT 25 07/23/2018 0928   ALKPHOS 59 07/23/2018 0928   BILITOT 0.4 07/23/2018 0928      Component Value Date/Time   TSH 1.63  07/02/2018 0822   TSH 2.80 12/13/2017 1348   TSH 2.27 06/26/2017 1144    Ref. Range 05/06/2018 14:21  Vitamin D, 25-Hydroxy Latest Ref Range: 30.0 - 100.0 ng/mL 34.0     I, Renee Ramus, am acting as Location manager for Dennard Nip, MD  I have reviewed the above documentation for accuracy and completeness, and I agree with the above. -Dennard Nip, MD

## 2018-10-24 ENCOUNTER — Telehealth: Payer: Self-pay | Admitting: Neurology

## 2018-10-24 ENCOUNTER — Ambulatory Visit (INDEPENDENT_AMBULATORY_CARE_PROVIDER_SITE_OTHER): Payer: BLUE CROSS/BLUE SHIELD | Admitting: Neurology

## 2018-10-24 ENCOUNTER — Encounter: Payer: Self-pay | Admitting: Neurology

## 2018-10-24 ENCOUNTER — Other Ambulatory Visit: Payer: Self-pay

## 2018-10-24 VITALS — BP 114/72 | HR 95 | Temp 97.8°F

## 2018-10-24 DIAGNOSIS — G43719 Chronic migraine without aura, intractable, without status migrainosus: Secondary | ICD-10-CM | POA: Diagnosis not present

## 2018-10-24 MED ORDER — ONABOTULINUMTOXINA 100 UNITS IJ SOLR
200.0000 [IU] | Freq: Once | INTRAMUSCULAR | Status: AC
  Administered 2018-10-24: 17:00:00 200 [IU] via INTRAMUSCULAR

## 2018-10-24 NOTE — Patient Instructions (Signed)
Please remember, botulinum toxin takes about 3-7 days to kick in. As discussed, this is not a pain shot. The purpose of the injections is to gradually improve your symptoms. In some patients it takes up to 2-3 weeks to make a difference and it wears off with time. Sometimes it may wear off before it is time for the next injection. We still should wait till the next 3 monthly injection, because injecting too frequently may cause you to develop immunity to the botulinum toxin. We are looking for a reduction in the severity and/or frequency of your symptoms. As a reminder, side effects to look out for are (but not limited to): mouth dryness, dryness of the eyes, heaviness of your head or muscle weakness, including droopy face or droopy eyelid(s), rarely: speech or swallowing difficulties and very rarely: breathing difficulties. Some people have transient neck pain or soreness which typically responds to over-the-counter anti-inflammatory medication and local heat application with a heat pad. If you think you have a severe reaction to the botulinum toxin, such as weakness, trouble speaking, trouble breathing, or trouble swallowing, you have to call 911 or have someone take you to the nearest emergency room. However, most people have either no or minimal side effects from the injections. It is normal to have a little bit of redness and swelling around the injection sites which usually improves after a few hours. Rarely, there may be a bruise that improves on its own. Most side effects reported are very mild and resolve within 10-14 days. Please feel free to call us if you have any additional questions or concerns: [phone] or email us through My Chart. We may have to adjust the dose over time, depending on your results from this injection and your overall response over time to this medication.   

## 2018-10-24 NOTE — Telephone Encounter (Signed)
3 mo Botox inj // Morgan Long

## 2018-10-24 NOTE — Progress Notes (Signed)
Morgan Long is a 48 year old right-handed woman with an underlying medical history of allergic rhinitis, endometriosis, TMJ problems, and neck pain, as well as obesity, who presents for her 5th botulinum toxin injections for her chronic intractable migraine headaches. The patient is unaccompanied today. I last saw her on 07/17/2018 for her fifth injection, at which time she reported Botox had been helpful, she reported overall very good results, no side effects, her migraines had reduced by at least 7 days a month, she also reported reduction by at least 100 hours/month of her migraine headaches.  She did report that she had received cosmetic Botox injection in her eyebrow area.     Today, 10/24/18: She reports That the last 2 weeks she clearly noticed a flareup of her migraines.  She tried the new medication once or so (the [place]), but it did not help that much but she has it available now.  She was supposed to have her injection for repeat Botox about 9 days ago and she can clearly tell that it has worn off.    Written informed consent for recurrent, 3 monthly intramuscular injections with botulinum toxin for this indication has previously been obtained and scanned into the patient's electronic chart. I will re-consent if the type of botulinum toxin used or the indication for injection changes for this patient in the future. The patient is informed that we will use the same consent for her recurrent, most likely 3 monthly injections. She demonstrated understanding and voiced agreement.   I have previously talked to the patient in detail about expectations, limitations, benefits as well as potential adverse effects of botulinum toxin injections. The patient understands that the side effects include (but are not limited to): Mouth dryness, dryness of eyes, speech and swallowing difficulties, respiratory depression or problems breathing, weakness of muscles including more distant muscles than the ones  injected, flu-like symptoms, myalgias, injection site reactions such as redness, itching, swelling, pain, and infection.   200 units of botulinum toxin type A were reconstituted using preservative-free normal saline to a concentration of 10 units per 0.1 mL and drawn up into 1 mL tuberculin syringes. Lot number was [id number], expiration date Nov 2022 for both 100 unit vials, buy and bill.    O/E: BP 114/72 (BP Location: Left Arm, Patient Position: Sitting, Cuff Size: Normal)   Pulse 95   Temp 97.8 F (36.6 C) (Oral)       The patient was situated in a chair, sitting comfortably. After preparing the areas with 70% isopropyl alcohol and using a 26 gauge 1 1/2 inch hollow lumen recording EMG needle for the neck injections as well as a 30 gauge 1 inch needle for the facial injections, a total dose of 155 units of botulinum toxin type A in the form of Botox was injected into the muscles and the following distribution and quantities:   #1: 10 units on the right and 10 units in the left frontalis muscles, broken down in 2 sites on each side. #2: 5 units in the right and 5 units in the left corrugator muscles. #3: 15 units in the right and 15 units in the left occipitalis muscles, broken down in 3 sites on each side. #4: 20 units in the right and 20 units in the left temporalis muscles, broken down in 4 sites on each side.  #5: 15 units on the right and 15 units in the left upper trapezius muscles, broken down in 3 sites on each side. #6: 10 units  in the right and 10 units in the left splenius capitis muscles, broken down in 2 sites on each side.  #7: 2.5 units in the right and 2.5 units in the left procerus muscles.   EMG guidance was utilized for the neck injections with mild EMG activity noted, especially in the splenius capitis muscles, more so on the R.    A dose of 45 units out of a total dose of 200 units was discarded as unavoidable waste.    The patient tolerated the procedure well without  immediate complications. She was advised to make a followup appointment for repeat injections in 3 months from now and encouraged to call us with any interim questions, concerns, problems, or updates. She was in agreement and did not have any questions prior to leaving clinic today.   Previously:   I saw her on 04/10/2018 for her 4th injection, at which time she reported good results with her Botox injections in significant reduction in her headache frequency and severely overall. She denied any side effects. She was overdue by nearly 2 weeks for her injection at the time.   I saw her on 12/26/2017 for her 3rd injection, at which time she reported having had a recent increase in her headache frequency, she had recent foot surgery.   I saw her on 09/25/2017 for her second round of injections. She felt that in the week prior to the injection her headaches were more intense.    I saw her on 06/28/2017 for her initial Botox injection, at which time she received 155 units of Botox.    She was seen by her primary care physician and received a Toradol injection on 06/19/2017 as well as a prescription for Relpax.    04/25/17: Dr. Melton Alar retired. She reports a long-standing history of migraine headaches, she recalls she was about 48 years old when she started having migraines. For quite some time she had spontaneous improvement in her migraines but some 4 or 5 years ago started having more frequent migraines. She has tried and failed multiple preventative medications but does not have a list with her and prior records from Dr. Audery Amel office are not available today. She reports that his office is closed at this point. She did not get her records from his office. Perhaps her primary care provider has records. I encouraged her to try to obtain those records as it will help [name] manage her migraines optimally. She recalls having tried Topamax. She had hair loss from some medications but is not completely sure which  ones. Topamax did not help, Zonegran caused her sedation, nortriptyline was also tried but she is unsure what happened with it. Amitriptyline she may have tried as well. She did not try Depakote. She is worried about sedation side effects as she also is going to school and has to be alert throughout the day. She takes occasional nausea medicine which likely is Zofran under the tongue. She has tried Maxalt under the tongue which is helpful at times. She needs a refill on that. She has regular eye exams because of her history of dry eyes. She used to wear a single contact but was advised to stop using it.  She has had 2 rounds of Botox injections, last time about 3 months ago as she recalls. She remembers doing okay with it. Prior to starting Botox injections she reports having about 18-20 headache days per month typically. She does not always have a typical aura but sometimes has visual  blurriness and bringing in her ears to warn her about an upcoming migraine headache. She has occasional nausea that precedes the headache.   I saw her on 06/09/2015 at which time she was referred is a new patient referral for a new problem, referred by her optometrist at the time for 1 month history of blurry vision. Her exam at the time was nonfocal and reassuringly she had no significant eye related findings. I suggested further workup in the form of visual evoked potentials, blood work, and she also reported a 6 month history of feeling tired. She had no one-sided weakness, tingling or numbness. Symptoms from the past which included paresthesias had resolved completely. She was under the care of Dr. Melton Alar for migraines. She was in the process of titrating Zonegran. She was on 225 mg each night. When she took 300 mg each night she felt too sleepy during the day. She denied any symptoms of sleep disordered breathing. She did admit not being a good sleeper. She did endorse stress as she was in school online for psychology and was  also working off-and-on in Personal assistant. She reported not drinking sodas, not drinking alcohol and she reported not smoking. She did report pain with eye movements and dry eyes.  She reported no family history of multiple sclerosis or lupus. She denied joint pain with the exception of bilateral knee pain and she also reported a 50 pound weight gain in the last year. She had visual evoked potentials on 07/06/2015: Impression: The visual evoked response test above was within normal limits bilaterally. No evidence of conduction slowing was seen within the anterior visual pathways on either side on today's evaluation.   We called her with her test results. Labs from 06/09/2015 showed normal A1c, normal vitamin D level, normal ANA, normal RF, normal ESR. CRP was elevated at 15.6. 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   She had a brain MRI with and without contrast on 06/23/2015: IMPRESSION:  This is a normal MRI of the brain with and without contrast  In addition, personally reviewed the images through the PACS system. We called her with her test results.   I first met her on 05/22/2014 at the request of her neurosurgeon, Dr. Kathyrn Sheriff, at which time the patient reported intermittent right arm numbness, particularly with neck position changes. I suggested blood work and EMG and nerve conduction testing of the right upper extremity. Her blood work showed elevated B12 and B6 levels, indicative of B vitamin supplementation. Hemoglobin A1c was 5.7. We called her with her test results. She had EMG and nerve conduction testing on 06/02/2014: IMPRESSION:   Nerve conduction studies done on both upper extremities were unremarkable, without evidence of a neuropathy seen. EMG evaluation of the right upper extremity was unremarkable, without evidence of an  overlying cervical radiculopathy. We called her with her test results. At the time, she reported improved symptoms.   05/22/2014: She has intermittent right arm numbness. Her symptoms have been going on for about 6 months. She has not noted any permanent numbness and no issues elsewhere. She does not have any significant weakness and sometimes feels weak when the numbness seems to come on. It goes away if she changes positions or adjusts her neck position. She has had right shoulder problems and pain in the right shoulder.  You saw her on 05/14/2014 for neck pain. She had undergone physical therapy without improvement of her neck pain. She had cervical epidural steroid injections which helped for about 24 hours as understand.    She had a C-spine MRI without contrast on 01/07/2014: Mild cervical spondylosis as described above without significant disc protrusion, foraminal stenosis or central canal stenosis.    Blood work from 05/14/2014 was reviewed: She had a BMP, CBC with differential, liver function panel, lipid panel and TSH all of which were fine with the exception of a borderline LDL of 111.

## 2018-10-29 NOTE — Telephone Encounter (Signed)
I called to schedule the patient but she did not answer so I left a VM asking her to call back. DW  

## 2018-11-07 ENCOUNTER — Ambulatory Visit (INDEPENDENT_AMBULATORY_CARE_PROVIDER_SITE_OTHER): Payer: BC Managed Care – PPO | Admitting: Family Medicine

## 2018-11-07 ENCOUNTER — Other Ambulatory Visit: Payer: Self-pay

## 2018-11-07 ENCOUNTER — Encounter (INDEPENDENT_AMBULATORY_CARE_PROVIDER_SITE_OTHER): Payer: Self-pay | Admitting: Family Medicine

## 2018-11-07 DIAGNOSIS — R7303 Prediabetes: Secondary | ICD-10-CM

## 2018-11-07 DIAGNOSIS — Z6835 Body mass index (BMI) 35.0-35.9, adult: Secondary | ICD-10-CM

## 2018-11-07 NOTE — Progress Notes (Signed)
Office: [phone]  /  Fax: [phone] TeleHealth Visit:  Morgan Long has verbally consented to this TeleHealth visit today. The patient is located at home, the provider is located at the [facility] office. The participants in this visit include the listed provider and patient. The visit was conducted today via [email or web address].  HPI:   Chief Complaint: OBESITY Morgan Long is here to discuss her progress with her obesity treatment plan. She is keeping a food journal with 1300 to 1400 calories and 80 grams of protein and is following her eating plan approximately 70 % of the time. She states she is doing stretches, sit ups, and walking up and down stairs 30 to 45 minutes 3 times per week.Walsie's birthday was last week and she feels that she may have gained some weight. She is getting back on track with journaling and exercise. She had stopped her Saxenda due to GI upset after increasing her dose too rapidly, but she would like to restart.  We were unable to weigh the patient today for this TeleHealth visit. She feels as if she has gained weight since her last visit. She has lost 2 lbs since starting treatment with [name].  Pre-Diabetes Morgan Long has a diagnosis of pre-diabetes based on her elevated Hgb A1c and was informed this puts her at greater risk of developing diabetes. She is stable on her diet, but had GI upset with Saxenda and metformin in the past. Polyphagia has been a bit of a problem. She continues to work on diet and exercise to decrease risk of diabetes. She denies nausea, vomiting, or hypoglycemia.   ASSESSMENT AND PLAN:  Prediabetes  Class 2 severe obesity with serious comorbidity and body mass index (BMI) of 35.0 to 35.9 in adult, unspecified obesity type Morgan Long)  PLAN:  Pre-Diabetes Morgan Long will continue to work on weight loss, exercise, and decreasing simple carbohydrates in her diet to help decrease the risk of diabetes. She was informed that eating too many simple  carbohydrates or too many calories at one sitting increases the likelihood of GI side effects. Morgan Long agreed to discontinue metformin and to restart Saxenda at 0.6 mg qd. Morgan Long agreed to follow up with [name] as directed to monitor her progress in 3 weeks.   I spent > than 50% of the 25 minute visit on counseling as documented in the note.  Obesity Morgan Long is currently in the action stage of change. As such, her goal is to continue with weight loss efforts. She has agreed to keep a food journal with 1300 to 1400 calories and 80+ grams of protein.  Morgan Long has been instructed to work up to a goal of 150 minutes of combined cardio and strengthening exercise per week for weight loss and overall health benefits. We discussed the following Behavioral Modification Strategies today: increasing lean protein intake, work on meal planning and easy cooking plans, keeping healthy foods in the home, and ways to avoid boredom eating. We discussed various medication options to help Morgan Long with her weight loss efforts and we both agreed to restart Saxenda at 0.6 mg with no refills needed and to stay on that dose until our next visit.  Morgan Long has agreed to follow up with our clinic in 3 weeks. She was informed of the importance of frequent follow up visits to maximize her success with intensive lifestyle modifications for her multiple health conditions.  ALLERGIES: Allergies  Allergen Reactions  . Acetaminophen Itching    Patient can take tylenol, just not tylenol#3  .  Hydrocodone Nausea Only  . Oxycodone Itching  . Phosphate Itching    sick  . Codeine Itching and Nausea Only    MEDICATIONS: Current Outpatient Medications on File Prior to Visit  Medication Sig Dispense Refill  . AMBULATORY NON FORMULARY MEDICATION Medication Name: Domperidone 10 mg three times daily before meals and at bedtime 800 tablet 1  . beclomethasone (QVAR REDIHALER) 80 MCG/ACT inhaler Inhale 2 puffs 2 (two) times daily into the lungs. 10.6 g 5   . budesonide-formoterol (SYMBICORT) 160-4.5 MCG/ACT inhaler Inhale 2 puffs 2 (two) times daily into the lungs. 1 Inhaler 5  . cetirizine (ZYRTEC) 10 MG tablet TAKE 1 TABLET BY MOUTH DAILY 30 tablet 5  . ESTROGEL 0.75 MG/1.25 GM (0.06%) topical gel Apply 1 application topically See admin instructions.  0  . etonogestrel-ethinyl estradiol (NUVARING) 0.12-0.015 MG/24HR vaginal ring Place 1 each vaginally every 28 (twenty-eight) days. Insert vaginally and leave in place for 3 consecutive weeks, then remove for 1 week.     . famotidine (PEPCID) 20 MG tablet Take 1 tablet (20 mg total) by mouth as needed for heartburn or indigestion. 30 tablet 3  . ibuprofen (ADVIL,MOTRIN) 800 MG tablet Take 800 mg by mouth every 6 (six) hours as needed. for pain  3  . Insulin Pen Needle (BD PEN NEEDLE NANO 2ND GEN) 32G X 4 MM MISC 1 Package by Does not apply route 2 (two) times daily. 100 each 0  . linaclotide (LINZESS) 72 MCG capsule Take 1 capsule (72 mcg total) by mouth daily before breakfast. 30 capsule 3  . Liraglutide -Weight Management (SAXENDA) 18 MG/3ML SOPN Inject 3 mg into the skin daily. 5 pen 0  . Multiple Vitamin (MULTIVITAMIN) LIQD Take 5 mLs by mouth daily.    . ondansetron (ZOFRAN ODT) 4 MG disintegrating tablet 4mg  ODT q4 hours prn nausea/vomit 10 tablet 0  . promethazine (PHENERGAN) 25 MG tablet Take 1 tablet (25 mg total) by mouth every 8 (eight) hours as needed for nausea or vomiting. 20 tablet 0  . RESTASIS MULTIDOSE 0.05 % ophthalmic emulsion Place 1 drop into both eyes as needed.  3  . rizatriptan (MAXALT-MLT) 10 MG disintegrating tablet TAKE 1 TABLET (10 MG TOTAL) BY MOUTH AS NEEDED FOR MIGRAINE. MAY REPEAT IN 2 HOURS IF NEEDED 9 tablet 11  . Ubrogepant ([name]) 50 MG TABS Take 50 mg by mouth 2 (two) times daily as needed (1 pill at onset of migraine, may repeat in 2 hours. no more than 4 pills a day). 10 tablet 3  . Vitamin D, Ergocalciferol, (DRISDOL) 1.25 MG (50000 UT) CAPS capsule Take 1  capsule (50,000 Units total) by mouth every 7 (seven) days. 4 capsule 0   No current facility-administered medications on file prior to visit.     PAST MEDICAL HISTORY: Past Medical History:  Diagnosis Date  . Allergic rhinitis   . Asthma   . Chronic migraine    Dr. Catalina Gravel  . Constipation   . Dry eye syndrome of both lacrimal glands   . Dry skin   . Endometriosis   . Fatigue   . Gastroparesis   . GERD (gastroesophageal reflux disease)   . Headache(784.0)    occasional, dx w/ Migraines before, Topamax helps  . Heartburn   . History of stomach ulcers   . Lactose intolerance   . Localized edema   . Migraine   . Muscle weakness (generalized)   . Nuclear cataract of both eyes    Mild  .  Overweight   . Pain in right ankle and joints of right foot   . Shortness of breath   . Sinus complaint   . Sinusitis   . Tired   . TMJ pain dysfunction syndrome    occasional    PAST SURGICAL HISTORY: Past Surgical History:  Procedure Laterality Date  . ANKLE SURGERY Left 11/16/2017  . AUGMENTATION MAMMAPLASTY Bilateral 2006  . BREAST ENHANCEMENT SURGERY  2006  . BUNIONECTOMY    . COLONOSCOPY    . endrometroisis    . fallopian tube removed     Left  . UPPER GASTROINTESTINAL ENDOSCOPY    . wisdoim teeth extraction      SOCIAL HISTORY: Social History   Tobacco Use  . Smoking status: Never Smoker  . Smokeless tobacco: Never Used  Substance Use Topics  . Alcohol use: Yes    Alcohol/week: 0.0 standard drinks    Comment: socially - occasional   . Drug use: No    FAMILY HISTORY: Family History  Problem Relation Age of Onset  . Throat cancer Mother   . Cancer - Other Mother 55       throat - died 35 months  . Cancer Mother   . Alcoholism Mother   . Heart disease Father   . Cancer Father   . Breast cancer Maternal Aunt        breast  . Colon cancer Maternal Uncle 88       died 52  . Breast cancer Maternal Aunt        breast  . Pancreatic cancer Maternal Aunt   .  Diabetes Other        grandmother  . Hypertension Maternal Aunt        several family members  . Throat cancer Maternal Uncle   . Asthma Other        cousin, maternal  . Breast cancer Maternal Aunt        total of 5 aunts  . Breast cancer Maternal Aunt   . Colon cancer Paternal Grandfather   . Heart attack Neg Hx   . Rectal cancer Neg Hx   . Stomach cancer Neg Hx     ROS: Review of Systems  Endo/Heme/Allergies:       Positive for polyphagia.    PHYSICAL EXAM: Pt in no acute distress  RECENT LABS AND TESTS: BMET    Component Value Date/Time   NA 135 09/21/2018 2241   NA 140 05/06/2018 1421   K 3.6 09/21/2018 2241   CL 101 09/21/2018 2241   CO2 26 09/21/2018 2241   GLUCOSE 87 09/21/2018 2241   BUN 12 09/21/2018 2241   BUN 14 05/06/2018 1421   CREATININE 0.72 09/21/2018 2241   CALCIUM 8.9 09/21/2018 2241   GFRNONAA >60 09/21/2018 2241   GFRAA >60 09/21/2018 2241   Lab Results  Component Value Date   HGBA1C 5.9 (H) 05/06/2018   HGBA1C 5.8 (H) 01/23/2018   HGBA1C 5.5 08/14/2017   HGBA1C 5.6 06/09/2015   HGBA1C 5.7 (H) 05/22/2014   Lab Results  Component Value Date   INSULIN 11.2 05/06/2018   INSULIN 8.4 01/23/2018   INSULIN 8.0 08/14/2017   CBC    Component Value Date/Time   WBC 12.4 (H) 09/21/2018 2241   RBC 4.54 09/21/2018 2241   HGB 13.1 09/21/2018 2241   HGB 12.2 01/23/2018 1155   HCT 40.8 09/21/2018 2241   HCT 37.0 01/23/2018 1155   PLT 357 09/21/2018 2241   MCV  89.9 09/21/2018 2241   MCV 88 01/23/2018 1155   MCH 28.9 09/21/2018 2241   MCHC 32.1 09/21/2018 2241   RDW 14.0 09/21/2018 2241   RDW 15.4 01/23/2018 1155   LYMPHSABS 2.4 09/21/2018 2241   LYMPHSABS 1.9 01/23/2018 1155   MONOABS 0.8 09/21/2018 2241   EOSABS 0.1 09/21/2018 2241   EOSABS 0.1 01/23/2018 1155   BASOSABS 0.1 09/21/2018 2241   BASOSABS 0.0 01/23/2018 1155   Iron/TIBC/Ferritin/ %Sat No results found for: IRON, TIBC, FERRITIN, IRONPCTSAT Lipid Panel     Component  Value Date/Time   CHOL 191 05/06/2018 1421   TRIG 109 05/06/2018 1421   HDL 65 05/06/2018 1421   CHOLHDL 3 05/14/2014 1223   VLDL 12.2 05/14/2014 1223   LDLCALC 104 (H) 05/06/2018 1421   Hepatic Function Panel     Component Value Date/Time   PROT 7.9 09/21/2018 2241   PROT 6.9 05/06/2018 1421   ALBUMIN 3.7 09/21/2018 2241   ALBUMIN 4.1 05/06/2018 1421   AST 20 09/21/2018 2241   ALT 22 09/21/2018 2241   ALKPHOS 68 09/21/2018 2241   BILITOT 0.3 09/21/2018 2241   BILITOT <0.2 05/06/2018 1421   BILIDIR 0.0 05/14/2014 1223      Component Value Date/Time   TSH 0.922 05/06/2018 1421   TSH 2.160 01/23/2018 1155   TSH 0.976 08/14/2017 1152   Results for MARVELLA, JENNING A (MRN [id number]) as of 11/07/2018 14:05  Ref. Range 05/06/2018 14:21  Vitamin D, 25-Hydroxy Latest Ref Range: 30.0 - 100.0 ng/mL 34.0     I, Marcille Blanco, CMA, am acting as transcriptionist for Starlyn Skeans, MD I have reviewed the above documentation for accuracy and completeness, and I agree with the above. -Dennard Nip, MD

## 2018-11-10 ENCOUNTER — Other Ambulatory Visit (INDEPENDENT_AMBULATORY_CARE_PROVIDER_SITE_OTHER): Payer: Self-pay | Admitting: Family Medicine

## 2018-11-11 ENCOUNTER — Telehealth: Payer: Self-pay | Admitting: Internal Medicine

## 2018-11-11 ENCOUNTER — Encounter: Payer: Self-pay | Admitting: Family Medicine

## 2018-11-11 NOTE — Telephone Encounter (Signed)
Patient would like to know if she can get samples for linaclotide (LINZESS) 72 MCG. She said that her insurance does not cover it nomore

## 2018-11-12 ENCOUNTER — Encounter: Payer: Self-pay | Admitting: Family Medicine

## 2018-11-12 ENCOUNTER — Telehealth: Payer: Self-pay

## 2018-11-12 NOTE — Telephone Encounter (Signed)
Copied from [place] [id number]. Topic: General - Other >> Nov 12, 2018  8:36 AM Leward Quan A wrote: Reason for CRM: Patient called to ask for someone to contact her when the letter is been uploaded to her My Chart at  Ph# [phone]

## 2018-11-12 NOTE — Telephone Encounter (Signed)
PT called checking status, requesting fax as soon as possible. Please advise  Fax: [phone]

## 2018-11-13 ENCOUNTER — Encounter: Payer: Self-pay | Admitting: Family Medicine

## 2018-11-13 NOTE — Telephone Encounter (Signed)
Pt called to check status of letter stating she was told the person handling it would be in at 9:00am. I called [employer] and Kaylyn/Sheketia stated they are between pts and will follow up with Morgan Long as soon as they are able. Msg was relayed to pt with understanding.

## 2018-11-13 NOTE — Telephone Encounter (Signed)
Note faxed to patient and patient notified.

## 2018-11-25 ENCOUNTER — Encounter: Payer: Self-pay | Admitting: Family Medicine

## 2018-11-28 ENCOUNTER — Telehealth (INDEPENDENT_AMBULATORY_CARE_PROVIDER_SITE_OTHER): Payer: BC Managed Care – PPO | Admitting: Family Medicine

## 2018-11-28 ENCOUNTER — Other Ambulatory Visit: Payer: Self-pay

## 2018-11-28 ENCOUNTER — Encounter (INDEPENDENT_AMBULATORY_CARE_PROVIDER_SITE_OTHER): Payer: Self-pay | Admitting: Family Medicine

## 2018-11-28 DIAGNOSIS — Z6835 Body mass index (BMI) 35.0-35.9, adult: Secondary | ICD-10-CM

## 2018-11-28 DIAGNOSIS — E559 Vitamin D deficiency, unspecified: Secondary | ICD-10-CM

## 2018-11-30 MED ORDER — VITAMIN D (ERGOCALCIFEROL) 1.25 MG (50000 UNIT) PO CAPS: 50000.0000 [IU] | ORAL_CAPSULE | ORAL | 0 refills | Status: DC

## 2018-12-02 ENCOUNTER — Ambulatory Visit (INDEPENDENT_AMBULATORY_CARE_PROVIDER_SITE_OTHER): Payer: BC Managed Care – PPO | Admitting: Family Medicine

## 2018-12-02 NOTE — Progress Notes (Signed)
Office: [phone]  /  Fax: [phone] TeleHealth Visit:  Morgan Long has verbally consented to this TeleHealth visit today. The patient is located at home, the provider is located at the [facility] office. The participants in this visit include the listed provider and patient. The visit was conducted today via telephone call (FaceTime failed - changed to telephone call).  HPI:   Chief Complaint: OBESITY Morgan Long is here to discuss her progress with her obesity treatment plan. She is keeping a food journal with 1300-1400 calories and 80+ grams of protein and is following her eating plan approximately 70% of the time. She states she is exercising 0 minutes 0 times per week. Morgan Long is stable on Saxenda and states she is journaling on and off. She feels she has maintained her weight since her last visit. She has increased her Saxenda to 1.2 and notes decreased polyphagia. We were unable to weigh the patient today for this TeleHealth visit. She feels as if she has maintained her weight since her last visit. She has lost 2 lbs since starting treatment with [name].  Vitamin D deficiency Morgan Long has a diagnosis of Vitamin D deficiency, which is not yet at goal. Her last Vitamin D level was reported to be 34.0 on 05/06/2018. She is currently taking prescription Vit D and denies nausea, vomiting or muscle weakness.  ASSESSMENT AND PLAN:  Vitamin D deficiency - Plan: Vitamin D, Ergocalciferol, (DRISDOL) 1.25 MG (50000 UT) CAPS capsule  Class 2 severe obesity with serious comorbidity and body mass index (BMI) of 35.0 to 35.9 in adult, unspecified obesity type ([place])  PLAN:  Vitamin D Deficiency Morgan Long was informed that low Vitamin D levels contributes to fatigue and are associated with obesity, breast, and colon cancer. She agrees to continue to take prescription Vit D @ 50,000 IU every week and will follow-up for routine testing of Vitamin D, at least 2-3 times per year. She was informed of  the risk of over-replacement of Vitamin D and agrees to not increase her dose unless she discusses this with [name] first. Morgan Long agrees to follow-up with our clinic in 2-3 weeks.  I spent > than 50% of the 15 minute visit on counseling as documented in the note.  Obesity Morgan Long is currently in the action stage of change. As such, her goal is to continue with weight loss efforts. She has agreed to keep a food journal with 1300-1400 calories and 75+ grams of protein daily. Morgan Long has been instructed to work up to a goal of 150 minutes of combined cardio and strengthening exercise per week for weight loss and overall health benefits. We discussed the following Behavioral Modification Strategies today: increasing lean protein intake, decreasing simple carbohydrates, work on meal planning and easy cooking plans. Morgan Long will continue Saxenda at 1.2 (no refill needed) and will follow-up with [name] as directed to monitor her progress.  Morgan Long has agreed to follow-up with our clinic in 2-3 weeks. She was informed of the importance of frequent follow-up visits to maximize her success with intensive lifestyle modifications for her multiple health conditions.  ALLERGIES: Allergies  Allergen Reactions  . Acetaminophen Itching    Patient can take tylenol, just not tylenol#3  . Hydrocodone Nausea Only  . Oxycodone Itching  . Phosphate Itching    sick  . Codeine Itching and Nausea Only    MEDICATIONS: Current Outpatient Medications on File Prior to Visit  Medication Sig Dispense Refill  . AMBULATORY NON FORMULARY MEDICATION Medication Name:  Domperidone 10 mg three times daily before meals and at bedtime 800 tablet 1  . beclomethasone (QVAR REDIHALER) 80 MCG/ACT inhaler Inhale 2 puffs 2 (two) times daily into the lungs. 10.6 g 5  . budesonide-formoterol (SYMBICORT) 160-4.5 MCG/ACT inhaler Inhale 2 puffs 2 (two) times daily into the lungs. 1 Inhaler 5  . cetirizine (ZYRTEC) 10 MG tablet TAKE 1 TABLET BY MOUTH DAILY 30  tablet 5  . ESTROGEL 0.75 MG/1.25 GM (0.06%) topical gel Apply 1 application topically See admin instructions.  0  . etonogestrel-ethinyl estradiol (NUVARING) 0.12-0.015 MG/24HR vaginal ring Place 1 each vaginally every 28 (twenty-eight) days. Insert vaginally and leave in place for 3 consecutive weeks, then remove for 1 week.     . famotidine (PEPCID) 20 MG tablet Take 1 tablet (20 mg total) by mouth as needed for heartburn or indigestion. 30 tablet 3  . ibuprofen (ADVIL,MOTRIN) 800 MG tablet Take 800 mg by mouth every 6 (six) hours as needed. for pain  3  . Insulin Pen Needle (BD PEN NEEDLE NANO 2ND GEN) 32G X 4 MM MISC 1 Package by Does not apply route 2 (two) times daily. 100 each 0  . linaclotide (LINZESS) 72 MCG capsule Take 1 capsule (72 mcg total) by mouth daily before breakfast. 30 capsule 3  . Liraglutide -Weight Management (SAXENDA) 18 MG/3ML SOPN Inject 3 mg into the skin daily. 5 pen 0  . Multiple Vitamin (MULTIVITAMIN) LIQD Take 5 mLs by mouth daily.    . ondansetron (ZOFRAN ODT) 4 MG disintegrating tablet 4mg  ODT q4 hours prn nausea/vomit 10 tablet 0  . promethazine (PHENERGAN) 25 MG tablet Take 1 tablet (25 mg total) by mouth every 8 (eight) hours as needed for nausea or vomiting. 20 tablet 0  . RESTASIS MULTIDOSE 0.05 % ophthalmic emulsion Place 1 drop into both eyes as needed.  3  . rizatriptan (MAXALT-MLT) 10 MG disintegrating tablet TAKE 1 TABLET (10 MG TOTAL) BY MOUTH AS NEEDED FOR MIGRAINE. MAY REPEAT IN 2 HOURS IF NEEDED 9 tablet 11  . Ubrogepant ([name]) 50 MG TABS Take 50 mg by mouth 2 (two) times daily as needed (1 pill at onset of migraine, may repeat in 2 hours. no more than 4 pills a day). 10 tablet 3   No current facility-administered medications on file prior to visit.     PAST MEDICAL HISTORY: Past Medical History:  Diagnosis Date  . Allergic rhinitis   . Asthma   . Chronic migraine    Dr. Catalina Gravel  . Constipation   . Dry eye syndrome of both lacrimal glands    . Dry skin   . Endometriosis   . Fatigue   . Gastroparesis   . GERD (gastroesophageal reflux disease)   . Headache(784.0)    occasional, dx w/ Migraines before, Topamax helps  . Heartburn   . History of stomach ulcers   . Lactose intolerance   . Localized edema   . Migraine   . Muscle weakness (generalized)   . Nuclear cataract of both eyes    Mild  . Overweight   . Pain in right ankle and joints of right foot   . Shortness of breath   . Sinus complaint   . Sinusitis   . Tired   . TMJ pain dysfunction syndrome    occasional    PAST SURGICAL HISTORY: Past Surgical History:  Procedure Laterality Date  . ANKLE SURGERY Left 11/16/2017  . AUGMENTATION MAMMAPLASTY Bilateral 2006  . BREAST ENHANCEMENT SURGERY  2006  . BUNIONECTOMY    . COLONOSCOPY    . endrometroisis    . fallopian tube removed     Left  . UPPER GASTROINTESTINAL ENDOSCOPY    . wisdoim teeth extraction      SOCIAL HISTORY: Social History   Tobacco Use  . Smoking status: Never Smoker  . Smokeless tobacco: Never Used  Substance Use Topics  . Alcohol use: Yes    Alcohol/week: 0.0 standard drinks    Comment: socially - occasional   . Drug use: No    FAMILY HISTORY: Family History  Problem Relation Age of Onset  . Throat cancer Mother   . Cancer - Other Mother 55       throat - died 36 months  . Cancer Mother   . Alcoholism Mother   . Heart disease Father   . Cancer Father   . Breast cancer Maternal Aunt        breast  . Colon cancer Maternal Uncle 79       died 42  . Breast cancer Maternal Aunt        breast  . Pancreatic cancer Maternal Aunt   . Diabetes Other        grandmother  . Hypertension Maternal Aunt        several family members  . Throat cancer Maternal Uncle   . Asthma Other        cousin, maternal  . Breast cancer Maternal Aunt        total of 5 aunts  . Breast cancer Maternal Aunt   . Colon cancer Paternal Grandfather   . Heart attack Neg Hx   . Rectal cancer Neg Hx    . Stomach cancer Neg Hx    ROS: Review of Systems  Gastrointestinal: Negative for nausea and vomiting.  Musculoskeletal:       Negative for muscle weakness.   PHYSICAL EXAM: Pt in no acute distress  RECENT LABS AND TESTS: BMET    Component Value Date/Time   NA 135 09/21/2018 2241   NA 140 05/06/2018 1421   K 3.6 09/21/2018 2241   CL 101 09/21/2018 2241   CO2 26 09/21/2018 2241   GLUCOSE 87 09/21/2018 2241   BUN 12 09/21/2018 2241   BUN 14 05/06/2018 1421   CREATININE 0.72 09/21/2018 2241   CALCIUM 8.9 09/21/2018 2241   GFRNONAA >60 09/21/2018 2241   GFRAA >60 09/21/2018 2241   Lab Results  Component Value Date   HGBA1C 5.9 (H) 05/06/2018   HGBA1C 5.8 (H) 01/23/2018   HGBA1C 5.5 08/14/2017   HGBA1C 5.6 06/09/2015   HGBA1C 5.7 (H) 05/22/2014   Lab Results  Component Value Date   INSULIN 11.2 05/06/2018   INSULIN 8.4 01/23/2018   INSULIN 8.0 08/14/2017   CBC    Component Value Date/Time   WBC 12.4 (H) 09/21/2018 2241   RBC 4.54 09/21/2018 2241   HGB 13.1 09/21/2018 2241   HGB 12.2 01/23/2018 1155   HCT 40.8 09/21/2018 2241   HCT 37.0 01/23/2018 1155   PLT 357 09/21/2018 2241   MCV 89.9 09/21/2018 2241   MCV 88 01/23/2018 1155   MCH 28.9 09/21/2018 2241   MCHC 32.1 09/21/2018 2241   RDW 14.0 09/21/2018 2241   RDW 15.4 01/23/2018 1155   LYMPHSABS 2.4 09/21/2018 2241   LYMPHSABS 1.9 01/23/2018 1155   MONOABS 0.8 09/21/2018 2241   EOSABS 0.1 09/21/2018 2241   EOSABS 0.1 01/23/2018 1155   BASOSABS 0.1 09/21/2018  2241   BASOSABS 0.0 01/23/2018 1155   Iron/TIBC/Ferritin/ %Sat No results found for: IRON, TIBC, FERRITIN, IRONPCTSAT Lipid Panel     Component Value Date/Time   CHOL 191 05/06/2018 1421   TRIG 109 05/06/2018 1421   HDL 65 05/06/2018 1421   CHOLHDL 3 05/14/2014 1223   VLDL 12.2 05/14/2014 1223   LDLCALC 104 (H) 05/06/2018 1421   Hepatic Function Panel     Component Value Date/Time   PROT 7.9 09/21/2018 2241   PROT 6.9 05/06/2018 1421    ALBUMIN 3.7 09/21/2018 2241   ALBUMIN 4.1 05/06/2018 1421   AST 20 09/21/2018 2241   ALT 22 09/21/2018 2241   ALKPHOS 68 09/21/2018 2241   BILITOT 0.3 09/21/2018 2241   BILITOT <0.2 05/06/2018 1421   BILIDIR 0.0 05/14/2014 1223      Component Value Date/Time   TSH 0.922 05/06/2018 1421   TSH 2.160 01/23/2018 1155   TSH 0.976 08/14/2017 1152   Results for ALEIGHA, GILANI A (MRN [id number]) as of 12/02/2018 10:33  Ref. Range 05/06/2018 14:21  Vitamin D, 25-Hydroxy Latest Ref Range: 30.0 - 100.0 ng/mL 34.0   I, Michaelene Song, am acting as Location manager for Dennard Nip, MD I have reviewed the above documentation for accuracy and completeness, and I agree with the above. -Dennard Nip, MD

## 2018-12-04 ENCOUNTER — Other Ambulatory Visit (INDEPENDENT_AMBULATORY_CARE_PROVIDER_SITE_OTHER): Payer: Self-pay | Admitting: Family Medicine

## 2018-12-04 ENCOUNTER — Encounter (INDEPENDENT_AMBULATORY_CARE_PROVIDER_SITE_OTHER): Payer: Self-pay | Admitting: Family Medicine

## 2018-12-04 DIAGNOSIS — Z6835 Body mass index (BMI) 35.0-35.9, adult: Secondary | ICD-10-CM

## 2018-12-04 NOTE — Telephone Encounter (Signed)
Please advise 

## 2018-12-06 ENCOUNTER — Ambulatory Visit (INDEPENDENT_AMBULATORY_CARE_PROVIDER_SITE_OTHER): Payer: BC Managed Care – PPO

## 2018-12-06 ENCOUNTER — Encounter: Payer: Self-pay | Admitting: Family Medicine

## 2018-12-06 ENCOUNTER — Other Ambulatory Visit: Payer: Self-pay

## 2018-12-06 ENCOUNTER — Ambulatory Visit
Admission: EM | Admit: 2018-12-06 | Discharge: 2018-12-06 | Disposition: A | Discharge status: Home / Self Care | Payer: BC Managed Care – PPO | Attending: Emergency Medicine | Admitting: Emergency Medicine

## 2018-12-06 DIAGNOSIS — R079 Chest pain, unspecified: Secondary | ICD-10-CM | POA: Diagnosis not present

## 2018-12-06 DIAGNOSIS — B9789 Other viral agents as the cause of diseases classified elsewhere: Secondary | ICD-10-CM

## 2018-12-06 DIAGNOSIS — R05 Cough: Secondary | ICD-10-CM | POA: Diagnosis not present

## 2018-12-06 DIAGNOSIS — J069 Acute upper respiratory infection, unspecified: Secondary | ICD-10-CM

## 2018-12-06 MED ORDER — AMOXICILLIN-POT CLAVULANATE 875-125 MG PO TABS: 1.0000 | ORAL_TABLET | Freq: Two times a day (BID) | ORAL | 0 refills | Status: AC

## 2018-12-06 MED ORDER — BENZONATATE 100 MG PO CAPS: 100.0000 mg | ORAL_CAPSULE | Freq: Three times a day (TID) | ORAL | 0 refills | Status: DC | PRN

## 2018-12-06 NOTE — ED Provider Notes (Signed)
[facility]    CSN: [id number] Arrival date & time: 12/06/18  1704     History   Chief Complaint Chief Complaint  Patient presents with  . Nasal Congestion    HPI Morgan Long is a 48 y.o. female.   Morgan Long presents with complaints of tight throat and chest tight. Started three days ago. Hoarse today. No nasal drainage, maybe some post nasal drip. Tried a neti pot which hasn't helped. Feels more of an effort to breathe. Coughs up phlegm, clear and yellow. Migraine. Loss of appetite. Migraines are normal for her. Upper back sore. No fevers. Slight nausea, but this can be with her migraine. Stool has been more loose, but then this can be common with her cycle. Chest pain  Worse with cough. Feels a tickle to back of throat. Throat pain. No ear pain. No known ill contacts. Has had similar to bronchitis she has had in the past. Has been taking zyrtec. No other medications for symptoms. History of asthma, has not used rescue inhaler but has been using her maintenance inhaler.      Past Medical History:  Diagnosis Date  . Allergic rhinitis   . Asthma   . Chronic migraine    Dr. Catalina Gravel  . Constipation   . Dry eye syndrome of both lacrimal glands   . Dry skin   . Endometriosis   . Fatigue   . Gastroparesis   . GERD (gastroesophageal reflux disease)   . Headache(784.0)    occasional, dx w/ Migraines before, Topamax helps  . Heartburn   . History of stomach ulcers   . Lactose intolerance   . Localized edema   . Migraine   . Muscle weakness (generalized)   . Nuclear cataract of both eyes    Mild  . Overweight   . Pain in right ankle and joints of right foot   . Shortness of breath   . Sinus complaint   . Sinusitis   . Tired   . TMJ pain dysfunction syndrome    occasional    Patient Active Problem List   Diagnosis Date Noted  . Acute vaginitis 10/09/2018  . Acute pain of left shoulder 03/12/2018  . Other fatigue 08/14/2017  . Shortness of breath  on exertion 08/14/2017  . Other insomnia 08/14/2017  . Medication management 06/12/2017  . Nonintractable headache 06/12/2017  . Gastroparesis 06/12/2017  . Severe obesity (BMI >= 40) ([place]) 04/25/2017  . Leukocytosis 03/01/2017  . Obesity (BMI 35.0-39.9 without comorbidity) 03/01/2017  . Chronic migraine without aura without status migrainosus, not intractable 03/01/2017  . Sinusitis 07/31/2016  . Influenza-like illness 07/12/2016  . Peroneal tendinitis of left lower extremity 11/03/2015  . Pes anserine bursitis 11/03/2015  . Asthma with acute exacerbation 06/15/2015  . Acute sinusitis 06/15/2015  . PCP NOTES >>>> 02/23/2015  . GERD (gastroesophageal reflux disease) 01/13/2015  . Myalgia and myositis 07/22/2014  . 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 Paresthesias 06/02/2014  . Acute pharyngitis 05/20/2014  . Strep pharyngitis 05/20/2014  . Degenerative cervical disc 10/28/2013  . Bursitis, shoulder 09/17/2013  . Muscle spasm of back 09/11/2013  . Nonallopathic lesion of thoracic region 09/11/2013  . General medical examination 03/28/2011  . Allergic rhinitis   . TMJ PAIN 12/16/2007  . Headache(784.0) 12/16/2007    Past Surgical History:  Procedure Laterality Date  . ANKLE SURGERY Left 11/16/2017  . AUGMENTATION MAMMAPLASTY Bilateral 2006  . BREAST ENHANCEMENT SURGERY  2006  . BUNIONECTOMY    .  COLONOSCOPY    . endrometroisis    . fallopian tube removed     Left  . UPPER GASTROINTESTINAL ENDOSCOPY    . wisdoim teeth extraction      OB History    Gravida  0   Para  0   Term  0   Preterm  0   AB  0   Living  0     SAB  0   TAB  0   Ectopic  0   Multiple  0   Live Births  0            Home Medications    Prior to Admission medications   Medication Sig Start Date End Date Taking? Authorizing Provider  AMBULATORY NON FORMULARY MEDICATION Medication Name: Domperidone 10 mg three times daily before meals and at bedtime 09/12/18   Pyrtle, Lajuan Lines,  MD  amoxicillin-clavulanate (AUGMENTIN) 875-125 MG tablet Take 1 tablet by mouth every 12 (twelve) hours for 10 days. 12/09/18 12/19/18  Zigmund Gottron, NP  BD PEN NEEDLE NANO U/F 32G X 4 MM MISC 1 PACKAGE BY DOES NOT APPLY ROUTE 2 (TWO) TIMES DAILY. 12/05/18   Dennard Nip D, MD  beclomethasone (QVAR REDIHALER) 80 MCG/ACT inhaler Inhale 2 puffs 2 (two) times daily into the lungs. 04/12/17   [name], DO  benzonatate (TESSALON) 100 MG capsule Take 1-2 capsules (100-200 mg total) by mouth 3 (three) times daily as needed for cough. 12/06/18   Zigmund Gottron, NP  budesonide-formoterol (SYMBICORT) 160-4.5 MCG/ACT inhaler Inhale 2 puffs 2 (two) times daily into the lungs. 04/16/17   Bobbitt, [place] Muta, MD  cetirizine (ZYRTEC) 10 MG tablet TAKE 1 TABLET BY MOUTH DAILY 05/10/18   Carollee Herter, Yvonne R, DO  ESTROGEL 0.75 MG/1.25 GM (0.06%) topical gel Apply 1 application topically See admin instructions. 05/18/15   [provider]  etonogestrel-ethinyl estradiol (NUVARING) 0.12-0.015 MG/24HR vaginal ring Place 1 each vaginally every 28 (twenty-eight) days. Insert vaginally and leave in place for 3 consecutive weeks, then remove for 1 week.     [provider]  famotidine (PEPCID) 20 MG tablet Take 1 tablet (20 mg total) by mouth as needed for heartburn or indigestion. 06/20/18   Pyrtle, Lajuan Lines, MD  ibuprofen (ADVIL,MOTRIN) 800 MG tablet Take 800 mg by mouth every 6 (six) hours as needed. for pain 11/27/16   [provider]  linaclotide Rolan Lipa) 72 MCG capsule Take 1 capsule (72 mcg total) by mouth daily before breakfast. 06/20/18   Pyrtle, Lajuan Lines, MD  Multiple Vitamin (MULTIVITAMIN) LIQD Take 5 mLs by mouth daily.    [provider]  ondansetron (ZOFRAN ODT) 4 MG disintegrating tablet 4mg  ODT q4 hours prn nausea/vomit 09/21/18   Drenda Freeze, MD  promethazine (PHENERGAN) 25 MG tablet Take 1 tablet (25 mg total) by mouth every 8 (eight) hours as needed for  nausea or vomiting. 09/25/18   Carollee Herter, Alferd Apa, DO  RESTASIS MULTIDOSE 0.05 % ophthalmic emulsion Place 1 drop into both eyes as needed. 11/25/16   [provider]  rizatriptan (MAXALT-MLT) 10 MG disintegrating tablet TAKE 1 TABLET (10 MG TOTAL) BY MOUTH AS NEEDED FOR MIGRAINE. MAY REPEAT IN 2 HOURS IF NEEDED 05/06/18   Star Age, MD  SAXENDA 18 MG/3ML SOPN INJECT 3 MG INTO THE SKIN DAILY. (NOT COVERED) CMD 12/04/18   Dennard Nip D, MD  Ubrogepant ([name]) 50 MG TABS Take 50 mg by mouth 2 (two) times daily  as needed (1 pill at onset of migraine, may repeat in 2 hours. no more than 4 pills a day). 09/30/18   Star Age, MD  Vitamin D, Ergocalciferol, (DRISDOL) 1.25 MG (50000 UT) CAPS capsule Take 1 capsule (50,000 Units total) by mouth every 7 (seven) days. 11/30/18   Starlyn Skeans, MD    Family History Family History  Problem Relation Age of Onset  . Throat cancer Mother   . Cancer - Other Mother 55       throat - died 85 months  . Cancer Mother   . Alcoholism Mother   . Heart disease Father   . Cancer Father   . Breast cancer Maternal Aunt        breast  . Colon cancer Maternal Uncle 66       died 65  . Breast cancer Maternal Aunt        breast  . Pancreatic cancer Maternal Aunt   . Diabetes Other        grandmother  . Hypertension Maternal Aunt        several family members  . Throat cancer Maternal Uncle   . Asthma Other        cousin, maternal  . Breast cancer Maternal Aunt        total of 5 aunts  . Breast cancer Maternal Aunt   . Colon cancer Paternal Grandfather   . Heart attack Neg Hx   . Rectal cancer Neg Hx   . Stomach cancer Neg Hx     Social History Social History   Tobacco Use  . Smoking status: Never Smoker  . Smokeless tobacco: Never Used  Substance Use Topics  . Alcohol use: Yes    Alcohol/week: 0.0 standard drinks    Comment: socially - occasional   . Drug use: No     Allergies   Acetaminophen, Hydrocodone, Oxycodone,  Phosphate, and Codeine   Review of Systems Review of Systems   Physical Exam Triage Vital Signs ED Triage Vitals  Enc Vitals Group     BP 12/06/18 1713 (!) 136/96     Pulse Rate 12/06/18 1713 (!) 103     Resp 12/06/18 1713 18     Temp 12/06/18 1713 99.6 F (37.6 C)     Temp Source 12/06/18 1713 Oral     SpO2 12/06/18 1713 97 %     Weight --      Height --      Head Circumference --      Peak Flow --      Pain Score 12/06/18 1714 7     Pain Loc --      Pain Edu? --      Excl. in [place]? --    No data found.  Updated Vital Signs BP (!) 136/96 (BP Location: Left Arm)   Pulse (!) 103   Temp 99.6 F (37.6 C) (Oral)   Resp 18   LMP 12/01/2018   SpO2 97%   V Physical Exam Constitutional:      General: She is not in acute distress.    Appearance: She is well-developed.  HENT:     Mouth/Throat:     Pharynx: Posterior oropharyngeal erythema present.     Tonsils: No tonsillar exudate.     Comments: Mild posteriororopharynx erythema present Cardiovascular:     Rate and Rhythm: Normal rate.  Pulmonary:     Effort: Pulmonary effort is normal.     Breath sounds: No wheezing.  Comments: No increased work of breathing, speech clear without difficulty.  Skin:    General: Skin is warm and dry.  Neurological:     Mental Status: She is alert and oriented to person, place, and time.      UC Treatments / Results  Labs (all labs ordered are listed, but only abnormal results are displayed) Labs Reviewed - No data to display  EKG   Radiology Dg Chest 2 View  Result Date: 12/06/2018 CLINICAL DATA:  48 year old female with chest pain and cough. Nasal congestion. EXAM: CHEST - 2 VIEW COMPARISON:  Portable chest 09/21/2018 and earlier. FINDINGS: Stable lung volumes. Stable cardiac size at the upper limits of normal. Other mediastinal contours are within normal limits. Visualized tracheal air column is within normal limits. There are mild chronic increased pulmonary  interstitial markings, stable since at least 2018, and otherwise the lungs appear clear. No pneumothorax or pleural effusion. No acute osseous abnormality identified. Negative visible bowel gas pattern. IMPRESSION: No acute cardiopulmonary abnormality. Electronically Signed   By: Genevie Ann M.D.   On: 12/06/2018 18:08    Procedures Procedures (including critical care time)  Medications Ordered in UC Medications - No data to display  Initial Impression / Assessment and Plan / UC Course  I have reviewed the triage vital signs and the nursing notes.  Pertinent labs & imaging results that were available during my care of the patient were reviewed by me and considered in my medical decision making (see chart for details).     Exam is overall reassuring. Afebrile. Lungs clear and xray without acute findings. No hypoxia. Mild tachycardia on arrival. No facial pressure and minimal congestion. Question sinusitis, antibiotics provided to take early next week if symptoms worsen. Continue with supportive cares. covid testing requested. Return precautions provided. Patient verbalized understanding and agreeable to plan.   Final Clinical Impressions(s) / UC Diagnoses   Final diagnoses:  Viral URI with cough     Discharge Instructions     Push fluids to ensure adequate hydration and keep secretions thin.  Tessalon as needed for cough, likely won't take away the cough completely but may help to decrease the frequency of it.  May use a decongestant as well such as mucinex to loosen secretions.  May start antibiotics on Monday if symptoms have worsened in any way. Rest.  I have placed order to get covid testing set up. Due to your symptoms in presence of pandemic I do recommend Covid-19 testing. You will be called to set up an appointment time to be tested at our [facility]. Results take about 2-3 days.  Please self isolate until you receive this results.   Use of your inhaler as needed.  If  worsening of chest pain , shortness of breath , difficulty breathing or otherwise worsening please go to the ER.      ED Prescriptions    Medication Sig Dispense Auth. Provider   benzonatate (TESSALON) 100 MG capsule Take 1-2 capsules (100-200 mg total) by mouth 3 (three) times daily as needed for cough. 21 capsule Augusto Gamble B, NP   amoxicillin-clavulanate (AUGMENTIN) 875-125 MG tablet Take 1 tablet by mouth every 12 (twelve) hours for 10 days. 20 tablet Zigmund Gottron, NP     Controlled Substance Prescriptions [place] Controlled Substance Registry consulted? Not Applicable   Zigmund Gottron, NP 12/06/18 [phone]

## 2018-12-06 NOTE — ED Triage Notes (Signed)
Pt c/o sinus and nasal congestion, scratchy throat, productive cough with yellow sputum

## 2018-12-06 NOTE — Discharge Instructions (Signed)
Push fluids to ensure adequate hydration and keep secretions thin.  Tessalon as needed for cough, likely won't take away the cough completely but may help to decrease the frequency of it.  May use a decongestant as well such as mucinex to loosen secretions.  May start antibiotics on Monday if symptoms have worsened in any way. Rest.  I have placed order to get covid testing set up. Due to your symptoms in presence of pandemic I do recommend Covid-19 testing. You will be called to set up an appointment time to be tested at our [facility]. Results take about 2-3 days.  Please self isolate until you receive this results.   Use of your inhaler as needed.  If worsening of chest pain , shortness of breath , difficulty breathing or otherwise worsening please go to the ER.

## 2018-12-07 ENCOUNTER — Telehealth: Payer: Self-pay

## 2018-12-07 DIAGNOSIS — Z20822 Contact with and (suspected) exposure to covid-19: Secondary | ICD-10-CM

## 2018-12-07 NOTE — Telephone Encounter (Signed)
Called pt and scheduled testing for Monday 12/09/18 at 3:45. Informed pt to drive up and stay in car, wear mask to testing site and address given of testing site.

## 2018-12-07 NOTE — Telephone Encounter (Signed)
-----   Message from Zigmund Gottron, NP sent at 12/06/2018  6:16 PM EDT ----- Regarding: covid testing needed

## 2018-12-09 ENCOUNTER — Other Ambulatory Visit: Payer: BC Managed Care – PPO

## 2018-12-09 DIAGNOSIS — R6889 Other general symptoms and signs: Secondary | ICD-10-CM | POA: Diagnosis not present

## 2018-12-09 DIAGNOSIS — Z20822 Contact with and (suspected) exposure to covid-19: Secondary | ICD-10-CM

## 2018-12-14 ENCOUNTER — Encounter: Payer: Self-pay | Admitting: Family Medicine

## 2018-12-14 LAB — NOVEL CORONAVIRUS, NAA: SARS-CoV-2, NAA: NOT DETECTED

## 2018-12-16 ENCOUNTER — Ambulatory Visit (INDEPENDENT_AMBULATORY_CARE_PROVIDER_SITE_OTHER): Payer: BC Managed Care – PPO | Admitting: Family Medicine

## 2018-12-17 ENCOUNTER — Encounter: Payer: Self-pay | Admitting: Family Medicine

## 2018-12-17 ENCOUNTER — Ambulatory Visit (INDEPENDENT_AMBULATORY_CARE_PROVIDER_SITE_OTHER): Payer: BC Managed Care – PPO | Admitting: Family Medicine

## 2018-12-17 ENCOUNTER — Other Ambulatory Visit: Payer: Self-pay

## 2018-12-17 ENCOUNTER — Telehealth: Payer: Self-pay

## 2018-12-17 ENCOUNTER — Telehealth (HOSPITAL_COMMUNITY): Payer: Self-pay | Admitting: Emergency Medicine

## 2018-12-17 DIAGNOSIS — J40 Bronchitis, not specified as acute or chronic: Secondary | ICD-10-CM | POA: Diagnosis not present

## 2018-12-17 MED ORDER — PROMETHAZINE-DM 6.25-15 MG/5ML PO SYRP: 5.0000 mL | ORAL_SOLUTION | Freq: Four times a day (QID) | ORAL | 0 refills | Status: DC | PRN

## 2018-12-17 NOTE — Progress Notes (Deleted)
Patient ID: Morgan Long, female    DOB: 1970/10/14  Age: 48 y.o. MRN: [id number]    Subjective:  Subjective  HPI Morgan Long presents for ***  Review of Systems  History Past Medical History:  Diagnosis Date  . Allergic rhinitis   . Asthma   . Chronic migraine    Dr. Catalina Gravel  . Constipation   . Dry eye syndrome of both lacrimal glands   . Dry skin   . Endometriosis   . Fatigue   . Gastroparesis   . GERD (gastroesophageal reflux disease)   . Headache(784.0)    occasional, dx w/ Migraines before, Topamax helps  . Heartburn   . History of stomach ulcers   . Lactose intolerance   . Localized edema   . Migraine   . Muscle weakness (generalized)   . Nuclear cataract of both eyes    Mild  . Overweight   . Pain in right ankle and joints of right foot   . Shortness of breath   . Sinus complaint   . Sinusitis   . Tired   . TMJ pain dysfunction syndrome    occasional    She has a past surgical history that includes fallopian tube removed; Breast enhancement surgery (2006); endrometroisis; Augmentation mammaplasty (Bilateral, 2006); wisdoim teeth extraction; Colonoscopy; Upper gastrointestinal endoscopy; Bunionectomy; and Ankle surgery (Left, 11/16/2017).   Her family history includes Alcoholism in her mother; Asthma in an other family member; Breast cancer in her maternal aunt, maternal aunt, maternal aunt, and maternal aunt; Cancer in her father and mother; Cancer - Other (age of onset: 13) in her mother; Colon cancer in her paternal grandfather; Colon cancer (age of onset: 93) in her maternal uncle; Diabetes in an other family member; Heart disease in her father; Hypertension in her maternal aunt; Pancreatic cancer in her maternal aunt; Throat cancer in her maternal uncle and mother.She reports that she has never smoked. She has never used smokeless tobacco. She reports current alcohol use. She reports that she does not use drugs.  Current Outpatient Medications on File  Prior to Visit  Medication Sig Dispense Refill  . AMBULATORY NON FORMULARY MEDICATION Medication Name: Domperidone 10 mg three times daily before meals and at bedtime 800 tablet 1  . amoxicillin-clavulanate (AUGMENTIN) 875-125 MG tablet Take 1 tablet by mouth every 12 (twelve) hours for 10 days. 20 tablet 0  . BD PEN NEEDLE NANO U/F 32G X 4 MM MISC 1 PACKAGE BY DOES NOT APPLY ROUTE 2 (TWO) TIMES DAILY. 100 each 0  . beclomethasone (QVAR REDIHALER) 80 MCG/ACT inhaler Inhale 2 puffs 2 (two) times daily into the lungs. 10.6 g 5  . benzonatate (TESSALON) 100 MG capsule Take 1-2 capsules (100-200 mg total) by mouth 3 (three) times daily as needed for cough. 21 capsule 0  . budesonide-formoterol (SYMBICORT) 160-4.5 MCG/ACT inhaler Inhale 2 puffs 2 (two) times daily into the lungs. 1 Inhaler 5  . cetirizine (ZYRTEC) 10 MG tablet TAKE 1 TABLET BY MOUTH DAILY 30 tablet 5  . ESTROGEL 0.75 MG/1.25 GM (0.06%) topical gel Apply 1 application topically See admin instructions.  0  . etonogestrel-ethinyl estradiol (NUVARING) 0.12-0.015 MG/24HR vaginal ring Place 1 each vaginally every 28 (twenty-eight) days. Insert vaginally and leave in place for 3 consecutive weeks, then remove for 1 week.     . famotidine (PEPCID) 20 MG tablet Take 1 tablet (20 mg total) by mouth as needed for heartburn or indigestion. 30 tablet 3  . ibuprofen (ADVIL,MOTRIN)  800 MG tablet Take 800 mg by mouth every 6 (six) hours as needed. for pain  3  . linaclotide (LINZESS) 72 MCG capsule Take 1 capsule (72 mcg total) by mouth daily before breakfast. 30 capsule 3  . Multiple Vitamin (MULTIVITAMIN) LIQD Take 5 mLs by mouth daily.    . ondansetron (ZOFRAN ODT) 4 MG disintegrating tablet 4mg  ODT q4 hours prn nausea/vomit 10 tablet 0  . promethazine (PHENERGAN) 25 MG tablet Take 1 tablet (25 mg total) by mouth every 8 (eight) hours as needed for nausea or vomiting. 20 tablet 0  . RESTASIS MULTIDOSE 0.05 % ophthalmic emulsion Place 1 drop into both  eyes as needed.  3  . rizatriptan (MAXALT-MLT) 10 MG disintegrating tablet TAKE 1 TABLET (10 MG TOTAL) BY MOUTH AS NEEDED FOR MIGRAINE. MAY REPEAT IN 2 HOURS IF NEEDED 9 tablet 11  . SAXENDA 18 MG/3ML SOPN INJECT 3 MG INTO THE SKIN DAILY. (NOT COVERED) CMD 15 mL 0  . Ubrogepant ([name]) 50 MG TABS Take 50 mg by mouth 2 (two) times daily as needed (1 pill at onset of migraine, may repeat in 2 hours. no more than 4 pills a day). 10 tablet 3  . Vitamin D, Ergocalciferol, (DRISDOL) 1.25 MG (50000 UT) CAPS capsule Take 1 capsule (50,000 Units total) by mouth every 7 (seven) days. 4 capsule 0   No current facility-administered medications on file prior to visit.      Objective:  Objective  Physical Exam LMP 12/01/2018  Wt Readings from Last 3 Encounters:  09/21/18 195 lb (88.5 kg)  08/06/18 198 lb (89.8 kg)  07/17/18 203 lb (92.1 kg)     Lab Results  Component Value Date   WBC 12.4 (H) 09/21/2018   HGB 13.1 09/21/2018   HCT 40.8 09/21/2018   PLT 357 09/21/2018   GLUCOSE 87 09/21/2018   CHOL 191 05/06/2018   TRIG 109 05/06/2018   HDL 65 05/06/2018   LDLCALC 104 (H) 05/06/2018   ALT 22 09/21/2018   AST 20 09/21/2018   NA 135 09/21/2018   K 3.6 09/21/2018   CL 101 09/21/2018   CREATININE 0.72 09/21/2018   BUN 12 09/21/2018   CO2 26 09/21/2018   TSH 0.922 05/06/2018   HGBA1C 5.9 (H) 05/06/2018    Dg Chest 2 View  Result Date: 12/06/2018 CLINICAL DATA:  48 year old female with chest pain and cough. Nasal congestion. EXAM: CHEST - 2 VIEW COMPARISON:  Portable chest 09/21/2018 and earlier. FINDINGS: Stable lung volumes. Stable cardiac size at the upper limits of normal. Other mediastinal contours are within normal limits. Visualized tracheal air column is within normal limits. There are mild chronic increased pulmonary interstitial markings, stable since at least 2018, and otherwise the lungs appear clear. No pneumothorax or pleural effusion. No acute osseous abnormality identified.  Negative visible bowel gas pattern. IMPRESSION: No acute cardiopulmonary abnormality. Electronically Signed   By: Genevie Ann M.D.   On: 12/06/2018 18:08     Assessment & Plan:  Plan  I am having [name] maintain her etonogestrel-ethinyl estradiol, multivitamin, Estrogel, Restasis Multidose, ibuprofen, beclomethasone, budesonide-formoterol, rizatriptan, cetirizine, famotidine, linaclotide, AMBULATORY NON FORMULARY MEDICATION, ondansetron, promethazine, Ubrogepant, Vitamin D (Ergocalciferol), Saxenda, BD Pen Needle Nano U/F, benzonatate, and amoxicillin-clavulanate.  No orders of the defined types were placed in this encounter.   Problem List Items Addressed This Visit    None      Follow-up: No follow-ups on file.  [name], DO

## 2018-12-17 NOTE — Telephone Encounter (Signed)
Your test for COVID-19 was negative.  Please continue good preventive care measures, including:  frequent hand-washing, avoid touching your face, cover coughs/sneezes, stay out of crowds and keep a 6 foot distance from others.  If you develop fever/cough/breathlessness, please stay home for 10 days and until you have had 3 consecutive days with cough/breathlessness improving and without fever (without taking a fever reducer). Go to the nearest hospital ED tent for assessment if fever/cough/breathlessness are severe or illness seems like a threat to life.  Patient contacted and made aware of all results, all questions answered.  Pt had not started antibiotics yet, wanted the okay to start taking them. Per natalies note, okay to take if not getting better. Pt c/o still having symptoms. Encouraged to follow up with PCP or at urgent care for any reason. All questions answered.

## 2018-12-17 NOTE — Telephone Encounter (Signed)
Noted, thank you. DW  °

## 2018-12-17 NOTE — Telephone Encounter (Signed)
PA started for ubrelvy on covermymeds. PA asks if pt is receiving benefit from [name].   I called pt. Morgan Long is working well treating her acute migraines. Pt also scheduled her botox appt.  PA for ubrelvy submitted to [employer]. Key: [id number]. Should have a determination in 1-3 business days.

## 2018-12-17 NOTE — Progress Notes (Signed)
.  Virtual Visit via Video Note  I connected with Morgan Long on 12/17/18 at  4:00 PM EDT by a video enabled telemedicine application and verified that I am speaking with the correct person using two identifiers.  Location: Patient: home Provider: office   I discussed the limitations of evaluation and management by telemedicine and the availability of in person appointments. The patient expressed understanding and agreed to proceed. Pt was not able to log in with text-- had to transition to tele visit   History of Present Illness: Pt c/o sinus congestion , cough x 2 weeks.  She was seen in UC and given rx for Augmentin but did not start it.  She also needs something for cough.  No fever.  Cough is productive --- yellow / green mucus.   She just tested neg for covid.     Observations/Objective: .no vitals obtained Pt states she is afebrile  Pt is in NAD  Assessment and Plan: 1. Bronchitis Pt has augmentin from uc---  Start takeing  Cough med per orders  Call before weekend if no better and we will get cxr  - promethazine-dextromethorphan (PROMETHAZINE-DM) 6.25-15 MG/5ML syrup; Take 5 mLs by mouth 4 (four) times daily as needed.  Dispense: 118 mL; Refill: 0   Follow Up Instructions:    I discussed the assessment and treatment plan with the patient. The patient was provided an opportunity to ask questions and all were answered. The patient agreed with the plan and demonstrated an understanding of the instructions.   The patient was advised to call back or seek an in-person evaluation if the symptoms worsen or if the condition fails to improve as anticipated.  I provided 20 minutes of non-face-to-face time during this encounter.   [name], DO

## 2018-12-19 DIAGNOSIS — H16223 Keratoconjunctivitis sicca, not specified as Sjogren's, bilateral: Secondary | ICD-10-CM | POA: Diagnosis not present

## 2018-12-19 DIAGNOSIS — H04123 Dry eye syndrome of bilateral lacrimal glands: Secondary | ICD-10-CM | POA: Diagnosis not present

## 2018-12-19 DIAGNOSIS — H10413 Chronic giant papillary conjunctivitis, bilateral: Secondary | ICD-10-CM | POA: Diagnosis not present

## 2018-12-19 NOTE — Telephone Encounter (Signed)
[employer] called in and stated pts PA was approved

## 2018-12-19 NOTE — Telephone Encounter (Signed)
Noted, thanks!

## 2018-12-24 ENCOUNTER — Other Ambulatory Visit (INDEPENDENT_AMBULATORY_CARE_PROVIDER_SITE_OTHER): Payer: Self-pay | Admitting: Family Medicine

## 2018-12-24 DIAGNOSIS — Z6835 Body mass index (BMI) 35.0-35.9, adult: Secondary | ICD-10-CM

## 2018-12-25 ENCOUNTER — Other Ambulatory Visit: Payer: Self-pay | Admitting: Family Medicine

## 2018-12-25 DIAGNOSIS — R05 Cough: Secondary | ICD-10-CM

## 2018-12-25 DIAGNOSIS — R059 Cough, unspecified: Secondary | ICD-10-CM

## 2018-12-25 NOTE — Telephone Encounter (Signed)
Order is in for cxr

## 2018-12-26 ENCOUNTER — Telehealth: Payer: Self-pay | Admitting: Neurology

## 2018-12-26 NOTE — Telephone Encounter (Signed)
I called Covermymeds. That PA is a duplicate. Nothing further needed.

## 2018-12-26 NOTE — Telephone Encounter (Signed)
Malcolm from [employer] called needing to speak to RN regarding the PA for the pt's [name]. REF# [id number]

## 2018-12-30 ENCOUNTER — Ambulatory Visit (INDEPENDENT_AMBULATORY_CARE_PROVIDER_SITE_OTHER): Payer: BC Managed Care – PPO | Admitting: Family Medicine

## 2018-12-30 DIAGNOSIS — H524 Presbyopia: Secondary | ICD-10-CM | POA: Diagnosis not present

## 2019-01-03 ENCOUNTER — Ambulatory Visit (HOSPITAL_BASED_OUTPATIENT_CLINIC_OR_DEPARTMENT_OTHER)
Admission: RE | Admit: 2019-01-03 | Discharge: 2019-01-03 | Disposition: A | Discharge status: Home / Self Care | Payer: BC Managed Care – PPO | Source: Ambulatory Visit | Attending: Family Medicine | Admitting: Family Medicine

## 2019-01-03 ENCOUNTER — Other Ambulatory Visit: Payer: Self-pay

## 2019-01-03 DIAGNOSIS — R059 Cough, unspecified: Secondary | ICD-10-CM

## 2019-01-03 DIAGNOSIS — R05 Cough: Secondary | ICD-10-CM

## 2019-01-12 ENCOUNTER — Encounter: Payer: Self-pay | Admitting: Family Medicine

## 2019-01-13 ENCOUNTER — Other Ambulatory Visit: Payer: Self-pay

## 2019-01-13 ENCOUNTER — Encounter (INDEPENDENT_AMBULATORY_CARE_PROVIDER_SITE_OTHER): Payer: Self-pay | Admitting: Family Medicine

## 2019-01-13 ENCOUNTER — Ambulatory Visit (INDEPENDENT_AMBULATORY_CARE_PROVIDER_SITE_OTHER): Payer: BC Managed Care – PPO | Admitting: Family Medicine

## 2019-01-13 VITALS — BP 125/76 | HR 89 | Temp 98.3°F | Ht 63.0 in | Wt 221.0 lb

## 2019-01-13 DIAGNOSIS — Z6839 Body mass index (BMI) 39.0-39.9, adult: Secondary | ICD-10-CM | POA: Diagnosis not present

## 2019-01-13 DIAGNOSIS — E559 Vitamin D deficiency, unspecified: Secondary | ICD-10-CM

## 2019-01-13 NOTE — Telephone Encounter (Signed)
Can she come in today?

## 2019-01-14 MED ORDER — PHENTERMINE HCL 15 MG PO CAPS: 15.0000 mg | ORAL_CAPSULE | ORAL | 0 refills | Status: DC

## 2019-01-14 MED ORDER — VITAMIN D (ERGOCALCIFEROL) 1.25 MG (50000 UNIT) PO CAPS: 50000.0000 [IU] | ORAL_CAPSULE | ORAL | 0 refills | Status: DC

## 2019-01-14 NOTE — Progress Notes (Signed)
Office: [phone]  /  Fax: [phone]   HPI:   Chief Complaint: OBESITY Morgan Long is here to discuss her progress with her obesity treatment plan. She is on the keep a food journal with 1200 to 1400 calories and 80 grams of protein daily plan and she is following her eating plan approximately 50 % of the time. She states she is exercising 0 minutes 0 times per week. Morgan Long last visit in the office was five months ago, and she has gained a significant amount of weight. Patient notes increased PM hunger and she tries to save her calories for the evening, but she is eating very little in the daytime, which causes increased PM hunger. Her weight is 221 lb (100.2 kg) today and has had a weight gain of 23 pounds since her last in-office visit. She has gained 21 lbs since starting treatment with [name].  Vitamin D deficiency Morgan Long has a diagnosis of vitamin D deficiency. Morgan Long is stable on vit D and she denies nausea, vomiting or muscle weakness.  ASSESSMENT AND PLAN:  Class 2 severe obesity with serious comorbidity and body mass index (BMI) of 39.0 to 39.9 in adult, unspecified obesity type ([place]) - Plan: phentermine 15 MG capsule  Vitamin D deficiency - Plan: Vitamin D, Ergocalciferol, (DRISDOL) 1.25 MG (50000 UT) CAPS capsule  PLAN:  Vitamin D Deficiency Clorine was informed that low vitamin D levels contributes to fatigue and are associated with obesity, breast, and colon cancer. She agrees to continue to take prescription Vit D @50 ,000 IU every week #4 with no refills and will follow up for routine testing of vitamin D, at least 2-3 times per year. She was informed of the risk of over-replacement of vitamin D and agrees to not increase her dose unless she discusses this with [name] first. Morgan Long agrees to follow up with our clinic in 2 weeks.  I spent > than 50% of the 25 minute visit on counseling as documented in the note.  Obesity [name] is currently in the action stage of change. As such, her goal is  to continue with weight loss efforts She has agreed to keep a food journal with 1200 to 1400 calories and 80 grams of protein daily Morgan Long has been instructed to work up to a goal of 150 minutes of combined cardio and strengthening exercise per week for weight loss and overall health benefits. We discussed the following Behavioral Modification Strategies today: increasing lean protein intake and decreasing simple carbohydrates   Morgan Long agrees to continue Saxenda 1.2 mg and start Phentermine (phentermine contract signed) 15 mg q AM #30 with no refills.  Morgan Long has agreed to follow up with our clinic in 2 weeks. She was informed of the importance of frequent follow up visits to maximize her success with intensive lifestyle modifications for her multiple health conditions.  ALLERGIES: Allergies  Allergen Reactions  . Acetaminophen Itching    Patient can take tylenol, just not tylenol#3  . Hydrocodone Nausea Only  . Oxycodone Itching  . Phosphate Itching    sick  . Codeine Itching and Nausea Only    MEDICATIONS: Current Outpatient Medications on File Prior to Visit  Medication Sig Dispense Refill  . AMBULATORY NON FORMULARY MEDICATION Medication Name: Domperidone 10 mg three times daily before meals and at bedtime 800 tablet 1  . BD PEN NEEDLE NANO U/F 32G X 4 MM MISC 1 PACKAGE BY DOES NOT APPLY ROUTE 2 (TWO) TIMES DAILY. 100 each 0  . beclomethasone (QVAR REDIHALER)  80 MCG/ACT inhaler Inhale 2 puffs 2 (two) times daily into the lungs. 10.6 g 5  . benzonatate (TESSALON) 100 MG capsule Take 1-2 capsules (100-200 mg total) by mouth 3 (three) times daily as needed for cough. 21 capsule 0  . budesonide-formoterol (SYMBICORT) 160-4.5 MCG/ACT inhaler Inhale 2 puffs 2 (two) times daily into the lungs. 1 Inhaler 5  . cetirizine (ZYRTEC) 10 MG tablet TAKE 1 TABLET BY MOUTH DAILY 30 tablet 5  . ESTROGEL 0.75 MG/1.25 GM (0.06%) topical gel Apply 1 application topically See admin instructions.  0  .  etonogestrel-ethinyl estradiol (NUVARING) 0.12-0.015 MG/24HR vaginal ring Place 1 each vaginally every 28 (twenty-eight) days. Insert vaginally and leave in place for 3 consecutive weeks, then remove for 1 week.     . famotidine (PEPCID) 20 MG tablet Take 1 tablet (20 mg total) by mouth as needed for heartburn or indigestion. 30 tablet 3  . ibuprofen (ADVIL,MOTRIN) 800 MG tablet Take 800 mg by mouth every 6 (six) hours as needed. for pain  3  . linaclotide (LINZESS) 72 MCG capsule Take 1 capsule (72 mcg total) by mouth daily before breakfast. 30 capsule 3  . Multiple Vitamin (MULTIVITAMIN) LIQD Take 5 mLs by mouth daily.    . RESTASIS MULTIDOSE 0.05 % ophthalmic emulsion Place 1 drop into both eyes as needed.  3  . rizatriptan (MAXALT-MLT) 10 MG disintegrating tablet TAKE 1 TABLET (10 MG TOTAL) BY MOUTH AS NEEDED FOR MIGRAINE. MAY REPEAT IN 2 HOURS IF NEEDED 9 tablet 11  . SAXENDA 18 MG/3ML SOPN INJECT 3 MG INTO THE SKIN DAILY. (NOT COVERED) CMD 15 mL 0  . Ubrogepant ([name]) 50 MG TABS Take 50 mg by mouth 2 (two) times daily as needed (1 pill at onset of migraine, may repeat in 2 hours. no more than 4 pills a day). 10 tablet 3   No current facility-administered medications on file prior to visit.     PAST MEDICAL HISTORY: Past Medical History:  Diagnosis Date  . Allergic rhinitis   . Asthma   . Chronic migraine    Dr. Catalina Gravel  . Constipation   . Dry eye syndrome of both lacrimal glands   . Dry skin   . Endometriosis   . Fatigue   . Gastroparesis   . GERD (gastroesophageal reflux disease)   . Headache(784.0)    occasional, dx w/ Migraines before, Topamax helps  . Heartburn   . History of stomach ulcers   . Lactose intolerance   . Localized edema   . Migraine   . Muscle weakness (generalized)   . Nuclear cataract of both eyes    Mild  . Overweight   . Pain in right ankle and joints of right foot   . Shortness of breath   . Sinus complaint   . Sinusitis   . Tired   . TMJ pain  dysfunction syndrome    occasional    PAST SURGICAL HISTORY: Past Surgical History:  Procedure Laterality Date  . ANKLE SURGERY Left 11/16/2017  . AUGMENTATION MAMMAPLASTY Bilateral 2006  . BREAST ENHANCEMENT SURGERY  2006  . BUNIONECTOMY    . COLONOSCOPY    . endrometroisis    . fallopian tube removed     Left  . UPPER GASTROINTESTINAL ENDOSCOPY    . wisdoim teeth extraction      SOCIAL HISTORY: Social History   Tobacco Use  . Smoking status: Never Smoker  . Smokeless tobacco: Never Used  Substance Use Topics  .  Alcohol use: Yes    Alcohol/week: 0.0 standard drinks    Comment: socially - occasional   . Drug use: No    FAMILY HISTORY: Family History  Problem Relation Age of Onset  . Throat cancer Mother   . Cancer - Other Mother 55       throat - died 76 months  . Cancer Mother   . Alcoholism Mother   . Heart disease Father   . Cancer Father   . Breast cancer Maternal Aunt        breast  . Colon cancer Maternal Uncle 53       died 34  . Breast cancer Maternal Aunt        breast  . Pancreatic cancer Maternal Aunt   . Diabetes Other        grandmother  . Hypertension Maternal Aunt        several family members  . Throat cancer Maternal Uncle   . Asthma Other        cousin, maternal  . Breast cancer Maternal Aunt        total of 5 aunts  . Breast cancer Maternal Aunt   . Colon cancer Paternal Grandfather   . Heart attack Neg Hx   . Rectal cancer Neg Hx   . Stomach cancer Neg Hx     ROS: Review of Systems  Constitutional: Negative for weight loss.  Gastrointestinal: Negative for nausea and vomiting.  Musculoskeletal:       Negative for muscle weakness    PHYSICAL EXAM: Blood pressure 125/76, pulse 89, temperature 98.3 F (36.8 C), temperature source Oral, height 5\' 3"  (1.6 m), weight 221 lb (100.2 kg), SpO2 98 %. Body mass index is 39.15 kg/m. Physical Exam Vitals signs reviewed.  Constitutional:      Appearance: Normal appearance. She is  well-developed. She is obese.  Cardiovascular:     Rate and Rhythm: Normal rate.  Pulmonary:     Effort: Pulmonary effort is normal.  Musculoskeletal: Normal range of motion.  Skin:    General: Skin is warm and dry.  Neurological:     Mental Status: She is alert and oriented to person, place, and time.  Psychiatric:        Mood and Affect: Mood normal.        Behavior: Behavior normal.     RECENT LABS AND TESTS: BMET    Component Value Date/Time   NA 135 09/21/2018 2241   NA 140 05/06/2018 1421   K 3.6 09/21/2018 2241   CL 101 09/21/2018 2241   CO2 26 09/21/2018 2241   GLUCOSE 87 09/21/2018 2241   BUN 12 09/21/2018 2241   BUN 14 05/06/2018 1421   CREATININE 0.72 09/21/2018 2241   CALCIUM 8.9 09/21/2018 2241   GFRNONAA >60 09/21/2018 2241   GFRAA >60 09/21/2018 2241   Lab Results  Component Value Date   HGBA1C 5.9 (H) 05/06/2018   HGBA1C 5.8 (H) 01/23/2018   HGBA1C 5.5 08/14/2017   HGBA1C 5.6 06/09/2015   HGBA1C 5.7 (H) 05/22/2014   Lab Results  Component Value Date   INSULIN 11.2 05/06/2018   INSULIN 8.4 01/23/2018   INSULIN 8.0 08/14/2017   CBC    Component Value Date/Time   WBC 12.4 (H) 09/21/2018 2241   RBC 4.54 09/21/2018 2241   HGB 13.1 09/21/2018 2241   HGB 12.2 01/23/2018 1155   HCT 40.8 09/21/2018 2241   HCT 37.0 01/23/2018 1155   PLT 357 09/21/2018 2241  MCV 89.9 09/21/2018 2241   MCV 88 01/23/2018 1155   MCH 28.9 09/21/2018 2241   MCHC 32.1 09/21/2018 2241   RDW 14.0 09/21/2018 2241   RDW 15.4 01/23/2018 1155   LYMPHSABS 2.4 09/21/2018 2241   LYMPHSABS 1.9 01/23/2018 1155   MONOABS 0.8 09/21/2018 2241   EOSABS 0.1 09/21/2018 2241   EOSABS 0.1 01/23/2018 1155   BASOSABS 0.1 09/21/2018 2241   BASOSABS 0.0 01/23/2018 1155   Iron/TIBC/Ferritin/ %Sat No results found for: IRON, TIBC, FERRITIN, IRONPCTSAT Lipid Panel     Component Value Date/Time   CHOL 191 05/06/2018 1421   TRIG 109 05/06/2018 1421   HDL 65 05/06/2018 1421   CHOLHDL  3 05/14/2014 1223   VLDL 12.2 05/14/2014 1223   LDLCALC 104 (H) 05/06/2018 1421   Hepatic Function Panel     Component Value Date/Time   PROT 7.9 09/21/2018 2241   PROT 6.9 05/06/2018 1421   ALBUMIN 3.7 09/21/2018 2241   ALBUMIN 4.1 05/06/2018 1421   AST 20 09/21/2018 2241   ALT 22 09/21/2018 2241   ALKPHOS 68 09/21/2018 2241   BILITOT 0.3 09/21/2018 2241   BILITOT <0.2 05/06/2018 1421   BILIDIR 0.0 05/14/2014 1223      Component Value Date/Time   TSH 0.922 05/06/2018 1421   TSH 2.160 01/23/2018 1155   TSH 0.976 08/14/2017 1152     Ref. Range 05/06/2018 14:21  Vitamin D, 25-Hydroxy Latest Ref Range: 30.0 - 100.0 ng/mL 34.0    OBESITY BEHAVIORAL INTERVENTION VISIT  Today's visit was # 24  Starting weight: 200 lbs Starting date: 08/14/2017 Today's weight : 221 lbs Today's date: 01/13/2019 Total lbs lost to date: 0    01/13/2019  Height 5\' 3"  (1.6 m)  Weight 221 lb (100.2 kg)  BMI (Calculated) 39.16  BLOOD PRESSURE - SYSTOLIC 696  BLOOD PRESSURE - DIASTOLIC 76   Body Fat % 29.5 %  Total Body Water (lbs) 84 lbs    ASK: We discussed the diagnosis of obesity with Lauryn A [place]ott today and Brandye agreed to give [name] permission to discuss obesity behavioral modification therapy today.  ASSESS: Rooney has the diagnosis of obesity and her BMI today is 39.16 Breane is in the action stage of change   ADVISE: Calianne was educated on the multiple health risks of obesity as well as the benefit of weight loss to improve her health. She was advised of the need for long term treatment and the importance of lifestyle modifications to improve her current health and to decrease her risk of future health problems.  AGREE: Multiple dietary modification options and treatment options were discussed and  Shaleka agreed to follow the recommendations documented in the above note.  ARRANGE: Natalina was educated on the importance of frequent visits to treat obesity as outlined per CMS and USPSTF  guidelines and agreed to schedule her next follow up appointment today.  I, Doreene Nest, am acting as transcriptionist for Dennard Nip, MD  I have reviewed the above documentation for accuracy and completeness, and I agree with the above. -Dennard Nip, MD

## 2019-01-17 ENCOUNTER — Other Ambulatory Visit: Payer: Self-pay

## 2019-01-17 ENCOUNTER — Ambulatory Visit (HOSPITAL_BASED_OUTPATIENT_CLINIC_OR_DEPARTMENT_OTHER)
Admission: RE | Admit: 2019-01-17 | Discharge: 2019-01-17 | Disposition: A | Discharge status: Home / Self Care | Payer: BC Managed Care – PPO | Source: Ambulatory Visit | Attending: Family Medicine | Admitting: Family Medicine

## 2019-01-17 DIAGNOSIS — R05 Cough: Secondary | ICD-10-CM | POA: Diagnosis not present

## 2019-01-19 ENCOUNTER — Other Ambulatory Visit: Payer: Self-pay | Admitting: Family Medicine

## 2019-01-19 DIAGNOSIS — R059 Cough, unspecified: Secondary | ICD-10-CM

## 2019-01-19 DIAGNOSIS — J4540 Moderate persistent asthma, uncomplicated: Secondary | ICD-10-CM

## 2019-01-19 DIAGNOSIS — R05 Cough: Secondary | ICD-10-CM

## 2019-01-23 ENCOUNTER — Encounter: Payer: Self-pay | Admitting: Family Medicine

## 2019-01-23 ENCOUNTER — Ambulatory Visit (INDEPENDENT_AMBULATORY_CARE_PROVIDER_SITE_OTHER): Payer: BC Managed Care – PPO | Admitting: Family Medicine

## 2019-01-23 ENCOUNTER — Telehealth: Payer: Self-pay | Admitting: Internal Medicine

## 2019-01-23 ENCOUNTER — Other Ambulatory Visit: Payer: Self-pay

## 2019-01-23 VITALS — BP 122/70 | HR 94 | Temp 97.8°F | Resp 18 | Ht 63.0 in | Wt 225.2 lb

## 2019-01-23 DIAGNOSIS — Z23 Encounter for immunization: Secondary | ICD-10-CM

## 2019-01-23 DIAGNOSIS — R059 Cough, unspecified: Secondary | ICD-10-CM

## 2019-01-23 DIAGNOSIS — R05 Cough: Secondary | ICD-10-CM | POA: Diagnosis not present

## 2019-01-23 DIAGNOSIS — J4540 Moderate persistent asthma, uncomplicated: Secondary | ICD-10-CM | POA: Diagnosis not present

## 2019-01-23 DIAGNOSIS — R600 Localized edema: Secondary | ICD-10-CM | POA: Insufficient documentation

## 2019-01-23 MED ORDER — ALBUTEROL SULFATE HFA 108 (90 BASE) MCG/ACT IN AERS: 2.0000 | INHALATION_SPRAY | Freq: Four times a day (QID) | RESPIRATORY_TRACT | 2 refills | Status: DC | PRN

## 2019-01-23 MED ORDER — HYDROCHLOROTHIAZIDE 25 MG PO TABS: 25.0000 mg | ORAL_TABLET | Freq: Every day | ORAL | 3 refills | Status: DC

## 2019-01-23 MED ORDER — PROMETHAZINE-DM 6.25-15 MG/5ML PO SYRP: 5.0000 mL | ORAL_SOLUTION | Freq: Four times a day (QID) | ORAL | 0 refills | Status: DC | PRN

## 2019-01-23 NOTE — Assessment & Plan Note (Signed)
con't symbicort and add proair Refer back to allergy/ asthma specialist Xray reviewed with patient

## 2019-01-23 NOTE — Assessment & Plan Note (Signed)
Elevate legs hctz daily F/u 2 weeks

## 2019-01-23 NOTE — Telephone Encounter (Signed)
Pt requested samples of Linzess 72.

## 2019-01-23 NOTE — Progress Notes (Signed)
Patient ID: Morgan Long, female    DOB: 08-15-1970  Age: 48 y.o. MRN: [id number]    Subjective:  Subjective  HPI Morgan Long presents for low ext swelling and Morgan Long wanted to go over Morgan Long cxr  Morgan Long is still struggling with Morgan Long asthma   Review of Systems  Constitutional: Negative for appetite change, diaphoresis, fatigue and unexpected weight change.  Eyes: Negative for pain, redness and visual disturbance.  Respiratory: Positive for cough and wheezing. Negative for chest tightness and shortness of breath.   Cardiovascular: Positive for leg swelling. Negative for chest pain and palpitations.  Endocrine: Negative for cold intolerance, heat intolerance, polydipsia, polyphagia and polyuria.  Genitourinary: Negative for difficulty urinating, dysuria and frequency.  Neurological: Negative for dizziness, light-headedness, numbness and headaches.    History Past Medical History:  Diagnosis Date   Allergic rhinitis    Asthma    Chronic migraine    Dr. Catalina Gravel   Constipation    Dry eye syndrome of both lacrimal glands    Dry skin    Endometriosis    Fatigue    Gastroparesis    GERD (gastroesophageal reflux disease)    Headache(784.0)    occasional, dx w/ Migraines before, Topamax helps   Heartburn    History of stomach ulcers    Lactose intolerance    Localized edema    Migraine    Muscle weakness (generalized)    Nuclear cataract of both eyes    Mild   Overweight    Pain in right ankle and joints of right foot    Shortness of breath    Sinus complaint    Sinusitis    Tired    TMJ pain dysfunction syndrome    occasional    Morgan Long has a past surgical history that includes fallopian tube removed; Breast enhancement surgery (2006); endrometroisis; Augmentation mammaplasty (Bilateral, 2006); wisdoim teeth extraction; Colonoscopy; Upper gastrointestinal endoscopy; Bunionectomy; and Ankle surgery (Left, 11/16/2017).   Morgan Long family history includes  Alcoholism in Morgan Long mother; Asthma in an other family member; Breast cancer in Morgan Long maternal aunt, maternal aunt, maternal aunt, and maternal aunt; Cancer in Morgan Long father and mother; Cancer - Other (age of onset: 36) in Morgan Long mother; Colon cancer in Morgan Long paternal grandfather; Colon cancer (age of onset: 75) in Morgan Long maternal uncle; Diabetes in an other family member; Heart disease in Morgan Long father; Hypertension in Morgan Long maternal aunt; Pancreatic cancer in Morgan Long maternal aunt; Throat cancer in Morgan Long maternal uncle and mother.Morgan Long reports that Morgan Long has never smoked. Morgan Long has never used smokeless tobacco. Morgan Long reports current alcohol use. Morgan Long reports that Morgan Long does not use drugs.  Current Outpatient Medications on File Prior to Visit  Medication Sig Dispense Refill   AMBULATORY NON FORMULARY MEDICATION Medication Name: Domperidone 10 mg three times daily before meals and at bedtime 800 tablet 1   BD PEN NEEDLE NANO U/F 32G X 4 MM MISC 1 PACKAGE BY DOES NOT APPLY ROUTE 2 (TWO) TIMES DAILY. 100 each 0   benzonatate (TESSALON) 100 MG capsule Take 1-2 capsules (100-200 mg total) by mouth 3 (three) times daily as needed for cough. 21 capsule 0   budesonide-formoterol (SYMBICORT) 160-4.5 MCG/ACT inhaler Inhale 2 puffs 2 (two) times daily into the lungs. 1 Inhaler 5   cetirizine (ZYRTEC) 10 MG tablet TAKE 1 TABLET BY MOUTH DAILY 30 tablet 5   ESTROGEL 0.75 MG/1.25 GM (0.06%) topical gel Apply 1 application topically See admin instructions.  0   etonogestrel-ethinyl estradiol (NUVARING) 0.12-0.015  MG/24HR vaginal ring Place 1 each vaginally every 28 (twenty-eight) days. Insert vaginally and leave in place for 3 consecutive weeks, then remove for 1 week.      famotidine (PEPCID) 20 MG tablet Take 1 tablet (20 mg total) by mouth as needed for heartburn or indigestion. 30 tablet 3   ibuprofen (ADVIL,MOTRIN) 800 MG tablet Take 800 mg by mouth every 6 (six) hours as needed. for pain  3   linaclotide (LINZESS) 72 MCG capsule Take 1  capsule (72 mcg total) by mouth daily before breakfast. 30 capsule 3   Multiple Vitamin (MULTIVITAMIN) LIQD Take 5 mLs by mouth daily.     phentermine 15 MG capsule Take 1 capsule (15 mg total) by mouth every morning. 30 capsule 0   RESTASIS MULTIDOSE 0.05 % ophthalmic emulsion Place 1 drop into both eyes as needed.  3   rizatriptan (MAXALT-MLT) 10 MG disintegrating tablet TAKE 1 TABLET (10 MG TOTAL) BY MOUTH AS NEEDED FOR MIGRAINE. MAY REPEAT IN 2 HOURS IF NEEDED 9 tablet 11   SAXENDA 18 MG/3ML SOPN INJECT 3 MG INTO THE SKIN DAILY. (NOT COVERED) CMD 15 mL 0   Ubrogepant ([name]) 50 MG TABS Take 50 mg by mouth 2 (two) times daily as needed (1 pill at onset of migraine, may repeat in 2 hours. no more than 4 pills a day). 10 tablet 3   Vitamin D, Ergocalciferol, (DRISDOL) 1.25 MG (50000 UT) CAPS capsule Take 1 capsule (50,000 Units total) by mouth every 7 (seven) days. 4 capsule 0   No current facility-administered medications on file prior to visit.      Objective:  Objective  Physical Exam Vitals signs and nursing note reviewed.  Constitutional:      Appearance: Morgan Long is well-developed.  HENT:     Head: Normocephalic and atraumatic.  Eyes:     Conjunctiva/sclera: Conjunctivae normal.  Neck:     Musculoskeletal: Normal range of motion and neck supple.     Thyroid: No thyromegaly.     Vascular: No carotid bruit or JVD.  Cardiovascular:     Rate and Rhythm: Normal rate and regular rhythm.     Heart sounds: Normal heart sounds. No murmur.  Pulmonary:     Effort: Pulmonary effort is normal. No respiratory distress.     Breath sounds: Normal breath sounds. No wheezing, rhonchi or rales.  Chest:     Chest wall: No tenderness.  Musculoskeletal:        General: No swelling.     Right lower leg: Edema present.     Left lower leg: Edema present.  Neurological:     Mental Status: Morgan Long is alert and oriented to person, place, and time.    BP 122/70 (BP Location: Left Arm, Patient  Position: Sitting, Cuff Size: Normal)    Pulse 94    Temp 97.8 F (36.6 C) (Temporal)    Resp 18    Ht 5\' 3"  (1.6 m)    Wt 225 lb 3.2 oz (102.2 kg)    LMP 01/08/2019    SpO2 97%    BMI 39.89 kg/m  Wt Readings from Last 3 Encounters:  01/23/19 225 lb 3.2 oz (102.2 kg)  01/13/19 221 lb (100.2 kg)  09/21/18 195 lb (88.5 kg)     Lab Results  Component Value Date   WBC 12.4 (H) 09/21/2018   HGB 13.1 09/21/2018   HCT 40.8 09/21/2018   PLT 357 09/21/2018   GLUCOSE 87 09/21/2018   CHOL 191 05/06/2018  TRIG 109 05/06/2018   HDL 65 05/06/2018   LDLCALC 104 (H) 05/06/2018   ALT 22 09/21/2018   AST 20 09/21/2018   NA 135 09/21/2018   K 3.6 09/21/2018   CL 101 09/21/2018   CREATININE 0.72 09/21/2018   BUN 12 09/21/2018   CO2 26 09/21/2018   TSH 0.922 05/06/2018   HGBA1C 5.9 (H) 05/06/2018    Dg Chest 2 View  Result Date: 01/17/2019 CLINICAL DATA:  Cough. Chest heaviness for 2 months. EXAM: CHEST - 2 VIEW COMPARISON:  Radiograph 12/06/2018 FINDINGS: The cardiomediastinal contours are normal. Moderate central bronchial and interstitial thickening, slightly increased from prior. Pulmonary vasculature is normal. No consolidation, pleural effusion, or pneumothorax. No acute osseous abnormalities are seen. IMPRESSION: Moderate central bronchial and interstitial thickening, slightly increased from prior exam. This may reflect bronchitis or reactive airways disease such as asthma. Electronically Signed   By: Keith Rake M.D.   On: 01/17/2019 20:30     Assessment & Plan:  Plan  I have discontinued Perian A. Spreen's beclomethasone. I am also having Morgan Long start on albuterol and hydrochlorothiazide. Additionally, I am having Morgan Long maintain Morgan Long etonogestrel-ethinyl estradiol, multivitamin, Estrogel, Restasis Multidose, ibuprofen, budesonide-formoterol, rizatriptan, cetirizine, famotidine, linaclotide, AMBULATORY NON FORMULARY MEDICATION, Ubrogepant, Saxenda, BD Pen Needle Nano U/F, benzonatate,  Vitamin D (Ergocalciferol), phentermine, and promethazine-dextromethorphan.  Meds ordered this encounter  Medications   albuterol (PROAIR HFA) 108 (90 Base) MCG/ACT inhaler    Sig: Inhale 2 puffs into the lungs every 6 (six) hours as needed for wheezing or shortness of breath.    Dispense:  18 g    Refill:  2   hydrochlorothiazide (HYDRODIURIL) 25 MG tablet    Sig: Take 1 tablet (25 mg total) by mouth daily.    Dispense:  90 tablet    Refill:  3   promethazine-dextromethorphan (PROMETHAZINE-DM) 6.25-15 MG/5ML syrup    Sig: Take 5 mLs by mouth 4 (four) times daily as needed for cough.    Dispense:  118 mL    Refill:  0    Problem List Items Addressed This Visit      Unprioritized   Lower extremity edema    Elevate legs hctz daily F/u 2 weeks       Relevant Medications   hydrochlorothiazide (HYDRODIURIL) 25 MG tablet   Other Relevant Orders   Basic metabolic panel   Moderate persistent asthma - Primary    con't symbicort and add proair Refer back to allergy/ asthma specialist Xray reviewed with patient       Relevant Medications   albuterol (PROAIR HFA) 108 (90 Base) MCG/ACT inhaler   Other Relevant Orders   Ambulatory referral to Allergy    Other Visit Diagnoses    Cough       Relevant Medications   promethazine-dextromethorphan (PROMETHAZINE-DM) 6.25-15 MG/5ML syrup   Need for pneumococcal vaccination       Relevant Orders   Pneumococcal polysaccharide vaccine 23-valent greater than or equal to 2yo subcutaneous/IM (Completed)   Need for influenza vaccination       Relevant Orders   Flu Vaccine QUAD 6+ mos PF IM (Fluarix Quad PF) (Completed)      Follow-up: Return in about 2 weeks (around 02/06/2019), or if symptoms worsen or fail to improve, for edema.  [name], DO

## 2019-01-23 NOTE — Patient Instructions (Signed)

## 2019-01-24 NOTE — Telephone Encounter (Signed)
I have advised patient that unfortunately, we do not have any Linzess 72 mcg , however I am glad to send an rx. Patient states she has the Linzess 145 mcg but just seems to have diarrhea if she takes daily. She indicates she does not think insurance will cover any more. I advised that I will attempt a prior authorization. PA request sent through [email or web address].

## 2019-01-27 ENCOUNTER — Telehealth: Payer: Self-pay | Admitting: Internal Medicine

## 2019-01-27 ENCOUNTER — Encounter: Payer: Self-pay | Admitting: Allergy and Immunology

## 2019-01-27 ENCOUNTER — Ambulatory Visit (INDEPENDENT_AMBULATORY_CARE_PROVIDER_SITE_OTHER): Payer: BC Managed Care – PPO | Admitting: Family Medicine

## 2019-01-27 ENCOUNTER — Other Ambulatory Visit (INDEPENDENT_AMBULATORY_CARE_PROVIDER_SITE_OTHER): Payer: BC Managed Care – PPO

## 2019-01-27 ENCOUNTER — Ambulatory Visit: Payer: BC Managed Care – PPO | Admitting: Allergy and Immunology

## 2019-01-27 ENCOUNTER — Other Ambulatory Visit: Payer: Self-pay

## 2019-01-27 ENCOUNTER — Telehealth: Payer: Self-pay | Admitting: Neurology

## 2019-01-27 VITALS — BP 118/80 | HR 94 | Temp 97.7°F | Resp 16 | Ht 63.0 in

## 2019-01-27 DIAGNOSIS — R600 Localized edema: Secondary | ICD-10-CM

## 2019-01-27 DIAGNOSIS — K219 Gastro-esophageal reflux disease without esophagitis: Secondary | ICD-10-CM

## 2019-01-27 DIAGNOSIS — J3089 Other allergic rhinitis: Secondary | ICD-10-CM | POA: Diagnosis not present

## 2019-01-27 DIAGNOSIS — J4540 Moderate persistent asthma, uncomplicated: Secondary | ICD-10-CM | POA: Diagnosis not present

## 2019-01-27 LAB — BASIC METABOLIC PANEL
BUN: 14 mg/dL (ref 6–23)
CO2: 25 mEq/L (ref 19–32)
Calcium: 8.6 mg/dL (ref 8.4–10.5)
Chloride: 105 mEq/L (ref 96–112)
Creatinine, Ser: 0.79 mg/dL (ref 0.40–1.20)
GFR: 93.12 mL/min (ref >60.00)
Glucose, Bld: 101 mg/dL — ABNORMAL HIGH (ref 70–99)
Potassium: 4.4 mEq/L (ref 3.5–5.1)
Sodium: 139 mEq/L (ref 135–145)

## 2019-01-27 MED ORDER — XHANCE 93 MCG/ACT NA EXHU: 1.0000 | INHALANT_SUSPENSION | Freq: Two times a day (BID) | NASAL | 5 refills | Status: DC | PRN

## 2019-01-27 MED ORDER — FAMOTIDINE 20 MG PO TABS: 20.0000 mg | ORAL_TABLET | Freq: Two times a day (BID) | ORAL | 5 refills | Status: DC | PRN

## 2019-01-27 MED ORDER — LEVOCETIRIZINE DIHYDROCHLORIDE 5 MG PO TABS: 5.0000 mg | ORAL_TABLET | Freq: Every day | ORAL | 5 refills | Status: DC | PRN

## 2019-01-27 MED ORDER — SPIRIVA RESPIMAT 1.25 MCG/ACT IN AERS: 2.0000 | INHALATION_SPRAY | Freq: Every day | RESPIRATORY_TRACT | 5 refills | Status: DC

## 2019-01-27 NOTE — Telephone Encounter (Signed)
Brandy from [facility] called to inform that PA for Linzess was denied and that they have faxed the denial letter.

## 2019-01-27 NOTE — Assessment & Plan Note (Signed)
   A prescription has been provided for Spiriva Respimat 1.25 g, 2 inhalations daily.  Continue Symbicort 160-4.5 g, 2 inhalations twice daily, and albuterol HFA, 1 to 2 inhalations every 4-6 hours if needed.  I have also recommended using albuterol 15 minutes prior to exercise.  To maximize pulmonary deposition, a spacer has been provided along with instructions for its proper administration with an HFA inhaler.  The patient has been asked to contact me if her symptoms persist or progress. Otherwise, she may return for follow up in 2 months.

## 2019-01-27 NOTE — Progress Notes (Signed)
0 0

## 2019-01-27 NOTE — Progress Notes (Signed)
Follow-up Note  RE: Morgan Long MRN: [id number] DOB: [birth year] Date of Office Visit: 01/27/2019  Primary care provider: Carollee Herter, Alferd Apa, DO Referring provider: Carollee Herter, Alferd Apa, *  History of present illness: Morgan Long is a 48 y.o. female with persistent asthma allergic rhinitis presenting today for follow-up.  She was last seen in this clinic in November 2018.  She reports that recently she has been experiencing chest tightness and dyspnea frequently.  She knows there has been some deconditioning because of her broken ankle, but is concerned that this has more to do with asthma and deconditioning.  She has been taking Symbicort 160-4.5 g, 2 elations twice a day.  She admits that she has not been using a spacer device with her HFA inhalers.  She has also been experiencing thick postnasal drainage, irritated throat, and ocular pruritus.  She has been taking Pazeo and cetirizine as needed.  She had been taking ranitidine to control acid reflux, however it went off the market and she has not replaced this medication.  Assessment and plan: Moderate persistent asthma  A prescription has been provided for Spiriva Respimat 1.25 g, 2 inhalations daily.  Continue Symbicort 160-4.5 g, 2 inhalations twice daily, and albuterol HFA, 1 to 2 inhalations every 4-6 hours if needed.  I have also recommended using albuterol 15 minutes prior to exercise.  To maximize pulmonary deposition, a spacer has been provided along with instructions for its proper administration with an HFA inhaler.  The patient has been asked to contact me if her symptoms persist or progress. Otherwise, she may return for follow up in 2 months.  Allergic rhinitis  Continue appropriate allergen avoidance measures.  A prescription has been provided for levocetirizine, 5 mg daily as needed.  To avoid diminishing benefit with daily use (tachyphylaxis) of second generation antihistamine, consider alternating  every few months between fexofenadine (Allegra) and levocetirizine (Xyzal).  A refill prescription has been provided for Xhance nasal spray, 1-2 actuations per nostril twice daily if needed.    Nasal saline lavage (NeilMed) has been recommended as needed and prior to medicated nasal sprays along with instructions for proper administration.  The patient is scheduled to return in the near future for allergy skin testing after having been off of antihistamines for at least 3 days.    GERD (gastroesophageal reflux disease)  Appropriate reflux lifestyle modifications have been provided.  A prescription has been provided for famotidine (Pepcid) 20 mg 1-2 times daily if needed.   Meds ordered this encounter  Medications  . Tiotropium Bromide Monohydrate (SPIRIVA RESPIMAT) 1.25 MCG/ACT AERS    Sig: Inhale 2 puffs into the lungs daily.    Dispense:  4 g    Refill:  5  . levocetirizine (XYZAL) 5 MG tablet    Sig: Take 1 tablet (5 mg total) by mouth daily as needed for allergies.    Dispense:  30 tablet    Refill:  5  . famotidine (PEPCID) 20 MG tablet    Sig: Take 1 tablet (20 mg total) by mouth 2 (two) times daily as needed for heartburn or indigestion.    Dispense:  60 tablet    Refill:  5    Diagnostics: Spirometry reveals an FVC of 2.51 L and an FEV1 of 2.09 L (93% predicted) without postbronchodilator improvement. Please see scanned spirometry results for details.    Physical examination: Blood pressure 118/80, pulse 94, temperature 97.7 F (36.5 C), temperature source Temporal, resp. rate 16,  height 5\' 3"  (1.6 m), last menstrual period 01/08/2019, SpO2 98 %.  General: Alert, interactive, in no acute distress. HEENT: TMs pearly gray, turbinates moderately edematous without discharge, post-pharynx moderately erythematous. Neck: Supple without lymphadenopathy. Lungs: Clear to auscultation without wheezing, rhonchi or rales. CV: Normal S1, S2 without murmurs. Skin: Warm and dry,  without lesions or rashes.  The following portions of the patient's history were reviewed and updated as appropriate: allergies, current medications, past family history, past medical history, past social history, past surgical history and problem list.  Allergies as of 01/27/2019      Reactions   Acetaminophen Itching   Patient can take tylenol, just not tylenol#3   Hydrocodone Nausea Only   Oxycodone Itching   Phosphate Itching   sick   Codeine Itching, Nausea Only      Medication List       Accurate as of January 27, 2019  1:00 PM. If you have any questions, ask your nurse or doctor.        albuterol 108 (90 Base) MCG/ACT inhaler Commonly known as: ProAir HFA Inhale 2 puffs into the lungs every 6 (six) hours as needed for wheezing or shortness of breath.   AMBULATORY NON FORMULARY MEDICATION Medication Name: Domperidone 10 mg three times daily before meals and at bedtime   BD Pen Needle Nano U/F 32G X 4 MM Misc Generic drug: Insulin Pen Needle 1 PACKAGE BY DOES NOT APPLY ROUTE 2 (TWO) TIMES DAILY.   benzonatate 100 MG capsule Commonly known as: TESSALON Take 1-2 capsules (100-200 mg total) by mouth 3 (three) times daily as needed for cough.   bimatoprost 0.03 % ophthalmic solution Commonly known as: LATISSE APPLY ALONG UPPER EYELID MARGIN AT BASE OF EYELASHES ONCE A DAY   budesonide-formoterol 160-4.5 MCG/ACT inhaler Commonly known as: Symbicort Inhale 2 puffs 2 (two) times daily into the lungs.   cetirizine 10 MG tablet Commonly known as: ZYRTEC TAKE 1 TABLET BY MOUTH DAILY   Estrogel 0.75 MG/1.25 GM (0.06%) topical gel Generic drug: Estradiol Apply 1 application topically See admin instructions.   etonogestrel-ethinyl estradiol 0.12-0.015 MG/24HR vaginal ring Commonly known as: Leland 1 each vaginally every 28 (twenty-eight) days. Insert vaginally and leave in place for 3 consecutive weeks, then remove for 1 week.   famotidine 20 MG tablet Commonly  known as: Pepcid Take 1 tablet (20 mg total) by mouth 2 (two) times daily as needed for heartburn or indigestion. What changed: when to take this Changed by: R Edgar Frisk, MD   hydrochlorothiazide 25 MG tablet Commonly known as: HYDRODIURIL Take 1 tablet (25 mg total) by mouth daily.   ibuprofen 800 MG tablet Commonly known as: ADVIL Take 800 mg by mouth every 6 (six) hours as needed. for pain   levocetirizine 5 MG tablet Commonly known as: XYZAL Take 1 tablet (5 mg total) by mouth daily as needed for allergies. Started by: Edmonia Lynch, MD   linaclotide 72 MCG capsule Commonly known as: Linzess Take 1 capsule (72 mcg total) by mouth daily before breakfast.   meloxicam 15 MG tablet Commonly known as: MOBIC Take by mouth.   multivitamin Liqd Take 5 mLs by mouth daily.   Pazeo 0.7 % Soln Generic drug: Olopatadine HCl   phentermine 15 MG capsule Take 1 capsule (15 mg total) by mouth every morning.   PreviDent 5000 Sensitive 1.1-5 % Pste Generic drug: Sod Fluoride-Potassium Nitrate USE BEFORE BED DO NOT RINSE   promethazine-dextromethorphan 6.25-15 MG/5ML syrup Commonly  known as: PROMETHAZINE-DM Take 5 mLs by mouth 4 (four) times daily as needed for cough.   Restasis Multidose 0.05 % ophthalmic emulsion Generic drug: cycloSPORINE Place 1 drop into both eyes as needed.   rizatriptan 10 MG disintegrating tablet Commonly known as: MAXALT-MLT TAKE 1 TABLET (10 MG TOTAL) BY MOUTH AS NEEDED FOR MIGRAINE. MAY REPEAT IN 2 HOURS IF NEEDED   Saxenda 18 MG/3ML Sopn Generic drug: Liraglutide -Weight Management INJECT 3 MG INTO THE SKIN DAILY. (NOT COVERED) CMD   Spiriva Respimat 1.25 MCG/ACT Aers Generic drug: Tiotropium Bromide Monohydrate Inhale 2 puffs into the lungs daily. Started by: Edmonia Lynch, MD   Ubrogepant 50 MG Tabs Commonly known as: [name] Take 50 mg by mouth 2 (two) times daily as needed (1 pill at onset of migraine, may repeat in 2 hours. no  more than 4 pills a day).   Vitamin D (Ergocalciferol) 1.25 MG (50000 UT) Caps capsule Commonly known as: DRISDOL Take 1 capsule (50,000 Units total) by mouth every 7 (seven) days.       Allergies  Allergen Reactions  . Acetaminophen Itching    Patient can take tylenol, just not tylenol#3  . Hydrocodone Nausea Only  . Oxycodone Itching  . Phosphate Itching    sick  . Codeine Itching and Nausea Only   Review of systems: Review of systems negative except as noted in HPI / PMHx or noted below: Constitutional: Negative.  HENT: Negative.   Eyes: Negative.  Respiratory: Negative.   Cardiovascular: Negative.  Gastrointestinal: Negative.  Genitourinary: Negative.  Musculoskeletal: Negative.  Neurological: Negative.  Endo/Heme/Allergies: Negative.  Cutaneous: Negative.  Past Medical History:  Diagnosis Date  . Allergic rhinitis   . Asthma   . Chronic migraine    Dr. Catalina Gravel  . Constipation   . Dry eye syndrome of both lacrimal glands   . Dry skin   . Endometriosis   . Fatigue   . Gastroparesis   . GERD (gastroesophageal reflux disease)   . Headache(784.0)    occasional, dx w/ Migraines before, Topamax helps  . Heartburn   . History of stomach ulcers   . Lactose intolerance   . Localized edema   . Migraine   . Muscle weakness (generalized)   . Nuclear cataract of both eyes    Mild  . Overweight   . Pain in right ankle and joints of right foot   . Shortness of breath   . Sinus complaint   . Sinusitis   . Tired   . TMJ pain dysfunction syndrome    occasional    Family History  Problem Relation Age of Onset  . Throat cancer Mother   . Cancer - Other Mother 55       throat - died 25 months  . Cancer Mother   . Alcoholism Mother   . Heart disease Father   . Cancer Father   . Breast cancer Maternal Aunt        breast  . Colon cancer Maternal Uncle 7       died 25  . Breast cancer Maternal Aunt        breast  . Pancreatic cancer Maternal Aunt   .  Diabetes Other        grandmother  . Hypertension Maternal Aunt        several family members  . Throat cancer Maternal Uncle   . Asthma Other        cousin, maternal  . Breast cancer  Maternal Aunt        total of 5 aunts  . Breast cancer Maternal Aunt   . Colon cancer Paternal Grandfather   . Heart attack Neg Hx   . Rectal cancer Neg Hx   . Stomach cancer Neg Hx     Social History   Socioeconomic History  . Marital status: Single    Spouse name: Not on file  . Number of children: 0  . Years of education: BS  . Highest education level: Not on file  Occupational History  . Occupation: Paramedic, going to school    Employer: [employer]  . Occupation: Air traffic controller  Social Needs  . Financial resource strain: Not on file  . Food insecurity    Worry: Not on file    Inability: Not on file  . Transportation needs    Medical: Not on file    Non-medical: Not on file  Tobacco Use  . Smoking status: Never Smoker  . Smokeless tobacco: Never Used  Substance and Sexual Activity  . Alcohol use: Yes    Alcohol/week: 0.0 standard drinks    Comment: socially - occasional   . Drug use: No  . Sexual activity: Yes    Birth control/protection: Inserts    Comment: nuvaring  Lifestyle  . Physical activity    Days per week: Not on file    Minutes per session: Not on file  . Stress: Not on file  Relationships  . Social Herbalist on phone: Not on file    Gets together: Not on file    Attends religious service: Not on file    Active member of club or organization: Not on file    Attends meetings of clubs or organizations: Not on file    Relationship status: Not on file  . Intimate partner violence    Fear of current or ex partner: Not on file    Emotionally abused: Not on file    Physically abused: Not on file    Forced sexual activity: Not on file  Other Topics Concern  . Not on file  Social History Narrative   Household:sister and her 3 kids     Drinks occasional starbucks drink        I appreciate the opportunity to take part in Cristel's care. Please do not hesitate to contact me with questions.  Sincerely,   R. Edgar Frisk, MD

## 2019-01-27 NOTE — Assessment & Plan Note (Signed)
   Appropriate reflux lifestyle modifications have been provided.  A prescription has been provided for famotidine (Pepcid) 20 mg 1-2 times daily if needed.

## 2019-01-27 NOTE — Addendum Note (Signed)
Addended by: Herbie Drape on: 01/27/2019 02:03 PM   Modules accepted: Orders

## 2019-01-27 NOTE — Addendum Note (Signed)
Addended by: Kelle Darting A on: 01/27/2019 01:37 PM   Modules accepted: Orders

## 2019-01-27 NOTE — Telephone Encounter (Signed)
Dr Hilarie Fredrickson, patient's insurance has denied coverage of Linzess and it will be quite expensive out of pocket. She is unable to take this on a consistent basis due to coverage. She has Linzess 145 mcg but feels that dose is almost too effective.   Should we try trulance? I think that may be covered on her plan.

## 2019-01-27 NOTE — Telephone Encounter (Signed)
Started authorization request for the patients Botox through [place] [facility] [place], faxed clinicals. DW

## 2019-01-27 NOTE — Telephone Encounter (Signed)
Yes trulance 3 mg daily

## 2019-01-27 NOTE — Patient Instructions (Addendum)
Moderate persistent asthma  A prescription has been provided for Spiriva Respimat 1.25 g, 2 inhalations daily.  Continue Symbicort 160-4.5 g, 2 inhalations twice daily, and albuterol HFA, 1 to 2 inhalations every 4-6 hours if needed.  I have also recommended using albuterol 15 minutes prior to exercise.  To maximize pulmonary deposition, a spacer has been provided along with instructions for its proper administration with an HFA inhaler.  The patient has been asked to contact me if her symptoms persist or progress. Otherwise, she may return for follow up in 2 months.  Allergic rhinitis  Continue appropriate allergen avoidance measures.  A prescription has been provided for levocetirizine, 5 mg daily as needed.  To avoid diminishing benefit with daily use (tachyphylaxis) of second generation antihistamine, consider alternating every few months between fexofenadine (Allegra) and levocetirizine (Xyzal).  A refill prescription has been provided for Xhance nasal spray, 1-2 actuations per nostril twice daily if needed.    Nasal saline lavage (NeilMed) has been recommended as needed and prior to medicated nasal sprays along with instructions for proper administration.  The patient is scheduled to return in the near future for allergy skin testing after having been off of antihistamines for at least 3 days.    GERD (gastroesophageal reflux disease)  Appropriate reflux lifestyle modifications have been provided.  A prescription has been provided for famotidine (Pepcid) 20 mg 1-2 times daily if needed.   Return in 3 weeks (on 02/17/2019) for allergy skin testing - be off of antihistamines for at least 3 days prior to testing.   Lifestyle Changes for Controlling GERD  When you have GERD, stomach acid feels as if it's backing up toward your mouth. Whether or not you take medication to control your GERD, your symptoms can often be improved with lifestyle changes.   Raise Your  Head  Reflux is more likely to strike when you're lying down flat, because stomach fluid can  flow backward more easily. Raising the head of your bed 4-6 inches can help. To do this:  Slide blocks or books under the legs at the head of your bed. Or, place a wedge under  the mattress. Many foam stores can make a suitable wedge for you. The wedge  should run from your waist to the top of your head.  Don't just prop your head on several pillows. This increases pressure on your  stomach. It can make GERD worse.  Watch Your Eating Habits Certain foods may increase the acid in your stomach or relax the lower esophageal sphincter, making GERD more likely. It's best to avoid the following:  Coffee, tea, and carbonated drinks (with and without caffeine)  Fatty, fried, or spicy food  Mint, chocolate, onions, and tomatoes  Any other foods that seem to irritate your stomach or cause you pain  Relieve the Pressure  Eat smaller meals, even if you have to eat more often.  Don't lie down right after you eat. Wait a few hours for your stomach to empty.  Avoid tight belts and tight-fitting clothes.  Lose excess weight.  Tobacco and Alcohol  Avoid smoking tobacco and drinking alcohol. They can make GERD symptoms worse.

## 2019-01-27 NOTE — Assessment & Plan Note (Signed)
   Continue appropriate allergen avoidance measures.  A prescription has been provided for levocetirizine, 5 mg daily as needed.  To avoid diminishing benefit with daily use (tachyphylaxis) of second generation antihistamine, consider alternating every few months between fexofenadine (Allegra) and levocetirizine (Xyzal).  A refill prescription has been provided for Xhance nasal spray, 1-2 actuations per nostril twice daily if needed.    Nasal saline lavage (NeilMed) has been recommended as needed and prior to medicated nasal sprays along with instructions for proper administration.  The patient is scheduled to return in the near future for allergy skin testing after having been off of antihistamines for at least 3 days.

## 2019-01-28 ENCOUNTER — Telehealth: Payer: Self-pay | Admitting: *Deleted

## 2019-01-28 MED ORDER — TRULANCE 3 MG PO TABS: 1.0000 | ORAL_TABLET | Freq: Every day | ORAL | 2 refills | Status: DC

## 2019-01-28 NOTE — Telephone Encounter (Signed)
[facility] [place]  Key: [id number] - Rx #[id number] Outcome Approvedtoday Effective from 01/28/2019 through 01/27/2020. DrugTrulance 3MG  tablets Form Blue Building control surveyor Form (CB) Original Claim Info 75

## 2019-01-28 NOTE — Telephone Encounter (Signed)
I spoke to patient to advise that we will try Trulance since Linzess is denied by insurance. Patient verbalizes understanding. Trulance sent to pharmacy.

## 2019-01-28 NOTE — Telephone Encounter (Signed)
I spoke with Horris Latino who stated that due to COVID they had extended auth's through October.

## 2019-01-30 ENCOUNTER — Other Ambulatory Visit: Payer: Self-pay

## 2019-01-30 ENCOUNTER — Ambulatory Visit: Payer: BC Managed Care – PPO | Admitting: Neurology

## 2019-01-30 ENCOUNTER — Encounter: Payer: Self-pay | Admitting: Neurology

## 2019-01-30 ENCOUNTER — Telehealth: Payer: Self-pay | Admitting: Neurology

## 2019-01-30 VITALS — BP 117/73 | HR 87 | Ht 63.0 in | Wt 226.0 lb

## 2019-01-30 DIAGNOSIS — Z20822 Contact with and (suspected) exposure to covid-19: Secondary | ICD-10-CM

## 2019-01-30 DIAGNOSIS — G43719 Chronic migraine without aura, intractable, without status migrainosus: Secondary | ICD-10-CM

## 2019-01-30 MED ORDER — ONABOTULINUMTOXINA 100 UNITS IJ SOLR
200.0000 [IU] | Freq: Once | INTRAMUSCULAR | Status: AC
  Administered 2019-01-30: 200 [IU] via INTRAMUSCULAR

## 2019-01-30 NOTE — Telephone Encounter (Signed)
3 mo Botox inj  °

## 2019-01-30 NOTE — Progress Notes (Signed)
Morgan Long is a 48 year old right-handed woman with an underlying medical history of allergic rhinitis, endometriosis, TMJ problems, and neck pain, as well as obesity, who presents for her  7th Botox injection for her intractable chronic migraines.  The patient is unaccompanied today.  I last saw her on 10/24/2018 for her 6th injection, at which time she had noticed a recent flareup of her migraines.  She did not think that the [name] had helped that much.  She was a little overdue for her Botox injection at the time.  Today, 01/30/2019 (all dictated new, as well as above notes, some dictation done in note pad or Word, outside of chart, may appear as copied):   She reports that the [place] Has actually worked out well for her.  She finds it better effective for acute use than her triptan.  She is quite pleased with it.  She feels that the Botox has also lasted longer this time around.  She had no side effects thankfully peer   Written informed consent for recurrent, 3 monthly intramuscular injections with botulinum toxin for this indication has previously been obtained and scanned into the patient's electronic chart. I will re-consent if the type of botulinum toxin used or the indication for injection changes for this patient in the future. The patient is informed that we will use the same consent for her recurrent, most likely 3 monthly injections. She demonstrated understanding and voiced agreement.   I have previously talked to the patient in detail about expectations, limitations, benefits as well as potential adverse effects of botulinum toxin injections. The patient understands that the side effects include (but are not limited to): Mouth dryness, dryness of eyes, speech and swallowing difficulties, respiratory depression or problems breathing, weakness of muscles including more distant muscles than the ones injected, flu-like symptoms, myalgias, injection site reactions such as redness, itching,  swelling, pain, and infection.   200 units of botulinum toxin type A were reconstituted using preservative-free normal saline to a concentration of 10 units per 0.1 mL and drawn up into 1 mL tuberculin syringes. Lot number was [id number], expiration date Feb 2023 for both 100 unit vials, buy and bill.    O/E: BP 117/73   Pulse 87   Ht 5' 3"  (1.6 m)   Wt 226 lb (102.5 kg)   LMP 01/08/2019   BMI 40.03 kg/m    The patient was situated in a chair, sitting comfortably. After preparing the areas with 70% isopropyl alcohol and using a 26 gauge 1 1/2 inch hollow lumen recording EMG needle for the neck injections as well as a 30 gauge 1 inch needle for the facial injections, a total dose of 155 units of botulinum toxin type A in the form of Botox was injected into the muscles and the following distribution and quantities:   #1: 10 units on the right and 10 units in the left frontalis muscles, broken down in 2 sites on each side. #2: 5 units in the right and 5 units in the left corrugator muscles. #3: 15 units in the right and 15 units in the left occipitalis muscles, broken down in 3 sites on each side. #4: 20 units in the right and 20 units in the left temporalis muscles, broken down in 4 sites on each side.  #5: 15 units on the right and 15 units in the left upper trapezius muscles, broken down in 3 sites on each side. #6: 10 units in the right and 10 units in the  left splenius capitis muscles, broken down in 2 sites on each side.  #7: 2.5 units in the right and 2.5 units in the left procerus muscles.   EMG guidance was utilized for the neck injections with mild EMG activity noted, especially in the splenius capitis muscles, more so on the R than L.    A dose of 45 units out of a total dose of 200 units was discarded as unavoidable waste.    The patient tolerated the procedure well without immediate complications. She was advised to make a followup appointment for repeat injections in 3 months from  now and encouraged to call us with any interim questions, concerns, problems, or updates. She was in agreement and did not have any questions prior to leaving clinic today.   Previously:    I saw her on 07/17/2018 for her fifth injection, at which time she reported Botox had been helpful, she reported overall very good results, no side effects, her migraines had reduced by at least 7 days a month, she also reported reduction by at least 100 hours/month of her migraine headaches.  She did report that she had received cosmetic Botox injection in her eyebrow area.    I saw her on 04/10/2018 for her 4th injection, at which time she reported good results with her Botox injections in significant reduction in her headache frequency and severely overall. She denied any side effects. She was overdue by nearly 2 weeks for her injection at the time.   I saw her on 12/26/2017 for her 3rd injection, at which time she reported having had a recent increase in her headache frequency, she had recent foot surgery.   I saw her on 09/25/2017 for her second round of injections. She felt that in the week prior to the injection her headaches were more intense.    I saw her on 06/28/2017 for her initial Botox injection, at which time she received 155 units of Botox.    She was seen by her primary care physician and received a Toradol injection on 06/19/2017 as well as a prescription for Relpax.    04/25/17: Dr. Melton Alar retired. She reports a long-standing history of migraine headaches, she recalls she was about 48 years old when she started having migraines. For quite some time she had spontaneous improvement in her migraines but some 4 or 5 years ago started having more frequent migraines. She has tried and failed multiple preventative medications but does not have a list with her and prior records from Dr. Audery Amel office are not available today. She reports that his office is closed at this point. She did not get her  records from his office. Perhaps her primary care provider has records. I encouraged her to try to obtain those records as it will help [name] manage her migraines optimally. She recalls having tried Topamax. She had hair loss from some medications but is not completely sure which ones. Topamax did not help, Zonegran caused her sedation, nortriptyline was also tried but she is unsure what happened with it. Amitriptyline she may have tried as well. She did not try Depakote. She is worried about sedation side effects as she also is going to school and has to be alert throughout the day. She takes occasional nausea medicine which likely is Zofran under the tongue. She has tried Maxalt under the tongue which is helpful at times. She needs a refill on that. She has regular eye exams because of her history of dry eyes. She  used to wear a single contact but was advised to stop using it.  She has had 2 rounds of Botox injections, last time about 3 months ago as she recalls. She remembers doing okay with it. Prior to starting Botox injections she reports having about 18-20 headache days per month typically. She does not always have a typical aura but sometimes has visual blurriness and bringing in her ears to warn her about an upcoming migraine headache. She has occasional nausea that precedes the headache.   I saw her on 06/09/2015 at which time she was referred is a new patient referral for a new problem, referred by her optometrist at the time for 1 month history of blurry vision. Her exam at the time was nonfocal and reassuringly she had no significant eye related findings. I suggested further workup in the form of visual evoked potentials, blood work, and she also reported a 6 month history of feeling tired. She had no one-sided weakness, tingling or numbness. Symptoms from the past which included paresthesias had resolved completely. She was under the care of Dr. Melton Alar for migraines. She was in the process of titrating  Zonegran. She was on 225 mg each night. When she took 300 mg each night she felt too sleepy during the day. She denied any symptoms of sleep disordered breathing. She did admit not being a good sleeper. She did endorse stress as she was in school online for psychology and was also working off-and-on in Personal assistant. She reported not drinking sodas, not drinking alcohol and she reported not smoking. She did report pain with eye movements and dry eyes.  She reported no family history of multiple sclerosis or lupus. She denied joint pain with the exception of bilateral knee pain and she also reported a 50 pound weight gain in the last year. She had visual evoked potentials on 07/06/2015: Impression: The visual evoked response test above was within normal limits bilaterally. No evidence of conduction slowing was seen within the anterior visual pathways on either side on today's evaluation.   We called her with her test results. Labs from 06/09/2015 showed normal A1c, normal vitamin D level, normal ANA, normal RF, normal ESR. CRP was elevated at 15.6. 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   She had a brain MRI with and without contrast on 06/23/2015: IMPRESSION:  This is a normal MRI of the brain with and without contrast  In addition, personally reviewed the images through the PACS system. We called her with her test results.   I first met her on 05/22/2014 at the request of her neurosurgeon, Dr. Kathyrn Sheriff, at which time the patient reported intermittent right arm numbness, particularly with neck position changes. I suggested blood work and EMG and nerve conduction testing of the right upper extremity. Her blood work showed elevated B12 and B6 levels, indicative of B vitamin supplementation. Hemoglobin A1c was 5.7. We called her with her test  results. She had EMG and nerve conduction testing on 06/02/2014: IMPRESSION:   Nerve conduction studies done on both upper extremities were unremarkable, without evidence of a neuropathy seen. EMG evaluation of the right upper extremity was unremarkable, without evidence of an overlying cervical radiculopathy. We called her with her test results. At the time, she reported improved symptoms.   05/22/2014: She has intermittent right arm numbness. Her  symptoms have been going on for about 6 months. She has not noted any permanent numbness and no issues elsewhere. She does not have any significant weakness and sometimes feels weak when the numbness seems to come on. It goes away if she changes positions or adjusts her neck position. She has had right shoulder problems and pain in the right shoulder.   You saw her on 05/14/2014 for neck pain. She had undergone physical therapy without improvement of her neck pain. She had cervical epidural steroid injections which helped for about 24 hours as understand.    She had a C-spine MRI without contrast on 01/07/2014: Mild cervical spondylosis as described above without significant disc protrusion, foraminal stenosis or central canal stenosis.    Blood work from 05/14/2014 was reviewed: She had a BMP, CBC with differential, liver function panel, lipid panel and TSH all of which were fine with the exception of a borderline LDL of 111.

## 2019-01-30 NOTE — Patient Instructions (Signed)
Please remember, botulinum toxin takes about 3-7 days to kick in. As discussed, this is not a pain shot. The purpose of the injections is to gradually improve your symptoms. In some patients it takes up to 2-3 weeks to make a difference and it wears off with time. Sometimes it may wear off before it is time for the next injection. We still should wait till the next 3 monthly injection, because injecting too frequently may cause you to develop immunity to the botulinum toxin. We are looking for a reduction in the severity and/or frequency of your symptoms. As a reminder, side effects to look out for are (but not limited to): mouth dryness, dryness of the eyes, heaviness of your head or muscle weakness, including droopy face or droopy eyelid(s), rarely: speech or swallowing difficulties and very rarely: breathing difficulties. Some people have transient neck pain or soreness which typically responds to over-the-counter anti-inflammatory medication and local heat application with a heat pad. If you think you have a severe reaction to the botulinum toxin, such as weakness, trouble speaking, trouble breathing, or trouble swallowing, you have to call 911 or have someone take you to the nearest emergency room. However, most people have either no or minimal side effects from the injections. It is normal to have a little bit of redness and swelling around the injection sites which usually improves after a few hours. Rarely, there may be a bruise that improves on its own. Most side effects reported are very mild and resolve within 10-14 days. Please feel free to call us if you have any additional questions or concerns: [phone] or email us through My Chart. We may have to adjust the dose over time, depending on your results from this injection and your overall response over time to this medication.   

## 2019-02-01 LAB — NOVEL CORONAVIRUS, NAA: SARS-CoV-2, NAA: NOT DETECTED

## 2019-02-01 LAB — SPECIMEN STATUS REPORT

## 2019-02-03 NOTE — Telephone Encounter (Signed)
I called and scheduled the patient for her injection. DW

## 2019-02-04 ENCOUNTER — Telehealth: Payer: Self-pay

## 2019-02-04 NOTE — Telephone Encounter (Signed)
Prior authorization requested for [facility]. This has been submitted via [email or web address]

## 2019-02-06 ENCOUNTER — Ambulatory Visit (INDEPENDENT_AMBULATORY_CARE_PROVIDER_SITE_OTHER): Payer: BC Managed Care – PPO | Admitting: Family Medicine

## 2019-02-06 ENCOUNTER — Ambulatory Visit: Payer: BC Managed Care – PPO | Admitting: Family Medicine

## 2019-02-06 NOTE — Telephone Encounter (Signed)
1-2 sprays per nostril bid prn. Thanks.

## 2019-02-06 NOTE — Telephone Encounter (Signed)
Directions please 

## 2019-02-06 NOTE — Telephone Encounter (Signed)
PA is pending.

## 2019-02-06 NOTE — Telephone Encounter (Signed)
PA has been denied. Patient must try/fail flunisolide first.

## 2019-02-06 NOTE — Telephone Encounter (Signed)
Dr Bobbitt please advise 

## 2019-02-06 NOTE — Telephone Encounter (Signed)
Flunisolide is fine. Thanks.

## 2019-02-07 MED ORDER — FLUNISOLIDE 25 MCG/ACT (0.025%) NA SOLN: 1.0000 | Freq: Two times a day (BID) | NASAL | 5 refills | Status: DC | PRN

## 2019-02-07 NOTE — Addendum Note (Signed)
Addended by: Lucrezia Starch I on: 02/07/2019 09:15 AM   Modules accepted: Orders

## 2019-02-07 NOTE — Telephone Encounter (Signed)
Prescription has been sent in. I did try to call the patient but was unable to reach her. I was also not able to leave a message.

## 2019-02-11 ENCOUNTER — Telehealth: Payer: Self-pay | Admitting: Internal Medicine

## 2019-02-11 NOTE — Telephone Encounter (Signed)
I have spoken to patient to advise that pharmacy indicates rx is on her profile but since it was not picked up within a 14 day period, was put back on shelf. They will get rx ready for her again. Patient verbalizes understanding.

## 2019-02-11 NOTE — Telephone Encounter (Signed)
Pt called stating that she was going to be prescribed an alternative to Linzess, she called her pharmacy [facility] on [address], they states that there was no prescription for pt.

## 2019-02-12 NOTE — Telephone Encounter (Signed)
Called patient again. Unable to lvm.

## 2019-02-14 NOTE — Telephone Encounter (Signed)
Called patient again. I am going to complete this message and will await call back from patient if she has questions.

## 2019-02-17 ENCOUNTER — Ambulatory Visit: Payer: BC Managed Care – PPO | Admitting: Allergy and Immunology

## 2019-02-17 ENCOUNTER — Ambulatory Visit (INDEPENDENT_AMBULATORY_CARE_PROVIDER_SITE_OTHER): Payer: BC Managed Care – PPO | Admitting: Family Medicine

## 2019-02-20 ENCOUNTER — Other Ambulatory Visit: Payer: Self-pay

## 2019-02-20 ENCOUNTER — Encounter: Payer: BC Managed Care – PPO | Admitting: Family Medicine

## 2019-02-21 ENCOUNTER — Other Ambulatory Visit: Payer: Self-pay | Admitting: Family Medicine

## 2019-02-21 ENCOUNTER — Ambulatory Visit (INDEPENDENT_AMBULATORY_CARE_PROVIDER_SITE_OTHER): Payer: BC Managed Care – PPO | Admitting: Family Medicine

## 2019-02-21 ENCOUNTER — Encounter: Payer: Self-pay | Admitting: Family Medicine

## 2019-02-21 ENCOUNTER — Other Ambulatory Visit: Payer: Self-pay

## 2019-02-21 DIAGNOSIS — R600 Localized edema: Secondary | ICD-10-CM

## 2019-02-21 NOTE — Progress Notes (Signed)
Virtual Visit via Video Note  I connected with Morgan Long on 02/21/19 at  3:40 PM EDT by a video enabled telemedicine application and verified that I am speaking with the correct person using two identifiers.  Location: Patient: home  Provider: office    I discussed the limitations of evaluation and management by telemedicine and the availability of in person appointments. The patient expressed understanding and agreed to proceed.  History of Present Illness: Pt is home with no complaints.  She is f/u on edema-- the hctz is working well    Observations/Objective: No vitals obtained Pt in NAD No edema  Assessment and Plan: 1. Lower extremity edema Improved con't hctz prn  F/u 3-6 months    Follow Up Instructions:    I discussed the assessment and treatment plan with the patient. The patient was provided an opportunity to ask questions and all were answered. The patient agreed with the plan and demonstrated an understanding of the instructions.   The patient was advised to call back or seek an in-person evaluation if the symptoms worsen or if the condition fails to improve as anticipated.  I provided 15 minutes of non-face-to-face time during this encounter.   [name], DO

## 2019-02-21 NOTE — Assessment & Plan Note (Signed)
Improved con't hctz prn  F/u 3-6 months

## 2019-02-24 ENCOUNTER — Telehealth: Payer: Self-pay | Admitting: Internal Medicine

## 2019-02-24 MED ORDER — LUBIPROSTONE 8 MCG PO CAPS: 8.0000 ug | ORAL_CAPSULE | Freq: Two times a day (BID) | ORAL | 3 refills | Status: DC

## 2019-02-24 NOTE — Telephone Encounter (Signed)
Pt states that she has been taking Trulance for 2 weeks and she is still constipated. Pt states she has only passed a couple of round balls. Pt also feels the medication is causing excessive cramping and she will try to have a BM and sit for 64min to an hour and nothing happens. Please advise.

## 2019-02-24 NOTE — Telephone Encounter (Signed)
Pt reported that Trulance is causing excessive cramping and straining during BM.  Please advise.

## 2019-02-24 NOTE — Telephone Encounter (Signed)
See if amitiza 8 mcg BID with food is an option (with her insurance) This would substitute for trulance

## 2019-02-24 NOTE — Telephone Encounter (Signed)
Pt aware. Prescription sent to pharmacy.

## 2019-02-26 ENCOUNTER — Other Ambulatory Visit: Payer: Self-pay | Admitting: Internal Medicine

## 2019-02-26 ENCOUNTER — Other Ambulatory Visit: Payer: Self-pay | Admitting: Family Medicine

## 2019-02-26 DIAGNOSIS — R110 Nausea: Secondary | ICD-10-CM

## 2019-02-27 ENCOUNTER — Telehealth: Payer: Self-pay | Admitting: Internal Medicine

## 2019-02-27 DIAGNOSIS — R110 Nausea: Secondary | ICD-10-CM

## 2019-02-27 NOTE — Telephone Encounter (Signed)
Pt requested medication for nausea.  

## 2019-02-27 NOTE — Telephone Encounter (Signed)
Dr Hilarie Fredrickson, is it appropriate for patient to continue getting promethazine? PCP has already denied saying refill not appropriate.

## 2019-02-28 ENCOUNTER — Other Ambulatory Visit (INDEPENDENT_AMBULATORY_CARE_PROVIDER_SITE_OTHER): Payer: Self-pay | Admitting: Family Medicine

## 2019-02-28 DIAGNOSIS — E559 Vitamin D deficiency, unspecified: Secondary | ICD-10-CM

## 2019-03-03 MED ORDER — PROMETHAZINE HCL 25 MG PO TABS: 25.0000 mg | ORAL_TABLET | Freq: Three times a day (TID) | ORAL | 0 refills | Status: DC | PRN

## 2019-03-03 NOTE — Telephone Encounter (Signed)
Patient with known gastroparesis, okay to refill promethazine

## 2019-03-03 NOTE — Telephone Encounter (Signed)
Promethazine sent to pharmacy 

## 2019-03-06 ENCOUNTER — Encounter: Payer: Self-pay | Admitting: Pulmonary Disease

## 2019-03-06 ENCOUNTER — Other Ambulatory Visit: Payer: Self-pay

## 2019-03-06 ENCOUNTER — Ambulatory Visit: Payer: BC Managed Care – PPO | Admitting: Pulmonary Disease

## 2019-03-06 DIAGNOSIS — R0602 Shortness of breath: Secondary | ICD-10-CM

## 2019-03-06 DIAGNOSIS — R918 Other nonspecific abnormal finding of lung field: Secondary | ICD-10-CM | POA: Diagnosis not present

## 2019-03-06 NOTE — Assessment & Plan Note (Signed)
I have reviewed her serial chest x-rays dating back to 2014 which shows chronic interstitial thickening which appears to be worse on her last film from 01/2019.  This raises the question of sarcoidosis or other interstitial lung disease. There also seems to be mild hilar lymphadenopathy. We should proceed with CT chest with IV contrast to clarify she does seem to have good renal function

## 2019-03-06 NOTE — Assessment & Plan Note (Signed)
Differential diagnosis includes sarcoidosis or other ILD based on abnormal chest x-ray versus deconditioning related to weight gain after her ankle injury. Pulmonary embolism is less in the differential because this is been ongoing for 2 years

## 2019-03-06 NOTE — Progress Notes (Signed)
Subjective:     Patient ID: Morgan Long, female   DOB: [birth year], 48 y.o.   MRN: [id number]  HPI  Chief Complaint  Patient presents with  . Consult    Shortness of Breath, was issued Symbicort, Spiriva, but not doing better. Has rescue inhaler but used once last week.   48 year old never smoker presents for evaluation of shortness of breath and abnormal chest x-ray.  She reports dyspnea on exertion for about 2 years gradually worsening mostly with exertion and worse with extreme movement.  This seems to improve after she rests for a little while.  She also reports a dry cough for about a year. She reports ankle injury after a fall 2 years ago for which she required tendon repair in 11/2017 and since then she has gained 50 pounds. She has undergone evaluation by an allergist and was told that she has adult onset asthma, was given prednisone for a week followed by Symbicort and Spiriva but this has only given her limited relief.  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  She has a strong family history of breast cancer in her aunts, throat cancer in her mother was a smoker and pancreatic cancer in 1 aunt   Significant tests/ events reviewed  Spirometry 06/2015-ratio 95, FEV1 108% FVC 95% Spirometry 04/2017-ratio 87, FEV1 84%, FVC 78%-mild restriction  Spirometry 01/2019-ratio 83, FEV1 93%, FVC 90%    Past Medical History:  Diagnosis Date  . Allergic rhinitis   . Asthma   . Chronic migraine    Dr. Catalina Gravel  .  Constipation   . Dry eye syndrome of both lacrimal glands   . Dry skin   . Endometriosis   . Fatigue   . Gastroparesis   . GERD (gastroesophageal reflux disease)   . Headache(784.0)    occasional, dx w/ Migraines before, Topamax helps  . Heartburn   . History of stomach ulcers   . Lactose intolerance   . Localized edema   . Migraine   . Muscle weakness (generalized)   . Nuclear cataract of both eyes    Mild  . Overweight   . Pain in right ankle and joints of right foot   . Shortness of breath   . Sinus complaint   . Sinusitis   . Tired   . TMJ pain dysfunction syndrome    occasional    Past Surgical History:  Procedure Laterality Date  . ANKLE SURGERY Left 11/16/2017  . AUGMENTATION MAMMAPLASTY Bilateral 2006  . BREAST ENHANCEMENT SURGERY  2006  . BUNIONECTOMY    . COLONOSCOPY    . endrometroisis    . fallopian tube removed     Left  . UPPER GASTROINTESTINAL ENDOSCOPY    . wisdoim teeth extraction      Allergies  Allergen Reactions  . Acetaminophen Itching    Patient can take tylenol, just not tylenol#3  . Hydrocodone Nausea Only  . Oxycodone Itching  . Phosphate Itching    sick  . Codeine Itching and Nausea Only  Social History   Socioeconomic History  . Marital status: Single    Spouse name: Not on file  . Number of children: 0  . Years of education: BS  . Highest education level: Not on file  Occupational History  . Occupation: Paramedic, going to school    Employer: [employer]  . Occupation: Air traffic controller  Social Needs  . Financial resource strain: Not on file  . Food insecurity    Worry: Not on file    Inability: Not on file  . Transportation needs    Medical: Not on file    Non-medical: Not on file  Tobacco Use  . Smoking status: Never Smoker  . Smokeless tobacco: Never Used  Substance and Sexual Activity  . Alcohol use: Yes    Alcohol/week: 0.0 standard drinks    Comment: socially - occasional   . Drug use: No   . Sexual activity: Yes    Birth control/protection: Inserts    Comment: nuvaring  Lifestyle  . Physical activity    Days per week: Not on file    Minutes per session: Not on file  . Stress: Not on file  Relationships  . Social Herbalist on phone: Not on file    Gets together: Not on file    Attends religious service: Not on file    Active member of club or organization: Not on file    Attends meetings of clubs or organizations: Not on file    Relationship status: Not on file  . Intimate partner violence    Fear of current or ex partner: Not on file    Emotionally abused: Not on file    Physically abused: Not on file    Forced sexual activity: Not on file  Other Topics Concern  . Not on file  Social History Narrative   Household:sister and her 3 kids    Drinks occasional [employer] drink         Family History  Problem Relation Age of Onset  . Throat cancer Mother   . Cancer - Other Mother 55       throat - died 13 months  . Cancer Mother   . Alcoholism Mother   . Heart disease Father   . Cancer Father   . Breast cancer Maternal Aunt        breast  . Colon cancer Maternal Uncle 36       died 42  . Breast cancer Maternal Aunt        breast  . Pancreatic cancer Maternal Aunt   . Diabetes Other        grandmother  . Hypertension Maternal Aunt        several family members  . Throat cancer Maternal Uncle   . Asthma Other        cousin, maternal  . Breast cancer Maternal Aunt        total of 5 aunts  . Breast cancer Maternal Aunt   . Colon cancer Paternal Grandfather   . Heart attack Neg Hx   . Rectal cancer Neg Hx   . Stomach cancer Neg Hx        Review of Systems  Constitutional: Positive for fatigue.  HENT: Positive for sinus pressure.   Eyes: Positive for itching.  Respiratory: Positive for chest tightness and shortness of breath.   Cardiovascular: Negative.   Gastrointestinal: Positive for abdominal pain and constipation.  Endocrine:  Negative.  Genitourinary: Negative.   Musculoskeletal: Negative.   Skin: Negative.   Allergic/Immunologic: Positive for environmental allergies.  Neurological: Positive for headaches.  Hematological: Bruises/bleeds easily.  Psychiatric/Behavioral: Negative.        Objective:   Physical Exam  Gen. Pleasant, obese, in no distress, normal affect ENT - no pallor,icterus, no post nasal drip, class 2-3 airway Neck: No JVD, no thyromegaly, no carotid bruits Lungs: no use of accessory muscles, no dullness to percussion, decreased without rales or rhonchi  Cardiovascular: Rhythm regular, heart sounds  normal, no murmurs or gallops, no peripheral edema Abdomen: soft and non-tender, no hepatosplenomegaly, BS normal. Musculoskeletal: No deformities, no cyanosis or clubbing Neuro:  alert, non focal, no tremors

## 2019-03-06 NOTE — Patient Instructions (Addendum)
Your x-ray looks slightly abnormal with "interstitial prominence" We will proceed with CT chest with contrast to clarify -further testing based on these results  Okay to stop taking Spiriva for now Keep working on weight loss

## 2019-03-10 ENCOUNTER — Other Ambulatory Visit: Payer: Self-pay

## 2019-03-10 ENCOUNTER — Encounter: Payer: Self-pay | Admitting: Allergy and Immunology

## 2019-03-10 ENCOUNTER — Ambulatory Visit (INDEPENDENT_AMBULATORY_CARE_PROVIDER_SITE_OTHER): Payer: BC Managed Care – PPO | Admitting: Allergy and Immunology

## 2019-03-10 VITALS — BP 122/72 | HR 85 | Temp 97.8°F | Resp 18

## 2019-03-10 DIAGNOSIS — K219 Gastro-esophageal reflux disease without esophagitis: Secondary | ICD-10-CM | POA: Diagnosis not present

## 2019-03-10 DIAGNOSIS — J4540 Moderate persistent asthma, uncomplicated: Secondary | ICD-10-CM

## 2019-03-10 DIAGNOSIS — J310 Chronic rhinitis: Secondary | ICD-10-CM

## 2019-03-10 DIAGNOSIS — J4520 Mild intermittent asthma, uncomplicated: Secondary | ICD-10-CM | POA: Diagnosis not present

## 2019-03-10 MED ORDER — BREZTRI AEROSPHERE 160-9-4.8 MCG/ACT IN AERO: 2.0000 | INHALATION_SPRAY | Freq: Two times a day (BID) | RESPIRATORY_TRACT | 5 refills | Status: DC

## 2019-03-10 MED ORDER — AZELASTINE HCL 0.1 % NA SOLN: 2.0000 | Freq: Two times a day (BID) | NASAL | 5 refills | Status: DC

## 2019-03-10 MED ORDER — CARBINOXAMINE MALEATE 4 MG PO TABS: 4.0000 mg | ORAL_TABLET | Freq: Three times a day (TID) | ORAL | 5 refills | Status: DC | PRN

## 2019-03-10 NOTE — Assessment & Plan Note (Addendum)
All seasonal and perennial aeroallergen skin tests are negative despite a positive histamine control.  Intranasal steroids, intranasal antihistamines, and first generation antihistamines are effective for symptoms associated with non-allergic rhinitis, whereas second generation antihistamines such as cetirizine (Zyrtec), loratadine (Claritin) and fexofenadine (Allegra) have been found to be ineffective for this condition.  A prescription has been provided for azelastine nasal spray, one spray per nostril 1-2 times daily as needed.   Continue to use Xhance nasal spray as needed.  Nasal saline lavage (NeilMed) has been recommended as needed and prior to medicated nasal sprays along with instructions for proper administration.  A prescription has been provided for carbinoxamine maleate 4mg  every 6-8 hours as needed.

## 2019-03-10 NOTE — Progress Notes (Signed)
Follow-up Note  RE: Morgan Long MRN: [id number] DOB: [birth year] Date of Office Visit: 03/10/2019  Primary care provider: Carollee Herter, Alferd Apa, DO Referring provider: Carollee Herter, Alferd Apa, *  History of present illness: Morgan Long is a 48 y.o. female with persistent asthma and allergic rhinoconjunctivitis presenting for for follow up and allergy skin testing.  She is previously seen in this office on January 27, 2019.  She reports that she has only been taking Symbicort 160, 1 inhalation twice daily rather than 2 inhalations twice daily.  She notes that she has had some increased dyspnea recently.  She denies nocturnal awakenings due to lower respiratory symptoms.  She attempts to control thick postnasal drainage with Xhance nasal spray and/or cetirizine as needed.  Assessment and plan: Moderate persistent asthma  A prescription has been provided for Breztri 160, 2 inhalations via spacer device twice daily.  Discontinue Symbicort and Spiriva.  Continue albuterol HFA, 1 to 2 inhalations every 4-6 hours if needed and 15 minutes prior to exercise.  Subjective and objective measures of pulmonary function will be followed and the treatment plan will be adjusted accordingly.  Chronic rhinitis All seasonal and perennial aeroallergen skin tests are negative despite a positive histamine control.  Intranasal steroids, intranasal antihistamines, and first generation antihistamines are effective for symptoms associated with non-allergic rhinitis, whereas second generation antihistamines such as cetirizine (Zyrtec), loratadine (Claritin) and fexofenadine (Allegra) have been found to be ineffective for this condition.  A prescription has been provided for azelastine nasal spray, one spray per nostril 1-2 times daily as needed.   Continue to use Xhance nasal spray as needed.  Nasal saline lavage (NeilMed) has been recommended as needed and prior to medicated nasal sprays along with  instructions for proper administration.  A prescription has been provided for carbinoxamine maleate 4mg  every 6-8 hours as needed.  GERD (gastroesophageal reflux disease)  Continue appropriate reflux lifestyle modifications and famotidine (Pepcid) 20 mg 1-2 times daily if needed.   Meds ordered this encounter  Medications  . Budeson-Glycopyrrol-Formoterol (BREZTRI AEROSPHERE) 160-9-4.8 MCG/ACT AERO    Sig: Inhale 2 puffs into the lungs 2 (two) times daily.    Dispense:  10.7 g    Refill:  5  . azelastine (ASTELIN) 0.1 % nasal spray    Sig: Place 2 sprays into both nostrils 2 (two) times daily.    Dispense:  30 mL    Refill:  5  . Carbinoxamine Maleate 4 MG TABS    Sig: Take 1 tablet (4 mg total) by mouth 3 (three) times daily as needed.    Dispense:  90 tablet    Refill:  5    Diagnostics: Spirometry reveals an FVC of 2.29 L and an FEV1 of 1.81 L (81% protected) without significant postbronchodilator improvement.  Please see scanned spirometry results for details. Epicutaneous environmental testing: Negative despite a positive histamine control. Intradermal environmental testing: Negative.    Physical examination: Blood pressure 122/72, pulse 85, temperature 97.8 F (36.6 C), resp. rate 18, SpO2 99 %.  General: Alert, interactive, in no acute distress. HEENT: TMs pearly gray, turbinates moderately edematous without discharge, post-pharynx unremarkable. Neck: Supple without lymphadenopathy. Lungs: Clear to auscultation without wheezing, rhonchi or rales. CV: Normal S1, S2 without murmurs. Skin: Warm and dry, without lesions or rashes.  The following portions of the patient's history were reviewed and updated as appropriate: allergies, current medications, past family history, past medical history, past social history, past surgical history and problem list.  Current Outpatient Medications  Medication Sig Dispense Refill  . albuterol (PROAIR HFA) 108 (90 Base) MCG/ACT  inhaler Inhale 2 puffs into the lungs every 6 (six) hours as needed for wheezing or shortness of breath. 18 g 2  . AMBULATORY NON FORMULARY MEDICATION Medication Name: Domperidone 10 mg three times daily before meals and at bedtime 800 tablet 1  . BD PEN NEEDLE NANO U/F 32G X 4 MM MISC 1 PACKAGE BY DOES NOT APPLY ROUTE 2 (TWO) TIMES DAILY. 100 each 0  . bimatoprost (LATISSE) 0.03 % ophthalmic solution APPLY ALONG UPPER EYELID MARGIN AT BASE OF EYELASHES ONCE A DAY    . budesonide-formoterol (SYMBICORT) 160-4.5 MCG/ACT inhaler Inhale 2 puffs 2 (two) times daily into the lungs. 1 Inhaler 5  . cetirizine (ZYRTEC) 10 MG tablet TAKE 1 TABLET BY MOUTH DAILY 30 tablet 5  . ESTROGEL 0.75 MG/1.25 GM (0.06%) topical gel Apply 1 application topically See admin instructions.  0  . etonogestrel-ethinyl estradiol (NUVARING) 0.12-0.015 MG/24HR vaginal ring Place 1 each vaginally every 28 (twenty-eight) days. Insert vaginally and leave in place for 3 consecutive weeks, then remove for 1 week.     . flunisolide (NASALIDE) 25 MCG/ACT (0.025%) SOLN Place 1-2 sprays into the nose 2 (two) times daily as needed. 25 mL 5  . Fluticasone Propionate (XHANCE) 93 MCG/ACT EXHU Place 1-2 sprays into the nose 2 (two) times daily as needed. 32 mL 5  . hydrochlorothiazide (HYDRODIURIL) 25 MG tablet Take 1 tablet (25 mg total) by mouth daily. 90 tablet 3  . ibuprofen (ADVIL,MOTRIN) 800 MG tablet Take 800 mg by mouth every 6 (six) hours as needed. for pain  3  . levocetirizine (XYZAL) 5 MG tablet Take 1 tablet (5 mg total) by mouth daily as needed for allergies. 30 tablet 5  . meloxicam (MOBIC) 15 MG tablet Take by mouth.    . Multiple Vitamin (MULTIVITAMIN) LIQD Take 5 mLs by mouth daily.    Marland Kitchen PAZEO 0.7 % SOLN     . Plecanatide (TRULANCE) 3 MG TABS Take 1 tablet by mouth daily. Pharmacy-d/c linzess-insurance will not cover 30 tablet 2  . PREVIDENT 5000 SENSITIVE 1.1-5 % PSTE USE BEFORE BED DO NOT RINSE    . promethazine (PHENERGAN)  25 MG tablet Take 1 tablet (25 mg total) by mouth every 8 (eight) hours as needed for nausea or vomiting. 20 tablet 0  . promethazine-dextromethorphan (PROMETHAZINE-DM) 6.25-15 MG/5ML syrup Take 5 mLs by mouth 4 (four) times daily as needed for cough. 118 mL 0  . RESTASIS MULTIDOSE 0.05 % ophthalmic emulsion Place 1 drop into both eyes as needed.  3  . rizatriptan (MAXALT-MLT) 10 MG disintegrating tablet TAKE 1 TABLET (10 MG TOTAL) BY MOUTH AS NEEDED FOR MIGRAINE. MAY REPEAT IN 2 HOURS IF NEEDED 9 tablet 11  . SAXENDA 18 MG/3ML SOPN INJECT 3 MG INTO THE SKIN DAILY. (NOT COVERED) CMD 15 mL 0  . spironolactone (ALDACTONE) 25 MG tablet Take 25 mg by mouth 2 (two) times daily.    Marland Kitchen terconazole (TERAZOL 7) 0.4 % vaginal cream INSERT 1 APPLICATOFUL VAGINALLY AT BEDTIME    . Tiotropium Bromide Monohydrate (SPIRIVA RESPIMAT) 1.25 MCG/ACT AERS Inhale 2 puffs into the lungs daily. 4 g 5  . Ubrogepant ([name]) 50 MG TABS Take 50 mg by mouth 2 (two) times daily as needed (1 pill at onset of migraine, may repeat in 2 hours. no more than 4 pills a day). 10 tablet 3  . Vitamin D, Ergocalciferol, (DRISDOL)  1.25 MG (50000 UT) CAPS capsule Take 1 capsule (50,000 Units total) by mouth every 7 (seven) days. 4 capsule 0  . azelastine (ASTELIN) 0.1 % nasal spray Place 2 sprays into both nostrils 2 (two) times daily. 30 mL 5  . benzonatate (TESSALON) 100 MG capsule Take 1-2 capsules (100-200 mg total) by mouth 3 (three) times daily as needed for cough. (Patient not taking: Reported on 03/10/2019) 21 capsule 0  . Budeson-Glycopyrrol-Formoterol (BREZTRI AEROSPHERE) 160-9-4.8 MCG/ACT AERO Inhale 2 puffs into the lungs 2 (two) times daily. 10.7 g 5  . Carbinoxamine Maleate 4 MG TABS Take 1 tablet (4 mg total) by mouth 3 (three) times daily as needed. 90 tablet 5  . famotidine (PEPCID) 20 MG tablet Take 1 tablet (20 mg total) by mouth 2 (two) times daily as needed for heartburn or indigestion. (Patient not taking: Reported on  03/10/2019) 60 tablet 5  . lubiprostone (AMITIZA) 8 MCG capsule Take 1 capsule (8 mcg total) by mouth 2 (two) times daily with a meal. (Patient not taking: Reported on 03/10/2019) 60 capsule 3  . phentermine 15 MG capsule Take 1 capsule (15 mg total) by mouth every morning. (Patient not taking: Reported on 03/10/2019) 30 capsule 0   No current facility-administered medications for this visit.     Allergies  Allergen Reactions  . Acetaminophen Itching    Patient can take tylenol, just not tylenol#3  . Hydrocodone Nausea Only  . Oxycodone Itching  . Phosphate Itching    sick  . Codeine Itching and Nausea Only    I appreciate the opportunity to take part in Kimiah's care. Please do not hesitate to contact me with questions.  Sincerely,   R. Edgar Frisk, MD

## 2019-03-10 NOTE — Assessment & Plan Note (Signed)
   A prescription has been provided for Breztri 160, 2 inhalations via spacer device twice daily.  Discontinue Symbicort and Spiriva.  Continue albuterol HFA, 1 to 2 inhalations every 4-6 hours if needed and 15 minutes prior to exercise.  Subjective and objective measures of pulmonary function will be followed and the treatment plan will be adjusted accordingly.

## 2019-03-10 NOTE — Patient Instructions (Addendum)
Moderate persistent asthma  A prescription has been provided for Breztri 160, 2 inhalations via spacer device twice daily.  Discontinue Symbicort and Spiriva.  Continue albuterol HFA, 1 to 2 inhalations every 4-6 hours if needed and 15 minutes prior to exercise.  Subjective and objective measures of pulmonary function will be followed and the treatment plan will be adjusted accordingly.  Chronic rhinitis All seasonal and perennial aeroallergen skin tests are negative despite a positive histamine control.  Intranasal steroids, intranasal antihistamines, and first generation antihistamines are effective for symptoms associated with non-allergic rhinitis, whereas second generation antihistamines such as cetirizine (Zyrtec), loratadine (Claritin) and fexofenadine (Allegra) have been found to be ineffective for this condition.  A prescription has been provided for azelastine nasal spray, one spray per nostril 1-2 times daily as needed.   Continue to use Xhance nasal spray as needed.  Nasal saline lavage (NeilMed) has been recommended as needed and prior to medicated nasal sprays along with instructions for proper administration.  A prescription has been provided for carbinoxamine maleate 4mg  every 6-8 hours as needed.  GERD (gastroesophageal reflux disease)  Continue appropriate reflux lifestyle modifications and famotidine (Pepcid) 20 mg 1-2 times daily if needed.   Return in about 4 months (around 07/11/2019), or if symptoms worsen or fail to improve.

## 2019-03-11 ENCOUNTER — Encounter: Payer: Self-pay | Admitting: Allergy and Immunology

## 2019-03-11 ENCOUNTER — Telehealth: Payer: Self-pay | Admitting: Internal Medicine

## 2019-03-11 NOTE — Telephone Encounter (Signed)
Pt reported that Amitiza caused her to have abdominal cramping and heart palpitations.

## 2019-03-11 NOTE — Assessment & Plan Note (Signed)
   Continue appropriate reflux lifestyle modifications and famotidine (Pepcid) 20 mg 1-2 times daily if needed. 

## 2019-03-12 NOTE — Telephone Encounter (Signed)
Left message for pt to call back.  Pt states the amitiza did not work and gave her palpitations and abdominal cramping. Please advise.

## 2019-03-13 MED ORDER — LINACLOTIDE 72 MCG PO CAPS: 72.0000 ug | ORAL_CAPSULE | Freq: Every day | ORAL | 6 refills | Status: DC

## 2019-03-13 NOTE — Telephone Encounter (Signed)
She has tried linzess, trulance, and now [place] Starting to run out of good options for chronic constipation We could try motegrity daily or she could go back to linzess

## 2019-03-13 NOTE — Telephone Encounter (Signed)
Spoke with pt and she states she needs the linzess 72 dose sent to pharmacy. Script sent in for pt. Explained it would most likely require a prior auth letting the insurance co know that she has tried 2 other meds that did not work for her.

## 2019-03-13 NOTE — Telephone Encounter (Signed)
Ok, can try last effective dose

## 2019-03-13 NOTE — Telephone Encounter (Signed)
I think her insurance would not cover linzess unless she tried the other 2 meds and they didn't work. Pt states linzess did and would like to try that again and see if it will be covered now.

## 2019-03-18 ENCOUNTER — Other Ambulatory Visit (INDEPENDENT_AMBULATORY_CARE_PROVIDER_SITE_OTHER): Payer: Self-pay | Admitting: Family Medicine

## 2019-03-18 DIAGNOSIS — E559 Vitamin D deficiency, unspecified: Secondary | ICD-10-CM

## 2019-04-11 ENCOUNTER — Other Ambulatory Visit: Payer: Self-pay

## 2019-04-11 ENCOUNTER — Telehealth: Payer: Self-pay | Admitting: Internal Medicine

## 2019-04-11 MED ORDER — PROMETHAZINE HCL 12.5 MG PO TABS: 12.5000 mg | ORAL_TABLET | Freq: Four times a day (QID) | ORAL | 1 refills | Status: DC | PRN

## 2019-04-11 MED ORDER — LINACLOTIDE 72 MCG PO CAPS: 72.0000 ug | ORAL_CAPSULE | Freq: Every day | ORAL | 6 refills | Status: DC

## 2019-04-11 NOTE — Telephone Encounter (Signed)
Ok to prescribe Promethazine 12.5 mg every 6-8 hrs PRN nausea not better with ondansetron Thanks

## 2019-04-11 NOTE — Telephone Encounter (Signed)
Pt states this week she has had a lot of nausea. She states the zofran she has does not seem to help. She has been taking phenergan 25mg  and breaking it in half due to it making her sleepy. She is requesting a prescription be called into her pharmacy. Please advise.

## 2019-04-11 NOTE — Telephone Encounter (Signed)
Spoke with pt and she is aware, script sent to pharmacy. 

## 2019-04-14 ENCOUNTER — Telehealth: Payer: Self-pay | Admitting: Internal Medicine

## 2019-04-14 NOTE — Telephone Encounter (Signed)
Noted  

## 2019-04-15 NOTE — Telephone Encounter (Signed)
Prior authorization effective from 04/11/19-04/09/20. Reference [id number].

## 2019-04-18 ENCOUNTER — Other Ambulatory Visit: Payer: Self-pay

## 2019-04-18 ENCOUNTER — Ambulatory Visit (INDEPENDENT_AMBULATORY_CARE_PROVIDER_SITE_OTHER)
Admission: RE | Admit: 2019-04-18 | Discharge: 2019-04-18 | Disposition: A | Discharge status: Home / Self Care | Payer: BC Managed Care – PPO | Source: Ambulatory Visit | Attending: Pulmonary Disease | Admitting: Pulmonary Disease

## 2019-04-18 DIAGNOSIS — R0602 Shortness of breath: Secondary | ICD-10-CM | POA: Diagnosis not present

## 2019-04-18 MED ORDER — IOHEXOL 300 MG/ML  SOLN
80.0000 mL | Freq: Once | INTRAMUSCULAR | Status: AC | PRN
  Administered 2019-04-18: 80 mL via INTRAVENOUS

## 2019-04-21 ENCOUNTER — Other Ambulatory Visit: Payer: Self-pay

## 2019-04-21 ENCOUNTER — Ambulatory Visit: Payer: BC Managed Care – PPO | Admitting: Adult Health

## 2019-04-21 ENCOUNTER — Encounter: Payer: Self-pay | Admitting: Adult Health

## 2019-04-21 VITALS — BP 112/64 | HR 79 | Temp 98.0°F | Ht 63.0 in | Wt 227.4 lb

## 2019-04-21 DIAGNOSIS — J4540 Moderate persistent asthma, uncomplicated: Secondary | ICD-10-CM

## 2019-04-21 DIAGNOSIS — R0602 Shortness of breath: Secondary | ICD-10-CM

## 2019-04-21 NOTE — Assessment & Plan Note (Signed)
Ongoing dyspnea questionable etiology.  May be multifactorial with associated deconditioning.  CT chest showed no evidence of sarcoid or interstitial lung changes.  We will get full PFTs with [facility] on return.  Continue on asthma regimen and reevaluate on return.

## 2019-04-21 NOTE — Progress Notes (Signed)
@Patient  ID: Morgan Long, female    DOB: [birth year], 48 y.o.   MRN: [id number]  Chief Complaint  Patient presents with   Follow-up    Referring provider: [name], *  HPI: 48 year old female seen for pulmonary consult March 06, 2019 for asthma, shortness of breath and abnormal chest x-ray  TEST/EVENTS :  Spirometry 06/2015-ratio 95, FEV1 108% FVC 95% Spirometry 04/2017-ratio 87, FEV1 84%, FVC 78%-mild restriction  Spirometry 01/2019-ratio 83, FEV1 93%, FVC 90%  CT chest April 18, 2019 showed no lymphadenopathy.  No nodularity.  No acute pulmonary process noted  04/21/2019 Follow up : Asthma, shortness of breath, abnormal chest x-ray Patient returns for a 6-week follow-up.  Patient has been diagnosed with adult onset asthma by her allergist.  Initially tried on Symbicort and Spiriva but then changed to [facility] .  She admits that she has not been compliant with this she takes it on occasion.  Patient is a never smoker.  Spirometry's have shown no airflow obstruction and minimal restriction. Patient denies any cough or wheezing.  Says she gets short of breath with activity such as going upstairs. Chest x-ray earlier this year showed interstitial thickening.  Patient was set up for a CT with contrast that was normal, no acute process. Labs earlier this year showed no sign of anemia.  Eosinophils were normal range. She denies any chest pain, orthopnea, edema, calf pain, hemoptysis.  No family history of coronary disease.  Does have a family history of sarcoid  Patient reports an ankle injury in 2017 with tendon repair in 2019.  Says that she has not been active and has gained 50 pounds.      Allergies  Allergen Reactions   Acetaminophen Itching    Patient can take tylenol, just not tylenol#3   Hydrocodone Nausea Only   Oxycodone Itching   Codeine Itching and Nausea Only    Immunization History  Administered Date(s) Administered   Influenza Split  03/28/2011   Influenza Whole 05/14/2007   Influenza,inj,Quad PF,6+ Mos 04/24/2013, 02/27/2014, 02/27/2017, 03/12/2018, 01/23/2019   Pneumococcal Polysaccharide-23 01/23/2019   Td 07/08/2007   Tdap 03/12/2018    Past Medical History:  Diagnosis Date   Allergic rhinitis    Asthma    Chronic migraine    Dr. Catalina Gravel   Constipation    Dry eye syndrome of both lacrimal glands    Dry skin    Endometriosis    Fatigue    Gastroparesis    GERD (gastroesophageal reflux disease)    Headache(784.0)    occasional, dx w/ Migraines before, Topamax helps   Heartburn    History of stomach ulcers    Lactose intolerance    Localized edema    Migraine    Muscle weakness (generalized)    Nuclear cataract of both eyes    Mild   Overweight    Pain in right ankle and joints of right foot    Shortness of breath    Sinus complaint    Sinusitis    Tired    TMJ pain dysfunction syndrome    occasional    Tobacco History: Social History   Tobacco Use  Smoking Status Never Smoker  Smokeless Tobacco Never Used   Counseling given: Not Answered   Outpatient Medications Prior to Visit  Medication Sig Dispense Refill   albuterol (PROAIR HFA) 108 (90 Base) MCG/ACT inhaler Inhale 2 puffs into the lungs every 6 (six) hours as needed for wheezing or shortness of breath.  18 g 2   AMBULATORY NON FORMULARY MEDICATION Medication Name: Domperidone 10 mg three times daily before meals and at bedtime 800 tablet 1   azelastine (ASTELIN) 0.1 % nasal spray Place 2 sprays into both nostrils 2 (two) times daily. 30 mL 5   BD PEN NEEDLE NANO U/F 32G X 4 MM MISC 1 PACKAGE BY DOES NOT APPLY ROUTE 2 (TWO) TIMES DAILY. 100 each 0   benzonatate (TESSALON) 100 MG capsule Take 1-2 capsules (100-200 mg total) by mouth 3 (three) times daily as needed for cough. 21 capsule 0   bimatoprost (LATISSE) 0.03 % ophthalmic solution APPLY ALONG UPPER EYELID MARGIN AT BASE OF EYELASHES ONCE A DAY      Budeson-Glycopyrrol-Formoterol (BREZTRI AEROSPHERE) 160-9-4.8 MCG/ACT AERO Inhale 2 puffs into the lungs 2 (two) times daily. 10.7 g 5   Carbinoxamine Maleate 4 MG TABS Take 1 tablet (4 mg total) by mouth 3 (three) times daily as needed. 90 tablet 5   cetirizine (ZYRTEC) 10 MG tablet TAKE 1 TABLET BY MOUTH DAILY 30 tablet 5   ESTROGEL 0.75 MG/1.25 GM (0.06%) topical gel Apply 1 application topically See admin instructions.  0   etonogestrel-ethinyl estradiol (NUVARING) 0.12-0.015 MG/24HR vaginal ring Place 1 each vaginally every 28 (twenty-eight) days. Insert vaginally and leave in place for 3 consecutive weeks, then remove for 1 week.      famotidine (PEPCID) 20 MG tablet Take 1 tablet (20 mg total) by mouth 2 (two) times daily as needed for heartburn or indigestion. 60 tablet 5   flunisolide (NASALIDE) 25 MCG/ACT (0.025%) SOLN Place 1-2 sprays into the nose 2 (two) times daily as needed. 25 mL 5   Fluticasone Propionate (XHANCE) 93 MCG/ACT EXHU Place 1-2 sprays into the nose 2 (two) times daily as needed. 32 mL 5   hydrochlorothiazide (HYDRODIURIL) 25 MG tablet Take 1 tablet (25 mg total) by mouth daily. 90 tablet 3   ibuprofen (ADVIL,MOTRIN) 800 MG tablet Take 800 mg by mouth every 6 (six) hours as needed. for pain  3   levocetirizine (XYZAL) 5 MG tablet Take 1 tablet (5 mg total) by mouth daily as needed for allergies. 30 tablet 5   linaclotide (LINZESS) 72 MCG capsule Take 1 capsule (72 mcg total) by mouth daily before breakfast. 30 capsule 6   lubiprostone (AMITIZA) 8 MCG capsule Take 1 capsule (8 mcg total) by mouth 2 (two) times daily with a meal. 60 capsule 3   meloxicam (MOBIC) 15 MG tablet Take by mouth.     Multiple Vitamin (MULTIVITAMIN) LIQD Take 5 mLs by mouth daily.     PAZEO 0.7 % SOLN      phentermine 15 MG capsule Take 1 capsule (15 mg total) by mouth every morning. 30 capsule 0   Plecanatide (TRULANCE) 3 MG TABS Take 1 tablet by mouth daily. Pharmacy-d/c  linzess-insurance will not cover 30 tablet 2   PREVIDENT 5000 SENSITIVE 1.1-5 % PSTE USE BEFORE BED DO NOT RINSE     promethazine (PHENERGAN) 12.5 MG tablet Take 1 tablet (12.5 mg total) by mouth every 6 (six) hours as needed for nausea or vomiting. 30 tablet 1   promethazine (PHENERGAN) 25 MG tablet Take 1 tablet (25 mg total) by mouth every 8 (eight) hours as needed for nausea or vomiting. 20 tablet 0   promethazine-dextromethorphan (PROMETHAZINE-DM) 6.25-15 MG/5ML syrup Take 5 mLs by mouth 4 (four) times daily as needed for cough. 118 mL 0   RESTASIS MULTIDOSE 0.05 % ophthalmic emulsion Place 1  drop into both eyes as needed.  3   rizatriptan (MAXALT-MLT) 10 MG disintegrating tablet TAKE 1 TABLET (10 MG TOTAL) BY MOUTH AS NEEDED FOR MIGRAINE. MAY REPEAT IN 2 HOURS IF NEEDED 9 tablet 11   SAXENDA 18 MG/3ML SOPN INJECT 3 MG INTO THE SKIN DAILY. (NOT COVERED) CMD 15 mL 0   spironolactone (ALDACTONE) 25 MG tablet Take 25 mg by mouth 2 (two) times daily.     terconazole (TERAZOL 7) 0.4 % vaginal cream INSERT 1 APPLICATOFUL VAGINALLY AT BEDTIME     Ubrogepant ([name]) 50 MG TABS Take 50 mg by mouth 2 (two) times daily as needed (1 pill at onset of migraine, may repeat in 2 hours. no more than 4 pills a day). 10 tablet 3   Vitamin D, Ergocalciferol, (DRISDOL) 1.25 MG (50000 UT) CAPS capsule Take 1 capsule (50,000 Units total) by mouth every 7 (seven) days. 4 capsule 0   budesonide-formoterol (SYMBICORT) 160-4.5 MCG/ACT inhaler Inhale 2 puffs 2 (two) times daily into the lungs. (Patient not taking: Reported on 04/21/2019) 1 Inhaler 5   Tiotropium Bromide Monohydrate (SPIRIVA RESPIMAT) 1.25 MCG/ACT AERS Inhale 2 puffs into the lungs daily. (Patient not taking: Reported on 04/21/2019) 4 g 5   No facility-administered medications prior to visit.      Review of Systems:   Constitutional:   No  weight loss, night sweats,  Fevers, chills,  +fatigue, or  lassitude.  HEENT:   No headaches,   Difficulty swallowing,  Tooth/dental problems, or  Sore throat,                No sneezing, itching, ear ache, nasal congestion, post nasal drip,   CV:  No chest pain,  Orthopnea, PND, swelling in lower extremities, anasarca, dizziness, palpitations, syncope.   GI  No heartburn, indigestion, abdominal pain, nausea, vomiting, diarrhea, change in bowel habits, loss of appetite, bloody stools.   Resp: .  No chest wall deformity  Skin: no rash or lesions.  GU: no dysuria, change in color of urine, no urgency or frequency.  No flank pain, no hematuria   MS:  No joint pain or swelling.  No decreased range of motion.  No back pain.    Physical Exam  BP 112/64 (BP Location: Left Arm, Cuff Size: Large)    Pulse 79    Temp 98 F (36.7 C) (Temporal)    Ht 5\' 3"  (1.6 m)    Wt 227 lb 6.4 oz (103.1 kg)    LMP 04/11/2019    SpO2 99%    BMI 40.28 kg/m   GEN: A/Ox3; pleasant , NAD, obese per BMI   HEENT:  [place]/AT,  , NOSE-clear, THROAT-clear, no lesions, no postnasal drip or exudate noted.   NECK:  Supple w/ fair ROM; no JVD; normal carotid impulses w/o bruits; no thyromegaly or nodules palpated; no lymphadenopathy.    RESP  Clear  P & A; w/o, wheezes/ rales/ or rhonchi. no accessory muscle use, no dullness to percussion  CARD:  RRR, no m/r/g, no peripheral edema, pulses intact, no cyanosis or clubbing.  GI:   Soft & nt; nml bowel sounds; no organomegaly or masses detected.   Musco: Warm bil, no deformities or joint swelling noted.   Neuro: alert, no focal deficits noted.    Skin: Warm, no lesions or rashes    Lab Results:  CBC    Component Value Date/Time   WBC 12.4 (H) 09/21/2018 2241   RBC 4.54 09/21/2018 2241  HGB 13.1 09/21/2018 2241   HGB 12.2 01/23/2018 1155   HCT 40.8 09/21/2018 2241   HCT 37.0 01/23/2018 1155   PLT 357 09/21/2018 2241   MCV 89.9 09/21/2018 2241   MCV 88 01/23/2018 1155   MCH 28.9 09/21/2018 2241   MCHC 32.1 09/21/2018 2241   RDW 14.0 09/21/2018 2241     RDW 15.4 01/23/2018 1155   LYMPHSABS 2.4 09/21/2018 2241   LYMPHSABS 1.9 01/23/2018 1155   MONOABS 0.8 09/21/2018 2241   EOSABS 0.1 09/21/2018 2241   EOSABS 0.1 01/23/2018 1155   BASOSABS 0.1 09/21/2018 2241   BASOSABS 0.0 01/23/2018 1155    BMET    Component Value Date/Time   NA 139 01/27/2019 1338   NA 140 05/06/2018 1421   K 4.4 01/27/2019 1338   CL 105 01/27/2019 1338   CO2 25 01/27/2019 1338   GLUCOSE 101 (H) 01/27/2019 1338   BUN 14 01/27/2019 1338   BUN 14 05/06/2018 1421   CREATININE 0.79 01/27/2019 1338   CALCIUM 8.6 01/27/2019 1338   GFRNONAA >60 09/21/2018 2241   GFRAA >60 09/21/2018 2241    BNP No results found for: BNP  ProBNP No results found for: PROBNP  Imaging: Ct Chest W Contrast  Result Date: 04/18/2019 CLINICAL DATA:  Shortness of breath.  Evaluate for sarcoid. EXAM: CT CHEST WITH CONTRAST TECHNIQUE: Multidetector CT imaging of the chest was performed during intravenous contrast administration. CONTRAST:  22mL OMNIPAQUE IOHEXOL 300 MG/ML  SOLN COMPARISON:  None FINDINGS: Cardiovascular: No significant vascular findings. Normal heart size. No pericardial effusion. Mediastinum/Nodes: No enlarged mediastinal, hilar, or axillary lymph nodes. Thyroid gland, trachea, and esophagus demonstrate no significant findings. Lungs/Pleura: No pleural effusion identified. No airspace consolidation, atelectasis or pneumothorax. No significant pulmonary nodularity identified. Upper Abdomen: No acute abnormality. Musculoskeletal: No chest wall abnormality. No acute or significant osseous findings. Status post bilateral breast augmentation. IMPRESSION: 1. No thoracic findings of sarcoid identified.  Normal exam Electronically Signed   By: Kerby Moors M.D.   On: 04/18/2019 14:51      No flowsheet data found.  No results found for: NITRICOXIDE      Assessment & Plan:   Moderate persistent asthma Possible asthma although does not have classic symptoms with no  cough or wheezing.  Previous spirometry's looked essentially normal.  She has not been compliant with her inhalers for now we will get her to continue on her current regimen.  Recheck in 3 months with a full PFT and DLCO.  If on return PFTs are normal and patient has no significant change in symptoms on maximum therapy would consider de-escalation and looking for other etiology of breathing issues  Plan  Patient Instructions  Continue on BREZTRI 2 puffs Twice daily  , rinse after use.  ProAir 2 puffs every 4hrs as needed  Activity as tolerated.  Follow up in 3 months with PFTs with Dr. Elsworth Soho  And As needed         Dyspnea Ongoing dyspnea questionable etiology.  May be multifactorial with associated deconditioning.  CT chest showed no evidence of sarcoid or interstitial lung changes.  We will get full PFTs with DLCO on return.  Continue on asthma regimen and reevaluate on return.     Rexene Edison, NP 04/21/2019

## 2019-04-21 NOTE — Patient Instructions (Addendum)
Continue on BREZTRI 2 puffs Twice daily  , rinse after use.  ProAir 2 puffs every 4hrs as needed  Activity as tolerated.  Follow up in 3 months with PFTs with Dr. Elsworth Soho  And As needed

## 2019-04-21 NOTE — Assessment & Plan Note (Addendum)
Possible asthma although does not have classic symptoms with no cough or wheezing.  Previous spirometry's looked essentially normal.  She has not been compliant with her inhalers for now we will get her to continue on her current regimen.  Recheck in 3 months with a full PFT and DLCO.  If on return PFTs are normal and patient has no significant change in symptoms on maximum therapy would consider de-escalation and looking for other etiology of breathing issues  Plan  Patient Instructions  Continue on BREZTRI 2 puffs Twice daily  , rinse after use.  ProAir 2 puffs every 4hrs as needed  Activity as tolerated.  Follow up in 3 months with PFTs with Dr. Elsworth Soho  And As needed

## 2019-04-24 ENCOUNTER — Other Ambulatory Visit: Payer: Self-pay | Admitting: Obstetrics and Gynecology

## 2019-04-24 DIAGNOSIS — Z1231 Encounter for screening mammogram for malignant neoplasm of breast: Secondary | ICD-10-CM

## 2019-05-05 ENCOUNTER — Encounter: Payer: Self-pay | Admitting: Family Medicine

## 2019-05-06 ENCOUNTER — Encounter: Payer: Self-pay | Admitting: Family Medicine

## 2019-05-06 ENCOUNTER — Encounter: Payer: Self-pay | Admitting: *Deleted

## 2019-05-06 DIAGNOSIS — Z20828 Contact with and (suspected) exposure to other viral communicable diseases: Secondary | ICD-10-CM | POA: Diagnosis not present

## 2019-05-06 DIAGNOSIS — R110 Nausea: Secondary | ICD-10-CM | POA: Diagnosis not present

## 2019-05-06 DIAGNOSIS — R0982 Postnasal drip: Secondary | ICD-10-CM | POA: Diagnosis not present

## 2019-05-07 ENCOUNTER — Ambulatory Visit: Payer: BC Managed Care – PPO | Admitting: Neurology

## 2019-05-08 ENCOUNTER — Other Ambulatory Visit: Payer: Self-pay | Admitting: *Deleted

## 2019-05-08 ENCOUNTER — Other Ambulatory Visit: Payer: Self-pay

## 2019-05-08 ENCOUNTER — Encounter: Payer: Self-pay | Admitting: Neurology

## 2019-05-08 ENCOUNTER — Ambulatory Visit (INDEPENDENT_AMBULATORY_CARE_PROVIDER_SITE_OTHER): Payer: BC Managed Care – PPO | Admitting: Neurology

## 2019-05-08 VITALS — BP 126/80 | HR 88 | Ht 63.0 in | Wt 233.6 lb

## 2019-05-08 DIAGNOSIS — G43719 Chronic migraine without aura, intractable, without status migrainosus: Secondary | ICD-10-CM | POA: Diagnosis not present

## 2019-05-08 MED ORDER — ONABOTULINUMTOXINA 100 UNITS IJ SOLR
200.0000 [IU] | Freq: Once | INTRAMUSCULAR | Status: AC
  Administered 2019-05-08: 200 [IU] via INTRAMUSCULAR

## 2019-05-08 MED ORDER — UBRELVY 50 MG PO TABS: 50.0000 mg | ORAL_TABLET | Freq: Two times a day (BID) | ORAL | 3 refills | Status: DC | PRN

## 2019-05-08 NOTE — Patient Instructions (Signed)
Please remember, botulinum toxin takes about 3-7 days to kick in. As discussed, this is not a pain shot. The purpose of the injections is to gradually improve your symptoms. In some patients it takes up to 2-3 weeks to make a difference and it wears off with time. Sometimes it may wear off before it is time for the next injection. We still should wait till the next 3 monthly injection, because injecting too frequently may cause you to develop immunity to the botulinum toxin. We are looking for a reduction in the severity and/or frequency of your symptoms. As a reminder, side effects to look out for are (but not limited to): mouth dryness, dryness of the eyes, heaviness of your head or muscle weakness, including droopy face or droopy eyelid(s), rarely: speech or swallowing difficulties and very rarely: breathing difficulties. Some people have transient neck pain or soreness which typically responds to over-the-counter anti-inflammatory medication and local heat application with a heat pad. If you think you have a severe reaction to the botulinum toxin, such as weakness, trouble speaking, trouble breathing, or trouble swallowing, you have to call 911 or have someone take you to the nearest emergency room. However, most people have either no or minimal side effects from the injections. It is normal to have a little bit of redness and swelling around the injection sites which usually improves after a few hours. Rarely, there may be a bruise that improves on its own. Most side effects reported are very mild and resolve within 10-14 days. Please feel free to call us if you have any additional questions or concerns: [phone] or email us through My Chart. We may have to adjust the dose over time, depending on your results from this injection and your overall response over time to this medication.   

## 2019-05-08 NOTE — Progress Notes (Signed)
Subjective:    Patient ID: Morgan Long is a 48 y.o. female.  HPI    Review of Systems  Neurological:       Rm 2, alone.  BOTOX injections. Has migraine today.     Objective:  Neurological Exam  Physical Exam Morgan Long is a 48 year old right-handed woman with an underlying medical history of allergic rhinitis, endometriosis, TMJ problems, and neck pain, as well as obesity, who presents for her    8th Botox injection for her intractable chronic migraines.  The patient is unaccompanied today.  I last saw her on 01/30/2019 for her seventh injection, at which time she reported that the Botox injections were very effective and that the new medication [name] was more helpful for acute use than her triptan before.   Today, 05/08/2019 (all dictated new, as well as above notes, some dictation done in note pad or Word, outside of chart, may appear as copied):    She reports Having a migraine today.  She did not take the [name].  She is not sure if she has any refills.  She has a left-sided headache.  She has no new symptoms but has noted that towards the end of the injection her headache frequency increases.  Written informed consent for recurrent, 3 monthly intramuscular injections with botulinum toxin for this indication has previously been obtained and scanned into the patient's electronic chart. I will re-consent if the type of botulinum toxin used or the indication for injection changes for this patient in the future. The patient is informed that we will use the same consent for her recurrent, most likely 3 monthly injections. She demonstrated understanding and voiced agreement.   I have previously talked to the patient in detail about expectations, limitations, benefits as well as potential adverse effects of botulinum toxin injections. The patient understands that the side effects include (but are not limited to): Mouth dryness, dryness of eyes, speech and swallowing difficulties,  respiratory depression or problems breathing, weakness of muscles including more distant muscles than the ones injected, flu-like symptoms, myalgias, injection site reactions such as redness, itching, swelling, pain, and infection.   200 units of botulinum toxin type A were reconstituted using preservative-free normal saline to a concentration of 10 units per 0.1 mL and drawn up into 1 mL tuberculin syringes. Lot number was [id number], expiration date August 2023 for the 200 unit vial, buy and bill.    O/E: BP 126/80   Pulse 88   Ht 5' 3"  (1.6 m)   Wt 233 lb 9.6 oz (106 kg)   LMP 04/11/2019   BMI 41.38 kg/m :    The patient was situated in a chair, sitting comfortably. After preparing the areas with 70% isopropyl alcohol and using a 26 gauge 1 1/2 inch hollow lumen recording EMG needle for the neck injections as well as a 30 gauge 1 inch needle for the facial injections, a total dose of 155 units of botulinum toxin type A in the form of Botox was injected into the muscles and the following distribution and quantities:   #1: 10 units on the right and 10 units in the left frontalis muscles, broken down in 2 sites on each side. #2: 5 units in the right and 5 units in the left corrugator muscles. #3: 15 units in the right and 15 units in the left occipitalis muscles, broken down in 3 sites on each side. #4: 20 units in the right and 20 units in the left  temporalis muscles, broken down in 4 sites on each side.  #5: 15 units on the right and 15 units in the left upper trapezius muscles, broken down in 3 sites on each side. #6: 10 units in the right and 10 units in the left splenius capitis muscles, broken down in 2 sites on each side.  #7: 2.5 units in the right and 2.5 units in the left procerus muscles.   EMG guidance was utilized for the neck injections with mild EMG activity noted, especially in the splenius capitis muscles, more so on the R than L.    A dose of 45 units out of a total dose of  200 units was discarded as unavoidable waste.    The patient tolerated the procedure well without immediate complications. She was advised to make a followup appointment for repeat injections in 3 months from now and encouraged to call us with any interim questions, concerns, problems, or updates. She was in agreement and did not have any questions prior to leaving clinic today.   Previously:    I saw her on 10/24/2018 for her 6th injection, at which time she had noticed a recent flareup of her migraines.  She did not think that the [name] had helped that much.  She was a little overdue for her Botox injection at the time.  I saw her on 07/17/2018 for her fifth injection, at which time she reported Botox had been helpful, she reported overall very good results, no side effects, her migraines had reduced by at least 7 days a month, she also reported reduction by at least 100 hours/month of her migraine headaches.  She did report that she had received cosmetic Botox injection in her eyebrow area.    I saw her on 04/10/2018 for her 4th injection, at which time she reported good results with her Botox injections in significant reduction in her headache frequency and severely overall. She denied any side effects. She was overdue by nearly 2 weeks for her injection at the time.   I saw her on 12/26/2017 for her 3rd injection, at which time she reported having had a recent increase in her headache frequency, she had recent foot surgery.   I saw her on 09/25/2017 for her second round of injections. She felt that in the week prior to the injection her headaches were more intense.    I saw her on 06/28/2017 for her initial Botox injection, at which time she received 155 units of Botox.    She was seen by her primary care physician and received a Toradol injection on 06/19/2017 as well as a prescription for Relpax.    04/25/17: Dr. Melton Alar retired. She reports a long-standing history of migraine headaches,  she recalls she was about 48 years old when she started having migraines. For quite some time she had spontaneous improvement in her migraines but some 4 or 5 years ago started having more frequent migraines. She has tried and failed multiple preventative medications but does not have a list with her and prior records from Dr. Audery Amel office are not available today. She reports that his office is closed at this point. She did not get her records from his office. Perhaps her primary care provider has records. I encouraged her to try to obtain those records as it will help [name] manage her migraines optimally. She recalls having tried Topamax. She had hair loss from some medications but is not completely sure which ones. Topamax did not help, Zonegran  caused her sedation, nortriptyline was also tried but she is unsure what happened with it. Amitriptyline she may have tried as well. She did not try Depakote. She is worried about sedation side effects as she also is going to school and has to be alert throughout the day. She takes occasional nausea medicine which likely is Zofran under the tongue. She has tried Maxalt under the tongue which is helpful at times. She needs a refill on that. She has regular eye exams because of her history of dry eyes. She used to wear a single contact but was advised to stop using it.  She has had 2 rounds of Botox injections, last time about 3 months ago as she recalls. She remembers doing okay with it. Prior to starting Botox injections she reports having about 18-20 headache days per month typically. She does not always have a typical aura but sometimes has visual blurriness and bringing in her ears to warn her about an upcoming migraine headache. She has occasional nausea that precedes the headache.   I saw her on 06/09/2015 at which time she was referred is a new patient referral for a new problem, referred by her optometrist at the time for 1 month history of blurry vision. Her  exam at the time was nonfocal and reassuringly she had no significant eye related findings. I suggested further workup in the form of visual evoked potentials, blood work, and she also reported a 6 month history of feeling tired. She had no one-sided weakness, tingling or numbness. Symptoms from the past which included paresthesias had resolved completely. She was under the care of Dr. Melton Alar for migraines. She was in the process of titrating Zonegran. She was on 225 mg each night. When she took 300 mg each night she felt too sleepy during the day. She denied any symptoms of sleep disordered breathing. She did admit not being a good sleeper. She did endorse stress as she was in school online for psychology and was also working off-and-on in Personal assistant. She reported not drinking sodas, not drinking alcohol and she reported not smoking. She did report pain with eye movements and dry eyes.  She reported no family history of multiple sclerosis or lupus. She denied joint pain with the exception of bilateral knee pain and she also reported a 50 pound weight gain in the last year. She had visual evoked potentials on 07/06/2015: Impression: The visual evoked response test above was within normal limits bilaterally. No evidence of conduction slowing was seen within the anterior visual pathways on either side on today's evaluation.   We called her with her test results. Labs from 06/09/2015 showed normal A1c, normal vitamin D level, normal ANA, normal RF, normal ESR. CRP was elevated at 15.6. 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   She had a brain MRI with and without contrast on 06/23/2015: IMPRESSION:  This is a normal MRI of the brain with and without contrast  In addition, personally reviewed the images through the PACS system. We called her with  her test results.   I first met her on 05/22/2014 at the request of her neurosurgeon, Dr. Kathyrn Sheriff, at which time the patient reported intermittent right arm numbness, particularly with neck position changes. I suggested blood work and EMG and nerve conduction testing of the right upper extremity. Her  blood work showed elevated B12 and B6 levels, indicative of B vitamin supplementation. Hemoglobin A1c was 5.7. We called her with her test results. She had EMG and nerve conduction testing on 06/02/2014: IMPRESSION:   Nerve conduction studies done on both upper extremities were unremarkable, without evidence of a neuropathy seen. EMG evaluation of the right upper extremity was unremarkable, without evidence of an overlying cervical radiculopathy. We called her with her test results. At the time, she reported improved symptoms.   05/22/2014: She has intermittent right arm numbness. Her symptoms have been going on for about 6 months. She has not noted any permanent numbness and no issues elsewhere. She does not have any significant weakness and sometimes feels weak when the numbness seems to come on. It goes away if she changes positions or adjusts her neck position. She has had right shoulder problems and pain in the right shoulder.   You saw her on 05/14/2014 for neck pain. She had undergone physical therapy without improvement of her neck pain. She had cervical epidural steroid injections which helped for about 24 hours as understand.    She had a C-spine MRI without contrast on 01/07/2014: Mild cervical spondylosis as described above without significant disc protrusion, foraminal stenosis or central canal stenosis.    Blood work from 05/14/2014 was reviewed: She had a BMP, CBC with differential, liver function panel, lipid panel and TSH all of which were fine with the exception of a borderline LDL of 111.

## 2019-05-09 ENCOUNTER — Encounter: Payer: Self-pay | Admitting: Family Medicine

## 2019-05-12 ENCOUNTER — Other Ambulatory Visit: Payer: Self-pay

## 2019-05-12 ENCOUNTER — Ambulatory Visit (INDEPENDENT_AMBULATORY_CARE_PROVIDER_SITE_OTHER): Payer: BC Managed Care – PPO | Admitting: Family Medicine

## 2019-05-12 ENCOUNTER — Encounter: Payer: Self-pay | Admitting: Family Medicine

## 2019-05-12 VITALS — Temp 97.6°F | Ht 63.0 in | Wt 230.0 lb

## 2019-05-12 DIAGNOSIS — F419 Anxiety disorder, unspecified: Secondary | ICD-10-CM | POA: Insufficient documentation

## 2019-05-12 DIAGNOSIS — R05 Cough: Secondary | ICD-10-CM

## 2019-05-12 DIAGNOSIS — R059 Cough, unspecified: Secondary | ICD-10-CM

## 2019-05-12 DIAGNOSIS — G4700 Insomnia, unspecified: Secondary | ICD-10-CM

## 2019-05-12 MED ORDER — PROMETHAZINE HCL 12.5 MG PO TABS: 12.5000 mg | ORAL_TABLET | Freq: Four times a day (QID) | ORAL | 1 refills | Status: DC | PRN

## 2019-05-12 MED ORDER — BENZONATATE 100 MG PO CAPS: 100.0000 mg | ORAL_CAPSULE | Freq: Three times a day (TID) | ORAL | 0 refills | Status: DC | PRN

## 2019-05-12 MED ORDER — TRAZODONE HCL 50 MG PO TABS: 25.0000 mg | ORAL_TABLET | Freq: Every evening | ORAL | 3 refills | Status: DC | PRN

## 2019-05-12 NOTE — Progress Notes (Signed)
Virtual Visit via Telephone Note  I connected with Morgan Long on 05/12/19 at  4:00 PM EST by telephone and verified that I am speaking with the correct person using two identifiers.  Location: Patient: home alone  Provider: office    I discussed the limitations, risks, security and privacy concerns of performing an evaluation and management service by telephone and the availability of in person appointments. I also discussed with the patient that there may be a patient responsible charge related to this service. The patient expressed understanding and agreed to proceed.   History of Present Illness: Pt is at home and is under a lot stress at work and is being bullied but a superior   She is asking for a leave of absence.   Pt will make an appointment with a counselor     Observations/Objective: Vitals:   05/12/19 1319  Temp: 97.6 F (36.4 C)  pt is teary and extremely anxious -- very emotional while discussing her situation at work   Assessment and Plan: 1. Cough  - promethazine (PHENERGAN) 12.5 MG tablet; Take 1 tablet (12.5 mg total) by mouth every 6 (six) hours as needed for nausea or vomiting.  Dispense: 30 tablet; Refill: 1 - benzonatate (TESSALON) 100 MG capsule; Take 1-2 capsules (100-200 mg total) by mouth 3 (three) times daily as needed for cough.  Dispense: 21 capsule; Refill: 0  2. Insomnia, unspecified type  - traZODone (DESYREL) 50 MG tablet; Take 0.5-1 tablets (25-50 mg total) by mouth at bedtime as needed for sleep.  Dispense: 30 tablet; Refill: 3  3. Anxiety Refer to counselor  Pt did not any other meds at this time  Pt written out of work 05/13/19- 06/09/19  Follow Up Instructions:    I discussed the assessment and treatment plan with the patient. The patient was provided an opportunity to ask questions and all were answered. The patient agreed with the plan and demonstrated an understanding of the instructions.   The patient was advised to call back or  seek an in-person evaluation if the symptoms worsen or if the condition fails to improve as anticipated.  I provided 15 minutes of non-face-to-face time during this encounter.   [name], DO

## 2019-05-15 ENCOUNTER — Other Ambulatory Visit: Payer: Self-pay

## 2019-05-15 ENCOUNTER — Encounter: Payer: Self-pay | Admitting: Family Medicine

## 2019-05-15 ENCOUNTER — Ambulatory Visit (INDEPENDENT_AMBULATORY_CARE_PROVIDER_SITE_OTHER): Payer: BC Managed Care – PPO | Admitting: Family Medicine

## 2019-05-15 VITALS — Temp 99.8°F | Ht 63.0 in | Wt 230.0 lb

## 2019-05-15 DIAGNOSIS — J0140 Acute pansinusitis, unspecified: Secondary | ICD-10-CM

## 2019-05-15 DIAGNOSIS — Z20822 Contact with and (suspected) exposure to covid-19: Secondary | ICD-10-CM

## 2019-05-15 DIAGNOSIS — N760 Acute vaginitis: Secondary | ICD-10-CM

## 2019-05-15 MED ORDER — FLUCONAZOLE 150 MG PO TABS: ORAL_TABLET | ORAL | 0 refills | Status: DC

## 2019-05-15 MED ORDER — AMOXICILLIN-POT CLAVULANATE 875-125 MG PO TABS: 1.0000 | ORAL_TABLET | Freq: Two times a day (BID) | ORAL | 0 refills | Status: DC

## 2019-05-15 NOTE — Progress Notes (Signed)
Virtual Visit via Video Note  I connected with Kinga A Vert on 05/15/19 at 11:00 AM EST by a video enabled telemedicine application and verified that I am speaking with the correct person using two identifiers.  Location: Patient: home alone  Provider: office    I discussed the limitations of evaluation and management by telemedicine and the availability of in person appointments. The patient expressed understanding and agreed to proceed.  History of Present Illness: Pt is home c/o sinus congestion , chills and low grade fever x several days and she was exposed to a family member that tested positive for covid   Observations/Objective: Temp 99.8  No other vitals obtained  Pt is in nad  Assessment and Plan: 1. Acute non-recurrent pansinusitis abx per orders con't flonase Pt to get covid test at [facility] - amoxicillin-clavulanate (AUGMENTIN) 875-125 MG tablet; Take 1 tablet by mouth 2 (two) times daily.  Dispense: 20 tablet; Refill: 0  2. Acute vaginitis  - fluconazole (DIFLUCAN) 150 MG tablet; 1 po x1, may repeat in 3 days prn  Dispense: 2 tablet; Refill: 0  Follow Up Instructions:    I discussed the assessment and treatment plan with the patient. The patient was provided an opportunity to ask questions and all were answered. The patient agreed with the plan and demonstrated an understanding of the instructions.   The patient was advised to call back or seek an in-person evaluation if the symptoms worsen or if the condition fails to improve as anticipated.  I provided 15 minutes of non-face-to-face time during this encounter.   [name], DO

## 2019-05-16 ENCOUNTER — Encounter: Payer: Self-pay | Admitting: Family Medicine

## 2019-05-17 ENCOUNTER — Encounter: Payer: Self-pay | Admitting: Family Medicine

## 2019-05-17 DIAGNOSIS — U071 COVID-19: Secondary | ICD-10-CM

## 2019-05-17 DIAGNOSIS — M549 Dorsalgia, unspecified: Secondary | ICD-10-CM

## 2019-05-17 LAB — NOVEL CORONAVIRUS, NAA: SARS-CoV-2, NAA: DETECTED — AB

## 2019-05-18 ENCOUNTER — Encounter: Payer: Self-pay | Admitting: Family Medicine

## 2019-05-19 NOTE — Progress Notes (Signed)
This encounter was created in error - please disregard.

## 2019-05-19 NOTE — Telephone Encounter (Signed)
She should follow the directions given to her in my chart--- call phone number they gave her if she has any sob  Quarantine for at least 10 days

## 2019-05-21 ENCOUNTER — Emergency Department (HOSPITAL_BASED_OUTPATIENT_CLINIC_OR_DEPARTMENT_OTHER): Payer: BC Managed Care – PPO

## 2019-05-21 ENCOUNTER — Encounter: Payer: Self-pay | Admitting: Family Medicine

## 2019-05-21 ENCOUNTER — Encounter (HOSPITAL_BASED_OUTPATIENT_CLINIC_OR_DEPARTMENT_OTHER): Payer: Self-pay

## 2019-05-21 ENCOUNTER — Emergency Department (HOSPITAL_BASED_OUTPATIENT_CLINIC_OR_DEPARTMENT_OTHER)
Admission: EM | Admit: 2019-05-21 | Discharge: 2019-05-21 | Disposition: A | Discharge status: Home / Self Care | Payer: BC Managed Care – PPO | Attending: Emergency Medicine | Admitting: Emergency Medicine

## 2019-05-21 ENCOUNTER — Other Ambulatory Visit: Payer: Self-pay

## 2019-05-21 DIAGNOSIS — J1289 Other viral pneumonia: Secondary | ICD-10-CM | POA: Diagnosis not present

## 2019-05-21 DIAGNOSIS — Z3202 Encounter for pregnancy test, result negative: Secondary | ICD-10-CM | POA: Diagnosis not present

## 2019-05-21 DIAGNOSIS — U071 COVID-19: Secondary | ICD-10-CM | POA: Diagnosis not present

## 2019-05-21 DIAGNOSIS — Z79899 Other long term (current) drug therapy: Secondary | ICD-10-CM | POA: Diagnosis not present

## 2019-05-21 DIAGNOSIS — R0602 Shortness of breath: Secondary | ICD-10-CM | POA: Insufficient documentation

## 2019-05-21 DIAGNOSIS — J1282 Pneumonia due to coronavirus disease 2019: Secondary | ICD-10-CM

## 2019-05-21 DIAGNOSIS — J45909 Unspecified asthma, uncomplicated: Secondary | ICD-10-CM | POA: Insufficient documentation

## 2019-05-21 LAB — LACTIC ACID, PLASMA: Lactic Acid, Venous: 0.9 mmol/L (ref 0.5–1.9)

## 2019-05-21 LAB — C-REACTIVE PROTEIN: CRP: 4.2 mg/dL — ABNORMAL HIGH (ref <1.0)

## 2019-05-21 LAB — BASIC METABOLIC PANEL
Anion gap: 7 (ref 5–15)
BUN: 10 mg/dL (ref 6–20)
CO2: 23 mmol/L (ref 22–32)
Calcium: 8 mg/dL — ABNORMAL LOW (ref 8.9–10.3)
Chloride: 103 mmol/L (ref 98–111)
Creatinine, Ser: 0.92 mg/dL (ref 0.44–1.00)
GFR calc Af Amer: >60 mL/min (ref >60)
GFR calc non Af Amer: >60 mL/min (ref >60)
Glucose, Bld: 78 mg/dL (ref 70–99)
Potassium: 3.8 mmol/L (ref 3.5–5.1)
Sodium: 133 mmol/L — ABNORMAL LOW (ref 135–145)

## 2019-05-21 LAB — CBC WITH DIFFERENTIAL/PLATELET
Abs Immature Granulocytes: 0.01 10*3/uL (ref 0.00–0.07)
Basophils Absolute: 0 10*3/uL (ref 0.0–0.1)
Basophils Relative: 0 %
Eosinophils Absolute: 0 10*3/uL (ref 0.0–0.5)
Eosinophils Relative: 0 %
HCT: 41.2 % (ref 36.0–46.0)
Hemoglobin: 13.5 g/dL (ref 12.0–15.0)
Immature Granulocytes: 0 %
Lymphocytes Relative: 20 %
Lymphs Abs: 1.3 10*3/uL (ref 0.7–4.0)
MCH: 28.8 pg (ref 26.0–34.0)
MCHC: 32.8 g/dL (ref 30.0–36.0)
MCV: 87.8 fL (ref 80.0–100.0)
Monocytes Absolute: 0.4 10*3/uL (ref 0.1–1.0)
Monocytes Relative: 7 %
Neutro Abs: 4.7 10*3/uL (ref 1.7–7.7)
Neutrophils Relative %: 73 %
Platelets: 319 10*3/uL (ref 150–400)
RBC: 4.69 MIL/uL (ref 3.87–5.11)
RDW: 13.3 % (ref 11.5–15.5)
WBC: 6.5 10*3/uL (ref 4.0–10.5)
nRBC: 0 % (ref 0.0–0.2)

## 2019-05-21 LAB — PROCALCITONIN: Procalcitonin: <0.1 ng/mL

## 2019-05-21 LAB — D-DIMER, QUANTITATIVE: D-Dimer, Quant: 0.34 ug/mL-FEU (ref 0.00–0.50)

## 2019-05-21 LAB — FERRITIN: Ferritin: 169 ng/mL (ref 11–307)

## 2019-05-21 LAB — BRAIN NATRIURETIC PEPTIDE: B Natriuretic Peptide: 38.5 pg/mL (ref 0.0–100.0)

## 2019-05-21 LAB — PREGNANCY, URINE: Preg Test, Ur: NEGATIVE

## 2019-05-21 LAB — LACTATE DEHYDROGENASE: LDH: 171 U/L (ref 98–192)

## 2019-05-21 LAB — TRIGLYCERIDES: Triglycerides: 93 mg/dL (ref <150)

## 2019-05-21 LAB — FIBRINOGEN: Fibrinogen: 453 mg/dL (ref 210–475)

## 2019-05-21 LAB — TROPONIN I (HIGH SENSITIVITY): Troponin I (High Sensitivity): <2 ng/L (ref <18)

## 2019-05-21 MED ORDER — SODIUM CHLORIDE 0.9 % IV BOLUS
1000.0000 mL | Freq: Once | INTRAVENOUS | Status: AC
  Administered 2019-05-21: 1000 mL via INTRAVENOUS

## 2019-05-21 MED ORDER — ONDANSETRON HCL 4 MG/2ML IJ SOLN
INTRAMUSCULAR | Status: AC
  Administered 2019-05-21: 4 mg via INTRAVENOUS
  Filled 2019-05-21: qty 2

## 2019-05-21 MED ORDER — ONDANSETRON HCL 4 MG/2ML IJ SOLN: 4.0000 mg | Freq: Once | INTRAMUSCULAR | Status: AC

## 2019-05-21 MED ORDER — IOHEXOL 350 MG/ML SOLN
100.0000 mL | Freq: Once | INTRAVENOUS | Status: AC | PRN
  Administered 2019-05-21: 100 mL via INTRAVENOUS

## 2019-05-21 MED ORDER — IBUPROFEN 400 MG PO TABS
600.0000 mg | ORAL_TABLET | Freq: Once | ORAL | Status: AC
  Administered 2019-05-21: 600 mg via ORAL
  Filled 2019-05-21: qty 1

## 2019-05-21 NOTE — Telephone Encounter (Signed)
She is also asking for cxr----  Because she is +covid --- I believe she has to go through ER---  Double check this for me----- may kim knows the answer

## 2019-05-21 NOTE — Telephone Encounter (Signed)
Notified pt of below. She denies SOB. Ordered CXR for outpt radiology and pt has been informed of protocol to follow once she arrives.

## 2019-05-21 NOTE — Telephone Encounter (Signed)
Spoke with radiology, we can order CXR as stat on COVID + patients. They prefer that COVID pts arrive between 5 and 5:30 and call radiology from the car for further instruction regarding check in and where to go. They said if CXR is urgent, we can send pt now but she should follow the process for check in as above. Do you feel ER evaluation is best for this pt?

## 2019-05-21 NOTE — Discharge Instructions (Signed)
Take over-the-counter Mucinex as needed for cough.  Return to the emergency department if you develop worsening breathing, severe chest pain, or other new and concerning symptoms.  Isolate at home until you are symptom-free +1-week.        Infection Prevention Recommendations for Individuals Confirmed to have, or Being Evaluated for, 2019 Novel Coronavirus (COVID-19) Infection Who Receive Care at Home  Individuals who are confirmed to have, or are being evaluated for, COVID-19 should follow the prevention steps below until a healthcare provider or local or state health department says they can return to normal activities.  Stay home except to get medical care You should restrict activities outside your home, except for getting medical care. Do not go to work, school, or public areas, and do not use public transportation or taxis.  Call ahead before visiting your doctor Before your medical appointment, call the healthcare provider and tell them that you have, or are being evaluated for, COVID-19 infection. This will help the healthcare provider's office take steps to keep other people from getting infected. Ask your healthcare provider to call the local or state health department.  Monitor your symptoms Seek prompt medical attention if your illness is worsening (e.g., difficulty breathing). Before going to your medical appointment, call the healthcare provider and tell them that you have, or are being evaluated for, COVID-19 infection. Ask your healthcare provider to call the local or state health department.  Wear a facemask You should wear a facemask that covers your nose and mouth when you are in the same room with other people and when you visit a healthcare provider. People who live with or visit you should also wear a facemask while they are in the same room with you.  Separate yourself from other people in your home As much as possible, you should stay in a different room  from other people in your home. Also, you should use a separate bathroom, if available.  Avoid sharing household items You should not share dishes, drinking glasses, cups, eating utensils, towels, bedding, or other items with other people in your home. After using these items, you should wash them thoroughly with soap and water.  Cover your coughs and sneezes Cover your mouth and nose with a tissue when you cough or sneeze, or you can cough or sneeze into your sleeve. Throw used tissues in a lined trash can, and immediately wash your hands with soap and water for at least 20 seconds or use an alcohol-based hand rub.  Wash your [facility] your hands often and thoroughly with soap and water for at least 20 seconds. You can use an alcohol-based hand sanitizer if soap and water are not available and if your hands are not visibly dirty. Avoid touching your eyes, nose, and mouth with unwashed hands.   Prevention Steps for Caregivers and Household Members of Individuals Confirmed to have, or Being Evaluated for, COVID-19 Infection Being Cared for in the Home  If you live with, or provide care at home for, a person confirmed to have, or being evaluated for, COVID-19 infection please follow these guidelines to prevent infection:  Follow healthcare provider's instructions Make sure that you understand and can help the patient follow any healthcare provider instructions for all care.  Provide for the patient's basic needs You should help the patient with basic needs in the home and provide support for getting groceries, prescriptions, and other personal needs.  Monitor the patient's symptoms If they are getting sicker, call his or  her medical provider and tell them that the patient has, or is being evaluated for, COVID-19 infection. This will help the healthcare provider's office take steps to keep other people from getting infected. Ask the healthcare provider to call the local or state  health department.  Limit the number of people who have contact with the patient If possible, have only one caregiver for the patient. Other household members should stay in another home or place of residence. If this is not possible, they should stay in another room, or be separated from the patient as much as possible. Use a separate bathroom, if available. Restrict visitors who do not have an essential need to be in the home.  Keep older adults, very young children, and other sick people away from the patient Keep older adults, very young children, and those who have compromised immune systems or chronic health conditions away from the patient. This includes people with chronic heart, lung, or kidney conditions, diabetes, and cancer.  Ensure good ventilation Make sure that shared spaces in the home have good air flow, such as from an air conditioner or an opened window, weather permitting.  Wash your hands often Wash your hands often and thoroughly with soap and water for at least 20 seconds. You can use an alcohol based hand sanitizer if soap and water are not available and if your hands are not visibly dirty. Avoid touching your eyes, nose, and mouth with unwashed hands. Use disposable paper towels to dry your hands. If not available, use dedicated cloth towels and replace them when they become wet.  Wear a facemask and gloves Wear a disposable facemask at all times in the room and gloves when you touch or have contact with the patient's blood, body fluids, and/or secretions or excretions, such as sweat, saliva, sputum, nasal mucus, vomit, urine, or feces.  Ensure the mask fits over your nose and mouth tightly, and do not touch it during use. Throw out disposable facemasks and gloves after using them. Do not reuse. Wash your hands immediately after removing your facemask and gloves. If your personal clothing becomes contaminated, carefully remove clothing and launder. Wash your hands  after handling contaminated clothing. Place all used disposable facemasks, gloves, and other waste in a lined container before disposing them with other household waste. Remove gloves and wash your hands immediately after handling these items.  Do not share dishes, glasses, or other household items with the patient Avoid sharing household items. You should not share dishes, drinking glasses, cups, eating utensils, towels, bedding, or other items with a patient who is confirmed to have, or being evaluated for, COVID-19 infection. After the person uses these items, you should wash them thoroughly with soap and water.  Wash laundry thoroughly Immediately remove and wash clothes or bedding that have blood, body fluids, and/or secretions or excretions, such as sweat, saliva, sputum, nasal mucus, vomit, urine, or feces, on them. Wear gloves when handling laundry from the patient. Read and follow directions on labels of laundry or clothing items and detergent. In general, wash and dry with the warmest temperatures recommended on the label.  Clean all areas the individual has used often Clean all touchable surfaces, such as counters, tabletops, doorknobs, bathroom fixtures, toilets, phones, keyboards, tablets, and bedside tables, every day. Also, clean any surfaces that may have blood, body fluids, and/or secretions or excretions on them. Wear gloves when cleaning surfaces the patient has come in contact with. Use a diluted bleach solution (e.g., dilute bleach  with 1 part bleach and 10 parts water) or a household disinfectant with a label that says EPA-registered for coronaviruses. To make a bleach solution at home, add 1 tablespoon of bleach to 1 quart (4 cups) of water. For a larger supply, add  cup of bleach to 1 gallon (16 cups) of water. Read labels of cleaning products and follow recommendations provided on product labels. Labels contain instructions for safe and effective use of the cleaning product  including precautions you should take when applying the product, such as wearing gloves or eye protection and making sure you have good ventilation during use of the product. Remove gloves and wash hands immediately after cleaning.  Monitor yourself for signs and symptoms of illness Caregivers and household members are considered close contacts, should monitor their health, and will be asked to limit movement outside of the home to the extent possible. Follow the monitoring steps for close contacts listed on the symptom monitoring form.   ? If you have additional questions, contact your local health department or call the epidemiologist on call at [phone] (available 24/7). ? This guidance is subject to change. For the most up-to-date guidance from [facility], please refer to their website: [email or web address]

## 2019-05-21 NOTE — ED Triage Notes (Signed)
Increased SOB with intermittent chest pain under bilat breasts that radiate to left arm to back. States painful to take deep breath. Has been taking Augmentin since 12/10, +CoVID 12/10

## 2019-05-21 NOTE — Telephone Encounter (Signed)
If she is sob --- she should go to ER If not we can order cxr and sent her between 5-530 as they requested

## 2019-05-21 NOTE — Telephone Encounter (Signed)
Please see patient message from 05/17/19.

## 2019-05-21 NOTE — Addendum Note (Signed)
Addended by: Kelle Darting A on: 05/21/2019 12:50 PM   Modules accepted: Orders

## 2019-05-21 NOTE — Telephone Encounter (Signed)
Attempted to reach pt and received voicemail. Left message to return my call.

## 2019-05-21 NOTE — ED Notes (Signed)
Pt states she was scheduled for chest XR today but did not make it.

## 2019-05-21 NOTE — ED Provider Notes (Signed)
[facility] EMERGENCY DEPARTMENT Provider Note   CSN: [id number] Arrival date & time: 05/21/19  1959     History No chief complaint on file.   Morgan Long is a 48 y.o. female.  Patient is a 48 year old female with past medical history of GERD, migraines, asthma.  She presents today for evaluation of chest pain and difficulty breathing.  Patient was diagnosed with Covid 5 days ago.  This evening patient developed shortness of breath and chest pain.  She reports nonproductive cough.  She denies any pain or swelling in her legs.  She does admit to fever.  The history is provided by the patient.       Past Medical History:  Diagnosis Date  . Allergic rhinitis   . Asthma   . Chronic migraine    Dr. Catalina Gravel  . Constipation   . Dry eye syndrome of both lacrimal glands   . Dry skin   . Endometriosis   . Fatigue   . Gastroparesis   . GERD (gastroesophageal reflux disease)   . Headache(784.0)    occasional, dx w/ Migraines before, Topamax helps  . Heartburn   . History of stomach ulcers   . Lactose intolerance   . Localized edema   . Migraine   . Muscle weakness (generalized)   . Nuclear cataract of both eyes    Mild  . Overweight   . Pain in right ankle and joints of right foot   . Shortness of breath   . Sinus complaint   . Sinusitis   . Tired   . TMJ pain dysfunction syndrome    occasional    Patient Active Problem List   Diagnosis Date Noted  . Anxiety 05/12/2019  . Abnormality of lung on CXR 03/06/2019  . Moderate persistent asthma 01/23/2019  . Lower extremity edema 01/23/2019  . Acute vaginitis 10/09/2018  . Acute pain of left shoulder 03/12/2018  . Other fatigue 08/14/2017  . Dyspnea 08/14/2017  . Other insomnia 08/14/2017  . Medication management 06/12/2017  . Nonintractable headache 06/12/2017  . Gastroparesis 06/12/2017  . Severe obesity (BMI >= 40) ([place]) 04/25/2017  . Leukocytosis 03/01/2017  . Obesity (BMI 35.0-39.9 without  comorbidity) 03/01/2017  . Chronic migraine without aura without status migrainosus, not intractable 03/01/2017  . Sinusitis 07/31/2016  . Influenza-like illness 07/12/2016  . Peroneal tendinitis of left lower extremity 11/03/2015  . Pes anserine bursitis 11/03/2015  . Asthma with acute exacerbation 06/15/2015  . Acute sinusitis 06/15/2015  . PCP NOTES >>>> 02/23/2015  . GERD (gastroesophageal reflux disease) 01/13/2015  . Myalgia and myositis 07/22/2014  . 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 Paresthesias 06/02/2014  . Acute pharyngitis 05/20/2014  . Strep pharyngitis 05/20/2014  . Degenerative cervical disc 10/28/2013  . Bursitis, shoulder 09/17/2013  . Muscle spasm of back 09/11/2013  . Nonallopathic lesion of thoracic region 09/11/2013  . General medical examination 03/28/2011  . Chronic rhinitis   . TMJ PAIN 12/16/2007  . Headache(784.0) 12/16/2007    Past Surgical History:  Procedure Laterality Date  . ANKLE SURGERY Left 11/16/2017  . AUGMENTATION MAMMAPLASTY Bilateral 2006  . BREAST ENHANCEMENT SURGERY  2006  . BUNIONECTOMY    . COLONOSCOPY    . endrometroisis    . fallopian tube removed     Left  . UPPER GASTROINTESTINAL ENDOSCOPY    . wisdoim teeth extraction       OB History    Gravida  0   Para  0  Term  0   Preterm  0   AB  0   Living  0     SAB  0   TAB  0   Ectopic  0   Multiple  0   Live Births  0           Family History  Problem Relation Age of Onset  . Throat cancer Mother   . Cancer - Other Mother 55       throat - died 25 months  . Cancer Mother   . Alcoholism Mother   . Heart disease Father   . Cancer Father   . Breast cancer Maternal Aunt        breast  . Colon cancer Maternal Uncle 70       died 40  . Breast cancer Maternal Aunt        breast  . Pancreatic cancer Maternal Aunt   . Diabetes Other        grandmother  . Hypertension Maternal Aunt        several family members  . Throat cancer Maternal Uncle     . Asthma Other        cousin, maternal  . Breast cancer Maternal Aunt        total of 5 aunts  . Breast cancer Maternal Aunt   . Colon cancer Paternal Grandfather   . Heart attack Neg Hx   . Rectal cancer Neg Hx   . Stomach cancer Neg Hx     Social History   Tobacco Use  . Smoking status: Never Smoker  . Smokeless tobacco: Never Used  Substance Use Topics  . Alcohol use: Yes    Alcohol/week: 0.0 standard drinks    Comment: socially - occasional   . Drug use: No    Home Medications Prior to Admission medications   Medication Sig Start Date End Date Taking? Authorizing Provider  albuterol (PROAIR HFA) 108 (90 Base) MCG/ACT inhaler Inhale 2 puffs into the lungs every 6 (six) hours as needed for wheezing or shortness of breath. 01/23/19   Carollee Herter, Alferd Apa, DO  AMBULATORY NON FORMULARY MEDICATION Medication Name: Domperidone 10 mg three times daily before meals and at bedtime 09/12/18   Pyrtle, Lajuan Lines, MD  amoxicillin-clavulanate (AUGMENTIN) 875-125 MG tablet Take 1 tablet by mouth 2 (two) times daily. 05/15/19   [name], DO  azelastine (ASTELIN) 0.1 % nasal spray Place 2 sprays into both nostrils 2 (two) times daily. 03/10/19   Bobbitt, Sedalia Muta, MD  BD PEN NEEDLE NANO U/F 32G X 4 MM MISC 1 PACKAGE BY DOES NOT APPLY ROUTE 2 (TWO) TIMES DAILY. 12/05/18   Dennard Nip D, MD  benzonatate (TESSALON) 100 MG capsule Take 1-2 capsules (100-200 mg total) by mouth 3 (three) times daily as needed for cough. 05/12/19   Roma Schanz R, DO  bimatoprost (LATISSE) 0.03 % ophthalmic solution APPLY ALONG UPPER EYELID MARGIN AT BASE OF EYELASHES ONCE A DAY 12/03/18   [provider]  Budeson-Glycopyrrol-Formoterol (BREZTRI AEROSPHERE) 160-9-4.8 MCG/ACT AERO Inhale 2 puffs into the lungs 2 (two) times daily. 03/10/19   Bobbitt, Sedalia Muta, MD  budesonide-formoterol (SYMBICORT) 160-4.5 MCG/ACT inhaler Inhale 2 puffs 2 (two) times daily into the lungs. 04/16/17   Bobbitt,  Sedalia Muta, MD  Carbinoxamine Maleate 4 MG TABS Take 1 tablet (4 mg total) by mouth 3 (three) times daily as needed. 03/10/19   Bobbitt, Sedalia Muta, MD  cetirizine (ZYRTEC) 10  MG tablet TAKE 1 TABLET BY MOUTH DAILY 05/10/18   Carollee Herter, Yvonne R, DO  ESTROGEL 0.75 MG/1.25 GM (0.06%) topical gel Apply 1 application topically See admin instructions. 05/18/15   [provider]  etonogestrel-ethinyl estradiol (NUVARING) 0.12-0.015 MG/24HR vaginal ring Place 1 each vaginally every 28 (twenty-eight) days. Insert vaginally and leave in place for 3 consecutive weeks, then remove for 1 week.     [provider]  famotidine (PEPCID) 20 MG tablet Take 1 tablet (20 mg total) by mouth 2 (two) times daily as needed for heartburn or indigestion. 01/27/19   Bobbitt, Sedalia Muta, MD  fluconazole (DIFLUCAN) 150 MG tablet 1 po x1, may repeat in 3 days prn 05/15/19   Carollee Herter, Kendrick Fries R, DO  flunisolide (NASALIDE) 25 MCG/ACT (0.025%) SOLN Place 1-2 sprays into the nose 2 (two) times daily as needed. 02/07/19   Bobbitt, Sedalia Muta, MD  Fluticasone Propionate Truett Perna) 93 MCG/ACT EXHU Place 1-2 sprays into the nose 2 (two) times daily as needed. 01/27/19   Bobbitt, Sedalia Muta, MD  hydrochlorothiazide (HYDRODIURIL) 25 MG tablet Take 1 tablet (25 mg total) by mouth daily. 01/23/19   [name], DO  ibuprofen (ADVIL,MOTRIN) 800 MG tablet Take 800 mg by mouth every 6 (six) hours as needed. for pain 11/27/16   [provider]  levocetirizine (XYZAL) 5 MG tablet Take 1 tablet (5 mg total) by mouth daily as needed for allergies. 01/27/19   Bobbitt, Sedalia Muta, MD  linaclotide Rolan Lipa) 72 MCG capsule Take 1 capsule (72 mcg total) by mouth daily before breakfast. 04/11/19   Pyrtle, Lajuan Lines, MD  lubiprostone (AMITIZA) 8 MCG capsule Take 1 capsule (8 mcg total) by mouth 2 (two) times daily with a meal. 02/24/19   Pyrtle, Lajuan Lines, MD  meloxicam (MOBIC) 15 MG tablet Take by mouth. 01/02/19    [provider]  Multiple Vitamin (MULTIVITAMIN) LIQD Take 5 mLs by mouth daily.    [provider]  PAZEO 0.7 % SOLN  01/23/19   [provider]  phentermine 15 MG capsule Take 1 capsule (15 mg total) by mouth every morning. 01/14/19   Dennard Nip D, MD  Plecanatide (TRULANCE) 3 MG TABS Take 1 tablet by mouth daily. Pharmacy-d/c linzess-insurance will not cover 01/28/19   Pyrtle, Lajuan Lines, MD  PREVIDENT 5000 SENSITIVE 1.1-5 % PSTE USE BEFORE BED DO NOT RINSE 11/21/18   [provider]  promethazine (PHENERGAN) 12.5 MG tablet Take 1 tablet (12.5 mg total) by mouth every 6 (six) hours as needed for nausea or vomiting. 05/12/19   [name], DO  promethazine (PHENERGAN) 25 MG tablet Take 1 tablet (25 mg total) by mouth every 8 (eight) hours as needed for nausea or vomiting. 03/03/19   Pyrtle, Lajuan Lines, MD  promethazine-dextromethorphan (PROMETHAZINE-DM) 6.25-15 MG/5ML syrup Take 5 mLs by mouth 4 (four) times daily as needed for cough. 01/23/19   Roma Schanz R, DO  RESTASIS MULTIDOSE 0.05 % ophthalmic emulsion Place 1 drop into both eyes as needed. 11/25/16   [provider]  rizatriptan (MAXALT-MLT) 10 MG disintegrating tablet TAKE 1 TABLET (10 MG TOTAL) BY MOUTH AS NEEDED FOR MIGRAINE. MAY REPEAT IN 2 HOURS IF NEEDED 05/06/18   Star Age, MD  SAXENDA 18 MG/3ML SOPN INJECT 3 MG INTO THE SKIN DAILY. (NOT COVERED) CMD 12/04/18   Dennard Nip D, MD  spironolactone (ALDACTONE) 25 MG tablet Take 25 mg by mouth 2 (two) times daily. 02/11/19  [provider]  terconazole (TERAZOL 7) 0.4 % vaginal cream INSERT 1 APPLICATOFUL VAGINALLY AT BEDTIME 02/13/19   [provider]  Tiotropium Bromide Monohydrate (SPIRIVA RESPIMAT) 1.25 MCG/ACT AERS Inhale 2 puffs into the lungs daily. 01/27/19   Bobbitt, Sedalia Muta, MD  traZODone (DESYREL) 50 MG tablet Take 0.5-1 tablets (25-50 mg total) by mouth at bedtime as needed for sleep. 05/12/19   Lowne  Chase, Yvonne R, DO  Ubrogepant ([name]) 50 MG TABS Take 50 mg by mouth 2 (two) times daily as needed (1 pill at onset of migraine, may repeat in 2 hours. no more than 4 pills a day). 05/08/19   Star Age, MD  Vitamin D, Ergocalciferol, (DRISDOL) 1.25 MG (50000 UT) CAPS capsule Take 1 capsule (50,000 Units total) by mouth every 7 (seven) days. 01/14/19   Dennard Nip D, MD    Allergies    Acetaminophen, Hydrocodone, Oxycodone, and Codeine  Review of Systems   Review of Systems  All other systems reviewed and are negative.   Physical Exam Updated Vital Signs BP 130/80 (BP Location: Right Arm)   Pulse (!) 113   Temp (!) 101 F (38.3 C) (Oral)   Ht 5\' 3"  (1.6 m)   Wt 104.3 kg   SpO2 93%   BMI 40.74 kg/m   Physical Exam Vitals and nursing note reviewed.  Constitutional:      General: She is not in acute distress.    Appearance: She is well-developed. She is not diaphoretic.  HENT:     Head: Normocephalic and atraumatic.     Mouth/Throat:     Mouth: Mucous membranes are moist.  Cardiovascular:     Rate and Rhythm: Regular rhythm. Tachycardia present.     Heart sounds: No murmur. No friction rub. No gallop.   Pulmonary:     Effort: Pulmonary effort is normal. No respiratory distress.     Breath sounds: Normal breath sounds. No wheezing.  Abdominal:     General: Bowel sounds are normal. There is no distension.     Palpations: Abdomen is soft.     Tenderness: There is no abdominal tenderness.  Musculoskeletal:        General: No swelling or tenderness. Normal range of motion.     Cervical back: Normal range of motion and neck supple.     Right lower leg: No edema.     Left lower leg: No edema.  Skin:    General: Skin is warm and dry.  Neurological:     Mental Status: She is alert and oriented to person, place, and time.     ED Results / Procedures / Treatments   Labs (all labs ordered are listed, but only abnormal results are displayed) Labs Reviewed  CULTURE,  BLOOD (ROUTINE X 2)  CULTURE, BLOOD (ROUTINE X 2)  BASIC METABOLIC PANEL  CBC WITH DIFFERENTIAL/PLATELET  BRAIN NATRIURETIC PEPTIDE  LACTIC ACID, PLASMA  LACTIC ACID, PLASMA  D-DIMER, QUANTITATIVE (NOT AT [facility])  PROCALCITONIN  LACTATE DEHYDROGENASE  FERRITIN  TRIGLYCERIDES  FIBRINOGEN  C-REACTIVE PROTEIN  PREGNANCY, URINE  TROPONIN I (HIGH SENSITIVITY)    EKG EKG Interpretation  Date/Time:  Wednesday May 21 2019 20:45:42 EST Ventricular Rate:  109 PR Interval:    QRS Duration: 84 QT Interval:  322 QTC Calculation: 434 R Axis:   53 Text Interpretation: Sinus tachycardia Confirmed by Veryl Speak [phone]) on 05/21/2019 8:50:26 PM   Radiology No results found.  Procedures Procedures (including critical care time)  Medications Ordered in  ED Medications  sodium chloride 0.9 % bolus 1,000 mL (has no administration in time range)    ED Course  I have reviewed the triage vital signs and the nursing notes.  Pertinent labs & imaging results that were available during my care of the patient were reviewed by me and considered in my medical decision making (see chart for details).  Patient is a 48 year old female with history of recently diagnosed COVID-19 presenting with complaints of chest discomfort and shortness of breath.  This is worsened over the past several days.  She describes pain with deep breathing.  Patient arrived here with heart rate in the 100s and febrile with a temp of 101.  Her oxygen saturations initially were the 93 to 94% range.  Laboratory studies were obtained showing a negative D-dimer and CBC and basic metabolic that were otherwise unremarkable.  I did obtain a CT scan to rule out pulmonary embolism as the discomfort seemed to come on somewhat suddenly.  This showed no evidence for PE, but does show multifocal airspace disease consistent with viral pneumonia.  When I rechecked the patient, her heart rate is now in the 90s and oxygen  saturations are in the upper 90s.  She is not tachypneic at present and appears comfortable.  At this point, I feel as though she can safely be discharged.  I will prescribe Robitussin with codeine she can use for her cough.  She is to follow-up/return as needed.  MDM Rules/Calculators/A&P  Final Clinical Impression(s) / ED Diagnoses Final diagnoses:  None    Rx / DC Orders ED Discharge Orders    None       Veryl Speak, MD 05/21/19 2313

## 2019-05-24 ENCOUNTER — Other Ambulatory Visit: Payer: Self-pay | Admitting: Family Medicine

## 2019-05-24 DIAGNOSIS — R059 Cough, unspecified: Secondary | ICD-10-CM

## 2019-05-24 DIAGNOSIS — R05 Cough: Secondary | ICD-10-CM

## 2019-05-27 ENCOUNTER — Other Ambulatory Visit: Payer: Self-pay | Admitting: Family Medicine

## 2019-05-27 DIAGNOSIS — U071 COVID-19: Secondary | ICD-10-CM

## 2019-05-27 DIAGNOSIS — J9801 Acute bronchospasm: Secondary | ICD-10-CM

## 2019-05-27 LAB — CULTURE, BLOOD (ROUTINE X 2)
Culture: NO GROWTH
Special Requests: ADEQUATE

## 2019-05-27 MED ORDER — PREDNISONE 10 MG PO TABS: ORAL_TABLET | ORAL | 0 refills | Status: DC

## 2019-05-27 NOTE — Telephone Encounter (Signed)
I sent in pred taper Please have her go to er if this is not working

## 2019-05-27 NOTE — Progress Notes (Signed)
pre

## 2019-06-03 ENCOUNTER — Other Ambulatory Visit: Payer: Self-pay | Admitting: Family Medicine

## 2019-06-03 MED ORDER — TERCONAZOLE 0.4 % VA CREA: 1.0000 | TOPICAL_CREAM | Freq: Every day | VAGINAL | 0 refills | Status: DC

## 2019-06-03 NOTE — Telephone Encounter (Signed)
terazol 7 sent in

## 2019-06-04 ENCOUNTER — Encounter: Payer: Self-pay | Admitting: Family Medicine

## 2019-06-05 ENCOUNTER — Encounter (INDEPENDENT_AMBULATORY_CARE_PROVIDER_SITE_OTHER): Payer: Self-pay

## 2019-06-06 ENCOUNTER — Encounter (INDEPENDENT_AMBULATORY_CARE_PROVIDER_SITE_OTHER): Payer: Self-pay

## 2019-06-06 ENCOUNTER — Other Ambulatory Visit: Payer: Self-pay | Admitting: Family Medicine

## 2019-06-06 DIAGNOSIS — G4700 Insomnia, unspecified: Secondary | ICD-10-CM

## 2019-06-07 ENCOUNTER — Encounter (INDEPENDENT_AMBULATORY_CARE_PROVIDER_SITE_OTHER): Payer: Self-pay

## 2019-06-08 ENCOUNTER — Telehealth: Payer: Self-pay

## 2019-06-08 ENCOUNTER — Encounter (INDEPENDENT_AMBULATORY_CARE_PROVIDER_SITE_OTHER): Payer: Self-pay

## 2019-06-08 NOTE — Telephone Encounter (Signed)
Called pt and LM on VM to call back to discuss SOB. Sent pt information and advised on her SOB via MyChart.

## 2019-06-08 NOTE — Telephone Encounter (Signed)
Called pt and LM to call back to discuss SOB.

## 2019-06-09 ENCOUNTER — Encounter (INDEPENDENT_AMBULATORY_CARE_PROVIDER_SITE_OTHER): Payer: Self-pay

## 2019-06-11 ENCOUNTER — Telehealth: Payer: Self-pay

## 2019-06-11 NOTE — Telephone Encounter (Signed)
Thank you :)

## 2019-06-11 NOTE — Telephone Encounter (Signed)
Spoke with patient regarding symptoms. Patient continues to have occasional SOB episode (once an hour). Does not have pulse oximeter at home. Continues to have abdominal pain, afebrile. Per PCP, scheduled [facility] at [facility].

## 2019-06-13 ENCOUNTER — Ambulatory Visit (INDEPENDENT_AMBULATORY_CARE_PROVIDER_SITE_OTHER): Payer: BC Managed Care – PPO | Admitting: Internal Medicine

## 2019-06-13 VITALS — BP 120/72 | HR 113 | Temp 98.6°F | Ht 63.0 in | Wt 221.8 lb

## 2019-06-13 DIAGNOSIS — R110 Nausea: Secondary | ICD-10-CM

## 2019-06-13 DIAGNOSIS — R0602 Shortness of breath: Secondary | ICD-10-CM | POA: Diagnosis not present

## 2019-06-13 DIAGNOSIS — Z8616 Personal history of COVID-19: Secondary | ICD-10-CM

## 2019-06-13 MED ORDER — ONDANSETRON 4 MG PO TBDP: 4.0000 mg | ORAL_TABLET | Freq: Three times a day (TID) | ORAL | 0 refills | Status: DC | PRN

## 2019-06-13 MED ORDER — HYDROCODONE-HOMATROPINE 5-1.5 MG/5ML PO SYRP: 5.0000 mL | ORAL_SOLUTION | Freq: Three times a day (TID) | ORAL | 0 refills | Status: DC | PRN

## 2019-06-13 MED ORDER — ONDANSETRON HCL 4 MG/2ML IJ SOLN
4.0000 mg | Freq: Once | INTRAMUSCULAR | Status: AC
  Administered 2019-06-13: 4 mg via INTRAMUSCULAR

## 2019-06-13 NOTE — Progress Notes (Signed)
Respiratory Clinic Note   Patient's initial symptoms began on 1S2/12 with SOB and cough. Seen in ED on 12/16 for worsening SOB. Deemed stable for continued outpatient management.  Covid testing was completed on 12/12; results were positive. Current symptoms include: cough  Symptoms are stable. Symptomatic treatment includes: Tylenol, Prior course of Tessalon without improvement, Took course of Prednisone last week, Albuterol   Presence of significant medical comorbidities including any history of MI, stroke, hypertension, chronic kidney disease, chronic liver disease, emphysema, asthma, sleep apnea, diabetes, history of cancer, dyslipidemia, obesity and smoking was evaluated. Comorbidities present include: Asthma, Obesity   Review of systems focused on signs and symptoms expected with Covid infection. Constitutional: Fever, fatigue, chills HEENT: Eye redness and discharge (conjunctivitis), nasal congestion, sore throat, anosmia, and altered taste Pulmonary: Cough nonproductive productive of sputum , dyspnea, tachycardia (pulse greater than 110), hemoptysis, chest pain, and tachypnea (respiratory rate greater than 22 GI: Anorexia, nausea, vomiting, diarrhea Genitourinary: Oliguria or anuria Skeletal: Myalgias Dermatologic: Rash Neurologic: Headache, dizziness, mental status changes Positive review of systems include: dyspnea, cough, nausea   Physical exam:  Vitals:   06/13/19 1740  BP: 120/72  Pulse: (!) 113  Temp: 98.6 F (37 C)  SpO2: 98%    Pertinent or positive findings: General appearance: Adequately nourished; no acute distress, increased work of breathing is present.   Lymphatic: No lymphadenopathy about the head, neck, axilla. Eyes: No conjunctival inflammation or lid edema is present. There is no scleral icterus. Ears:  External ear exam shows no significant lesions or deformities.   Nose:  External nasal examination shows no deformity or inflammation. Nasal mucosa are  pink and moist without lesions, exudates Oral exam:  Lips and gums are healthy appearing. There is no oropharyngeal erythema or exudate. Neck:  No thyromegaly, masses, tenderness noted.    Heart:  Normal rate and regular rhythm. S1 and S2 normal without gallop, murmur, click, rub .  Lungs: Chest clear to auscultation without wheezes, rhonchi, rales, rubs. Normal respiratory effort. No retractions. No tachypnea.  Abdomen: Bowel sounds are normal. Abdomen is soft and nontender with no organomegaly, hernias, masses. GU: Deferred  Extremities:  No cyanosis, clubbing, edema  Neuro: Normal tone. Normal gait.  Deep tendon reflexes are equal Skin: Warm & dry w/o tenting. No significant lesions or rash.  1. History of COVID-19 Patient with positive COVID test approximately 3 weeks ago. Has had persistent cough and dyspnea. Suspect lingering symptoms of infection as etiology of presentation. While patient mildly tachycardic, otherwise well appearing. Well's Score of 1.5 so low suspicion for VTE. Precautions for VTE discussed.   2. Shortness of breath Patient with stable pulse ox. She has ordered a pulse ox and is waiting for it to arrive. Lungs are clear and has non-concerning pulmonary exam. Will prescribe another cough medication. My chart monitoring sent up. Supportive care measures discussed. Precautions that would warrant ED follow up discussed.  [facility] COVID-19 HOME MONITORING PROGRAM - HYDROcodone-homatropine (HYCODAN) 5-1.5 MG/5ML syrup; Take 5 mLs by mouth every 8 (eight) hours as needed for cough.  Dispense: 120 mL; Refill: 0 - Temperature monitoring; Future  3. Nausea Rx for Zofran given.  - ondansetron (ZOFRAN) injection 4 mg - ondansetron (ZOFRAN ODT) 4 MG disintegrating tablet; Take 1 tablet (4 mg total) by mouth every 8 (eight) hours as needed for nausea or vomiting.  Dispense: 20 tablet; Refill: 0    Phill Myron, D.O. [facility]  06/13/2019, 6:01 PM

## 2019-06-14 ENCOUNTER — Encounter (INDEPENDENT_AMBULATORY_CARE_PROVIDER_SITE_OTHER): Payer: Self-pay

## 2019-06-16 ENCOUNTER — Encounter (INDEPENDENT_AMBULATORY_CARE_PROVIDER_SITE_OTHER): Payer: Self-pay

## 2019-06-16 ENCOUNTER — Encounter: Payer: Self-pay | Admitting: Family Medicine

## 2019-06-17 ENCOUNTER — Encounter: Payer: Self-pay | Admitting: Family Medicine

## 2019-06-17 ENCOUNTER — Telehealth: Payer: BC Managed Care – PPO | Admitting: Family Medicine

## 2019-06-17 ENCOUNTER — Encounter (INDEPENDENT_AMBULATORY_CARE_PROVIDER_SITE_OTHER): Payer: Self-pay

## 2019-06-17 NOTE — Telephone Encounter (Signed)
Sounds like her symptoms are worsening ---- if im wrong we can do virtual

## 2019-06-17 NOTE — Telephone Encounter (Signed)
I think patient may need to f/u resp clinic Not really sure about the part of the message about the nurse contacting her-- does she mean nurse practioner from resp clinic

## 2019-06-18 ENCOUNTER — Telehealth: Payer: Self-pay

## 2019-06-18 NOTE — Telephone Encounter (Signed)
Pt  scheduled for virtual tomorrow  

## 2019-06-18 NOTE — Telephone Encounter (Signed)
LM requesting call back to discuss symptoms/determine need for VV.

## 2019-06-19 ENCOUNTER — Ambulatory Visit (INDEPENDENT_AMBULATORY_CARE_PROVIDER_SITE_OTHER): Payer: BC Managed Care – PPO | Admitting: Family Medicine

## 2019-06-19 ENCOUNTER — Encounter: Payer: Self-pay | Admitting: Family Medicine

## 2019-06-19 ENCOUNTER — Encounter (INDEPENDENT_AMBULATORY_CARE_PROVIDER_SITE_OTHER): Payer: Self-pay

## 2019-06-19 ENCOUNTER — Other Ambulatory Visit: Payer: Self-pay

## 2019-06-19 DIAGNOSIS — R05 Cough: Secondary | ICD-10-CM

## 2019-06-19 DIAGNOSIS — R059 Cough, unspecified: Secondary | ICD-10-CM

## 2019-06-19 MED ORDER — AZITHROMYCIN 250 MG PO TABS: ORAL_TABLET | ORAL | 0 refills | Status: DC

## 2019-06-19 MED ORDER — PROMETHAZINE-DM 6.25-15 MG/5ML PO SYRP: 5.0000 mL | ORAL_SOLUTION | Freq: Four times a day (QID) | ORAL | 0 refills | Status: DC | PRN

## 2019-06-19 MED ORDER — BENZONATATE 100 MG PO CAPS: 100.0000 mg | ORAL_CAPSULE | Freq: Three times a day (TID) | ORAL | 0 refills | Status: DC | PRN

## 2019-06-19 NOTE — Progress Notes (Signed)
Virtual Visit via Video Note  I connected with Jabree A Cousin on 06/19/19 at 10:40 AM EST by a video enabled telemedicine application and verified that I am speaking with the correct person using two identifiers.  Location: Patient: home  Provider: office    I discussed the limitations of evaluation and management by telemedicine and the availability of in person appointments. The patient expressed understanding and agreed to proceed.  History of Present Illness: Pt is home and tested + for covid and was seen in resp clinic 1/8   Her symptoms started 12/12 with sob and cough and she was seen in ed 12/16 for worsening sob.  covid test done on 12/12 were positive  Pt is still coughing --- sob is not as bad    Observations/Objective: Vitals:   06/19/19 1029  Temp: 99.5 F (37.5 C)    Pt is in NAD   Assessment and Plan: 1. Cough Check cxr if no better   - benzonatate (TESSALON) 100 MG capsule; Take 1-2 capsules (100-200 mg total) by mouth 3 (three) times daily as needed for cough.  Dispense: 21 capsule; Refill: 0 - azithromycin (ZITHROMAX Z-PAK) 250 MG tablet; As directed  Dispense: 6 each; Refill: 0 - promethazine-dextromethorphan (PROMETHAZINE-DM) 6.25-15 MG/5ML syrup; Take 5 mLs by mouth 4 (four) times daily as needed.  Dispense: 118 mL; Refill: 0  Follow Up Instructions:    I discussed the assessment and treatment plan with the patient. The patient was provided an opportunity to ask questions and all were answered. The patient agreed with the plan and demonstrated an understanding of the instructions.   The patient was advised to call back or seek an in-person evaluation if the symptoms worsen or if the condition fails to improve as anticipated.     [name], DO

## 2019-06-19 NOTE — Patient Instructions (Signed)
COVID-19 Vaccine Information can be found at: [email or web address] For questions related to vaccine distribution or appointments, please email [email or web address] or call [phone].    

## 2019-06-20 ENCOUNTER — Ambulatory Visit
Admission: RE | Admit: 2019-06-20 | Discharge: 2019-06-20 | Disposition: A | Discharge status: Home / Self Care | Payer: BC Managed Care – PPO | Source: Ambulatory Visit | Attending: Obstetrics and Gynecology | Admitting: Obstetrics and Gynecology

## 2019-06-20 ENCOUNTER — Encounter: Payer: Self-pay | Admitting: Family Medicine

## 2019-06-20 ENCOUNTER — Other Ambulatory Visit: Payer: Self-pay

## 2019-06-20 DIAGNOSIS — Z1231 Encounter for screening mammogram for malignant neoplasm of breast: Secondary | ICD-10-CM

## 2019-06-20 NOTE — Telephone Encounter (Signed)
Pt called stating paperwork has to be returned today to [employer]. Pt said the 2nd portion of the leave (the extension due to continued symptoms ending on 07/21/2019) is needed to be sent in.   Pt requesting call back at [phone].

## 2019-06-21 ENCOUNTER — Encounter (INDEPENDENT_AMBULATORY_CARE_PROVIDER_SITE_OTHER): Payer: Self-pay

## 2019-06-22 ENCOUNTER — Encounter (INDEPENDENT_AMBULATORY_CARE_PROVIDER_SITE_OTHER): Payer: Self-pay

## 2019-06-23 ENCOUNTER — Encounter: Payer: Self-pay | Admitting: Family Medicine

## 2019-06-23 ENCOUNTER — Encounter (INDEPENDENT_AMBULATORY_CARE_PROVIDER_SITE_OTHER): Payer: Self-pay

## 2019-06-24 ENCOUNTER — Encounter (INDEPENDENT_AMBULATORY_CARE_PROVIDER_SITE_OTHER): Payer: Self-pay

## 2019-06-24 ENCOUNTER — Ambulatory Visit: Payer: BC Managed Care – PPO | Admitting: Psychology

## 2019-06-25 ENCOUNTER — Encounter (INDEPENDENT_AMBULATORY_CARE_PROVIDER_SITE_OTHER): Payer: Self-pay

## 2019-06-25 ENCOUNTER — Ambulatory Visit (INDEPENDENT_AMBULATORY_CARE_PROVIDER_SITE_OTHER): Payer: BC Managed Care – PPO | Admitting: Psychology

## 2019-06-25 DIAGNOSIS — F4322 Adjustment disorder with anxiety: Secondary | ICD-10-CM

## 2019-06-30 ENCOUNTER — Other Ambulatory Visit: Payer: Self-pay | Admitting: Allergy and Immunology

## 2019-06-30 ENCOUNTER — Encounter: Payer: Self-pay | Admitting: Family Medicine

## 2019-06-30 NOTE — Telephone Encounter (Signed)
She is resending to [name]?

## 2019-07-01 NOTE — Telephone Encounter (Signed)
Paperwork re-faxed this morning and received confirmation

## 2019-07-03 ENCOUNTER — Encounter: Payer: Self-pay | Admitting: Family Medicine

## 2019-07-08 ENCOUNTER — Other Ambulatory Visit: Payer: Self-pay | Admitting: Allergy and Immunology

## 2019-07-10 ENCOUNTER — Other Ambulatory Visit: Payer: Self-pay

## 2019-07-10 ENCOUNTER — Ambulatory Visit (INDEPENDENT_AMBULATORY_CARE_PROVIDER_SITE_OTHER): Payer: BC Managed Care – PPO | Admitting: Psychology

## 2019-07-10 ENCOUNTER — Ambulatory Visit (HOSPITAL_BASED_OUTPATIENT_CLINIC_OR_DEPARTMENT_OTHER)
Admission: RE | Admit: 2019-07-10 | Discharge: 2019-07-10 | Disposition: A | Discharge status: Home / Self Care | Payer: BC Managed Care – PPO | Source: Ambulatory Visit | Attending: Family Medicine | Admitting: Family Medicine

## 2019-07-10 DIAGNOSIS — M549 Dorsalgia, unspecified: Secondary | ICD-10-CM | POA: Insufficient documentation

## 2019-07-10 DIAGNOSIS — F4322 Adjustment disorder with anxiety: Secondary | ICD-10-CM

## 2019-07-10 DIAGNOSIS — R05 Cough: Secondary | ICD-10-CM | POA: Diagnosis not present

## 2019-07-10 DIAGNOSIS — U071 COVID-19: Secondary | ICD-10-CM | POA: Insufficient documentation

## 2019-07-14 ENCOUNTER — Encounter: Payer: Self-pay | Admitting: Family Medicine

## 2019-07-14 NOTE — Telephone Encounter (Signed)
Can we get her into resp clinic ?

## 2019-07-17 ENCOUNTER — Encounter: Payer: Self-pay | Admitting: Family Medicine

## 2019-07-18 ENCOUNTER — Ambulatory Visit: Payer: BC Managed Care – PPO

## 2019-07-18 ENCOUNTER — Telehealth: Payer: Self-pay

## 2019-07-18 NOTE — Telephone Encounter (Signed)
I spoke with pt and advised her that our clinic is for covid and suspected covid patients only. We can assess her respiratory symptoms only. Per Lavell Anchors, case not appropriate for the respiratory clinic. Pt understood and nothing further is needed.

## 2019-07-21 NOTE — Telephone Encounter (Signed)
I have not seen a new form Have we received it ---

## 2019-07-24 ENCOUNTER — Encounter: Payer: Self-pay | Admitting: Family Medicine

## 2019-07-24 NOTE — Telephone Encounter (Signed)
Patient needs a form completed that was faxed over last week  Please advise  I sent her a mychart letting her know I sent Nira Conn a message

## 2019-07-27 ENCOUNTER — Encounter: Payer: Self-pay | Admitting: Family Medicine

## 2019-07-28 ENCOUNTER — Encounter: Payer: Self-pay | Admitting: Family Medicine

## 2019-07-29 NOTE — Telephone Encounter (Signed)
See previous mychart message

## 2019-07-30 NOTE — Telephone Encounter (Signed)
She would be in the under 65 with comorbidity ---- so group 4---  They are starting group 3 today

## 2019-07-31 ENCOUNTER — Ambulatory Visit: Payer: BC Managed Care – PPO | Admitting: Psychology

## 2019-07-31 ENCOUNTER — Ambulatory Visit (INDEPENDENT_AMBULATORY_CARE_PROVIDER_SITE_OTHER): Payer: BC Managed Care – PPO | Admitting: Adult Health

## 2019-07-31 ENCOUNTER — Other Ambulatory Visit: Payer: Self-pay

## 2019-07-31 ENCOUNTER — Encounter: Payer: Self-pay | Admitting: Adult Health

## 2019-07-31 DIAGNOSIS — J4540 Moderate persistent asthma, uncomplicated: Secondary | ICD-10-CM | POA: Diagnosis not present

## 2019-07-31 DIAGNOSIS — J40 Bronchitis, not specified as acute or chronic: Secondary | ICD-10-CM | POA: Diagnosis not present

## 2019-07-31 MED ORDER — AZITHROMYCIN 250 MG PO TABS: ORAL_TABLET | ORAL | 0 refills | Status: AC

## 2019-07-31 MED ORDER — BENZONATATE 200 MG PO CAPS: 200.0000 mg | ORAL_CAPSULE | Freq: Three times a day (TID) | ORAL | 1 refills | Status: DC | PRN

## 2019-07-31 NOTE — Progress Notes (Signed)
Virtual Visit via Telephone Note  I connected with Morgan Long on 07/31/19 at  3:30 PM EST by telephone and verified that I am speaking with the correct person using two identifiers.  Location: Patient: Home  Provider: Office    I discussed the limitations, risks, security and privacy concerns of performing an evaluation and management service by telephone and the availability of in person appointments. I also discussed with the patient that there may be a patient responsible charge related to this service. The patient expressed understanding and agreed to proceed.   History of Present Illness: 49 year old female seen for pulmonary consult March 06, 2019 for asthma shortness of breath and abnormal chest x-ray (CT chest was normal )   Today's televisit is a an acute office visit for asthma.  Patient is followed for adult onset asthma maintained on BRETRI .  She complains over the last 3 days that she has had increased cough congestion with thick green mucus sinus drainage and increased cough. Patient says she developed COVID-19 infection in December complicated by Covid 19 pneumonia.  She says she has slowly been getting better but is taking her long time to recover.  She has had an ongoing cough that has not totally resolved but has gotten a lot better.  She says over the last 3 days is when her cough has really started back.  Prior CT chest November 2020 showed no acute process. During COVID-19 infection May 21, 2019 CT chest showed negative PE multiple nodular opacities surrounding groundglass in both lungs with dense peripheral airspace disease in the lung bases. Follow-up chest x-ray July 10, 2019 showed mild chronic interstitial prominence no acute process noted Patient denies any hemoptysis chest pain orthopnea PND or fever.  Observations/Objective:  Spirometry 06/2015-ratio 95, FEV1 108% FVC 95% Spirometry 04/2017-ratio 87,FEV1 84%, FVC 78%-mild restriction  Spirometry  01/2019-ratio 83, FEV1 93%, FVC 90%  CT chest April 18, 2019 showed no lymphadenopathy.  No nodularity.  No acute pulmonary process noted  Assessment and Plan: Acute bronchitis-treat with empiric antibiotics and cough control regimen.  COVID-19 infection with COVID-19 pneumonia December 2020.  Patient appears to be clinically improving.  CT chest did show a multifocal pneumonia consistent with COVID-19 pneumonia on CT chest December.  Follow-up chest x-ray showed no acute process. We will continue to follow.  Patient has persistent symptoms on out into 3 to 4 months post her Covid diagnosis will consider a repeat CT chest to evaluate lung parenchyma.  Plan  Patient Instructions  Zpack take as directed.  Mucinex Twice daily  As needed  Cough/congestion  Delsym 2 tsp Twice daily  As needed  Cough .  Tessalon Three times a day  As needed  Cough  BRETRI 2 puffs Twice daily  , rinse after use.  Follow up with Dr. Elsworth Soho  In 6 weeks and As needed   Please contact office for sooner follow up if symptoms do not improve or worsen or seek emergency care       Follow Up Instructions: Follow-up in 6 to 8 weeks and as needed  Please contact office for sooner follow up if symptoms do not improve or worsen or seek emergency care     I discussed the assessment and treatment plan with the patient. The patient was provided an opportunity to ask questions and all were answered. The patient agreed with the plan and demonstrated an understanding of the instructions.   The patient was advised to call back or seek  an in-person evaluation if the symptoms worsen or if the condition fails to improve as anticipated.  I provided 31minutes of non-face-to-face time during this encounter.   Rexene Edison, NP

## 2019-07-31 NOTE — Patient Instructions (Signed)
Zpack take as directed.  Mucinex Twice daily  As needed  Cough/congestion  Delsym 2 tsp Twice daily  As needed  Cough .  Tessalon Three times a day  As needed  Cough  BRETRI 2 puffs Twice daily  , rinse after use.  Follow up with Dr. Elsworth Soho  In 6 weeks and As needed   Please contact office for sooner follow up if symptoms do not improve or worsen or seek emergency care

## 2019-08-06 ENCOUNTER — Emergency Department (HOSPITAL_BASED_OUTPATIENT_CLINIC_OR_DEPARTMENT_OTHER): Payer: BC Managed Care – PPO

## 2019-08-06 ENCOUNTER — Other Ambulatory Visit: Payer: Self-pay

## 2019-08-06 ENCOUNTER — Encounter: Payer: Self-pay | Admitting: Family Medicine

## 2019-08-06 ENCOUNTER — Emergency Department (HOSPITAL_BASED_OUTPATIENT_CLINIC_OR_DEPARTMENT_OTHER)
Admission: EM | Admit: 2019-08-06 | Discharge: 2019-08-06 | Disposition: A | Discharge status: Home / Self Care | Payer: BC Managed Care – PPO | Attending: Emergency Medicine | Admitting: Emergency Medicine

## 2019-08-06 ENCOUNTER — Encounter (HOSPITAL_BASED_OUTPATIENT_CLINIC_OR_DEPARTMENT_OTHER): Payer: Self-pay | Admitting: Emergency Medicine

## 2019-08-06 DIAGNOSIS — R0789 Other chest pain: Secondary | ICD-10-CM | POA: Diagnosis not present

## 2019-08-06 DIAGNOSIS — J45909 Unspecified asthma, uncomplicated: Secondary | ICD-10-CM | POA: Diagnosis not present

## 2019-08-06 DIAGNOSIS — R002 Palpitations: Secondary | ICD-10-CM

## 2019-08-06 DIAGNOSIS — G43909 Migraine, unspecified, not intractable, without status migrainosus: Secondary | ICD-10-CM | POA: Insufficient documentation

## 2019-08-06 DIAGNOSIS — K7689 Other specified diseases of liver: Secondary | ICD-10-CM | POA: Insufficient documentation

## 2019-08-06 DIAGNOSIS — R519 Headache, unspecified: Secondary | ICD-10-CM | POA: Diagnosis not present

## 2019-08-06 DIAGNOSIS — I493 Ventricular premature depolarization: Secondary | ICD-10-CM | POA: Diagnosis not present

## 2019-08-06 DIAGNOSIS — G43001 Migraine without aura, not intractable, with status migrainosus: Secondary | ICD-10-CM | POA: Diagnosis not present

## 2019-08-06 DIAGNOSIS — G43901 Migraine, unspecified, not intractable, with status migrainosus: Secondary | ICD-10-CM

## 2019-08-06 DIAGNOSIS — R079 Chest pain, unspecified: Secondary | ICD-10-CM | POA: Diagnosis not present

## 2019-08-06 DIAGNOSIS — Z79899 Other long term (current) drug therapy: Secondary | ICD-10-CM | POA: Insufficient documentation

## 2019-08-06 DIAGNOSIS — R109 Unspecified abdominal pain: Secondary | ICD-10-CM | POA: Diagnosis not present

## 2019-08-06 LAB — COMPREHENSIVE METABOLIC PANEL
ALT: 19 U/L (ref 0–44)
AST: 18 U/L (ref 15–41)
Albumin: 3.6 g/dL (ref 3.5–5.0)
Alkaline Phosphatase: 57 U/L (ref 38–126)
Anion gap: 9 (ref 5–15)
BUN: 18 mg/dL (ref 6–20)
CO2: 22 mmol/L (ref 22–32)
Calcium: 8.9 mg/dL (ref 8.9–10.3)
Chloride: 105 mmol/L (ref 98–111)
Creatinine, Ser: 0.72 mg/dL (ref 0.44–1.00)
GFR calc Af Amer: >60 mL/min (ref >60)
GFR calc non Af Amer: >60 mL/min (ref >60)
Glucose, Bld: 87 mg/dL (ref 70–99)
Potassium: 4.1 mmol/L (ref 3.5–5.1)
Sodium: 136 mmol/L (ref 135–145)
Total Bilirubin: 0.2 mg/dL — ABNORMAL LOW (ref 0.3–1.2)
Total Protein: 7.6 g/dL (ref 6.5–8.1)

## 2019-08-06 LAB — CBC WITH DIFFERENTIAL/PLATELET
Abs Immature Granulocytes: 0.05 10*3/uL (ref 0.00–0.07)
Basophils Absolute: 0.1 10*3/uL (ref 0.0–0.1)
Basophils Relative: 1 %
Eosinophils Absolute: 0.1 10*3/uL (ref 0.0–0.5)
Eosinophils Relative: 1 %
HCT: 42.1 % (ref 36.0–46.0)
Hemoglobin: 13.7 g/dL (ref 12.0–15.0)
Immature Granulocytes: 0 %
Lymphocytes Relative: 23 %
Lymphs Abs: 2.6 10*3/uL (ref 0.7–4.0)
MCH: 29 pg (ref 26.0–34.0)
MCHC: 32.5 g/dL (ref 30.0–36.0)
MCV: 89.2 fL (ref 80.0–100.0)
Monocytes Absolute: 0.8 10*3/uL (ref 0.1–1.0)
Monocytes Relative: 7 %
Neutro Abs: 7.6 10*3/uL (ref 1.7–7.7)
Neutrophils Relative %: 68 %
Platelets: 322 10*3/uL (ref 150–400)
RBC: 4.72 MIL/uL (ref 3.87–5.11)
RDW: 14.6 % (ref 11.5–15.5)
WBC: 11.3 10*3/uL — ABNORMAL HIGH (ref 4.0–10.5)
nRBC: 0 % (ref 0.0–0.2)

## 2019-08-06 LAB — URINALYSIS, ROUTINE W REFLEX MICROSCOPIC
Bilirubin Urine: NEGATIVE
Glucose, UA: NEGATIVE mg/dL
Ketones, ur: NEGATIVE mg/dL
Leukocytes,Ua: NEGATIVE
Nitrite: NEGATIVE
Protein, ur: NEGATIVE mg/dL
Specific Gravity, Urine: 1.025 (ref 1.005–1.030)
pH: 5.5 (ref 5.0–8.0)

## 2019-08-06 LAB — URINALYSIS, MICROSCOPIC (REFLEX)

## 2019-08-06 LAB — D-DIMER, QUANTITATIVE: D-Dimer, Quant: 0.37 ug/mL-FEU (ref 0.00–0.50)

## 2019-08-06 LAB — PREGNANCY, URINE: Preg Test, Ur: NEGATIVE

## 2019-08-06 LAB — TROPONIN I (HIGH SENSITIVITY)
Troponin I (High Sensitivity): <2 ng/L (ref <18)
Troponin I (High Sensitivity): <2 ng/L (ref <18)

## 2019-08-06 MED ORDER — IOHEXOL 300 MG/ML  SOLN
100.0000 mL | Freq: Once | INTRAMUSCULAR | Status: AC | PRN
  Administered 2019-08-06: 100 mL via INTRAVENOUS

## 2019-08-06 MED ORDER — DIPHENHYDRAMINE HCL 50 MG/ML IJ SOLN
25.0000 mg | Freq: Once | INTRAMUSCULAR | Status: AC
  Administered 2019-08-06: 25 mg via INTRAVENOUS
  Filled 2019-08-06: qty 1

## 2019-08-06 MED ORDER — METOCLOPRAMIDE HCL 5 MG/ML IJ SOLN
10.0000 mg | Freq: Once | INTRAMUSCULAR | Status: AC
  Administered 2019-08-06: 10 mg via INTRAVENOUS
  Filled 2019-08-06: qty 2

## 2019-08-06 MED ORDER — ONDANSETRON 4 MG PO TBDP: 4.0000 mg | ORAL_TABLET | Freq: Three times a day (TID) | ORAL | 0 refills | Status: DC | PRN

## 2019-08-06 MED ORDER — ONDANSETRON HCL 4 MG/2ML IJ SOLN
4.0000 mg | Freq: Once | INTRAMUSCULAR | Status: AC
  Administered 2019-08-06: 4 mg via INTRAVENOUS
  Filled 2019-08-06: qty 2

## 2019-08-06 MED ORDER — FENTANYL CITRATE (PF) 100 MCG/2ML IJ SOLN
50.0000 ug | Freq: Once | INTRAMUSCULAR | Status: AC
  Administered 2019-08-06: 50 ug via INTRAVENOUS
  Filled 2019-08-06: qty 2

## 2019-08-06 MED ORDER — DEXAMETHASONE SODIUM PHOSPHATE 10 MG/ML IJ SOLN
10.0000 mg | Freq: Once | INTRAMUSCULAR | Status: AC
  Administered 2019-08-06: 10 mg via INTRAVENOUS
  Filled 2019-08-06: qty 1

## 2019-08-06 MED ORDER — SODIUM CHLORIDE 0.9 % IV BOLUS
1000.0000 mL | Freq: Once | INTRAVENOUS | Status: AC
  Administered 2019-08-06: 1000 mL via INTRAVENOUS

## 2019-08-06 MED ORDER — IOHEXOL 350 MG/ML SOLN: 100.0000 mL | Freq: Once | INTRAVENOUS | Status: DC | PRN

## 2019-08-06 NOTE — ED Triage Notes (Signed)
Chest "heaviness" for a week.  Palpitations since yesterday.  Migraines and nausea are normal for her but have been worse for a month.

## 2019-08-06 NOTE — ED Provider Notes (Signed)
[facility] EMERGENCY DEPARTMENT Provider Note   CSN: [id number] Arrival date & time: 08/06/19  1816     History Chief Complaint  Patient presents with  . Palpitations    Morgan Long is a 49 y.o. female with PMH/o who presents for evaluation of asthma, endometriosis, GERD, fatigue, who presents for evaluation of 2 weeks of chest pressure, SOB and palpitations that began yesterday. Patient states that for the last 2 weeks, she has had a chest pressure sensation.  She describes it as a "heaviness" across her anterior chest.  She states that is no alleviating or aggravating factors.  She states that it occurs both when she is at rest and when she is exerting herself.  She states it is not worse with exertion.  She states that she has not taken any occasion for the pain but states that this has been constant for the last 2 weeks.  Yesterday, she started having palpitations.  She states the palpitations have been consistent since yesterday.  She states she feels like she is having double beats.  She states that occasionally, she will get a sharp pain with the beats.  She also reports she has had shortness of breath over the last 2 weeks.  She feels like this is worse with exertion but she also has shortness of breath at rest.  She does feel like she has some chest pain with deep inspiration.  Patient states she has also had a migraine and nausea for 2 months.  She states she has had migraines previously that have lasted for a month.  She states that this does feel slightly different than her migraine. She says its the same quality but feels a little stronger than normally.  She has had nausea but denies any vomiting.  She denies any preceding trauma, injury to her head.  She has not noted any fevers, vomiting, numbness/weakness of her arms or legs. She denies any OCP use, recent immobilization, prior history of DVT/PE, recent surgery, leg swelling, or long travel.  She does not smoke.  She  denies any history of diabetes, hypertension.  No personal cardiac history.  No family history of MI before the age of 52.  The history is provided by the patient.    HPI: A 49 year old patient presents for evaluation of chest pain. Initial onset of pain was more than 6 hours ago. The patient's chest pain is described as heaviness/pressure/tightness and is not worse with exertion. The patient complains of nausea. The patient's chest pain is not middle- or left-sided, is not well-localized, is not sharp and does not radiate to the arms/jaw/neck. The patient denies diaphoresis. The patient has no history of stroke, has no history of peripheral artery disease, has not smoked in the past 90 days, denies any history of treated diabetes, has no relevant family history of coronary artery disease (first degree relative at less than age 27), is not hypertensive, has no history of hypercholesterolemia and does not have an elevated BMI (>=30).   Past Medical History:  Diagnosis Date  . Allergic rhinitis   . Asthma   . Chronic migraine    Dr. Catalina Gravel  . Constipation   . Dry eye syndrome of both lacrimal glands   . Dry skin   . Endometriosis   . Fatigue   . Gastroparesis   . GERD (gastroesophageal reflux disease)   . Headache(784.0)    occasional, dx w/ Migraines before, Topamax helps  . Heartburn   . History  of stomach ulcers   . Lactose intolerance   . Localized edema   . Migraine   . Muscle weakness (generalized)   . Nuclear cataract of both eyes    Mild  . Overweight   . Pain in right ankle and joints of right foot   . Shortness of breath   . Sinus complaint   . Sinusitis   . Tired   . TMJ pain dysfunction syndrome    occasional    Patient Active Problem List   Diagnosis Date Noted  . Anxiety 05/12/2019  . Abnormality of lung on CXR 03/06/2019  . Moderate persistent asthma 01/23/2019  . Lower extremity edema 01/23/2019  . Acute vaginitis 10/09/2018  . Acute pain of left shoulder  03/12/2018  . Other fatigue 08/14/2017  . Dyspnea 08/14/2017  . Other insomnia 08/14/2017  . Medication management 06/12/2017  . Nonintractable headache 06/12/2017  . Gastroparesis 06/12/2017  . Severe obesity (BMI >= 40) ([facility]) 04/25/2017  . Leukocytosis 03/01/2017  . Obesity (BMI 35.0-39.9 without comorbidity) 03/01/2017  . Chronic migraine without aura without status migrainosus, not intractable 03/01/2017  . Sinusitis 07/31/2016  . Influenza-like illness 07/12/2016  . Peroneal tendinitis of left lower extremity 11/03/2015  . Pes anserine bursitis 11/03/2015  . Asthma with acute exacerbation 06/15/2015  . Acute sinusitis 06/15/2015  . PCP NOTES >>>> 02/23/2015  . GERD (gastroesophageal reflux disease) 01/13/2015  . Myalgia and myositis 07/22/2014  . 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 Paresthesias 06/02/2014  . Acute pharyngitis 05/20/2014  . Strep pharyngitis 05/20/2014  . Degenerative cervical disc 10/28/2013  . Bursitis, shoulder 09/17/2013  . Muscle spasm of back 09/11/2013  . Nonallopathic lesion of thoracic region 09/11/2013  . General medical examination 03/28/2011  . Chronic rhinitis   . TMJ PAIN 12/16/2007  . Headache(784.0) 12/16/2007    Past Surgical History:  Procedure Laterality Date  . ANKLE SURGERY Left 11/16/2017  . AUGMENTATION MAMMAPLASTY Bilateral 2006  . BREAST ENHANCEMENT SURGERY  2006  . BUNIONECTOMY    . COLONOSCOPY    . endrometroisis    . fallopian tube removed     Left  . UPPER GASTROINTESTINAL ENDOSCOPY    . wisdoim teeth extraction       OB History    Gravida  0   Para  0   Term  0   Preterm  0   AB  0   Living  0     SAB  0   TAB  0   Ectopic  0   Multiple  0   Live Births  0           Family History  Problem Relation Age of Onset  . Throat cancer Mother   . Cancer - Other Mother 55       throat - died 50 months  . Cancer Mother   . Alcoholism Mother   . Heart disease Father   . Cancer Father   .  Breast cancer Maternal Aunt        breast  . Colon cancer Maternal Uncle 13       died 12  . Breast cancer Maternal Aunt        breast  . Pancreatic cancer Maternal Aunt   . Diabetes Other        grandmother  . Hypertension Maternal Aunt        several family members  . Throat cancer Maternal Uncle   . Asthma Other  cousin, maternal  . Breast cancer Maternal Aunt        total of 5 aunts  . Breast cancer Maternal Aunt   . Colon cancer Paternal Grandfather   . Heart attack Neg Hx   . Rectal cancer Neg Hx   . Stomach cancer Neg Hx     Social History   Tobacco Use  . Smoking status: Never Smoker  . Smokeless tobacco: Never Used  Substance Use Topics  . Alcohol use: Yes    Alcohol/week: 0.0 standard drinks    Comment: socially - occasional   . Drug use: No    Home Medications Prior to Admission medications   Medication Sig Start Date End Date Taking? Authorizing Provider  albuterol (PROAIR HFA) 108 (90 Base) MCG/ACT inhaler Inhale 2 puffs into the lungs every 6 (six) hours as needed for wheezing or shortness of breath. 01/23/19   Carollee Herter, Alferd Apa, DO  AMBULATORY NON FORMULARY MEDICATION Medication Name: Domperidone 10 mg three times daily before meals and at bedtime 09/12/18   Pyrtle, Lajuan Lines, MD  azelastine (ASTELIN) 0.1 % nasal spray PLACE 2 SPRAYS INTO BOTH NOSTRILS 2 (TWO) TIMES DAILY. 07/08/19   Bobbitt, Sedalia Muta, MD  BD PEN NEEDLE NANO U/F 32G X 4 MM MISC 1 PACKAGE BY DOES NOT APPLY ROUTE 2 (TWO) TIMES DAILY. 12/05/18   Dennard Nip D, MD  benzonatate (TESSALON) 100 MG capsule Take 1-2 capsules (100-200 mg total) by mouth 3 (three) times daily as needed for cough. Patient not taking: Reported on 07/31/2019 06/19/19   Carollee Herter, Alferd Apa, DO  benzonatate (TESSALON) 200 MG capsule Take 1 capsule (200 mg total) by mouth 3 (three) times daily as needed for cough. 07/31/19 07/30/20  Parrett, Tammy S, NP  bimatoprost (LATISSE) 0.03 % ophthalmic solution APPLY ALONG UPPER  EYELID MARGIN AT BASE OF EYELASHES ONCE A DAY 12/03/18   [provider]  Budeson-Glycopyrrol-Formoterol (BREZTRI AEROSPHERE) 160-9-4.8 MCG/ACT AERO Inhale 2 puffs into the lungs 2 (two) times daily. 03/10/19   Bobbitt, Sedalia Muta, MD  cetirizine (ZYRTEC) 10 MG tablet TAKE 1 TABLET BY MOUTH DAILY 05/10/18   Carollee Herter, Yvonne R, DO  ESTROGEL 0.75 MG/1.25 GM (0.06%) topical gel Apply 1 application topically See admin instructions. 05/18/15   [provider]  etonogestrel-ethinyl estradiol (NUVARING) 0.12-0.015 MG/24HR vaginal ring Place 1 each vaginally every 28 (twenty-eight) days. Insert vaginally and leave in place for 3 consecutive weeks, then remove for 1 week.     [provider]  flunisolide (NASALIDE) 25 MCG/ACT (0.025%) SOLN Place 1-2 sprays into the nose 2 (two) times daily as needed. 02/07/19   Bobbitt, Sedalia Muta, MD  hydrochlorothiazide (HYDRODIURIL) 25 MG tablet Take 1 tablet (25 mg total) by mouth daily. 01/23/19   [name], DO  ibuprofen (ADVIL,MOTRIN) 800 MG tablet Take 800 mg by mouth every 6 (six) hours as needed. for pain 11/27/16   [provider]  levocetirizine (XYZAL) 5 MG tablet TAKE 1 TABLET (5 MG TOTAL) BY MOUTH DAILY AS NEEDED FOR ALLERGIES. 06/30/19   Bobbitt, Sedalia Muta, MD  linaclotide [facility]) 72 MCG capsule Take 1 capsule (72 mcg total) by mouth daily before breakfast. 04/11/19   Pyrtle, Lajuan Lines, MD  meloxicam (MOBIC) 15 MG tablet Take by mouth. 01/02/19   [provider]  Multiple Vitamin (MULTIVITAMIN) LIQD Take 5 mLs by mouth daily.    [provider]  ondansetron (ZOFRAN ODT) 4 MG disintegrating tablet Take 1 tablet (4 mg  total) by mouth every 8 (eight) hours as needed for nausea or vomiting. 08/06/19   Volanda Napoleon, PA-C  PAZEO 0.7 % SOLN  01/23/19   [provider]  phentermine 15 MG capsule Take 1 capsule (15 mg total) by mouth every morning. 01/14/19   Dennard Nip D, MD  PREVIDENT 5000  SENSITIVE 1.1-5 % PSTE USE BEFORE BED DO NOT RINSE 11/21/18   [provider]  promethazine (PHENERGAN) 12.5 MG tablet Take 1 tablet (12.5 mg total) by mouth every 6 (six) hours as needed for nausea or vomiting. 05/12/19   [name], DO  promethazine-dextromethorphan (PROMETHAZINE-DM) 6.25-15 MG/5ML syrup Take 5 mLs by mouth 4 (four) times daily as needed. 06/19/19   Roma Schanz R, DO  RESTASIS MULTIDOSE 0.05 % ophthalmic emulsion Place 1 drop into both eyes as needed. 11/25/16   [provider]  rizatriptan (MAXALT-MLT) 10 MG disintegrating tablet TAKE 1 TABLET (10 MG TOTAL) BY MOUTH AS NEEDED FOR MIGRAINE. MAY REPEAT IN 2 HOURS IF NEEDED 05/06/18   Star Age, MD  SAXENDA 18 MG/3ML SOPN INJECT 3 MG INTO THE SKIN DAILY. (NOT COVERED) CMD Patient not taking: Reported on 07/31/2019 12/04/18   Dennard Nip D, MD  spironolactone (ALDACTONE) 25 MG tablet Take 25 mg by mouth 2 (two) times daily. 02/11/19   [provider]  Ubrogepant ([name]) 50 MG TABS Take 50 mg by mouth 2 (two) times daily as needed (1 pill at onset of migraine, may repeat in 2 hours. no more than 4 pills a day). 05/08/19   Star Age, MD  Vitamin D, Ergocalciferol, (DRISDOL) 1.25 MG (50000 UT) CAPS capsule Take 1 capsule (50,000 Units total) by mouth every 7 (seven) days. 01/14/19   Dennard Nip D, MD    Allergies    Acetaminophen, Hydrocodone, Oxycodone, and Codeine  Review of Systems   Review of Systems  Constitutional: Negative for fever.  Eyes: Negative for visual disturbance.  Respiratory: Positive for shortness of breath. Negative for cough.   Cardiovascular: Positive for chest pain.  Gastrointestinal: Positive for nausea. Negative for abdominal pain and vomiting.  Genitourinary: Negative for dysuria and hematuria.  Neurological: Positive for headaches. Negative for weakness and numbness.  All other systems reviewed and are negative.   Physical Exam Updated Vital Signs BP  95/61   Pulse 78   Temp 98.7 F (37.1 C) (Oral)   Resp 20   Ht 5\' 3"  (1.6 m)   Wt 102.1 kg   LMP 08/01/2019   SpO2 96%   BMI 39.86 kg/m   Physical Exam Vitals and nursing note reviewed.  Constitutional:      Appearance: Normal appearance. She is well-developed.  HENT:     Head: Normocephalic and atraumatic.  Eyes:     General: Lids are normal.     Conjunctiva/sclera: Conjunctivae normal.     Pupils: Pupils are equal, round, and reactive to light.  Neck:     Vascular: No carotid bruit.  Cardiovascular:     Rate and Rhythm: Normal rate and regular rhythm.     Pulses: Normal pulses.          Radial pulses are 2+ on the right side and 2+ on the left side.       Dorsalis pedis pulses are 2+ on the right side and 2+ on the left side.     Heart sounds: Normal heart sounds. No murmur. No friction rub. No gallop.   Pulmonary:  Effort: Pulmonary effort is normal.     Breath sounds: Normal breath sounds.     Comments: Lungs clear to auscultation bilaterally.  Symmetric chest rise.  No wheezing, rales, rhonchi. Chest:       Comments: Mild tenderness palpation of the anterior chest.  No deformity or crepitus noted. Abdominal:     Palpations: Abdomen is soft. Abdomen is not rigid.     Tenderness: There is no abdominal tenderness. There is no guarding.     Comments: Abdomen is soft, non-distended, non-tender. No rigidity, No guarding. No peritoneal signs.  Musculoskeletal:        General: Normal range of motion.     Cervical back: Full passive range of motion without pain.     Comments: Bilateral lower extremities are symmetric in appearance without any overlying warmth, erythema, edema.  Skin:    General: Skin is warm and dry.     Capillary Refill: Capillary refill takes less than 2 seconds.  Neurological:     Mental Status: She is alert and oriented to person, place, and time.     Comments: Cranial nerves III-XII intact Follows commands, Moves all extremities  5/5 strength  to BUE and BLE  Sensation intact throughout all major nerve distributions No slurred speech. No facial droop.   Psychiatric:        Speech: Speech normal.     ED Results / Procedures / Treatments   Labs (all labs ordered are listed, but only abnormal results are displayed) Labs Reviewed  COMPREHENSIVE METABOLIC PANEL - Abnormal; Notable for the following components:      Result Value   Total Bilirubin 0.2 (*)    All other components within normal limits  CBC WITH DIFFERENTIAL/PLATELET - Abnormal; Notable for the following components:   WBC 11.3 (*)    All other components within normal limits  URINALYSIS, ROUTINE W REFLEX MICROSCOPIC - Abnormal; Notable for the following components:   Hgb urine dipstick SMALL (*)    All other components within normal limits  URINALYSIS, MICROSCOPIC (REFLEX) - Abnormal; Notable for the following components:   Bacteria, UA RARE (*)    All other components within normal limits  D-DIMER, QUANTITATIVE (NOT AT [facility])  PREGNANCY, URINE  TROPONIN I (HIGH SENSITIVITY)  TROPONIN I (HIGH SENSITIVITY)    EKG EKG Interpretation  Date/Time:  Wednesday August 06 2019 18:29:27 EST Ventricular Rate:  90 PR Interval:    QRS Duration: 112 QT Interval:  373 QTC Calculation: 457 R Axis:   54 Text Interpretation: Sinus rhythm Ventricular bigeminy Borderline intraventricular conduction delay ST elev, probable normal early repol pattern No significant change since last tracing Confirmed by Deno Etienne [phone]) on 08/06/2019 7:25:35 PM   Radiology DG Chest 2 View  Result Date: 08/06/2019 CLINICAL DATA:  Chest pain for 1 week EXAM: CHEST - 2 VIEW COMPARISON:  07/10/19 FINDINGS: Cardiac shadow is stable. The lungs are well aerated bilaterally. No sizable effusion is noted. No bony abnormality is seen. Bilateral breast implants are noted increasing density over the bases bilaterally. IMPRESSION: No active cardiopulmonary disease. Electronically Signed   By: Inez Catalina  M.D.   On: 08/06/2019 19:37   CT Head Wo Contrast  Result Date: 08/06/2019 CLINICAL DATA:  Headache EXAM: CT HEAD WITHOUT CONTRAST TECHNIQUE: Contiguous axial images were obtained from the base of the skull through the vertex without intravenous contrast. COMPARISON:  December 02, 2010 FINDINGS: Brain: No evidence of acute territorial infarction, hemorrhage, hydrocephalus,extra-axial collection or mass lesion/mass effect. Normal  gray-white differentiation. Ventricles are normal in size and contour. Vascular: No hyperdense vessel or unexpected calcification. Skull: The skull is intact. No fracture or focal lesion identified. Sinuses/Orbits: The visualized paranasal sinuses and mastoid air cells are clear. The orbits and globes intact. Other: None IMPRESSION: No acute intracranial abnormality. Electronically Signed   By: Prudencio Pair M.D.   On: 08/06/2019 22:39   CT ABDOMEN PELVIS W CONTRAST  Result Date: 08/06/2019 CLINICAL DATA:  Abdominal pain. EXAM: CT ABDOMEN AND PELVIS WITH CONTRAST TECHNIQUE: Multidetector CT imaging of the abdomen and pelvis was performed using the standard protocol following bolus administration of intravenous contrast. CONTRAST:  148mL OMNIPAQUE IOHEXOL 300 MG/ML  SOLN COMPARISON:  None FINDINGS: Lower chest: The lung bases are clear. The heart size is normal. Hepatobiliary: There is an indeterminate hypoattenuating area adjacent to the gallbladder fossa measuring approximately 1.4 cm (coronal series 5, image 29). There is an additional hypoattenuating 0.9 cm nodule in hepatic segment 2 (coronal series 5, image 32)the gallbladder is unremarkable. There is no intrahepatic biliary ductal dilatation. The liver is heterogeneous in appearance which may be secondary to underlying hepatic steatosis. Pancreas: Normal contours without ductal dilatation. No peripancreatic fluid collection. Spleen: No splenic laceration or hematoma. Adrenals/Urinary Tract: --Adrenal glands: No adrenal hemorrhage.  --Right kidney/ureter: No hydronephrosis or perinephric hematoma. --Left kidney/ureter: No hydronephrosis or perinephric hematoma. --Urinary bladder: Unremarkable. Stomach/Bowel: --Stomach/Duodenum: No hiatal hernia or other gastric abnormality. Normal duodenal course and caliber. --Small bowel: No dilatation or inflammation. --Colon: No focal abnormality. --Appendix: Normal. Vascular/Lymphatic: Normal course and caliber of the major abdominal vessels. --No retroperitoneal lymphadenopathy. --No mesenteric lymphadenopathy. --No pelvic or inguinal lymphadenopathy. Reproductive: Unremarkable Other: No ascites or free air. The abdominal wall is normal. Musculoskeletal. No acute displaced fractures. IMPRESSION: 1. No acute abnormality detected. 2. Indeterminate hypoattenuating masses in the left and right hepatic lobes as detailed above. Follow-up with a nonemergent outpatient contrast enhanced liver mass protocol MRI is recommended for further evaluation. 3. Heterogeneous appearance of the liver which may be secondary to underlying hepatic steatosis. Electronically Signed   By: Constance Holster M.D.   On: 08/06/2019 21:36    Procedures Procedures (including critical care time)  Medications Ordered in ED Medications  metoCLOPramide (REGLAN) injection 10 mg (10 mg Intravenous Given 08/06/19 1932)  diphenhydrAMINE (BENADRYL) injection 25 mg (25 mg Intravenous Given 08/06/19 1931)  sodium chloride 0.9 % bolus 1,000 mL (0 mLs Intravenous Stopped 08/06/19 2054)  fentaNYL (SUBLIMAZE) injection 50 mcg (50 mcg Intravenous Given 08/06/19 2020)  iohexol (OMNIPAQUE) 300 MG/ML solution 100 mL (100 mLs Intravenous Contrast Given 08/06/19 2115)  ondansetron (ZOFRAN) injection 4 mg (4 mg Intravenous Given 08/06/19 2245)  dexamethasone (DECADRON) injection 10 mg (10 mg Intravenous Given 08/06/19 2245)    ED Course  I have reviewed the triage vital signs and the nursing notes.  Pertinent labs & imaging results that were available  during my care of the patient were reviewed by me and considered in my medical decision making (see chart for details).    MDM Rules/Calculators/A&P HEAR Score: 2                    49 year old female who presents for evaluation of multiple complaints.  She reports that she has had a migraine with nausea for the last 2 months.  History of migraines but states that this feels slightly worse than what she normally has.  No preceding trauma, injury.  No fevers.  No numbness/weakness.  No neuro deficits  noted on exam.  Consider migraine.  History/physical exam not concerning for meningitis.  She also reports chest pain that has been ongoing for last 2 weeks.  Describes it as a heaviness.  It is not specifically worsened by any exertion states she has it both at rest and while doing activity.  She states that sometimes it will occur randomly and she will be able to do activity without having specific pain.  Also reports some associated shortness of breath.  She has had nausea related to her migraine but denies any diaphoresis.  Additionally, she is reporting some palpitations that began yesterday.  No PE risk factors.  On initially arrival, she is afebrile, nontoxic-appearing.  Her story sounds atypical for ACS etiology definitely has atypical features.  She does have some pleuritic component of her symptoms as well as some shortness of breath.  She is low risk for PE so we will check D-dimer.  Also consider infectious etiology versus electrolyte abnormality.  Initial troponin negative.  D-dimer is negative.  CMP shows no acute abnormalities.  CBC shows slight leukocytosis.  Otherwise unremarkable.  UA shows no evidence of infectious etiology.  Urine pregnancy negative.  Chest x-ray shows no acute abnormalities.  RN inform me that patient was complaining of some lower abdominal hand back pain.  I went and reevaluated the patient and she did say that she was having some lower abdominal pain, particularly on the  left.  We will plan to give her analgesics.  I discussed results with patient.  Patient reports that she still has some chest pain but states it has slightly improved.  Headache has slightly improved after migraine cocktail but states she still has some headache.  She did feel initially after she got the migraine cocktail, it made the headache a little bit worse than normal which she states is not atypical for her.  I discussed at length regarding with patient with her results.  She still has some abdominal tenderness.  I engaged in shared decision making regarding further work-up for her abdominal pain.  Patient is concerned about possible ovarian cyst versus infectious etiology.  We will plan to get a CT abdomen pelvis.  Given patient's history/risk factors, she has a heart score of 2.  We will plan to get a delta troponin.  CT on pelvis shows no acute abnormalities.  There is mention of hypoattenuating masses in the left and right liver.  Recommend nonemergent outpatient MRI for further evaluation.  Discussed results with patient.  Her abdominal pain is improved.  Chest pain is improved though still slightly there.  She states that the headache is still there has not had much improvement.  I again discussed with her at length regarding her headache.  Patient states he feels slightly stronger than her typical migraines.  She has no neuro deficits on exam.  She does state that she has had a headache and nausea for about 2 months.  She follows with [facility] and gets Botox injection.  No other medications that she takes.  Given that her symptoms have been ongoing for months and that she feels like this is slightly worse than her normal migraine, I feel that it is prudent to get a CT head to ensure that there is no intracranial mass that would be contributing to her symptoms.  Patient is agreement.  CT head negative for any acute abnormalities.  Patient still troponin negative.  At this time, given  2 troponins that are negative, atypical features  of her chest pain as well as her low heart score, do not believe that she requires further admission for cardiac rule out.  I did discuss with patient that her PVCs will need follow-up with outpatient cardiology.  Additionally, instructed patient to follow-up with her neurologist for her headache.  I discussed with patient regarding findings of CT on pelvis.  At this time, no emergent indication for treatment.  Instructed patient to have her primary care doctor follow-up. At this time, patient exhibits no emergent life-threatening condition that require further evaluation in ED or admission. Patient had ample opportunity for questions and discussion. All patient's questions were answered with full understanding. Strict return precautions discussed. Patient expresses understanding and agreement to plan.   Portions of this note were generated with Lobbyist. Dictation errors may occur despite best attempts at proofreading.   Final Clinical Impression(s) / ED Diagnoses Final diagnoses:  Palpitations  PVC's (premature ventricular contractions)  Liver nodule  Atypical chest pain  Migraine with status migrainosus, not intractable, unspecified migraine type    Rx / DC Orders ED Discharge Orders         Ordered    ondansetron (ZOFRAN ODT) 4 MG disintegrating tablet  Every 8 hours PRN     08/06/19 2250           Volanda Napoleon, PA-C 08/06/19 [place], [place], DO 08/06/19 2309

## 2019-08-06 NOTE — Telephone Encounter (Signed)
Called patient. Advised patient she needed to be seen in the ED or urgent care. Pt states she was headed to the ED downstairs. FYI

## 2019-08-06 NOTE — Discharge Instructions (Signed)
As we discussed, your work-up today was reassuring.  You do have some evidence of premature ventricular contractions on her EKG which is likely the source of your symptoms.  At this time, your EKG and blood work was otherwise reassuring.  This needs to be followed up with cardiology.  I provided referral that you discussed with them.  Additionally, your head CT was reassuring.  This is most likely your migraine.  You need to follow-up with your neurologist regarding your migraines.  As we discussed, your CT scan did show some small abnormalities on your liver.  There was 2 areas that are small hypoattenuating (different shade than the other parts of the liver) that they could not specifically characterize.  These not likely related to your symptoms.  Discussed this with your primary care doctor.  Usually this requires monitoring but they will determine any next steps that are needed.  Return the emergency department for any fevers, worsening chest pain, difficulty breathing, numbness/weakness in your arms or legs, nausea/vomiting or any other worsening or concerning symptoms.

## 2019-08-07 ENCOUNTER — Other Ambulatory Visit: Payer: Self-pay | Admitting: Family Medicine

## 2019-08-07 ENCOUNTER — Encounter: Payer: Self-pay | Admitting: Family Medicine

## 2019-08-07 DIAGNOSIS — R002 Palpitations: Secondary | ICD-10-CM

## 2019-08-07 DIAGNOSIS — R9431 Abnormal electrocardiogram [ECG] [EKG]: Secondary | ICD-10-CM

## 2019-08-07 NOTE — Telephone Encounter (Signed)
Referral was put in  

## 2019-08-10 ENCOUNTER — Encounter: Payer: Self-pay | Admitting: Family Medicine

## 2019-08-10 NOTE — Progress Notes (Signed)
Cardiology Office Note:    Date:  08/14/2019   ID:  Morgan Long, DOB Oct 15, 1970, MRN [id number]  PCP:  Carollee Herter, Alferd Apa, DO  Cardiologist:  [name] More, MD   Referring MD: Carollee Herter, Alferd Apa, *  ASSESSMENT:    1. Chest pain of uncertain etiology   2. PVC's (premature ventricular contractions)   3. Asthma, unspecified asthma severity, unspecified whether complicated, unspecified whether persistent   4. BMI 40.0-44.9, adult (HCC)   5. Class 2 severe obesity with serious comorbidity and body mass index (BMI) of 39.0 to 39.9 in adult, unspecified obesity type ([place])   6. Palpitations    PLAN:    In order of problems listed above:  1. Since COVID-19 infection she has had persistent chest and back pain which appears to be musculoskeletal costochondral.  In the emergency room no indication of acute coronary syndrome.  At this time I do not think she needs an ischemia evaluation or to repeat her troponin. 2. Her PVCs are concerning and with association of her peripheral edema raises concern of cardiomyopathy and will do an echocardiogram of the first possible occasion perhaps even this afternoon check a proBNP level.  3-day ZIO monitor to quantitate frequency and complexity of ventricular arrhythmia and check magnesium with diuretic therapy as a precipitant.  Her potassium was normal in the emergency room.  If she has complex ventricular arrhythmia will require cardiac MR. 3. The real clinical question is whether his symptoms shortness of breath is due to heart disease or permanent injury from COVID-19 she has follow-up with her pulmonary physicians having PFTs done and would likely benefit from a repeat high-resolution CT scan of chest.  D-dimer was low and I do not think she requires a repeat CTA 4. 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  Next appointment in 3  weeks   Medication Adjustments/Labs and Tests Ordered: Current medicines are reviewed at length with the patient today.  Concerns regarding medicines are outlined above.  Orders Placed This Encounter  Procedures  . Pro b natriuretic peptide (BNP)  . Magnesium  . LONG TERM MONITOR (3-14 DAYS)  . ECHOCARDIOGRAM COMPLETE   No orders of the defined types were placed in this encounter.    Chief Complaint  Patient presents with  . Palpitations    History of Present Illness:    Morgan Long is a 49 y.o. female with asthma and COVID 19 pneumonia in December 2020 who is being seen today for the evaluation of palpitation at the request of [name], *  She was seen in the emergency department 08/06/2019 with complaints of chest pressure shortness of breath and palpitation.  Noted to have chest wall tenderness on the physical examination along the costal chondral border.  EKG showed sinus rhythm with ventricular bigeminy her high-sensitivity troponin was low D-dimer was normal and CBC showed a mild leukocytosis with a normal chest x-ray. Her heart score was low at 2-second troponin was checked without significant change and she was felt to be low risk and discharged from the hospital.  Other problems include a migraine headache referred to a neurologist and was evaluated in the ED for abdominal pain with both CT of the abdomen and pelvis and CT of the head.  Final diagnoses included palpitation PVCs and atypical chest pain.   Ref Range & Units 7 d ago 7 d ago  2 mo ago  Troponin I (High Sensitivity) <18 ng/L <2  <2 CM  <2 CM     Ref Range & Units 8 d ago 2 mo ago 4 yr ago  D-Dimer, Quant 0.00 - 0.50 ug/mL-FEU 0.37  0.34 CM  <0.27 R, CM     I independently reviewed the EKG from 08/07/2019 showed sinus rhythm normal QRS morphology and frequent PVCs  She had a chest CTA performed 05/21/2019 which showed normal cardiac size no pericardial effusion and no coronary artery or  aortic calcification.  She had diffuse airway thickening and multiple areas of nodular airway opacification with surrounding groundglass throughout both lungs.  Differential diagnosis was multifocal pneumonia versus COVID-19 lung infection.  At the time of that ED visit her COVID-19 test was negative however she was noted at that ED visit to have a history of recently diagnosed COVID-19 pneumonia with worsening symptoms shortness of breath chest pain with inspiration and fever.  Her cardiovascular risk is low without diabetes hypertension hyperlipidemia or cigarette smoking.  Is followed by pulmonary for adult onset asthma.  She has no history of pneumonia but takes both a proximal thiazide diuretic as well as MRA..  She also is using phentermine and a bronchodilator Ventolin.  I reviewed her medications she has not taken phentermine since she had COVID-19 rarely uses a bronchodilator is not taking spironolactone and she was put on in the past for hair loss.  She has been taking a thiazide diuretic.  She has not been well since the diagnosis of COVID-19.  She has had shortness of breath and a persistent aching in her back and her chest coughing hurts when she coughs moves and tenderness of her chest wall.  The symptoms have not improved and she is short of breath with activities inside and outside and at times even with ADLs.  She has increasing edema she is sleeping on multiple pillows but no paroxysmal nocturnal dyspnea.  Chest pain is not anginal and she has chest wall tenderness.  In the last 2 weeks she is much more aware of her heart racing the feels rapid and at times she feels lightheaded especially when she changes posture.  She also notices PVCs with a forceful contraction particularly at nighttime.  He had asthma preceding a respiratory illness.  Her chest x-ray in the emergency room was normal.  She had a number heart murmur as a child but no congenital rheumatic heart disease.  Past Medical  History:  Diagnosis Date  . Allergic rhinitis   . Asthma   . Chronic migraine    Dr. Catalina Gravel  . Constipation   . Dry eye syndrome of both lacrimal glands   . Dry skin   . Endometriosis   . Fatigue   . Gastroparesis   . GERD (gastroesophageal reflux disease)   . Headache(784.0)    occasional, dx w/ Migraines before, Topamax helps  . Heartburn   . History of stomach ulcers   . Lactose intolerance   . Localized edema   . Migraine   . Muscle weakness (generalized)   . Nuclear cataract of both eyes    Mild  . Overweight   . Pain in right ankle and joints of right foot   . Shortness of breath   . Sinus complaint   . Sinusitis   . Tired   . TMJ pain dysfunction syndrome    occasional    Past Surgical History:  Procedure Laterality Date  . ANKLE SURGERY Left  11/16/2017  . AUGMENTATION MAMMAPLASTY Bilateral 2006  . BREAST ENHANCEMENT SURGERY  2006  . BUNIONECTOMY    . COLONOSCOPY    . endrometroisis    . fallopian tube removed     Left  . UPPER GASTROINTESTINAL ENDOSCOPY    . wisdoim teeth extraction      Current Medications: Current Meds  Medication Sig  . albuterol (PROAIR HFA) 108 (90 Base) MCG/ACT inhaler Inhale 2 puffs into the lungs every 6 (six) hours as needed for wheezing or shortness of breath.  Marland Kitchen azelastine (ASTELIN) 0.1 % nasal spray PLACE 2 SPRAYS INTO BOTH NOSTRILS 2 (TWO) TIMES DAILY.  . BD PEN NEEDLE NANO U/F 32G X 4 MM MISC 1 PACKAGE BY DOES NOT APPLY ROUTE 2 (TWO) TIMES DAILY.  . bimatoprost (LATISSE) 0.03 % ophthalmic solution APPLY ALONG UPPER EYELID MARGIN AT BASE OF EYELASHES ONCE A DAY  . cetirizine (ZYRTEC) 10 MG tablet TAKE 1 TABLET BY MOUTH DAILY  . ESTROGEL 0.75 MG/1.25 GM (0.06%) topical gel Apply 1 application topically See admin instructions.  Marland Kitchen etonogestrel-ethinyl estradiol (NUVARING) 0.12-0.015 MG/24HR vaginal ring Place 1 each vaginally every 28 (twenty-eight) days. Insert vaginally and leave in place for 3 consecutive weeks, then remove  for 1 week.   . hydrochlorothiazide (HYDRODIURIL) 25 MG tablet Take 1 tablet (25 mg total) by mouth daily.  Marland Kitchen ibuprofen (ADVIL,MOTRIN) 800 MG tablet Take 800 mg by mouth every 6 (six) hours as needed. for pain  . levocetirizine (XYZAL) 5 MG tablet TAKE 1 TABLET (5 MG TOTAL) BY MOUTH DAILY AS NEEDED FOR ALLERGIES.  Marland Kitchen linaclotide (LINZESS) 72 MCG capsule Take 1 capsule (72 mcg total) by mouth daily before breakfast.  . meloxicam (MOBIC) 15 MG tablet Take by mouth.  . Multiple Vitamin (MULTIVITAMIN) LIQD Take 5 mLs by mouth daily.  . ondansetron (ZOFRAN ODT) 4 MG disintegrating tablet Take 1 tablet (4 mg total) by mouth every 8 (eight) hours as needed for nausea or vomiting.  Marland Kitchen PAZEO 0.7 % SOLN   . phentermine 15 MG capsule Take 1 capsule (15 mg total) by mouth every morning.  Marland Kitchen PREVIDENT 5000 SENSITIVE 1.1-5 % PSTE USE BEFORE BED DO NOT RINSE  . promethazine (PHENERGAN) 12.5 MG tablet Take 1 tablet (12.5 mg total) by mouth every 6 (six) hours as needed for nausea or vomiting.  . RESTASIS MULTIDOSE 0.05 % ophthalmic emulsion Place 1 drop into both eyes as needed.  . rizatriptan (MAXALT-MLT) 10 MG disintegrating tablet TAKE 1 TABLET (10 MG TOTAL) BY MOUTH AS NEEDED FOR MIGRAINE. MAY REPEAT IN 2 HOURS IF NEEDED  . SAXENDA 18 MG/3ML SOPN INJECT 3 MG INTO THE SKIN DAILY. (NOT COVERED) CMD  . spironolactone (ALDACTONE) 25 MG tablet Take 25 mg by mouth 2 (two) times daily.  Marland Kitchen Ubrogepant ([name]) 50 MG TABS Take 50 mg by mouth 2 (two) times daily as needed (1 pill at onset of migraine, may repeat in 2 hours. no more than 4 pills a day).  . Vitamin D, Ergocalciferol, (DRISDOL) 1.25 MG (50000 UT) CAPS capsule Take 1 capsule (50,000 Units total) by mouth every 7 (seven) days.     Allergies:   Hydrocodone, Oxycodone, and Codeine   Social History   Socioeconomic History  . Marital status: Single    Spouse name: Not on file  . Number of children: 0  . Years of education: BS  . Highest education level:  Not on file  Occupational History  . Occupation: Paramedic, going to school  Employer: [employer]  . Occupation: Air traffic controller  Tobacco Use  . Smoking status: Never Smoker  . Smokeless tobacco: Never Used  Substance and Sexual Activity  . Alcohol use: Yes    Alcohol/week: 0.0 standard drinks    Comment: socially - occasional   . Drug use: No  . Sexual activity: Yes    Birth control/protection: Inserts    Comment: nuvaring  Other Topics Concern  . Not on file  Social History Narrative   Household:sister and her 3 kids    Drinks occasional starbucks drink       Social Determinants of Health   Financial Resource Strain:   . Difficulty of Paying Living Expenses:   Food Insecurity:   . Worried About Charity fundraiser in the Last Year:   . Arboriculturist in the Last Year:   Transportation Needs:   . Film/video editor (Medical):   Marland Kitchen Lack of Transportation (Non-Medical):   Physical Activity:   . Days of Exercise per Week:   . Minutes of Exercise per Session:   Stress:   . Feeling of Stress :   Social Connections:   . Frequency of Communication with Friends and Family:   . Frequency of Social Gatherings with Friends and Family:   . Attends Religious Services:   . Active Member of Clubs or Organizations:   . Attends Archivist Meetings:   Marland Kitchen Marital Status:      Family History: The patient's family history includes Alcoholism in her mother; Asthma in an other family member; Breast cancer in her maternal aunt, maternal aunt, maternal aunt, and maternal aunt; Cancer in her father and mother; Cancer - Other (age of onset: 69) in her mother; Colon cancer in her paternal grandfather; Colon cancer (age of onset: 64) in her maternal uncle; Diabetes in an other family member; Heart disease in her father; Hypertension in her maternal aunt; Pancreatic cancer in her maternal aunt; Throat cancer in her maternal uncle and mother. There is no history of  Heart attack, Rectal cancer, or Stomach cancer.  ROS:   Review of Systems  Constitution: Positive for malaise/fatigue.  HENT: Negative.   Eyes: Negative.   Cardiovascular: Positive for chest pain, dyspnea on exertion, leg swelling and palpitations.  Respiratory: Positive for cough and shortness of breath.   Endocrine: Negative.   Hematologic/Lymphatic: Negative.   Skin: Negative.   Musculoskeletal: Positive for back pain.  Gastrointestinal: Negative.   Genitourinary: Negative.   Neurological: Positive for dizziness.  Psychiatric/Behavioral: Negative.   Allergic/Immunologic: Negative.    Please see the history of present illness.     All other systems reviewed and are negative.  EKGs/Labs/Other Studies Reviewed:    The following studies were reviewed today:     Recent Labs: 05/21/2019: B Natriuretic Peptide 38.5 08/06/2019: ALT 19; BUN 18; Creatinine, Ser 0.72; Hemoglobin 13.7; Platelets 322; Potassium 4.1; Sodium 136  Recent Lipid Panel    Component Value Date/Time   CHOL 191 05/06/2018 1421   TRIG 93 05/21/2019 2145   HDL 65 05/06/2018 1421   CHOLHDL 3 05/14/2014 1223   VLDL 12.2 05/14/2014 1223   LDLCALC 104 (H) 05/06/2018 1421    Physical Exam:    VS:  BP 98/72   Pulse 88   Temp 98.6 F (37 C)   Ht 5\' 3"  (1.6 m)   Wt 221 lb (100.2 kg)   LMP 08/01/2019   SpO2 96%   BMI 39.15 kg/m  Wt Readings from Last 3 Encounters:  08/14/19 221 lb (100.2 kg)  08/06/19 225 lb (102.1 kg)  06/19/19 219 lb (99.3 kg)     GEN: She appeared breathless with speech well nourished, well developed in no acute distress HEENT: Normal NECK: No JVD; No carotid bruits LYMPHATICS: No lymphadenopathy CARDIAC: RRR, no murmurs, rubs, gallops RESPIRATORY: Full breath sounds she has some coarse rales present at the right base ABDOMEN: Soft, non-tender, non-distended MUSCULOSKELETAL: 2+ ankle to knee bilateral pitting edema; No deformity  SKIN: Warm and dry NEUROLOGIC:  Alert and  oriented x 3 PSYCHIATRIC:  Normal affect     Signed, [name] More, MD  08/14/2019 12:05 PM    [place]

## 2019-08-12 ENCOUNTER — Ambulatory Visit: Payer: BC Managed Care – PPO | Admitting: Cardiology

## 2019-08-13 DIAGNOSIS — R002 Palpitations: Secondary | ICD-10-CM | POA: Diagnosis not present

## 2019-08-13 DIAGNOSIS — Z6839 Body mass index (BMI) 39.0-39.9, adult: Secondary | ICD-10-CM | POA: Diagnosis not present

## 2019-08-13 DIAGNOSIS — K760 Fatty (change of) liver, not elsewhere classified: Secondary | ICD-10-CM | POA: Diagnosis not present

## 2019-08-13 DIAGNOSIS — Z01419 Encounter for gynecological examination (general) (routine) without abnormal findings: Secondary | ICD-10-CM | POA: Diagnosis not present

## 2019-08-13 DIAGNOSIS — N3010 Interstitial cystitis (chronic) without hematuria: Secondary | ICD-10-CM | POA: Diagnosis not present

## 2019-08-13 DIAGNOSIS — Z79899 Other long term (current) drug therapy: Secondary | ICD-10-CM | POA: Diagnosis not present

## 2019-08-14 ENCOUNTER — Encounter: Payer: Self-pay | Admitting: Cardiology

## 2019-08-14 ENCOUNTER — Other Ambulatory Visit: Payer: Self-pay

## 2019-08-14 ENCOUNTER — Ambulatory Visit (INDEPENDENT_AMBULATORY_CARE_PROVIDER_SITE_OTHER): Payer: BC Managed Care – PPO | Admitting: Cardiology

## 2019-08-14 ENCOUNTER — Encounter: Payer: Self-pay | Admitting: Family Medicine

## 2019-08-14 VITALS — BP 98/72 | HR 88 | Temp 98.6°F | Ht 63.0 in | Wt 221.0 lb

## 2019-08-14 DIAGNOSIS — Z6841 Body Mass Index (BMI) 40.0 and over, adult: Secondary | ICD-10-CM | POA: Diagnosis not present

## 2019-08-14 DIAGNOSIS — I493 Ventricular premature depolarization: Secondary | ICD-10-CM

## 2019-08-14 DIAGNOSIS — R079 Chest pain, unspecified: Secondary | ICD-10-CM

## 2019-08-14 DIAGNOSIS — J45909 Unspecified asthma, uncomplicated: Secondary | ICD-10-CM

## 2019-08-14 DIAGNOSIS — Z6839 Body mass index (BMI) 39.0-39.9, adult: Secondary | ICD-10-CM

## 2019-08-14 DIAGNOSIS — R002 Palpitations: Secondary | ICD-10-CM

## 2019-08-14 NOTE — Patient Instructions (Signed)
Medication Instructions:  Your physician recommends that you continue on your current medications as directed. Please refer to the Current Medication list given to you today.  *If you need a refill on your cardiac medications before your next appointment, please call your pharmacy*   Lab Work: TODAY: Pro-BNP, MG  If you have labs (blood work) drawn today and your tests are completely normal, you will receive your results only by: Marland Kitchen MyChart Message (if you have MyChart) OR . A paper copy in the mail If you have any lab test that is abnormal or we need to change your treatment, we will call you to review the results.   Testing/Procedures: Your physician has requested that you have an echocardiogram ASAP. Echocardiography is a painless test that uses sound waves to create images of your heart. It provides your doctor with information about the size and shape of your heart and how well your heart's chambers and valves are working. This procedure takes approximately one hour. There are no restrictions for this procedure.  Your physician has recommended that you wear a 3 Day ZIO monitor. These monitors are medical devices that record the heart's electrical activity. Doctors most often use these monitors to diagnose arrhythmias. Arrhythmias are problems with the speed or rhythm of the heartbeat. The monitor is a small, portable device. You can wear one while you do your normal daily activities. This is usually used to diagnose what is causing palpitations/syncope (passing out).  Follow-Up: At [facility], you and your health needs are our priority.  As part of our continuing mission to provide you with exceptional heart care, we have created designated Provider Care Teams.  These Care Teams include your primary Cardiologist (physician) and Advanced Practice Providers (APPs -  Physician Assistants and Nurse Practitioners) who all work together to provide you with the care you need, when you need  it.  We recommend signing up for the patient portal called "MyChart".  Sign up information is provided on this After Visit Summary.  MyChart is used to connect with patients for Virtual Visits (Telemedicine).  Patients are able to view lab/test results, encounter notes, upcoming appointments, etc.  Non-urgent messages can be sent to your provider as well.   To learn more about what you can do with MyChart, go to [email or web address].    Your next appointment:   3 week(s)  The format for your next appointment:   In Person  Provider:   [name] More, MD or Berniece Salines, DO   Other Instructions

## 2019-08-15 ENCOUNTER — Telehealth: Payer: Self-pay | Admitting: Cardiology

## 2019-08-15 ENCOUNTER — Ambulatory Visit (HOSPITAL_BASED_OUTPATIENT_CLINIC_OR_DEPARTMENT_OTHER)
Admission: RE | Admit: 2019-08-15 | Discharge: 2019-08-15 | Disposition: A | Discharge status: Home / Self Care | Payer: BC Managed Care – PPO | Source: Ambulatory Visit | Attending: Cardiology | Admitting: Cardiology

## 2019-08-15 ENCOUNTER — Other Ambulatory Visit: Payer: Self-pay | Admitting: Cardiology

## 2019-08-15 ENCOUNTER — Ambulatory Visit (INDEPENDENT_AMBULATORY_CARE_PROVIDER_SITE_OTHER): Payer: BC Managed Care – PPO

## 2019-08-15 DIAGNOSIS — Z6841 Body Mass Index (BMI) 40.0 and over, adult: Secondary | ICD-10-CM | POA: Insufficient documentation

## 2019-08-15 DIAGNOSIS — I493 Ventricular premature depolarization: Secondary | ICD-10-CM

## 2019-08-15 DIAGNOSIS — J45909 Unspecified asthma, uncomplicated: Secondary | ICD-10-CM | POA: Insufficient documentation

## 2019-08-15 DIAGNOSIS — R079 Chest pain, unspecified: Secondary | ICD-10-CM | POA: Insufficient documentation

## 2019-08-15 LAB — MAGNESIUM: Magnesium: 2.1 mg/dL (ref 1.6–2.3)

## 2019-08-15 LAB — PRO B NATRIURETIC PEPTIDE: NT-Pro BNP: 86 pg/mL (ref 0–249)

## 2019-08-15 MED ORDER — ACEBUTOLOL HCL 200 MG PO CAPS: 200.0000 mg | ORAL_CAPSULE | Freq: Every day | ORAL | 1 refills | Status: DC

## 2019-08-15 NOTE — Progress Notes (Signed)
  Echocardiogram 2D Echocardiogram has been performed.  Cardell Peach 08/15/2019, 10:26 AM

## 2019-08-15 NOTE — Telephone Encounter (Signed)
I phoned her with results echocardiogram no evidence of cardiac injury from COVID-19.  She discussed she is still quite bothered by PVCs made a decision to put her on a low-dose of a selective beta-blocker and I sent the prescription.

## 2019-08-16 ENCOUNTER — Other Ambulatory Visit: Payer: Self-pay | Admitting: Obstetrics and Gynecology

## 2019-08-16 DIAGNOSIS — K760 Fatty (change of) liver, not elsewhere classified: Secondary | ICD-10-CM

## 2019-08-18 ENCOUNTER — Other Ambulatory Visit: Payer: Self-pay | Admitting: Family Medicine

## 2019-08-18 DIAGNOSIS — R05 Cough: Secondary | ICD-10-CM

## 2019-08-18 DIAGNOSIS — R059 Cough, unspecified: Secondary | ICD-10-CM

## 2019-08-19 ENCOUNTER — Encounter: Payer: Self-pay | Admitting: Adult Health

## 2019-08-19 ENCOUNTER — Encounter: Payer: Self-pay | Admitting: Family Medicine

## 2019-08-19 ENCOUNTER — Other Ambulatory Visit: Payer: Self-pay

## 2019-08-19 ENCOUNTER — Ambulatory Visit (INDEPENDENT_AMBULATORY_CARE_PROVIDER_SITE_OTHER): Payer: BC Managed Care – PPO | Admitting: Family Medicine

## 2019-08-19 ENCOUNTER — Telehealth (INDEPENDENT_AMBULATORY_CARE_PROVIDER_SITE_OTHER): Payer: BC Managed Care – PPO | Admitting: Adult Health

## 2019-08-19 ENCOUNTER — Telehealth: Payer: Self-pay | Admitting: Family Medicine

## 2019-08-19 DIAGNOSIS — R059 Cough, unspecified: Secondary | ICD-10-CM

## 2019-08-19 DIAGNOSIS — J4540 Moderate persistent asthma, uncomplicated: Secondary | ICD-10-CM

## 2019-08-19 DIAGNOSIS — J40 Bronchitis, not specified as acute or chronic: Secondary | ICD-10-CM

## 2019-08-19 DIAGNOSIS — R05 Cough: Secondary | ICD-10-CM

## 2019-08-19 DIAGNOSIS — J0141 Acute recurrent pansinusitis: Secondary | ICD-10-CM

## 2019-08-19 DIAGNOSIS — R0602 Shortness of breath: Secondary | ICD-10-CM

## 2019-08-19 MED ORDER — PREDNISONE 10 MG PO TABS: ORAL_TABLET | ORAL | 0 refills | Status: DC

## 2019-08-19 MED ORDER — BENZONATATE 200 MG PO CAPS: 200.0000 mg | ORAL_CAPSULE | Freq: Two times a day (BID) | ORAL | 0 refills | Status: DC | PRN

## 2019-08-19 MED ORDER — AMOXICILLIN-POT CLAVULANATE 875-125 MG PO TABS: 1.0000 | ORAL_TABLET | Freq: Two times a day (BID) | ORAL | 0 refills | Status: DC

## 2019-08-19 NOTE — Patient Instructions (Addendum)
Take Augmentin as directed from PCP  Prednisone taper over next week.  Mucinex Twice daily  As needed  Cough/congestion  Delsym 2 tsp Twice daily  As needed  Cough .  Tessalon Three times a day  As needed  Cough  BRETRI 2 puffs Twice daily  , rinse after use.  Follow up with PFT in 4 weeks and As needed   COVID vaccine when available.  Please contact office for sooner follow up if symptoms do not improve or worsen or seek emergency care

## 2019-08-19 NOTE — Telephone Encounter (Signed)
Patient would like to discuss forms sent in last week in regards to her heart and lungs.   Please advise     Pt call back # [phone]

## 2019-08-19 NOTE — Telephone Encounter (Signed)
She has virtual visit today

## 2019-08-19 NOTE — Progress Notes (Signed)
Virtual Visit via Telephone Note  I connected with Morgan Long on 08/19/19 at  4:30 PM EDT by telephone and verified that I am speaking with the correct person using two identifiers.  Location: Patient: Home  Provider: Office    I discussed the limitations, risks, security and privacy concerns of performing an evaluation and management service by telephone and the availability of in person appointments. I also discussed with the patient that there may be a patient responsible charge related to this service. The patient expressed understanding and agreed to proceed.   History of Present Illness: 49 year old female seen for pulmonary consult 03/06/2019 for asthma and shortness of breath along with an abnormal chest x-ray (CT chest normal)  Today's televisit is for an acute office visit for cough and shortness of breath.  Patient complains he has had increased cough sinus congestion sinus pressure and wheezing and shortness of breath.  Patient developed COVID-19 infection in December complicated by [id number] pneumonia.  Has had a slow recovery and continues to have persistent symptoms since December.  She is also had some shortness of breath chest pain.  She is seen by cardiology underwent an echo last week that showed preserved EF, normal right ventricular systolic function and pulmonary artery pressure.  D-dimer was negative.  Patient is also had a 3-day Zio patch.  Results are pending.  Chest x-ray on March 3 showed no acute process and well aerated lungs bilaterally.Marland Kitchen She was seen by her primary care provider this morning and given a 10-day course of Augmentin for suspected sinusitis.   Observations/Objective: Appears in no acute distress.  Speaks in full sentences with no audible wheezing.  Assessment and Plan: Recurrent asthmatic bronchitic flares-short prednisone taper, continue on maintenance regimen.  Check PFTs on return Acute sinusitis-take antibiotics as prescribed per PCP Post  COVID-19 symptomology with shortness of breath, cough-work-up has been unrevealing with normal echo, D-dimer.  Zio patch is pending.  Chest x-ray shows clearance of pneumonia We will check PFTs on return.  Plan  Patient Instructions  Take Augmentin as directed from PCP  Prednisone taper over next week.  Mucinex Twice daily  As needed  Cough/congestion  Delsym 2 tsp Twice daily  As needed  Cough .  Tessalon Three times a day  As needed  Cough  BRETRI 2 puffs Twice daily  , rinse after use.  Follow up with PFT in 4 weeks and As needed   COVID vaccine when available.  Please contact office for sooner follow up if symptoms do not improve or worsen or seek emergency care       Follow Up Instructions:    I discussed the assessment and treatment plan with the patient. The patient was provided an opportunity to ask questions and all were answered. The patient agreed with the plan and demonstrated an understanding of the instructions.   The patient was advised to call back or seek an in-person evaluation if the symptoms worsen or if the condition fails to improve as anticipated.  I provided 25  minutes of non-face-to-face time during this encounter.   Rexene Edison, NP

## 2019-08-19 NOTE — Progress Notes (Signed)
Virtual Visit via Video Note  I connected with Morgan Long on 08/19/19 at  1:40 PM EDT by a video enabled telemedicine application and verified that I am speaking with the correct person using two identifiers.  Location: Patient: home  Provider: office    I discussed the limitations of evaluation and management by telemedicine and the availability of in person appointments. The patient expressed understanding and agreed to proceed.  History of Present Illness: Pt is home c/o sore throat , cough.  + mucus yellow   She has a hx covid pneumonia in dec and has seen cardiology for cp and pulmonary    Past Medical History:  Diagnosis Date  . Allergic rhinitis   . Asthma   . Chronic migraine    Dr. Catalina Gravel  . Constipation   . Dry eye syndrome of both lacrimal glands   . Dry skin   . Endometriosis   . Fatigue   . Gastroparesis   . GERD (gastroesophageal reflux disease)   . Headache(784.0)    occasional, dx w/ Migraines before, Topamax helps  . Heartburn   . History of stomach ulcers   . Lactose intolerance   . Localized edema   . Migraine   . Muscle weakness (generalized)   . Nuclear cataract of both eyes    Mild  . Overweight   . Pain in right ankle and joints of right foot   . Shortness of breath   . Sinus complaint   . Sinusitis   . Tired   . TMJ pain dysfunction syndrome    occasional   Current Outpatient Medications on File Prior to Visit  Medication Sig Dispense Refill  . acebutolol (SECTRAL) 200 MG capsule Take 1 capsule (200 mg total) by mouth daily. For palpitation and PVC's 30 capsule 1  . albuterol (PROAIR HFA) 108 (90 Base) MCG/ACT inhaler Inhale 2 puffs into the lungs every 6 (six) hours as needed for wheezing or shortness of breath. 18 g 2  . azelastine (ASTELIN) 0.1 % nasal spray PLACE 2 SPRAYS INTO BOTH NOSTRILS 2 (TWO) TIMES DAILY. 90 mL 0  . BD PEN NEEDLE NANO U/F 32G X 4 MM MISC 1 PACKAGE BY DOES NOT APPLY ROUTE 2 (TWO) TIMES DAILY. 100 each 0  .  bimatoprost (LATISSE) 0.03 % ophthalmic solution APPLY ALONG UPPER EYELID MARGIN AT BASE OF EYELASHES ONCE A DAY    . cetirizine (ZYRTEC) 10 MG tablet TAKE 1 TABLET BY MOUTH DAILY 30 tablet 5  . ESTROGEL 0.75 MG/1.25 GM (0.06%) topical gel Apply 1 application topically See admin instructions.  0  . etonogestrel-ethinyl estradiol (NUVARING) 0.12-0.015 MG/24HR vaginal ring Place 1 each vaginally every 28 (twenty-eight) days. Insert vaginally and leave in place for 3 consecutive weeks, then remove for 1 week.     . hydrochlorothiazide (HYDRODIURIL) 25 MG tablet Take 1 tablet (25 mg total) by mouth daily. 90 tablet 3  . ibuprofen (ADVIL,MOTRIN) 800 MG tablet Take 800 mg by mouth every 6 (six) hours as needed. for pain  3  . levocetirizine (XYZAL) 5 MG tablet TAKE 1 TABLET (5 MG TOTAL) BY MOUTH DAILY AS NEEDED FOR ALLERGIES. 90 tablet 1  . linaclotide (LINZESS) 72 MCG capsule Take 1 capsule (72 mcg total) by mouth daily before breakfast. 30 capsule 6  . meloxicam (MOBIC) 15 MG tablet Take by mouth.    . Multiple Vitamin (MULTIVITAMIN) LIQD Take 5 mLs by mouth daily.    . ondansetron (ZOFRAN ODT) 4 MG disintegrating  tablet Take 1 tablet (4 mg total) by mouth every 8 (eight) hours as needed for nausea or vomiting. 6 tablet 0  . PAZEO 0.7 % SOLN     . phentermine 15 MG capsule Take 1 capsule (15 mg total) by mouth every morning. 30 capsule 0  . PREVIDENT 5000 SENSITIVE 1.1-5 % PSTE USE BEFORE BED DO NOT RINSE    . promethazine (PHENERGAN) 12.5 MG tablet Take 1 tablet (12.5 mg total) by mouth every 6 (six) hours as needed for nausea or vomiting. 30 tablet 1  . promethazine-dextromethorphan (PROMETHAZINE-DM) 6.25-15 MG/5ML syrup TAKE 5MLS BY MOUTH 4 TIMES DAILY AS NEEDED 118 mL 0  . RESTASIS MULTIDOSE 0.05 % ophthalmic emulsion Place 1 drop into both eyes as needed.  3  . rizatriptan (MAXALT-MLT) 10 MG disintegrating tablet TAKE 1 TABLET (10 MG TOTAL) BY MOUTH AS NEEDED FOR MIGRAINE. MAY REPEAT IN 2 HOURS IF  NEEDED 9 tablet 11  . SAXENDA 18 MG/3ML SOPN INJECT 3 MG INTO THE SKIN DAILY. (NOT COVERED) CMD 15 mL 0  . spironolactone (ALDACTONE) 25 MG tablet Take 25 mg by mouth 2 (two) times daily.    Marland Kitchen Ubrogepant ([name]) 50 MG TABS Take 50 mg by mouth 2 (two) times daily as needed (1 pill at onset of migraine, may repeat in 2 hours. no more than 4 pills a day). 10 tablet 3  . Vitamin D, Ergocalciferol, (DRISDOL) 1.25 MG (50000 UT) CAPS capsule Take 1 capsule (50,000 Units total) by mouth every 7 (seven) days. 4 capsule 0   No current facility-administered medications on file prior to visit.   Allergies  Allergen Reactions  . Hydrocodone Nausea Only  . Oxycodone Itching  . Codeine Itching and Nausea Only    Observations/Objective: There were no vitals filed for this visit. 99 temp Pt is in nad No sob  Assessment and Plan: 1. Acute recurrent pansinusitis abx  flonase  Pt will get covid test tomorrow  - amoxicillin-clavulanate (AUGMENTIN) 875-125 MG tablet; Take 1 tablet by mouth 2 (two) times daily.  Dispense: 20 tablet; Refill: 0  2. Cough Per orders  - benzonatate (TESSALON) 200 MG capsule; Take 1 capsule (200 mg total) by mouth 2 (two) times daily as needed for cough.  Dispense: 20 capsule; Refill: 0  Follow Up Instructions:    I discussed the assessment and treatment plan with the patient. The patient was provided an opportunity to ask questions and all were answered. The patient agreed with the plan and demonstrated an understanding of the instructions.   The patient was advised to call back or seek an in-person evaluation if the symptoms worsen or if the condition fails to improve as anticipated.  I provided 30 minutes of non-face-to-face time during this encounter.   [name], DO

## 2019-08-19 NOTE — Patient Instructions (Signed)
Acute Bronchitis, Adult  Acute bronchitis is sudden or acute swelling of the air tubes (bronchi) in the lungs. Acute bronchitis causes these tubes to fill with mucus, which can make it hard to breathe. It can also cause coughing or wheezing. In adults, acute bronchitis usually goes away within 2 weeks. A cough caused by bronchitis may last up to 3 weeks. Smoking, allergies, and asthma can make the condition worse. What are the causes? This condition can be caused by germs and by substances that irritate the lungs, including:  Cold and flu viruses. The most common cause of this condition is the virus that causes the common cold.  Bacteria.  Substances that irritate the lungs, including: ? Smoke from cigarettes and other forms of tobacco. ? Dust and pollen. ? Fumes from chemical products, gases, or burned fuel. ? Other materials that pollute indoor or outdoor air.  Close contact with someone who has acute bronchitis. What increases the risk? The following factors may make you more likely to develop this condition:  A weak body's defense system, also called the immune system.  A condition that affects your lungs and breathing, such as asthma. What are the signs or symptoms? Common symptoms of this condition include:  Lung and breathing problems, such as: ? Coughing. This may bring up clear, yellow, or green mucus from your lungs (sputum). ? Wheezing. ? Having too much mucus in your lungs (chest congestion). ? Having shortness of breath.  A fever.  Chills.  Aches and pains, including: ? Tightness in your chest and other body aches. ? A sore throat. How is this diagnosed? This condition is usually diagnosed based on:  Your symptoms and medical history.  A physical exam. You may also have other tests, including tests to rule out other conditions, such pneumonia. These tests include:  A test of lung function.  Test of a mucus sample to look for the presence of  bacteria.  Tests to check the oxygen level in your blood.  Blood tests.  Chest X-ray. How is this treated? Most cases of acute bronchitis clear up over time without treatment. Your health care provider may recommend:  Drinking more fluids. This can thin your mucus, which may improve your breathing.  Taking a medicine for a fever or cough.  Using a device that gets medicine into your lungs (inhaler) to help improve breathing and control coughing.  Using a vaporizer or a humidifier. These are machines that add water to the air to help you breathe better. Follow these instructions at home: Activity  Get plenty of rest.  Return to your normal activities as told by your health care provider. Ask your health care provider what activities are safe for you. Lifestyle  Drink enough fluid to keep your urine pale yellow.  Do not drink alcohol.  Do not use any products that contain nicotine or tobacco, such as cigarettes, e-cigarettes, and chewing tobacco. If you need help quitting, ask your health care provider. Be aware that: ? Your bronchitis will get worse if you smoke or breathe in other people's smoke (secondhand smoke). ? Your lungs will heal faster if you quit smoking. General instructions   Take over-the-counter and prescription medicines only as told by your health care provider.  Use an inhaler, vaporizer, or humidifier as told by your health care provider.  If you have a sore throat, gargle with a salt-water mixture 3-4 times a day or as needed. To make a salt-water mixture, completely dissolve -1 tsp (3-6   g) of salt in 1 cup (237 mL) of warm water.  Keep all follow-up visits as told by your health care provider. This is important. How is this prevented? To lower your risk of getting this condition again:  Wash your hands often with soap and water. If soap and water are not available, use hand sanitizer.  Avoid contact with people who have cold symptoms.  Try not to  touch your mouth, nose, or eyes with your hands.  Avoid places where there are fumes from chemicals. Breathing these fumes will make your condition worse.  Get the flu shot every year. Contact a health care provider if:  Your symptoms do not improve after 2 weeks of treatment.  You vomit more than once or twice.  You have symptoms of dehydration such as: ? Dark urine. ? Dry skin or eyes. ? Increased thirst. ? Headaches. ? Confusion. ? Muscle cramps. Get help right away if you:  Cough up blood.  Feel pain in your chest.  Have severe shortness of breath.  Faint or keep feeling like you are going to faint.  Have a severe headache.  Have fever or chills that get worse. These symptoms may represent a serious problem that is an emergency. Do not wait to see if the symptoms will go away. Get medical help right away. Call your local emergency services (911 in the [place] Do not drive yourself to the hospital. Summary  Acute bronchitis is sudden (acute) inflammation of the air tubes (bronchi) between the windpipe and the lungs. In adults, acute bronchitis usually goes away within 2 weeks, although coughing may last 3 weeks or longer  Take over-the-counter and prescription medicines only as told by your health care provider.  Drink enough fluid to keep your urine pale yellow.  Contact a health care provider if your symptoms do not improve after 2 weeks of treatment.  Get help right away if you cough up blood, faint, or have chest pain or shortness of breath. This information is not intended to replace advice given to you by your health care provider. Make sure you discuss any questions you have with your health care provider. Document Revised: 02/03/2019 Document Reviewed: 12/13/2018 [employer] Patient Education  2020 [employer]  

## 2019-08-20 ENCOUNTER — Other Ambulatory Visit (HOSPITAL_BASED_OUTPATIENT_CLINIC_OR_DEPARTMENT_OTHER): Payer: BC Managed Care – PPO

## 2019-08-20 ENCOUNTER — Ambulatory Visit: Payer: BC Managed Care – PPO | Attending: Internal Medicine

## 2019-08-20 DIAGNOSIS — Z20822 Contact with and (suspected) exposure to covid-19: Secondary | ICD-10-CM

## 2019-08-21 ENCOUNTER — Telehealth: Payer: Self-pay | Admitting: Family Medicine

## 2019-08-21 ENCOUNTER — Telehealth: Payer: Self-pay | Admitting: Adult Health

## 2019-08-21 LAB — NOVEL CORONAVIRUS, NAA: SARS-CoV-2, NAA: NOT DETECTED

## 2019-08-21 NOTE — Telephone Encounter (Signed)
Patient requesting a return call about paper work.

## 2019-08-21 NOTE — Telephone Encounter (Signed)
Left message for patient to call back  

## 2019-08-22 ENCOUNTER — Telehealth: Payer: Self-pay | Admitting: Adult Health

## 2019-08-22 NOTE — Telephone Encounter (Signed)
Faythe Ghee sorry that happened. That's okay  Just start with the taper as should be today . Begin with Prednisone 30mg  (3 tabs ) today and then taper.  How is she feeling   Please contact office for sooner follow up if symptoms do not improve or worsen or seek emergency care

## 2019-08-22 NOTE — Telephone Encounter (Signed)
Duplicate encounter.  Please see 08/21/19 phone note for further documentation.  Will close encounter.

## 2019-08-22 NOTE — Telephone Encounter (Signed)
Spoke with pt,aware of recs.  Nothing further needed.  

## 2019-08-22 NOTE — Telephone Encounter (Signed)
Pt called back, states she had misread prednisone rx and on 3/17 took 4 tabs of prednisone BID (directions stated to take 4 tabs daily for 2 days).  Pt did not take any prednisone yesterday.  Pt wants to clarify if she should resume the 3 tabs daily today.    TP please advise.  Thanks!

## 2019-08-28 DIAGNOSIS — I493 Ventricular premature depolarization: Secondary | ICD-10-CM | POA: Diagnosis not present

## 2019-09-01 ENCOUNTER — Encounter: Payer: Self-pay | Admitting: Family Medicine

## 2019-09-01 ENCOUNTER — Telehealth: Payer: Self-pay | Admitting: Family Medicine

## 2019-09-01 NOTE — Telephone Encounter (Signed)
Patient came  into office ,Document was faxed 2 weeks ago , to be filled out by Dr. Etter Sjogren. Patient called 3/16 to have formed filled out . Patient stated no one called her back regarding form . Form is due tomorrow 09/02/19. Patient would like form to be filled out & faxed . Patient would like to be notified when form is faxed. If work form cant be completed by tomorrow , please call to let her know [phone]

## 2019-09-01 NOTE — Telephone Encounter (Signed)
Received

## 2019-09-02 ENCOUNTER — Telehealth: Payer: Self-pay | Admitting: Emergency Medicine

## 2019-09-02 NOTE — Telephone Encounter (Signed)
I'm so sorry it is worse---  she should consider going to the ER if the symptoms worsen

## 2019-09-02 NOTE — Telephone Encounter (Signed)
Forms faxed this morning.

## 2019-09-02 NOTE — Telephone Encounter (Signed)
Left message for patient to return call regarding test results.  

## 2019-09-04 ENCOUNTER — Other Ambulatory Visit: Payer: Self-pay

## 2019-09-04 ENCOUNTER — Ambulatory Visit (INDEPENDENT_AMBULATORY_CARE_PROVIDER_SITE_OTHER): Payer: BC Managed Care – PPO | Admitting: Cardiology

## 2019-09-04 ENCOUNTER — Encounter: Payer: Self-pay | Admitting: Cardiology

## 2019-09-04 VITALS — BP 118/70 | HR 89 | Ht 63.0 in | Wt 225.0 lb

## 2019-09-04 DIAGNOSIS — Z8616 Personal history of COVID-19: Secondary | ICD-10-CM

## 2019-09-04 DIAGNOSIS — R600 Localized edema: Secondary | ICD-10-CM | POA: Diagnosis not present

## 2019-09-04 DIAGNOSIS — R0602 Shortness of breath: Secondary | ICD-10-CM | POA: Diagnosis not present

## 2019-09-04 DIAGNOSIS — I493 Ventricular premature depolarization: Secondary | ICD-10-CM | POA: Diagnosis not present

## 2019-09-04 MED ORDER — POTASSIUM CHLORIDE CRYS ER 20 MEQ PO TBCR: 20.0000 meq | EXTENDED_RELEASE_TABLET | Freq: Every day | ORAL | 6 refills | Status: DC

## 2019-09-04 MED ORDER — FUROSEMIDE 20 MG PO TABS: 20.0000 mg | ORAL_TABLET | Freq: Every day | ORAL | 6 refills | Status: DC

## 2019-09-04 NOTE — Progress Notes (Signed)
Cardiology Office Note:    Date:  09/04/2019   ID:  Morgan Long, DOB Dec 19, 1970, MRN [id number]  PCP:  [name], DO  Cardiologist:  No primary care provider on file.  Electrophysiologist:  None   Referring MD: Carollee Herter, Alferd Apa, *   Chief Complaint  Patient presents with  . Follow-up   History of Present Illness:    Morgan Long is a 49 y.o. 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  On her initial appointment due to concerns for frequent PVCs ZIO monitor was placed on the patient which showed 7.8 burden or frequent  PVCs.  She was encouraged to continue on her acebutolol as prescribed.  An echocardiogram was also done which showed LVEF of 60 to 65% with mild mitral regurgitation.  She is here today for follow-up visit she tells me that she has been experiencing intermittent palpitations especially at nighttime.  During further conversation with the patient I learned that she is not taking her acebutolol as she is concerned that this may make her lightheaded.  Past Medical History:  Diagnosis Date  . Allergic rhinitis   . Asthma   . Chronic migraine    Dr. Catalina Gravel  . Constipation   . Dry eye syndrome of both lacrimal glands   . Dry skin   . Endometriosis   . Fatigue   . Gastroparesis   . GERD (gastroesophageal reflux disease)   . Headache(784.0)    occasional, dx w/ Migraines before, Topamax helps  . Heartburn   . History of stomach ulcers   . Lactose intolerance   . Localized edema   . Migraine   . Muscle weakness (generalized)   . Nuclear cataract of both eyes    Mild  . Overweight   . Pain in right ankle and joints of right foot   . Shortness of breath   . Sinus complaint   . Sinusitis   . Tired   . TMJ pain dysfunction syndrome    occasional    Past Surgical History:  Procedure Laterality Date  . ANKLE SURGERY Left 11/16/2017    . AUGMENTATION MAMMAPLASTY Bilateral 2006  . BREAST ENHANCEMENT SURGERY  2006  . BUNIONECTOMY    . COLONOSCOPY    . endrometroisis    . fallopian tube removed     Left  . UPPER GASTROINTESTINAL ENDOSCOPY    . wisdoim teeth extraction      Current Medications: Current Meds  Medication Sig  . acebutolol (SECTRAL) 200 MG capsule Take 1 capsule (200 mg total) by mouth daily. For palpitation and PVC's  . albuterol (PROAIR HFA) 108 (90 Base) MCG/ACT inhaler Inhale 2 puffs into the lungs every 6 (six) hours as needed for wheezing or shortness of breath.  Marland Kitchen azelastine (ASTELIN) 0.1 % nasal spray PLACE 2 SPRAYS INTO BOTH NOSTRILS 2 (TWO) TIMES DAILY.  . BD PEN NEEDLE NANO U/F 32G X 4 MM MISC 1 PACKAGE BY DOES NOT APPLY ROUTE 2 (TWO) TIMES DAILY.  . benzonatate (TESSALON) 200 MG capsule Take 1 capsule (200 mg total) by mouth 2 (two) times daily as needed for cough.  . bimatoprost (LATISSE) 0.03 % ophthalmic solution APPLY ALONG UPPER EYELID MARGIN AT BASE OF EYELASHES ONCE A DAY  . cetirizine (ZYRTEC) 10 MG tablet TAKE 1 TABLET BY MOUTH DAILY  . ESTROGEL 0.75 MG/1.25 GM (0.06%) topical gel Apply  1 application topically See admin instructions.  Marland Kitchen etonogestrel-ethinyl estradiol (NUVARING) 0.12-0.015 MG/24HR vaginal ring Place 1 each vaginally every 28 (twenty-eight) days. Insert vaginally and leave in place for 3 consecutive weeks, then remove for 1 week.   . hydrochlorothiazide (HYDRODIURIL) 25 MG tablet Take 1 tablet (25 mg total) by mouth daily.  Marland Kitchen ibuprofen (ADVIL,MOTRIN) 800 MG tablet Take 800 mg by mouth every 6 (six) hours as needed. for pain  . levocetirizine (XYZAL) 5 MG tablet TAKE 1 TABLET (5 MG TOTAL) BY MOUTH DAILY AS NEEDED FOR ALLERGIES.  Marland Kitchen linaclotide (LINZESS) 72 MCG capsule Take 1 capsule (72 mcg total) by mouth daily before breakfast.  . meloxicam (MOBIC) 15 MG tablet Take by mouth.  . Multiple Vitamin (MULTIVITAMIN) LIQD Take 5 mLs by mouth daily.  . ondansetron (ZOFRAN ODT) 4  MG disintegrating tablet Take 1 tablet (4 mg total) by mouth every 8 (eight) hours as needed for nausea or vomiting.  Marland Kitchen PAZEO 0.7 % SOLN   . phentermine 15 MG capsule Take 1 capsule (15 mg total) by mouth every morning.  Marland Kitchen PREVIDENT 5000 SENSITIVE 1.1-5 % PSTE USE BEFORE BED DO NOT RINSE  . promethazine (PHENERGAN) 12.5 MG tablet Take 1 tablet (12.5 mg total) by mouth every 6 (six) hours as needed for nausea or vomiting.  . promethazine-dextromethorphan (PROMETHAZINE-DM) 6.25-15 MG/5ML syrup TAKE 5MLS BY MOUTH 4 TIMES DAILY AS NEEDED  . RESTASIS MULTIDOSE 0.05 % ophthalmic emulsion Place 1 drop into both eyes as needed.  . rizatriptan (MAXALT-MLT) 10 MG disintegrating tablet TAKE 1 TABLET (10 MG TOTAL) BY MOUTH AS NEEDED FOR MIGRAINE. MAY REPEAT IN 2 HOURS IF NEEDED  . SAXENDA 18 MG/3ML SOPN INJECT 3 MG INTO THE SKIN DAILY. (NOT COVERED) CMD  . spironolactone (ALDACTONE) 25 MG tablet Take 25 mg by mouth 2 (two) times daily.  Marland Kitchen Ubrogepant ([name]) 50 MG TABS Take 50 mg by mouth 2 (two) times daily as needed (1 pill at onset of migraine, may repeat in 2 hours. no more than 4 pills a day).  . Vitamin D, Ergocalciferol, (DRISDOL) 1.25 MG (50000 UT) CAPS capsule Take 1 capsule (50,000 Units total) by mouth every 7 (seven) days.  . [DISCONTINUED] predniSONE (DELTASONE) 10 MG tablet 4 tabs for 2 days, then 3 tabs for 2 days, 2 tabs for 2 days, then 1 tab for 2 days, then stop     Allergies:   Hydrocodone, Oxycodone, and Codeine   Social History   Socioeconomic History  . Marital status: Single    Spouse name: Not on file  . Number of children: 0  . Years of education: BS  . Highest education level: Not on file  Occupational History  . Occupation: Paramedic, going to school    Employer: [employer]  . Occupation: Air traffic controller  Tobacco Use  . Smoking status: Never Smoker  . Smokeless tobacco: Never Used  Substance and Sexual Activity  . Alcohol use: Yes    Alcohol/week:  0.0 standard drinks    Comment: socially - occasional   . Drug use: No  . Sexual activity: Yes    Birth control/protection: Inserts    Comment: nuvaring  Other Topics Concern  . Not on file  Social History Narrative   Household:sister and her 3 kids    Drinks occasional starbucks drink       Social Determinants of Health   Financial Resource Strain:   . Difficulty of Paying Living Expenses:   Food  Insecurity:   . Worried About Charity fundraiser in the Last Year:   . Arboriculturist in the Last Year:   Transportation Needs:   . Film/video editor (Medical):   Marland Kitchen Lack of Transportation (Non-Medical):   Physical Activity:   . Days of Exercise per Week:   . Minutes of Exercise per Session:   Stress:   . Feeling of Stress :   Social Connections:   . Frequency of Communication with Friends and Family:   . Frequency of Social Gatherings with Friends and Family:   . Attends Religious Services:   . Active Member of Clubs or Organizations:   . Attends Archivist Meetings:   Marland Kitchen Marital Status:      Family History: The patient's family history includes Alcoholism in her mother; Asthma in an other family member; Breast cancer in her maternal aunt, maternal aunt, maternal aunt, and maternal aunt; Cancer in her father and mother; Cancer - Other (age of onset: 65) in her mother; Colon cancer in her paternal grandfather; Colon cancer (age of onset: 75) in her maternal uncle; Diabetes in an other family member; Heart disease in her father; Hypertension in her maternal aunt; Pancreatic cancer in her maternal aunt; Throat cancer in her maternal uncle and mother. There is no history of Heart attack, Rectal cancer, or Stomach cancer.  ROS:   Review of Systems  Constitution: Negative for decreased appetite, fever and weight gain.  HENT: Negative for congestion, ear discharge, hoarse voice and sore throat.   Eyes: Negative for discharge, redness, vision loss in right eye and visual  halos.  Cardiovascular: Reports palpitations.  Negative for chest pain, dyspnea on exertion, leg swelling, orthopnea. Respiratory: Negative for cough, hemoptysis, shortness of breath and snoring.   Endocrine: Negative for heat intolerance and polyphagia.  Hematologic/Lymphatic: Negative for bleeding problem. Does not bruise/bleed easily.  Skin: Negative for flushing, nail changes, rash and suspicious lesions.  Musculoskeletal: Negative for arthritis, joint pain, muscle cramps, myalgias, neck pain and stiffness.  Gastrointestinal: Negative for abdominal pain, bowel incontinence, diarrhea and excessive appetite.  Genitourinary: Negative for decreased libido, genital sores and incomplete emptying.  Neurological: Negative for brief paralysis, focal weakness, headaches and loss of balance.  Psychiatric/Behavioral: Negative for altered mental status, depression and suicidal ideas.  Allergic/Immunologic: Negative for HIV exposure and persistent infections.    EKGs/Labs/Other Studies Reviewed:    The following studies were reviewed today:   EKG: None today  Transthoracic echocardiogram August 15, 2019 IMPRESSIONS  1. Left ventricular ejection fraction, by estimation, is 60 to 65%. The  left ventricle has normal function. The left ventricle has no regional  wall motion abnormalities. Left ventricular diastolic parameters were  normal.  2. Right ventricular systolic function is normal. The right ventricular  size is normal. There is normal pulmonary artery systolic pressure.  3. The mitral valve is normal in structure. Mild mitral valve  regurgitation. No evidence of mitral stenosis.  4. The aortic valve is tricuspid. Aortic valve regurgitation is not  visualized. No aortic stenosis is present.  5. The inferior vena cava is normal in size with greater than 50%  respiratory variability, suggesting right atrial pressure of 3 mmHg  ZIO monitor: A ZIO monitor was performed 3 days 11 hours  beginning 08/15/2019 to assess palpitation.  Predominant rhythm was sinus with minimum average and maximum heart rates of 71, 101 and 141 bpm.  There were no pauses of 3 seconds or greater and no  episodes of second or third-degree AV nodal or sinus node exit block.  Supraventricular ectopy was rare without atrial fibrillation flutter or SVT.  Ventricular ectopy was frequent with PVCs 7.8% burden and no episodes of couplets triplets or ventricular tachycardia.  There were 14 triggered and 5 diary events associated with frequent PVCs and at times bigeminy   Conclusion frequent single PVCs associated with triggered and diary events.  Recent Labs: 05/21/2019: B Natriuretic Peptide 38.5 08/06/2019: ALT 19; BUN 18; Creatinine, Ser 0.72; Hemoglobin 13.7; Platelets 322; Potassium 4.1; Sodium 136 08/14/2019: Magnesium 2.1; NT-Pro BNP 86  Recent Lipid Panel    Component Value Date/Time   CHOL 191 05/06/2018 1421   TRIG 93 05/21/2019 2145   HDL 65 05/06/2018 1421   CHOLHDL 3 05/14/2014 1223   VLDL 12.2 05/14/2014 1223   LDLCALC 104 (H) 05/06/2018 1421    Physical Exam:    VS:  BP 118/70 (BP Location: Left Arm, Patient Position: Sitting, Cuff Size: Large)   Pulse 89   Ht 5' 3" (1.6 m)   Wt 225 lb (102.1 kg)   SpO2 99%   BMI 39.86 kg/m     Wt Readings from Last 3 Encounters:  09/04/19 225 lb (102.1 kg)  08/14/19 221 lb (100.2 kg)  08/06/19 225 lb (102.1 kg)     GEN: Well nourished, well developed in no acute distress HEENT: Normal NECK: No JVD; No carotid bruits LYMPHATICS: No lymphadenopathy CARDIAC: S1S2 noted,RRR, no murmurs, rubs, gallops RESPIRATORY:  Clear to auscultation without rales, wheezing or rhonchi  ABDOMEN: Soft, non-tender, non-distended, +bowel sounds, no guarding. EXTREMITIES: No edema, No cyanosis, no clubbing MUSCULOSKELETAL:  No deformity  SKIN: Warm and dry NEUROLOGIC:  Alert and oriented x 3, non-focal PSYCHIATRIC:  Normal affect, good  insight  ASSESSMENT:    1. Frequent PVCs   2. Bilateral leg edema   3. Shortness of breath   4. History of COVID-19    PLAN:     She is still experiencing palpitations unfortunately the patient has not been taking her acebutolol as she was prescribed.  I have advised the patient that she may have clinical improvement if she start taking this medication. I was able to again review her monitor results with her showing that she does have frequent 7.8% burden PVCs.  All of her questions were answered.  She does have bilateral leg edema +1 pitting with her shortness of breath I am going to start the patient on Lasix 20 mg daily with potassium 20 mEq supplement.  She has been ordered hydrochlorothiazide in the past as she tells me dropped her blood pressure too low therefore she does now wish to take this medicine and had not been taking it therefore I am going to stop it.  Of educated the patient to take her blood pressure prior to taking her Lasix dose and if her systolic blood pressures less than 110 to hold her Lasix dosing.  She understands this instruction using a teach back method.  Blood work will be done today which will include BMP, mag and BNP.   The patient is in agreement with the above plan. The patient left the office in stable condition.  The patient will follow up in 1 month or sooner if needed.   Medication Adjustments/Labs and Tests Ordered: Current medicines are reviewed at length with the patient today.  Concerns regarding medicines are outlined above.  No orders of the defined types were placed in this encounter.  No orders of  the defined types were placed in this encounter.   Patient Instructions  Medication Instructions:  Your physician has recommended you make the following change in your medication:   Stop Hydrochlorothiazide Start Lasix 20 mg daily Start KCL 20 meq daily  Please take your blood pressure before taking Lasix. Hold the lasix if your systolic  (top number) is 110 or less.   *If you need a refill on your cardiac medications before your next appointment, please call your pharmacy*   Lab Work: Your physician recommends that you have labs done today in the office. We are doing a BMET, Mg and MNP.  If you have labs (blood work) drawn today and your tests are completely normal, you will receive your results only by: Marland Kitchen MyChart Message (if you have MyChart) OR . A paper copy in the mail If you have any lab test that is abnormal or we need to change your treatment, we will call you to review the results.   Testing/Procedures: None ordered   Follow-Up: At [facility], you and your health needs are our priority.  As part of our continuing mission to provide you with exceptional heart care, we have created designated Provider Care Teams.  These Care Teams include your primary Cardiologist (physician) and Advanced Practice Providers (APPs -  Physician Assistants and Nurse Practitioners) who all work together to provide you with the care you need, when you need it.  We recommend signing up for the patient portal called "MyChart".  Sign up information is provided on this After Visit Summary.  MyChart is used to connect with patients for Virtual Visits (Telemedicine).  Patients are able to view lab/test results, encounter notes, upcoming appointments, etc.  Non-urgent messages can be sent to your provider as well.   To learn more about what you can do with MyChart, go to [email or web address].    Your next appointment:   1 month(s)  The format for your next appointment:   In Person  Provider:   Berniece Salines, DO   Other Instructions Furosemide Oral Tablets What is this medicine? FUROSEMIDE (fyoor OH se mide) is a diuretic. It helps you make more urine and to lose salt and excess water from your body. It treats swelling from heart, kidney, or liver disease. It also treats high blood pressure. This medicine may be used for other  purposes; ask your health care provider or pharmacist if you have questions. COMMON BRAND NAME(S): Active-Medicated Specimen Kit, Delone, Diuscreen, Lasix, RX Specimen Collection Kit, Specimen Collection Kit, URINX Medicated Specimen Collection What should I tell my health care provider before I take this medicine? They need to know if you have any of these conditions:  abnormal blood electrolytes  diarrhea or vomiting  gout  heart disease  kidney disease, small amounts of urine, or difficulty passing urine  liver disease  thyroid disease  an unusual or allergic reaction to furosemide, sulfa drugs, other medicines, foods, dyes, or preservatives  pregnant or trying to get pregnant  breast-feeding How should I use this medicine? Take this drug by mouth. Take it as directed on the prescription label at the same time every day. You can take it with or without food. If it upsets your stomach, take it with food. Keep taking it unless your health care provider tells you to stop. Talk to your health care provider about the use of this drug in children. Special care may be needed. Overdosage: If you think you have taken too much of this medicine  contact a poison control center or emergency room at once. NOTE: This medicine is only for you. Do not share this medicine with others. What if I miss a dose? If you miss a dose, take it as soon as you can. If it is almost time for your next dose, take only that dose. Do not take double or extra doses. What may interact with this medicine?  aspirin and aspirin-like medicines  certain antibiotics  chloral hydrate  cisplatin  cyclosporine  digoxin  diuretics  laxatives  lithium  medicines for blood pressure  medicines that relax muscles for surgery  methotrexate  NSAIDs, medicines for pain and inflammation like ibuprofen, naproxen, or indomethacin  phenytoin  steroid medicines like prednisone or  cortisone  sucralfate  thyroid hormones This list may not describe all possible interactions. Give your health care provider a list of all the medicines, herbs, non-prescription drugs, or dietary supplements you use. Also tell them if you smoke, drink alcohol, or use illegal drugs. Some items may interact with your medicine. What should I watch for while using this medicine? Visit your doctor or health care provider for regular checks on your progress. Check your blood pressure regularly. Ask your doctor or health care provider what your blood pressure should be, and when you should contact him or her. If you are a diabetic, check your blood sugar as directed. This medicine may cause serious skin reactions. They can happen weeks to months after starting the medicine. Contact your health care provider right away if you notice fevers or flu-like symptoms with a rash. The rash may be red or purple and then turn into blisters or peeling of the skin. Or, you might notice a red rash with swelling of the face, lips or lymph nodes in your neck or under your arms. You may need to be on a special diet while taking this medicine. Check with your doctor. Also, ask how many glasses of fluid you need to drink a day. You must not get dehydrated. You may get drowsy or dizzy. Do not drive, use machinery, or do anything that needs mental alertness until you know how this drug affects you. Do not stand or sit up quickly, especially if you are an older patient. This reduces the risk of dizzy or fainting spells. Alcohol can make you more drowsy and dizzy. Avoid alcoholic drinks. This medicine can make you more sensitive to the sun. Keep out of the sun. If you cannot avoid being in the sun, wear protective clothing and use sunscreen. Do not use sun lamps or tanning beds/booths. What side effects may I notice from receiving this medicine? Side effects that you should report to your doctor or health care professional as soon  as possible:  blood in urine or stools  dry mouth  fever or chills  hearing loss or ringing in the ears  irregular heartbeat  muscle pain or weakness, cramps  rash, fever, and swollen lymph nodes  redness, blistering, peeling or loosening of the skin, including inside the mouth  skin rash  stomach upset, pain, or nausea  tingling or numbness in the hands or feet  unusually weak or tired  vomiting or diarrhea  yellowing of the eyes or skin Side effects that usually do not require medical attention (report to your doctor or health care professional if they continue or are bothersome):  headache  loss of appetite  unusual bleeding or bruising This list may not describe all possible side effects. Call your  doctor for medical advice about side effects. You may report side effects to FDA at 1-800-FDA-1088. Where should I keep my medicine? Keep out of the reach of children and pets. Store at room temperature between 20 and 25 degrees C (68 and 77 degrees F). Protect from light and moisture. Keep the container tightly closed. Throw away any unused drug after the expiration date. NOTE: This sheet is a summary. It may not cover all possible information. If you have questions about this medicine, talk to your doctor, pharmacist, or health care provider.  2020 Elsevier/Gold Standard (2019-01-07 18:01:32)  Potassium Chloride Extended-Release Capsules What is this medicine? POTASSIUM CHLORIDE (poe TASS i um KLOOR ide) is a potassium supplement used to prevent and to treat low potassium. Potassium is important for the heart, muscles, and nerves. Too much or too little potassium in the body can cause serious problems. This medicine may be used for other purposes; ask your health care provider or pharmacist if you have questions. COMMON BRAND NAME(S): Klor-Con, Micro-K, Micro-K Extencaps What should I tell my health care provider before I take this medicine? They need to know if you  have any of these conditions:  Addison disease  dehydration  diabetes, high blood sugar  difficulty swallowing  heart disease  high levels of potassium in the blood  irregular heartbeat or rhythm  kidney disease  large areas of burned skin  stomach ulcers, other stomach or intestine problems  an unusual or allergic reaction to potassium, other medicines, foods, dyes, or preservatives  pregnant or trying to get pregnant  breast-feeding How should I use this medicine? Take this drug by mouth with a glass of water. Take it as directed on the prescription label at the same time every day. Take it with food. Do not cut, crush, chew, or suck this drug. Swallow the capsules whole. You may open the capsule and put the contents in a teaspoon of soft food, such as applesauce or pudding. Do not add to hot foods. Swallow the mixture right away. Do not chew the mixture. Drink a glass of water or juice after taking the mixture. Keep taking this medicine unless your health care provider tells you to stop. Talk to your health care provider about the use of this drug in children. Special care may be needed. Overdosage: If you think you have taken too much of this medicine contact a poison control center or emergency room at once. NOTE: This medicine is only for you. Do not share this medicine with others. What if I miss a dose? If you miss a dose, take it as soon as you can. If it is almost time for your next dose, take only that dose. Do not take double or extra doses. What may interact with this medicine? Do not take this medicine with any of the following medications:  certain diuretics such as spironolactone, triamterene  certain medicines for stomach problems like atropine; difenoxin and glycopyrrolate  eplerenone  sodium polystyrene sulfonate This medicine may also interact with the following medications:  certain medicines for blood pressure or heart disease like lisinopril,  losartan, quinapril, valsartan  medicines that lower your chance of fighting infection such as cyclosporine, tacrolimus  NSAIDs, medicines for pain and inflammation, like ibuprofen or naproxen  other potassium supplements  salt substitutes This list may not describe all possible interactions. Give your health care provider a list of all the medicines, herbs, non-prescription drugs, or dietary supplements you use. Also tell them if you smoke,  drink alcohol, or use illegal drugs. Some items may interact with your medicine. What should I watch for while using this medicine? Visit your doctor or health care professional for regular check ups. You will need lab work done regularly. You may need to be on a special diet while taking this medicine. Ask your doctor. What side effects may I notice from receiving this medicine? Side effects that you should report to your doctor or health care professional as soon as possible:  allergic reactions like skin rash, itching or hives, swelling of the face, lips, or tongue  black, tarry stools  breathing problems  confusion  heartburn  fast, irregular heartbeat  feeling faint or lightheaded, falls  low blood pressure  numbness or tingling in hands or feet  pain when swallowing  unusually weak or tired  weakness, heaviness of legs Side effects that usually do not require medical attention (report to your doctor or health care professional if they continue or are bothersome):  diarrhea  nausea, vomiting  stomach pain This list may not describe all possible side effects. Call your doctor for medical advice about side effects. You may report side effects to FDA at 1-800-FDA-1088. Where should I keep my medicine? Keep out of the reach of children. Store at room temperature between 15 and 30 degrees C (59 and 86 degrees F ). Keep bottle closed tightly to protect this medicine from light and moisture. Throw away any unused medicine after the  expiration date. NOTE: This sheet is a summary. It may not cover all possible information. If you have questions about this medicine, talk to your doctor, pharmacist, or health care provider.  2020 Elsevier/Gold Standard (2019-03-18 16:43:28)      Adopting a Healthy Lifestyle.  Know what a healthy weight is for you (roughly BMI <25) and aim to maintain this   Aim for 7+ servings of fruits and vegetables daily   65-80+ fluid ounces of water or unsweet tea for healthy kidneys   Limit to max 1 drink of alcohol per day; avoid smoking/tobacco   Limit animal fats in diet for cholesterol and heart health - choose grass fed whenever available   Avoid highly processed foods, and foods high in saturated/trans fats   Aim for low stress - take time to unwind and care for your mental health   Aim for 150 min of moderate intensity exercise weekly for heart health, and weights twice weekly for bone health   Aim for 7-9 hours of sleep daily   When it comes to diets, agreement about the perfect plan isnt easy to find, even among the experts. Experts at the [employer] developed an idea known as the Healthy Eating Plate. Just imagine a plate divided into logical, healthy portions.   The emphasis is on diet quality:   Load up on vegetables and fruits - one-half of your plate: Aim for color and variety, and remember that potatoes dont count.   Go for whole grains - one-quarter of your plate: Whole wheat, barley, wheat berries, quinoa, oats, brown rice, and foods made with them. If you want pasta, go with whole wheat pasta.   Protein power - one-quarter of your plate: Fish, chicken, beans, and nuts are all healthy, versatile protein sources. Limit red meat.   The diet, however, does go beyond the plate, offering a few other suggestions.   Use healthy plant oils, such as olive, canola, soy, corn, sunflower and peanut. Check the labels, and avoid  partially hydrogenated oil,  which have unhealthy trans fats.   If youre thirsty, drink water. Coffee and tea are good in moderation, but skip sugary drinks and limit milk and dairy products to one or two daily servings.   The type of carbohydrate in the diet is more important than the amount. Some sources of carbohydrates, such as vegetables, fruits, whole grains, and beans-are healthier than others.   Finally, stay active  Signed, Berniece Salines, DO  09/04/2019 1:56 PM    [facility]

## 2019-09-04 NOTE — Patient Instructions (Signed)
Medication Instructions:  Your physician has recommended you make the following change in your medication:   Stop Hydrochlorothiazide Start Lasix 20 mg daily Start KCL 20 meq daily  Please take your blood pressure before taking Lasix. Hold the lasix if your systolic (top number) is 110 or less.   *If you need a refill on your cardiac medications before your next appointment, please call your pharmacy*   Lab Work: Your physician recommends that you have labs done today in the office. We are doing a BMET, Mg and MNP.  If you have labs (blood work) drawn today and your tests are completely normal, you will receive your results only by: . MyChart Message (if you have MyChart) OR . A paper copy in the mail If you have any lab test that is abnormal or we need to change your treatment, we will call you to review the results.   Testing/Procedures: None ordered   Follow-Up: At [facility], you and your health needs are our priority.  As part of our continuing mission to provide you with exceptional heart care, we have created designated Provider Care Teams.  These Care Teams include your primary Cardiologist (physician) and Advanced Practice Providers (APPs -  Physician Assistants and Nurse Practitioners) who all work together to provide you with the care you need, when you need it.  We recommend signing up for the patient portal called "MyChart".  Sign up information is provided on this After Visit Summary.  MyChart is used to connect with patients for Virtual Visits (Telemedicine).  Patients are able to view lab/test results, encounter notes, upcoming appointments, etc.  Non-urgent messages can be sent to your provider as well.   To learn more about what you can do with MyChart, go to [email or web address].    Your next appointment:   1 month(s)  The format for your next appointment:   In Person  Provider:   Kardie Tobb, DO   Other Instructions Furosemide Oral Tablets What  is this medicine? FUROSEMIDE (fyoor [place] se mide) is a diuretic. It helps you make more urine and to lose salt and excess water from your body. It treats swelling from heart, kidney, or liver disease. It also treats high blood pressure. This medicine may be used for other purposes; ask your health care provider or pharmacist if you have questions. COMMON BRAND NAME(S): Active-Medicated Specimen Kit, Delone, Diuscreen, Lasix, RX Specimen Collection Kit, Specimen Collection Kit, URINX Medicated Specimen Collection What should I tell my health care provider before I take this medicine? They need to know if you have any of these conditions:  abnormal blood electrolytes  diarrhea or vomiting  gout  heart disease  kidney disease, small amounts of urine, or difficulty passing urine  liver disease  thyroid disease  an unusual or allergic reaction to furosemide, sulfa drugs, other medicines, foods, dyes, or preservatives  pregnant or trying to get pregnant  breast-feeding How should I use this medicine? Take this drug by mouth. Take it as directed on the prescription label at the same time every day. You can take it with or without food. If it upsets your stomach, take it with food. Keep taking it unless your health care provider tells you to stop. Talk to your health care provider about the use of this drug in children. Special care may be needed. Overdosage: If you think you have taken too much of this medicine contact a poison control center or emergency room at once. NOTE: This medicine is   only for you. Do not share this medicine with others. What if I miss a dose? If you miss a dose, take it as soon as you can. If it is almost time for your next dose, take only that dose. Do not take double or extra doses. What may interact with this medicine?  aspirin and aspirin-like medicines  certain antibiotics  chloral  hydrate  cisplatin  cyclosporine  digoxin  diuretics  laxatives  lithium  medicines for blood pressure  medicines that relax muscles for surgery  methotrexate  NSAIDs, medicines for pain and inflammation like ibuprofen, naproxen, or indomethacin  phenytoin  steroid medicines like prednisone or cortisone  sucralfate  thyroid hormones This list may not describe all possible interactions. Give your health care provider a list of all the medicines, herbs, non-prescription drugs, or dietary supplements you use. Also tell them if you smoke, drink alcohol, or use illegal drugs. Some items may interact with your medicine. What should I watch for while using this medicine? Visit your doctor or health care provider for regular checks on your progress. Check your blood pressure regularly. Ask your doctor or health care provider what your blood pressure should be, and when you should contact him or her. If you are a diabetic, check your blood sugar as directed. This medicine may cause serious skin reactions. They can happen weeks to months after starting the medicine. Contact your health care provider right away if you notice fevers or flu-like symptoms with a rash. The rash may be red or purple and then turn into blisters or peeling of the skin. Or, you might notice a red rash with swelling of the face, lips or lymph nodes in your neck or under your arms. You may need to be on a special diet while taking this medicine. Check with your doctor. Also, ask how many glasses of fluid you need to drink a day. You must not get dehydrated. You may get drowsy or dizzy. Do not drive, use machinery, or do anything that needs mental alertness until you know how this drug affects you. Do not stand or sit up quickly, especially if you are an older patient. This reduces the risk of dizzy or fainting spells. Alcohol can make you more drowsy and dizzy. Avoid alcoholic drinks. This medicine can make you more  sensitive to the sun. Keep out of the sun. If you cannot avoid being in the sun, wear protective clothing and use sunscreen. Do not use sun lamps or tanning beds/booths. What side effects may I notice from receiving this medicine? Side effects that you should report to your doctor or health care professional as soon as possible:  blood in urine or stools  dry mouth  fever or chills  hearing loss or ringing in the ears  irregular heartbeat  muscle pain or weakness, cramps  rash, fever, and swollen lymph nodes  redness, blistering, peeling or loosening of the skin, including inside the mouth  skin rash  stomach upset, pain, or nausea  tingling or numbness in the hands or feet  unusually weak or tired  vomiting or diarrhea  yellowing of the eyes or skin Side effects that usually do not require medical attention (report to your doctor or health care professional if they continue or are bothersome):  headache  loss of appetite  unusual bleeding or bruising This list may not describe all possible side effects. Call your doctor for medical advice about side effects. You may report side effects to FDA  at 1-800-FDA-1088. Where should I keep my medicine? Keep out of the reach of children and pets. Store at room temperature between 20 and 25 degrees C (68 and 77 degrees F). Protect from light and moisture. Keep the container tightly closed. Throw away any unused drug after the expiration date. NOTE: This sheet is a summary. It may not cover all possible information. If you have questions about this medicine, talk to your doctor, pharmacist, or health care provider.  2020 Elsevier/Gold Standard (2019-01-07 18:01:32)  Potassium Chloride Extended-Release Capsules What is this medicine? POTASSIUM CHLORIDE (poe TASS i um KLOOR ide) is a potassium supplement used to prevent and to treat low potassium. Potassium is important for the heart, muscles, and nerves. Too much or too little  potassium in the body can cause serious problems. This medicine may be used for other purposes; ask your health care provider or pharmacist if you have questions. COMMON BRAND NAME(S): Klor-Con, Micro-K, Micro-K Extencaps What should I tell my health care provider before I take this medicine? They need to know if you have any of these conditions:  Addison disease  dehydration  diabetes, high blood sugar  difficulty swallowing  heart disease  high levels of potassium in the blood  irregular heartbeat or rhythm  kidney disease  large areas of burned skin  stomach ulcers, other stomach or intestine problems  an unusual or allergic reaction to potassium, other medicines, foods, dyes, or preservatives  pregnant or trying to get pregnant  breast-feeding How should I use this medicine? Take this drug by mouth with a glass of water. Take it as directed on the prescription label at the same time every day. Take it with food. Do not cut, crush, chew, or suck this drug. Swallow the capsules whole. You may open the capsule and put the contents in a teaspoon of soft food, such as applesauce or pudding. Do not add to hot foods. Swallow the mixture right away. Do not chew the mixture. Drink a glass of water or juice after taking the mixture. Keep taking this medicine unless your health care provider tells you to stop. Talk to your health care provider about the use of this drug in children. Special care may be needed. Overdosage: If you think you have taken too much of this medicine contact a poison control center or emergency room at once. NOTE: This medicine is only for you. Do not share this medicine with others. What if I miss a dose? If you miss a dose, take it as soon as you can. If it is almost time for your next dose, take only that dose. Do not take double or extra doses. What may interact with this medicine? Do not take this medicine with any of the following medications:  certain  diuretics such as spironolactone, triamterene  certain medicines for stomach problems like atropine; difenoxin and glycopyrrolate  eplerenone  sodium polystyrene sulfonate This medicine may also interact with the following medications:  certain medicines for blood pressure or heart disease like lisinopril, losartan, quinapril, valsartan  medicines that lower your chance of fighting infection such as cyclosporine, tacrolimus  NSAIDs, medicines for pain and inflammation, like ibuprofen or naproxen  other potassium supplements  salt substitutes This list may not describe all possible interactions. Give your health care provider a list of all the medicines, herbs, non-prescription drugs, or dietary supplements you use. Also tell them if you smoke, drink alcohol, or use illegal drugs. Some items may interact with your medicine. What   should I watch for while using this medicine? Visit your doctor or health care professional for regular check ups. You will need lab work done regularly. You may need to be on a special diet while taking this medicine. Ask your doctor. What side effects may I notice from receiving this medicine? Side effects that you should report to your doctor or health care professional as soon as possible:  allergic reactions like skin rash, itching or hives, swelling of the face, lips, or tongue  black, tarry stools  breathing problems  confusion  heartburn  fast, irregular heartbeat  feeling faint or lightheaded, falls  low blood pressure  numbness or tingling in hands or feet  pain when swallowing  unusually weak or tired  weakness, heaviness of legs Side effects that usually do not require medical attention (report to your doctor or health care professional if they continue or are bothersome):  diarrhea  nausea, vomiting  stomach pain This list may not describe all possible side effects. Call your doctor for medical advice about side effects. You  may report side effects to FDA at 1-800-FDA-1088. Where should I keep my medicine? Keep out of the reach of children. Store at room temperature between 15 and 30 degrees C (59 and 86 degrees F ). Keep bottle closed tightly to protect this medicine from light and moisture. Throw away any unused medicine after the expiration date. NOTE: This sheet is a summary. It may not cover all possible information. If you have questions about this medicine, talk to your doctor, pharmacist, or health care provider.  2020 Elsevier/Gold Standard (2019-03-18 16:43:28)

## 2019-09-05 LAB — BASIC METABOLIC PANEL
BUN/Creatinine Ratio: 17 (ref 9–23)
BUN: 14 mg/dL (ref 6–24)
CO2: 20 mmol/L (ref 20–29)
Calcium: 9 mg/dL (ref 8.7–10.2)
Chloride: 108 mmol/L — ABNORMAL HIGH (ref 96–106)
Creatinine, Ser: 0.82 mg/dL (ref 0.57–1.00)
GFR calc Af Amer: 96 mL/min/{1.73_m2} (ref >59)
GFR calc non Af Amer: 84 mL/min/{1.73_m2} (ref >59)
Glucose: 93 mg/dL (ref 65–99)
Potassium: 4.2 mmol/L (ref 3.5–5.2)
Sodium: 142 mmol/L (ref 134–144)

## 2019-09-05 LAB — BRAIN NATRIURETIC PEPTIDE: BNP: 23.4 pg/mL (ref 0.0–100.0)

## 2019-09-05 LAB — MAGNESIUM: Magnesium: 2 mg/dL (ref 1.6–2.3)

## 2019-09-08 ENCOUNTER — Telehealth: Payer: Self-pay

## 2019-09-08 NOTE — Telephone Encounter (Signed)
-----   Message from Berniece Salines, DO sent at 09/06/2019  2:37 PM EDT ----- Normal labs.

## 2019-09-08 NOTE — Telephone Encounter (Signed)
Left message on patients voicemail to please return our call.   

## 2019-09-09 ENCOUNTER — Other Ambulatory Visit: Payer: Self-pay | Admitting: Family Medicine

## 2019-09-09 DIAGNOSIS — N760 Acute vaginitis: Secondary | ICD-10-CM | POA: Diagnosis not present

## 2019-09-09 DIAGNOSIS — R102 Pelvic and perineal pain: Secondary | ICD-10-CM | POA: Diagnosis not present

## 2019-09-10 ENCOUNTER — Telehealth: Payer: Self-pay | Admitting: Cardiology

## 2019-09-10 NOTE — Telephone Encounter (Signed)
Left message on patients voicemail to please return our call.   

## 2019-09-10 NOTE — Telephone Encounter (Signed)
Can you call this pt back? You had called them earlier

## 2019-09-10 NOTE — Telephone Encounter (Signed)
New message ° ° ° ° °Returning a call to the nurse °

## 2019-09-10 NOTE — Telephone Encounter (Signed)
Please make an appointment for her to be seen next week, if she does not want to come to [place] please have her see the physician that will be in [place]. Thank you

## 2019-09-10 NOTE — Telephone Encounter (Addendum)
Spoke with pt re mychart message Pt has stopped Acebutolol due to severe migraines since starting med and increase in PVC's not as pronounced but still can feel them  see below message I am a little upset that one day of taking heart meds put me back to day one heart pvcs. I am back to being scared & my heart beating like it's about to bounce out of my chest. It has calmed down just a little, I could still feel it but not as much. Now after taking meds I feel 4 to 5 hard counts a couple soft then back to 4/5 beats.  Do I need to make an appointment. I started to go to ER, but felt nothing would really be done and that I just needed to follow up with you. Is there another medication that doesn't have all of those side effects that can just slightly slow my heart beat down so that hopefully you can get on a better rhythm.   Will forward messge to Dr Harriet Masson for review./cy

## 2019-09-11 NOTE — Telephone Encounter (Signed)
Tried calling patient. No answer and no voicemail set up for me to leave a message.  Letter sent to patient after trying to reach the patient several times with no success.

## 2019-09-16 ENCOUNTER — Ambulatory Visit
Admission: RE | Admit: 2019-09-16 | Discharge: 2019-09-16 | Disposition: A | Discharge status: Home / Self Care | Payer: BC Managed Care – PPO | Source: Ambulatory Visit | Attending: Obstetrics and Gynecology | Admitting: Obstetrics and Gynecology

## 2019-09-16 ENCOUNTER — Other Ambulatory Visit: Payer: Self-pay

## 2019-09-16 DIAGNOSIS — K760 Fatty (change of) liver, not elsewhere classified: Secondary | ICD-10-CM

## 2019-09-16 NOTE — Progress Notes (Signed)
Cardiology Office Note:    Date:  09/17/2019   ID:  Morgan Long, DOB 08/21/1970, MRN [id number]  PCP:  Carollee Herter, Alferd Apa, DO  Cardiologist:  [name] More, MD    Referring MD: Carollee Herter, Alferd Apa, *    ASSESSMENT:    1. PVC (premature ventricular contraction)   2. Bilateral leg edema    PLAN:    In order of problems listed above:  1. For now organ to take a cautious careful approach hold off on antiarrhythmic drug and reassess in the office in 6 months as often this ventricular arrhythmia can be a transient phenomenon I suspect in her case it is a sequelae of her COVID-19 infection.  We will go ahead and check renal function and magnesium today 2. Much improved continue her low-dose diuretic loop and MRI   Next appointment: 6 weeks   Medication Adjustments/Labs and Tests Ordered: Current medicines are reviewed at length with the patient today.  Concerns regarding medicines are outlined above.  Orders Placed This Encounter  Procedures  . Basic Metabolic Panel (BMET)  . Magnesium  . EKG 12-Lead   No orders of the defined types were placed in this encounter.   Chief complaint PVCs and edema  History of Present Illness:    Morgan Long is a 49 y.o. female with a hx of COVID-19 infection, frequent PVCs has been started on acebutolol, and shortness of breath. On her initial appointment due to concerns for frequent PVCs ZIO monitor was placed on the patient which showed 7.8 burden or frequent PVCs. She was encouraged to continue on her acebutolol as prescribed. An echocardiogram was also done which showed LVEF of 60 to 65% with mild mitral regurgitation.  She was last seen by Dr. Harriet Masson 09/04/2019.  She is placed on a low-dose of a loop diuretic and potassium with lower extremity edema.  She has stopped taking beta-blockers she feels that it is worsened her migraines Compliance with diet, lifestyle and medications: Yes  Slowly steadily she is improving.  She has less  palpitation and the episodes are less severe.  Clearly her migraine was worsened with the beta-blocker and were not can prescribe 1.  I reviewed the results of her testing does not appear to have cardiac injury and does not have threatening ventricular arrhythmia.  We reviewed the options including further evaluation like a cardiac MRI and I advised her with her normal echocardiogram I do not think it is necessary prescribing an antiarrhythmic drug like flecainide or simply observing her course over the next 6 weeks as often this is a transient phenomenon post Covid.  She prefers the latter.  Since she is taken a diuretic her legs are better and they are not swollen and blood pressure is at target I will go ahead and check potassium renal function and magnesium and I will plan to see back in the office as mentioned.  Her other complaint is that time she has sharp pain has chest wall tenderness and I have offered her reassurance I did an EKG that shows sinus rhythm normal except for 2 PVCs Past Medical History:  Diagnosis Date  . Allergic rhinitis   . Asthma   . Chronic migraine    Dr. Catalina Gravel  . Constipation   . Dry eye syndrome of both lacrimal glands   . Dry skin   . Endometriosis   . Fatigue   . Gastroparesis   . GERD (gastroesophageal reflux disease)   . Headache(784.0)  occasional, dx w/ Migraines before, Topamax helps  . Heartburn   . History of stomach ulcers   . Lactose intolerance   . Localized edema   . Migraine   . Muscle weakness (generalized)   . Nuclear cataract of both eyes    Mild  . Overweight   . Pain in right ankle and joints of right foot   . Shortness of breath   . Sinus complaint   . Sinusitis   . Tired   . TMJ pain dysfunction syndrome    occasional    Past Surgical History:  Procedure Laterality Date  . ANKLE SURGERY Left 11/16/2017  . AUGMENTATION MAMMAPLASTY Bilateral 2006  . BREAST ENHANCEMENT SURGERY  2006  . BUNIONECTOMY    . COLONOSCOPY    .  endrometroisis    . fallopian tube removed     Left  . UPPER GASTROINTESTINAL ENDOSCOPY    . [place] teeth extraction      Current Medications: Current Meds  Medication Sig  . acebutolol (SECTRAL) 200 MG capsule Take 1 capsule (200 mg total) by mouth daily. For palpitation and PVC's  . albuterol (PROAIR HFA) 108 (90 Base) MCG/ACT inhaler Inhale 2 puffs into the lungs every 6 (six) hours as needed for wheezing or shortness of breath.  Marland Kitchen azelastine (ASTELIN) 0.1 % nasal spray PLACE 2 SPRAYS INTO BOTH NOSTRILS 2 (TWO) TIMES DAILY.  . BD PEN NEEDLE NANO U/F 32G X 4 MM MISC 1 PACKAGE BY DOES NOT APPLY ROUTE 2 (TWO) TIMES DAILY.  . bimatoprost (LATISSE) 0.03 % ophthalmic solution APPLY ALONG UPPER EYELID MARGIN AT BASE OF EYELASHES ONCE A DAY  . cetirizine (ZYRTEC) 10 MG tablet TAKE 1 TABLET BY MOUTH DAILY  . ESTROGEL 0.75 MG/1.25 GM (0.06%) topical gel Apply 1 application topically See admin instructions.  Marland Kitchen etonogestrel-ethinyl estradiol (NUVARING) 0.12-0.015 MG/24HR vaginal ring Place 1 each vaginally every 28 (twenty-eight) days. Insert vaginally and leave in place for 3 consecutive weeks, then remove for 1 week.   . furosemide (LASIX) 20 MG tablet Take 1 tablet (20 mg total) by mouth daily.  Marland Kitchen ibuprofen (ADVIL,MOTRIN) 800 MG tablet Take 800 mg by mouth every 6 (six) hours as needed. for pain  . levocetirizine (XYZAL) 5 MG tablet TAKE 1 TABLET (5 MG TOTAL) BY MOUTH DAILY AS NEEDED FOR ALLERGIES.  Marland Kitchen linaclotide (LINZESS) 72 MCG capsule Take 1 capsule (72 mcg total) by mouth daily before breakfast.  . meloxicam (MOBIC) 15 MG tablet Take by mouth.  . Multiple Vitamin (MULTIVITAMIN) LIQD Take 5 mLs by mouth daily.  . ondansetron (ZOFRAN ODT) 4 MG disintegrating tablet Take 1 tablet (4 mg total) by mouth every 8 (eight) hours as needed for nausea or vomiting.  Marland Kitchen PAZEO 0.7 % SOLN   . phentermine 15 MG capsule Take 1 capsule (15 mg total) by mouth every morning. (Patient taking differently: Take 15  mg by mouth as needed. )  . potassium chloride SA (KLOR-CON) 20 MEQ tablet Take 1 tablet (20 mEq total) by mouth daily.  Marland Kitchen PREVIDENT 5000 SENSITIVE 1.1-5 % PSTE USE BEFORE BED DO NOT RINSE  . promethazine (PHENERGAN) 12.5 MG tablet Take 1 tablet (12.5 mg total) by mouth every 6 (six) hours as needed for nausea or vomiting.  . promethazine-dextromethorphan (PROMETHAZINE-DM) 6.25-15 MG/5ML syrup TAKE 5MLS BY MOUTH 4 TIMES DAILY AS NEEDED  . RESTASIS MULTIDOSE 0.05 % ophthalmic emulsion Place 1 drop into both eyes as needed.  . rizatriptan (MAXALT-MLT) 10 MG disintegrating tablet TAKE  1 TABLET (10 MG TOTAL) BY MOUTH AS NEEDED FOR MIGRAINE. MAY REPEAT IN 2 HOURS IF NEEDED  . SAXENDA 18 MG/3ML SOPN INJECT 3 MG INTO THE SKIN DAILY. (NOT COVERED) CMD  . spironolactone (ALDACTONE) 25 MG tablet Take 25 mg by mouth daily.   Marland Kitchen terconazole (TERAZOL 7) 0.4 % vaginal cream PLACE 1 APPLICATOR VAGINALLY AT BEDTIME.  Marland Kitchen Ubrogepant ([name]) 50 MG TABS Take 50 mg by mouth 2 (two) times daily as needed (1 pill at onset of migraine, may repeat in 2 hours. no more than 4 pills a day).  . Vitamin D, Ergocalciferol, (DRISDOL) 1.25 MG (50000 UT) CAPS capsule Take 1 capsule (50,000 Units total) by mouth every 7 (seven) days.     Allergies:   Hydrocodone, Oxycodone, and Codeine   Social History   Socioeconomic History  . Marital status: Single    Spouse name: Not on file  . Number of children: 0  . Years of education: BS  . Highest education level: Not on file  Occupational History  . Occupation: Paramedic, going to school    Employer: [employer]  . Occupation: Air traffic controller  Tobacco Use  . Smoking status: Never Smoker  . Smokeless tobacco: Never Used  Substance and Sexual Activity  . Alcohol use: Yes    Alcohol/week: 0.0 standard drinks    Comment: socially - occasional   . Drug use: No  . Sexual activity: Yes    Birth control/protection: Inserts    Comment: nuvaring  Other Topics  Concern  . Not on file  Social History Narrative   Household:sister and her 3 kids    Drinks occasional starbucks drink       Social Determinants of Health   Financial Resource Strain:   . Difficulty of Paying Living Expenses:   Food Insecurity:   . Worried About Charity fundraiser in the Last Year:   . Arboriculturist in the Last Year:   Transportation Needs:   . Film/video editor (Medical):   Marland Kitchen Lack of Transportation (Non-Medical):   Physical Activity:   . Days of Exercise per Week:   . Minutes of Exercise per Session:   Stress:   . Feeling of Stress :   Social Connections:   . Frequency of Communication with Friends and Family:   . Frequency of Social Gatherings with Friends and Family:   . Attends Religious Services:   . Active Member of Clubs or Organizations:   . Attends Archivist Meetings:   Marland Kitchen Marital Status:      Family History: The patient's family history includes Alcoholism in her mother; Asthma in an other family member; Breast cancer in her maternal aunt, maternal aunt, maternal aunt, and maternal aunt; Cancer in her father and mother; Cancer - Other (age of onset: 61) in her mother; Colon cancer in her paternal grandfather; Colon cancer (age of onset: 11) in her maternal uncle; Diabetes in an other family member; Heart disease in her father; Hypertension in her maternal aunt; Pancreatic cancer in her maternal aunt; Throat cancer in her maternal uncle and mother. There is no history of Heart attack, Rectal cancer, or Stomach cancer. ROS:   Please see the history of present illness.    All other systems reviewed and are negative.  EKGs/Labs/Other Studies Reviewed:    The following studies were reviewed today:  EKG:  EKG ordered today and personally reviewed.  The ekg ordered today demonstrates sinus rhythm  with 2 PVCs otherwise normal  Recent Labs: 08/06/2019: ALT 19; Hemoglobin 13.7; Platelets 322 08/14/2019: NT-Pro BNP 86 09/04/2019: BNP 23.4;  BUN 14; Creatinine, Ser 0.82; Magnesium 2.0; Potassium 4.2; Sodium 142  Recent Lipid Panel    Component Value Date/Time   CHOL 191 05/06/2018 1421   TRIG 93 05/21/2019 2145   HDL 65 05/06/2018 1421   CHOLHDL 3 05/14/2014 1223   VLDL 12.2 05/14/2014 1223   LDLCALC 104 (H) 05/06/2018 1421    Physical Exam:    VS:  BP 112/70 (BP Location: Right Arm, Patient Position: Sitting, Cuff Size: Normal)   Pulse 95   Temp 99.1 F (37.3 C)   Ht 5\' 3"  (1.6 m)   Wt 226 lb 1.6 oz (102.6 kg)   SpO2 98%   BMI 40.05 kg/m     Wt Readings from Last 3 Encounters:  09/17/19 226 lb 1.6 oz (102.6 kg)  09/04/19 225 lb (102.1 kg)  08/14/19 221 lb (100.2 kg)     GEN: She does not nearly look as sick as last visit well nourished, well developed in no acute distress HEENT: Normal NECK: No JVD; No carotid bruits LYMPHATICS: No lymphadenopathy CARDIAC: RRR, no murmurs, rubs, gallops RESPIRATORY:  Clear to auscultation without rales, wheezing or rhonchi  ABDOMEN: Soft, non-tender, non-distended MUSCULOSKELETAL:  No edema; No deformity  SKIN: Warm and dry NEUROLOGIC:  Alert and oriented x 3 PSYCHIATRIC:  Normal affect    Signed, [name] More, MD  09/17/2019 3:43 PM     [facility]

## 2019-09-17 ENCOUNTER — Ambulatory Visit: Payer: BC Managed Care – PPO | Admitting: Cardiology

## 2019-09-17 ENCOUNTER — Other Ambulatory Visit: Payer: Self-pay

## 2019-09-17 ENCOUNTER — Encounter: Payer: Self-pay | Admitting: Cardiology

## 2019-09-17 ENCOUNTER — Other Ambulatory Visit: Payer: Self-pay | Admitting: Cardiology

## 2019-09-17 VITALS — BP 112/70 | HR 95 | Temp 99.1°F | Ht 63.0 in | Wt 226.1 lb

## 2019-09-17 DIAGNOSIS — R600 Localized edema: Secondary | ICD-10-CM | POA: Diagnosis not present

## 2019-09-17 DIAGNOSIS — I493 Ventricular premature depolarization: Secondary | ICD-10-CM

## 2019-09-17 DIAGNOSIS — I10 Essential (primary) hypertension: Secondary | ICD-10-CM | POA: Diagnosis not present

## 2019-09-17 NOTE — Telephone Encounter (Signed)
Spoke with patient  She states she has never no shown to an appointment, according to her last 3 appts with TP they were completed. I gave her the number to the billing office and told her to check with them.  Patient voiced understanding Nothing further needed at this time.

## 2019-09-17 NOTE — Patient Instructions (Signed)
Medication Instructions:  Your physician recommends that you continue on your current medications as directed. Please refer to the Current Medication list given to you today.  *If you need a refill on your cardiac medications before your next appointment, please call your pharmacy*   Lab Work: Your physician recommends that you return for lab work in: TODAY BMP, Magnesium If you have labs (blood work) drawn today and your tests are completely normal, you will receive your results only by: Marland Kitchen MyChart Message (if you have MyChart) OR . A paper copy in the mail If you have any lab test that is abnormal or we need to change your treatment, we will call you to review the results.   Testing/Procedures: None   Follow-Up: At [facility], you and your health needs are our priority.  As part of our continuing mission to provide you with exceptional heart care, we have created designated Provider Care Teams.  These Care Teams include your primary Cardiologist (physician) and Advanced Practice Providers (APPs -  Physician Assistants and Nurse Practitioners) who all work together to provide you with the care you need, when you need it.  We recommend signing up for the patient portal called "MyChart".  Sign up information is provided on this After Visit Summary.  MyChart is used to connect with patients for Virtual Visits (Telemedicine).  Patients are able to view lab/test results, encounter notes, upcoming appointments, etc.  Non-urgent messages can be sent to your provider as well.   To learn more about what you can do with MyChart, go to [email or web address].    Your next appointment:   4 week(s)  The format for your next appointment:   In Person  Provider:   [name] More, MD   Other Instructions Please purchase [employer] for your I-phone. This will help you to record your heart beat daily.

## 2019-09-18 ENCOUNTER — Encounter: Payer: Self-pay | Admitting: Family Medicine

## 2019-09-18 ENCOUNTER — Telehealth: Payer: Self-pay

## 2019-09-18 ENCOUNTER — Other Ambulatory Visit: Payer: Self-pay | Admitting: Family Medicine

## 2019-09-18 DIAGNOSIS — E559 Vitamin D deficiency, unspecified: Secondary | ICD-10-CM

## 2019-09-18 LAB — BASIC METABOLIC PANEL
BUN/Creatinine Ratio: 18 (ref 9–23)
BUN: 14 mg/dL (ref 6–24)
CO2: 21 mmol/L (ref 20–29)
Calcium: 9.1 mg/dL (ref 8.7–10.2)
Chloride: 103 mmol/L (ref 96–106)
Creatinine, Ser: 0.79 mg/dL (ref 0.57–1.00)
GFR calc Af Amer: 101 mL/min/{1.73_m2} (ref >59)
GFR calc non Af Amer: 88 mL/min/{1.73_m2} (ref >59)
Glucose: 79 mg/dL (ref 65–99)
Potassium: 4.5 mmol/L (ref 3.5–5.2)
Sodium: 137 mmol/L (ref 134–144)

## 2019-09-18 LAB — MAGNESIUM: Magnesium: 2 mg/dL (ref 1.6–2.3)

## 2019-09-18 MED ORDER — VITAMIN D (ERGOCALCIFEROL) 1.25 MG (50000 UNIT) PO CAPS: 50000.0000 [IU] | ORAL_CAPSULE | ORAL | 1 refills | Status: DC

## 2019-09-18 NOTE — Telephone Encounter (Signed)
Last refill for Vit D was in 01/2019 by Dr. Leafy Ro. Please advise

## 2019-09-18 NOTE — Telephone Encounter (Signed)
-----   Message from Richardo Priest, MD sent at 09/18/2019  7:52 AM EDT ----- Normal or stable result  No changes

## 2019-09-18 NOTE — Telephone Encounter (Signed)
done

## 2019-09-18 NOTE — Telephone Encounter (Signed)
Spoke with patient regarding results.  Patient verbalizes understanding and is agreeable to plan of care. Advised patient to call back with any issues or concerns.  

## 2019-09-20 ENCOUNTER — Other Ambulatory Visit: Payer: Self-pay | Admitting: Cardiology

## 2019-09-20 DIAGNOSIS — I493 Ventricular premature depolarization: Secondary | ICD-10-CM

## 2019-09-22 ENCOUNTER — Encounter: Payer: Self-pay | Admitting: Family Medicine

## 2019-09-22 NOTE — Telephone Encounter (Signed)
Please advise 

## 2019-09-22 NOTE — Telephone Encounter (Signed)
She should be ok----  If she has any problems when she gets back she should let us know

## 2019-09-25 ENCOUNTER — Encounter: Payer: Self-pay | Admitting: Family Medicine

## 2019-09-25 DIAGNOSIS — L219 Seborrheic dermatitis, unspecified: Secondary | ICD-10-CM | POA: Diagnosis not present

## 2019-09-25 DIAGNOSIS — D2230 Melanocytic nevi of unspecified part of face: Secondary | ICD-10-CM | POA: Diagnosis not present

## 2019-09-25 DIAGNOSIS — L249 Irritant contact dermatitis, unspecified cause: Secondary | ICD-10-CM | POA: Diagnosis not present

## 2019-09-25 NOTE — Telephone Encounter (Signed)
Note done

## 2019-09-26 NOTE — Telephone Encounter (Signed)
Patient called this morning regarding the revision that is needed for this letter.  Patient is in need of this note being updated with a RTW date of September 29, 2019.  Can this please be updated and uploaded to patient's Mychart ASAP?  Thank you!

## 2019-10-02 ENCOUNTER — Other Ambulatory Visit: Payer: Self-pay

## 2019-10-02 ENCOUNTER — Ambulatory Visit
Admission: RE | Admit: 2019-10-02 | Discharge: 2019-10-02 | Disposition: A | Discharge status: Home / Self Care | Payer: BC Managed Care – PPO | Source: Ambulatory Visit | Attending: Obstetrics and Gynecology | Admitting: Obstetrics and Gynecology

## 2019-10-02 DIAGNOSIS — K760 Fatty (change of) liver, not elsewhere classified: Secondary | ICD-10-CM

## 2019-10-02 DIAGNOSIS — D1803 Hemangioma of intra-abdominal structures: Secondary | ICD-10-CM | POA: Diagnosis not present

## 2019-10-02 MED ORDER — GADOBENATE DIMEGLUMINE 529 MG/ML IV SOLN
20.0000 mL | Freq: Once | INTRAVENOUS | Status: AC | PRN
  Administered 2019-10-02: 20 mL via INTRAVENOUS

## 2019-10-03 ENCOUNTER — Ambulatory Visit: Payer: BC Managed Care – PPO | Admitting: Cardiology

## 2019-10-03 ENCOUNTER — Encounter: Payer: Self-pay | Admitting: Family Medicine

## 2019-10-04 ENCOUNTER — Inpatient Hospital Stay (HOSPITAL_COMMUNITY): Admission: RE | Admit: 2019-10-04 | Payer: BC Managed Care – PPO | Source: Ambulatory Visit

## 2019-10-06 ENCOUNTER — Telehealth: Payer: Self-pay | Admitting: Adult Health

## 2019-10-06 ENCOUNTER — Other Ambulatory Visit (HOSPITAL_COMMUNITY)
Admission: RE | Admit: 2019-10-06 | Discharge: 2019-10-06 | Disposition: A | Discharge status: Home / Self Care | Payer: BC Managed Care – PPO | Source: Ambulatory Visit | Attending: Adult Health | Admitting: Adult Health

## 2019-10-06 DIAGNOSIS — Z01812 Encounter for preprocedural laboratory examination: Secondary | ICD-10-CM | POA: Insufficient documentation

## 2019-10-06 DIAGNOSIS — Z20822 Contact with and (suspected) exposure to covid-19: Secondary | ICD-10-CM | POA: Insufficient documentation

## 2019-10-06 LAB — SARS CORONAVIRUS 2 (TAT 6-24 HRS): SARS Coronavirus 2: NEGATIVE

## 2019-10-06 NOTE — Telephone Encounter (Signed)
Yes tell her to go ASAP today , if results not back will have to reschedule on Wednesday .

## 2019-10-06 NOTE — Telephone Encounter (Signed)
Called spoke with patient to discuss.  Per office protocol, covid testing needs to be done no sooner than 3 days prior to PFT testing.  Attempted to explain to patient then need to reschedule the PFT and covid testing.  Patient became angry stating that she has been waiting to have this PFT for 3 months and that "it's not her fault that the covid testing site was closed on Saturday."  Per Epic, the testing site was NOT closed and saw patients from 0900-1230.  Patient was scheduled for 1100.  Tammy, patient is adamant about being tested today stating that her results will likely be back in time.  Patient is aware if her results are not back in time for her PFT, this test will still have to be rescheduled.  Tammy, are you comfortable with scheduling the covid for today?  Or would you like to reschedule all of the appts?  Right now the 1st available is June 9.  Thank you.

## 2019-10-06 NOTE — Telephone Encounter (Signed)
Ok I called pt and scheduled her for covid test at 1:55pm. Nothing further is needed.

## 2019-10-07 ENCOUNTER — Encounter: Payer: Self-pay | Admitting: Family Medicine

## 2019-10-08 ENCOUNTER — Ambulatory Visit (INDEPENDENT_AMBULATORY_CARE_PROVIDER_SITE_OTHER): Payer: BC Managed Care – PPO | Admitting: Pulmonary Disease

## 2019-10-08 ENCOUNTER — Ambulatory Visit: Payer: BC Managed Care – PPO | Admitting: Adult Health

## 2019-10-08 ENCOUNTER — Encounter: Payer: Self-pay | Admitting: Adult Health

## 2019-10-08 ENCOUNTER — Other Ambulatory Visit: Payer: Self-pay

## 2019-10-08 ENCOUNTER — Encounter: Payer: Self-pay | Admitting: Family Medicine

## 2019-10-08 DIAGNOSIS — J4540 Moderate persistent asthma, uncomplicated: Secondary | ICD-10-CM | POA: Diagnosis not present

## 2019-10-08 DIAGNOSIS — R0602 Shortness of breath: Secondary | ICD-10-CM

## 2019-10-08 LAB — PULMONARY FUNCTION TEST
DL/VA % pred: 120 %
DL/VA: 5.23 ml/min/mmHg/L
DLCO cor % pred: 97 %
DLCO cor: 20.24 ml/min/mmHg
DLCO unc % pred: 97 %
DLCO unc: 20.24 ml/min/mmHg
FEF 25-75 Post: 3.13 L/sec
FEF 25-75 Pre: 2.64 L/sec
FEF2575-%Change-Post: 18 %
FEF2575-%Pred-Post: 129 %
FEF2575-%Pred-Pre: 109 %
FEV1-%Change-Post: 6 %
FEV1-%Pred-Post: 109 %
FEV1-%Pred-Pre: 102 %
FEV1-Post: 2.49 L
FEV1-Pre: 2.33 L
FEV1FVC-%Change-Post: 4 %
FEV1FVC-%Pred-Pre: 106 %
FEV6-%Change-Post: 2 %
FEV6-%Pred-Post: 99 %
FEV6-%Pred-Pre: 97 %
FEV6-Post: 2.75 L
FEV6-Pre: 2.69 L
FEV6FVC-%Pred-Post: 102 %
FEV6FVC-%Pred-Pre: 102 %
FVC-%Change-Post: 2 %
FVC-%Pred-Post: 96 %
FVC-%Pred-Pre: 94 %
FVC-Post: 2.75 L
FVC-Pre: 2.69 L
Post FEV1/FVC ratio: 91 %
Post FEV6/FVC ratio: 100 %
Pre FEV1/FVC ratio: 87 %
Pre FEV6/FVC Ratio: 100 %
RV % pred: 88 %
RV: 1.56 L
TLC % pred: 89 %
TLC: 4.47 L

## 2019-10-08 NOTE — Assessment & Plan Note (Signed)
Healthy weight loss discussed 

## 2019-10-08 NOTE — Assessment & Plan Note (Signed)
Dyspnea suspect is multifactorial with possible component of deconditioning. Patient has had an extensive work-up that has been unrevealing.  She has been followed by cardiology with normal 2D echo.  CT chest prior to COVID-19 was normal not showing any signs of fibrosis or sarcoidosis.  She did have Covid pneumonia in December.  She has had multiple scans over the last 6 months including 2 CTs of the chest, CT abdomen, CT head, MRI, and several chest x-rays. Most recent chest x-ray showed clear lungs.  We will hold off on additional CT.  If symptoms persist could consider a high-resolution CT chest to make sure she did not develop post Covid fibrosis.  Although highly unlikely as PFTs are normal with a normal DLCO and no restriction.   Plan  Patient Instructions  Delsym As needed  Cough .  Advance activity as tolerated .  Albuterol inhaler As needed  Wheezing.  Follow up with Dr. Elsworth Soho  Or Abhay Godbolt NP in 4-6 months and As needed

## 2019-10-08 NOTE — Assessment & Plan Note (Signed)
Mild intermittent asthma.  No significant perceived benefit with inhaler.  May use albuterol as needed.  PFT showed normal lung function. May remain off of [place]  Patient Instructions  Delsym As needed  Cough .  Advance activity as tolerated .  Albuterol inhaler As needed  Wheezing.  Follow up with Dr. Elsworth Soho  Or Jaydence Vanyo NP in 4-6 months and As needed

## 2019-10-08 NOTE — Patient Instructions (Addendum)
Delsym As needed  Cough .  Advance activity as tolerated .  Albuterol inhaler As needed  Wheezing.  Follow up with Dr. Elsworth Soho  Or Kaesyn Johnston NP in 4-6 months and As needed

## 2019-10-08 NOTE — Progress Notes (Signed)
@Patient  ID: Morgan Long, female    DOB: 1971/04/27, 49 y.o.   MRN: [id number]  Chief Complaint  Patient presents with  . Follow-up    Dyspnea     Referring provider: [name], *  HPI: 49 year old female seen for pulmonary consult March 06, 2019 for asthma and shortness of breath along with an abnormal chest x-ray Developed COVID-19 December [id number] complicated by Covid pneumonia.  TEST/EVENTS :  CT chest November 2020 showed no acute process and clear lungs.  CT chest May 21, 2019 was negative for PE.,  Multifocal areas of nodular airspace opacity with surrounding groundglass consistent with COVID-19 pneumonia  10/08/2019 Follow up : Dyspnea , PFT results  Patient returns for a 62-month follow-up.  Patient has had ongoing shortness of breath and intermittent cough that has progressively worsened since developing COVID-19 in December 2020.  She has had a slow recovery with persistent symptoms of shortness of breath intermittent chest pains and tightness along with cough.  She has been seen by cardiology.  A 2D echo showed normal EF.  Normal right ventricular systolic function and pulmonary artery pressures.  D-dimer was negative.  Patient had a 3-day Zio patch and reports no acute findings.  Last visit patient was having increased cough and congestion.  She had received a 10-day course of antibiotics.  Patient says this did help with her cough and congestion and cough is nearly resolved.  She had been previously started on breztri says she took this for a while but is no longer taking.  She was set up for pulmonary function testing that was done today that showed normal lung function with no airflow obstruction or restriction.  And a normal DLCO.  She says she still gets short of breath with minimum activity.  Has decreased exercise tolerance.  She says over the last 3 years she has gained about 65 pounds.  She had an ankle injury that prevented her from being active and  exercising.  She has now recovered from her ankle injury.\  Chest x-ray March 2021 showed clear lungs..   Allergies  Allergen Reactions  . Hydrocodone Nausea Only  . Oxycodone Itching  . Codeine Itching and Nausea Only    Immunization History  Administered Date(s) Administered  . Influenza Split 03/28/2011  . Influenza Whole 05/14/2007  . Influenza,inj,Quad PF,6+ Mos 04/24/2013, 02/27/2014, 02/27/2017, 03/12/2018, 01/23/2019  . PFIZER SARS-COV-2 Vaccination 09/15/2019, 10/06/2019  . Pneumococcal Polysaccharide-23 01/23/2019  . Td 07/08/2007  . Tdap 03/12/2018    Past Medical History:  Diagnosis Date  . Allergic rhinitis   . Asthma   . Chronic migraine    Dr. Catalina Gravel  . Constipation   . Dry eye syndrome of both lacrimal glands   . Dry skin   . Endometriosis   . Fatigue   . Gastroparesis   . GERD (gastroesophageal reflux disease)   . Headache(784.0)    occasional, dx w/ Migraines before, Topamax helps  . Heartburn   . History of stomach ulcers   . Lactose intolerance   . Localized edema   . Migraine   . Muscle weakness (generalized)   . Nuclear cataract of both eyes    Mild  . Overweight   . Pain in right ankle and joints of right foot   . Shortness of breath   . Sinus complaint   . Sinusitis   . Tired   . TMJ pain dysfunction syndrome    occasional    Tobacco History:  Social History   Tobacco Use  Smoking Status Never Smoker  Smokeless Tobacco Never Used   Counseling given: Not Answered   Outpatient Medications Prior to Visit  Medication Sig Dispense Refill  . acebutolol (SECTRAL) 200 MG capsule TAKE 1 CAPSULE (200 MG TOTAL) BY MOUTH DAILY. FOR PALPITATION AND PVC'S 90 capsule 0  . albuterol (PROAIR HFA) 108 (90 Base) MCG/ACT inhaler Inhale 2 puffs into the lungs every 6 (six) hours as needed for wheezing or shortness of breath. 18 g 2  . azelastine (ASTELIN) 0.1 % nasal spray PLACE 2 SPRAYS INTO BOTH NOSTRILS 2 (TWO) TIMES DAILY. 90 mL 0  . BD PEN  NEEDLE NANO U/F 32G X 4 MM MISC 1 PACKAGE BY DOES NOT APPLY ROUTE 2 (TWO) TIMES DAILY. 100 each 0  . benzonatate (TESSALON) 200 MG capsule Take 1 capsule (200 mg total) by mouth 2 (two) times daily as needed for cough. 20 capsule 0  . bimatoprost (LATISSE) 0.03 % ophthalmic solution APPLY ALONG UPPER EYELID MARGIN AT BASE OF EYELASHES ONCE A DAY    . cetirizine (ZYRTEC) 10 MG tablet TAKE 1 TABLET BY MOUTH DAILY 30 tablet 5  . ESTROGEL 0.75 MG/1.25 GM (0.06%) topical gel Apply 1 application topically See admin instructions.  0  . etonogestrel-ethinyl estradiol (NUVARING) 0.12-0.015 MG/24HR vaginal ring Place 1 each vaginally every 28 (twenty-eight) days. Insert vaginally and leave in place for 3 consecutive weeks, then remove for 1 week.     . furosemide (LASIX) 20 MG tablet Take 1 tablet (20 mg total) by mouth daily. 30 tablet 6  . ibuprofen (ADVIL,MOTRIN) 800 MG tablet Take 800 mg by mouth every 6 (six) hours as needed. for pain  3  . levocetirizine (XYZAL) 5 MG tablet TAKE 1 TABLET (5 MG TOTAL) BY MOUTH DAILY AS NEEDED FOR ALLERGIES. 90 tablet 1  . linaclotide (LINZESS) 72 MCG capsule Take 1 capsule (72 mcg total) by mouth daily before breakfast. 30 capsule 6  . meloxicam (MOBIC) 15 MG tablet Take by mouth.    . Multiple Vitamin (MULTIVITAMIN) LIQD Take 5 mLs by mouth daily.    . ondansetron (ZOFRAN ODT) 4 MG disintegrating tablet Take 1 tablet (4 mg total) by mouth every 8 (eight) hours as needed for nausea or vomiting. 6 tablet 0  . PAZEO 0.7 % SOLN     . phentermine 15 MG capsule Take 1 capsule (15 mg total) by mouth every morning. (Patient taking differently: Take 15 mg by mouth as needed. ) 30 capsule 0  . potassium chloride SA (KLOR-CON) 20 MEQ tablet Take 1 tablet (20 mEq total) by mouth daily. 30 tablet 6  . PREVIDENT 5000 SENSITIVE 1.1-5 % PSTE USE BEFORE BED DO NOT RINSE    . promethazine (PHENERGAN) 12.5 MG tablet Take 1 tablet (12.5 mg total) by mouth every 6 (six) hours as needed for  nausea or vomiting. 30 tablet 1  . promethazine-dextromethorphan (PROMETHAZINE-DM) 6.25-15 MG/5ML syrup TAKE 5MLS BY MOUTH 4 TIMES DAILY AS NEEDED 118 mL 0  . RESTASIS MULTIDOSE 0.05 % ophthalmic emulsion Place 1 drop into both eyes as needed.  3  . rizatriptan (MAXALT-MLT) 10 MG disintegrating tablet TAKE 1 TABLET (10 MG TOTAL) BY MOUTH AS NEEDED FOR MIGRAINE. MAY REPEAT IN 2 HOURS IF NEEDED 9 tablet 11  . SAXENDA 18 MG/3ML SOPN INJECT 3 MG INTO THE SKIN DAILY. (NOT COVERED) CMD 15 mL 0  . spironolactone (ALDACTONE) 25 MG tablet Take 25 mg by mouth daily.     Marland Kitchen  terconazole (TERAZOL 7) 0.4 % vaginal cream PLACE 1 APPLICATOR VAGINALLY AT BEDTIME. 45 g 0  . Ubrogepant ([name]) 50 MG TABS Take 50 mg by mouth 2 (two) times daily as needed (1 pill at onset of migraine, may repeat in 2 hours. no more than 4 pills a day). 10 tablet 3  . Vitamin D, Ergocalciferol, (DRISDOL) 1.25 MG (50000 UNIT) CAPS capsule Take 1 capsule (50,000 Units total) by mouth every 7 (seven) days. 12 capsule 1   No facility-administered medications prior to visit.     Review of Systems:   Constitutional:   No  weight loss, night sweats,  Fevers, chills +, fatigue, or  lassitude.  HEENT:   No headaches,  Difficulty swallowing,  Tooth/dental problems, or  Sore throat,                No sneezing, itching, ear ache, nasal congestion, post nasal drip,   CV:  No chest pain,  Orthopnea, PND, swelling in lower extremities, anasarca, dizziness, palpitations, syncope.   GI  No heartburn, indigestion, abdominal pain, nausea, vomiting, diarrhea, change in bowel habits, loss of appetite, bloody stools.   Resp:.  No chest wall deformity  Skin: no rash or lesions.  GU: no dysuria, change in color of urine, no urgency or frequency.  No flank pain, no hematuria   MS:  No joint pain or swelling.  No decreased range of motion.  No back pain.    Physical Exam  BP 118/74 (BP Location: Left Arm, Cuff Size: Normal)   Pulse 92   Ht  5' 3.5" (1.613 m)   Wt 231 lb (104.8 kg)   SpO2 97%   BMI 40.28 kg/m   GEN: A/Ox3; pleasant , NAD, BMI 40    HEENT:  [place]/AT,  EACs-clear, TMs-wnl, NOSE-clear, THROAT-clear, no lesions, no postnasal drip or exudate noted.   NECK:  Supple w/ fair ROM; no JVD; normal carotid impulses w/o bruits; no thyromegaly or nodules palpated; no lymphadenopathy.    RESP  Clear  P & A; w/o, wheezes/ rales/ or rhonchi. no accessory muscle use, no dullness to percussion  CARD:  RRR, no m/r/g, no peripheral edema, pulses intact, no cyanosis or clubbing.  GI:   Soft & nt; nml bowel sounds; no organomegaly or masses detected.   Musco: Warm bil, no deformities or joint swelling noted.   Neuro: alert, no focal deficits noted.    Skin: Warm, no lesions or rashes    Lab Results:  CBC    Component Value Date/Time   WBC 11.3 (H) 08/06/2019 1906   RBC 4.72 08/06/2019 1906   HGB 13.7 08/06/2019 1906   HGB 12.2 01/23/2018 1155   HCT 42.1 08/06/2019 1906   HCT 37.0 01/23/2018 1155   PLT 322 08/06/2019 1906   MCV 89.2 08/06/2019 1906   MCV 88 01/23/2018 1155   MCH 29.0 08/06/2019 1906   MCHC 32.5 08/06/2019 1906   RDW 14.6 08/06/2019 1906   RDW 15.4 01/23/2018 1155   LYMPHSABS 2.6 08/06/2019 1906   LYMPHSABS 1.9 01/23/2018 1155   MONOABS 0.8 08/06/2019 1906   EOSABS 0.1 08/06/2019 1906   EOSABS 0.1 01/23/2018 1155   BASOSABS 0.1 08/06/2019 1906   BASOSABS 0.0 01/23/2018 1155    BMET    Component Value Date/Time   NA 137 09/17/2019 1557   K 4.5 09/17/2019 1557   CL 103 09/17/2019 1557   CO2 21 09/17/2019 1557   GLUCOSE 79 09/17/2019 1557   GLUCOSE 87  08/06/2019 1906   BUN 14 09/17/2019 1557   CREATININE 0.79 09/17/2019 1557   CALCIUM 9.1 09/17/2019 1557   GFRNONAA 88 09/17/2019 1557   GFRAA 101 09/17/2019 1557    BNP    Component Value Date/Time   BNP 23.4 09/04/2019 1403   BNP 38.5 05/21/2019 2101    ProBNP    Component Value Date/Time   PROBNP 86 08/14/2019 1146     Imaging: MR ABDOMEN W WO CONTRAST  Result Date: 10/03/2019 CLINICAL DATA:  Abnormality seen on previous abdominal and pelvic CT in the liver, deemed indeterminate. Follow-up evaluation. EXAM: MRI ABDOMEN WITHOUT AND WITH CONTRAST TECHNIQUE: Multiplanar multisequence MR imaging of the abdomen was performed both before and after the administration of intravenous contrast. CONTRAST:  98mL MULTIHANCE GADOBENATE DIMEGLUMINE 529 MG/ML IV SOLN COMPARISON:  Abdominal CT of 08/06/2019 FINDINGS: Lower chest: Incidental imaging of the lung bases is unremarkable. Limited assessment on MRI. Incidental note made of bilateral breast implants. Heptobiliary: Liver is heterogeneous with lobular contour. Small areas seen in lateral segment LEFT hepatic lobe on T2 weighted imaging (image 10, series 30). Also seen on image 26 of series 12 at this stage showing potential peripheral, nodular and discontinuous enhancement. Not visible on more delayed images. Vague area of abnormality on T2 in the RIGHT hepatic lobe in the area of the hypervascular abnormality seen on the previous CT (image 19, series 5. Very subtle diffusion related signal changes in these areas. Potential very subtle washout. Hypervascular enhancement as before. This is best illustrated on image 54 of series 11 where the abnormality measures approximately 16 x 13 mm along the hepatic margin. More pronounced fatty changes along the gallbladder fossa, the area of concern in the gallbladder fossa a loses signal on out of phase as compared to in phase T1 weighted gradient echo imaging and measures approximately 14 mm. 4 mm area in the central RIGHT hepatic lobe that does not show evidence of enhancement or washout Background mild hepatic steatosis. Biliary tree is nondilated. Gallbladder is normal. Pancreas:  Pancreas is normal. No ductal dilation or inflammation. Spleen:  Spleen is normal size without focal lesion. Adrenals/Urinary Tract: Adrenal glands are normal.  Smooth renal contours. Subcentimeter LEFT renal cyst in the interpolar LEFT kidney. Symmetric renal enhancement. Stomach/Bowel: Limited assessment of the gastrointestinal tract is unremarkable. Vascular/Lymphatic: Vascular structures in the abdomen are patent. Portal vein is widely patent. Hepatic veins are patent. No evidence of venous collateral formation. Other:  No ascites. Musculoskeletal: No acute bone finding or destructive bone process. IMPRESSION: 1. Mildly lobular hepatic contours with suggestion of early fissural widening in the setting of mild hepatic steatosis. Correlate with any clinical or laboratory evidence of liver disease or risk factors for development of cirrhosis. No secondary signs of portal hypertension on today's scan. 2. LEFT lateral segment hemangioma. 3. In the absence of known liver disease the lesion along the RIGHT hepatic margin is likely an atypical FNH or adenoma. Would suggest follow-up in 3 months with MRI utilizing Eovist contrast to assess for presence of function in hepatic size. 4. More focal area of fatty changes along the gallbladder fossa, the area of concern seen on the previous CT. Potentially small adenoma versus focal fat intensification, attention to this area on follow-up. 5. Small benign lesion in the more central RIGHT hepatic lobe. Electronically Signed   By: Zetta Bills M.D.   On: 10/03/2019 09:12      PFT Results Latest Ref Rng & Units 10/08/2019  FVC-Pre L  2.69  FVC-Predicted Pre % 94  FVC-Post L 2.75  FVC-Predicted Post % 96  Pre FEV1/FVC % % 87  Post FEV1/FCV % % 91  FEV1-Pre L 2.33  FEV1-Predicted Pre % 102  FEV1-Post L 2.49  DLCO UNC% % 97  DLCO COR %Predicted % 120  TLC L 4.47  TLC % Predicted % 89  RV % Predicted % 88    No results found for: NITRICOXIDE      Assessment & Plan:   No problem-specific Assessment & Plan notes found for this encounter.     Rexene Edison, NP 10/08/2019

## 2019-10-08 NOTE — Progress Notes (Signed)
Full PFT performed today. °

## 2019-10-11 ENCOUNTER — Other Ambulatory Visit: Payer: Self-pay | Admitting: Family Medicine

## 2019-10-11 DIAGNOSIS — R059 Cough, unspecified: Secondary | ICD-10-CM

## 2019-10-11 DIAGNOSIS — R05 Cough: Secondary | ICD-10-CM

## 2019-10-16 DIAGNOSIS — N3010 Interstitial cystitis (chronic) without hematuria: Secondary | ICD-10-CM | POA: Diagnosis not present

## 2019-10-20 DIAGNOSIS — R351 Nocturia: Secondary | ICD-10-CM | POA: Diagnosis not present

## 2019-10-20 DIAGNOSIS — N3941 Urge incontinence: Secondary | ICD-10-CM | POA: Diagnosis not present

## 2019-10-20 DIAGNOSIS — R3915 Urgency of urination: Secondary | ICD-10-CM | POA: Diagnosis not present

## 2019-10-20 DIAGNOSIS — R102 Pelvic and perineal pain: Secondary | ICD-10-CM | POA: Diagnosis not present

## 2019-10-21 ENCOUNTER — Ambulatory Visit: Payer: BC Managed Care – PPO | Admitting: Cardiology

## 2019-10-21 DIAGNOSIS — R932 Abnormal findings on diagnostic imaging of liver and biliary tract: Secondary | ICD-10-CM | POA: Diagnosis not present

## 2019-10-22 ENCOUNTER — Other Ambulatory Visit: Payer: Self-pay | Admitting: Internal Medicine

## 2019-10-22 DIAGNOSIS — K760 Fatty (change of) liver, not elsewhere classified: Secondary | ICD-10-CM

## 2019-10-31 ENCOUNTER — Encounter: Payer: Self-pay | Admitting: *Deleted

## 2019-11-04 ENCOUNTER — Telehealth: Payer: Self-pay | Admitting: *Deleted

## 2019-11-04 NOTE — Telephone Encounter (Signed)
-----   Message from Jerene Bears, MD sent at 11/04/2019 12:08 PM EDT ----- Would think they could take over if that is what she prefers, but she may think they are handling "liver". Should we ask her? ----- Message ----- From: Larina Bras, CMA Sent: 10/31/2019  11:25 AM EDT To: Jerene Bears, MD  Dr Hilarie Fredrickson,  Patient is scheduled to see you in follow up for gastroparesis on 11/19/19. HOWEVER, she has recently seen [facility] for fatty liver/abnormal liver imaging and also has a follow up scheduled with them on 11/21/19.... Thoughts?

## 2019-11-04 NOTE — Telephone Encounter (Signed)
Patient indicates that [facility] was handling her "liver" and has only 1 appointment left. She would like to return to Dr Vena Rua care thereafter.  She has changed her appointment date to 02/02/20 at 1:50 pm.

## 2019-11-07 ENCOUNTER — Ambulatory Visit
Admission: RE | Admit: 2019-11-07 | Discharge: 2019-11-07 | Disposition: A | Discharge status: Home / Self Care | Payer: BC Managed Care – PPO | Source: Ambulatory Visit | Attending: Internal Medicine | Admitting: Internal Medicine

## 2019-11-07 DIAGNOSIS — K7689 Other specified diseases of liver: Secondary | ICD-10-CM | POA: Diagnosis not present

## 2019-11-07 DIAGNOSIS — K760 Fatty (change of) liver, not elsewhere classified: Secondary | ICD-10-CM

## 2019-11-11 DIAGNOSIS — R351 Nocturia: Secondary | ICD-10-CM | POA: Diagnosis not present

## 2019-11-11 DIAGNOSIS — N393 Stress incontinence (female) (male): Secondary | ICD-10-CM | POA: Diagnosis not present

## 2019-11-11 DIAGNOSIS — R3915 Urgency of urination: Secondary | ICD-10-CM | POA: Diagnosis not present

## 2019-11-11 DIAGNOSIS — R102 Pelvic and perineal pain: Secondary | ICD-10-CM | POA: Diagnosis not present

## 2019-11-14 ENCOUNTER — Encounter: Payer: Self-pay | Admitting: Family Medicine

## 2019-11-14 ENCOUNTER — Other Ambulatory Visit: Payer: Self-pay | Admitting: Family Medicine

## 2019-11-14 DIAGNOSIS — H04123 Dry eye syndrome of bilateral lacrimal glands: Secondary | ICD-10-CM

## 2019-11-14 MED ORDER — CYCLOSPORINE 0.05 % OP EMUL: 1.0000 [drp] | OPHTHALMIC | 3 refills | Status: DC | PRN

## 2019-11-19 ENCOUNTER — Ambulatory Visit: Payer: BC Managed Care – PPO | Admitting: Internal Medicine

## 2019-11-21 DIAGNOSIS — R932 Abnormal findings on diagnostic imaging of liver and biliary tract: Secondary | ICD-10-CM | POA: Diagnosis not present

## 2019-11-25 DIAGNOSIS — M6281 Muscle weakness (generalized): Secondary | ICD-10-CM | POA: Diagnosis not present

## 2019-11-25 DIAGNOSIS — R102 Pelvic and perineal pain: Secondary | ICD-10-CM | POA: Diagnosis not present

## 2019-11-25 DIAGNOSIS — R351 Nocturia: Secondary | ICD-10-CM | POA: Diagnosis not present

## 2019-11-25 DIAGNOSIS — N393 Stress incontinence (female) (male): Secondary | ICD-10-CM | POA: Diagnosis not present

## 2019-12-02 DIAGNOSIS — M6281 Muscle weakness (generalized): Secondary | ICD-10-CM | POA: Diagnosis not present

## 2019-12-02 DIAGNOSIS — R102 Pelvic and perineal pain: Secondary | ICD-10-CM | POA: Diagnosis not present

## 2019-12-02 DIAGNOSIS — N393 Stress incontinence (female) (male): Secondary | ICD-10-CM | POA: Diagnosis not present

## 2019-12-02 DIAGNOSIS — R351 Nocturia: Secondary | ICD-10-CM | POA: Diagnosis not present

## 2019-12-03 ENCOUNTER — Encounter: Payer: Self-pay | Admitting: Neurology

## 2019-12-03 ENCOUNTER — Other Ambulatory Visit: Payer: Self-pay | Admitting: Family Medicine

## 2019-12-12 DIAGNOSIS — R945 Abnormal results of liver function studies: Secondary | ICD-10-CM | POA: Diagnosis not present

## 2019-12-12 DIAGNOSIS — K7689 Other specified diseases of liver: Secondary | ICD-10-CM | POA: Diagnosis not present

## 2019-12-12 DIAGNOSIS — R932 Abnormal findings on diagnostic imaging of liver and biliary tract: Secondary | ICD-10-CM | POA: Diagnosis not present

## 2019-12-13 NOTE — Progress Notes (Deleted)
Cardiology Office Note:    Date:  12/13/2019   ID:  Morgan Long, DOB [birth year], MRN [id number]  PCP:  Carollee Herter, Alferd Apa, DO  Cardiologist:  [name] More, MD    Referring MD: Carollee Herter, Alferd Apa, *    ASSESSMENT:    No diagnosis found. PLAN:    In order of problems listed above:  1. ***   Next appointment: ***   Medication Adjustments/Labs and Tests Ordered: Current medicines are reviewed at length with the patient today.  Concerns regarding medicines are outlined above.  No orders of the defined types were placed in this encounter.  No orders of the defined types were placed in this encounter.   No chief complaint on file.   History of Present Illness:    Morgan Long is a 49 y.o. female with a hx of palpitation with frequent PVCs.  She was last seen 09/17/2019.  She has a history of COVID-19 infection and echocardiogram showed no findings of cardiomyopathy or cardiac injury.  Other problems include chronic lower extremity edema.  Her PVC burden was 7.8% with isolated PVCs and no episodes of ventricular tachycardia.  She has been seen in follow-up by pulmonary with normal PFTs and diffusion capacity.  She had COVID-19 with hospitalization December 2020.  She has been seen by gastroenterology and had recent liver biopsy percutaneously. Compliance with diet, lifestyle and medications: *** Past Medical History:  Diagnosis Date  . Allergic rhinitis   . Asthma   . Chronic migraine    Dr. Catalina Gravel  . Constipation   . Dry eye syndrome of both lacrimal glands   . Dry skin   . Endometriosis   . Fatigue   . Gastroparesis   . GERD (gastroesophageal reflux disease)   . Headache(784.0)    occasional, dx w/ Migraines before, Topamax helps  . Heartburn   . Hepatic steatosis   . History of stomach ulcers   . Lactose intolerance   . Localized edema   . Migraine   . Muscle weakness (generalized)   . Nuclear cataract of both eyes    Mild  . Overweight   . Pain  in right ankle and joints of right foot   . Shortness of breath   . Sinus complaint   . Sinusitis   . Tired   . TMJ pain dysfunction syndrome    occasional    Past Surgical History:  Procedure Laterality Date  . ANKLE SURGERY Left 11/16/2017  . AUGMENTATION MAMMAPLASTY Bilateral 2006  . BREAST ENHANCEMENT SURGERY  2006  . BUNIONECTOMY    . COLONOSCOPY    . endrometroisis    . fallopian tube removed     Left  . UPPER GASTROINTESTINAL ENDOSCOPY    . wisdoim teeth extraction      Current Medications: No outpatient medications have been marked as taking for the 12/15/19 encounter (Appointment) with Richardo Priest, MD.     Allergies:   Hydrocodone, Oxycodone, and Codeine   Social History   Socioeconomic History  . Marital status: Single    Spouse name: Not on file  . Number of children: 0  . Years of education: BS  . Highest education level: Not on file  Occupational History  . Occupation: Paramedic, going to school    Employer: [employer]  . Occupation: Air traffic controller  Tobacco Use  . Smoking status: Never Smoker  . Smokeless tobacco: Never Used  Vaping Use  . Vaping Use:  Never used  Substance and Sexual Activity  . Alcohol use: Yes    Alcohol/week: 0.0 standard drinks    Comment: socially - occasional   . Drug use: No  . Sexual activity: Yes    Birth control/protection: Inserts    Comment: nuvaring  Other Topics Concern  . Not on file  Social History Narrative   Household:sister and her 3 kids    Drinks occasional starbucks drink       Social Determinants of Health   Financial Resource Strain:   . Difficulty of Paying Living Expenses:   Food Insecurity:   . Worried About Charity fundraiser in the Last Year:   . Arboriculturist in the Last Year:   Transportation Needs:   . Film/video editor (Medical):   Marland Kitchen Lack of Transportation (Non-Medical):   Physical Activity:   . Days of Exercise per Week:   . Minutes of Exercise per  Session:   Stress:   . Feeling of Stress :   Social Connections:   . Frequency of Communication with Friends and Family:   . Frequency of Social Gatherings with Friends and Family:   . Attends Religious Services:   . Active Member of Clubs or Organizations:   . Attends Archivist Meetings:   Marland Kitchen Marital Status:      Family History: The patient's ***family history includes Alcoholism in her mother; Asthma in an other family member; Breast cancer in her maternal aunt, maternal aunt, maternal aunt, and maternal aunt; Cancer in her father and mother; Cancer - Other (age of onset: 52) in her mother; Colon cancer in her paternal grandfather; Colon cancer (age of onset: 47) in her maternal uncle; Diabetes in an other family member; Heart disease in her father; Hypertension in her maternal aunt; Pancreatic cancer in her maternal aunt; Throat cancer in her maternal uncle and mother. There is no history of Heart attack, Rectal cancer, or Stomach cancer. ROS:   Please see the history of present illness.    All other systems reviewed and are negative.  EKGs/Labs/Other Studies Reviewed:    The following studies were reviewed today:  EKG:  EKG ordered today and personally reviewed.  The ekg ordered today demonstrates ***  Recent Labs: 08/06/2019: ALT 19; Hemoglobin 13.7; Platelets 322 08/14/2019: NT-Pro BNP 86 09/04/2019: BNP 23.4 09/17/2019: BUN 14; Creatinine, Ser 0.79; Magnesium 2.0; Potassium 4.5; Sodium 137  Recent Lipid Panel    Component Value Date/Time   CHOL 191 05/06/2018 1421   TRIG 93 05/21/2019 2145   HDL 65 05/06/2018 1421   CHOLHDL 3 05/14/2014 1223   VLDL 12.2 05/14/2014 1223   LDLCALC 104 (H) 05/06/2018 1421    Physical Exam:    VS:  There were no vitals taken for this visit.    Wt Readings from Last 3 Encounters:  10/08/19 231 lb (104.8 kg)  09/17/19 226 lb 1.6 oz (102.6 kg)  09/04/19 225 lb (102.1 kg)     GEN: *** Well nourished, well developed in no acute  distress HEENT: Normal NECK: No JVD; No carotid bruits LYMPHATICS: No lymphadenopathy CARDIAC: ***RRR, no murmurs, rubs, gallops RESPIRATORY:  Clear to auscultation without rales, wheezing or rhonchi  ABDOMEN: Soft, non-tender, non-distended MUSCULOSKELETAL:  No edema; No deformity  SKIN: Warm and dry NEUROLOGIC:  Alert and oriented x 3 PSYCHIATRIC:  Normal affect    Signed, [name] More, MD  12/13/2019 8:07 PM    [place]

## 2019-12-14 ENCOUNTER — Encounter: Payer: Self-pay | Admitting: Family Medicine

## 2019-12-15 ENCOUNTER — Emergency Department (HOSPITAL_BASED_OUTPATIENT_CLINIC_OR_DEPARTMENT_OTHER): Payer: BC Managed Care – PPO

## 2019-12-15 ENCOUNTER — Other Ambulatory Visit: Payer: Self-pay | Admitting: Family Medicine

## 2019-12-15 ENCOUNTER — Emergency Department (HOSPITAL_BASED_OUTPATIENT_CLINIC_OR_DEPARTMENT_OTHER)
Admission: EM | Admit: 2019-12-15 | Discharge: 2019-12-15 | Disposition: A | Discharge status: Home / Self Care | Payer: BC Managed Care – PPO | Attending: Emergency Medicine | Admitting: Emergency Medicine

## 2019-12-15 ENCOUNTER — Encounter: Payer: Self-pay | Admitting: Family Medicine

## 2019-12-15 ENCOUNTER — Other Ambulatory Visit: Payer: Self-pay

## 2019-12-15 ENCOUNTER — Telehealth: Payer: Self-pay | Admitting: Neurology

## 2019-12-15 ENCOUNTER — Encounter (HOSPITAL_BASED_OUTPATIENT_CLINIC_OR_DEPARTMENT_OTHER): Payer: Self-pay | Admitting: Emergency Medicine

## 2019-12-15 ENCOUNTER — Ambulatory Visit: Payer: BC Managed Care – PPO | Admitting: Cardiology

## 2019-12-15 DIAGNOSIS — R1013 Epigastric pain: Secondary | ICD-10-CM | POA: Diagnosis not present

## 2019-12-15 DIAGNOSIS — J45901 Unspecified asthma with (acute) exacerbation: Secondary | ICD-10-CM | POA: Diagnosis not present

## 2019-12-15 DIAGNOSIS — R109 Unspecified abdominal pain: Secondary | ICD-10-CM | POA: Diagnosis not present

## 2019-12-15 DIAGNOSIS — K828 Other specified diseases of gallbladder: Secondary | ICD-10-CM | POA: Diagnosis not present

## 2019-12-15 DIAGNOSIS — Z79899 Other long term (current) drug therapy: Secondary | ICD-10-CM | POA: Insufficient documentation

## 2019-12-15 LAB — CBC WITH DIFFERENTIAL/PLATELET
Abs Immature Granulocytes: 0.04 10*3/uL (ref 0.00–0.07)
Basophils Absolute: 0.1 10*3/uL (ref 0.0–0.1)
Basophils Relative: 1 %
Eosinophils Absolute: 0.2 10*3/uL (ref 0.0–0.5)
Eosinophils Relative: 2 %
HCT: 38.8 % (ref 36.0–46.0)
Hemoglobin: 12.5 g/dL (ref 12.0–15.0)
Immature Granulocytes: 0 %
Lymphocytes Relative: 18 %
Lymphs Abs: 2.2 10*3/uL (ref 0.7–4.0)
MCH: 28.7 pg (ref 26.0–34.0)
MCHC: 32.2 g/dL (ref 30.0–36.0)
MCV: 89 fL (ref 80.0–100.0)
Monocytes Absolute: 1 10*3/uL (ref 0.1–1.0)
Monocytes Relative: 8 %
Neutro Abs: 8.5 10*3/uL — ABNORMAL HIGH (ref 1.7–7.7)
Neutrophils Relative %: 71 %
Platelets: 360 10*3/uL (ref 150–400)
RBC: 4.36 MIL/uL (ref 3.87–5.11)
RDW: 13.1 % (ref 11.5–15.5)
WBC: 12 10*3/uL — ABNORMAL HIGH (ref 4.0–10.5)
nRBC: 0 % (ref 0.0–0.2)

## 2019-12-15 LAB — COMPREHENSIVE METABOLIC PANEL
ALT: 20 U/L (ref 0–44)
AST: 18 U/L (ref 15–41)
Albumin: 3.4 g/dL — ABNORMAL LOW (ref 3.5–5.0)
Alkaline Phosphatase: 71 U/L (ref 38–126)
Anion gap: 9 (ref 5–15)
BUN: 16 mg/dL (ref 6–20)
CO2: 25 mmol/L (ref 22–32)
Calcium: 8.7 mg/dL — ABNORMAL LOW (ref 8.9–10.3)
Chloride: 106 mmol/L (ref 98–111)
Creatinine, Ser: 0.88 mg/dL (ref 0.44–1.00)
GFR calc Af Amer: >60 mL/min (ref >60)
GFR calc non Af Amer: >60 mL/min (ref >60)
Glucose, Bld: 103 mg/dL — ABNORMAL HIGH (ref 70–99)
Potassium: 4.7 mmol/L (ref 3.5–5.1)
Sodium: 140 mmol/L (ref 135–145)
Total Bilirubin: 0.3 mg/dL (ref 0.3–1.2)
Total Protein: 7.1 g/dL (ref 6.5–8.1)

## 2019-12-15 LAB — PROTIME-INR
INR: 1 (ref 0.8–1.2)
Prothrombin Time: 12.7 seconds (ref 11.4–15.2)

## 2019-12-15 LAB — PREGNANCY, URINE: Preg Test, Ur: NEGATIVE

## 2019-12-15 LAB — LIPASE, BLOOD: Lipase: 24 U/L (ref 11–51)

## 2019-12-15 MED ORDER — ONDANSETRON 4 MG PO TBDP
ORAL_TABLET | ORAL | 0 refills | Status: DC
Start: 2019-12-15 — End: 2020-09-10

## 2019-12-15 MED ORDER — SODIUM CHLORIDE 0.9 % IV BOLUS
1000.0000 mL | Freq: Once | INTRAVENOUS | Status: AC
  Administered 2019-12-15: 1000 mL via INTRAVENOUS

## 2019-12-15 MED ORDER — ONDANSETRON HCL 4 MG/2ML IJ SOLN
4.0000 mg | Freq: Once | INTRAMUSCULAR | Status: AC
  Administered 2019-12-15: 4 mg via INTRAVENOUS
  Filled 2019-12-15: qty 2

## 2019-12-15 MED ORDER — MORPHINE SULFATE 15 MG PO TABS: 7.5000 mg | ORAL_TABLET | ORAL | 0 refills | Status: DC | PRN

## 2019-12-15 MED ORDER — GABAPENTIN 100 MG PO CAPS
100.0000 mg | ORAL_CAPSULE | Freq: Three times a day (TID) | ORAL | 3 refills | Status: DC
Start: 2019-12-15 — End: 2019-12-29

## 2019-12-15 MED ORDER — MORPHINE SULFATE (PF) 4 MG/ML IV SOLN
4.0000 mg | Freq: Once | INTRAVENOUS | Status: AC
  Administered 2019-12-15: 4 mg via INTRAVENOUS
  Filled 2019-12-15: qty 1

## 2019-12-15 MED ORDER — ONDANSETRON 4 MG PO TBDP
4.0000 mg | ORAL_TABLET | Freq: Once | ORAL | Status: AC
  Administered 2019-12-15: 4 mg via ORAL
  Filled 2019-12-15: qty 1

## 2019-12-15 MED ORDER — IOHEXOL 300 MG/ML  SOLN
100.0000 mL | Freq: Once | INTRAMUSCULAR | Status: AC | PRN
  Administered 2019-12-15: 100 mL via INTRAVENOUS

## 2019-12-15 MED FILL — ONDANSETRON ODT 4MG TBDP: 4 | 3 days supply | Qty: 20 | Fill #0

## 2019-12-15 MED FILL — MORPHINE SULFATE IR 15 MG T: 15 | 1 days supply | Qty: 3 | Fill #0

## 2019-12-15 NOTE — ED Notes (Signed)
ED Provider at bedside discussing test results and plan of care. 

## 2019-12-15 NOTE — Telephone Encounter (Signed)
Please advise 

## 2019-12-15 NOTE — Telephone Encounter (Signed)
Has she called the dr that did the bx--- they are normally the ones to give meds

## 2019-12-15 NOTE — Telephone Encounter (Signed)
Oxycodone is codeine  I can send in gabapentin but she should f/u with the surgeon that did the bx

## 2019-12-15 NOTE — Telephone Encounter (Signed)
Pt still having pain and wants to know if there is an option for regular hydrocodone without the codeine or Gabapentin? Pt having shoulder pain but was told this normal after a bx? Pt concerned about damage to her liver due to the pain and the incision site looks normal. Please advise

## 2019-12-15 NOTE — ED Provider Notes (Signed)
[facility] EMERGENCY DEPARTMENT Provider Note   CSN: [id number] Arrival date & time: 12/15/19  4166     History Chief Complaint  Patient presents with  . Abdominal Pain    Morgan Long is a 49 y.o. female.  49 yo F with a chief complaints of epigastric abdominal pain.  This is described as sharp and severe.  Going on since she had a liver biopsy about 3 days ago.  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  She had a biopsy performed without significant issue but has had progressive worsening pain over the past 3 days.  Denies any significant bruising.  Has not looked at her wound.  Nausea without vomiting.  Nothing seems to make it better or worse.  Has tried ibuprofen at home with minimal improvement.  Denies fevers.  Has not had a bowel movement since her procedure.  Has been able to eat and drink.  The history is provided by the patient.  Abdominal Pain Pain location:  Epigastric and RUQ Pain quality: sharp   Pain radiates to:  Does not radiate Pain severity:  Moderate Onset quality:  Gradual Duration:  3 days Timing:  Constant Progression:  Worsening Chronicity:  New Context: previous surgery (liver biopsy)   Relieved by:  Nothing Worsened by:  Nothing Ineffective treatments:  None tried Associated symptoms: no chest pain, no chills, no dysuria, no fever, no nausea, no shortness of breath and no vomiting        Past Medical History:  Diagnosis Date  . Allergic rhinitis   . Asthma   . Chronic migraine    Dr. Catalina Gravel  . Constipation   . Dry eye syndrome of both lacrimal glands   . Dry skin   . Endometriosis   . Fatigue   . Gastroparesis   . GERD (gastroesophageal reflux disease)   . Headache(784.0)    occasional, dx w/ Migraines before, Topamax helps  . Heartburn   . Hepatic steatosis   . History of stomach ulcers   . Lactose intolerance   .  Localized edema   . Migraine   . Muscle weakness (generalized)   . Nuclear cataract of both eyes    Mild  . Overweight   . Pain in right ankle and joints of right foot   . Shortness of breath   . Sinus complaint   . Sinusitis   . Tired   . TMJ pain dysfunction syndrome    occasional    Patient Active Problem List   Diagnosis Date Noted  . Anxiety 05/12/2019  . Abnormality of lung on CXR 03/06/2019  . Moderate persistent asthma 01/23/2019  . Lower extremity edema 01/23/2019  . Capsulitis of ankle, left 09/24/2018  . Synovitis of left ankle 09/24/2018  . Ankle injury, initial encounter 05/23/2018  . Acute pain of left shoulder 03/12/2018  . S/P repair of ligament of ankle 03/05/2018  . Chronic pain of right ankle 10/28/2017  . Other fatigue 08/14/2017  . Dyspnea 08/14/2017  . Other insomnia 08/14/2017  . Medication management 06/12/2017  . Nonintractable headache 06/12/2017  . Gastroparesis 06/12/2017  . Severe obesity (BMI >= 40) ([place]) 04/25/2017  . Leukocytosis 03/01/2017  . Obesity (BMI 35.0-39.9 without comorbidity) 03/01/2017  . Chronic migraine without aura without status migrainosus, not intractable 03/01/2017  . Sinusitis 07/31/2016  . Influenza-like illness 07/12/2016  . Peroneal tendinitis of left lower extremity  11/03/2015  . Pes anserine bursitis 11/03/2015  . Asthma with acute exacerbation 06/15/2015  . Acute sinusitis 06/15/2015  . PCP NOTES >>>> 02/23/2015  . GERD (gastroesophageal reflux disease) 01/13/2015  . Myalgia and myositis 07/22/2014  . 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 Paresthesias 06/02/2014  . Acute pharyngitis 05/20/2014  . Strep pharyngitis 05/20/2014  . Degenerative cervical disc 10/28/2013  . Bursitis, shoulder 09/17/2013  . Muscle spasm of back 09/11/2013  . Nonallopathic lesion of thoracic region 09/11/2013  . General medical examination 03/28/2011  . Chronic rhinitis   . TMJ PAIN 12/16/2007  . Headache(784.0) 12/16/2007     Past Surgical History:  Procedure Laterality Date  . ANKLE SURGERY Left 11/16/2017  . AUGMENTATION MAMMAPLASTY Bilateral 2006  . BREAST ENHANCEMENT SURGERY  2006  . BUNIONECTOMY    . COLONOSCOPY    . endrometroisis    . fallopian tube removed     Left  . UPPER GASTROINTESTINAL ENDOSCOPY    . wisdoim teeth extraction       OB History    Gravida  0   Para  0   Term  0   Preterm  0   AB  0   Living  0     SAB  0   TAB  0   Ectopic  0   Multiple  0   Live Births  0           Family History  Problem Relation Age of Onset  . Throat cancer Mother   . Cancer - Other Mother 55       throat - died 54 months  . Cancer Mother   . Alcoholism Mother   . Heart disease Father   . Cancer Father   . Breast cancer Maternal Aunt        breast  . Colon cancer Maternal Uncle 4       died 81  . Breast cancer Maternal Aunt        breast  . Pancreatic cancer Maternal Aunt   . Diabetes Other        grandmother  . Hypertension Maternal Aunt        several family members  . Throat cancer Maternal Uncle   . Asthma Other        cousin, maternal  . Breast cancer Maternal Aunt        total of 5 aunts  . Breast cancer Maternal Aunt   . Colon cancer Paternal Grandfather   . Heart attack Neg Hx   . Rectal cancer Neg Hx   . Stomach cancer Neg Hx     Social History   Tobacco Use  . Smoking status: Never Smoker  . Smokeless tobacco: Never Used  Vaping Use  . Vaping Use: Never used  Substance Use Topics  . Alcohol use: Yes    Alcohol/week: 0.0 standard drinks    Comment: socially - occasional   . Drug use: No    Home Medications Prior to Admission medications   Medication Sig Start Date End Date Taking? Authorizing Provider  acebutolol (SECTRAL) 200 MG capsule TAKE 1 CAPSULE (200 MG TOTAL) BY MOUTH DAILY. FOR PALPITATION AND PVC'S 09/23/19   Richardo Priest, MD  albuterol [facility] HFA) 108 (90 Base) MCG/ACT inhaler Inhale 2 puffs into the lungs every 6 (six)  hours as needed for wheezing or shortness of breath. 01/23/19   Carollee Herter, Alferd Apa, DO  azelastine (ASTELIN) 0.1 % nasal spray PLACE 2  SPRAYS INTO BOTH NOSTRILS 2 (TWO) TIMES DAILY. 07/08/19   Bobbitt, [place] Muta, MD  BD PEN NEEDLE NANO U/F 32G X 4 MM MISC 1 PACKAGE BY DOES NOT APPLY ROUTE 2 (TWO) TIMES DAILY. 12/05/18   Dennard Nip D, MD  benzonatate (TESSALON) 200 MG capsule Take 1 capsule (200 mg total) by mouth 2 (two) times daily as needed for cough. 08/19/19   Carollee Herter, Yvonne R, DO  bimatoprost (LATISSE) 0.03 % ophthalmic solution APPLY ALONG UPPER EYELID MARGIN AT BASE OF EYELASHES ONCE A DAY 12/03/18   [provider]  cetirizine (ZYRTEC) 10 MG tablet TAKE 1 TABLET BY MOUTH DAILY 05/10/18   [name], DO  cycloSPORINE (RESTASIS MULTIDOSE) 0.05 % ophthalmic emulsion Place 1 drop into both eyes as needed. 11/14/19   Roma Schanz R, DO  ESTROGEL 0.75 MG/1.25 GM (0.06%) topical gel Apply 1 application topically See admin instructions. 05/18/15   [provider]  etonogestrel-ethinyl estradiol (NUVARING) 0.12-0.015 MG/24HR vaginal ring Place 1 each vaginally every 28 (twenty-eight) days. Insert vaginally and leave in place for 3 consecutive weeks, then remove for 1 week.     [provider]  furosemide (LASIX) 20 MG tablet Take 1 tablet (20 mg total) by mouth daily. 09/04/19 12/03/19  Tobb, Kardie, DO  ibuprofen (ADVIL,MOTRIN) 800 MG tablet Take 800 mg by mouth every 6 (six) hours as needed. for pain 11/27/16   [provider]  levocetirizine (XYZAL) 5 MG tablet TAKE 1 TABLET (5 MG TOTAL) BY MOUTH DAILY AS NEEDED FOR ALLERGIES. 06/30/19   Bobbitt, Sed[place] Muta, MD  linaclotide [facility]) 72 MCG capsule Take 1 capsule (72 mcg total) by mouth daily before breakfast. 04/11/19   Pyrtle, Lajuan Lines, MD  meloxicam (MOBIC) 15 MG tablet Take by mouth. 01/02/19   [provider]  morphine (MSIR) 15 MG tablet Take 0.5 tablets (7.5 mg total) by mouth every  4 (four) hours as needed for severe pain. 12/15/19   Deno Etienne, DO  Multiple Vitamin (MULTIVITAMIN) LIQD Take 5 mLs by mouth daily.    [provider]  ondansetron (ZOFRAN ODT) 4 MG disintegrating tablet 4mg  ODT q4 hours prn nausea/vomit 12/15/19   Deno Etienne, DO  PAZEO 0.7 % SOLN  01/23/19   [provider]  phentermine 15 MG capsule Take 1 capsule (15 mg total) by mouth every morning. Patient taking differently: Take 15 mg by mouth as needed.  01/14/19   Dennard Nip D, MD  potassium chloride SA (KLOR-CON) 20 MEQ tablet Take 1 tablet (20 mEq total) by mouth daily. 09/04/19   Tobb, Kardie, DO  PREVIDENT 5000 SENSITIVE 1.1-5 % PSTE USE BEFORE BED DO NOT RINSE 11/21/18   [provider]  promethazine (PHENERGAN) 12.5 MG tablet Take 1 tablet (12.5 mg total) by mouth every 6 (six) hours as needed for nausea or vomiting. 05/12/19   Roma Schanz R, DO  promethazine-dextromethorphan (PROMETHAZINE-DM) 6.25-15 MG/5ML syrup TAKE 5MLS BY MOUTH 4 TIMES DAILY AS NEEDED 10/13/19   Carollee Herter, Alferd Apa, DO  rizatriptan (MAXALT-MLT) 10 MG disintegrating tablet TAKE 1 TABLET (10 MG TOTAL) BY MOUTH AS NEEDED FOR MIGRAINE. MAY REPEAT IN 2 HOURS IF NEEDED 05/06/18   Star Age, MD  SAXENDA 18 MG/3ML SOPN INJECT 3 MG INTO THE SKIN DAILY. (NOT COVERED) CMD 12/04/18   Dennard Nip D, MD  spironolactone (ALDACTONE) 25 MG tablet Take 25 mg by mouth daily.  02/11/19   [provider]  terconazole (TERAZOL 7) 0.4 %  vaginal cream PLACE 1 APPLICATOR VAGINALLY AT BEDTIME. 09/09/19   Carollee Herter, Alferd Apa, DO  Ubrogepant ([name]) 50 MG TABS Take 50 mg by mouth 2 (two) times daily as needed (1 pill at onset of migraine, may repeat in 2 hours. no more than 4 pills a day). 05/08/19   Star Age, MD  Vitamin D, Ergocalciferol, (DRISDOL) 1.25 MG (50000 UNIT) CAPS capsule Take 1 capsule (50,000 Units total) by mouth every 7 (seven) days. 09/18/19   [name], DO    Allergies     Hydrocodone, Oxycodone, and Codeine  Review of Systems   Review of Systems  Constitutional: Negative for chills and fever.  HENT: Negative for congestion and rhinorrhea.   Eyes: Negative for redness and visual disturbance.  Respiratory: Negative for shortness of breath and wheezing.   Cardiovascular: Negative for chest pain and palpitations.  Gastrointestinal: Positive for abdominal pain. Negative for nausea and vomiting.  Genitourinary: Negative for dysuria and urgency.  Musculoskeletal: Negative for arthralgias and myalgias.  Skin: Negative for pallor and wound.  Neurological: Negative for dizziness and headaches.    Physical Exam Updated Vital Signs BP 124/77   Pulse 80   Temp 99 F (37.2 C)   Resp 18   Ht 5\' 3"  (1.6 m)   Wt 104.3 kg   LMP 12/01/2019   SpO2 98%   BMI 40.74 kg/m   Physical Exam Vitals and nursing note reviewed.  Constitutional:      General: She is not in acute distress.    Appearance: She is well-developed. She is not diaphoretic.  HENT:     Head: Normocephalic and atraumatic.  Eyes:     Pupils: Pupils are equal, round, and reactive to light.  Cardiovascular:     Rate and Rhythm: Normal rate and regular rhythm.     Heart sounds: No murmur heard.  No friction rub. No gallop.   Pulmonary:     Effort: Pulmonary effort is normal.     Breath sounds: No wheezing or rales.  Abdominal:     General: There is no distension.     Palpations: Abdomen is soft.     Tenderness: There is abdominal tenderness in the right upper quadrant and epigastric area.     Comments: Diffuse abdominal tenderness but worse in the epigastrium and right upper quadrant.  No significant erythema or warmth to the area of the wound.  Musculoskeletal:        General: No tenderness.     Cervical back: Normal range of motion and neck supple.  Skin:    General: Skin is warm and dry.  Neurological:     Mental Status: She is alert and oriented to person, place, and time.   Psychiatric:        Behavior: Behavior normal.     ED Results / Procedures / Treatments   Labs (all labs ordered are listed, but only abnormal results are displayed) Labs Reviewed  CBC WITH DIFFERENTIAL/PLATELET - Abnormal; Notable for the following components:      Result Value   WBC 12.0 (*)    Neutro Abs 8.5 (*)    All other components within normal limits  COMPREHENSIVE METABOLIC PANEL - Abnormal; Notable for the following components:   Glucose, Bld 103 (*)    Calcium 8.7 (*)    Albumin 3.4 (*)    All other components within normal limits  LIPASE, BLOOD  PROTIME-INR  PREGNANCY, URINE    EKG None  Radiology CT  ABDOMEN PELVIS W CONTRAST  Result Date: 12/15/2019 CLINICAL DATA:  Abdominal pain and tenderness. Liver biopsy 2 days ago. EXAM: CT ABDOMEN AND PELVIS WITH CONTRAST TECHNIQUE: Multidetector CT imaging of the abdomen and pelvis was performed using the standard protocol following bolus administration of intravenous contrast. CONTRAST:  119mL OMNIPAQUE IOHEXOL 300 MG/ML  SOLN COMPARISON:  MRI abdomen 10/02/2019.  CT abdomen/pelvis 08/06/2019. FINDINGS: Lower chest: Basilar atelectasis bilaterally. Hepatobiliary: Liver measures 18.5 cm craniocaudal length. 2-3 cm linear hypoattenuating focus in the anterior right liver is compatible with biopsy site. Previously described hypoattenuating lesions in the liver are again identified, stable comparing to CT of 08/06/2019. This includes small hypoattenuating subcapsular lesion in the tip of the lateral segment best seen on coronal 39/4 and subcapsular lesion adjacent to the gallbladder on coronal 36/4. These were better assessed by MRI of 10/02/2019. There is no evidence for gallstones, gallbladder wall thickening, or pericholecystic fluid. No intrahepatic or extrahepatic biliary dilation. Pancreas: No focal mass lesion. No dilatation of the main duct. No intraparenchymal cyst. No peripancreatic edema. Spleen: No splenomegaly. No focal  mass lesion. Adrenals/Urinary Tract: No adrenal nodule or mass. Kidneys unremarkable. No evidence for hydroureter. The urinary bladder appears normal for the degree of distention. Stomach/Bowel: Stomach is unremarkable. No gastric wall thickening. No evidence of outlet obstruction. Duodenum is normally positioned as is the ligament of Treitz. No small bowel wall thickening. No small bowel dilatation. The terminal ileum is normal. The appendix is normal. No gross colonic mass. No colonic wall thickening. Vascular/Lymphatic: No abdominal aortic aneurysm. No abdominal aortic atherosclerotic calcification. There is no gastrohepatic or hepatoduodenal ligament lymphadenopathy. No retroperitoneal or mesenteric lymphadenopathy. No pelvic sidewall lymphadenopathy. Reproductive: The uterus is unremarkable.  There is no adnexal mass. Other: No intraperitoneal free fluid. No perihepatic fluid collection. Musculoskeletal: No worrisome lytic or sclerotic osseous abnormality. IMPRESSION: 1. No acute findings in the abdomen or pelvis. Specifically, no perihepatic fluid collection. No intraperitoneal free fluid. 2. 2-3 cm linear hypoattenuating focus in the anterior right liver is compatible with biopsy site. 3. Previously described hypoattenuating lesions in the liver are stable in the interval. These were better assessed by MRI of 10/02/2019. Please see that report for follow-up recommendations. Electronically Signed   By: Misty Stanley M.D.   On: 12/15/2019 08:10    Procedures Procedures (including critical care time)  Medications Ordered in ED Medications  ondansetron (ZOFRAN-ODT) disintegrating tablet 4 mg (4 mg Oral Given 12/15/19 0712)  sodium chloride 0.9 % bolus 1,000 mL ( Intravenous Stopped 12/15/19 0921)  morphine 4 MG/ML injection 4 mg (4 mg Intravenous Given 12/15/19 0823)  ondansetron (ZOFRAN) injection 4 mg (4 mg Intravenous Given 12/15/19 0822)  iohexol (OMNIPAQUE) 300 MG/ML solution 100 mL (100 mLs  Intravenous Contrast Given 12/15/19 0737)    ED Course  I have reviewed the triage vital signs and the nursing notes.  Pertinent labs & imaging results that were available during my care of the patient were reviewed by me and considered in my medical decision making (see chart for details).  Clinical Course as of Dec 14 1045  Mon Dec 15, 2019  5449 I discussed the case with Dr. Pascal Lux, interventional radiology he independently reviewed the images and felt that we have appropriately ruled out the significant complications from that procedure.  Recommended pain management and follow-up as an outpatient.   [DF]    Clinical Course User Index [DF] Deno Etienne, DO   MDM Rules/Calculators/A&P  49 yo F with a chief complaints of epigastric and right upper quadrant abdominal pain after liver biopsy performed 3 days ago.  She has been having ongoing pain that is worsened over the past 24 hours.  Appears significantly uncomfortable on my initial exam.  We will treat her pain and nausea obtain a CT scan to assess for possible complication.  Lab work.  Reassess.  Patient is feeling better post pain medicine and IV fluids.  We will discharge the patient home.  GI and PCP follow-up.  We will do a trial of MiraLAX cleanout at home.  10:47 AM:  I have discussed the diagnosis/risks/treatment options with the patient and believe the pt to be eligible for discharge home to follow-up with PCP, GI. We also discussed returning to the ED immediately if new or worsening sx occur. We discussed the sx which are most concerning (e.g., sudden worsening pain, fever, inability to tolerate by mouth) that necessitate immediate return. Medications administered to the patient during their visit and any new prescriptions provided to the patient are listed below.  Medications given during this visit Medications  ondansetron (ZOFRAN-ODT) disintegrating tablet 4 mg (4 mg Oral Given 12/15/19 0712)  sodium  chloride 0.9 % bolus 1,000 mL ( Intravenous Stopped 12/15/19 0921)  morphine 4 MG/ML injection 4 mg (4 mg Intravenous Given 12/15/19 0823)  ondansetron (ZOFRAN) injection 4 mg (4 mg Intravenous Given 12/15/19 0822)  iohexol (OMNIPAQUE) 300 MG/ML solution 100 mL (100 mLs Intravenous Contrast Given 12/15/19 0737)     The patient appears reasonably screen and/or stabilized for discharge and I doubt any other medical condition or other [facility] requiring further screening, evaluation, or treatment in the ED at this time prior to discharge.   Final Clinical Impression(s) / ED Diagnoses Final diagnoses:  Epigastric abdominal pain    Rx / DC Orders ED Discharge Orders         Ordered    ondansetron (ZOFRAN ODT) 4 MG disintegrating tablet     Discontinue  Reprint     12/15/19 0948    morphine (MSIR) 15 MG tablet  Every 4 hours PRN     Discontinue  Reprint     12/15/19 [place], Tasfia Vasseur, DO 12/15/19 1047

## 2019-12-15 NOTE — ED Triage Notes (Signed)
Pt reports intermittent abdominal pain since liver biopsy at [facility] on Friday. States she had a difficult time controlling pain since procedure, states she will no take strong pain medicine. States pain makes breathing more difficult.

## 2019-12-15 NOTE — Telephone Encounter (Signed)
Pt is in er now

## 2019-12-15 NOTE — Discharge Instructions (Signed)
Take 8 scoops of miralax in 32oz of whatever you would like to drink.(Gatorade comes in this size) You can also use a fleets enema which you can buy over the counter at the pharmacy.  Return for worsening abdominal pain, vomiting or fever. ? ?

## 2019-12-15 NOTE — Telephone Encounter (Signed)
Patient has upcoming Botox appointment on 7/26. Patient has [place] [facility] on file, [place] #[phone]. (05/06/19- 05/05/20)

## 2019-12-16 ENCOUNTER — Other Ambulatory Visit: Payer: Self-pay | Admitting: Cardiology

## 2019-12-16 DIAGNOSIS — I493 Ventricular premature depolarization: Secondary | ICD-10-CM

## 2019-12-17 ENCOUNTER — Other Ambulatory Visit (HOSPITAL_COMMUNITY): Payer: Self-pay | Admitting: Physician Assistant

## 2019-12-17 ENCOUNTER — Telehealth (HOSPITAL_COMMUNITY): Payer: Self-pay | Admitting: Physician Assistant

## 2019-12-17 MED ORDER — KETOROLAC TROMETHAMINE 10 MG PO TABS: 10.0000 mg | ORAL_TABLET | Freq: Four times a day (QID) | ORAL | 0 refills | Status: AC

## 2019-12-17 MED ORDER — HALOPERIDOL 2 MG PO TABS
2.0000 mg | ORAL_TABLET | Freq: Two times a day (BID) | ORAL | 0 refills | Status: DC
Start: 2019-12-17 — End: 2019-12-29

## 2019-12-17 MED ORDER — OXYCODONE HCL 5 MG PO TABS: 5.0000 mg | ORAL_TABLET | ORAL | 0 refills | Status: DC | PRN

## 2019-12-17 NOTE — Telephone Encounter (Signed)
FYI

## 2019-12-17 NOTE — Telephone Encounter (Signed)
Dr Earleen Newport felt it was nerve issue and they called in pain meds to help ---- it should eventually improve  gabepentin can be tried --- 100 mg tid #60  But if that does not work I do not know what else to try but pain meds

## 2019-12-17 NOTE — Telephone Encounter (Signed)
  Patient had a random liver biopsy by Dr. Earleen Newport at [facility] on 12/12/19.  She had continued to have severe RUQ pain ever since.  She was seen at [facility] ED in [facility] on [address] on 12/15/19.  She also feels she is constipated, but she says the pain in her RUQ is "different".  CT scan done at that time was normal.   Discussed with Dr. Earleen Newport and he reviewed her images.  He feels there could have been a cutaneous nerve injury.  She is not currently taking any narcotic pain medications.  I have sent in [place] (she has tolerated oxycodone in the past as long as she eats prior to taking it).  I will also call in Toradol and low dose Haldol for her pain.  I recommend a laxative or suppository for her constipation and warned her that the Roxicodone could make her constipation worse. She understands.   She understands if she gets worse she is to go the ED. She can also give [name] a call back tomorrow.  Halley Shepheard S Tranika Scholler PA-C 12/17/2019 10:48 AM

## 2019-12-18 ENCOUNTER — Encounter: Payer: Self-pay | Admitting: Family Medicine

## 2019-12-18 ENCOUNTER — Other Ambulatory Visit: Payer: Self-pay | Admitting: Family Medicine

## 2019-12-18 MED ORDER — IBUPROFEN 800 MG PO TABS: 800.0000 mg | ORAL_TABLET | Freq: Four times a day (QID) | ORAL | 3 refills | Status: DC | PRN

## 2019-12-18 NOTE — Telephone Encounter (Signed)
Please advise. Ibuprofen has not been fill since 2018.

## 2019-12-18 NOTE — Telephone Encounter (Signed)
I sent it in but make sure she knows the ibuprofen and toradol are both nsaids --- should not take them together

## 2019-12-25 ENCOUNTER — Ambulatory Visit: Payer: BC Managed Care – PPO | Admitting: Cardiology

## 2019-12-29 ENCOUNTER — Other Ambulatory Visit: Payer: Self-pay

## 2019-12-29 ENCOUNTER — Telehealth: Payer: Self-pay | Admitting: *Deleted

## 2019-12-29 ENCOUNTER — Ambulatory Visit: Payer: BC Managed Care – PPO | Admitting: Neurology

## 2019-12-29 ENCOUNTER — Ambulatory Visit (INDEPENDENT_AMBULATORY_CARE_PROVIDER_SITE_OTHER): Payer: BC Managed Care – PPO | Admitting: Family Medicine

## 2019-12-29 DIAGNOSIS — G43719 Chronic migraine without aura, intractable, without status migrainosus: Secondary | ICD-10-CM

## 2019-12-29 NOTE — Telephone Encounter (Signed)
LMVM for pt on mobile that Dr Rexene Alberts out today, needing to reshedule appt.  Please call back to reschedule.

## 2019-12-29 NOTE — Progress Notes (Signed)
Botox- 200 units x 1 vial Lot: [id number] Expiration: 09/2022 NDC: 1833-5825-18  Bacteriostatic 0.9% Sodium Chloride- 34mL total Lot: [id number] Expiration: 01/19/2020 NDC: 1281-1886-77  Dx: J73.668 B/B

## 2019-12-29 NOTE — Progress Notes (Signed)
This is her 9th Botox procedure. Last Botox procedure was in 05/2019. She reports that she has been sick and has not come in for Botox. She is having regular migraines. She is using rizatriptan, Ubrlevy and ibuprofen for abortive therapy. I have encouraged her to avoid taking abortive medications more than 1-2 times per week. She also notified me after Botox procedure that she had gotten cosmetic Botox about a month ago. I have advised that she avoid getting cosmetic Botox within 12 weeks of Botox for migraines due to increased risks of side effects. She verbalizes understanding.    Consent Form Botulism Toxin Injection For Chronic Migraine    Reviewed orally with patient, additionally signature is on file:  Botulism toxin has been approved by the Federal drug administration for treatment of chronic migraine. Botulism toxin does not cure chronic migraine and it may not be effective in some patients.  The administration of botulism toxin is accomplished by injecting a small amount of toxin into the muscles of the neck and head. Dosage must be titrated for each individual. Any benefits resulting from botulism toxin tend to wear off after 3 months with a repeat injection required if benefit is to be maintained. Injections are usually done every 3-4 months with maximum effect peak achieved by about 2 or 3 weeks. Botulism toxin is expensive and you should be sure of what costs you will incur resulting from the injection.  The side effects of botulism toxin use for chronic migraine may include:   -Transient, and usually mild, facial weakness with facial injections  -Transient, and usually mild, head or neck weakness with head/neck injections  -Reduction or loss of forehead facial animation due to forehead muscle weakness  -Eyelid drooping  -Dry eye  -Pain at the site of injection or bruising at the site of injection  -Double vision  -Potential unknown long term risks   Contraindications: You  should not have Botox if you are pregnant, nursing, allergic to albumin, have an infection, skin condition, or muscle weakness at the site of the injection, or have myasthenia gravis, Lambert-Eaton syndrome, or ALS.  It is also possible that as with any injection, there may be an allergic reaction or no effect from the medication. Reduced effectiveness after repeated injections is sometimes seen and rarely infection at the injection site may occur. All care will be taken to prevent these side effects. If therapy is given over a long time, atrophy and wasting in the muscle injected may occur. Occasionally the patient's become refractory to treatment because they develop antibodies to the toxin. In this event, therapy needs to be modified.  I have read the above information and consent to the administration of botulism toxin.    BOTOX PROCEDURE NOTE FOR MIGRAINE HEADACHE  Contraindications and precautions discussed with patient(above). Aseptic procedure was observed and patient tolerated procedure. Procedure performed by [name], FNP-C.   The condition has existed for more than 6 months, and pt does not have a diagnosis of ALS, Myasthenia Gravis or Lambert-Eaton Syndrome.  Risks and benefits of injections discussed and pt agrees to proceed with the procedure.  Written consent obtained  These injections are medically necessary. Pt  receives good benefits from these injections. These injections do not cause sedations or hallucinations which the oral therapies may cause.   Description of procedure:  The patient was placed in a sitting position. The standard protocol was used for Botox as follows, with 5 units of Botox injected at each site:  -  Procerus muscle, midline injection  -Corrugator muscle, bilateral injection  -Frontalis muscle, bilateral injection, with 2 sites each side, medial injection was performed in the upper one third of the frontalis muscle, in the region vertical from the  medial inferior edge of the superior orbital rim. The lateral injection was again in the upper one third of the forehead vertically above the lateral limbus of the cornea, 1.5 cm lateral to the medial injection site.  -Temporalis muscle injection, 4 sites, bilaterally. The first injection was 3 cm above the tragus of the ear, second injection site was 1.5 cm to 3 cm up from the first injection site in line with the tragus of the ear. The third injection site was 1.5-3 cm forward between the first 2 injection sites. The fourth injection site was 1.5 cm posterior to the second injection site. 5th site laterally in the temporalis  muscleat the level of the outer canthus.  -Occipitalis muscle injection, 3 sites, bilaterally. The first injection was done one half way between the occipital protuberance and the tip of the mastoid process behind the ear. The second injection site was done lateral and superior to the first, 1 fingerbreadth from the first injection. The third injection site was 1 fingerbreadth superiorly and medially from the first injection site.  -Cervical paraspinal muscle injection, 2 sites, bilaterally. The first injection site was 1 cm from the midline of the cervical spine, 3 cm inferior to the lower border of the occipital protuberance. The second injection site was 1.5 cm superiorly and laterally to the first injection site.  -Trapezius muscle injection was performed at 3 sites, bilaterally. The first injection site was in the upper trapezius muscle halfway between the inflection point of the neck, and the acromion. The second injection site was one half way between the acromion and the first injection site. The third injection was done between the first injection site and the inflection point of the neck.   Will return for repeat injection in 3 months.   A total of 200 units of Botox was prepared, 155 units of Botox was injected as documented above, any Botox not injected was wasted.  The patient tolerated the procedure well, there were no complications of the above procedure.

## 2019-12-30 ENCOUNTER — Encounter: Payer: Self-pay | Admitting: Family Medicine

## 2019-12-30 DIAGNOSIS — R351 Nocturia: Secondary | ICD-10-CM | POA: Diagnosis not present

## 2019-12-30 DIAGNOSIS — M6281 Muscle weakness (generalized): Secondary | ICD-10-CM | POA: Diagnosis not present

## 2019-12-30 DIAGNOSIS — N393 Stress incontinence (female) (male): Secondary | ICD-10-CM | POA: Diagnosis not present

## 2019-12-30 DIAGNOSIS — R102 Pelvic and perineal pain: Secondary | ICD-10-CM | POA: Diagnosis not present

## 2019-12-31 NOTE — Telephone Encounter (Signed)
In person----- she has not been seen in a while Has she spoken to the specialist about this ?   She is seeing neuro and GI

## 2019-12-31 NOTE — Telephone Encounter (Signed)
VV or in person?

## 2020-01-02 DIAGNOSIS — K761 Chronic passive congestion of liver: Secondary | ICD-10-CM | POA: Diagnosis not present

## 2020-01-05 ENCOUNTER — Other Ambulatory Visit: Payer: Self-pay

## 2020-01-05 ENCOUNTER — Encounter: Payer: Self-pay | Admitting: Family Medicine

## 2020-01-05 ENCOUNTER — Ambulatory Visit (INDEPENDENT_AMBULATORY_CARE_PROVIDER_SITE_OTHER): Payer: BC Managed Care – PPO | Admitting: Family Medicine

## 2020-01-05 DIAGNOSIS — R05 Cough: Secondary | ICD-10-CM | POA: Diagnosis not present

## 2020-01-05 DIAGNOSIS — Z8616 Personal history of COVID-19: Secondary | ICD-10-CM | POA: Diagnosis not present

## 2020-01-05 DIAGNOSIS — R059 Cough, unspecified: Secondary | ICD-10-CM

## 2020-01-05 MED ORDER — PROMETHAZINE-DM 6.25-15 MG/5ML PO SYRP: ORAL_SOLUTION | ORAL | 0 refills | Status: DC

## 2020-01-05 NOTE — Progress Notes (Signed)
Patient ID: Morgan Long, female    DOB: [birth year]  Age: 49 y.o. MRN: [id number]    Subjective:  Subjective  HPI Morgan Long presents for f/u covid.  She still has sob and heaviness on chest.  She is seeing pulmonary at [facility] and GI for the liver congestion and nausea. .  She sees Dr Bettina Gavia for cardiology and [facility] for neuro / migraines.   Pt still feels like she can not work due to lingering effects of covid    Review of Systems  Constitutional: Positive for fatigue. Negative for appetite change, diaphoresis and unexpected weight change.  Eyes: Negative for pain, redness and visual disturbance.  Respiratory: Positive for shortness of breath. Negative for cough, chest tightness and wheezing.   Cardiovascular: Negative for chest pain, palpitations and leg swelling.  Endocrine: Negative for cold intolerance, heat intolerance, polydipsia, polyphagia and polyuria.  Genitourinary: Negative for difficulty urinating, dysuria and frequency.  Neurological: Negative for dizziness, light-headedness, numbness and headaches.    History Past Medical History:  Diagnosis Date  . Allergic rhinitis   . Asthma   . Chronic migraine    Dr. Catalina Gravel  . Constipation   . Dry eye syndrome of both lacrimal glands   . Dry skin   . Endometriosis   . Fatigue   . Gastroparesis   . GERD (gastroesophageal reflux disease)   . Headache(784.0)    occasional, dx w/ Migraines before, Topamax helps  . Heartburn   . Hepatic steatosis   . History of stomach ulcers   . Lactose intolerance   . Localized edema   . Migraine   . Muscle weakness (generalized)   . Nuclear cataract of both eyes    Mild  . Overweight   . Pain in right ankle and joints of right foot   . Shortness of breath   . Sinus complaint   . Sinusitis   . Tired   . TMJ pain dysfunction syndrome    occasional    She has a past surgical history that includes fallopian tube removed; Breast enhancement surgery (2006); endrometroisis;  Augmentation mammaplasty (Bilateral, 2006); wisdoim teeth extraction; Colonoscopy; Upper gastrointestinal endoscopy; Bunionectomy; and Ankle surgery (Left, 11/16/2017).   Her family history includes Alcoholism in her mother; Asthma in an other family member; Breast cancer in her maternal aunt, maternal aunt, maternal aunt, and maternal aunt; Cancer in her father and mother; Cancer - Other (age of onset: 16) in her mother; Colon cancer in her paternal grandfather; Colon cancer (age of onset: 28) in her maternal uncle; Diabetes in an other family member; Heart disease in her father; Hypertension in her maternal aunt; Pancreatic cancer in her maternal aunt; Throat cancer in her maternal uncle and mother.She reports that she has never smoked. She has never used smokeless tobacco. She reports current alcohol use. She reports that she does not use drugs.  Current Outpatient Medications on File Prior to Visit  Medication Sig Dispense Refill  . acebutolol (SECTRAL) 200 MG capsule TAKE 1 CAPSULE (200 MG TOTAL) BY MOUTH DAILY. FOR PALPITATION AND PVC'S 90 capsule 2  . albuterol (PROAIR HFA) 108 (90 Base) MCG/ACT inhaler Inhale 2 puffs into the lungs every 6 (six) hours as needed for wheezing or shortness of breath. 18 g 2  . BD PEN NEEDLE NANO U/F 32G X 4 MM MISC 1 PACKAGE BY DOES NOT APPLY ROUTE 2 (TWO) TIMES DAILY. 100 each 0  . bimatoprost (LATISSE) 0.03 % ophthalmic solution APPLY ALONG UPPER EYELID  MARGIN AT BASE OF EYELASHES ONCE A DAY    . cetirizine (ZYRTEC) 10 MG tablet TAKE 1 TABLET BY MOUTH DAILY 30 tablet 5  . cycloSPORINE (RESTASIS MULTIDOSE) 0.05 % ophthalmic emulsion Place 1 drop into both eyes as needed. 0.4 mL 3  . ESTROGEL 0.75 MG/1.25 GM (0.06%) topical gel Apply 1 application topically See admin instructions.  0  . etonogestrel-ethinyl estradiol (NUVARING) 0.12-0.015 MG/24HR vaginal ring Place 1 each vaginally every 28 (twenty-eight) days. Insert vaginally and leave in place for 3 consecutive  weeks, then remove for 1 week.     Marland Kitchen ibuprofen (ADVIL) 800 MG tablet Take 1 tablet (800 mg total) by mouth every 6 (six) hours as needed. for pain 30 tablet 3  . levocetirizine (XYZAL) 5 MG tablet TAKE 1 TABLET (5 MG TOTAL) BY MOUTH DAILY AS NEEDED FOR ALLERGIES. 90 tablet 1  . linaclotide (LINZESS) 72 MCG capsule Take 1 capsule (72 mcg total) by mouth daily before breakfast. 30 capsule 6  . Multiple Vitamin (MULTIVITAMIN) LIQD Take 5 mLs by mouth daily.    . ondansetron (ZOFRAN ODT) 4 MG disintegrating tablet 4mg  ODT q4 hours prn nausea/vomit 20 tablet 0  . oxyCODONE (ROXICODONE) 5 MG immediate release tablet Take 1 tablet (5 mg total) by mouth every 4 (four) hours as needed for severe pain. 30 tablet 0  . PAZEO 0.7 % SOLN     . phentermine 15 MG capsule Take 1 capsule (15 mg total) by mouth every morning. (Patient taking differently: Take 15 mg by mouth as needed. ) 30 capsule 0  . potassium chloride SA (KLOR-CON) 20 MEQ tablet Take 1 tablet (20 mEq total) by mouth daily. 30 tablet 6  . PREVIDENT 5000 SENSITIVE 1.1-5 % PSTE USE BEFORE BED DO NOT RINSE    . promethazine (PHENERGAN) 12.5 MG tablet Take 1 tablet (12.5 mg total) by mouth every 6 (six) hours as needed for nausea or vomiting. 30 tablet 1  . rizatriptan (MAXALT-MLT) 10 MG disintegrating tablet TAKE 1 TABLET (10 MG TOTAL) BY MOUTH AS NEEDED FOR MIGRAINE. MAY REPEAT IN 2 HOURS IF NEEDED 9 tablet 11  . SAXENDA 18 MG/3ML SOPN INJECT 3 MG INTO THE SKIN DAILY. (NOT COVERED) CMD 15 mL 0  . spironolactone (ALDACTONE) 25 MG tablet Take 25 mg by mouth daily.     Marland Kitchen terconazole (TERAZOL 7) 0.4 % vaginal cream PLACE 1 APPLICATOR VAGINALLY AT BEDTIME. 45 g 0  . Ubrogepant ([name]) 50 MG TABS Take 50 mg by mouth 2 (two) times daily as needed (1 pill at onset of migraine, may repeat in 2 hours. no more than 4 pills a day). 10 tablet 3  . Vitamin D, Ergocalciferol, (DRISDOL) 1.25 MG (50000 UNIT) CAPS capsule Take 1 capsule (50,000 Units total) by mouth  every 7 (seven) days. 12 capsule 1  . azelastine (ASTELIN) 0.1 % nasal spray PLACE 2 SPRAYS INTO BOTH NOSTRILS 2 (TWO) TIMES DAILY. (Patient not taking: Reported on 01/05/2020) 90 mL 0   No current facility-administered medications on file prior to visit.     Objective:  Objective  Physical Exam Vitals and nursing note reviewed.  Constitutional:      Appearance: She is well-developed.  HENT:     Head: Normocephalic and atraumatic.  Eyes:     Conjunctiva/sclera: Conjunctivae normal.  Neck:     Thyroid: No thyromegaly.     Vascular: No carotid bruit or JVD.  Cardiovascular:     Rate and Rhythm: Normal rate and regular  rhythm.     Heart sounds: Normal heart sounds. No murmur heard.   Pulmonary:     Effort: Pulmonary effort is normal. No respiratory distress.     Breath sounds: Normal breath sounds. No wheezing or rales.  Chest:     Chest wall: No tenderness.  Musculoskeletal:     Cervical back: Normal range of motion and neck supple.  Neurological:     Mental Status: She is alert and oriented to person, place, and time.    BP 112/60 (BP Location: Right Arm, Patient Position: Sitting, Cuff Size: Large)   Pulse 93   Temp 98.7 F (37.1 C) (Oral)   Resp 18   Ht 5\' 3"  (1.6 m)   Wt 232 lb 6.4 oz (105.4 kg)   SpO2 98%   BMI 41.17 kg/m  Wt Readings from Last 3 Encounters:  01/05/20 232 lb 6.4 oz (105.4 kg)  12/15/19 230 lb (104.3 kg)  10/08/19 231 lb (104.8 kg)     Lab Results  Component Value Date   WBC 12.0 (H) 12/15/2019   HGB 12.5 12/15/2019   HCT 38.8 12/15/2019   PLT 360 12/15/2019   GLUCOSE 103 (H) 12/15/2019   CHOL 191 05/06/2018   TRIG 93 05/21/2019   HDL 65 05/06/2018   LDLCALC 104 (H) 05/06/2018   ALT 20 12/15/2019   AST 18 12/15/2019   NA 140 12/15/2019   K 4.7 12/15/2019   CL 106 12/15/2019   CREATININE 0.88 12/15/2019   BUN 16 12/15/2019   CO2 25 12/15/2019   TSH 0.922 05/06/2018   INR 1.0 12/15/2019   HGBA1C 5.9 (H) 05/06/2018    CT ABDOMEN  PELVIS W CONTRAST  Result Date: 12/15/2019 CLINICAL DATA:  Abdominal pain and tenderness. Liver biopsy 2 days ago. EXAM: CT ABDOMEN AND PELVIS WITH CONTRAST TECHNIQUE: Multidetector CT imaging of the abdomen and pelvis was performed using the standard protocol following bolus administration of intravenous contrast. CONTRAST:  178mL OMNIPAQUE IOHEXOL 300 MG/ML  SOLN COMPARISON:  MRI abdomen 10/02/2019.  CT abdomen/pelvis 08/06/2019. FINDINGS: Lower chest: Basilar atelectasis bilaterally. Hepatobiliary: Liver measures 18.5 cm craniocaudal length. 2-3 cm linear hypoattenuating focus in the anterior right liver is compatible with biopsy site. Previously described hypoattenuating lesions in the liver are again identified, stable comparing to CT of 08/06/2019. This includes small hypoattenuating subcapsular lesion in the tip of the lateral segment best seen on coronal 39/4 and subcapsular lesion adjacent to the gallbladder on coronal 36/4. These were better assessed by MRI of 10/02/2019. There is no evidence for gallstones, gallbladder wall thickening, or pericholecystic fluid. No intrahepatic or extrahepatic biliary dilation. Pancreas: No focal mass lesion. No dilatation of the main duct. No intraparenchymal cyst. No peripancreatic edema. Spleen: No splenomegaly. No focal mass lesion. Adrenals/Urinary Tract: No adrenal nodule or mass. Kidneys unremarkable. No evidence for hydroureter. The urinary bladder appears normal for the degree of distention. Stomach/Bowel: Stomach is unremarkable. No gastric wall thickening. No evidence of outlet obstruction. Duodenum is normally positioned as is the ligament of Treitz. No small bowel wall thickening. No small bowel dilatation. The terminal ileum is normal. The appendix is normal. No gross colonic mass. No colonic wall thickening. Vascular/Lymphatic: No abdominal aortic aneurysm. No abdominal aortic atherosclerotic calcification. There is no gastrohepatic or hepatoduodenal  ligament lymphadenopathy. No retroperitoneal or mesenteric lymphadenopathy. No pelvic sidewall lymphadenopathy. Reproductive: The uterus is unremarkable.  There is no adnexal mass. Other: No intraperitoneal free fluid. No perihepatic fluid collection. Musculoskeletal: No worrisome lytic or sclerotic  osseous abnormality. IMPRESSION: 1. No acute findings in the abdomen or pelvis. Specifically, no perihepatic fluid collection. No intraperitoneal free fluid. 2. 2-3 cm linear hypoattenuating focus in the anterior right liver is compatible with biopsy site. 3. Previously described hypoattenuating lesions in the liver are stable in the interval. These were better assessed by MRI of 10/02/2019. Please see that report for follow-up recommendations. Electronically Signed   By: Misty Stanley M.D.   On: 12/15/2019 08:10     Assessment & Plan:  Plan  I am having Marthe A. Helbling maintain her etonogestrel-ethinyl estradiol, multivitamin, Estrogel, rizatriptan, cetirizine, Saxenda, BD Pen Needle Nano U/F, phentermine, albuterol, Pazeo, PreviDent 5000 Sensitive, bimatoprost, spironolactone, linaclotide, [name], promethazine, levocetirizine, azelastine, potassium chloride SA, terconazole, Vitamin D (Ergocalciferol), cycloSPORINE, ondansetron, acebutolol, oxyCODONE, ibuprofen, and promethazine-dextromethorphan.  Meds ordered this encounter  Medications  . promethazine-dextromethorphan (PROMETHAZINE-DM) 6.25-15 MG/5ML syrup    Sig: TAKE 5MLS BY MOUTH 4 TIMES DAILY AS NEEDED    Dispense:  118 mL    Refill:  0    PT REQUEST REFILL    Problem List Items Addressed This Visit      Unprioritized   History of COVID-19    Pt still with fatigue, fogginess and chest pressure Pt seeing pulm, cardio, neuro and ent Will fill out fmla for 2 months--- note written and given to pt        Other Visit Diagnoses    Morbid obesity ([place])    -  Primary   Relevant Orders   Amb Ref to Medical Weight Management   Cough        Relevant Medications   promethazine-dextromethorphan (PROMETHAZINE-DM) 6.25-15 MG/5ML syrup      Follow-up: Return in about 2 months (around 03/06/2020).  [name], DO

## 2020-01-05 NOTE — Assessment & Plan Note (Addendum)
Pt still with fatigue, fogginess and chest pressure Pt seeing pulm, cardio, neuro and ent Will fill out fmla for 2 months--- note written and given to pt

## 2020-01-13 ENCOUNTER — Telehealth: Payer: Self-pay | Admitting: Neurology

## 2020-01-13 DIAGNOSIS — R102 Pelvic and perineal pain: Secondary | ICD-10-CM | POA: Diagnosis not present

## 2020-01-13 DIAGNOSIS — R351 Nocturia: Secondary | ICD-10-CM | POA: Diagnosis not present

## 2020-01-13 DIAGNOSIS — N393 Stress incontinence (female) (male): Secondary | ICD-10-CM | POA: Diagnosis not present

## 2020-01-13 DIAGNOSIS — M6281 Muscle weakness (generalized): Secondary | ICD-10-CM | POA: Diagnosis not present

## 2020-01-13 NOTE — Telephone Encounter (Signed)
Received a PA request from pharmacy for [place]. PA was started on [email or web address]. Key is [id number]. Per [email or web address], a determination will be made within 72 hours. Will check back on status later.

## 2020-01-15 NOTE — Telephone Encounter (Signed)
Received notification from [email or web address] that the PA was approved from 01/13/20 to 01/11/21.   Pharmacy was made aware of approval.

## 2020-01-18 ENCOUNTER — Encounter: Payer: Self-pay | Admitting: Family Medicine

## 2020-01-19 NOTE — Telephone Encounter (Signed)
Not sure what she is asking ---- she is seeing all the specialist so not sure covid cllinic would help now

## 2020-01-23 NOTE — Telephone Encounter (Signed)
I think patient is wanting to know if you know what could be causing her liver issues?

## 2020-01-25 NOTE — Telephone Encounter (Signed)
I do not know but it looks like GI is doing everything they can to figure it out----  I don't think post covid clinic would do anything now --- she is seeing all the specialists that they would send her too See what GI says on the 27th --- i'll be looking for that as well.

## 2020-01-30 DIAGNOSIS — K761 Chronic passive congestion of liver: Secondary | ICD-10-CM | POA: Diagnosis not present

## 2020-01-30 DIAGNOSIS — Z8616 Personal history of COVID-19: Secondary | ICD-10-CM | POA: Diagnosis not present

## 2020-02-02 ENCOUNTER — Ambulatory Visit: Payer: BC Managed Care – PPO | Admitting: Internal Medicine

## 2020-02-18 ENCOUNTER — Other Ambulatory Visit: Payer: Self-pay | Admitting: Cardiology

## 2020-02-18 ENCOUNTER — Other Ambulatory Visit: Payer: Self-pay | Admitting: Allergy and Immunology

## 2020-02-18 ENCOUNTER — Other Ambulatory Visit: Payer: Self-pay | Admitting: Family Medicine

## 2020-02-18 DIAGNOSIS — E559 Vitamin D deficiency, unspecified: Secondary | ICD-10-CM

## 2020-02-18 NOTE — Telephone Encounter (Signed)
Please review request for Furosemide, this is no longer listed under active medications, please advise if appropriate to refill. KW

## 2020-02-19 ENCOUNTER — Other Ambulatory Visit: Payer: Self-pay | Admitting: Family Medicine

## 2020-02-19 DIAGNOSIS — R600 Localized edema: Secondary | ICD-10-CM

## 2020-02-19 NOTE — Telephone Encounter (Signed)
Looks like cardiology d/c this med back on 09/04/19.  Do you want her to continue?  Or should she call cardiology?

## 2020-02-23 DIAGNOSIS — K761 Chronic passive congestion of liver: Secondary | ICD-10-CM | POA: Diagnosis not present

## 2020-02-24 DIAGNOSIS — R351 Nocturia: Secondary | ICD-10-CM | POA: Diagnosis not present

## 2020-02-24 DIAGNOSIS — N393 Stress incontinence (female) (male): Secondary | ICD-10-CM | POA: Diagnosis not present

## 2020-02-24 DIAGNOSIS — M6281 Muscle weakness (generalized): Secondary | ICD-10-CM | POA: Diagnosis not present

## 2020-02-24 DIAGNOSIS — R102 Pelvic and perineal pain: Secondary | ICD-10-CM | POA: Diagnosis not present

## 2020-03-02 ENCOUNTER — Encounter: Payer: Self-pay | Admitting: Family Medicine

## 2020-03-02 DIAGNOSIS — J45909 Unspecified asthma, uncomplicated: Secondary | ICD-10-CM | POA: Insufficient documentation

## 2020-03-02 DIAGNOSIS — M6281 Muscle weakness (generalized): Secondary | ICD-10-CM | POA: Insufficient documentation

## 2020-03-02 DIAGNOSIS — L853 Xerosis cutis: Secondary | ICD-10-CM | POA: Insufficient documentation

## 2020-03-02 DIAGNOSIS — Z8711 Personal history of peptic ulcer disease: Secondary | ICD-10-CM | POA: Insufficient documentation

## 2020-03-02 DIAGNOSIS — R5383 Other fatigue: Secondary | ICD-10-CM | POA: Insufficient documentation

## 2020-03-02 DIAGNOSIS — K5900 Constipation, unspecified: Secondary | ICD-10-CM | POA: Insufficient documentation

## 2020-03-02 DIAGNOSIS — R0989 Other specified symptoms and signs involving the circulatory and respiratory systems: Secondary | ICD-10-CM | POA: Insufficient documentation

## 2020-03-02 DIAGNOSIS — H04123 Dry eye syndrome of bilateral lacrimal glands: Secondary | ICD-10-CM | POA: Insufficient documentation

## 2020-03-02 DIAGNOSIS — E739 Lactose intolerance, unspecified: Secondary | ICD-10-CM | POA: Insufficient documentation

## 2020-03-02 DIAGNOSIS — IMO0002 Reserved for concepts with insufficient information to code with codable children: Secondary | ICD-10-CM | POA: Insufficient documentation

## 2020-03-02 DIAGNOSIS — M25571 Pain in right ankle and joints of right foot: Secondary | ICD-10-CM | POA: Insufficient documentation

## 2020-03-02 DIAGNOSIS — R0602 Shortness of breath: Secondary | ICD-10-CM | POA: Insufficient documentation

## 2020-03-02 DIAGNOSIS — M26629 Arthralgia of temporomandibular joint, unspecified side: Secondary | ICD-10-CM | POA: Insufficient documentation

## 2020-03-02 DIAGNOSIS — R600 Localized edema: Secondary | ICD-10-CM | POA: Insufficient documentation

## 2020-03-02 DIAGNOSIS — K760 Fatty (change of) liver, not elsewhere classified: Secondary | ICD-10-CM | POA: Insufficient documentation

## 2020-03-02 DIAGNOSIS — R12 Heartburn: Secondary | ICD-10-CM | POA: Insufficient documentation

## 2020-03-02 DIAGNOSIS — G43909 Migraine, unspecified, not intractable, without status migrainosus: Secondary | ICD-10-CM | POA: Insufficient documentation

## 2020-03-02 DIAGNOSIS — E663 Overweight: Secondary | ICD-10-CM | POA: Insufficient documentation

## 2020-03-02 NOTE — Progress Notes (Signed)
Cardiology Office Note:    Date:  03/03/2020   ID:  Morgan Long, DOB 02/08/1971, MRN [id number]  PCP:  Carollee Herter, Alferd Apa, DO  Cardiologist:  [name] More, MD    Referring MD: Carollee Herter, Alferd Apa, *    ASSESSMENT:    1. PVC (premature ventricular contraction)   2. Bilateral leg edema    PLAN:    In order of problems listed above:  1. I am very comfortable that her PVCs are not life-threatening improved with none on a twelve-lead EKG and occurred in the context of COVID-19 infection and convalescence.  We tried a low-dose beta-blocker she could not tolerate it I would not put her on antiarrhythmic drug and with a normal cardiac echo and BNP I do not think she needs a hemodynamic cath or cardiac MRI.  I told her I cannot give her an answer for why she has hepatic congestion refer back to GI.  I offered to see her in the future as needed but she like to come back and see me in 6 months. 2. No evidence of heart failure presently not on diuretic   Next appointment: 6 months   Medication Adjustments/Labs and Tests Ordered: Current medicines are reviewed at length with the patient today.  Concerns regarding medicines are outlined above.  No orders of the defined types were placed in this encounter.  No orders of the defined types were placed in this encounter.   Chief Complaint  Patient presents with   Follow-up    Frequent PVCs    History of Present Illness:    Morgan Long is a 49 y.o. female with a hx of COVID-19 infection frequent PVCs and ongoing shortness of breath last seen 09/17/2019.  Echocardiogram performed 09/01/2019 showed EF of 60 to 65% and no findings of cardiomyopathy.  Her ZIO monitor showed frequent ventricular ectopy 7.8% burden with no episodes of ventricular tachycardia.  She is seen in the ED in July with right upper quadrant pain after liver biopsy, CMP showed mild hypocalcemia low albumin otherwise normal potassium 4.7 creatinine 0.88 GFR  greater than 60 cc minute white count was elevated 12,000 hemoglobin 12.5.  She was seen by GI in follow-up 01/30/2020 recommended repeat echocardiogram and concerns of hepatic congestion from biopsy.  Echocardiograms performed [facility] 02/23/2020 showing a normal left ventricular ejection fraction normal left ventricular filling pressure normal right ventricular size and function there was no tricuspid regurgitation of the IVC was normal in size with collapse indicating normal right atrial pressure.  Compliance with diet, lifestyle and medications: Yes  From a cardiac perspective she is doing better much less arrhythmia and it tends to occur in episodes related to stress.  Not having shortness of breath still has a mild degree of peripheral edema no chest pain syncope.  EKG today shows no PVCs and is normal.  I reviewed the results of the echocardiogram at [facility].  A few times a micrographs in people with severe right heart failure developed liver disease as not the case here. Past Medical History:  Diagnosis Date   Allergic rhinitis    Asthma    Chronic migraine    Dr. Catalina Gravel   Constipation    Dry eye syndrome of both lacrimal glands    Dry skin    Endometriosis    Fatigue    Gastroparesis    GERD (gastroesophageal reflux disease)    Headache(784.0)    occasional, dx w/ Migraines before,  Topamax helps   Heartburn    Hepatic steatosis    History of stomach ulcers    Lactose intolerance    Localized edema    Migraine    Muscle weakness (generalized)    Nuclear cataract of both eyes    Mild   Overweight    Pain in right ankle and joints of right foot    Shortness of breath    Sinus complaint    Sinusitis    Tired    TMJ pain dysfunction syndrome    occasional    Past Surgical History:  Procedure Laterality Date   ANKLE SURGERY Left 11/16/2017   AUGMENTATION MAMMAPLASTY Bilateral 2006   BREAST ENHANCEMENT SURGERY  2006    BUNIONECTOMY     COLONOSCOPY     endrometroisis     fallopian tube removed     Left   UPPER GASTROINTESTINAL ENDOSCOPY     wisdoim teeth extraction      Current Medications: Current Meds  Medication Sig   albuterol (PROAIR HFA) 108 (90 Base) MCG/ACT inhaler Inhale 2 puffs into the lungs every 6 (six) hours as needed for wheezing or shortness of breath.   BD PEN NEEDLE NANO U/F 32G X 4 MM MISC 1 PACKAGE BY DOES NOT APPLY ROUTE 2 (TWO) TIMES DAILY.   bimatoprost (LATISSE) 0.03 % ophthalmic solution APPLY ALONG UPPER EYELID MARGIN AT BASE OF EYELASHES ONCE A DAY   cetirizine (ZYRTEC) 10 MG tablet TAKE 1 TABLET BY MOUTH DAILY   cycloSPORINE (RESTASIS MULTIDOSE) 0.05 % ophthalmic emulsion Place 1 drop into both eyes as needed.   ESTROGEL 0.75 MG/1.25 GM (0.06%) topical gel Apply 1 application topically See admin instructions.   etonogestrel-ethinyl estradiol (NUVARING) 0.12-0.015 MG/24HR vaginal ring Place 1 each vaginally every 28 (twenty-eight) days. Insert vaginally and leave in place for 3 consecutive weeks, then remove for 1 week.    ibuprofen (ADVIL) 800 MG tablet Take 1 tablet (800 mg total) by mouth every 6 (six) hours as needed. for pain   linaclotide (LINZESS) 72 MCG capsule Take 1 capsule (72 mcg total) by mouth daily before breakfast.   Multiple Vitamin (MULTIVITAMIN) LIQD Take 5 mLs by mouth daily.   ondansetron (ZOFRAN ODT) 4 MG disintegrating tablet 4mg  ODT q4 hours prn nausea/vomit   phentermine 15 MG capsule Take 1 capsule (15 mg total) by mouth every morning. (Patient taking differently: Take 15 mg by mouth as needed. )   potassium chloride SA (KLOR-CON) 20 MEQ tablet Take 1 tablet (20 mEq total) by mouth daily.   PREVIDENT 5000 SENSITIVE 1.1-5 % PSTE USE BEFORE BED DO NOT RINSE   promethazine (PHENERGAN) 12.5 MG tablet Take 1 tablet (12.5 mg total) by mouth every 6 (six) hours as needed for nausea or vomiting.   promethazine-dextromethorphan  (PROMETHAZINE-DM) 6.25-15 MG/5ML syrup TAKE 5MLS BY MOUTH 4 TIMES DAILY AS NEEDED   rizatriptan (MAXALT-MLT) 10 MG disintegrating tablet TAKE 1 TABLET (10 MG TOTAL) BY MOUTH AS NEEDED FOR MIGRAINE. MAY REPEAT IN 2 HOURS IF NEEDED   SAXENDA 18 MG/3ML SOPN INJECT 3 MG INTO THE SKIN DAILY. (NOT COVERED) CMD   Ubrogepant ([name]) 50 MG TABS Take 50 mg by mouth 2 (two) times daily as needed (1 pill at onset of migraine, may repeat in 2 hours. no more than 4 pills a day).   Vitamin D, Ergocalciferol, (DRISDOL) 1.25 MG (50000 UNIT) CAPS capsule TAKE 1 CAPSULE (50,000 UNITS TOTAL) BY MOUTH EVERY 7 (SEVEN) DAYS.     Allergies:  Acetaminophen-codeine, Hydrocodone, Oxycodone, and Codeine   Social History   Socioeconomic History   Marital status: Single    Spouse name: Not on file   Number of children: 0   Years of education: BS   Highest education level: Not on file  Occupational History   Occupation: Paramedic, going to school    Employer: [employer]   Occupation: Air traffic controller  Tobacco Use   Smoking status: Never Smoker   Smokeless tobacco: Never Used  Vaping Use   Vaping Use: Never used  Substance and Sexual Activity   Alcohol use: Yes    Alcohol/week: 0.0 standard drinks    Comment: socially - occasional    Drug use: No   Sexual activity: Yes    Birth control/protection: Inserts    Comment: nuvaring  Other Topics Concern   Not on file  Social History Narrative   Household:sister and her 3 kids    Drinks occasional starbucks drink       Social Determinants of Health   Financial Resource Strain:    Difficulty of Paying Living Expenses: Not on file  Food Insecurity:    Worried About Charity fundraiser in the Last Year: Not on file   [employer] of Food in the Last Year: Not on file  Transportation Needs:    Lack of Transportation (Medical): Not on file   Lack of Transportation (Non-Medical): Not on file  Physical Activity:    Days of  Exercise per Week: Not on file   Minutes of Exercise per Session: Not on file  Stress:    Feeling of Stress : Not on file  Social Connections:    Frequency of Communication with Friends and Family: Not on file   Frequency of Social Gatherings with Friends and Family: Not on file   Attends Religious Services: Not on file   Active Member of Clubs or Organizations: Not on file   Attends Archivist Meetings: Not on file   Marital Status: Not on file     Family History: The patient's family history includes Alcoholism in her mother; Asthma in an other family member; Breast cancer in her maternal aunt, maternal aunt, maternal aunt, and maternal aunt; Cancer in her father and mother; Cancer - Other (age of onset: 55) in her mother; Colon cancer in her paternal grandfather; Colon cancer (age of onset: 47) in her maternal uncle; Diabetes in an other family member; Heart disease in her father; Hypertension in her maternal aunt; Pancreatic cancer in her maternal aunt; Throat cancer in her maternal uncle and mother. There is no history of Heart attack, Rectal cancer, or Stomach cancer. ROS:   Please see the history of present illness.    All other systems reviewed and are negative.  EKGs/Labs/Other Studies Reviewed:    The following studies were reviewed today:  EKG:  EKG ordered today and personally reviewed.  The ekg ordered today demonstrates sinus rhythm normal no PVCs  Recent Labs: 08/14/2019: NT-Pro BNP 86 09/04/2019: BNP 23.4 09/17/2019: Magnesium 2.0 12/15/2019: ALT 20; BUN 16; Creatinine, Ser 0.88; Hemoglobin 12.5; Platelets 360; Potassium 4.7; Sodium 140  Recent Lipid Panel    Component Value Date/Time   CHOL 191 05/06/2018 1421   TRIG 93 05/21/2019 2145   HDL 65 05/06/2018 1421   CHOLHDL 3 05/14/2014 1223   VLDL 12.2 05/14/2014 1223   LDLCALC 104 (H) 05/06/2018 1421    Physical Exam:    VS:  BP 110/64  Pulse 90    Ht 5\' 3"  (1.6 m)    Wt 232 lb (105.2 kg)     SpO2 95%    BMI 41.10 kg/m     Wt Readings from Last 3 Encounters:  03/03/20 232 lb (105.2 kg)  01/05/20 232 lb 6.4 oz (105.4 kg)  12/15/19 230 lb (104.3 kg)     GEN:  Well nourished, well developed in no acute distress HEENT: Normal NECK: No JVD; No carotid bruits LYMPHATICS: No lymphadenopathy CARDIAC: RRR, no murmurs, rubs, gallops RESPIRATORY:  Clear to auscultation without rales, wheezing or rhonchi  ABDOMEN: Soft, non-tender, non-distended MUSCULOSKELETAL:  No edema; No deformity  SKIN: Warm and dry NEUROLOGIC:  Alert and oriented x 3 PSYCHIATRIC:  Normal affect    Signed, [name] More, MD  03/03/2020 10:01 AM    [place]

## 2020-03-03 ENCOUNTER — Other Ambulatory Visit: Payer: Self-pay

## 2020-03-03 ENCOUNTER — Encounter: Payer: Self-pay | Admitting: Cardiology

## 2020-03-03 ENCOUNTER — Ambulatory Visit: Payer: BC Managed Care – PPO | Admitting: Cardiology

## 2020-03-03 VITALS — BP 110/64 | HR 90 | Ht 63.0 in | Wt 232.0 lb

## 2020-03-03 DIAGNOSIS — R600 Localized edema: Secondary | ICD-10-CM

## 2020-03-03 DIAGNOSIS — I493 Ventricular premature depolarization: Secondary | ICD-10-CM | POA: Diagnosis not present

## 2020-03-03 NOTE — Addendum Note (Signed)
Addended by: Resa Miner I on: 03/03/2020 04:06 PM   Modules accepted: Orders

## 2020-03-03 NOTE — Patient Instructions (Signed)

## 2020-03-04 DIAGNOSIS — M25572 Pain in left ankle and joints of left foot: Secondary | ICD-10-CM | POA: Diagnosis not present

## 2020-03-04 DIAGNOSIS — M659 Synovitis and tenosynovitis, unspecified: Secondary | ICD-10-CM | POA: Diagnosis not present

## 2020-03-05 ENCOUNTER — Encounter: Payer: Self-pay | Admitting: Family Medicine

## 2020-03-12 ENCOUNTER — Ambulatory Visit: Payer: BC Managed Care – PPO | Attending: Internal Medicine

## 2020-03-12 ENCOUNTER — Other Ambulatory Visit: Payer: Self-pay

## 2020-03-12 ENCOUNTER — Other Ambulatory Visit (HOSPITAL_BASED_OUTPATIENT_CLINIC_OR_DEPARTMENT_OTHER): Payer: Self-pay | Admitting: Internal Medicine

## 2020-03-12 DIAGNOSIS — Z23 Encounter for immunization: Secondary | ICD-10-CM

## 2020-03-12 MED FILL — FLUARIX QUADRIVALENT 0.5 ML: 0.5 | 1 days supply | Qty: 1 | Fill #0

## 2020-03-12 NOTE — Progress Notes (Signed)
° °  Covid-19 Vaccination Clinic  Name:  Morgan Long    MRN: [id number] DOB: [birth year]  03/12/2020  [name] was observed post Covid-19 immunization for 15 minutes without incident. She was provided with Vaccine Information Sheet and instruction to access the V-Safe system. Vaccinated by [employer].  [name] was instructed to call 911 with any severe reactions post vaccine:  Difficulty breathing   Swelling of face and throat   A fast heartbeat   A bad rash all over body   Dizziness and weakness

## 2020-03-18 DIAGNOSIS — L74519 Primary focal hyperhidrosis, unspecified: Secondary | ICD-10-CM | POA: Diagnosis not present

## 2020-03-18 DIAGNOSIS — L219 Seborrheic dermatitis, unspecified: Secondary | ICD-10-CM | POA: Diagnosis not present

## 2020-03-18 DIAGNOSIS — L700 Acne vulgaris: Secondary | ICD-10-CM | POA: Diagnosis not present

## 2020-03-19 MED FILL — PFIZER-BIONTECH COVID-19 VA: 30 | 1 days supply | Qty: 0 | Fill #0

## 2020-03-24 DIAGNOSIS — Q447 Other congenital malformations of liver: Secondary | ICD-10-CM

## 2020-03-24 DIAGNOSIS — Q441 Other congenital malformations of gallbladder: Secondary | ICD-10-CM

## 2020-03-26 ENCOUNTER — Other Ambulatory Visit: Payer: Self-pay | Admitting: Family Medicine

## 2020-03-26 DIAGNOSIS — H04123 Dry eye syndrome of bilateral lacrimal glands: Secondary | ICD-10-CM

## 2020-03-31 ENCOUNTER — Other Ambulatory Visit: Payer: Self-pay

## 2020-03-31 ENCOUNTER — Encounter: Payer: Self-pay | Admitting: Neurology

## 2020-03-31 ENCOUNTER — Ambulatory Visit (INDEPENDENT_AMBULATORY_CARE_PROVIDER_SITE_OTHER): Payer: BC Managed Care – PPO | Admitting: Neurology

## 2020-03-31 VITALS — BP 119/70 | HR 79 | Ht 63.0 in | Wt 236.5 lb

## 2020-03-31 DIAGNOSIS — G43719 Chronic migraine without aura, intractable, without status migrainosus: Secondary | ICD-10-CM

## 2020-03-31 MED ORDER — ONABOTULINUMTOXINA 100 UNITS IJ SOLR
100.0000 [IU] | Freq: Once | INTRAMUSCULAR | Status: AC
  Administered 2020-03-31: 100 [IU] via INTRAMUSCULAR

## 2020-03-31 MED ORDER — UBRELVY 50 MG PO TABS: 50.0000 mg | ORAL_TABLET | Freq: Two times a day (BID) | ORAL | 3 refills | Status: DC | PRN

## 2020-03-31 NOTE — Progress Notes (Signed)
Morgan Long is a 49 year old right-handed woman with an underlying medical history of allergic rhinitis, endometriosis, TMJ problems, Reflux disease, gastroparesis, hepatic congestion with steatosis, lactose intolerance, neck pain, and obesity, who presents for her 10th Botox injection for her intractable migraines.  The patient is unaccompanied today.  She had her eighth injection on 05/08/2019 at which time she reported that she had not taken the [name].  She was unsure if she had any refills.  She presented with a migraine.  She tolerated the Botox injection reasonably well.  She missed an injection in between and had her ninth injection with Morgan Long, nurse practitioner on 12/29/2019.  Today, 03/31/2020: She reports doing better, last injection went well in July with Morgan Long, nurse practitioner.  She still has intermittent migraines, she needs a new prescription for [name].  She has mostly right temporal headaches.  Written informed consent for recurrent, 3 monthly intramuscular injections with botulinum toxin for this indication has previously been obtained and scanned into the patient's electronic chart. I will re-consent if the type of botulinum toxin used or the indication for injection changes for this patient in the future. The patient is informed that we will use the same consent for her recurrent, most likely 3 monthly injections. She demonstrated understanding and voiced agreement.   I have previously talked to the patient in detail about expectations, limitations, benefits as well as potential adverse effects of botulinum toxin injections. The patient understands that the side effects include (but are not limited to): Mouth dryness, dryness of eyes, speech and swallowing difficulties, respiratory depression or problems breathing, weakness of muscles including more distant muscles than the ones injected, flu-like symptoms, myalgias, injection site reactions such as redness, itching, swelling,  pain, and infection.   200 units of botulinum toxin type A were reconstituted using preservative-free normal saline to a concentration of 10 units per 0.1 mL and drawn up into 1 mL tuberculin syringes. Lot number was  [id number], expiration date February 2024 for both 100 unit vials, buy and bill.    O/E: BP 119/70    Pulse 79    Ht 5' 3"  (1.6 m)    Wt 236 lb 8 oz (107.3 kg)    BMI 41.89 kg/m    The patient was situated in a chair, sitting comfortably. After preparing the areas with 70% isopropyl alcohol and using a 26 gauge 1 1/2 inch hollow lumen recording EMG needle for the neck injections as well as a 30 gauge 1 inch needle for the facial injections, a total dose of 155 units of botulinum toxin type A in the form of Botox was injected into the muscles and the following distribution and quantities:   #1: 10 units on the right and 10 units in the left frontalis muscles, broken down in 2 sites on each side. #2: 5 units in the right and 5 units in the left corrugator muscles. #3: 15 units in the right and 15 units in the left occipitalis muscles, broken down in 3 sites on each side. #4: 20 units in the right and 20 units in the left temporalis muscles, broken down in 4 sites on each side.  #5: 15 units on the right and 15 units in the left upper trapezius muscles, broken down in 3 sites on each side. #6: 10 units in the right and 10 units in the left splenius capitis muscles, broken down in 2 sites on each side.  #7: 2.5 units in the right and 2.5  units in the left procerus muscles.   EMG guidance was utilized for the neck injections with mild EMG activity noted, especially in the splenius capitis muscles bilaterally.    A dose of 45 units out of a total dose of 200 units was discarded as unavoidable waste.    The patient tolerated the procedure well without immediate complications. She was advised to make a followup appointment for repeat injections in 3 months from now and encouraged to call us  with any interim questions, concerns, problems, or updates. She was in agreement and did not have any questions prior to leaving clinic today.   Previously:    I saw her on 01/30/2019 for her seventh injection, at which time she reported that the Botox injections were very effective and that the new medication [name] was more helpful for acute use than her triptan before.   I saw her on 10/24/2018 for her 6th injection, at which time she had noticed a recent flareup of her migraines.  She did not think that the [name] had helped that much.  She was a little overdue for her Botox injection at the time.  I saw her on 07/17/2018 for her fifth injection, at which time she reported Botox had been helpful, she reported overall very good results, no side effects, her migraines had reduced by at least 7 days a month, she also reported reduction by at least 100 hours/month of her migraine headaches.  She did report that she had received cosmetic Botox injection in her eyebrow area.    I saw her on 04/10/2018 for her 4th injection, at which time she reported good results with her Botox injections in significant reduction in her headache frequency and severely overall. She denied any side effects. She was overdue by nearly 2 weeks for her injection at the time.   I saw her on 12/26/2017 for her 3rd injection, at which time she reported having had a recent increase in her headache frequency, she had recent foot surgery.   I saw her on 09/25/2017 for her second round of injections. She felt that in the week prior to the injection her headaches were more intense.    I saw her on 06/28/2017 for her initial Botox injection, at which time she received 155 units of Botox.    She was seen by her primary care physician and received a Toradol injection on 06/19/2017 as well as a prescription for Relpax.    04/25/17: Dr. Melton Alar retired. She reports a long-standing history of migraine headaches, she recalls she was  about 49 years old when she started having migraines. For quite some time she had spontaneous improvement in her migraines but some 4 or 5 years ago started having more frequent migraines. She has tried and failed multiple preventative medications but does not have a list with her and prior records from Dr. Audery Amel office are not available today. She reports that his office is closed at this point. She did not get her records from his office. Perhaps her primary care provider has records. I encouraged her to try to obtain those records as it will help [name] manage her migraines optimally. She recalls having tried Topamax. She had hair loss from some medications but is not completely sure which ones. Topamax did not help, Zonegran caused her sedation, nortriptyline was also tried but she is unsure what happened with it. Amitriptyline she may have tried as well. She did not try Depakote. She is worried about sedation side  effects as she also is going to school and has to be alert throughout the day. She takes occasional nausea medicine which likely is Zofran under the tongue. She has tried Maxalt under the tongue which is helpful at times. She needs a refill on that. She has regular eye exams because of her history of dry eyes. She used to wear a single contact but was advised to stop using it.  She has had 2 rounds of Botox injections, last time about 3 months ago as she recalls. She remembers doing okay with it. Prior to starting Botox injections she reports having about 18-20 headache days per month typically. She does not always have a typical aura but sometimes has visual blurriness and bringing in her ears to warn her about an upcoming migraine headache. She has occasional nausea that precedes the headache.   I saw her on 06/09/2015 at which time she was referred is a new patient referral for a new problem, referred by her optometrist at the time for 1 month history of blurry vision. Her exam at the time was  nonfocal and reassuringly she had no significant eye related findings. I suggested further workup in the form of visual evoked potentials, blood work, and she also reported a 6 month history of feeling tired. She had no one-sided weakness, tingling or numbness. Symptoms from the past which included paresthesias had resolved completely. She was under the care of Dr. Melton Alar for migraines. She was in the process of titrating Zonegran. She was on 225 mg each night. When she took 300 mg each night she felt too sleepy during the day. She denied any symptoms of sleep disordered breathing. She did admit not being a good sleeper. She did endorse stress as she was in school online for psychology and was also working off-and-on in Personal assistant. She reported not drinking sodas, not drinking alcohol and she reported not smoking. She did report pain with eye movements and dry eyes.  She reported no family history of multiple sclerosis or lupus. She denied joint pain with the exception of bilateral knee pain and she also reported a 50 pound weight gain in the last year. She had visual evoked potentials on 07/06/2015: Impression: The visual evoked response test above was within normal limits bilaterally. No evidence of conduction slowing was seen within the anterior visual pathways on either side on today's evaluation.   We called her with her test results. Labs from 06/09/2015 showed normal A1c, normal vitamin D level, normal ANA, normal RF, normal ESR. CRP was elevated at 15.6. 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   She had a brain MRI with and without contrast on 06/23/2015: IMPRESSION:  This is a normal MRI of the brain with and without contrast  In addition, personally reviewed the images through the PACS system. We called her with her test results.    I first met her on 05/22/2014 at the request of her neurosurgeon, Dr. Kathyrn Sheriff, at which time the patient reported intermittent right arm numbness, particularly with neck position changes. I suggested blood work and EMG and nerve conduction testing of the right upper extremity. Her blood work showed elevated B12 and B6 levels, indicative of B vitamin supplementation. Hemoglobin A1c was 5.7. We called her with her test results. She had EMG and nerve conduction testing on 06/02/2014:  IMPRESSION:   Nerve conduction studies done on both upper extremities were unremarkable, without evidence of a neuropathy seen. EMG evaluation of the right upper extremity was unremarkable, without evidence of an overlying cervical radiculopathy. We called her with her test results. At the time, she reported improved symptoms.   05/22/2014: She has intermittent right arm numbness. Her symptoms have been going on for about 6 months. She has not noted any permanent numbness and no issues elsewhere. She does not have any significant weakness and sometimes feels weak when the numbness seems to come on. It goes away if she changes positions or adjusts her neck position. She has had right shoulder problems and pain in the right shoulder.   You saw her on 05/14/2014 for neck pain. She had undergone physical therapy without improvement of her neck pain. She had cervical epidural steroid injections which helped for about 24 hours as understand.    She had a C-spine MRI without contrast on 01/07/2014: Mild cervical spondylosis as described above without significant disc protrusion, foraminal stenosis or central canal stenosis.    Blood work from 05/14/2014 was reviewed: She had a BMP, CBC with differential, liver function panel, lipid panel and TSH all of which were fine with the exception of a borderline LDL of 111.

## 2020-03-31 NOTE — Patient Instructions (Signed)
Please remember, botulinum toxin takes about 3-7 days to kick in. As discussed, this is not a pain shot. The purpose of the injections is to gradually improve your symptoms. In some patients it takes up to 2-3 weeks to make a difference and it wears off with time. Sometimes it may wear off before it is time for the next injection. We still should wait till the next 3 monthly injection, because injecting too frequently may cause you to develop immunity to the botulinum toxin. We are looking for a reduction in the severity and/or frequency of your symptoms. As a reminder, side effects to look out for are (but not limited to): mouth dryness, dryness of the eyes, heaviness of your head or muscle weakness, including droopy face or droopy eyelid(s), rarely: speech or swallowing difficulties and very rarely: breathing difficulties. Some people have transient neck pain or soreness which typically responds to over-the-counter anti-inflammatory medication and local heat application with a heat pad. If you think you have a severe reaction to the botulinum toxin, such as weakness, trouble speaking, trouble breathing, or trouble swallowing, you have to call 911 or have someone take you to the nearest emergency room. However, most people have either no or minimal side effects from the injections. It is normal to have a little bit of redness and swelling around the injection sites which usually improves after a few hours. Rarely, there may be a bruise that improves on its own. Most side effects reported are very mild and resolve within 10-14 days. Please feel free to call us if you have any additional questions or concerns: [phone] or email us through My Chart. We may have to adjust the dose over time, depending on your results from this injection and your overall response over time to this medication.   

## 2020-04-07 DIAGNOSIS — K761 Chronic passive congestion of liver: Secondary | ICD-10-CM | POA: Diagnosis not present

## 2020-04-12 DIAGNOSIS — L219 Seborrheic dermatitis, unspecified: Secondary | ICD-10-CM | POA: Diagnosis not present

## 2020-04-12 DIAGNOSIS — L658 Other specified nonscarring hair loss: Secondary | ICD-10-CM | POA: Diagnosis not present

## 2020-04-13 ENCOUNTER — Telehealth: Payer: Self-pay | Admitting: Neurology

## 2020-04-13 NOTE — Telephone Encounter (Signed)
Patient recently had a Botox injection with Dr. Rexene Alberts. Patient was not scheduled for her next injection due to having an outstanding balance with [facility] for Botox. Per Billing, patient must pay this before she can be scheduled.

## 2020-04-15 ENCOUNTER — Encounter: Payer: Self-pay | Admitting: Family Medicine

## 2020-04-15 NOTE — Telephone Encounter (Signed)
Pt was advised to a UC or downstairs and pt agreed. Right now, nothing in Epic that she has been evaluated.

## 2020-04-15 NOTE — Telephone Encounter (Signed)
Spoke with pt. Pt states having chest pain x1 day but having a chest pressure and a pulled feeling x1 week. Pt states pain is on the left side and goes to between her breast and around her neck. Pt states no injury and states using stretch bands recently. Pt states some dizziness. Pt states some SOB but that may be from having COVID. Pt advised to go to a UC or downstairs to be evaluated.

## 2020-04-16 ENCOUNTER — Other Ambulatory Visit: Payer: Self-pay | Admitting: Cardiology

## 2020-04-16 ENCOUNTER — Other Ambulatory Visit: Payer: Self-pay | Admitting: Allergy and Immunology

## 2020-04-19 ENCOUNTER — Encounter: Payer: Self-pay | Admitting: Family Medicine

## 2020-04-20 ENCOUNTER — Other Ambulatory Visit: Payer: Self-pay | Admitting: Family Medicine

## 2020-04-20 DIAGNOSIS — R932 Abnormal findings on diagnostic imaging of liver and biliary tract: Secondary | ICD-10-CM | POA: Diagnosis not present

## 2020-04-20 DIAGNOSIS — Z6835 Body mass index (BMI) 35.0-35.9, adult: Secondary | ICD-10-CM

## 2020-04-20 MED ORDER — BD PEN NEEDLE NANO U/F 32G X 4 MM MISC: 0 refills | Status: DC

## 2020-04-20 MED ORDER — SAXENDA 18 MG/3ML ~~LOC~~ SOPN: PEN_INJECTOR | SUBCUTANEOUS | 0 refills | Status: DC

## 2020-04-20 NOTE — Telephone Encounter (Signed)
Please advise 

## 2020-04-21 ENCOUNTER — Telehealth: Payer: Self-pay

## 2020-04-21 NOTE — Telephone Encounter (Signed)
PA initiated via Covermymeds; KEY:  [id number]. Awaiting determination.

## 2020-04-22 ENCOUNTER — Encounter: Payer: Self-pay | Admitting: Family Medicine

## 2020-04-23 ENCOUNTER — Encounter: Payer: Self-pay | Admitting: Family Medicine

## 2020-04-23 NOTE — Telephone Encounter (Signed)
We can give hep a/b vaccine-- twinrix 0,1 and 6 months ----- 3 doses

## 2020-04-23 NOTE — Telephone Encounter (Signed)
Yes--- even more of a reason to get it

## 2020-04-23 NOTE — Telephone Encounter (Signed)
PA approved. Effective from 04/21/2020 through 08/24/2020.

## 2020-04-23 NOTE — Telephone Encounter (Signed)
Morgan Long started PA for saxenda. Waiting on determination. Please advise if patient needs titer/lab appointment or to come see you. I do not see any Hep B in her immunization records.

## 2020-04-26 ENCOUNTER — Encounter: Payer: Self-pay | Admitting: Family Medicine

## 2020-04-26 MED ORDER — LINACLOTIDE 72 MCG PO CAPS: 72.0000 ug | ORAL_CAPSULE | Freq: Every day | ORAL | 6 refills | Status: DC

## 2020-04-26 NOTE — Telephone Encounter (Signed)
Ok to refill for 6 months 

## 2020-04-26 NOTE — Telephone Encounter (Signed)
Letter done

## 2020-04-27 ENCOUNTER — Encounter: Payer: Self-pay | Admitting: Family Medicine

## 2020-04-27 DIAGNOSIS — G43719 Chronic migraine without aura, intractable, without status migrainosus: Secondary | ICD-10-CM | POA: Diagnosis not present

## 2020-04-28 ENCOUNTER — Telehealth: Payer: Self-pay

## 2020-04-28 NOTE — Telephone Encounter (Signed)
PA initiated via Covermymeds; KEY: [id number]. Awaiting determination.

## 2020-05-03 DIAGNOSIS — L74519 Primary focal hyperhidrosis, unspecified: Secondary | ICD-10-CM | POA: Diagnosis not present

## 2020-05-04 DIAGNOSIS — R932 Abnormal findings on diagnostic imaging of liver and biliary tract: Secondary | ICD-10-CM | POA: Diagnosis not present

## 2020-05-04 DIAGNOSIS — K769 Liver disease, unspecified: Secondary | ICD-10-CM | POA: Insufficient documentation

## 2020-05-04 NOTE — Telephone Encounter (Signed)
PA approved. Effective from 04/28/2020 through 04/27/2021.

## 2020-05-06 ENCOUNTER — Encounter: Payer: Self-pay | Admitting: Family Medicine

## 2020-05-08 ENCOUNTER — Other Ambulatory Visit: Payer: Self-pay | Admitting: Family Medicine

## 2020-05-08 MED ORDER — PROMETHAZINE-DM 6.25-15 MG/5ML PO SYRP: 5.0000 mL | ORAL_SOLUTION | Freq: Four times a day (QID) | ORAL | 0 refills | Status: DC | PRN

## 2020-05-08 NOTE — Telephone Encounter (Signed)
I sent phenergan dm in but she will need ov if no better ----

## 2020-05-11 DIAGNOSIS — R102 Pelvic and perineal pain: Secondary | ICD-10-CM | POA: Diagnosis not present

## 2020-05-11 DIAGNOSIS — N760 Acute vaginitis: Secondary | ICD-10-CM | POA: Diagnosis not present

## 2020-05-12 ENCOUNTER — Other Ambulatory Visit: Payer: Self-pay

## 2020-05-12 ENCOUNTER — Ambulatory Visit (INDEPENDENT_AMBULATORY_CARE_PROVIDER_SITE_OTHER): Payer: BC Managed Care – PPO

## 2020-05-12 DIAGNOSIS — Z23 Encounter for immunization: Secondary | ICD-10-CM | POA: Diagnosis not present

## 2020-05-12 NOTE — Progress Notes (Signed)
Pt seen today for Twinrix immunization. Dose 1 tolerated well.VIS given to pt. No other concerns

## 2020-05-13 DIAGNOSIS — K769 Liver disease, unspecified: Secondary | ICD-10-CM | POA: Diagnosis not present

## 2020-05-17 ENCOUNTER — Encounter: Payer: Self-pay | Admitting: Family Medicine

## 2020-05-17 NOTE — Telephone Encounter (Signed)
I completely [place] and agree--- if it was me, I'd what the surgery too.   Let us know how it goes>  Dr Kennieth Rad

## 2020-05-18 ENCOUNTER — Telehealth: Payer: Self-pay

## 2020-05-18 NOTE — Telephone Encounter (Signed)
PA is not reviewable per [employer]. There is already an approved PA on file until 01/2021.

## 2020-05-18 NOTE — Telephone Encounter (Signed)
Received a PA request for ubrevly. Completed via CMM and sent to [facility]. Key: [id number]. Should have a determination in 3-5 business days.

## 2020-05-19 ENCOUNTER — Telehealth: Payer: Self-pay | Admitting: Cardiology

## 2020-05-19 NOTE — Telephone Encounter (Signed)
EKG faxed per request

## 2020-05-19 NOTE — Telephone Encounter (Signed)
Shirlean Mylar is calling requesting the patient's 03/03/20 EKG results be faxed over to [phone].

## 2020-05-20 DIAGNOSIS — Z79890 Hormone replacement therapy (postmenopausal): Secondary | ICD-10-CM | POA: Diagnosis not present

## 2020-05-20 DIAGNOSIS — J45909 Unspecified asthma, uncomplicated: Secondary | ICD-10-CM | POA: Diagnosis not present

## 2020-05-20 DIAGNOSIS — G43909 Migraine, unspecified, not intractable, without status migrainosus: Secondary | ICD-10-CM | POA: Diagnosis not present

## 2020-05-20 DIAGNOSIS — D134 Benign neoplasm of liver: Secondary | ICD-10-CM | POA: Diagnosis not present

## 2020-05-20 DIAGNOSIS — Z793 Long term (current) use of hormonal contraceptives: Secondary | ICD-10-CM | POA: Diagnosis not present

## 2020-05-20 DIAGNOSIS — L409 Psoriasis, unspecified: Secondary | ICD-10-CM | POA: Diagnosis not present

## 2020-05-20 DIAGNOSIS — Z840 Family history of diseases of the skin and subcutaneous tissue: Secondary | ICD-10-CM | POA: Diagnosis not present

## 2020-05-20 DIAGNOSIS — Z886 Allergy status to analgesic agent status: Secondary | ICD-10-CM | POA: Diagnosis not present

## 2020-05-20 DIAGNOSIS — C220 Liver cell carcinoma: Secondary | ICD-10-CM | POA: Diagnosis not present

## 2020-05-20 DIAGNOSIS — G8918 Other acute postprocedural pain: Secondary | ICD-10-CM | POA: Diagnosis not present

## 2020-05-20 DIAGNOSIS — E669 Obesity, unspecified: Secondary | ICD-10-CM | POA: Diagnosis not present

## 2020-05-20 DIAGNOSIS — Z885 Allergy status to narcotic agent status: Secondary | ICD-10-CM | POA: Diagnosis not present

## 2020-05-20 DIAGNOSIS — Z6841 Body Mass Index (BMI) 40.0 and over, adult: Secondary | ICD-10-CM | POA: Diagnosis not present

## 2020-05-20 DIAGNOSIS — Z79899 Other long term (current) drug therapy: Secondary | ICD-10-CM | POA: Diagnosis not present

## 2020-05-20 DIAGNOSIS — K769 Liver disease, unspecified: Secondary | ICD-10-CM | POA: Diagnosis not present

## 2020-05-20 DIAGNOSIS — Z8616 Personal history of COVID-19: Secondary | ICD-10-CM | POA: Diagnosis not present

## 2020-05-20 DIAGNOSIS — K7689 Other specified diseases of liver: Secondary | ICD-10-CM | POA: Diagnosis not present

## 2020-05-24 ENCOUNTER — Other Ambulatory Visit: Payer: Self-pay | Admitting: Family Medicine

## 2020-05-24 DIAGNOSIS — Z6835 Body mass index (BMI) 35.0-35.9, adult: Secondary | ICD-10-CM

## 2020-06-08 DIAGNOSIS — D134 Benign neoplasm of liver: Secondary | ICD-10-CM | POA: Insufficient documentation

## 2020-06-17 ENCOUNTER — Encounter: Payer: Self-pay | Admitting: Family Medicine

## 2020-06-24 ENCOUNTER — Other Ambulatory Visit: Payer: Self-pay

## 2020-06-24 ENCOUNTER — Other Ambulatory Visit (INDEPENDENT_AMBULATORY_CARE_PROVIDER_SITE_OTHER): Payer: BC Managed Care – PPO

## 2020-06-24 ENCOUNTER — Ambulatory Visit (INDEPENDENT_AMBULATORY_CARE_PROVIDER_SITE_OTHER): Payer: BC Managed Care – PPO

## 2020-06-24 DIAGNOSIS — Z23 Encounter for immunization: Secondary | ICD-10-CM | POA: Diagnosis not present

## 2020-06-24 DIAGNOSIS — Z111 Encounter for screening for respiratory tuberculosis: Secondary | ICD-10-CM

## 2020-06-24 NOTE — Progress Notes (Signed)
Pt here for second dose of the Twinrix vaccine. Given on her left deltoid. thrid dose was scheduled for 10/26/20. Pt tolerated well. - JMA

## 2020-06-26 LAB — QUANTIFERON-TB GOLD PLUS
Mitogen-NIL: >10 IU/mL
NIL: 0.02 IU/mL
QuantiFERON-TB Gold Plus: NEGATIVE
TB1-NIL: 0 IU/mL
TB2-NIL: 0 IU/mL

## 2020-06-29 ENCOUNTER — Telehealth: Payer: Self-pay | Admitting: Family Medicine

## 2020-06-29 NOTE — Telephone Encounter (Signed)
Pt dropped off document to be filled out by provider (Pre Employment Work Clearance Form 1 page) Pt would like to be called when document ready to pick up tel [phone]. Document put at front office tray under providers name.

## 2020-06-30 NOTE — Telephone Encounter (Signed)
Received

## 2020-06-30 NOTE — Telephone Encounter (Signed)
TB [employer] printed and placed in to be sign folder

## 2020-07-02 NOTE — Telephone Encounter (Signed)
Received back from [facility]. Pt aware that form is ready for pick up. Form placed in the pickup bin.

## 2020-07-05 DIAGNOSIS — D134 Benign neoplasm of liver: Secondary | ICD-10-CM | POA: Diagnosis not present

## 2020-07-07 NOTE — Telephone Encounter (Signed)
Ok to schedule with NP for Botox.

## 2020-07-07 NOTE — Telephone Encounter (Deleted)
Patient has called and paid her balance and can be rescheduled. Dr. Rexene Alberts doesn't have any openings until 08/2020 and patient's last injection was 10/27. Is it okay to put patient on Sarah's schedule?

## 2020-07-07 NOTE — Telephone Encounter (Signed)
Patient is scheduled for 2/7 with Sarah. Patient's PA for Botox with [employer] expired 05/05/20. I filled out new PA form, MD signed & I faxed to [facility] with notes.

## 2020-07-08 DIAGNOSIS — L301 Dyshidrosis [pompholyx]: Secondary | ICD-10-CM | POA: Diagnosis not present

## 2020-07-12 ENCOUNTER — Ambulatory Visit: Payer: BC Managed Care – PPO | Admitting: Neurology

## 2020-07-12 ENCOUNTER — Telehealth: Payer: Self-pay | Admitting: Neurology

## 2020-07-12 ENCOUNTER — Encounter: Payer: Self-pay | Admitting: Neurology

## 2020-07-12 ENCOUNTER — Other Ambulatory Visit: Payer: Self-pay

## 2020-07-12 DIAGNOSIS — G43709 Chronic migraine without aura, not intractable, without status migrainosus: Secondary | ICD-10-CM

## 2020-07-12 DIAGNOSIS — G43719 Chronic migraine without aura, intractable, without status migrainosus: Secondary | ICD-10-CM | POA: Diagnosis not present

## 2020-07-12 NOTE — Telephone Encounter (Signed)
Received approval from [facility] via fax. PA #[id number] (07/07/20- 07/06/21).

## 2020-07-12 NOTE — Telephone Encounter (Signed)
Patient needs f/u appointment with Dr. Rexene Alberts for botox in 3 months would be willing to see me if Dr. Rexene Alberts is unavailable. Is concerned she may be late again for her Botox scheduling.

## 2020-07-12 NOTE — Progress Notes (Signed)
Botox- 200 units x 1 vial Lot: [id number] Expiration: 03/2023 NDC: 3875-6433-29   Bacteriostatic 0.9% Sodium Chloride- 48mL total [id number] Expiration: 6301601 NDC: 09323-557-32   B/B

## 2020-07-12 NOTE — Telephone Encounter (Signed)
Called the patient this morning after her appointment and scheduled her with Dr. Rexene Alberts for 5/9 at 1:00. Patient was only late for her Botox this time due to outstanding balance that she paid last week.

## 2020-07-12 NOTE — Procedures (Signed)
     BOTOX PROCEDURE NOTE FOR MIGRAINE HEADACHE   HISTORY: Morgan Long is a 50 year old female with history of chronic migraine headache, presents today for her 11th Botox injection for chronic migraines.  Her last injection was in November 2021 with Dr. Rexene Alberts.  In the last 3 months, has had rare headache, up until the last 2 weeks.  [name] for acute headache with fair benefit.  She also receives cosmetic Botox, mostly around her eyes, nothing in her forehead, has been told, higher risk for adverse for effect with dual Botox therapy. Works 2 jobs.    Description of procedure:  The patient was placed in a sitting position. The standard protocol was used for Botox as follows, with 5 units of Botox injected at each site:   -Procerus muscle, midline injection  -Corrugator muscle, bilateral injection  -Frontalis muscle, bilateral injection, with 2 sites each side, medial injection was performed in the upper one third of the frontalis muscle, in the region vertical from the medial inferior edge of the superior orbital rim. The lateral injection was again in the upper one third of the forehead vertically above the lateral limbus of the cornea, 1.5 cm lateral to the medial injection site.  -Temporalis muscle injection, 4 sites, bilaterally. The first injection was 3 cm above the tragus of the ear, second injection site was 1.5 cm to 3 cm up from the first injection site in line with the tragus of the ear. The third injection site was 1.5-3 cm forward between the first 2 injection sites. The fourth injection site was 1.5 cm posterior to the second injection site.  -Occipitalis muscle injection, 3 sites, bilaterally. The first injection was done one half way between the occipital protuberance and the tip of the mastoid process behind the ear. The second injection site was done lateral and superior to the first, 1 fingerbreadth from the first injection. The third injection site was 1  fingerbreadth superiorly and medially from the first injection site.  -Cervical paraspinal muscle injection, 2 sites, bilateral, the first injection site was 1 cm from the midline of the cervical spine, 3 cm inferior to the lower border of the occipital protuberance. The second injection site was 1.5 cm superiorly and laterally to the first injection site.  -Trapezius muscle injection was performed at 3 sites, bilaterally. The first injection site was in the upper trapezius muscle halfway between the inflection point of the neck, and the acromion. The second injection site was one half way between the acromion and the first injection site. The third injection was done between the first injection site and the inflection point of the neck.   A 200 unit bottle of Botox was used, 155 units were injected, the rest of the Botox was wasted. The patient tolerated the procedure well, there were no complications of the above procedure.  Botox NDC 0177-9390-30 Lot number [id number] Expiration date 03/2023 B/B

## 2020-07-14 ENCOUNTER — Encounter: Payer: Self-pay | Admitting: Family Medicine

## 2020-07-19 ENCOUNTER — Encounter: Payer: Self-pay | Admitting: Family Medicine

## 2020-07-19 DIAGNOSIS — M25519 Pain in unspecified shoulder: Secondary | ICD-10-CM

## 2020-07-20 ENCOUNTER — Other Ambulatory Visit: Payer: Self-pay | Admitting: Obstetrics and Gynecology

## 2020-07-20 DIAGNOSIS — Z1231 Encounter for screening mammogram for malignant neoplasm of breast: Secondary | ICD-10-CM

## 2020-07-27 NOTE — Telephone Encounter (Signed)
[facility] [address]   or [facility] in [place] or [place]

## 2020-07-27 NOTE — Telephone Encounter (Signed)
Please advise 

## 2020-08-03 DIAGNOSIS — M7502 Adhesive capsulitis of left shoulder: Secondary | ICD-10-CM | POA: Diagnosis not present

## 2020-08-03 DIAGNOSIS — M25512 Pain in left shoulder: Secondary | ICD-10-CM | POA: Insufficient documentation

## 2020-08-04 DIAGNOSIS — M7502 Adhesive capsulitis of left shoulder: Secondary | ICD-10-CM | POA: Diagnosis not present

## 2020-08-04 DIAGNOSIS — M25612 Stiffness of left shoulder, not elsewhere classified: Secondary | ICD-10-CM | POA: Diagnosis not present

## 2020-08-04 DIAGNOSIS — M25512 Pain in left shoulder: Secondary | ICD-10-CM | POA: Diagnosis not present

## 2020-08-13 DIAGNOSIS — M25612 Stiffness of left shoulder, not elsewhere classified: Secondary | ICD-10-CM | POA: Diagnosis not present

## 2020-08-13 DIAGNOSIS — M25512 Pain in left shoulder: Secondary | ICD-10-CM | POA: Diagnosis not present

## 2020-08-16 ENCOUNTER — Telehealth: Payer: Self-pay

## 2020-08-16 NOTE — Telephone Encounter (Signed)
Submitted for Saxsenda via covermymeds.

## 2020-08-17 DIAGNOSIS — G43719 Chronic migraine without aura, intractable, without status migrainosus: Secondary | ICD-10-CM | POA: Diagnosis not present

## 2020-08-20 DIAGNOSIS — M25612 Stiffness of left shoulder, not elsewhere classified: Secondary | ICD-10-CM | POA: Diagnosis not present

## 2020-08-20 DIAGNOSIS — M25512 Pain in left shoulder: Secondary | ICD-10-CM | POA: Diagnosis not present

## 2020-08-26 DIAGNOSIS — M25512 Pain in left shoulder: Secondary | ICD-10-CM | POA: Diagnosis not present

## 2020-08-26 DIAGNOSIS — M25612 Stiffness of left shoulder, not elsewhere classified: Secondary | ICD-10-CM | POA: Diagnosis not present

## 2020-08-31 ENCOUNTER — Ambulatory Visit: Payer: BC Managed Care – PPO | Attending: Internal Medicine

## 2020-08-31 DIAGNOSIS — M25612 Stiffness of left shoulder, not elsewhere classified: Secondary | ICD-10-CM | POA: Diagnosis not present

## 2020-08-31 DIAGNOSIS — Z20822 Contact with and (suspected) exposure to covid-19: Secondary | ICD-10-CM

## 2020-08-31 DIAGNOSIS — M25512 Pain in left shoulder: Secondary | ICD-10-CM | POA: Diagnosis not present

## 2020-09-01 LAB — SARS-COV-2, NAA 2 DAY TAT

## 2020-09-01 LAB — NOVEL CORONAVIRUS, NAA: SARS-CoV-2, NAA: NOT DETECTED

## 2020-09-02 ENCOUNTER — Encounter: Payer: Self-pay | Admitting: Family Medicine

## 2020-09-02 ENCOUNTER — Telehealth (INDEPENDENT_AMBULATORY_CARE_PROVIDER_SITE_OTHER): Payer: BC Managed Care – PPO | Admitting: Family Medicine

## 2020-09-02 ENCOUNTER — Other Ambulatory Visit: Payer: Self-pay

## 2020-09-02 DIAGNOSIS — U099 Post covid-19 condition, unspecified: Secondary | ICD-10-CM | POA: Diagnosis not present

## 2020-09-02 DIAGNOSIS — R053 Chronic cough: Secondary | ICD-10-CM

## 2020-09-02 DIAGNOSIS — J029 Acute pharyngitis, unspecified: Secondary | ICD-10-CM

## 2020-09-02 DIAGNOSIS — R059 Cough, unspecified: Secondary | ICD-10-CM

## 2020-09-02 MED ORDER — AMOXICILLIN-POT CLAVULANATE 875-125 MG PO TABS: 1.0000 | ORAL_TABLET | Freq: Two times a day (BID) | ORAL | 0 refills | Status: DC

## 2020-09-02 MED ORDER — PROMETHAZINE HCL 12.5 MG PO TABS: 12.5000 mg | ORAL_TABLET | Freq: Four times a day (QID) | ORAL | 1 refills | Status: DC | PRN

## 2020-09-02 MED ORDER — LEVOCETIRIZINE DIHYDROCHLORIDE 5 MG PO TABS
5.0000 mg | ORAL_TABLET | Freq: Every evening | ORAL | 5 refills | Status: DC
Start: 2020-09-02 — End: 2020-09-13

## 2020-09-02 NOTE — Progress Notes (Signed)
Morgan Long    MyChart Video Visit    Virtual Visit via Video Note   This visit type was conducted due to national recommendations for restrictions regarding the COVID-19 Pandemic (e.g. social distancing) in an effort to limit this patient's exposure and mitigate transmission in our community. This patient is at least at moderate risk for complications without adequate follow up. This format is felt to be most appropriate for this patient at this time. Physical exam was limited by quality of the video and audio technology used for the visit. Morgan Long was able to get the patient set up on a video visit.  Patient location: home alone Patient and provider in visit Provider location: Office  I discussed the limitations of evaluation and management by telemedicine and the availability of in person appointments. The patient expressed understanding and agreed to proceed.  Visit Date: 09/02/2020  Today's healthcare provider: [name], DO     Subjective:    Patient ID: Morgan Long, female    DOB: 1970/06/12, 50 y.o.   MRN: [id number]  Chief Complaint  Patient presents with  . sinus drainage    X 4 days, some nausea and body aches. Pt says her tougue feels swollen. No tx tried yet.     HPI Patient is in today for 4 day hx congestion , body aches, sore throat, + nausea , no fever   She has taken nausea meds only  Past Medical History:  Diagnosis Date  . Allergic rhinitis   . Asthma   . Chronic migraine    Dr. Catalina Gravel  . Constipation   . Dry eye syndrome of both lacrimal glands   . Dry skin   . Endometriosis   . Fatigue   . Gastroparesis   . GERD (gastroesophageal reflux disease)   . Headache(784.0)    occasional, dx w/ Migraines before, Topamax helps  . Heartburn   . Hepatic steatosis   . History of stomach ulcers   . Lactose intolerance   . Localized edema   . Migraine   . Muscle weakness (generalized)   . Nuclear cataract of both eyes    Mild  .  Overweight   . Pain in right ankle and joints of right foot   . Shortness of breath   . Sinus complaint   . Sinusitis   . Tired   . TMJ pain dysfunction syndrome    occasional    Past Surgical History:  Procedure Laterality Date  . ANKLE SURGERY Left 11/16/2017  . AUGMENTATION MAMMAPLASTY Bilateral 2006  . BREAST ENHANCEMENT SURGERY  2006  . BUNIONECTOMY    . COLONOSCOPY    . endrometroisis    . fallopian tube removed     Left  . 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  . UPPER GASTROINTESTINAL ENDOSCOPY    . wisdoim teeth extraction      Family History  Problem Relation Age of Onset  . Throat cancer Mother   . Cancer - Other Mother 55       throat - died 33 months  . Cancer Mother   . Alcoholism Mother   . Heart disease Father   . Cancer Father   . Breast cancer Maternal Aunt        breast  . Colon cancer Maternal Uncle 78       died 17  . Breast cancer Maternal Aunt        breast  .  Pancreatic cancer Maternal Aunt   . Diabetes Other        grandmother  . Hypertension Maternal Aunt        several family members  . Throat cancer Maternal Uncle   . Asthma Other        cousin, maternal  . Breast cancer Maternal Aunt        total of 5 aunts  . Breast cancer Maternal Aunt   . Colon cancer Paternal Grandfather   . Heart attack Neg Hx   . Rectal cancer Neg Hx   . Stomach cancer Neg Hx     Social History   Socioeconomic History  . Marital status: Single    Spouse name: Not on file  . Number of children: 0  . Years of education: BS  . Highest education level: Not on file  Occupational History  . Occupation: Paramedic, going to school    Employer: [employer]  . Occupation: Air traffic controller  Tobacco Use  . Smoking status: Never Smoker  . Smokeless tobacco: Never Used  Vaping Use  . Vaping Use: Never used  Substance and Sexual Activity  . Alcohol use: Yes    Alcohol/week: 0.0 standard drinks    Comment:  socially - occasional   . Drug use: No  . Sexual activity: Yes    Birth control/protection: Inserts    Comment: nuvaring  Other Topics Concern  . Not on file  Social History Narrative   Household:sister and her 3 kids    Drinks occasional starbucks drink       Social Determinants of Health   Financial Resource Strain: Not on file  Food Insecurity: Not on file  Transportation Needs: Not on file  Physical Activity: Not on file  Stress: Not on file  Social Connections: Not on file  Intimate Partner Violence: Not on file    Outpatient Medications Prior to Visit  Medication Sig Dispense Refill  . albuterol (PROAIR HFA) 108 (90 Base) MCG/ACT inhaler Inhale 2 puffs into the lungs every 6 (six) hours as needed for wheezing or shortness of breath. 18 g 2  . bimatoprost (LATISSE) 0.03 % ophthalmic solution APPLY ALONG UPPER EYELID MARGIN AT BASE OF EYELASHES ONCE A DAY    . cetirizine (ZYRTEC) 10 MG tablet TAKE 1 TABLET BY MOUTH DAILY 30 tablet 5  . ESTROGEL 0.75 MG/1.25 GM (0.06%) topical gel Apply 1 application topically See admin instructions.  0  . etonogestrel-ethinyl estradiol (NUVARING) 0.12-0.015 MG/24HR vaginal ring Place 1 each vaginally every 28 (twenty-eight) days. Insert vaginally and leave in place for 3 consecutive weeks, then remove for 1 week.    Morgan Kitchen ibuprofen (ADVIL) 800 MG tablet Take 1 tablet (800 mg total) by mouth every 6 (six) hours as needed. for pain 30 tablet 3  . Insulin Pen Needle (BD PEN NEEDLE NANO U/F) 32G X 4 MM MISC 1 daily 100 each 0  . linaclotide (LINZESS) 72 MCG capsule Take 1 capsule (72 mcg total) by mouth daily before breakfast. 30 capsule 6  . Liraglutide -Weight Management (SAXENDA) 18 MG/3ML SOPN 3 MG SUBCUTANEOUSLY DAILY 3 mL 2  . Multiple Vitamin (MULTIVITAMIN) LIQD Take 5 mLs by mouth daily.    . ondansetron (ZOFRAN ODT) 4 MG disintegrating tablet 4mg  ODT q4 hours prn nausea/vomit 20 tablet 0  . phentermine 15 MG capsule Take 1 capsule (15 mg  total) by mouth every morning. (Patient taking differently: Take 15 mg by mouth as needed.) 30  capsule 0  . potassium chloride SA (KLOR-CON) 20 MEQ tablet Take 1 tablet (20 mEq total) by mouth daily. 30 tablet 6  . PREVIDENT 5000 SENSITIVE 1.1-5 % PSTE USE BEFORE BED DO NOT RINSE    . promethazine-dextromethorphan (PROMETHAZINE-DM) 6.25-15 MG/5ML syrup TAKE 5MLS BY MOUTH 4 TIMES DAILY AS NEEDED 118 mL 0  . promethazine-dextromethorphan (PROMETHAZINE-DM) 6.25-15 MG/5ML syrup Take 5 mLs by mouth 4 (four) times daily as needed. 118 mL 0  . RESTASIS 0.05 % ophthalmic emulsion PLACE 1 DROP INTO BOTH EYES AS NEEDED. 60 mL 3  . rizatriptan (MAXALT-MLT) 10 MG disintegrating tablet TAKE 1 TABLET (10 MG TOTAL) BY MOUTH AS NEEDED FOR MIGRAINE. MAY REPEAT IN 2 HOURS IF NEEDED 9 tablet 11  . Ubrogepant ([name]) 50 MG TABS Take 50 mg by mouth 2 (two) times daily as needed (1 pill at onset of migraine, may repeat in 2 hours, no more than 2 pills in 24 hours.). 10 tablet 3  . Vitamin D, Ergocalciferol, (DRISDOL) 1.25 MG (50000 UNIT) CAPS capsule TAKE 1 CAPSULE (50,000 UNITS TOTAL) BY MOUTH EVERY 7 (SEVEN) DAYS. 12 capsule 1  . promethazine (PHENERGAN) 12.5 MG tablet Take 1 tablet (12.5 mg total) by mouth every 6 (six) hours as needed for nausea or vomiting. 30 tablet 1   No facility-administered medications prior to visit.    Allergies  Allergen Reactions  . Acetaminophen-Codeine Itching  . Hydrocodone Nausea Only  . Oxycodone Itching  . Codeine Itching and Nausea Only    Review of Systems  Constitutional: Negative for chills, fever and malaise/fatigue.  HENT: Negative for congestion and hearing loss.   Eyes: Negative for discharge.  Respiratory: Negative for cough, sputum production and shortness of breath.   Cardiovascular: Negative for chest pain, palpitations and leg swelling.  Gastrointestinal: Negative for abdominal pain, blood in stool, constipation, diarrhea, heartburn, nausea and vomiting.   Genitourinary: Negative for dysuria, frequency, hematuria and urgency.  Musculoskeletal: Negative for back pain, falls and myalgias.  Skin: Negative for rash.  Neurological: Negative for dizziness, sensory change, loss of consciousness, weakness and headaches.  Endo/Heme/Allergies: Negative for environmental allergies. Does not bruise/bleed easily.  Psychiatric/Behavioral: Negative for depression and suicidal ideas. The patient is not nervous/anxious and does not have insomnia.        Objective:    Physical Exam Nursing note reviewed.  Constitutional:      Appearance: She is well-developed.  HENT:     Head: Normocephalic and atraumatic.  Eyes:     Conjunctiva/sclera: Conjunctivae normal.  Neck:     Thyroid: No thyromegaly.     Vascular: No carotid bruit or JVD.  Cardiovascular:     Rate and Rhythm: Normal rate and regular rhythm.     Heart sounds: Normal heart sounds. No murmur heard.   Pulmonary:     Effort: Pulmonary effort is normal. No respiratory distress.     Breath sounds: Normal breath sounds. No wheezing or rales.  Chest:     Chest wall: No tenderness.  Musculoskeletal:     Cervical back: Normal range of motion and neck supple.  Neurological:     Mental Status: She is alert and oriented to person, place, and time.     There were no vitals taken for this visit. Wt Readings from Last 3 Encounters:  03/31/20 236 lb 8 oz (107.3 kg)  03/03/20 232 lb (105.2 kg)  01/05/20 232 lb 6.4 oz (105.4 kg)    Diabetic Foot Exam - Simple   No  data filed    Lab Results  Component Value Date   WBC 12.0 (H) 12/15/2019   HGB 12.5 12/15/2019   HCT 38.8 12/15/2019   PLT 360 12/15/2019   GLUCOSE 103 (H) 12/15/2019   CHOL 191 05/06/2018   TRIG 93 05/21/2019   HDL 65 05/06/2018   LDLCALC 104 (H) 05/06/2018   ALT 20 12/15/2019   AST 18 12/15/2019   NA 140 12/15/2019   K 4.7 12/15/2019   CL 106 12/15/2019   CREATININE 0.88 12/15/2019   BUN 16 12/15/2019   CO2 25  12/15/2019   TSH 0.922 05/06/2018   INR 1.0 12/15/2019   HGBA1C 5.9 (H) 05/06/2018    Lab Results  Component Value Date   TSH 0.922 05/06/2018   Lab Results  Component Value Date   WBC 12.0 (H) 12/15/2019   HGB 12.5 12/15/2019   HCT 38.8 12/15/2019   MCV 89.0 12/15/2019   PLT 360 12/15/2019   Lab Results  Component Value Date   NA 140 12/15/2019   K 4.7 12/15/2019   CO2 25 12/15/2019   GLUCOSE 103 (H) 12/15/2019   BUN 16 12/15/2019   CREATININE 0.88 12/15/2019   BILITOT 0.3 12/15/2019   ALKPHOS 71 12/15/2019   AST 18 12/15/2019   ALT 20 12/15/2019   PROT 7.1 12/15/2019   ALBUMIN 3.4 (L) 12/15/2019   CALCIUM 8.7 (L) 12/15/2019   ANIONGAP 9 12/15/2019   GFR 93.12 01/27/2019   Lab Results  Component Value Date   CHOL 191 05/06/2018   Lab Results  Component Value Date   HDL 65 05/06/2018   Lab Results  Component Value Date   LDLCALC 104 (H) 05/06/2018   Lab Results  Component Value Date   TRIG 93 05/21/2019   Lab Results  Component Value Date   CHOLHDL 3 05/14/2014   Lab Results  Component Value Date   HGBA1C 5.9 (H) 05/06/2018       Assessment & Plan:   Problem List Items Addressed This Visit   None   Visit Diagnoses    Cough    -  Primary   Relevant Medications   amoxicillin-clavulanate (AUGMENTIN) 875-125 MG tablet   levocetirizine (XYZAL) 5 MG tablet   promethazine (PHENERGAN) 12.5 MG tablet   Other Relevant Orders   DG Chest 2 View   Sore throat       Relevant Medications   amoxicillin-clavulanate (AUGMENTIN) 875-125 MG tablet   levocetirizine (XYZAL) 5 MG tablet   COVID-19 long hauler manifesting chronic cough       Relevant Orders   Ambulatory referral to Pulmonology      I am having Morgan Long start on amoxicillin-clavulanate and levocetirizine. I am also having her maintain her etonogestrel-ethinyl estradiol, multivitamin, Estrogel, rizatriptan, cetirizine, phentermine, albuterol, PreviDent 5000 Sensitive, bimatoprost,  potassium chloride SA, ondansetron, ibuprofen, promethazine-dextromethorphan, Vitamin D (Ergocalciferol), Restasis, [name], BD Pen Needle Nano U/F, linaclotide, promethazine-dextromethorphan, Saxenda, and promethazine.  Meds ordered this encounter  Medications  . amoxicillin-clavulanate (AUGMENTIN) 875-125 MG tablet    Sig: Take 1 tablet by mouth 2 (two) times daily.    Dispense:  20 tablet    Refill:  0  . levocetirizine (XYZAL) 5 MG tablet    Sig: Take 1 tablet (5 mg total) by mouth every evening.    Dispense:  30 tablet    Refill:  5  . promethazine (PHENERGAN) 12.5 MG tablet    Sig: Take 1 tablet (12.5 mg total) by mouth every 6 (  six) hours as needed for nausea or vomiting.    Dispense:  30 tablet    Refill:  1    I discussed the assessment and treatment plan with the patient. The patient was provided an opportunity to ask questions and all were answered. The patient agreed with the plan and demonstrated an understanding of the instructions.   The patient was advised to call back or seek an in-person evaluation if the symptoms worsen or if the condition fails to improve as anticipated.     [name], DO [place] [facility] [phone] (phone) [phone] (fax)  [place]

## 2020-09-03 NOTE — Telephone Encounter (Signed)
She can be seen in sat clinic or if still in a lot of pain -- go to ER or UC

## 2020-09-03 NOTE — Telephone Encounter (Signed)
We did not send that in---- it must have been an old refill the pharmacy automatically refilled

## 2020-09-03 NOTE — Telephone Encounter (Signed)
Ok to write note

## 2020-09-03 NOTE — Telephone Encounter (Signed)
Ok to write work note? 

## 2020-09-03 NOTE — Telephone Encounter (Signed)
Patient was seen for Cough via VV yesterday.

## 2020-09-06 ENCOUNTER — Encounter: Payer: Self-pay | Admitting: Family Medicine

## 2020-09-06 ENCOUNTER — Ambulatory Visit
Admission: RE | Admit: 2020-09-06 | Discharge: 2020-09-06 | Disposition: A | Discharge status: Home / Self Care | Payer: BC Managed Care – PPO | Source: Ambulatory Visit | Attending: Obstetrics and Gynecology | Admitting: Obstetrics and Gynecology

## 2020-09-06 ENCOUNTER — Other Ambulatory Visit: Payer: Self-pay

## 2020-09-06 DIAGNOSIS — Z01419 Encounter for gynecological examination (general) (routine) without abnormal findings: Secondary | ICD-10-CM | POA: Diagnosis not present

## 2020-09-06 DIAGNOSIS — M7502 Adhesive capsulitis of left shoulder: Secondary | ICD-10-CM | POA: Diagnosis not present

## 2020-09-06 DIAGNOSIS — M25512 Pain in left shoulder: Secondary | ICD-10-CM | POA: Diagnosis not present

## 2020-09-06 DIAGNOSIS — Z760 Encounter for issue of repeat prescription: Secondary | ICD-10-CM | POA: Diagnosis not present

## 2020-09-06 DIAGNOSIS — T50905A Adverse effect of unspecified drugs, medicaments and biological substances, initial encounter: Secondary | ICD-10-CM | POA: Diagnosis not present

## 2020-09-06 DIAGNOSIS — Z1231 Encounter for screening mammogram for malignant neoplasm of breast: Secondary | ICD-10-CM | POA: Diagnosis not present

## 2020-09-06 DIAGNOSIS — Z6841 Body Mass Index (BMI) 40.0 and over, adult: Secondary | ICD-10-CM | POA: Diagnosis not present

## 2020-09-06 NOTE — Telephone Encounter (Signed)
yes

## 2020-09-07 NOTE — Telephone Encounter (Signed)
I have received the paperwork for the patient. Could we get her scheduled at her convenience please?  Thanks!

## 2020-09-07 NOTE — Telephone Encounter (Signed)
She can come in for appointment

## 2020-09-08 ENCOUNTER — Other Ambulatory Visit: Payer: Self-pay | Admitting: Obstetrics and Gynecology

## 2020-09-08 ENCOUNTER — Encounter: Payer: Self-pay | Admitting: Family Medicine

## 2020-09-08 ENCOUNTER — Ambulatory Visit: Payer: BC Managed Care – PPO

## 2020-09-08 DIAGNOSIS — R928 Other abnormal and inconclusive findings on diagnostic imaging of breast: Secondary | ICD-10-CM

## 2020-09-10 ENCOUNTER — Other Ambulatory Visit: Payer: Self-pay

## 2020-09-10 ENCOUNTER — Telehealth: Payer: Self-pay | Admitting: Family Medicine

## 2020-09-10 ENCOUNTER — Encounter: Payer: Self-pay | Admitting: Family Medicine

## 2020-09-10 ENCOUNTER — Telehealth (INDEPENDENT_AMBULATORY_CARE_PROVIDER_SITE_OTHER): Payer: BC Managed Care – PPO | Admitting: Family Medicine

## 2020-09-10 DIAGNOSIS — R0602 Shortness of breath: Secondary | ICD-10-CM

## 2020-09-10 DIAGNOSIS — R112 Nausea with vomiting, unspecified: Secondary | ICD-10-CM

## 2020-09-10 MED ORDER — FAMOTIDINE 20 MG PO TABS: 20.0000 mg | ORAL_TABLET | Freq: Two times a day (BID) | ORAL | 1 refills | Status: DC

## 2020-09-10 MED ORDER — ONDANSETRON 4 MG PO TBDP: ORAL_TABLET | ORAL | 0 refills | Status: DC

## 2020-09-10 NOTE — Assessment & Plan Note (Signed)
Improved

## 2020-09-10 NOTE — Progress Notes (Signed)
MyChart Video Visit    Virtual Visit via Video Note   This visit type was conducted due to national recommendations for restrictions regarding the COVID-19 Pandemic (e.g. social distancing) in an effort to limit this patient's exposure and mitigate transmission in our community. This patient is at least at moderate risk for complications without adequate follow up. This format is felt to be most appropriate for this patient at this time. Physical exam was limited by quality of the video and audio technology used for the visit. Morgan Long  was able to get the patient set up on a video visit.  Patient location: Home Patient and provider in visit Provider location: Office  I discussed the limitations of evaluation and management by telemedicine and the availability of in person appointments. The patient expressed understanding and agreed to proceed.  Visit Date: 09/10/2020  Today's healthcare provider: [name], DO     Subjective:    Patient ID: Morgan Long, female    DOB: 04-14-1971, 50 y.o.   MRN: [id number]  Chief Complaint  Patient presents with  . Paperwork    Pt says she is unsure if you need her to get a strep test or not. Her throat is better but is still some sore. This is to return to work on 10/02/20.    HPI Patient is in today for release to work paperwork -- she would like to go back to work on 4/30 but she is still very sob , fatigued and is running low grade fever 99.    She is still having N/V-- occurs several times a week and she ran out of zofran She is waiting on pulm referral for sob    Past Medical History:  Diagnosis Date  . Allergic rhinitis   . Asthma   . Chronic migraine    Dr. Catalina Gravel  . Constipation   . Dry eye syndrome of both lacrimal glands   . Dry skin   . Endometriosis   . Fatigue   . Gastroparesis   . GERD (gastroesophageal reflux disease)   . Headache(784.0)    occasional, dx w/ Migraines before, Topamax helps  .  Heartburn   . Hepatic steatosis   . History of stomach ulcers   . Lactose intolerance   . Localized edema   . Migraine   . Muscle weakness (generalized)   . Nuclear cataract of both eyes    Mild  . Overweight   . Pain in right ankle and joints of right foot   . Shortness of breath   . Sinus complaint   . Sinusitis   . Tired   . TMJ pain dysfunction syndrome    occasional    Past Surgical History:  Procedure Laterality Date  . ANKLE SURGERY Left 11/16/2017  . AUGMENTATION MAMMAPLASTY Bilateral 2006  . BREAST ENHANCEMENT SURGERY  2006  . BUNIONECTOMY    . COLONOSCOPY    . endrometroisis    . fallopian tube removed     Left  . 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  . UPPER GASTROINTESTINAL ENDOSCOPY    . wisdoim teeth extraction      Family History  Problem Relation Age of Onset  . Throat cancer Mother   . Cancer - Other Mother 55       throat - died 64 months  . Cancer Mother   . Alcoholism Mother   . Heart disease Father   .  Cancer Father   . Breast cancer Maternal Aunt        breast  . Colon cancer Maternal Uncle 23       died 64  . Breast cancer Maternal Aunt        breast  . Pancreatic cancer Maternal Aunt   . Diabetes Other        grandmother  . Hypertension Maternal Aunt        several family members  . Throat cancer Maternal Uncle   . Asthma Other        cousin, maternal  . Breast cancer Maternal Aunt        total of 5 aunts  . Breast cancer Maternal Aunt   . Colon cancer Paternal Grandfather   . Heart attack Neg Hx   . Rectal cancer Neg Hx   . Stomach cancer Neg Hx     Social History   Socioeconomic History  . Marital status: Single    Spouse name: Not on file  . Number of children: 0  . Years of education: BS  . Highest education level: Not on file  Occupational History  . Occupation: Paramedic, going to school    Employer: [employer]  . Occupation: Air traffic controller  Tobacco Use  .  Smoking status: Never Smoker  . Smokeless tobacco: Never Used  Vaping Use  . Vaping Use: Never used  Substance and Sexual Activity  . Alcohol use: Yes    Alcohol/week: 0.0 standard drinks    Comment: socially - occasional   . Drug use: No  . Sexual activity: Yes    Birth control/protection: Inserts    Comment: nuvaring  Other Topics Concern  . Not on file  Social History Narrative   Household:sister and her 3 kids    Drinks occasional starbucks drink       Social Determinants of Health   Financial Resource Strain: Not on file  Food Insecurity: Not on file  Transportation Needs: Not on file  Physical Activity: Not on file  Stress: Not on file  Social Connections: Not on file  Intimate Partner Violence: Not on file    Outpatient Medications Prior to Visit  Medication Sig Dispense Refill  . albuterol (PROAIR HFA) 108 (90 Base) MCG/ACT inhaler Inhale 2 puffs into the lungs every 6 (six) hours as needed for wheezing or shortness of breath. 18 g 2  . amoxicillin-clavulanate (AUGMENTIN) 875-125 MG tablet Take 1 tablet by mouth 2 (two) times daily. 20 tablet 0  . bimatoprost (LATISSE) 0.03 % ophthalmic solution APPLY ALONG UPPER EYELID MARGIN AT BASE OF EYELASHES ONCE A DAY    . cetirizine (ZYRTEC) 10 MG tablet TAKE 1 TABLET BY MOUTH DAILY 30 tablet 5  . COVID-19 mRNA vaccine, Pfizer, 30 MCG/0.3ML injection INJECT AS DIRECTED .3 mL 0  . ESTROGEL 0.75 MG/1.25 GM (0.06%) topical gel Apply 1 application topically See admin instructions.  0  . etonogestrel-ethinyl estradiol (NUVARING) 0.12-0.015 MG/24HR vaginal ring Place 1 each vaginally every 28 (twenty-eight) days. Insert vaginally and leave in place for 3 consecutive weeks, then remove for 1 week.    Marland Kitchen ibuprofen (ADVIL) 800 MG tablet Take 1 tablet (800 mg total) by mouth every 6 (six) hours as needed. for pain 30 tablet 3  . influenza vac split quadrivalent PF (FLUARIX) 0.5 ML injection TAKE AS DIRECTED .5 mL 0  . Insulin Pen Needle  (BD PEN NEEDLE NANO U/F) 32G X 4 MM MISC 1 daily  100 each 0  . levocetirizine (XYZAL) 5 MG tablet Take 1 tablet (5 mg total) by mouth every evening. 30 tablet 5  . linaclotide (LINZESS) 72 MCG capsule Take 1 capsule (72 mcg total) by mouth daily before breakfast. 30 capsule 6  . Liraglutide -Weight Management (SAXENDA) 18 MG/3ML SOPN 3 MG SUBCUTANEOUSLY DAILY 3 mL 2  . Multiple Vitamin (MULTIVITAMIN) LIQD Take 5 mLs by mouth daily.    . phentermine 15 MG capsule Take 1 capsule (15 mg total) by mouth every morning. (Patient taking differently: Take 15 mg by mouth as needed.) 30 capsule 0  . potassium chloride SA (KLOR-CON) 20 MEQ tablet Take 1 tablet (20 mEq total) by mouth daily. 30 tablet 6  . PREVIDENT 5000 SENSITIVE 1.1-5 % PSTE USE BEFORE BED DO NOT RINSE    . promethazine (PHENERGAN) 12.5 MG tablet Take 1 tablet (12.5 mg total) by mouth every 6 (six) hours as needed for nausea or vomiting. 30 tablet 1  . promethazine-dextromethorphan (PROMETHAZINE-DM) 6.25-15 MG/5ML syrup TAKE 5MLS BY MOUTH 4 TIMES DAILY AS NEEDED 118 mL 0  . promethazine-dextromethorphan (PROMETHAZINE-DM) 6.25-15 MG/5ML syrup Take 5 mLs by mouth 4 (four) times daily as needed. 118 mL 0  . RESTASIS 0.05 % ophthalmic emulsion PLACE 1 DROP INTO BOTH EYES AS NEEDED. 60 mL 3  . rizatriptan (MAXALT-MLT) 10 MG disintegrating tablet TAKE 1 TABLET (10 MG TOTAL) BY MOUTH AS NEEDED FOR MIGRAINE. MAY REPEAT IN 2 HOURS IF NEEDED 9 tablet 11  . Ubrogepant ([name]) 50 MG TABS Take 50 mg by mouth 2 (two) times daily as needed (1 pill at onset of migraine, may repeat in 2 hours, no more than 2 pills in 24 hours.). 10 tablet 3  . Vitamin D, Ergocalciferol, (DRISDOL) 1.25 MG (50000 UNIT) CAPS capsule TAKE 1 CAPSULE (50,000 UNITS TOTAL) BY MOUTH EVERY 7 (SEVEN) DAYS. 12 capsule 1  . ondansetron (ZOFRAN ODT) 4 MG disintegrating tablet 4mg  ODT q4 hours prn nausea/vomit 20 tablet 0   No facility-administered medications prior to visit.     Allergies  Allergen Reactions  . Acetaminophen-Codeine Itching  . Hydrocodone Nausea Only  . Oxycodone Itching  . Codeine Itching and Nausea Only    Review of Systems  Constitutional: Negative for chills and fever.  HENT: Negative for ear pain, sinus pain and sore throat.   Gastrointestinal: Positive for nausea and vomiting. Negative for abdominal pain, constipation and diarrhea.  Genitourinary: Negative for dysuria, frequency, hematuria and urgency.  Musculoskeletal: Negative for back pain and neck pain.  Neurological: Positive for headaches.  Psychiatric/Behavioral: Negative for depression.       Objective:    Physical Exam Vitals and nursing note reviewed.  Constitutional:      General: She is not in acute distress.    Appearance: She is not ill-appearing.  Pulmonary:     Effort: No respiratory distress.  Neurological:     Mental Status: She is alert.  Psychiatric:        Behavior: Behavior normal.        Thought Content: Thought content normal.     There were no vitals taken for this visit. Wt Readings from Last 3 Encounters:  03/31/20 236 lb 8 oz (107.3 kg)  03/03/20 232 lb (105.2 kg)  01/05/20 232 lb 6.4 oz (105.4 kg)    Diabetic Foot Exam - Simple   No data filed    Lab Results  Component Value Date   WBC 12.0 (H) 12/15/2019   HGB 12.5  12/15/2019   HCT 38.8 12/15/2019   PLT 360 12/15/2019   GLUCOSE 103 (H) 12/15/2019   CHOL 191 05/06/2018   TRIG 93 05/21/2019   HDL 65 05/06/2018   LDLCALC 104 (H) 05/06/2018   ALT 20 12/15/2019   AST 18 12/15/2019   NA 140 12/15/2019   K 4.7 12/15/2019   CL 106 12/15/2019   CREATININE 0.88 12/15/2019   BUN 16 12/15/2019   CO2 25 12/15/2019   TSH 0.922 05/06/2018   INR 1.0 12/15/2019   HGBA1C 5.9 (H) 05/06/2018    Lab Results  Component Value Date   TSH 0.922 05/06/2018   Lab Results  Component Value Date   WBC 12.0 (H) 12/15/2019   HGB 12.5 12/15/2019   HCT 38.8 12/15/2019   MCV 89.0  12/15/2019   PLT 360 12/15/2019   Lab Results  Component Value Date   NA 140 12/15/2019   K 4.7 12/15/2019   CO2 25 12/15/2019   GLUCOSE 103 (H) 12/15/2019   BUN 16 12/15/2019   CREATININE 0.88 12/15/2019   BILITOT 0.3 12/15/2019   ALKPHOS 71 12/15/2019   AST 18 12/15/2019   ALT 20 12/15/2019   PROT 7.1 12/15/2019   ALBUMIN 3.4 (L) 12/15/2019   CALCIUM 8.7 (L) 12/15/2019   ANIONGAP 9 12/15/2019   GFR 93.12 01/27/2019   Lab Results  Component Value Date   CHOL 191 05/06/2018   Lab Results  Component Value Date   HDL 65 05/06/2018   Lab Results  Component Value Date   LDLCALC 104 (H) 05/06/2018   Lab Results  Component Value Date   TRIG 93 05/21/2019   Lab Results  Component Value Date   CHOLHDL 3 05/14/2014   Lab Results  Component Value Date   HGBA1C 5.9 (H) 05/06/2018       Assessment & Plan:   Problem List Items Addressed This Visit      Unprioritized   SOB (shortness of breath)    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 ondansetron (ZOFRAN ODT) 4 MG disintegrating tablet    Sig: 4mg  ODT q4 hours prn nausea/vomit    Dispense:  20 tablet    Refill:  0  . famotidine (PEPCID) 20 MG tablet    Sig: Take 1 tablet (20 mg total) by mouth 2 (two) times daily.    Dispense:  60 tablet    Refill:  1    I discussed the assessment and treatment plan with the patient. The patient was provided an opportunity to ask questions and all were answered. The patient agreed with the plan and demonstrated an understanding of the instructions.   The patient was advised to call back or seek an in-person evaluation if the symptoms worsen or if the  condition fails to improve as anticipated.    [name], DO [place] [facility] [phone] (phone) [phone] (fax)  [place]

## 2020-09-10 NOTE — Assessment & Plan Note (Signed)
Long haul covid Refer to pulmonary

## 2020-09-10 NOTE — Telephone Encounter (Signed)
Patient would like paper work Restaurant manager, fast food to her at   [email or web address]

## 2020-09-11 NOTE — Progress Notes (Signed)
Cardiology Office Note:    Date:  09/13/2020   ID:  Morgan Long, DOB [birth year], MRN [id number]  PCP:  Carollee Herter, Alferd Apa, DO  Cardiologist:  [name] More, MD    Referring MD: Carollee Herter, Alferd Apa, *    ASSESSMENT:    1. PVC (premature ventricular contraction)   2. COVID   3. Shortness of breath    PLAN:    In order of problems listed above:  1. Symptoms remote from COVID-19 infection December 2020 differential diagnosis pulmonary due to lung injury versus cardiac either CAD or arrhythmia.  Further evaluation cardiac CTA which shows morphology of the lungs as well as repeat of her monitor looking for frequency and complexity ventricular arrhythmia.  She may also need full PFTs   Next appointment: 6 weeks   Medication Adjustments/Labs and Tests Ordered: Current medicines are reviewed .  Length with the patient today.  Concerns regarding medicines are outlined above.  Orders Placed This Encounter  Procedures  . CT CORONARY MORPH W/CTA COR W/SCORE W/CA W/CM &/OR WO/CM  . CT CORONARY FRACTIONAL FLOW RESERVE DATA PREP  . CT CORONARY FRACTIONAL FLOW RESERVE FLUID ANALYSIS  . LONG TERM MONITOR (3-14 DAYS)   No orders of the defined types were placed in this encounter.   Chief Complaint  Patient presents with  . Follow-up    Recently more rapid heart rate and shortness of breath with activity    History of Present Illness:    Morgan Long is a 50 y.o. 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  She hasa hx of COVID-19 infection frequent PVCs and ongoing shortness of breath last seen 09/17/2019.  Echocardiogram performed 09/01/2019 showed EF of 60 to 65% and no findings of cardiomyopathy.  Her ZIO monitor showed frequent ventricular ectopy 7.8% burden with no episodes of ventricular tachycardia.  She is seen in the ED in July with right upper quadrant pain after liver biopsy,  CMP showed mild hypocalcemia low albumin otherwise normal potassium 4.7 creatinine 0.88 GFR greater than 60 cc minute white count was elevated 12,000 hemoglobin 12.5.  She was seen by GI in follow-up 01/30/2020 recommended repeat echocardiogram and concerns of hepatic congestion from biopsy.  Echocardiograms performed [facility] 02/23/2020 showing a normal left ventricular ejection fraction normal left ventricular filling pressure normal right ventricular size and function there was no tricuspid regurgitation of the IVC was normal in size with collapse indicating normal right atrial pressure.   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  Compliance with diet, lifestyle and medications: Yes  Initially she still improved but is having more rapid heart rate palpitation exertional shortness of breath in the last few weeks and months. She is concerned about long-term consequences of COVID-19 lung infection. She is also concerned about whether her arrhythmia is sparse. No wheezing edema chest pain or syncope. Past Medical History:  Diagnosis Date  . Allergic rhinitis   . Asthma   . Chronic migraine    Dr. Catalina Gravel  . Constipation   . Dry eye syndrome of both lacrimal glands   . Dry skin   . Endometriosis   . Fatigue   . Gastroparesis   . GERD (gastroesophageal reflux disease)   . Headache(784.0)    occasional, dx w/ Migraines before, Topamax helps  . Heartburn   . Hepatic steatosis   . History  of stomach ulcers   . Lactose intolerance   . Localized edema   . Migraine   . Muscle weakness (generalized)   . Nuclear cataract of both eyes    Mild  . Overweight   . Pain in right ankle and joints of right foot   . Shortness of breath   . Sinus complaint   . Sinusitis   . Tired   . TMJ pain dysfunction syndrome    occasional    Past Surgical History:  Procedure Laterality Date  . ANKLE SURGERY Left  11/16/2017  . AUGMENTATION MAMMAPLASTY Bilateral 2006  . BREAST ENHANCEMENT SURGERY  2006  . BUNIONECTOMY    . COLONOSCOPY    . endrometroisis    . fallopian tube removed     Left  . 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  . UPPER GASTROINTESTINAL ENDOSCOPY    . wisdoim teeth extraction      Current Medications: Current Meds  Medication Sig  . albuterol (PROAIR HFA) 108 (90 Base) MCG/ACT inhaler Inhale 2 puffs into the lungs every 6 (six) hours as needed for wheezing or shortness of breath.  . bimatoprost (LATISSE) 0.03 % ophthalmic solution APPLY ALONG UPPER EYELID MARGIN AT BASE OF EYELASHES ONCE A DAY  . cetirizine (ZYRTEC) 10 MG tablet TAKE 1 TABLET BY MOUTH DAILY  . ESTROGEL 0.75 MG/1.25 GM (0.06%) topical gel Apply 1 application topically daily.  Marland Kitchen etonogestrel-ethinyl estradiol (NUVARING) 0.12-0.015 MG/24HR vaginal ring Place 1 each vaginally every 28 (twenty-eight) days. Insert vaginally and leave in place for 3 consecutive weeks, then remove for 1 week.  Marland Kitchen ibuprofen (ADVIL) 800 MG tablet Take 1 tablet (800 mg total) by mouth every 6 (six) hours as needed. for pain  . linaclotide (LINZESS) 72 MCG capsule Take 1 capsule (72 mcg total) by mouth daily before breakfast.  . Multiple Vitamin (MULTIVITAMIN) LIQD Take 5 mLs by mouth daily.  . ondansetron (ZOFRAN ODT) 4 MG disintegrating tablet 4mg  ODT q4 hours prn nausea/vomit  . PREVIDENT 5000 SENSITIVE 1.1-5 % PSTE USE BEFORE BED DO NOT RINSE  . promethazine (PHENERGAN) 12.5 MG tablet Take 1 tablet (12.5 mg total) by mouth every 6 (six) hours as needed for nausea or vomiting.  . RESTASIS 0.05 % ophthalmic emulsion PLACE 1 DROP INTO BOTH EYES AS NEEDED. (Patient taking differently: Place 1 drop into both eyes daily as needed (dry eyes).)  . Ubrogepant ([name]) 50 MG TABS Take 50 mg by mouth 2 (two) times daily as needed (1 pill at onset of migraine, may repeat in 2 hours, no more than 2 pills in 24  hours.).  Marland Kitchen Vitamin D, Ergocalciferol, (DRISDOL) 1.25 MG (50000 UNIT) CAPS capsule TAKE 1 CAPSULE (50,000 UNITS TOTAL) BY MOUTH EVERY 7 (SEVEN) DAYS.     Allergies:   Acetaminophen-codeine, Hydrocodone, Oxycodone, and Codeine   Social History   Socioeconomic History  . Marital status: Single    Spouse name: Not on file  . Number of children: 0  . Years of education: BS  . Highest education level: Not on file  Occupational History  . Occupation: Paramedic, going to school    Employer: [employer]  . Occupation: Air traffic controller  Tobacco Use  . Smoking status: Never Smoker  . Smokeless tobacco: Never Used  Vaping Use  . Vaping Use: Never used  Substance and Sexual Activity  . Alcohol use: Yes    Alcohol/week: 0.0 standard drinks  Comment: socially - occasional   . Drug use: No  . Sexual activity: Yes    Birth control/protection: Inserts    Comment: nuvaring  Other Topics Concern  . Not on file  Social History Narrative   Household:sister and her 3 kids    Drinks occasional starbucks drink       Social Determinants of Radio broadcast assistant Strain: Not on file  Food Insecurity: Not on file  Transportation Needs: Not on file  Physical Activity: Not on file  Stress: Not on file  Social Connections: Not on file     Family History: The patient's family history includes Alcoholism in her mother; Asthma in an other family member; Breast cancer in her maternal aunt, maternal aunt, maternal aunt, and maternal aunt; Cancer in her father and mother; Cancer - Other (age of onset: 25) in her mother; Colon cancer in her paternal grandfather; Colon cancer (age of onset: 21) in her maternal uncle; Diabetes in an other family member; Heart disease in her father; Hypertension in her maternal aunt; Pancreatic cancer in her maternal aunt; Throat cancer in her maternal uncle and mother. There is no history of Heart attack, Rectal cancer, or Stomach cancer. ROS:    Please see the history of present illness.    All other systems reviewed and are negative.  EKGs/Labs/Other Studies Reviewed:    The following studies were reviewed today:   Recent Labs: 09/17/2019: Magnesium 2.0 12/15/2019: ALT 20; BUN 16; Creatinine, Ser 0.88; Hemoglobin 12.5; Platelets 360; Potassium 4.7; Sodium 140  Recent Lipid Panel    Component Value Date/Time   CHOL 191 05/06/2018 1421   TRIG 93 05/21/2019 2145   HDL 65 05/06/2018 1421   CHOLHDL 3 05/14/2014 1223   VLDL 12.2 05/14/2014 1223   LDLCALC 104 (H) 05/06/2018 1421    Physical Exam:    VS:  BP 114/66 (BP Location: Right Arm, Patient Position: Sitting)   Pulse 96   Ht 5\' 3"  (1.6 m)   Wt 226 lb (102.5 kg)   SpO2 98%   BMI 40.03 kg/m     Wt Readings from Last 3 Encounters:  09/13/20 226 lb (102.5 kg)  03/31/20 236 lb 8 oz (107.3 kg)  03/03/20 232 lb (105.2 kg)     GEN:  Well nourished, well developed in no acute distress HEENT: Normal NECK: No JVD; No carotid bruits LYMPHATICS: No lymphadenopathy CARDIAC: RRR, no murmurs, rubs, gallops RESPIRATORY:  Clear to auscultation without rales, wheezing or rhonchi  ABDOMEN: Soft, non-tender, non-distended MUSCULOSKELETAL:  No edema; No deformity  SKIN: Warm and dry NEUROLOGIC:  Alert and oriented x 3 PSYCHIATRIC:  Normal affect    Signed, [name] More, MD  09/13/2020 11:03 AM    [facility]

## 2020-09-13 ENCOUNTER — Other Ambulatory Visit: Payer: Self-pay

## 2020-09-13 ENCOUNTER — Ambulatory Visit: Payer: BC Managed Care – PPO

## 2020-09-13 ENCOUNTER — Telehealth: Payer: Self-pay | Admitting: Pulmonary Disease

## 2020-09-13 ENCOUNTER — Ambulatory Visit: Payer: BC Managed Care – PPO | Attending: Internal Medicine

## 2020-09-13 ENCOUNTER — Ambulatory Visit (INDEPENDENT_AMBULATORY_CARE_PROVIDER_SITE_OTHER): Payer: BC Managed Care – PPO | Admitting: Cardiology

## 2020-09-13 ENCOUNTER — Encounter: Payer: Self-pay | Admitting: Cardiology

## 2020-09-13 VITALS — BP 114/66 | HR 96 | Ht 63.0 in | Wt 226.0 lb

## 2020-09-13 DIAGNOSIS — I493 Ventricular premature depolarization: Secondary | ICD-10-CM

## 2020-09-13 DIAGNOSIS — R0602 Shortness of breath: Secondary | ICD-10-CM

## 2020-09-13 DIAGNOSIS — U071 COVID-19: Secondary | ICD-10-CM | POA: Insufficient documentation

## 2020-09-13 MED ORDER — METOPROLOL TARTRATE 100 MG PO TABS: 100.0000 mg | ORAL_TABLET | Freq: Once | ORAL | 0 refills | Status: DC

## 2020-09-13 NOTE — Telephone Encounter (Signed)
Called and spoke to patient who stated that since she had COVID in 2020 that she has had shortness of breath but the shortness of breath has increased over the last 3 weeks & has had rapid heart beat with little exertion over the last 3 weeks also. Denies any active chest pain currently.  Dr Elsworth Soho please advise.

## 2020-09-13 NOTE — Telephone Encounter (Signed)
Called and left message on voicemail to please return phone call. Contact number provided. 

## 2020-09-13 NOTE — Telephone Encounter (Signed)
emailed

## 2020-09-13 NOTE — Telephone Encounter (Signed)
Last seen 10/2019. Please schedule office visit with APP/me to reevaluate

## 2020-09-13 NOTE — Patient Instructions (Addendum)
Medication Instructions:  Your physician recommends that you continue on your current medications as directed. Please refer to the Current Medication list given to you today.  *If you need a refill on your cardiac medications before your next appointment, please call your pharmacy*   Lab Work: Your physician recommends that you return for lab work in: Within one week of your cardiac CT  BMP  If you have labs (blood work) drawn today and your tests are completely normal, you will receive your results only by: Marland Kitchen MyChart Message (if you have MyChart) OR . A paper copy in the mail If you have any lab test that is abnormal or we need to change your treatment, we will call you to review the results.   Testing/Procedures: A zio monitor was ordered today. It will remain on for 7 days. You will then return monitor and event diary in provided box. It takes 1-2 weeks for report to be downloaded and returned to [name]. We will call you with the results. If monitor falls off or has orange flashing light, please call Zio for further instructions.   Your cardiac CT will be scheduled at the below location:   [facility] [address] [place], [place] [address] [phone]   If scheduled at [facility], please arrive at the [facility] main entrance (entrance A) of [facility] 30 minutes prior to test start time. Proceed to the [facility] Radiology Department (first floor) to check-in and test prep.  Please follow these instructions carefully (unless otherwise directed):   On the Night Before the Test: . Be sure to Drink plenty of water. . Do not consume any caffeinated/decaffeinated beverages or chocolate 12 hours prior to your test. . Do not take any antihistamines 12 hours prior to your test.  On the Day of the Test: . Drink plenty of water until 1 hour prior to the test. . Do not eat any food 4 hours prior to the test. . You may take your regular medications  prior to the test.  . Take metoprolol (Lopressor) two hours prior to test. . FEMALES- please wear underwire-free bra if availablE       After the Test: . Drink plenty of water. . After receiving IV contrast, you may experience a mild flushed feeling. This is normal. . On occasion, you may experience a mild rash up to 24 hours after the test. This is not dangerous. If this occurs, you can take Benadryl 25 mg and increase your fluid intake. . If you experience trouble breathing, this can be serious. If it is severe call 911 IMMEDIATELY. If it is mild, please call our office. . If you take any of these medications: Glipizide/Metformin, Avandament, Glucavance, please do not take 48 hours after completing test unless otherwise instructed.   Once we have confirmed authorization from your insurance company, we will call you to set up a date and time for your test. Based on how quickly your insurance processes prior authorizations requests, please allow up to 4 weeks to be contacted for scheduling your Cardiac CT appointment. Be advised that routine Cardiac CT appointments could be scheduled as many as 8 weeks after your provider has ordered it.  For non-scheduling related questions, please contact the cardiac imaging nurse navigator should you have any questions/concerns: Marchia Bond, Cardiac Imaging Nurse Navigator Gordy Clement, Cardiac Imaging Nurse Navigator [facility] Direct Office Dial: [phone]   For scheduling needs, including cancellations  and rescheduling, please call [place], [phone].     Follow-Up: At [facility], you and your health needs are our priority.  As part of our continuing mission to provide you with exceptional heart care, we have created designated Provider Care Teams.  These Care Teams include your primary Cardiologist (physician) and Advanced Practice Providers (APPs -  Physician Assistants and Nurse Practitioners) who all work  together to provide you with the care you need, when you need it.  We recommend signing up for the patient portal called "MyChart".  Sign up information is provided on this After Visit Summary.  MyChart is used to connect with patients for Virtual Visits (Telemedicine).  Patients are able to view lab/test results, encounter notes, upcoming appointments, etc.  Non-urgent messages can be sent to your provider as well.   To learn more about what you can do with MyChart, go to [email or web address].    Your next appointment:   6 week(s)  The format for your next appointment:   In Person  Provider:   [name] More, MD   Other Instructions

## 2020-09-13 NOTE — Telephone Encounter (Signed)
Has been faxed.

## 2020-09-14 NOTE — Telephone Encounter (Signed)
Pt returning a phone call. Pt has an appt on 4/18 at 230 with [name]. Nothing further needed

## 2020-09-14 NOTE — Telephone Encounter (Signed)
Called again and left message on voicemail to please return phone call to go over Dr Bari Mantis recommendations. Contact number provided.

## 2020-09-17 ENCOUNTER — Telehealth (HOSPITAL_COMMUNITY): Payer: Self-pay | Admitting: Emergency Medicine

## 2020-09-17 NOTE — Telephone Encounter (Signed)
Attempted to call patient regarding upcoming cardiac CT appointment. °Left message on voicemail with name and callback number °Jimesha Rising RN Navigator Cardiac Imaging °[facility] °[phone] Office °[phone] Cell ° °

## 2020-09-20 ENCOUNTER — Telehealth (HOSPITAL_COMMUNITY): Payer: Self-pay | Admitting: Emergency Medicine

## 2020-09-20 ENCOUNTER — Telehealth (HOSPITAL_COMMUNITY): Payer: Self-pay | Admitting: *Deleted

## 2020-09-20 ENCOUNTER — Ambulatory Visit: Payer: BC Managed Care – PPO | Admitting: Primary Care

## 2020-09-20 DIAGNOSIS — L810 Postinflammatory hyperpigmentation: Secondary | ICD-10-CM | POA: Diagnosis not present

## 2020-09-20 DIAGNOSIS — L74519 Primary focal hyperhidrosis, unspecified: Secondary | ICD-10-CM | POA: Diagnosis not present

## 2020-09-20 NOTE — Telephone Encounter (Signed)
Attempted to call patient regarding upcoming cardiac CT appointment. °Left message on voicemail with name and callback number °Maranatha Grossi RN Navigator Cardiac Imaging °[facility] °[phone] Office °[phone] Cell ° °

## 2020-09-20 NOTE — Progress Notes (Deleted)
@Patient  ID: Morgan Long, female    DOB: 11-17-1970, 50 y.o.   MRN: [id number]  No chief complaint on file.   Referring provider: Carollee Herter, Alferd Apa, *  HPI: 50 year old female, never smoked.  Past medical history significant for moderate persistent asthma.  Patient of Dr. Elsworth Soho, and an office by pulmonary nurse practitioner on 10/08/2019.  09/20/2020 Presents today for overdue follow-up/acute visit.  Patient called our office on 09/13/2020 with reports of shortness of breath and rapid heartbeat with exertion.  She reports having shortness of breath since diagnosed with COVID in 2020.   Patient had COVID-19 pneumonia in December 2020.  Pulmonary function testing in May 2021 normal without evidence of restriction or diffusion defect.  CT chest prior to COVID was normal without signs of fibrosis or sarcoidosis. She follows with cardiology, normal 2D echo  Consider high-resolution CAT scan  Allergies  Allergen Reactions  . Acetaminophen-Codeine Itching  . Hydrocodone Nausea Only  . Oxycodone Itching  . Codeine Itching and Nausea Only    Immunization History  Administered Date(s) Administered  . Hep A / Hep B 05/12/2020, 06/24/2020  . Influenza Split 03/28/2011  . Influenza Whole 05/14/2007  . Influenza,inj,Quad PF,6+ Mos 04/24/2013, 02/27/2014, 02/27/2017, 03/12/2018, 01/23/2019  . PFIZER(Purple Top)SARS-COV-2 Vaccination 09/15/2019, 10/06/2019, 03/12/2020  . Pneumococcal Polysaccharide-23 01/23/2019  . Td 07/08/2007  . Tdap 03/12/2018    Past Medical History:  Diagnosis Date  . Allergic rhinitis   . Asthma   . Chronic migraine    Dr. Catalina Gravel  . Constipation   . Dry eye syndrome of both lacrimal glands   . Dry skin   . Endometriosis   . Fatigue   . Gastroparesis   . GERD (gastroesophageal reflux disease)   . Headache(784.0)    occasional, dx w/ Migraines before, Topamax helps  . Heartburn   . Hepatic steatosis   . History of stomach ulcers   . Lactose  intolerance   . Localized edema   . Migraine   . Muscle weakness (generalized)   . Nuclear cataract of both eyes    Mild  . Overweight   . Pain in right ankle and joints of right foot   . Shortness of breath   . Sinus complaint   . Sinusitis   . Tired   . TMJ pain dysfunction syndrome    occasional    Tobacco History: Social History   Tobacco Use  Smoking Status Never Smoker  Smokeless Tobacco Never Used   Counseling given: Not Answered   Outpatient Medications Prior to Visit  Medication Sig Dispense Refill  . albuterol (PROAIR HFA) 108 (90 Base) MCG/ACT inhaler Inhale 2 puffs into the lungs every 6 (six) hours as needed for wheezing or shortness of breath. 18 g 2  . bimatoprost (LATISSE) 0.03 % ophthalmic solution APPLY ALONG UPPER EYELID MARGIN AT BASE OF EYELASHES ONCE A DAY    . cetirizine (ZYRTEC) 10 MG tablet TAKE 1 TABLET BY MOUTH DAILY 30 tablet 5  . ESTROGEL 0.75 MG/1.25 GM (0.06%) topical gel Apply 1 application topically daily.  0  . etonogestrel-ethinyl estradiol (NUVARING) 0.12-0.015 MG/24HR vaginal ring Place 1 each vaginally every 28 (twenty-eight) days. Insert vaginally and leave in place for 3 consecutive weeks, then remove for 1 week.    Marland Kitchen ibuprofen (ADVIL) 800 MG tablet Take 1 tablet (800 mg total) by mouth every 6 (six) hours as needed. for pain 30 tablet 3  . linaclotide (LINZESS) 72 MCG capsule Take 1 capsule (  72 mcg total) by mouth daily before breakfast. 30 capsule 6  . Liraglutide -Weight Management (SAXENDA) 18 MG/3ML SOPN 3 MG SUBCUTANEOUSLY DAILY (Patient not taking: Reported on 09/13/2020) 3 mL 2  . metoprolol tartrate (LOPRESSOR) 100 MG tablet Take 1 tablet (100 mg total) by mouth once for 1 dose. Take two hours prior to your cardiac CT 1 tablet 0  . Multiple Vitamin (MULTIVITAMIN) LIQD Take 5 mLs by mouth daily.    . ondansetron (ZOFRAN ODT) 4 MG disintegrating tablet 4mg  ODT q4 hours prn nausea/vomit 20 tablet 0  . PREVIDENT 5000 SENSITIVE 1.1-5 %  PSTE USE BEFORE BED DO NOT RINSE    . promethazine (PHENERGAN) 12.5 MG tablet Take 1 tablet (12.5 mg total) by mouth every 6 (six) hours as needed for nausea or vomiting. 30 tablet 1  . RESTASIS 0.05 % ophthalmic emulsion PLACE 1 DROP INTO BOTH EYES AS NEEDED. (Patient taking differently: Place 1 drop into both eyes daily as needed (dry eyes).) 60 mL 3  . Ubrogepant ([name]) 50 MG TABS Take 50 mg by mouth 2 (two) times daily as needed (1 pill at onset of migraine, may repeat in 2 hours, no more than 2 pills in 24 hours.). 10 tablet 3  . Vitamin D, Ergocalciferol, (DRISDOL) 1.25 MG (50000 UNIT) CAPS capsule TAKE 1 CAPSULE (50,000 UNITS TOTAL) BY MOUTH EVERY 7 (SEVEN) DAYS. 12 capsule 1   No facility-administered medications prior to visit.      Review of Systems  Review of Systems   Physical Exam  There were no vitals taken for this visit. Physical Exam   Lab Results:  CBC    Component Value Date/Time   WBC 12.0 (H) 12/15/2019 0733   RBC 4.36 12/15/2019 0733   HGB 12.5 12/15/2019 0733   HGB 12.2 01/23/2018 1155   HCT 38.8 12/15/2019 0733   HCT 37.0 01/23/2018 1155   PLT 360 12/15/2019 0733   MCV 89.0 12/15/2019 0733   MCV 88 01/23/2018 1155   MCH 28.7 12/15/2019 0733   MCHC 32.2 12/15/2019 0733   RDW 13.1 12/15/2019 0733   RDW 15.4 01/23/2018 1155   LYMPHSABS 2.2 12/15/2019 0733   LYMPHSABS 1.9 01/23/2018 1155   MONOABS 1.0 12/15/2019 0733   EOSABS 0.2 12/15/2019 0733   EOSABS 0.1 01/23/2018 1155   BASOSABS 0.1 12/15/2019 0733   BASOSABS 0.0 01/23/2018 1155    BMET    Component Value Date/Time   NA 140 12/15/2019 0733   NA 137 09/17/2019 1557   K 4.7 12/15/2019 0733   CL 106 12/15/2019 0733   CO2 25 12/15/2019 0733   GLUCOSE 103 (H) 12/15/2019 0733   BUN 16 12/15/2019 0733   BUN 14 09/17/2019 1557   CREATININE 0.88 12/15/2019 0733   CALCIUM 8.7 (L) 12/15/2019 0733   GFRNONAA >60 12/15/2019 0733   GFRAA >60 12/15/2019 0733    BNP    Component Value  Date/Time   BNP 23.4 09/04/2019 1403   BNP 38.5 05/21/2019 2101    ProBNP    Component Value Date/Time   PROBNP 86 08/14/2019 1146    Imaging: MM 3D SCREEN BREAST W/IMPLANT BILATERAL  Result Date: 09/07/2020 CLINICAL DATA:  Screening. EXAM: DIGITAL SCREENING BILATERAL MAMMOGRAM WITH IMPLANTS, CAD AND TOMOSYNTHESIS TECHNIQUE: Bilateral screening digital craniocaudal and mediolateral oblique mammograms were obtained. Bilateral screening digital breast tomosynthesis was performed. The images were evaluated with computer-aided detection. Standard and/or implant displaced views were performed. COMPARISON:  Previous exams. ACR Breast Density Category d:  The breast tissue is extremely dense, which lowers the sensitivity of mammography. FINDINGS: The patient has bilateral retropectoral silicone implants. In the right breast, a possible asymmetry warrants further evaluation. In the left breast, no suspicious masses or malignant type calcifications are identified. IMPRESSION: Further evaluation is suggested for possible asymmetry in the right breast. RECOMMENDATION: Diagnostic mammogram and possibly ultrasound of the right breast. (Code:[id number]) The patient will be contacted regarding the findings, and additional imaging will be scheduled. BI-RADS CATEGORY  0: Incomplete. Need additional imaging evaluation and/or prior mammograms for comparison. Electronically Signed   By: Everlean Alstrom M.D.   On: 09/07/2020 10:38     Assessment & Plan:   No problem-specific Assessment & Plan notes found for this encounter.     Martyn Ehrich, NP 09/20/2020

## 2020-09-20 NOTE — Telephone Encounter (Signed)
Pt returning call regarding upcoming cardiac imaging study; pt verbalizes understanding of appt date/time, parking situation and where to check in, pre-test NPO status and medications ordered, and verified current allergies; name and call back number provided for further questions should they arise  Gordy Clement RN Navigator Cardiac Imaging [facility] [phone] office [phone] cell  Pt to take 100mg  metoprolol tartrate 2 hours prior to scan.

## 2020-09-21 ENCOUNTER — Telehealth: Payer: Self-pay

## 2020-09-21 ENCOUNTER — Other Ambulatory Visit: Payer: Self-pay

## 2020-09-21 ENCOUNTER — Ambulatory Visit (HOSPITAL_COMMUNITY)
Admission: RE | Admit: 2020-09-21 | Discharge: 2020-09-21 | Disposition: A | Discharge status: Home / Self Care | Payer: BC Managed Care – PPO | Source: Ambulatory Visit | Attending: Cardiology | Admitting: Cardiology

## 2020-09-21 DIAGNOSIS — I493 Ventricular premature depolarization: Secondary | ICD-10-CM | POA: Insufficient documentation

## 2020-09-21 DIAGNOSIS — U071 COVID-19: Secondary | ICD-10-CM | POA: Insufficient documentation

## 2020-09-21 MED ORDER — NITROGLYCERIN 0.4 MG SL SUBL: 0.8000 mg | SUBLINGUAL_TABLET | Freq: Once | SUBLINGUAL | Status: AC | PRN

## 2020-09-21 MED ORDER — NITROGLYCERIN 0.4 MG SL SUBL
SUBLINGUAL_TABLET | SUBLINGUAL | Status: AC
  Administered 2020-09-21: 0.8 mg via SUBLINGUAL
  Filled 2020-09-21: qty 2

## 2020-09-21 MED ORDER — IOHEXOL 300 MG/ML  SOLN
100.0000 mL | Freq: Once | INTRAMUSCULAR | Status: AC | PRN
  Administered 2020-09-21: 100 mL via INTRAVENOUS

## 2020-09-21 NOTE — Telephone Encounter (Signed)
Spoke with patient regarding results and recommendation.  Patient verbalizes understanding and is agreeable to plan of care. Advised patient to call back with any issues or concerns.  

## 2020-09-21 NOTE — Progress Notes (Unsigned)
Identify Informed Consent   Subject Name: Morgan Long  Subject met inclusion and exclusion criteria.  The informed consent form, study requirements and expectations were reviewed with the subject and questions and concerns were addressed prior to the signing of the consent form.  The subject verbalized understanding of the trial requirements.  The subject agreed to participate in the Identify trial and signed the informed consent at 1220 on 09/21/2020.  The informed consent was obtained prior to performance of any protocol-specific procedures for the subject.  A copy of the signed informed consent was given to the subject and a copy was placed in the subject's medical record.   Nataliya Graig

## 2020-09-21 NOTE — Telephone Encounter (Signed)
-----   Message from Morgan Priest, MD sent at 09/21/2020  3:08 PM EDT ----- [name] results her calcium score is 0 and this is the sum total of atherosclerosis in the coronary arteries and cardiovascular structures are normal.

## 2020-09-30 ENCOUNTER — Other Ambulatory Visit: Payer: BC Managed Care – PPO

## 2020-10-08 ENCOUNTER — Encounter: Payer: Self-pay | Admitting: Family Medicine

## 2020-10-08 NOTE — Telephone Encounter (Signed)
Please advise 

## 2020-10-11 ENCOUNTER — Encounter: Payer: Self-pay | Admitting: Family Medicine

## 2020-10-11 ENCOUNTER — Other Ambulatory Visit: Payer: Self-pay

## 2020-10-11 ENCOUNTER — Encounter: Payer: Self-pay | Admitting: Neurology

## 2020-10-11 ENCOUNTER — Ambulatory Visit (INDEPENDENT_AMBULATORY_CARE_PROVIDER_SITE_OTHER): Payer: BC Managed Care – PPO | Admitting: Neurology

## 2020-10-11 VITALS — BP 122/80

## 2020-10-11 DIAGNOSIS — L219 Seborrheic dermatitis, unspecified: Secondary | ICD-10-CM | POA: Diagnosis not present

## 2020-10-11 DIAGNOSIS — G43719 Chronic migraine without aura, intractable, without status migrainosus: Secondary | ICD-10-CM | POA: Diagnosis not present

## 2020-10-11 DIAGNOSIS — L658 Other specified nonscarring hair loss: Secondary | ICD-10-CM | POA: Diagnosis not present

## 2020-10-11 MED ORDER — ONABOTULINUMTOXINA 100 UNITS IJ SOLR
200.0000 [IU] | Freq: Once | INTRAMUSCULAR | Status: AC
  Administered 2020-10-11: 200 [IU] via INTRAMUSCULAR

## 2020-10-11 NOTE — Patient Instructions (Signed)
Please remember, botulinum toxin takes about 3-7 days to kick in. As discussed, this is not a pain shot. The purpose of the injections is to gradually improve your symptoms. In some patients it takes up to 2-3 weeks to make a difference and it wears off with time. Sometimes it may wear off before it is time for the next injection. We still should wait till the next 3 monthly injection, because injecting too frequently may cause you to develop immunity to the botulinum toxin. We are looking for a reduction in the severity and/or frequency of your symptoms. As a reminder, side effects to look out for are (but not limited to): mouth dryness, dryness of the eyes, heaviness of your head or muscle weakness, including droopy face or droopy eyelid(s), rarely: speech or swallowing difficulties and very rarely: breathing difficulties. Some people have transient neck pain or soreness which typically responds to over-the-counter anti-inflammatory medication and local heat application with a heat pad. If you think you have a severe reaction to the botulinum toxin, such as weakness, trouble speaking, trouble breathing, or trouble swallowing, you have to call 911 or have someone take you to the nearest emergency room. However, most people have either no or minimal side effects from the injections. It is normal to have a little bit of redness and swelling around the injection sites which usually improves after a few hours. Rarely, there may be a bruise that improves on its own. Most side effects reported are very mild and resolve within 10-14 days. Please feel free to call us if you have any additional questions or concerns: [phone] or email us through My Chart. We may have to adjust the dose over time, depending on your results from this injection and your overall response over time to this medication.   

## 2020-10-11 NOTE — Progress Notes (Signed)
[name] is a 50 year old right-handed woman with an underlying medical history of allergic rhinitis, endometriosis, TMJ problems, Reflux disease, gastroparesis, hepatic congestion with steatosis, lactose intolerance, neck pain, and obesity, who presents for her 12th Botox injection for her intractable migraines.  The patient is unaccompanied today.  She had her 10th injection on 03/31/2020, at which time she reported, she had done well.   She had an interim injection with Morgan Long on 07/12/2020.  Today, 10/11/2020: She reports doing well, last injection worked well.  He has reviewed the side effects.  She is on time for her current injection and we have been able to make her next injection prior to leaving today.  She has no new concerns.   Written informed consent for recurrent, 3 monthly intramuscular injections with botulinum toxin for this indication has previously been obtained and scanned into the patient's electronic chart. I will re-consent if the type of botulinum toxin used or the indication for injection changes for this patient in the future. The patient is informed that we will use the same consent for her recurrent, most likely 3 monthly injections. She demonstrated understanding and voiced agreement.   I have previously talked to the patient in detail about expectations, limitations, benefits as well as potential adverse effects of botulinum toxin injections. The patient understands that the side effects include (but are not limited to): Mouth dryness, dryness of eyes, speech and swallowing difficulties, respiratory depression or problems breathing, weakness of muscles including more distant muscles than the ones injected, flu-like symptoms, myalgias, injection site reactions such as redness, itching, swelling, pain, and infection.   200 units of botulinum toxin type A were reconstituted using preservative-free normal saline to a concentration of 10 units per 0.1 mL and drawn up into 1  mL tuberculin syringes. Lot number was [id number], expiration date January 2025 for 1 200 unit vial, buy and bill.    O/E: BP 122/80    The patient was situated in a chair, sitting comfortably. After preparing the areas with 70% isopropyl alcohol and using a 26 gauge 1 1/2 inch hollow lumen recording EMG needle for the neck injections as well as a 30 gauge 1 inch needle for the facial injections, a total dose of 155 units of botulinum toxin type A in the form of Botox was injected into the muscles and the following distribution and quantities:   #1: 10 units on the right and 10 units in the left frontalis muscles, broken down in 2 sites on each side. #2: 5 units in the right and 5 units in the left corrugator muscles. #3: 15 units in the right and 15 units in the left occipitalis muscles, broken down in 3 sites on each side. #4: 20 units in the right and 20 units in the left temporalis muscles, broken down in 4 sites on each side.  #5: 15 units on the right and 15 units in the left upper trapezius muscles, broken down in 3 sites on each side. #6: 10 units in the right and 10 units in the left splenius capitis muscles, broken down in 2 sites on each side.  #7: 2.5 units in the right and 2.5 units in the left procerus muscles.   EMG guidance was utilized for the neck injections with mild EMG activity noted, especially in the splenius capitis muscles bilaterally, right more than left.    A dose of 45 units out of a total dose of 200 units was discarded as unavoidable waste.  The patient tolerated the procedure well without immediate complications. She was advised to make a followup appointment for repeat injections in 3 months from now and encouraged to call us with any interim questions, concerns, problems, or updates. She was in agreement and did not have any questions prior to leaving clinic today.   Previously (copied from previous notes for reference):   She missed an injection in between and  had her ninth injection with Morgan Long, nurse practitioner on 12/29/2019.  She had her 8th injection on 05/08/2019 at which time she reported that she had not taken the [name].  She was unsure if she had any refills.  She presented with a migraine.  She tolerated the Botox injection reasonably well.      I saw her on 01/30/2019 for her seventh injection, at which time she reported that the Botox injections were very effective and that the new medication [name] was more helpful for acute use than her triptan before.   I saw her on 10/24/2018 for her 6th injection, at which time she had noticed a recent flareup of her migraines.  She did not think that the [name] had helped that much.  She was a little overdue for her Botox injection at the time.   I saw her on 07/17/2018 for her fifth injection, at which time she reported Botox had been helpful, she reported overall very good results, no side effects, her migraines had reduced by at least 7 days a month, she also reported reduction by at least 100 hours/month of her migraine headaches.  She did report that she had received cosmetic Botox injection in her eyebrow area.    I saw her on 04/10/2018 for her 4th injection, at which time she reported good results with her Botox injections in significant reduction in her headache frequency and severely overall. She denied any side effects. She was overdue by nearly 2 weeks for her injection at the time.   I saw her on 12/26/2017 for her 3rd injection, at which time she reported having had a recent increase in her headache frequency, she had recent foot surgery.   I saw her on 09/25/2017 for her second round of injections. She felt that in the week prior to the injection her headaches were more intense.    I saw her on 06/28/2017 for her initial Botox injection, at which time she received 155 units of Botox.    She was seen by her primary care physician and received a Toradol injection on 06/19/2017 as well  as a prescription for Relpax.    04/25/17: Morgan Long retired. She reports a long-standing history of migraine headaches, she recalls she was about 50 years old when she started having migraines. For quite some time she had spontaneous improvement in her migraines but some 4 or 5 years ago started having more frequent migraines. She has tried and failed multiple preventative medications but does not have a list with her and prior records from Dr. Audery Amel office are not available today. She reports that his office is closed at this point. She did not get her records from his office. Perhaps her primary care provider has records. I encouraged her to try to obtain those records as it will help [name] manage her migraines optimally. She recalls having tried Topamax. She had hair loss from some medications but is not completely sure which ones. Topamax did not help, Zonegran caused her sedation, nortriptyline was also tried but she is unsure what happened  with it. Amitriptyline she may have tried as well. She did not try Depakote. She is worried about sedation side effects as she also is going to school and has to be alert throughout the day. She takes occasional nausea medicine which likely is Zofran under the tongue. She has tried Maxalt under the tongue which is helpful at times. She needs a refill on that. She has regular eye exams because of her history of dry eyes. She used to wear a single contact but was advised to stop using it.  She has had 2 rounds of Botox injections, last time about 3 months ago as she recalls. She remembers doing okay with it. Prior to starting Botox injections she reports having about 18-20 headache days per month typically. She does not always have a typical aura but sometimes has visual blurriness and bringing in her ears to warn her about an upcoming migraine headache. She has occasional nausea that precedes the headache.   I saw her on 06/09/2015 at which time she was referred is a  new patient referral for a new problem, referred by her optometrist at the time for 1 month history of blurry vision. Her exam at the time was nonfocal and reassuringly she had no significant eye related findings. I suggested further workup in the form of visual evoked potentials, blood work, and she also reported a 6 month history of feeling tired. She had no one-sided weakness, tingling or numbness. Symptoms from the past which included paresthesias had resolved completely. She was under the care of Morgan Long for migraines. She was in the process of titrating Zonegran. She was on 225 mg each night. When she took 300 mg each night she felt too sleepy during the day. She denied any symptoms of sleep disordered breathing. She did admit not being a good sleeper. She did endorse stress as she was in school online for psychology and was also working off-and-on in Personal assistant. She reported not drinking sodas, not drinking alcohol and she reported not smoking. She did report pain with eye movements and dry eyes.  She reported no family history of multiple sclerosis or lupus. She denied joint pain with the exception of bilateral knee pain and she also reported a 50 pound weight gain in the last year. She had visual evoked potentials on 07/06/2015: Impression: The visual evoked response test above was within normal limits bilaterally. No evidence of conduction slowing was seen within the anterior visual pathways on either side on today's evaluation.   We called her with her test results. Labs from 06/09/2015 showed normal A1c, normal vitamin D level, normal ANA, normal RF, normal ESR. CRP was elevated at 15.6. 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   She had a brain MRI with and without contrast on 06/23/2015: IMPRESSION:  This is a normal MRI of the  brain with and without contrast  In addition, personally reviewed the images through the PACS system. We called her with her test results.   I first met her on 05/22/2014 at the request of her neurosurgeon, Dr. Kathyrn Sheriff, at which time the patient reported intermittent right arm numbness, particularly with neck position changes. I suggested blood work and EMG and nerve conduction testing of the right upper extremity. Her blood work showed elevated B12 and B6 levels, indicative of B vitamin supplementation.  Hemoglobin A1c was 5.7. We called her with her test results. She had EMG and nerve conduction testing on 06/02/2014: IMPRESSION:   Nerve conduction studies done on both upper extremities were unremarkable, without evidence of a neuropathy seen. EMG evaluation of the right upper extremity was unremarkable, without evidence of an overlying cervical radiculopathy. We called her with her test results. At the time, she reported improved symptoms.   05/22/2014: She has intermittent right arm numbness. Her symptoms have been going on for about 6 months. She has not noted any permanent numbness and no issues elsewhere. She does not have any significant weakness and sometimes feels weak when the numbness seems to come on. It goes away if she changes positions or adjusts her neck position. She has had right shoulder problems and pain in the right shoulder.   You saw her on 05/14/2014 for neck pain. She had undergone physical therapy without improvement of her neck pain. She had cervical epidural steroid injections which helped for about 24 hours as understand.    She had a C-spine MRI without contrast on 01/07/2014: Mild cervical spondylosis as described above without significant disc protrusion, foraminal stenosis or central canal stenosis.    Blood work from 05/14/2014 was reviewed: She had a BMP, CBC with differential, liver function panel, lipid panel and TSH all of which were fine with the exception of a  borderline LDL of 111.

## 2020-10-12 ENCOUNTER — Telehealth: Payer: Self-pay | Admitting: Family Medicine

## 2020-10-12 NOTE — Telephone Encounter (Signed)
Patient dropped off forms to be filled out by lowne  Placed in lownes folder up front   Patient would like forms to be faxed & then she would like a call to let her know its been done  [phone]

## 2020-10-13 NOTE — Telephone Encounter (Signed)
Form placed in quick sign folder for PCP to review.

## 2020-10-13 NOTE — Telephone Encounter (Signed)
Left detailed message on machine that PCP is out of the office until Monday and that there will be a delay on getting paperwork done.

## 2020-10-14 ENCOUNTER — Telehealth: Payer: Self-pay | Admitting: *Deleted

## 2020-10-14 NOTE — Telephone Encounter (Signed)
Left message for pt to call us back and let us know if she has sent her zio back yet.

## 2020-10-18 DIAGNOSIS — Z0279 Encounter for issue of other medical certificate: Secondary | ICD-10-CM

## 2020-10-18 DIAGNOSIS — R0602 Shortness of breath: Secondary | ICD-10-CM | POA: Insufficient documentation

## 2020-10-18 DIAGNOSIS — M7502 Adhesive capsulitis of left shoulder: Secondary | ICD-10-CM | POA: Diagnosis not present

## 2020-10-18 NOTE — Telephone Encounter (Signed)
Left vm to return call.    

## 2020-10-18 NOTE — Telephone Encounter (Signed)
Forms have been faxed. Copy placed to be scanned and original up front for pt to pick up.

## 2020-10-18 NOTE — Telephone Encounter (Signed)
done

## 2020-10-19 ENCOUNTER — Other Ambulatory Visit: Payer: Self-pay | Admitting: Obstetrics and Gynecology

## 2020-10-19 ENCOUNTER — Ambulatory Visit
Admission: RE | Admit: 2020-10-19 | Discharge: 2020-10-19 | Disposition: A | Discharge status: Home / Self Care | Payer: BC Managed Care – PPO | Source: Ambulatory Visit | Attending: Obstetrics and Gynecology | Admitting: Obstetrics and Gynecology

## 2020-10-19 ENCOUNTER — Ambulatory Visit: Payer: BC Managed Care – PPO

## 2020-10-19 ENCOUNTER — Other Ambulatory Visit: Payer: Self-pay

## 2020-10-19 DIAGNOSIS — R928 Other abnormal and inconclusive findings on diagnostic imaging of breast: Secondary | ICD-10-CM

## 2020-10-19 DIAGNOSIS — R922 Inconclusive mammogram: Secondary | ICD-10-CM | POA: Diagnosis not present

## 2020-10-21 ENCOUNTER — Ambulatory Visit (INDEPENDENT_AMBULATORY_CARE_PROVIDER_SITE_OTHER): Payer: BC Managed Care – PPO | Admitting: Pulmonary Disease

## 2020-10-21 ENCOUNTER — Encounter: Payer: Self-pay | Admitting: Pulmonary Disease

## 2020-10-21 ENCOUNTER — Other Ambulatory Visit: Payer: Self-pay

## 2020-10-21 DIAGNOSIS — J4540 Moderate persistent asthma, uncomplicated: Secondary | ICD-10-CM

## 2020-10-21 DIAGNOSIS — R600 Localized edema: Secondary | ICD-10-CM

## 2020-10-21 DIAGNOSIS — R0602 Shortness of breath: Secondary | ICD-10-CM

## 2020-10-21 MED ORDER — FUROSEMIDE 20 MG PO TABS: 20.0000 mg | ORAL_TABLET | Freq: Every day | ORAL | 0 refills | Status: DC

## 2020-10-21 NOTE — Assessment & Plan Note (Signed)
Her symptoms of chronic shortness of breath predate COVID so difficult to labeled assess post-COVID dyspnea.  She has had detailed evaluation including scans which have been negative for PE or ILD.  Echo has not shown any evidence of pulmonary hypertension. There is an element of deconditioning , but she does not have a particularly sedentary lifestyle.  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  She does not desaturate on ambulation which is reassuring I suggested a graduated exercise program and increasing activity as tolerated

## 2020-10-21 NOTE — Assessment & Plan Note (Signed)
We will trial 20 mg of Lasix for 3 to 5 days to see if this improves her dyspnea.  She does not have any other indication of heart failure

## 2020-10-21 NOTE — Assessment & Plan Note (Signed)
Steroid to take albuterol prior to exercise.  She also has Symbicort but does not take this on a regular basis.  Again I am not convinced that this is related to her asthma

## 2020-10-21 NOTE — Patient Instructions (Signed)
Ambulatory saturation. Prescription for Lasix 20 mg daily for 5 days Decrease salt intake in your diet.  Continue to use albuterol 2 puffs every 6 hours as needed especially prior to exercise Keep up your activity level

## 2020-10-21 NOTE — Progress Notes (Signed)
   Subjective:    Patient ID: Morgan Long, female    DOB: Jul 16, 1970, 50 y.o.   MRN: [id number]  HPI  50 year old never smoker for follow-up of persistent shortness of breath. Initial office visit 03/2019 DOE for 2 years History of COVID-pneumonia 05/2019  She has been seen by cardiology.  A 2D echo showed normal EF.  Normal right ventricular systolic function and pulmonary artery pressures.  D-dimer was negative.  Patient had a 3-day Zio patch -no acute findings.   She has undergone detailed pulmonary evaluation including 2 CT scans.  She had a CT coronaries done 09/2020 which we reviewed.  She has received minimal benefit from albuterol.  She was tried on triple inhaler such as Breztri with minimal benefit. She complains of shortness of breath on exertion, no wheezing, she has occasional mild pedal edema which is unexplained.  She admits to a 40 pound weight gain over the last 2 years.  She had bilateral ankle injury and had surgery on her left foot.  She was also found to have liver lesions and underwent partial liver resection for benign adenoma  Significant tests/ events reviewed CT coronaries 09/2020 resolved infiltrates CT chest November 2020 showed no acute process and clear lungs.  CT chest 05/2019 was negative for PE.,  Multifocal areas of nodular airspace opacity with surrounding groundglass consistent with COVID-19 pneumonia  PFTs 10/2019 >>normal  Ambulatory saturation 10/2020 -saturation stayed at 96%, heart rate increased from 86-111 Spirometry 06/2015-ratio 95, FEV1 108% FVC 95% Spirometry 04/2017-ratio 87, FEV1 84%, FVC 78%-mild restriction  Spirometry 01/2019-ratio 83, FEV1 93%, FVC 90%  Review of Systems neg for any significant sore throat, dysphagia, itching, sneezing, nasal congestion or excess/ purulent secretions, fever, chills, sweats, unintended wt loss, pleuritic or exertional cp, hempoptysis, orthopnea pnd or change in chronic leg swelling. Also denies  presyncope, palpitations, heartburn, abdominal pain, nausea, vomiting, diarrhea or change in bowel or urinary habits, dysuria,hematuria, rash, arthralgias, visual complaints, headache, numbness weakness or ataxia.     Objective:   Physical Exam  Gen. Pleasant, obese, in no distress ENT - no lesions, no post nasal drip Neck: No JVD, no thyromegaly, no carotid bruits Lungs: no use of accessory muscles, no dullness to percussion, decreased without rales or rhonchi  Cardiovascular: Rhythm regular, heart sounds  normal, no murmurs or gallops, 1+ peripheral edema Musculoskeletal: No deformities, no cyanosis or clubbing , no tremors       Assessment & Plan:

## 2020-10-26 ENCOUNTER — Ambulatory Visit: Payer: BC Managed Care – PPO

## 2020-10-26 ENCOUNTER — Telehealth: Payer: Self-pay

## 2020-10-26 NOTE — Telephone Encounter (Signed)
done

## 2020-10-26 NOTE — Telephone Encounter (Signed)
We received a fax yesterday (10/26/2020) from [facility] saying that on 10/22/2020 they they received the forms we sent to extend th pts leave through 12/26/2020. They sent more forms to be completed.

## 2020-10-26 NOTE — Telephone Encounter (Signed)
Form has been faxed.

## 2020-10-27 ENCOUNTER — Encounter: Payer: Self-pay | Admitting: Family Medicine

## 2020-11-02 ENCOUNTER — Encounter: Payer: Self-pay | Admitting: Family Medicine

## 2020-11-02 ENCOUNTER — Ambulatory Visit (INDEPENDENT_AMBULATORY_CARE_PROVIDER_SITE_OTHER): Payer: BC Managed Care – PPO | Admitting: Family Medicine

## 2020-11-02 ENCOUNTER — Other Ambulatory Visit: Payer: Self-pay

## 2020-11-02 VITALS — BP 100/80 | HR 89 | Temp 99.0°F | Resp 18 | Ht 63.0 in | Wt 218.0 lb

## 2020-11-02 DIAGNOSIS — Z0000 Encounter for general adult medical examination without abnormal findings: Secondary | ICD-10-CM

## 2020-11-02 DIAGNOSIS — R059 Cough, unspecified: Secondary | ICD-10-CM

## 2020-11-02 DIAGNOSIS — R110 Nausea: Secondary | ICD-10-CM

## 2020-11-02 DIAGNOSIS — R5383 Other fatigue: Secondary | ICD-10-CM | POA: Diagnosis not present

## 2020-11-02 MED ORDER — PROMETHAZINE-DM 6.25-15 MG/5ML PO SYRP: 5.0000 mL | ORAL_SOLUTION | Freq: Four times a day (QID) | ORAL | 0 refills | Status: DC | PRN

## 2020-11-02 MED ORDER — ONDANSETRON HCL 4 MG PO TABS: 4.0000 mg | ORAL_TABLET | Freq: Three times a day (TID) | ORAL | 0 refills | Status: DC | PRN

## 2020-11-02 NOTE — Progress Notes (Signed)
Subjective:   By signing my name below, I, [name], attest that this documentation has been prepared under the direction and in the presence of Dr. Roma Schanz, DO. 11/02/2020      Patient ID: Morgan Long, female    DOB: 02-May-1971, 50 y.o.   MRN: [id number]  Chief Complaint  Patient presents with  . Fatigue    HPI Patient is in today for a office visit.  She complains of having fatigue for the past 2 weeks. She also notes having nausea, shortness of breath, and cough at this time. Her migraines have also worsened recently. She has intentionally lost weight recently but also noticed a unnatural decrease in appetite. She is managing her protein intake well. Her symptoms started after she recovered from Covid-19. Her taste has not diminished. She is requesting for promethazine syrup. She is also requesting a refill for 4 mg Zofran PO.    Past Medical History:  Diagnosis Date  . Allergic rhinitis   . Asthma   . Chronic migraine    Dr. Catalina Gravel  . Constipation   . Dry eye syndrome of both lacrimal glands   . Dry skin   . Endometriosis   . Fatigue   . Gastroparesis   . GERD (gastroesophageal reflux disease)   . Headache(784.0)    occasional, dx w/ Migraines before, Topamax helps  . Heartburn   . Hepatic steatosis   . History of stomach ulcers   . Lactose intolerance   . Localized edema   . Migraine   . Muscle weakness (generalized)   . Nuclear cataract of both eyes    Mild  . Overweight   . Pain in right ankle and joints of right foot   . Shortness of breath   . Sinus complaint   . Sinusitis   . Tired   . TMJ pain dysfunction syndrome    occasional    Past Surgical History:  Procedure Laterality Date  . ANKLE SURGERY Left 11/16/2017  . AUGMENTATION MAMMAPLASTY Bilateral 2006  . BREAST ENHANCEMENT SURGERY  2006  . BUNIONECTOMY    . COLONOSCOPY    . endrometroisis    . fallopian tube removed     Left  . LAPAROSCOPIC PARTIAL HEPATECTOMY     [facility] --- due to hepatic tumor  . UPPER GASTROINTESTINAL ENDOSCOPY    . [place] teeth extraction      Family History  Problem Relation Age of Onset  . Throat cancer Mother   . Cancer - Other Mother 55       throat - died 33 months  . Cancer Mother   . Alcoholism Mother   . Heart disease Father   . Cancer Father   . Breast cancer Maternal Aunt        breast  . Colon cancer Maternal Uncle 19       died 51  . Breast cancer Maternal Aunt        breast  . Pancreatic cancer Maternal Aunt   . Diabetes Other        grandmother  . Hypertension Maternal Aunt        several family members  . Throat cancer Maternal Uncle   . Asthma Other        cousin, maternal  . Breast cancer Maternal Aunt        total of 5 aunts  . Breast cancer Maternal Aunt   . Colon cancer Paternal Grandfather   . Heart attack  Neg Hx   . Rectal cancer Neg Hx   . Stomach cancer Neg Hx     Social History   Socioeconomic History  . Marital status: Single    Spouse name: Not on file  . Number of children: 0  . Years of education: BS  . Highest education level: Not on file  Occupational History  . Occupation: Paramedic, going to school    Employer: [employer]  . Occupation: Air traffic controller  Tobacco Use  . Smoking status: Never Smoker  . Smokeless tobacco: Never Used  Vaping Use  . Vaping Use: Never used  Substance and Sexual Activity  . Alcohol use: Yes    Alcohol/week: 0.0 standard drinks    Comment: socially - occasional   . Drug use: No  . Sexual activity: Yes    Birth control/protection: Inserts    Comment: nuvaring  Other Topics Concern  . Not on file  Social History Narrative   Household:sister and her 3 kids    Drinks occasional starbucks drink       Social Determinants of Health   Financial Resource Strain: Not on file  Food Insecurity: Not on file  Transportation Needs: Not on file  Physical Activity: Not on file  Stress: Not on file  Social Connections: Not on  file  Intimate Partner Violence: Not on file    Outpatient Medications Prior to Visit  Medication Sig Dispense Refill  . albuterol (PROAIR HFA) 108 (90 Base) MCG/ACT inhaler Inhale 2 puffs into the lungs every 6 (six) hours as needed for wheezing or shortness of breath. 18 g 2  . bimatoprost (LATISSE) 0.03 % ophthalmic solution APPLY ALONG UPPER EYELID MARGIN AT BASE OF EYELASHES ONCE A DAY    . cetirizine (ZYRTEC) 10 MG tablet TAKE 1 TABLET BY MOUTH DAILY 30 tablet 5  . ESTROGEL 0.75 MG/1.25 GM (0.06%) topical gel Apply 1 application topically daily.  0  . etonogestrel-ethinyl estradiol (NUVARING) 0.12-0.015 MG/24HR vaginal ring Place 1 each vaginally every 28 (twenty-eight) days. Insert vaginally and leave in place for 3 consecutive weeks, then remove for 1 week.    . furosemide (LASIX) 20 MG tablet Take 1 tablet (20 mg total) by mouth daily. 5 tablet 0  . ibuprofen (ADVIL) 800 MG tablet Take 1 tablet (800 mg total) by mouth every 6 (six) hours as needed. for pain 30 tablet 3  . linaclotide (LINZESS) 72 MCG capsule Take 1 capsule (72 mcg total) by mouth daily before breakfast. 30 capsule 6  . Liraglutide -Weight Management (SAXENDA) 18 MG/3ML SOPN 3 MG SUBCUTANEOUSLY DAILY 3 mL 2  . Multiple Vitamin (MULTIVITAMIN) LIQD Take 5 mLs by mouth daily.    . ondansetron (ZOFRAN ODT) 4 MG disintegrating tablet 4mg  ODT q4 hours prn nausea/vomit 20 tablet 0  . PREVIDENT 5000 SENSITIVE 1.1-5 % PSTE USE BEFORE BED DO NOT RINSE    . RESTASIS 0.05 % ophthalmic emulsion PLACE 1 DROP INTO BOTH EYES AS NEEDED. (Patient taking differently: Place 1 drop into both eyes daily as needed (dry eyes).) 60 mL 3  . Ubrogepant ([name]) 50 MG TABS Take 50 mg by mouth 2 (two) times daily as needed (1 pill at onset of migraine, may repeat in 2 hours, no more than 2 pills in 24 hours.). 10 tablet 3  . Vitamin D, Ergocalciferol, (DRISDOL) 1.25 MG (50000 UNIT) CAPS capsule TAKE 1 CAPSULE (50,000 UNITS TOTAL) BY MOUTH EVERY 7  (SEVEN) DAYS. 12 capsule 1  .  promethazine (PHENERGAN) 12.5 MG tablet Take 1 tablet (12.5 mg total) by mouth every 6 (six) hours as needed for nausea or vomiting. 30 tablet 1   No facility-administered medications prior to visit.    Allergies  Allergen Reactions  . Acetaminophen-Codeine Itching  . Hydrocodone Nausea Only  . Oxycodone Itching  . Codeine Itching and Nausea Only    Review of Systems  Constitutional: Positive for malaise/fatigue and weight loss. Negative for fever.  HENT: Negative for congestion.   Eyes: Negative for blurred vision.  Respiratory: Positive for cough and shortness of breath.   Cardiovascular: Negative for chest pain, palpitations and leg swelling.  Gastrointestinal: Positive for nausea. Negative for abdominal pain and blood in stool.  Genitourinary: Negative for dysuria and frequency.  Musculoskeletal: Negative for falls.  Skin: Negative for rash.  Neurological: Positive for headaches (Migraines). Negative for dizziness and loss of consciousness.  Endo/Heme/Allergies: Negative for environmental allergies.  Psychiatric/Behavioral: Negative for depression. The patient is not nervous/anxious.        Objective:    Physical Exam Vitals and nursing note reviewed.  Constitutional:      General: She is not in acute distress.    Appearance: Normal appearance. She is well-developed. She is not ill-appearing.  HENT:     Head: Normocephalic and atraumatic.     Right Ear: External ear normal.     Left Ear: External ear normal.  Eyes:     Extraocular Movements: Extraocular movements intact.     Conjunctiva/sclera: Conjunctivae normal.     Pupils: Pupils are equal, round, and reactive to light.  Neck:     Thyroid: No thyromegaly.     Vascular: No carotid bruit or JVD.  Cardiovascular:     Rate and Rhythm: Normal rate and regular rhythm.     Pulses: Normal pulses.     Heart sounds: Normal heart sounds. No murmur heard. No gallop.   Pulmonary:      Effort: Pulmonary effort is normal. No respiratory distress.     Breath sounds: Normal breath sounds. No wheezing, rhonchi or rales.  Chest:     Chest wall: No tenderness.  Musculoskeletal:     Cervical back: Normal range of motion and neck supple.  Skin:    General: Skin is warm and dry.  Neurological:     Mental Status: She is alert and oriented to person, place, and time.  Psychiatric:        Behavior: Behavior normal.     BP 100/80 (BP Location: Right Arm, Patient Position: Sitting, Cuff Size: Large)   Pulse 89   Temp 99 F (37.2 C) (Oral)   Resp 18   Ht 5\' 3"  (1.6 m)   Wt 218 lb (98.9 kg)   SpO2 96%   BMI 38.62 kg/m  Wt Readings from Last 3 Encounters:  11/02/20 218 lb (98.9 kg)  10/21/20 221 lb 9.6 oz (100.5 kg)  09/13/20 226 lb (102.5 kg)    Diabetic Foot Exam - Simple   No data filed    Lab Results  Component Value Date   WBC 9.4 11/02/2020   HGB 12.7 11/02/2020   HCT 39.1 11/02/2020   PLT 347.0 11/02/2020   GLUCOSE 80 11/02/2020   CHOL 191 05/06/2018   TRIG 93 05/21/2019   HDL 65 05/06/2018   LDLCALC 104 (H) 05/06/2018   ALT 15 11/02/2020   AST 15 11/02/2020   NA 138 11/02/2020   K 4.4 11/02/2020   CL 105 11/02/2020  CREATININE 0.77 11/02/2020   BUN 20 11/02/2020   CO2 25 11/02/2020   TSH 0.98 11/02/2020   INR 1.0 12/15/2019   HGBA1C 5.9 (H) 05/06/2018    Lab Results  Component Value Date   TSH 0.98 11/02/2020   Lab Results  Component Value Date   WBC 9.4 11/02/2020   HGB 12.7 11/02/2020   HCT 39.1 11/02/2020   MCV 90.0 11/02/2020   PLT 347.0 11/02/2020   Lab Results  Component Value Date   NA 138 11/02/2020   K 4.4 11/02/2020   CO2 25 11/02/2020   GLUCOSE 80 11/02/2020   BUN 20 11/02/2020   CREATININE 0.77 11/02/2020   BILITOT 0.4 11/02/2020   ALKPHOS 69 11/02/2020   AST 15 11/02/2020   ALT 15 11/02/2020   PROT 6.7 11/02/2020   ALBUMIN 3.9 11/02/2020   CALCIUM 8.9 11/02/2020   ANIONGAP 9 12/15/2019   GFR 88.95  11/02/2020   Lab Results  Component Value Date   CHOL 191 05/06/2018   Lab Results  Component Value Date   HDL 65 05/06/2018   Lab Results  Component Value Date   LDLCALC 104 (H) 05/06/2018   Lab Results  Component Value Date   TRIG 93 05/21/2019   Lab Results  Component Value Date   CHOLHDL 3 05/14/2014   Lab Results  Component Value Date   HGBA1C 5.9 (H) 05/06/2018       Assessment & Plan:   Problem List Items Addressed This Visit      Unprioritized   Other fatigue - Primary    Check labs  Most likely from covid But will check labs  con't vitamins       Relevant Orders   CBC with Differential/Platelet (Completed)   TSH (Completed)   Comprehensive metabolic panel (Completed)   Vitamin B12 (Completed)   Vitamin D (25 hydroxy) (Completed)    Other Visit Diagnoses    Morbid obesity ([place])       Relevant Orders   Amb Ref to Medical Weight Management   Cough in adult       Relevant Medications   promethazine-dextromethorphan (PROMETHAZINE-DM) 6.25-15 MG/5ML syrup   Nausea       Relevant Medications   ondansetron (ZOFRAN) 4 MG tablet       Meds ordered this encounter  Medications  . promethazine-dextromethorphan (PROMETHAZINE-DM) 6.25-15 MG/5ML syrup    Sig: Take 5 mLs by mouth 4 (four) times daily as needed.    Dispense:  118 mL    Refill:  0  . ondansetron (ZOFRAN) 4 MG tablet    Sig: Take 1 tablet (4 mg total) by mouth every 8 (eight) hours as needed for nausea or vomiting.    Dispense:  20 tablet    Refill:  0    I, Dr. Roma Schanz, DO, personally preformed the services described in this documentation.  All medical record entries made by the scribe were at my direction and in my presence.  I have reviewed the chart and discharge instructions (if applicable) and agree that the record reflects my personal performance and is accurate and complete. 11/02/2020   I,[name],acting as a scribe for [name], DO.,have  documented all relevant documentation on the behalf of [name], DO,as directed by  [name], DO while in the presence of [name], DO.   [name], DO

## 2020-11-02 NOTE — Patient Instructions (Signed)

## 2020-11-03 LAB — CBC WITH DIFFERENTIAL/PLATELET
Basophils Absolute: 0.1 10*3/uL (ref 0.0–0.1)
Basophils Relative: 1.4 % (ref 0.0–3.0)
Eosinophils Absolute: 0.1 10*3/uL (ref 0.0–0.7)
Eosinophils Relative: 0.8 % (ref 0.0–5.0)
HCT: 39.1 % (ref 36.0–46.0)
Hemoglobin: 12.7 g/dL (ref 12.0–15.0)
Lymphocytes Relative: 25.3 % (ref 12.0–46.0)
Lymphs Abs: 2.4 10*3/uL (ref 0.7–4.0)
MCHC: 32.6 g/dL (ref 30.0–36.0)
MCV: 90 fl (ref 78.0–100.0)
Monocytes Absolute: 0.6 10*3/uL (ref 0.1–1.0)
Monocytes Relative: 6.9 % (ref 3.0–12.0)
Neutro Abs: 6.2 10*3/uL (ref 1.4–7.7)
Neutrophils Relative %: 65.6 % (ref 43.0–77.0)
Platelets: 347 10*3/uL (ref 150.0–400.0)
RBC: 4.35 Mil/uL (ref 3.87–5.11)
RDW: 13.4 % (ref 11.5–15.5)
WBC: 9.4 10*3/uL (ref 4.0–10.5)

## 2020-11-03 LAB — COMPREHENSIVE METABOLIC PANEL
ALT: 15 U/L (ref 0–35)
AST: 15 U/L (ref 0–37)
Albumin: 3.9 g/dL (ref 3.5–5.2)
Alkaline Phosphatase: 69 U/L (ref 39–117)
BUN: 20 mg/dL (ref 6–23)
CO2: 25 mEq/L (ref 19–32)
Calcium: 8.9 mg/dL (ref 8.4–10.5)
Chloride: 105 mEq/L (ref 96–112)
Creatinine, Ser: 0.77 mg/dL (ref 0.40–1.20)
GFR: 88.95 mL/min (ref >60.00)
Glucose, Bld: 80 mg/dL (ref 70–99)
Potassium: 4.4 mEq/L (ref 3.5–5.1)
Sodium: 138 mEq/L (ref 135–145)
Total Bilirubin: 0.4 mg/dL (ref 0.2–1.2)
Total Protein: 6.7 g/dL (ref 6.0–8.3)

## 2020-11-03 LAB — VITAMIN D 25 HYDROXY (VIT D DEFICIENCY, FRACTURES): VITD: 35.92 ng/mL (ref 30.00–100.00)

## 2020-11-03 LAB — VITAMIN B12: Vitamin B-12: 257 pg/mL (ref 211–911)

## 2020-11-03 LAB — TSH: TSH: 0.98 u[IU]/mL (ref 0.35–4.50)

## 2020-11-04 DIAGNOSIS — Z0000 Encounter for general adult medical examination without abnormal findings: Secondary | ICD-10-CM | POA: Insufficient documentation

## 2020-11-04 NOTE — Assessment & Plan Note (Signed)
Check labs  Most likely from covid But will check labs  con't vitamins

## 2020-11-05 ENCOUNTER — Encounter: Payer: Self-pay | Admitting: Family Medicine

## 2020-11-08 ENCOUNTER — Encounter: Payer: Self-pay | Admitting: Family Medicine

## 2020-11-09 ENCOUNTER — Ambulatory Visit (INDEPENDENT_AMBULATORY_CARE_PROVIDER_SITE_OTHER): Payer: BC Managed Care – PPO | Admitting: Family Medicine

## 2020-11-09 ENCOUNTER — Encounter: Payer: Self-pay | Admitting: Family Medicine

## 2020-11-09 ENCOUNTER — Other Ambulatory Visit: Payer: Self-pay

## 2020-11-09 VITALS — BP 110/68 | HR 92 | Temp 98.9°F | Resp 12 | Ht 63.0 in | Wt 215.6 lb

## 2020-11-09 DIAGNOSIS — E539 Vitamin B deficiency, unspecified: Secondary | ICD-10-CM

## 2020-11-09 DIAGNOSIS — S60459A Superficial foreign body of unspecified finger, initial encounter: Secondary | ICD-10-CM

## 2020-11-09 DIAGNOSIS — S60456A Superficial foreign body of right little finger, initial encounter: Secondary | ICD-10-CM

## 2020-11-09 DIAGNOSIS — R112 Nausea with vomiting, unspecified: Secondary | ICD-10-CM

## 2020-11-09 DIAGNOSIS — E559 Vitamin D deficiency, unspecified: Secondary | ICD-10-CM

## 2020-11-09 MED ORDER — CYANOCOBALAMIN 1000 MCG/ML IJ SOLN
1000.0000 ug | Freq: Once | INTRAMUSCULAR | Status: AC
Start: 2020-11-09 — End: 2020-11-09
  Administered 2020-11-09: 1000 ug via INTRAMUSCULAR

## 2020-11-09 MED ORDER — ONDANSETRON 4 MG PO TBDP: ORAL_TABLET | ORAL | 0 refills | Status: DC

## 2020-11-09 MED ORDER — VITAMIN D (ERGOCALCIFEROL) 1.25 MG (50000 UNIT) PO CAPS: 50000.0000 [IU] | ORAL_CAPSULE | ORAL | 1 refills | Status: DC

## 2020-11-09 NOTE — Progress Notes (Signed)
Subjective:   By signing my name below, I, [name], attest that this documentation has been prepared under the direction and in the presence of Dr. Roma Schanz, DO. 11/09/2020      Patient ID: Morgan Long, female    DOB: 1971-01-09, 50 y.o.   MRN: [id number]  Chief Complaint  Patient presents with  . splinter-right pinky finger  . B12 Injection    HPI Patient is in today for a office visit. She complains of a painful splinter in her finger for the past 2 weeks. She does not know what it is made of. It is exacerbated from typing or washing her hand. She has tried to cu around the area to remove it but could not remove it.  She is getting the B12 injection during this visit. She wanted to go over her vitamin recommendation from her previous lab result.     Past Medical History:  Diagnosis Date  . Allergic rhinitis   . Asthma   . Chronic migraine    Dr. Catalina Gravel  . Constipation   . Dry eye syndrome of both lacrimal glands   . Dry skin   . Endometriosis   . Fatigue   . Gastroparesis   . GERD (gastroesophageal reflux disease)   . Headache(784.0)    occasional, dx w/ Migraines before, Topamax helps  . Heartburn   . Hepatic steatosis   . History of stomach ulcers   . Lactose intolerance   . Localized edema   . Migraine   . Muscle weakness (generalized)   . Nuclear cataract of both eyes    Mild  . Overweight   . Pain in right ankle and joints of right foot   . Shortness of breath   . Sinus complaint   . Sinusitis   . Tired   . TMJ pain dysfunction syndrome    occasional    Past Surgical History:  Procedure Laterality Date  . ANKLE SURGERY Left 11/16/2017  . AUGMENTATION MAMMAPLASTY Bilateral 2006  . BREAST ENHANCEMENT SURGERY  2006  . BUNIONECTOMY    . COLONOSCOPY    . endrometroisis    . fallopian tube removed     Left  . 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  . UPPER GASTROINTESTINAL ENDOSCOPY    .  [place] teeth extraction      Family History  Problem Relation Age of Onset  . Throat cancer Mother   . Cancer - Other Mother 55       throat - died 64 months  . Cancer Mother   . Alcoholism Mother   . Heart disease Father   . Cancer Father   . Breast cancer Maternal Aunt        breast  . Colon cancer Maternal Uncle 59       died 39  . Breast cancer Maternal Aunt        breast  . Pancreatic cancer Maternal Aunt   . Diabetes Other        grandmother  . Hypertension Maternal Aunt        several family members  . Throat cancer Maternal Uncle   . Asthma Other        cousin, maternal  . Breast cancer Maternal Aunt        total of 5 aunts  . Breast cancer Maternal Aunt   . Colon cancer Paternal Grandfather   . Heart attack Neg Hx   .  Rectal cancer Neg Hx   . Stomach cancer Neg Hx     Social History   Socioeconomic History  . Marital status: Single    Spouse name: Not on file  . Number of children: 0  . Years of education: BS  . Highest education level: Not on file  Occupational History  . Occupation: Paramedic, going to school    Employer: [employer]  . Occupation: Air traffic controller  Tobacco Use  . Smoking status: Never Smoker  . Smokeless tobacco: Never Used  Vaping Use  . Vaping Use: Never used  Substance and Sexual Activity  . Alcohol use: Yes    Alcohol/week: 0.0 standard drinks    Comment: socially - occasional   . Drug use: No  . Sexual activity: Yes    Birth control/protection: Inserts    Comment: nuvaring  Other Topics Concern  . Not on file  Social History Narrative   Household:sister and her 3 kids    Drinks occasional starbucks drink       Social Determinants of Health   Financial Resource Strain: Not on file  Food Insecurity: Not on file  Transportation Needs: Not on file  Physical Activity: Not on file  Stress: Not on file  Social Connections: Not on file  Intimate Partner Violence: Not on file    Outpatient Medications  Prior to Visit  Medication Sig Dispense Refill  . albuterol (PROAIR HFA) 108 (90 Base) MCG/ACT inhaler Inhale 2 puffs into the lungs every 6 (six) hours as needed for wheezing or shortness of breath. 18 g 2  . bimatoprost (LATISSE) 0.03 % ophthalmic solution APPLY ALONG UPPER EYELID MARGIN AT BASE OF EYELASHES ONCE A DAY    . cetirizine (ZYRTEC) 10 MG tablet TAKE 1 TABLET BY MOUTH DAILY 30 tablet 5  . ESTROGEL 0.75 MG/1.25 GM (0.06%) topical gel Apply 1 application topically daily.  0  . etonogestrel-ethinyl estradiol (NUVARING) 0.12-0.015 MG/24HR vaginal ring Place 1 each vaginally every 28 (twenty-eight) days. Insert vaginally and leave in place for 3 consecutive weeks, then remove for 1 week.    . furosemide (LASIX) 20 MG tablet Take 1 tablet (20 mg total) by mouth daily. 5 tablet 0  . ibuprofen (ADVIL) 800 MG tablet Take 1 tablet (800 mg total) by mouth every 6 (six) hours as needed. for pain 30 tablet 3  . linaclotide (LINZESS) 72 MCG capsule Take 1 capsule (72 mcg total) by mouth daily before breakfast. 30 capsule 6  . Liraglutide -Weight Management (SAXENDA) 18 MG/3ML SOPN 3 MG SUBCUTANEOUSLY DAILY 3 mL 2  . Multiple Vitamin (MULTIVITAMIN) LIQD Take 5 mLs by mouth daily.    . ondansetron (ZOFRAN) 4 MG tablet Take 1 tablet (4 mg total) by mouth every 8 (eight) hours as needed for nausea or vomiting. 20 tablet 0  . PREVIDENT 5000 SENSITIVE 1.1-5 % PSTE USE BEFORE BED DO NOT RINSE    . promethazine-dextromethorphan (PROMETHAZINE-DM) 6.25-15 MG/5ML syrup Take 5 mLs by mouth 4 (four) times daily as needed. 118 mL 0  . RESTASIS 0.05 % ophthalmic emulsion PLACE 1 DROP INTO BOTH EYES AS NEEDED. (Patient taking differently: Place 1 drop into both eyes daily as needed (dry eyes).) 60 mL 3  . Ubrogepant ([name]) 50 MG TABS Take 50 mg by mouth 2 (two) times daily as needed (1 pill at onset of migraine, may repeat in 2 hours, no more than 2 pills in 24 hours.). 10 tablet 3  . ondansetron (ZOFRAN  ODT) 4  MG disintegrating tablet 4mg  ODT q4 hours prn nausea/vomit 20 tablet 0  . Vitamin D, Ergocalciferol, (DRISDOL) 1.25 MG (50000 UNIT) CAPS capsule TAKE 1 CAPSULE (50,000 UNITS TOTAL) BY MOUTH EVERY 7 (SEVEN) DAYS. 12 capsule 1   No facility-administered medications prior to visit.    Allergies  Allergen Reactions  . Acetaminophen-Codeine Itching  . Hydrocodone Nausea Only  . Oxycodone Itching  . Codeine Itching and Nausea Only    Review of Systems  Constitutional: Positive for malaise/fatigue. Negative for fever.  HENT: Negative for congestion.   Eyes: Negative for blurred vision.  Respiratory: Negative for cough and shortness of breath.   Cardiovascular: Negative for chest pain, palpitations and leg swelling.  Gastrointestinal: Negative for vomiting.  Musculoskeletal: Positive for myalgias (Right pinky). Negative for back pain.  Skin: Negative for rash.  Neurological: Negative for loss of consciousness and headaches.       Objective:    Physical Exam Vitals and nursing note reviewed.  Constitutional:      General: She is not in acute distress.    Appearance: Normal appearance. She is not ill-appearing.  HENT:     Head: Normocephalic and atraumatic.     Right Ear: External ear normal.     Left Ear: External ear normal.  Eyes:     Extraocular Movements: Extraocular movements intact.     Pupils: Pupils are equal, round, and reactive to light.  Cardiovascular:     Rate and Rhythm: Normal rate and regular rhythm.     Pulses: Normal pulses.     Heart sounds: Normal heart sounds. No murmur heard. No gallop.   Pulmonary:     Effort: Pulmonary effort is normal. No respiratory distress.     Breath sounds: Normal breath sounds. No wheezing, rhonchi or rales.  Musculoskeletal:     Comments: Cleaned right pinky with alcohol. Used 18 in needle to break up skin. Used forceps to pull out small object. Cleaned area afterwards and and bandaid applied.  Skin:    General: Skin is warm  and dry.  Neurological:     Mental Status: She is alert and oriented to person, place, and time.  Psychiatric:        Behavior: Behavior normal.     BP 110/68 (BP Location: Right Arm, Cuff Size: Normal)   Pulse 92   Temp 98.9 F (37.2 C) (Oral)   Resp 12   Ht 5\' 3"  (1.6 m)   Wt 215 lb 9.6 oz (97.8 kg)   SpO2 99%   BMI 38.19 kg/m  Wt Readings from Last 3 Encounters:  11/09/20 215 lb 9.6 oz (97.8 kg)  11/02/20 218 lb (98.9 kg)  10/21/20 221 lb 9.6 oz (100.5 kg)    Diabetic Foot Exam - Simple   No data filed    Lab Results  Component Value Date   WBC 9.4 11/02/2020   HGB 12.7 11/02/2020   HCT 39.1 11/02/2020   PLT 347.0 11/02/2020   GLUCOSE 80 11/02/2020   CHOL 191 05/06/2018   TRIG 93 05/21/2019   HDL 65 05/06/2018   LDLCALC 104 (H) 05/06/2018   ALT 15 11/02/2020   AST 15 11/02/2020   NA 138 11/02/2020   K 4.4 11/02/2020   CL 105 11/02/2020   CREATININE 0.77 11/02/2020   BUN 20 11/02/2020   CO2 25 11/02/2020   TSH 0.98 11/02/2020   INR 1.0 12/15/2019   HGBA1C 5.9 (H) 05/06/2018    Lab Results  Component  Value Date   TSH 0.98 11/02/2020   Lab Results  Component Value Date   WBC 9.4 11/02/2020   HGB 12.7 11/02/2020   HCT 39.1 11/02/2020   MCV 90.0 11/02/2020   PLT 347.0 11/02/2020   Lab Results  Component Value Date   NA 138 11/02/2020   K 4.4 11/02/2020   CO2 25 11/02/2020   GLUCOSE 80 11/02/2020   BUN 20 11/02/2020   CREATININE 0.77 11/02/2020   BILITOT 0.4 11/02/2020   ALKPHOS 69 11/02/2020   AST 15 11/02/2020   ALT 15 11/02/2020   PROT 6.7 11/02/2020   ALBUMIN 3.9 11/02/2020   CALCIUM 8.9 11/02/2020   ANIONGAP 9 12/15/2019   GFR 88.95 11/02/2020   Lab Results  Component Value Date   CHOL 191 05/06/2018   Lab Results  Component Value Date   HDL 65 05/06/2018   Lab Results  Component Value Date   LDLCALC 104 (H) 05/06/2018   Lab Results  Component Value Date   TRIG 93 05/21/2019   Lab Results  Component Value Date    CHOLHDL 3 05/14/2014   Lab Results  Component Value Date   HGBA1C 5.9 (H) 05/06/2018       Assessment & Plan:   Problem List Items Addressed This Visit   None   Visit Diagnoses    Vitamin B deficiency    -  Primary   Relevant Medications   cyanocobalamin ((VITAMIN B-12)) injection 1,000 mcg (Completed)   Other Relevant Orders   Vitamin B12   Vitamin D deficiency       Relevant Medications   Vitamin D, Ergocalciferol, (DRISDOL) 1.25 MG (50000 UNIT) CAPS capsule   Nausea and vomiting, intractability of vomiting not specified, unspecified vomiting type       Relevant Medications   ondansetron (ZOFRAN ODT) 4 MG disintegrating tablet       Meds ordered this encounter  Medications  . Vitamin D, Ergocalciferol, (DRISDOL) 1.25 MG (50000 UNIT) CAPS capsule    Sig: Take 1 capsule (50,000 Units total) by mouth every 7 (seven) days.    Dispense:  12 capsule    Refill:  1  . ondansetron (ZOFRAN ODT) 4 MG disintegrating tablet    Sig: 4mg  ODT q4 hours prn nausea/vomit    Dispense:  20 tablet    Refill:  0  . cyanocobalamin ((VITAMIN B-12)) injection 1,000 mcg    I, Dr. Roma Schanz, DO, personally preformed the services described in this documentation.  All medical record entries made by the scribe were at my direction and in my presence.  I have reviewed the chart and discharge instructions (if applicable) and agree that the record reflects my personal performance and is accurate and complete. 11/09/2020   I,[name],acting as a scribe for [name], DO.,have documented all relevant documentation on the behalf of [name], DO,as directed by  [name], DO while in the presence of [name], DO.   [name], DO

## 2020-11-10 ENCOUNTER — Emergency Department (HOSPITAL_BASED_OUTPATIENT_CLINIC_OR_DEPARTMENT_OTHER): Payer: BC Managed Care – PPO

## 2020-11-10 ENCOUNTER — Encounter (HOSPITAL_BASED_OUTPATIENT_CLINIC_OR_DEPARTMENT_OTHER): Payer: Self-pay

## 2020-11-10 ENCOUNTER — Emergency Department (HOSPITAL_BASED_OUTPATIENT_CLINIC_OR_DEPARTMENT_OTHER)
Admission: EM | Admit: 2020-11-10 | Discharge: 2020-11-10 | Disposition: A | Discharge status: Home / Self Care | Payer: BC Managed Care – PPO | Attending: Emergency Medicine | Admitting: Emergency Medicine

## 2020-11-10 ENCOUNTER — Other Ambulatory Visit: Payer: Self-pay

## 2020-11-10 DIAGNOSIS — Z8616 Personal history of COVID-19: Secondary | ICD-10-CM | POA: Diagnosis not present

## 2020-11-10 DIAGNOSIS — J4540 Moderate persistent asthma, uncomplicated: Secondary | ICD-10-CM | POA: Insufficient documentation

## 2020-11-10 DIAGNOSIS — R200 Anesthesia of skin: Secondary | ICD-10-CM | POA: Diagnosis not present

## 2020-11-10 DIAGNOSIS — R0602 Shortness of breath: Secondary | ICD-10-CM | POA: Diagnosis not present

## 2020-11-10 DIAGNOSIS — M549 Dorsalgia, unspecified: Secondary | ICD-10-CM | POA: Insufficient documentation

## 2020-11-10 DIAGNOSIS — R21 Rash and other nonspecific skin eruption: Secondary | ICD-10-CM | POA: Diagnosis not present

## 2020-11-10 DIAGNOSIS — F600 Paranoid personality disorder: Secondary | ICD-10-CM | POA: Diagnosis not present

## 2020-11-10 DIAGNOSIS — R0600 Dyspnea, unspecified: Secondary | ICD-10-CM | POA: Diagnosis not present

## 2020-11-10 LAB — COMPREHENSIVE METABOLIC PANEL
ALT: 18 U/L (ref 0–44)
AST: 21 U/L (ref 15–41)
Albumin: 3.8 g/dL (ref 3.5–5.0)
Alkaline Phosphatase: 69 U/L (ref 38–126)
Anion gap: 7 (ref 5–15)
BUN: 14 mg/dL (ref 6–20)
CO2: 25 mmol/L (ref 22–32)
Calcium: 8.4 mg/dL — ABNORMAL LOW (ref 8.9–10.3)
Chloride: 105 mmol/L (ref 98–111)
Creatinine, Ser: 0.85 mg/dL (ref 0.44–1.00)
GFR, Estimated: >60 mL/min (ref >60)
Glucose, Bld: 85 mg/dL (ref 70–99)
Potassium: 3.7 mmol/L (ref 3.5–5.1)
Sodium: 137 mmol/L (ref 135–145)
Total Bilirubin: 0.3 mg/dL (ref 0.3–1.2)
Total Protein: 7.6 g/dL (ref 6.5–8.1)

## 2020-11-10 LAB — CBC WITH DIFFERENTIAL/PLATELET
Abs Immature Granulocytes: 0.05 10*3/uL (ref 0.00–0.07)
Basophils Absolute: 0.1 10*3/uL (ref 0.0–0.1)
Basophils Relative: 1 %
Eosinophils Absolute: 0.1 10*3/uL (ref 0.0–0.5)
Eosinophils Relative: 1 %
HCT: 41.5 % (ref 36.0–46.0)
Hemoglobin: 13.7 g/dL (ref 12.0–15.0)
Immature Granulocytes: 0 %
Lymphocytes Relative: 20 %
Lymphs Abs: 2.2 10*3/uL (ref 0.7–4.0)
MCH: 29.6 pg (ref 26.0–34.0)
MCHC: 33 g/dL (ref 30.0–36.0)
MCV: 89.6 fL (ref 80.0–100.0)
Monocytes Absolute: 0.8 10*3/uL (ref 0.1–1.0)
Monocytes Relative: 7 %
Neutro Abs: 7.9 10*3/uL — ABNORMAL HIGH (ref 1.7–7.7)
Neutrophils Relative %: 71 %
Platelets: 292 10*3/uL (ref 150–400)
RBC: 4.63 MIL/uL (ref 3.87–5.11)
RDW: 13.2 % (ref 11.5–15.5)
WBC: 11.1 10*3/uL — ABNORMAL HIGH (ref 4.0–10.5)
nRBC: 0 % (ref 0.0–0.2)

## 2020-11-10 MED ORDER — VALACYCLOVIR HCL 1 G PO TABS: 1000.0000 mg | ORAL_TABLET | Freq: Three times a day (TID) | ORAL | 0 refills | Status: DC

## 2020-11-10 MED ORDER — VALACYCLOVIR HCL 500 MG PO TABS
1000.0000 mg | ORAL_TABLET | Freq: Once | ORAL | Status: AC
  Administered 2020-11-10: 1000 mg via ORAL
  Filled 2020-11-10: qty 2

## 2020-11-10 MED ORDER — ONDANSETRON 4 MG PO TBDP
4.0000 mg | ORAL_TABLET | Freq: Once | ORAL | Status: AC
  Administered 2020-11-10: 4 mg via ORAL
  Filled 2020-11-10: qty 1

## 2020-11-10 MED ORDER — ACETAMINOPHEN 325 MG PO TABS
650.0000 mg | ORAL_TABLET | Freq: Once | ORAL | Status: AC
  Administered 2020-11-10: 650 mg via ORAL
  Filled 2020-11-10: qty 2

## 2020-11-10 NOTE — Progress Notes (Deleted)
Cardiology Office Note:    Date:  11/10/2020   ID:  Morgan Long, DOB Mar 24, 1971, MRN [id number]  PCP:  Carollee Herter, Alferd Apa, DO  Cardiologist:  [name] More, MD    Referring MD: Carollee Herter, Alferd Apa, *    ASSESSMENT:    No diagnosis found. PLAN:    In order of problems listed above:     Next appointment: ***   Medication Adjustments/Labs and Tests Ordered: Current medicines are reviewed at length with the patient today.  Concerns regarding medicines are outlined above.  No orders of the defined types were placed in this encounter.  No orders of the defined types were placed in this encounter.   No chief complaint on file.   History of Present Illness:    Morgan Long is a 50 y.o. female with a hx of symptomatic PVCs intolerant of a beta-blocker which occurred post COVID infection.  Last seen ***.  She is seen by pulmonary with moderate persistent asthma.  She also has fatigue felt to be sequelae of COVID-19 infection. Echocardiogram performed 09/01/2019 showed EF of 60 to 65% and no findings of cardiomyopathy.  Her ZIO monitor showed frequent ventricular ectopy 7.8% burden with no episodes of ventricular tachycardia.  She is seen in the ED in July with right upper quadrant pain after liver biopsy, CMP showed mild hypocalcemia low albumin otherwise normal potassium 4.7 creatinine 0.88 GFR greater than 60 cc minute white count was elevated 12,000 hemoglobin 12.5.  She was seen by GI in follow-up 01/30/2020 recommended repeat echocardiogram and concerns of hepatic congestion from biopsy.  Echocardiograms performed [facility] 02/23/2020 showing a normal left ventricular ejection fraction normal left ventricular filling pressure normal right ventricular size and function there was no tricuspid regurgitation of the IVC was normal in size with collapse indicating normal right atrial pressure.    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  Compliance with diet, lifestyle and medications: ***  She had cardiac CTA reported 09/21/2020 with a calcium score of 0 normal coronary arteries and thoracic aorta and the appearance of the lung was normal.  Past Medical History:  Diagnosis Date   Allergic rhinitis    Asthma    Chronic migraine    Dr. Catalina Gravel   Constipation    Dry eye syndrome of both lacrimal glands    Dry skin    Endometriosis    Fatigue    Gastroparesis    GERD (gastroesophageal reflux disease)    Headache(784.0)    occasional, dx w/ Migraines before, Topamax helps   Heartburn    Hepatic steatosis    History of stomach ulcers    Lactose intolerance    Localized edema    Migraine    Muscle weakness (generalized)    Nuclear cataract of both eyes    Mild   Overweight    Pain in right ankle and joints of right foot    Shortness of breath    Sinus complaint    Sinusitis    Tired    TMJ pain dysfunction syndrome    occasional    Past Surgical History:  Procedure Laterality Date   ANKLE SURGERY Left 11/16/2017   AUGMENTATION MAMMAPLASTY Bilateral 2006   BREAST ENHANCEMENT SURGERY  2006   BUNIONECTOMY     COLONOSCOPY     endrometroisis     fallopian tube removed     Left   LAPAROSCOPIC PARTIAL HEPATECTOMY  [facility] --- due to hepatic tumor   UPPER GASTROINTESTINAL ENDOSCOPY     wisdoim teeth extraction      Current Medications: No outpatient medications have been marked as taking for the 11/11/20 encounter (Appointment) with Richardo Priest, MD.     Allergies:   Acetaminophen-codeine, Hydrocodone, Oxycodone, and Codeine   Social History   Socioeconomic History   Marital status: Single    Spouse name: Not on file   Number of children: 0   Years of education: BS   Highest education level: Not on file  Occupational History   Occupation: Paramedic, going to school    Employer: [employer]   Occupation: Special educational needs teacher  Tobacco Use   Smoking status: Never Smoker   Smokeless tobacco: Never Used  Vaping Use   Vaping Use: Never used  Substance and Sexual Activity   Alcohol use: Yes    Alcohol/week: 0.0 standard drinks    Comment: socially - occasional    Drug use: No   Sexual activity: Yes    Birth control/protection: Inserts    Comment: nuvaring  Other Topics Concern   Not on file  Social History Narrative   Household:sister and her 3 kids    Drinks occasional starbucks drink       Social Determinants of Health   Financial Resource Strain: Not on file  Food Insecurity: Not on file  Transportation Needs: Not on file  Physical Activity: Not on file  Stress: Not on file  Social Connections: Not on file     Family History: The patient's ***family history includes Alcoholism in her mother; Asthma in an other family member; Breast cancer in her maternal aunt, maternal aunt, maternal aunt, and maternal aunt; Cancer in her father and mother; Cancer - Other (age of onset: 32) in her mother; Colon cancer in her paternal grandfather; Colon cancer (age of onset: 66) in her maternal uncle; Diabetes in an other family member; Heart disease in her father; Hypertension in her maternal aunt; Pancreatic cancer in her maternal aunt; Throat cancer in her maternal uncle and mother. There is no history of Heart attack, Rectal cancer, or Stomach cancer. ROS:   Please see the history of present illness.    All other systems reviewed and are negative.  EKGs/Labs/Other Studies Reviewed:    The following studies were reviewed today:  EKG:  EKG ordered today and personally reviewed.  The ekg ordered today demonstrates ***  Recent Labs: 11/02/2020: ALT 15; BUN 20; Creatinine, Ser 0.77; Hemoglobin 12.7; Platelets 347.0; Potassium 4.4; Sodium 138; TSH 0.98  Recent Lipid Panel    Component Value Date/Time   CHOL 191 05/06/2018 1421   TRIG 93 05/21/2019 2145   HDL 65 05/06/2018 1421   CHOLHDL 3 05/14/2014  1223   VLDL 12.2 05/14/2014 1223   LDLCALC 104 (H) 05/06/2018 1421    Physical Exam:    VS:  There were no vitals taken for this visit.    Wt Readings from Last 3 Encounters:  11/09/20 215 lb 9.6 oz (97.8 kg)  11/02/20 218 lb (98.9 kg)  10/21/20 221 lb 9.6 oz (100.5 kg)     GEN: *** Well nourished, well developed in no acute distress HEENT: Normal NECK: No JVD; No carotid bruits LYMPHATICS: No lymphadenopathy CARDIAC: ***RRR, no murmurs, rubs, gallops RESPIRATORY:  Clear to auscultation without rales, wheezing or rhonchi  ABDOMEN: Soft, non-tender, non-distended MUSCULOSKELETAL:  No edema; No deformity  SKIN: Warm and dry NEUROLOGIC:  Alert and oriented x 3 PSYCHIATRIC:  Normal affect    Signed, [name] More, MD  11/10/2020 5:43 PM    [place]

## 2020-11-10 NOTE — ED Provider Notes (Signed)
[facility] EMERGENCY DEPARTMENT Provider Note   CSN: [id number] Arrival date & time: 11/10/20  2050     History Chief Complaint  Patient presents with  . Insect Bite    Morgan Long is a 50 y.o. female.  Patient presents to the emergency department today for evaluation of a rash to her right neck and shoulder area.  Patient states that she walked through a spiderweb and felt something bite her 3 days ago.  Yesterday she developed the rash.  She did not have any preceding burning or pain and does not feel any burning or pain currently.  She states that her right arm sensation feels little bit different (tingling) than normal but she is not weak.  She states that she feels she is having difficulty breathing and pain in her upper back at this time.  States that her throat is sore. Saw her MD yesterday but did not discuss this problem because her symptoms worsen last night.  The onset of this condition was acute. The course is constant. Aggravating factors: none. Alleviating factors: none.          Past Medical History:  Diagnosis Date  . Allergic rhinitis   . Asthma   . Chronic migraine    Dr. Catalina Gravel  . Constipation   . Dry eye syndrome of both lacrimal glands   . Dry skin   . Endometriosis   . Fatigue   . Gastroparesis   . GERD (gastroesophageal reflux disease)   . Headache(784.0)    occasional, dx w/ Migraines before, Topamax helps  . Heartburn   . Hepatic steatosis   . History of stomach ulcers   . Lactose intolerance   . Localized edema   . Migraine   . Muscle weakness (generalized)   . Nuclear cataract of both eyes    Mild  . Overweight   . Pain in right ankle and joints of right foot   . Shortness of breath   . Sinus complaint   . Sinusitis   . Tired   . TMJ pain dysfunction syndrome    occasional    Patient Active Problem List   Diagnosis Date Noted  . Preventative health care 11/04/2020  . Shortness of breath   . COVID 09/13/2020  .  Adhesive capsulitis of left shoulder 08/04/2020  . Pain in joint of left shoulder 08/03/2020  . Hepatic adenoma 06/08/2020  . Liver lesion 05/04/2020  . TMJ pain dysfunction syndrome   . Tired   . Sinus complaint   . SOB (shortness of breath)   . Pain in right ankle and joints of right foot   . Overweight   . Muscle weakness (generalized)   . Migraine   . Localized edema   . Lactose intolerance   . History of stomach ulcers   . Hepatic steatosis   . Heartburn   . Fatigue   . Dry skin   . Dry eye syndrome of both lacrimal glands   . Constipation   . Chronic migraine   . Asthma   . History of COVID-19 01/05/2020  . Anxiety 05/12/2019  . Abnormality of lung on CXR 03/06/2019  . Moderate persistent asthma 01/23/2019  . Lower extremity edema 01/23/2019  . Capsulitis of ankle, left 09/24/2018  . Synovitis of left ankle 09/24/2018  . Ankle injury, initial encounter 05/23/2018  . Acute pain of left shoulder 03/12/2018  . S/P repair of ligament of ankle 03/05/2018  . Chronic pain of right  ankle 10/28/2017  . Other fatigue 08/14/2017  . Dyspnea 08/14/2017  . Other insomnia 08/14/2017  . Medication management 06/12/2017  . Nonintractable headache 06/12/2017  . Gastroparesis 06/12/2017  . Severe obesity (BMI >= 40) ([place]) 04/25/2017  . Leukocytosis 03/01/2017  . Obesity (BMI 35.0-39.9 without comorbidity) 03/01/2017  . Chronic migraine without aura without status migrainosus, not intractable 03/01/2017  . Sinusitis 07/31/2016  . Influenza-like illness 07/12/2016  . Peroneal tendinitis of left lower extremity 11/03/2015  . Pes anserine bursitis 11/03/2015  . Asthma with acute exacerbation 06/15/2015  . Acute sinusitis 06/15/2015  . GERD (gastroesophageal reflux disease) 01/13/2015  . Myalgia and myositis 07/22/2014  . 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 Paresthesias 06/02/2014  . Acute pharyngitis 05/20/2014  . Degenerative cervical disc 10/28/2013  . Bursitis, shoulder  09/17/2013  . Muscle spasm of back 09/11/2013  . Nonallopathic lesion of thoracic region 09/11/2013  . General medical examination 03/28/2011  . Chronic rhinitis   . TMJ PAIN 12/16/2007  . Headache(784.0) 12/16/2007    Past Surgical History:  Procedure Laterality Date  . ANKLE SURGERY Left 11/16/2017  . AUGMENTATION MAMMAPLASTY Bilateral 2006  . BREAST ENHANCEMENT SURGERY  2006  . BUNIONECTOMY    . COLONOSCOPY    . endrometroisis    . fallopian tube removed     Left  . 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  . UPPER GASTROINTESTINAL ENDOSCOPY    . wisdoim teeth extraction       OB History    Gravida  0   Para  0   Term  0   Preterm  0   AB  0   Living  0     SAB  0   IAB  0   Ectopic  0   Multiple  0   Live Births  0           Family History  Problem Relation Age of Onset  . Throat cancer Mother   . Cancer - Other Mother 55       throat - died 44 months  . Cancer Mother   . Alcoholism Mother   . Heart disease Father   . Cancer Father   . Breast cancer Maternal Aunt        breast  . Colon cancer Maternal Uncle 7       died 57  . Breast cancer Maternal Aunt        breast  . Pancreatic cancer Maternal Aunt   . Diabetes Other        grandmother  . Hypertension Maternal Aunt        several family members  . Throat cancer Maternal Uncle   . Asthma Other        cousin, maternal  . Breast cancer Maternal Aunt        total of 5 aunts  . Breast cancer Maternal Aunt   . Colon cancer Paternal Grandfather   . Heart attack Neg Hx   . Rectal cancer Neg Hx   . Stomach cancer Neg Hx     Social History   Tobacco Use  . Smoking status: Never Smoker  . Smokeless tobacco: Never Used  Vaping Use  . Vaping Use: Never used  Substance Use Topics  . Alcohol use: Yes    Alcohol/week: 0.0 standard drinks    Comment: socially - occasional   . Drug use: No    Home Medications Prior  to Admission medications    Medication Sig Start Date End Date Taking? Authorizing Provider  albuterol (PROAIR HFA) 108 (90 Base) MCG/ACT inhaler Inhale 2 puffs into the lungs every 6 (six) hours as needed for wheezing or shortness of breath. 01/23/19   Carollee Herter, Yvonne R, DO  bimatoprost (LATISSE) 0.03 % ophthalmic solution APPLY ALONG UPPER EYELID MARGIN AT BASE OF EYELASHES ONCE A DAY 12/03/18   [provider]  cetirizine (ZYRTEC) 10 MG tablet TAKE 1 TABLET BY MOUTH DAILY 05/10/18   Carollee Herter, Yvonne R, DO  ESTROGEL 0.75 MG/1.25 GM (0.06%) topical gel Apply 1 application topically daily. 05/18/15   [provider]  etonogestrel-ethinyl estradiol (NUVARING) 0.12-0.015 MG/24HR vaginal ring Place 1 each vaginally every 28 (twenty-eight) days. Insert vaginally and leave in place for 3 consecutive weeks, then remove for 1 week.    [provider]  furosemide (LASIX) 20 MG tablet Take 1 tablet (20 mg total) by mouth daily. 10/21/20   Rigoberto Noel, MD  ibuprofen (ADVIL) 800 MG tablet Take 1 tablet (800 mg total) by mouth every 6 (six) hours as needed. for pain 12/18/19   Carollee Herter, Alferd Apa, DO  linaclotide [facility]) 72 MCG capsule Take 1 capsule (72 mcg total) by mouth daily before breakfast. 04/26/20   Carollee Herter, Alferd Apa, DO  Liraglutide -Weight Management (SAXENDA) 18 MG/3ML SOPN 3 MG SUBCUTANEOUSLY DAILY 05/24/20   Debbrah Alar, NP  Multiple Vitamin (MULTIVITAMIN) LIQD Take 5 mLs by mouth daily.    [provider]  ondansetron (ZOFRAN ODT) 4 MG disintegrating tablet 4mg  ODT q4 hours prn nausea/vomit 11/09/20   Carollee Herter, Alferd Apa, DO  ondansetron (ZOFRAN) 4 MG tablet Take 1 tablet (4 mg total) by mouth every 8 (eight) hours as needed for nausea or vomiting. 11/02/20   Carollee Herter, Alferd Apa, DO  PREVIDENT 5000 SENSITIVE 1.1-5 % PSTE USE BEFORE BED DO NOT RINSE 11/21/18   [provider]  promethazine-dextromethorphan (PROMETHAZINE-DM) 6.25-15 MG/5ML syrup Take 5 mLs by  mouth 4 (four) times daily as needed. 11/02/20   Lowne Chase, Yvonne R, DO  RESTASIS 0.05 % ophthalmic emulsion PLACE 1 DROP INTO BOTH EYES AS NEEDED. Patient taking differently: Place 1 drop into both eyes daily as needed (dry eyes). 03/29/20   Lowne Chase, Yvonne R, DO  Ubrogepant ([name]) 50 MG TABS Take 50 mg by mouth 2 (two) times daily as needed (1 pill at onset of migraine, may repeat in 2 hours, no more than 2 pills in 24 hours.). 03/31/20   Star Age, MD  Vitamin D, Ergocalciferol, (DRISDOL) 1.25 MG (50000 UNIT) CAPS capsule Take 1 capsule (50,000 Units total) by mouth every 7 (seven) days. 11/09/20   [name], DO    Allergies    Acetaminophen-codeine, Hydrocodone, Oxycodone, and Codeine  Review of Systems   Review of Systems  Constitutional: Negative for fever.  HENT: Positive for sore throat. Negative for rhinorrhea.   Eyes: Negative for redness.  Respiratory: Positive for shortness of breath. Negative for cough.   Cardiovascular: Negative for chest pain.  Gastrointestinal: Negative for abdominal pain, diarrhea, nausea and vomiting.  Genitourinary: Negative for dysuria, frequency, hematuria and urgency.  Musculoskeletal: Negative for myalgias.  Skin: Positive for rash.  Neurological: Negative for weakness and headaches.    Physical Exam Updated Vital Signs BP 125/81 (BP Location: Left Arm)   Pulse (!) 105   Temp 100.1 F (37.8 C) (Oral)   Resp 20   Ht 5'  3" (1.6 m)   Wt 98.4 kg   LMP 10/17/2020   SpO2 100%   BMI 38.44 kg/m   Physical Exam Vitals and nursing note reviewed.  Constitutional:      General: She is not in acute distress.    Appearance: She is well-developed.  HENT:     Head: Normocephalic and atraumatic.     Right Ear: External ear normal.     Left Ear: External ear normal.     Nose: Nose normal.     Mouth/Throat:     Pharynx: Posterior oropharyngeal erythema present. No oropharyngeal exudate.     Comments: No oropharyngeal edema.   Eyes:     Conjunctiva/sclera: Conjunctivae normal.  Cardiovascular:     Rate and Rhythm: Normal rate and regular rhythm.     Heart sounds: No murmur heard.   Pulmonary:     Effort: No respiratory distress.     Breath sounds: No wheezing, rhonchi or rales.  Abdominal:     Palpations: Abdomen is soft.     Tenderness: There is no abdominal tenderness. There is no guarding or rebound.  Musculoskeletal:     Cervical back: Normal range of motion and neck supple.     Right lower leg: No edema.     Left lower leg: No edema.  Skin:    General: Skin is warm and dry.     Findings: Rash present.     Comments: Vesicular rash noted neck and shoulder  Neurological:     General: No focal deficit present.     Mental Status: She is alert. Mental status is at baseline.     Motor: No weakness.  Psychiatric:        Mood and Affect: Mood is anxious.         ED Results / Procedures / Treatments   Labs (all labs ordered are listed, but only abnormal results are displayed) Labs Reviewed  CBC WITH DIFFERENTIAL/PLATELET - Abnormal; Notable for the following components:      Result Value   WBC 11.1 (*)    Neutro Abs 7.9 (*)    All other components within normal limits  COMPREHENSIVE METABOLIC PANEL - Abnormal; Notable for the following components:   Calcium 8.4 (*)    All other components within normal limits    EKG None  Radiology DG Chest 2 View  Result Date: 11/10/2020 CLINICAL DATA:  Status post spider bite with rash and shortness of breath. EXAM: CHEST - 2 VIEW COMPARISON:  August 06, 2019 FINDINGS: The heart size and mediastinal contours are within normal limits. Both lungs are clear. Ill-defined bilateral breast implants are seen. The visualized skeletal structures are unremarkable. IMPRESSION: No active cardiopulmonary disease. Electronically Signed   By: Virgina Norfolk M.D.   On: 11/10/2020 21:47    Procedures Procedures   Medications Ordered in ED Medications   valACYclovir (VALTREX) tablet 1,000 mg (has no administration in time range)  acetaminophen (TYLENOL) tablet 650 mg (650 mg Oral Given 11/10/20 2222)  ondansetron (ZOFRAN-ODT) disintegrating tablet 4 mg (4 mg Oral Given 11/10/20 2222)    ED Course  I have reviewed the triage vital signs and the nursing notes.  Pertinent labs & imaging results that were available during my care of the patient were reviewed by me and considered in my medical decision making (see chart for details).  Patient seen and examined. Work-up initiated. Rash appears suspicious for shingles, however not classic. Will check labs/CXR due to this and mild elevation  in temperature. Pt discussed with and seen by Dr. Tyrone Nine. If work-up reassuring, plan shingles treatment and PCP f/u.   Vital signs reviewed and are as follows: BP 125/81 (BP Location: Left Arm)   Pulse (!) 105   Temp 100.1 F (37.8 C) (Oral)   Resp 20   Ht 5\' 3"  (1.6 m)   Wt 98.4 kg   LMP 10/17/2020   SpO2 100%   BMI 38.44 kg/m   10:44 PM Labs are reassuring.  Patient updated.  Will give dose of valacyclovir prior to discharge.  Encourage PCP follow-up if symptoms not improving in the next 72 hours.  Encouraged return to the emergency department with worsening symptoms, high fever, persistent vomiting, any symptoms or other concerns.  She verbalizes understanding and agrees with plan.    MDM Rules/Calculators/A&P                          Patient with nonspecific vesicular rash, concerning for possible herpes zoster.  Low-grade fever to 100.1 F in the ED.  No other focal areas of infection.  Labs are reassuring with only minimally elevated white blood cell count.  Chest x-ray obtained due to reported shortness of breath and is negative.  At this point, feel that patient can be discharged home.  Doubt other cause of shortness of breath such as PE or ACS.   Final Clinical Impression(s) / ED Diagnoses Final diagnoses:  Rash and nonspecific skin eruption     Rx / DC Orders ED Discharge Orders         Ordered    valACYclovir (VALTREX) 1000 MG tablet  3 times daily        11/10/20 2243           [name], PA-C 11/10/20 [place], [place], DO 11/11/20 1610

## 2020-11-10 NOTE — Discharge Instructions (Signed)
Please read and follow all provided instructions.  Your diagnoses today include:  1. Rash and nonspecific skin eruption     Tests performed today include: Blood cell counts (white, red, and platelets) Electrolytes  Kidney function test Chest x-ray - no signs of problems  Vital signs. See below for your results today.   Medications prescribed:   Valtrex - medication to treat possible shingles infection  Take any prescribed medications only as directed.  Home care instructions:  Follow any educational materials contained in this packet.  BE VERY CAREFUL not to take multiple medicines containing Tylenol (also called acetaminophen). Doing so can lead to an overdose which can damage your liver and cause liver failure and possibly death.   Follow-up instructions: Please follow-up with your primary care provider in the next 3 days for further evaluation of your symptoms.   Return instructions:   Please return to the Emergency Department if you experience worsening symptoms.   Please return if you have any other emergent concerns.  Additional Information:  Your vital signs today were: BP 113/63 (BP Location: Left Arm)   Pulse 94   Temp 99.5 F (37.5 C) (Oral)   Resp 18   Ht 5\' 3"  (1.6 m)   Wt 98.4 kg   LMP 10/17/2020   SpO2 100%   BMI 38.44 kg/m  If your blood pressure (BP) was elevated above 135/85 this visit, please have this repeated by your doctor within one month. --------------

## 2020-11-10 NOTE — ED Triage Notes (Signed)
Pt c/o insect bite to right side of neck x 3 days-states she is having "labored breathing and upper back pain" x 2-3 days-pt NAD-steady gait

## 2020-11-11 ENCOUNTER — Ambulatory Visit: Payer: BC Managed Care – PPO | Admitting: Cardiology

## 2020-11-11 ENCOUNTER — Telehealth: Payer: Self-pay | Admitting: Cardiology

## 2020-11-11 NOTE — Telephone Encounter (Signed)
PT is calling to make the doctor aware that she may be late she is coming from [place] and they are doing a lot of road work . She is wanting confirmation if she can come or needs to reschedule

## 2020-11-11 NOTE — Telephone Encounter (Signed)
Spoke to the patient just now and she let me know that she thinks she will be right on time for her appointment as of right now. I let her know that we have a 20 minute late policy but as long as she is here before that then it will be fine. She verbalizes understanding.    Encouraged patient to call back with any questions or concerns.

## 2020-11-12 ENCOUNTER — Encounter: Payer: Self-pay | Admitting: Family

## 2020-11-12 ENCOUNTER — Ambulatory Visit (INDEPENDENT_AMBULATORY_CARE_PROVIDER_SITE_OTHER): Payer: BC Managed Care – PPO | Admitting: Family

## 2020-11-12 ENCOUNTER — Other Ambulatory Visit: Payer: Self-pay

## 2020-11-12 ENCOUNTER — Other Ambulatory Visit: Payer: Self-pay | Admitting: Family Medicine

## 2020-11-12 DIAGNOSIS — B029 Zoster without complications: Secondary | ICD-10-CM

## 2020-11-12 NOTE — Patient Instructions (Signed)
Please complete your valtrex prescription. Call if worsening pain, redness, swelling.  Shingles  Shingles, which is also known as herpes zoster, is an infection that causes apainful skin rash and fluid-filled blisters. It is caused by a virus. Shingles only develops in people who: Have had chickenpox. Have been vaccinated against chickenpox. Shingles is rare in this group. What are the causes? Shingles is caused by varicella-zoster virus. This is the same virus that causes chickenpox. After a person is exposed to the virus, it stays in the body in an inactive (dormant) state. Shingles develops if the virus is reactivated. This can happen many years after the first (initial) exposure to the virus. It is not known what causes this virus to bereactivated. What increases the risk? People who have had chickenpox or received the chickenpox vaccine are at risk for shingles. Shingles infection is more common in people who: Are older than 52 years of age. Have a weakened disease-fighting system (immune system), such as people with: HIV (human immunodeficiency virus). AIDS (acquired immunodeficiency syndrome). Cancer. Are taking medicines that weaken the immune system, such as organ transplant medicines. Are experiencing a lot of stress. What are the signs or symptoms? Early symptoms of this condition include itching, tingling, and pain in an areaon your skin. Pain may be described as burning, stabbing, or throbbing. A few days or weeks after early symptoms start, a painful red rash appears. The rash is usually on one side of the body and has a band-like or belt-like pattern. The rash eventually turns into fluid-filled blisters that break open,change into scabs, and dry up in about 2-3 weeks. At any time during the infection, you may also develop: A fever. Chills. A headache. Nausea. How is this diagnosed? This condition is diagnosed with a skin exam. Skin or fluid samples (a culture) may be taken  from the blisters before a diagnosis is made. How is this treated? The rash may last for several weeks. There is not a specific cure for this condition. Your health care provider may prescribe medicines to help you manage pain, recover more quickly, and avoid long-term problems. Medicines may include: Antiviral medicines. Anti-inflammatory medicines. Pain medicines. Anti-itching medicines (antihistamines). If the area involved is on your face, you may be referred to a specialist, such as an eye doctor (ophthalmologist) or an ear, nose, and throat (ENT) doctor (otorhinolaryngologist) to help you avoid eye problems, chronic pain, or disability. Follow these instructions at home: Medicines Take over-the-counter and prescription medicines only as told by your health care provider. Apply an anti-itch cream or numbing cream to the affected area as told by your health care provider. Relieving itching and discomfort  Apply cold, wet cloths (cold compresses) to the area of the rash or blisters as told by your health care provider. Cool baths can be soothing. Try adding baking soda or dry oatmeal to the water to reduce itching. Do not bathe in hot water. Use calamine lotion as recommended by your health care provider. This is an over-the-counter lotion that helps to relieve itchiness.  Blister and rash care Keep your rash covered with a loose bandage (dressing). Wear loose-fitting clothing to help ease the pain of material rubbing against the rash. Wash your hands with soap and water for at least 20 seconds before and after you change your dressing. If soap and water are not available, use hand sanitizer. Change your dressing as told by your health care provider. Keep your rash and blisters clean by washing the area with  mild soap and cool water as told by your health care provider. Check your rash every day for signs of infection. Check for: More redness, swelling, or pain. Fluid or  blood. Warmth. Pus or a bad smell. Do not scratch your rash or pick at your blisters. To help avoid scratching: Keep your fingernails clean and cut short. Wear gloves or mittens while you sleep, if scratching is a problem. General instructions Rest as told by your health care provider. Wash your hands often with soap and water for at least 20 seconds. If soap and water are not available, use hand sanitizer. Doing this lowers your chance of getting a bacterial skin infection. Before your blisters change into scabs, your shingles infection can cause chickenpox in people who have never had it or have never been vaccinated against it. To prevent this from happening, avoid contact with other people, especially: Babies. Pregnant women. Children who have eczema. Older people who have transplants. People who have chronic illnesses, such as cancer or AIDS. Keep all follow-up visits. This is important. How is this prevented? Getting vaccinated is the best way to prevent shingles and protect against shingles complications. If you have not been vaccinated, talk with your healthcare provider about getting the vaccine. Where to find more information Centers for Disease Control and Prevention: http://www.wolf.info/ Contact a health care provider if: Your pain is not relieved with prescribed medicines. Your pain does not get better after the rash heals. You have any of these signs of infection: More redness, swelling, or pain around the rash. Fluid or blood coming from the rash. Warmth coming from your rash. Pus or a bad smell coming from the rash. A fever. Get help right away if: The rash is on your face or nose. You have facial pain, pain around your eye area, or loss of feeling on one side of your face. You have difficulty seeing. You have ear pain or have ringing in your ear. You have a loss of taste. Your condition gets worse. Summary Shingles, also known as herpes zoster, is an infection that causes  a painful skin rash and fluid-filled blisters. This condition is diagnosed with a skin exam. Skin or fluid samples (a culture) may be taken from the blisters. Keep your rash covered with a loose bandage (dressing). Wear loose-fitting clothing to help ease the pain of material rubbing against the rash. Before your blisters change into scabs, your shingles infection can cause chickenpox in people who have never had it or have never been vaccinated against it. This information is not intended to replace advice given to you by your health care provider. Make sure you discuss any questions you have with your healthcare provider. Document Revised: 05/17/2020 Document Reviewed: 05/17/2020 Elsevier Patient Education  2022 [employer].

## 2020-11-12 NOTE — Assessment & Plan Note (Signed)
New.  Advised pt to avoid being around immune compromised people, young children as it can cause chicken pox in others.  Recommended that she complete her valtrex rx.  She is having some pain sleeping. States she has an rx at home for gabapentin. Advised she can try 1 tab at bedtime as needed. We discussed shingrix vaccination after she has recovered. She wishes to get this vaccine.

## 2020-11-12 NOTE — Progress Notes (Addendum)
Subjective:   By signing my name below, I, [name], attest that this documentation has been prepared under the direction and in the presence of Debbrah Alar NP. 11/12/2020     Patient ID: Morgan Long, female    DOB: 07/30/1970, 50 y.o.   MRN: [id number]  Chief Complaint  Patient presents with   Rash    Patient complains of rash in neck    HPI Patient is in today for a office visit. She complains of a rash on her neck and shoulder after she thought a spider had bitten her. She describe the pain as a pulled muscle. After her last visit doctor visit her niece noticed a rash on her neck. The area of the rash felt sore during that time. She then went to the emergency room because the rash started to spread further and she had a fever of 104 degrees. They prescribed her 1000 mg valtrex 3x daily PO. She does not have the shingles vaccine.     Health Maintenance Due  Topic Date Due   Pneumococcal Vaccine 60-58 Years old (1 - PCV) Never done   Zoster Vaccines- Shingrix (1 of 2) Never done   PAP SMEAR-Modifier  05/07/2020   COVID-19 Vaccine (4 - Booster for Pfizer series) 06/12/2020    Past Medical History:  Diagnosis Date   Allergic rhinitis    Asthma    Chronic migraine    Dr. Catalina Gravel   Constipation    Dry eye syndrome of both lacrimal glands    Dry skin    Endometriosis    Fatigue    Gastroparesis    GERD (gastroesophageal reflux disease)    Headache(784.0)    occasional, dx w/ Migraines before, Topamax helps   Heartburn    Hepatic steatosis    History of stomach ulcers    Lactose intolerance    Localized edema    Migraine    Muscle weakness (generalized)    Nuclear cataract of both eyes    Mild   Overweight    Pain in right ankle and joints of right foot    Shortness of breath    Sinus complaint    Sinusitis    Tired    TMJ pain dysfunction syndrome    occasional    Past Surgical History:  Procedure Laterality Date   ANKLE SURGERY Left  11/16/2017   AUGMENTATION MAMMAPLASTY Bilateral 2006   BREAST ENHANCEMENT SURGERY  2006   BUNIONECTOMY     COLONOSCOPY     endrometroisis     fallopian tube removed     Left   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   UPPER GASTROINTESTINAL ENDOSCOPY     [facility] teeth extraction      Family History  Problem Relation Age of Onset   Throat cancer Mother    Cancer - Other Mother 72       throat - died 29 months   Cancer Mother    Alcoholism Mother    Heart disease Father    Cancer Father    Breast cancer Maternal Aunt        breast   Colon cancer Maternal Uncle 81       died 36   Breast cancer Maternal Aunt        breast   Pancreatic cancer Maternal Aunt    Diabetes Other        grandmother   Hypertension Maternal Aunt  several family members   Throat cancer Maternal Uncle    Asthma Other        cousin, maternal   Breast cancer Maternal Aunt        total of 5 aunts   Breast cancer Maternal Aunt    Colon cancer Paternal Grandfather    Heart attack Neg Hx    Rectal cancer Neg Hx    Stomach cancer Neg Hx     Social History   Socioeconomic History   Marital status: Single    Spouse name: Not on file   Number of children: 0   Years of education: BS   Highest education level: Not on file  Occupational History   Occupation: Paramedic, going to school    Employer: [employer]   Occupation: Air traffic controller  Tobacco Use   Smoking status: Never   Smokeless tobacco: Never  Vaping Use   Vaping Use: Never used  Substance and Sexual Activity   Alcohol use: Yes    Alcohol/week: 0.0 standard drinks    Comment: socially - occasional    Drug use: No   Sexual activity: Yes    Birth control/protection: Inserts    Comment: nuvaring  Other Topics Concern   Not on file  Social History Narrative   Household:sister and her 3 kids    Drinks occasional starbucks drink       Social Determinants of Health   Financial  Resource Strain: Not on file  Food Insecurity: Not on file  Transportation Needs: Not on file  Physical Activity: Not on file  Stress: Not on file  Social Connections: Not on file  Intimate Partner Violence: Not on file    Outpatient Medications Prior to Visit  Medication Sig Dispense Refill   albuterol (PROAIR HFA) 108 (90 Base) MCG/ACT inhaler Inhale 2 puffs into the lungs every 6 (six) hours as needed for wheezing or shortness of breath. 18 g 2   bimatoprost (LATISSE) 0.03 % ophthalmic solution APPLY ALONG UPPER EYELID MARGIN AT BASE OF EYELASHES ONCE A DAY     cetirizine (ZYRTEC) 10 MG tablet TAKE 1 TABLET BY MOUTH DAILY 30 tablet 5   ESTROGEL 0.75 MG/1.25 GM (0.06%) topical gel Apply 1 application topically daily.  0   etonogestrel-ethinyl estradiol (NUVARING) 0.12-0.015 MG/24HR vaginal ring Place 1 each vaginally every 28 (twenty-eight) days. Insert vaginally and leave in place for 3 consecutive weeks, then remove for 1 week.     furosemide (LASIX) 20 MG tablet Take 1 tablet (20 mg total) by mouth daily. 5 tablet 0   ibuprofen (ADVIL) 800 MG tablet Take 1 tablet (800 mg total) by mouth every 6 (six) hours as needed. for pain 30 tablet 3   linaclotide (LINZESS) 72 MCG capsule Take 1 capsule (72 mcg total) by mouth daily before breakfast. 30 capsule 6   Liraglutide -Weight Management (SAXENDA) 18 MG/3ML SOPN 3 MG SUBCUTANEOUSLY DAILY 3 mL 2   Multiple Vitamin (MULTIVITAMIN) LIQD Take 5 mLs by mouth daily.     ondansetron (ZOFRAN ODT) 4 MG disintegrating tablet 4mg  ODT q4 hours prn nausea/vomit 20 tablet 0   ondansetron (ZOFRAN) 4 MG tablet Take 1 tablet (4 mg total) by mouth every 8 (eight) hours as needed for nausea or vomiting. 20 tablet 0   PREVIDENT 5000 SENSITIVE 1.1-5 % PSTE USE BEFORE BED DO NOT RINSE     promethazine-dextromethorphan (PROMETHAZINE-DM) 6.25-15 MG/5ML syrup Take 5 mLs by mouth 4 (four) times daily as needed.  118 mL 0   RESTASIS 0.05 % ophthalmic emulsion PLACE 1  DROP INTO BOTH EYES AS NEEDED. (Patient taking differently: Place 1 drop into both eyes daily as needed (dry eyes).) 60 mL 3   Ubrogepant ([name]) 50 MG TABS Take 50 mg by mouth 2 (two) times daily as needed (1 pill at onset of migraine, may repeat in 2 hours, no more than 2 pills in 24 hours.). 10 tablet 3   valACYclovir (VALTREX) 1000 MG tablet Take 1 tablet (1,000 mg total) by mouth 3 (three) times daily. 21 tablet 0   Vitamin D, Ergocalciferol, (DRISDOL) 1.25 MG (50000 UNIT) CAPS capsule Take 1 capsule (50,000 Units total) by mouth every 7 (seven) days. 12 capsule 1   No facility-administered medications prior to visit.    Allergies  Allergen Reactions   Acetaminophen-Codeine Itching   Hydrocodone Nausea Only   Oxycodone Itching   Codeine Itching and Nausea Only    Review of Systems  Musculoskeletal:  Positive for neck pain.  Skin:  Positive for rash (Neck).  Psychiatric/Behavioral:  The patient has insomnia (Due to pain from neck pain).       Objective:    Physical Exam Constitutional:      General: She is not in acute distress.    Appearance: Normal appearance. She is not ill-appearing.  HENT:     Head: Normocephalic and atraumatic.     Right Ear: External ear normal.     Left Ear: External ear normal.  Eyes:     General: No scleral icterus. Pulmonary:     Effort: Pulmonary effort is normal.     Breath sounds: No rhonchi or rales.  Skin:    Comments: Vesicular rash noted right neck, right shoulder in c4-c5 distribution.  Neurological:     Mental Status: She is alert and oriented to person, place, and time.  Psychiatric:        Behavior: Behavior normal.   BP 120/79 (BP Location: Right Arm, Patient Position: Sitting, Cuff Size: Small)   Pulse 88   Temp 98.6 F (37 C) (Oral)   Resp 16   Ht 5\' 3"  (1.6 m)   Wt 216 lb (98 kg)   LMP 10/17/2020   SpO2 99%   BMI 38.26 kg/m  Wt Readings from Last 3 Encounters:  11/12/20 216 lb (98 kg)  11/10/20 217 lb (98.4 kg)   11/09/20 215 lb 9.6 oz (97.8 kg)       Assessment & Plan:   Problem List Items Addressed This Visit       Unprioritized   Herpes zoster    New.  Advised pt to avoid being around immune compromised people, young children as it can cause chicken pox in others.  Recommended that she complete her valtrex rx.  She is having some pain sleeping. States she has an rx at home for gabapentin. Advised she can try 1 tab at bedtime as needed. We discussed shingrix vaccination after she has recovered. She wishes to get this vaccine.        21 minutes spent on today's visit. Time was spent reviewing ED record and counseling patient.   No orders of the defined types were placed in this encounter.   I, Debbrah Alar NP, personally preformed the services described in this documentation.  All medical record entries made by the scribe were at my direction and in my presence.  I have reviewed the chart and discharge instructions (if applicable) and agree that the record reflects  my personal performance and is accurate and complete. 11/12/2020   I,[name],acting as a Education administrator for Nance Pear, NP.,have documented all relevant documentation on the behalf of Nance Pear, NP,as directed by  Nance Pear, NP while in the presence of Nance Pear, NP.   Nance Pear, NP

## 2020-11-13 MED ORDER — GABAPENTIN 100 MG PO CAPS: 100.0000 mg | ORAL_CAPSULE | Freq: Every day | ORAL | 2 refills | Status: DC

## 2020-11-16 ENCOUNTER — Other Ambulatory Visit: Payer: Self-pay

## 2020-11-16 ENCOUNTER — Encounter: Payer: Self-pay | Admitting: Family Medicine

## 2020-11-16 ENCOUNTER — Ambulatory Visit (INDEPENDENT_AMBULATORY_CARE_PROVIDER_SITE_OTHER): Payer: BC Managed Care – PPO | Admitting: Family Medicine

## 2020-11-16 ENCOUNTER — Ambulatory Visit (INDEPENDENT_AMBULATORY_CARE_PROVIDER_SITE_OTHER): Payer: BC Managed Care – PPO

## 2020-11-16 VITALS — BP 109/64 | HR 87 | Temp 98.7°F | Resp 16 | Wt 217.0 lb

## 2020-11-16 DIAGNOSIS — E538 Deficiency of other specified B group vitamins: Secondary | ICD-10-CM

## 2020-11-16 DIAGNOSIS — B029 Zoster without complications: Secondary | ICD-10-CM

## 2020-11-16 MED ORDER — VALACYCLOVIR HCL 1 G PO TABS: 1000.0000 mg | ORAL_TABLET | Freq: Three times a day (TID) | ORAL | 0 refills | Status: DC

## 2020-11-16 MED ORDER — PREDNISONE 10 MG PO TABS: ORAL_TABLET | ORAL | 0 refills | Status: DC

## 2020-11-16 MED ORDER — GABAPENTIN 100 MG PO CAPS: ORAL_CAPSULE | ORAL | 1 refills | Status: DC

## 2020-11-16 MED ORDER — CYANOCOBALAMIN 1000 MCG/ML IJ SOLN
1000.0000 ug | Freq: Once | INTRAMUSCULAR | Status: AC
  Administered 2020-11-16: 1000 ug via INTRAMUSCULAR

## 2020-11-16 MED ORDER — METHYLPREDNISOLONE ACETATE 80 MG/ML IJ SUSP
80.0000 mg | Freq: Once | INTRAMUSCULAR | Status: AC
  Administered 2020-11-16: 80 mg via INTRAMUSCULAR

## 2020-11-16 MED ORDER — TRAMADOL HCL 50 MG PO TABS: 50.0000 mg | ORAL_TABLET | Freq: Three times a day (TID) | ORAL | 0 refills | Status: AC | PRN

## 2020-11-16 NOTE — Patient Instructions (Signed)
Shingles ?Shingles, which is also known as herpes zoster, is an infection that causes a painful skin rash and fluid-filled blisters. It is caused by a virus. ?Shingles only develops in people who: ?Have had chickenpox. ?Have been vaccinated against chickenpox. Shingles is rare in this group. ?What are the causes? ?Shingles is caused by varicella-zoster virus. This is the same virus that causes chickenpox. After a person is exposed to the virus, it stays in the body in an inactive (dormant) state. Shingles develops if the virus is reactivated. This can happen many years after the first (initial) exposure to the virus. It is not known what causes this virus to be reactivated. ?What increases the risk? ?People who have had chickenpox or received the chickenpox vaccine are at risk for shingles. Shingles infection is more common in people who: ?Are older than 52 years of age. ?Have a weakened disease-fighting system (immune system), such as people with: ?HIV (human immunodeficiency virus). ?AIDS (acquired immunodeficiency syndrome). ?Cancer. ?Are taking medicines that weaken the immune system, such as organ transplant medicines. ?Are experiencing a lot of stress. ?What are the signs or symptoms? ?Early symptoms of this condition include itching, tingling, and pain in an area on your skin. Pain may be described as burning, stabbing, or throbbing. ?A few days or weeks after early symptoms start, a painful red rash appears. The rash is usually on one side of the body and has a band-like or belt-like pattern. The rash eventually turns into fluid-filled blisters that break open, change into scabs, and dry up in about 2-3 weeks. ?At any time during the infection, you may also develop: ?A fever. ?Chills. ?A headache. ?Nausea. ?How is this diagnosed? ?This condition is diagnosed with a skin exam. Skin or fluid samples (a culture) may be taken from the blisters before a diagnosis is made. ?How is this treated? ?The rash may last  for several weeks. There is not a specific cure for this condition. Your health care provider may prescribe medicines to help you manage pain, recover more quickly, and avoid long-term problems. Medicines may include: ?Antiviral medicines. ?Anti-inflammatory medicines. ?Pain medicines. ?Anti-itching medicines (antihistamines). ?If the area involved is on your face, you may be referred to a specialist, such as an eye doctor (ophthalmologist) or an ear, nose, and throat (ENT) doctor (otorhinolaryngologist) to help you avoid eye problems, chronic pain, or disability. ?Follow these instructions at home: ?Medicines ?Take over-the-counter and prescription medicines only as told by your health care provider. ?Apply an anti-itch cream or numbing cream to the affected area as told by your health care provider. ?Relieving itching and discomfort ? ?Apply cold, wet cloths (cold compresses) to the area of the rash or blisters as told by your health care provider. ?Cool baths can be soothing. Try adding baking soda or dry oatmeal to the water to reduce itching. Do not bathe in hot water. ?Use calamine lotion as recommended by your health care provider. This is an over-the-counter lotion that helps to relieve itchiness. ?Blister and rash care ?Keep your rash covered with a loose bandage (dressing). Wear loose-fitting clothing to help ease the pain of material rubbing against the rash. ?Wash your hands with soap and water for at least 20 seconds before and after you change your dressing. If soap and water are not available, use hand sanitizer. ?Change your dressing as told by your health care provider. ?Keep your rash and blisters clean by washing the area with mild soap and cool water as told by your health   care provider. ?Check your rash every day for signs of infection. Check for: ?More redness, swelling, or pain. ?Fluid or blood. ?Warmth. ?Pus or a bad smell. ?Do not scratch your rash or pick at your blisters. To help avoid  scratching: ?Keep your fingernails clean and cut short. ?Wear gloves or mittens while you sleep, if scratching is a problem. ?General instructions ?Rest as told by your health care provider. ?Wash your hands often with soap and water for at least 20 seconds. If soap and water are not available, use hand sanitizer. Doing this lowers your chance of getting a bacterial skin infection. ?Before your blisters change into scabs, your shingles infection can cause chickenpox in people who have never had it or have never been vaccinated against it. To prevent this from happening, avoid contact with other people, especially: ?Babies. ?Pregnant women. ?Children who have eczema. ?Older people who have transplants. ?People who have chronic illnesses, such as cancer or AIDS. ?Keep all follow-up visits. This is important. ?How is this prevented? ?Getting vaccinated is the best way to prevent shingles and protect against shingles complications. If you have not been vaccinated, talk with your health care provider about getting the vaccine. ?Where to find more information ?Centers for Disease Control and Prevention: www.cdc.gov ?Contact a health care provider if: ?Your pain is not relieved with prescribed medicines. ?Your pain does not get better after the rash heals. ?You have any of these signs of infection: ?More redness, swelling, or pain around the rash. ?Fluid or blood coming from the rash. ?Warmth coming from your rash. ?Pus or a bad smell coming from the rash. ?A fever. ?Get help right away if: ?The rash is on your face or nose. ?You have facial pain, pain around your eye area, or loss of feeling on one side of your face. ?You have difficulty seeing. ?You have ear pain or have ringing in your ear. ?You have a loss of taste. ?Your condition gets worse. ?Summary ?Shingles, also known as herpes zoster, is an infection that causes a painful skin rash and fluid-filled blisters. ?This condition is diagnosed with a skin exam. Skin or  fluid samples (a culture) may be taken from the blisters. ?Keep your rash covered with a loose bandage (dressing). Wear loose-fitting clothing to help ease the pain of material rubbing against the rash. ?Before your blisters change into scabs, your shingles infection can cause chickenpox in people who have never had it or have never been vaccinated against it. ?This information is not intended to replace advice given to you by your health care provider. Make sure you discuss any questions you have with your health care provider. ?Document Revised: 05/17/2020 Document Reviewed: 05/17/2020 ?Elsevier Patient Education ? 2022 [employer] ? ?

## 2020-11-16 NOTE — Progress Notes (Addendum)
Subjective:   By signing my name below, I, [name], attest that this documentation has been prepared under the direction and in the presence of Dr. Roma Schanz, DO. 11/16/2020    Patient ID: Morgan Long, female    DOB: 01-23-1971, 50 y.o.   MRN: [id number]  Chief Complaint  Patient presents with   Neck Pain    Complains of pain on right side of neck and shoulder getting worse.    Herpes Zoster    Was diagnosed with shingles on right side of neck on 6-10, spreading to shoulder    HPI Patient is in today for a office visit. She complains of a constant painful rash on her neck and shoulder. The pain worsens at night. After her last visit on 11/09/2020, her niece noticed a rash on her. She went to the ED to manage the rash and was given 100 mg gabapentin daily PO to manage her pain but finds no relief. She also takes 1000 mg valtrex daily PO to manage her symptoms. She is willing try prednisone to manage her symptoms.   Past Medical History:  Diagnosis Date   Allergic rhinitis    Asthma    Chronic migraine    Dr. Catalina Gravel   Constipation    Dry eye syndrome of both lacrimal glands    Dry skin    Endometriosis    Fatigue    Gastroparesis    GERD (gastroesophageal reflux disease)    Headache(784.0)    occasional, dx w/ Migraines before, Topamax helps   Heartburn    Hepatic steatosis    History of stomach ulcers    Lactose intolerance    Localized edema    Migraine    Muscle weakness (generalized)    Nuclear cataract of both eyes    Mild   Overweight    Pain in right ankle and joints of right foot    Shortness of breath    Sinus complaint    Sinusitis    Tired    TMJ pain dysfunction syndrome    occasional    Past Surgical History:  Procedure Laterality Date   ANKLE SURGERY Left 11/16/2017   AUGMENTATION MAMMAPLASTY Bilateral 2006   BREAST ENHANCEMENT SURGERY  2006   BUNIONECTOMY     COLONOSCOPY     endrometroisis     fallopian tube removed      Left   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   UPPER GASTROINTESTINAL ENDOSCOPY     [facility] teeth extraction      Family History  Problem Relation Age of Onset   Throat cancer Mother    Cancer - Other Mother 37       throat - died 67 months   Cancer Mother    Alcoholism Mother    Heart disease Father    Cancer Father    Breast cancer Maternal Aunt        breast   Colon cancer Maternal Uncle 51       died 37   Breast cancer Maternal Aunt        breast   Pancreatic cancer Maternal Aunt    Diabetes Other        grandmother   Hypertension Maternal Aunt        several family members   Throat cancer Maternal Uncle    Asthma Other        cousin, maternal   Breast cancer Maternal  Aunt        total of 5 aunts   Breast cancer Maternal Aunt    Colon cancer Paternal Grandfather    Heart attack Neg Hx    Rectal cancer Neg Hx    Stomach cancer Neg Hx     Social History   Socioeconomic History   Marital status: Single    Spouse name: Not on file   Number of children: 0   Years of education: BS   Highest education level: Not on file  Occupational History   Occupation: Paramedic, going to school    Employer: [employer]   Occupation: Air traffic controller  Tobacco Use   Smoking status: Never   Smokeless tobacco: Never  Vaping Use   Vaping Use: Never used  Substance and Sexual Activity   Alcohol use: Yes    Alcohol/week: 0.0 standard drinks    Comment: socially - occasional    Drug use: No   Sexual activity: Yes    Birth control/protection: Inserts    Comment: nuvaring  Other Topics Concern   Not on file  Social History Narrative   Household:sister and her 3 kids    Drinks occasional starbucks drink       Social Determinants of Health   Financial Resource Strain: Not on file  Food Insecurity: Not on file  Transportation Needs: Not on file  Physical Activity: Not on file  Stress: Not on file  Social Connections:  Not on file  Intimate Partner Violence: Not on file    Outpatient Medications Prior to Visit  Medication Sig Dispense Refill   albuterol (PROAIR HFA) 108 (90 Base) MCG/ACT inhaler Inhale 2 puffs into the lungs every 6 (six) hours as needed for wheezing or shortness of breath. 18 g 2   bimatoprost (LATISSE) 0.03 % ophthalmic solution APPLY ALONG UPPER EYELID MARGIN AT BASE OF EYELASHES ONCE A DAY     cetirizine (ZYRTEC) 10 MG tablet TAKE 1 TABLET BY MOUTH DAILY 30 tablet 5   ESTROGEL 0.75 MG/1.25 GM (0.06%) topical gel Apply 1 application topically daily.  0   etonogestrel-ethinyl estradiol (NUVARING) 0.12-0.015 MG/24HR vaginal ring Place 1 each vaginally every 28 (twenty-eight) days. Insert vaginally and leave in place for 3 consecutive weeks, then remove for 1 week.     furosemide (LASIX) 20 MG tablet Take 1 tablet (20 mg total) by mouth daily. 5 tablet 0   ibuprofen (ADVIL) 800 MG tablet Take 1 tablet (800 mg total) by mouth every 6 (six) hours as needed. for pain 30 tablet 3   linaclotide (LINZESS) 72 MCG capsule Take 1 capsule (72 mcg total) by mouth daily before breakfast. 30 capsule 6   Liraglutide -Weight Management (SAXENDA) 18 MG/3ML SOPN 3 MG SUBCUTANEOUSLY DAILY 3 mL 2   Multiple Vitamin (MULTIVITAMIN) LIQD Take 5 mLs by mouth daily.     ondansetron (ZOFRAN ODT) 4 MG disintegrating tablet 4mg  ODT q4 hours prn nausea/vomit 20 tablet 0   ondansetron (ZOFRAN) 4 MG tablet Take 1 tablet (4 mg total) by mouth every 8 (eight) hours as needed for nausea or vomiting. 20 tablet 0   PREVIDENT 5000 SENSITIVE 1.1-5 % PSTE USE BEFORE BED DO NOT RINSE     promethazine-dextromethorphan (PROMETHAZINE-DM) 6.25-15 MG/5ML syrup Take 5 mLs by mouth 4 (four) times daily as needed. 118 mL 0   RESTASIS 0.05 % ophthalmic emulsion PLACE 1 DROP INTO BOTH EYES AS NEEDED. (Patient taking differently: Place 1 drop into both eyes daily  as needed (dry eyes).) 60 mL 3   Ubrogepant ([name]) 50 MG TABS Take 50 mg by  mouth 2 (two) times daily as needed (1 pill at onset of migraine, may repeat in 2 hours, no more than 2 pills in 24 hours.). 10 tablet 3   Vitamin D, Ergocalciferol, (DRISDOL) 1.25 MG (50000 UNIT) CAPS capsule Take 1 capsule (50,000 Units total) by mouth every 7 (seven) days. 12 capsule 1   gabapentin (NEURONTIN) 100 MG capsule Take 1 capsule (100 mg total) by mouth at bedtime. 30 capsule 2   valACYclovir (VALTREX) 1000 MG tablet Take 1 tablet (1,000 mg total) by mouth 3 (three) times daily. 21 tablet 0   No facility-administered medications prior to visit.    Allergies  Allergen Reactions   Acetaminophen-Codeine Itching   Hydrocodone Nausea Only   Oxycodone Itching   Codeine Itching and Nausea Only    Review of Systems  Constitutional:  Negative for fever and malaise/fatigue.  HENT:  Negative for congestion.   Eyes:  Negative for blurred vision.  Respiratory:  Negative for shortness of breath.   Cardiovascular:  Negative for chest pain, palpitations and leg swelling.  Gastrointestinal:  Negative for abdominal pain, blood in stool and nausea.  Genitourinary:  Negative for dysuria and frequency.  Musculoskeletal:  Positive for neck pain. Negative for falls.  Skin:  Negative for rash.  Neurological:  Negative for dizziness, loss of consciousness and headaches.  Endo/Heme/Allergies:  Negative for environmental allergies.  Psychiatric/Behavioral:  Negative for depression. The patient is not nervous/anxious.       Objective:    Physical Exam Vitals and nursing note reviewed.  Constitutional:      General: She is not in acute distress.    Appearance: Normal appearance. She is not ill-appearing.  HENT:     Head: Normocephalic and atraumatic.     Right Ear: External ear normal.     Left Ear: External ear normal.  Eyes:     Extraocular Movements: Extraocular movements intact.     Pupils: Pupils are equal, round, and reactive to light.  Cardiovascular:     Rate and Rhythm: Normal  rate and regular rhythm.     Pulses: Normal pulses.     Heart sounds: Normal heart sounds. No murmur heard.   No gallop.  Pulmonary:     Effort: Pulmonary effort is normal. No respiratory distress.     Breath sounds: Normal breath sounds. No wheezing, rhonchi or rales.  Musculoskeletal:     Cervical back: Neck supple.  Skin:    General: Skin is warm and dry.     Comments: Rash is crusted over and dry. No new lesions.  R side neck  C4-5 distribution   Neurological:     Mental Status: She is alert and oriented to person, place, and time.  Psychiatric:        Behavior: Behavior normal.    BP 109/64 (BP Location: Right Arm, Patient Position: Sitting, Cuff Size: Large)   Pulse 87   Temp 98.7 F (37.1 C) (Oral)   Resp 16   Wt 217 lb (98.4 kg)   LMP 10/17/2020   SpO2 97%   BMI 38.44 kg/m  Wt Readings from Last 3 Encounters:  11/16/20 217 lb (98.4 kg)  11/12/20 216 lb (98 kg)  11/10/20 217 lb (98.4 kg)    Diabetic Foot Exam - Simple   No data filed    Lab Results  Component Value Date   WBC 11.1 (H)  11/10/2020   HGB 13.7 11/10/2020   HCT 41.5 11/10/2020   PLT 292 11/10/2020   GLUCOSE 85 11/10/2020   CHOL 191 05/06/2018   TRIG 93 05/21/2019   HDL 65 05/06/2018   LDLCALC 104 (H) 05/06/2018   ALT 18 11/10/2020   AST 21 11/10/2020   NA 137 11/10/2020   K 3.7 11/10/2020   CL 105 11/10/2020   CREATININE 0.85 11/10/2020   BUN 14 11/10/2020   CO2 25 11/10/2020   TSH 0.98 11/02/2020   INR 1.0 12/15/2019   HGBA1C 5.9 (H) 05/06/2018    Lab Results  Component Value Date   TSH 0.98 11/02/2020   Lab Results  Component Value Date   WBC 11.1 (H) 11/10/2020   HGB 13.7 11/10/2020   HCT 41.5 11/10/2020   MCV 89.6 11/10/2020   PLT 292 11/10/2020   Lab Results  Component Value Date   NA 137 11/10/2020   K 3.7 11/10/2020   CO2 25 11/10/2020   GLUCOSE 85 11/10/2020   BUN 14 11/10/2020   CREATININE 0.85 11/10/2020   BILITOT 0.3 11/10/2020   ALKPHOS 69 11/10/2020    AST 21 11/10/2020   ALT 18 11/10/2020   PROT 7.6 11/10/2020   ALBUMIN 3.8 11/10/2020   CALCIUM 8.4 (L) 11/10/2020   ANIONGAP 7 11/10/2020   GFR 88.95 11/02/2020   Lab Results  Component Value Date   CHOL 191 05/06/2018   Lab Results  Component Value Date   HDL 65 05/06/2018   Lab Results  Component Value Date   LDLCALC 104 (H) 05/06/2018   Lab Results  Component Value Date   TRIG 93 05/21/2019   Lab Results  Component Value Date   CHOLHDL 3 05/14/2014   Lab Results  Component Value Date   HGBA1C 5.9 (H) 05/06/2018       Assessment & Plan:   Problem List Items Addressed This Visit       Unprioritized   Herpes zoster - Primary    Extend valtrex 3 more days for total 10 days Inc gabepentin  Ultram for pain  pred taper Depo medrol 80 mg IM  F/u prn        Relevant Medications   valACYclovir (VALTREX) 1000 MG tablet   gabapentin (NEURONTIN) 100 MG capsule   traMADol (ULTRAM) 50 MG tablet   predniSONE (DELTASONE) 10 MG tablet     Meds ordered this encounter  Medications   valACYclovir (VALTREX) 1000 MG tablet    Sig: Take 1 tablet (1,000 mg total) by mouth 3 (three) times daily.    Dispense:  9 tablet    Refill:  0   gabapentin (NEURONTIN) 100 MG capsule    Sig: 2 po tid    Dispense:  180 capsule    Refill:  1   traMADol (ULTRAM) 50 MG tablet    Sig: Take 1 tablet (50 mg total) by mouth every 8 (eight) hours as needed for up to 5 days.    Dispense:  15 tablet    Refill:  0   predniSONE (DELTASONE) 10 MG tablet    Sig: TAKE 3 TABLETS PO QD FOR 3 DAYS THEN TAKE 2 TABLETS PO QD FOR 3 DAYS THEN TAKE 1 TABLET PO QD FOR 3 DAYS THEN TAKE 1/2 TAB PO QD FOR 3 DAYS    Dispense:  20 tablet    Refill:  0   methylPREDNISolone acetate (DEPO-MEDROL) injection 80 mg    I, Dr. Roma Schanz, DO, personally preformed  the services described in this documentation.  All medical record entries made by the scribe were at my direction and in my presence.  I have  reviewed the chart and discharge instructions (if applicable) and agree that the record reflects my personal performance and is accurate and complete. 11/16/2020   I,[name],acting as a Education administrator for [employer], DO.,have documented all relevant documentation on the behalf of Morgan Held, DO,as directed by  Morgan Held, DO while in the presence of Morgan Held, DO.   Morgan Held, DO

## 2020-11-16 NOTE — Progress Notes (Signed)
Patient here today for b12 injection. 1,000 mc cyanocobalamin given in left deltoid IM. Patient tolerated well. Next injection due next week. Appointment has been scheduled.

## 2020-11-16 NOTE — Assessment & Plan Note (Signed)
Extend valtrex 3 more days for total 10 days Inc gabepentin  Ultram for pain  pred taper Depo medrol 80 mg IM  F/u prn

## 2020-11-16 NOTE — Telephone Encounter (Signed)
Dr. Etter Sjogren, this patient came in this morning for b12 injection nurse visit. At the appointment she did mention she sent this mychart message and wanted to ensure you received it. She did state she was in a great amount of pain in her neck and shoulders. She also is questioning shingles? She has rash/blisters however she states the blisters do not hurt.   Please see below message. I have scheduled her to see you this afternoon at 6:00 pm but she is wanting to make sure she actually needs to be seen or if you could address her message.

## 2020-11-23 ENCOUNTER — Other Ambulatory Visit: Payer: Self-pay

## 2020-11-23 ENCOUNTER — Ambulatory Visit (INDEPENDENT_AMBULATORY_CARE_PROVIDER_SITE_OTHER): Payer: BC Managed Care – PPO

## 2020-11-23 DIAGNOSIS — E538 Deficiency of other specified B group vitamins: Secondary | ICD-10-CM

## 2020-11-23 MED ORDER — CYANOCOBALAMIN 1000 MCG/ML IJ SOLN
1000.0000 ug | Freq: Once | INTRAMUSCULAR | Status: AC
  Administered 2020-11-23: 1000 ug via INTRAMUSCULAR

## 2020-11-23 NOTE — Progress Notes (Signed)
Morgan Long is a 50 y.o. female presents to the office today for weekly b12 injection per physician's orders. Original order: 11/02/2020 cyanocobalamin (med), 1,000 mch (dose),  IM (route) was administered left deltoid (location) today. Patient tolerated injection. Patient next injection due: next week, appt made Yes  Loleta Chance

## 2020-11-24 DIAGNOSIS — G43719 Chronic migraine without aura, intractable, without status migrainosus: Secondary | ICD-10-CM | POA: Diagnosis not present

## 2020-11-30 ENCOUNTER — Other Ambulatory Visit: Payer: Self-pay

## 2020-11-30 ENCOUNTER — Ambulatory Visit (INDEPENDENT_AMBULATORY_CARE_PROVIDER_SITE_OTHER): Payer: BC Managed Care – PPO

## 2020-11-30 DIAGNOSIS — E538 Deficiency of other specified B group vitamins: Secondary | ICD-10-CM | POA: Diagnosis not present

## 2020-11-30 MED ORDER — CYANOCOBALAMIN 1000 MCG/ML IJ SOLN
1000.0000 ug | Freq: Once | INTRAMUSCULAR | Status: AC
  Administered 2020-11-30: 1000 ug via INTRAMUSCULAR

## 2020-11-30 NOTE — Progress Notes (Signed)
Morgan Long is a 50 y.o. female presents to the office today for b12 injection per physician's orders. Original order: 11/09/2020 Cyanocobalamin (med), 1,000 mcg (dose),  IM (route) was administered left deltoid (location) today. Patient tolerated injection.   She states she was told she could do these injections at home? Please advise if ok. I will send supplies. Will she receive these injections monthly?   Also, please advise on when patient is to recheck labs.   Loleta Chance

## 2020-12-06 ENCOUNTER — Encounter: Payer: Self-pay | Admitting: Family Medicine

## 2020-12-07 MED ORDER — CYANOCOBALAMIN 1000 MCG/ML IJ SOLN: 1000.0000 ug | Freq: Once | INTRAMUSCULAR | 3 refills | Status: AC

## 2020-12-07 MED ORDER — "SYRINGE 22G X 1"" 3 ML MISC": 0 refills | Status: DC

## 2020-12-10 ENCOUNTER — Ambulatory Visit (INDEPENDENT_AMBULATORY_CARE_PROVIDER_SITE_OTHER): Payer: BC Managed Care – PPO | Admitting: Family Medicine

## 2020-12-10 ENCOUNTER — Other Ambulatory Visit: Payer: Self-pay

## 2020-12-10 ENCOUNTER — Encounter: Payer: Self-pay | Admitting: Family Medicine

## 2020-12-10 VITALS — BP 120/60 | HR 106 | Temp 99.1°F | Resp 18 | Ht 63.0 in | Wt 217.8 lb

## 2020-12-10 DIAGNOSIS — L232 Allergic contact dermatitis due to cosmetics: Secondary | ICD-10-CM | POA: Insufficient documentation

## 2020-12-10 DIAGNOSIS — J029 Acute pharyngitis, unspecified: Secondary | ICD-10-CM | POA: Diagnosis not present

## 2020-12-10 DIAGNOSIS — E559 Vitamin D deficiency, unspecified: Secondary | ICD-10-CM | POA: Diagnosis not present

## 2020-12-10 DIAGNOSIS — R011 Cardiac murmur, unspecified: Secondary | ICD-10-CM

## 2020-12-10 LAB — POCT RAPID STREP A (OFFICE): Rapid Strep A Screen: NEGATIVE

## 2020-12-10 MED ORDER — TRIAMCINOLONE ACETONIDE 0.1 % EX CREA: 1.0000 "application " | TOPICAL_CREAM | Freq: Two times a day (BID) | CUTANEOUS | 0 refills | Status: DC

## 2020-12-10 MED ORDER — VITAMIN D (ERGOCALCIFEROL) 1.25 MG (50000 UNIT) PO CAPS: 50000.0000 [IU] | ORAL_CAPSULE | ORAL | 0 refills | Status: DC

## 2020-12-10 MED ORDER — CEFDINIR 300 MG PO CAPS: 300.0000 mg | ORAL_CAPSULE | Freq: Two times a day (BID) | ORAL | 0 refills | Status: DC

## 2020-12-10 NOTE — Patient Instructions (Signed)
Pharyngitis  Pharyngitis is redness, pain, and swelling (inflammation) of the throat (pharynx). It is a very common cause of sore throat. Pharyngitis can be caused by a bacteria, but it is usually caused by a virus. Most cases of pharyngitis getbetter on their own without treatment. What are the causes? This condition may be caused by: Infection by viruses (viral). Viral pharyngitis spreads from person to person (is contagious) through coughing, sneezing, and sharing of personal items or utensils such as cups, forks, spoons, and toothbrushes. Infection by bacteria (bacterial). Bacterial pharyngitis may be spread by touching the nose or face after coming in contact with the bacteria, or through more intimate contact, such as kissing. Allergies. Allergies can cause buildup of mucus in the throat (post-nasal drip), leading to inflammation and irritation. Allergies can also cause blocked nasal passages, forcing breathing through the mouth, which dries and irritates the throat. What increases the risk? You are more likely to develop this condition if: You are 19-56 years old. You are exposed to crowded environments such as daycare, school, or dormitory living. You live in a cold climate. You have a weakened disease-fighting (immune) system. What are the signs or symptoms? Symptoms of this condition vary by the cause (viral, bacterial, or allergies) and can include: Sore throat. Fatigue. Low-grade fever. Headache. Joint pain and muscle aches. Skin rashes. Swollen glands in the throat (lymph nodes). Plaque-like film on the throat or tonsils. This is often a symptom of bacterial pharyngitis. Vomiting. Stuffy nose (nasal congestion). Cough. Red, itchy eyes (conjunctivitis). Loss of appetite. How is this diagnosed? This condition is often diagnosed based on your medical history and a physical exam. Your health care provider will ask you questions about your illness and your symptoms. A swab of  your throat may be done to check for bacteria (rapid strep test). Other lab tests may also be done, depending on the suspected cause, butthese are rare. How is this treated? This condition usually gets better in 3-4 days without medicine. Bacterialpharyngitis may be treated with antibiotic medicines. Follow these instructions at home: Take over-the-counter and prescription medicines only as told by your health care provider. If you were prescribed an antibiotic medicine, take it as told by your health care provider. Do not stop taking the antibiotic even if you start to feel better. Do not give children aspirin because of the association with Reye syndrome. Drink enough water and fluids to keep your urine clear or pale yellow. Get a lot of rest. Gargle with a salt-water mixture 3-4 times a day or as needed. To make a salt-water mixture, completely dissolve -1 tsp of salt in 1 cup of warm water. Do not swallow this mixture. If your health care provider approves, you may use throat lozenges or sprays to soothe your throat. Contact a health care provider if: You have large, tender lumps in your neck. You have a rash. You cough up green, yellow-brown, or bloody spit. Get help right away if: Your neck becomes stiff. You drool or are unable to swallow liquids. You cannot drink or take medicines without vomiting. You have severe pain that does not go away, even after you take medicine. You have trouble breathing, and it is not caused by a stuffy nose. You have new pain and swelling in your joints such as the knees, ankles, wrists, or elbows. Summary Pharyngitis is redness, pain, and swelling (inflammation) of the throat (pharynx). While pharyngitis can be caused by a bacteria, the most common causes are viral. Most cases  of pharyngitis get better on their own without treatment. Bacterial pharyngitis is treated with antibiotic medicines. This information is not intended to replace advice given  to you by your health care provider. Make sure you discuss any questions you have with your healthcare provider. Document Revised: 04/22/2020 Document Reviewed: 04/22/2020 Elsevier Patient Education  2022 [employer].

## 2020-12-10 NOTE — Assessment & Plan Note (Signed)
Triamcinolone sent to pharmacy

## 2020-12-10 NOTE — Assessment & Plan Note (Signed)
Pt will f/u cardiology Echo ordered

## 2020-12-10 NOTE — Progress Notes (Signed)
Patient ID: Morgan Long, female    DOB: Aug 25, 1970  Age: 50 y.o. MRN: [id number]    Subjective:  Subjective  HPI Morgan Long presents for an office visit today. She complains of swollen throat, dysphagia, and post nasal drainage. She notes that her throat was visibly swollen on her left side. She states that the sore throat had improved slightly over time. She notes that she may have come in contact with asymptomatic family member with COVID and her aunt's dog. She reports that she has more drainage than her baseline sinus infection.  She complains of rash on her bilateral inner thighs. She endorses using beta cream all over her thighs, however the rash is only generalize on her inner thighs. She notes that the area itches and she has been scratching the area.  She denies any chest pain, SOB, fever, abdominal pain, chills, dysuria, urinary incontinence, back pain, HA, or N/V/D at this time.   Review of Systems  Constitutional:  Negative for chills, fatigue and fever.  HENT:  Positive for postnasal drip, sore throat (swollen) and trouble swallowing. Negative for ear pain.   Eyes:  Negative for pain.  Respiratory:  Negative for shortness of breath and wheezing.   Cardiovascular:  Negative for chest pain.  Gastrointestinal:  Negative for abdominal pain, anal bleeding, constipation, diarrhea, nausea and vomiting.  Genitourinary:  Negative for flank pain.  Musculoskeletal:  Negative for back pain and neck pain.  Skin:  Positive for rash (inner thighs).  Neurological:  Negative for seizures, weakness, light-headedness, numbness and headaches.   History Past Medical History:  Diagnosis Date   Allergic rhinitis    Asthma    Chronic migraine    Dr. Catalina Gravel   Constipation    Dry eye syndrome of both lacrimal glands    Dry skin    Endometriosis    Fatigue    Gastroparesis    GERD (gastroesophageal reflux disease)    Headache(784.0)    occasional, dx w/ Migraines before, Topamax helps    Heartburn    Hepatic steatosis    History of stomach ulcers    Lactose intolerance    Localized edema    Migraine    Muscle weakness (generalized)    Nuclear cataract of both eyes    Mild   Overweight    Pain in right ankle and joints of right foot    Shortness of breath    Sinus complaint    Sinusitis    Tired    TMJ pain dysfunction syndrome    occasional    She has a past surgical history that includes fallopian tube removed; Breast enhancement surgery (2006); endrometroisis; Augmentation mammaplasty (Bilateral, 2006); wisdoim teeth extraction; Colonoscopy; Upper gastrointestinal endoscopy; Bunionectomy; Ankle surgery (Left, 11/16/2017); and Laparoscopic partial hepatectomy.   Her family history includes Alcoholism in her mother; Asthma in an other family member; Breast cancer in her maternal aunt, maternal aunt, maternal aunt, and maternal aunt; Cancer in her father and mother; Cancer - Other (age of onset: 41) in her mother; Colon cancer in her paternal grandfather; Colon cancer (age of onset: 70) in her maternal uncle; Diabetes in an other family member; Heart disease in her father; Hypertension in her maternal aunt; Pancreatic cancer in her maternal aunt; Throat cancer in her maternal uncle and mother.She reports that she has never smoked. She has never used smokeless tobacco. She reports current alcohol use. She reports that she does not use drugs.  Current Outpatient Medications on File  Prior to Visit  Medication Sig Dispense Refill   albuterol (PROAIR HFA) 108 (90 Base) MCG/ACT inhaler Inhale 2 puffs into the lungs every 6 (six) hours as needed for wheezing or shortness of breath. 18 g 2   bimatoprost (LATISSE) 0.03 % ophthalmic solution APPLY ALONG UPPER EYELID MARGIN AT BASE OF EYELASHES ONCE A DAY     cetirizine (ZYRTEC) 10 MG tablet TAKE 1 TABLET BY MOUTH DAILY 30 tablet 5   ESTROGEL 0.75 MG/1.25 GM (0.06%) topical gel Apply 1 application topically daily.  0    etonogestrel-ethinyl estradiol (NUVARING) 0.12-0.015 MG/24HR vaginal ring Place 1 each vaginally every 28 (twenty-eight) days. Insert vaginally and leave in place for 3 consecutive weeks, then remove for 1 week.     furosemide (LASIX) 20 MG tablet Take 1 tablet (20 mg total) by mouth daily. 5 tablet 0   gabapentin (NEURONTIN) 100 MG capsule 2 po tid 180 capsule 1   ibuprofen (ADVIL) 800 MG tablet Take 1 tablet (800 mg total) by mouth every 6 (six) hours as needed. for pain 30 tablet 3   linaclotide (LINZESS) 72 MCG capsule Take 1 capsule (72 mcg total) by mouth daily before breakfast. 30 capsule 6   Liraglutide -Weight Management (SAXENDA) 18 MG/3ML SOPN 3 MG SUBCUTANEOUSLY DAILY 3 mL 2   Multiple Vitamin (MULTIVITAMIN) LIQD Take 5 mLs by mouth daily.     ondansetron (ZOFRAN ODT) 4 MG disintegrating tablet 4mg  ODT q4 hours prn nausea/vomit 20 tablet 0   ondansetron (ZOFRAN) 4 MG tablet Take 1 tablet (4 mg total) by mouth every 8 (eight) hours as needed for nausea or vomiting. 20 tablet 0   predniSONE (DELTASONE) 10 MG tablet TAKE 3 TABLETS PO QD FOR 3 DAYS THEN TAKE 2 TABLETS PO QD FOR 3 DAYS THEN TAKE 1 TABLET PO QD FOR 3 DAYS THEN TAKE 1/2 TAB PO QD FOR 3 DAYS 20 tablet 0   PREVIDENT 5000 SENSITIVE 1.1-5 % PSTE USE BEFORE BED DO NOT RINSE     promethazine-dextromethorphan (PROMETHAZINE-DM) 6.25-15 MG/5ML syrup Take 5 mLs by mouth 4 (four) times daily as needed. 118 mL 0   RESTASIS 0.05 % ophthalmic emulsion PLACE 1 DROP INTO BOTH EYES AS NEEDED. (Patient taking differently: Place 1 drop into both eyes daily as needed (dry eyes).) 60 mL 3   Syringe/Needle, Disp, (SYRINGE 3CC/22GX1") 22G X 1" 3 ML MISC Use as directed to administer vitamin b12 injections monthly. 100 each 0   Ubrogepant ([name]) 50 MG TABS Take 50 mg by mouth 2 (two) times daily as needed (1 pill at onset of migraine, may repeat in 2 hours, no more than 2 pills in 24 hours.). 10 tablet 3   valACYclovir (VALTREX) 1000 MG tablet Take 1  tablet (1,000 mg total) by mouth 3 (three) times daily. 9 tablet 0   Vitamin D, Ergocalciferol, (DRISDOL) 1.25 MG (50000 UNIT) CAPS capsule Take 1 capsule (50,000 Units total) by mouth every 7 (seven) days. 12 capsule 1   No current facility-administered medications on file prior to visit.     Objective:  Objective  Physical Exam Vitals and nursing note reviewed.  Constitutional:      General: She is not in acute distress.    Appearance: Normal appearance. She is well-developed. She is not ill-appearing.  HENT:     Head: Normocephalic and atraumatic.     Right Ear: External ear normal.     Left Ear: External ear normal.     Nose: Nose normal.  Mouth/Throat:     Pharynx: Oropharyngeal exudate and posterior oropharyngeal erythema present.     Comments: There is erythema and exudate present on the L side of pharynx.  Eyes:     General:        Right eye: No discharge.        Left eye: No discharge.     Extraocular Movements: Extraocular movements intact.     Pupils: Pupils are equal, round, and reactive to light.  Cardiovascular:     Rate and Rhythm: Normal rate and regular rhythm.     Pulses: Normal pulses.     Heart sounds: Murmur heard.  Systolic murmur is present with a grade of 1/6.    No friction rub. No gallop.     Comments: There is grade 1-2 trace systolic heart murmur present.  Pulmonary:     Effort: Pulmonary effort is normal. No respiratory distress.     Breath sounds: Normal breath sounds. No stridor. No wheezing, rhonchi or rales.  Chest:     Chest wall: No tenderness.  Abdominal:     General: Bowel sounds are normal. There is no distension.     Palpations: Abdomen is soft. There is no mass.     Tenderness: There is no abdominal tenderness. There is no guarding or rebound.     Hernia: No hernia is present.  Musculoskeletal:        General: Normal range of motion.     Cervical back: Normal range of motion and neck supple.     Right lower leg: No edema.      Left lower leg: No edema.  Skin:    General: Skin is warm and dry.     Findings: Rash present. Rash is papular.     Comments: There is small papular rash present on the bilateral medial thigh.   Neurological:     Mental Status: She is alert and oriented to person, place, and time.  Psychiatric:        Behavior: Behavior normal.        Thought Content: Thought content normal.   BP 120/60 (BP Location: Right Arm, Patient Position: Sitting, Cuff Size: Large)   Pulse (!) 106   Temp 99.1 F (37.3 C) (Oral)   Resp 18   Ht 5\' 3"  (1.6 m)   Wt 217 lb 12.8 oz (98.8 kg)   SpO2 98%   BMI 38.58 kg/m  Wt Readings from Last 3 Encounters:  12/10/20 217 lb 12.8 oz (98.8 kg)  11/16/20 217 lb (98.4 kg)  11/12/20 216 lb (98 kg)     Lab Results  Component Value Date   WBC 11.1 (H) 11/10/2020   HGB 13.7 11/10/2020   HCT 41.5 11/10/2020   PLT 292 11/10/2020   GLUCOSE 85 11/10/2020   CHOL 191 05/06/2018   TRIG 93 05/21/2019   HDL 65 05/06/2018   LDLCALC 104 (H) 05/06/2018   ALT 18 11/10/2020   AST 21 11/10/2020   NA 137 11/10/2020   K 3.7 11/10/2020   CL 105 11/10/2020   CREATININE 0.85 11/10/2020   BUN 14 11/10/2020   CO2 25 11/10/2020   TSH 0.98 11/02/2020   INR 1.0 12/15/2019   HGBA1C 5.9 (H) 05/06/2018    DG Chest 2 View  Result Date: 11/10/2020 CLINICAL DATA:  Status post spider bite with rash and shortness of breath. EXAM: CHEST - 2 VIEW COMPARISON:  August 06, 2019 FINDINGS: The heart size and mediastinal contours are within normal  limits. Both lungs are clear. Ill-defined bilateral breast implants are seen. The visualized skeletal structures are unremarkable. IMPRESSION: No active cardiopulmonary disease. Electronically Signed   By: Virgina Norfolk M.D.   On: 11/10/2020 21:47     Assessment & Plan:  Plan   Meds ordered this encounter  Medications   triamcinolone cream (KENALOG) 0.1 %    Sig: Apply 1 application topically 2 (two) times daily.    Dispense:  30 g     Refill:  0   Vitamin D, Ergocalciferol, (DRISDOL) 1.25 MG (50000 UNIT) CAPS capsule    Sig: Take 1 capsule (50,000 Units total) by mouth every 7 (seven) days.    Dispense:  12 capsule    Refill:  0   cefdinir (OMNICEF) 300 MG capsule    Sig: Take 1 capsule (300 mg total) by mouth 2 (two) times daily.    Dispense:  14 capsule    Refill:  0    Problem List Items Addressed This Visit       Unprioritized   Allergic contact dermatitis due to cosmetics    Triamcinolone sent to pharmacy       Relevant Medications   triamcinolone cream (KENALOG) 0.1 %   Newly recognized murmur    Pt will f/u cardiology Echo ordered        Relevant Orders   ECHOCARDIOGRAM COMPLETE   Sore throat - Primary    Quick strep neg  But with errythema and exudate abx sent in  otc antihistamine  F/u prn        Relevant Medications   cefdinir (OMNICEF) 300 MG capsule   Other Relevant Orders   Novel Coronavirus, NAA ([facility])   POCT rapid strep A (Completed)   Other Visit Diagnoses     Vitamin D deficiency       Relevant Medications   Vitamin D, Ergocalciferol, (DRISDOL) 1.25 MG (50000 UNIT) CAPS capsule       Follow-up: Return if symptoms worsen or fail to improve.   I,Gordon Zheng,acting as a Education administrator for [employer], DO.,have documented all relevant documentation on the behalf of [name], DO,as directed by  [name], DO while in the presence of [place], DO, have reviewed all documentation for this visit. The documentation on 12/10/20 for the exam, diagnosis, procedures, and orders are all accurate and complete.

## 2020-12-10 NOTE — Assessment & Plan Note (Signed)
Quick strep neg  But with errythema and exudate abx sent in  otc antihistamine  F/u prn

## 2020-12-11 LAB — NOVEL CORONAVIRUS, NAA: SARS-CoV-2, NAA: NOT DETECTED

## 2020-12-11 LAB — SARS-COV-2, NAA 2 DAY TAT

## 2020-12-14 ENCOUNTER — Other Ambulatory Visit: Payer: BC Managed Care – PPO

## 2020-12-14 DIAGNOSIS — H2513 Age-related nuclear cataract, bilateral: Secondary | ICD-10-CM | POA: Diagnosis not present

## 2020-12-14 DIAGNOSIS — H16223 Keratoconjunctivitis sicca, not specified as Sjogren's, bilateral: Secondary | ICD-10-CM | POA: Diagnosis not present

## 2020-12-14 DIAGNOSIS — H04123 Dry eye syndrome of bilateral lacrimal glands: Secondary | ICD-10-CM | POA: Diagnosis not present

## 2020-12-14 DIAGNOSIS — R519 Headache, unspecified: Secondary | ICD-10-CM | POA: Diagnosis not present

## 2020-12-17 ENCOUNTER — Encounter: Payer: Self-pay | Admitting: Family Medicine

## 2020-12-22 ENCOUNTER — Other Ambulatory Visit (INDEPENDENT_AMBULATORY_CARE_PROVIDER_SITE_OTHER): Payer: BC Managed Care – PPO

## 2020-12-22 ENCOUNTER — Other Ambulatory Visit: Payer: Self-pay

## 2020-12-22 DIAGNOSIS — E539 Vitamin B deficiency, unspecified: Secondary | ICD-10-CM

## 2020-12-23 LAB — VITAMIN B12: Vitamin B-12: 649 pg/mL (ref 211–911)

## 2020-12-24 ENCOUNTER — Other Ambulatory Visit: Payer: BC Managed Care – PPO

## 2020-12-27 ENCOUNTER — Encounter: Payer: Self-pay | Admitting: Family Medicine

## 2020-12-30 ENCOUNTER — Telehealth: Payer: Self-pay

## 2020-12-30 ENCOUNTER — Encounter: Payer: BC Managed Care – PPO | Admitting: Cardiology

## 2020-12-30 ENCOUNTER — Other Ambulatory Visit: Payer: Self-pay

## 2020-12-30 ENCOUNTER — Encounter: Payer: Self-pay | Admitting: Cardiology

## 2020-12-30 VITALS — BP 125/80 | HR 84 | Ht 63.0 in | Wt 218.2 lb

## 2020-12-30 DIAGNOSIS — R0602 Shortness of breath: Secondary | ICD-10-CM

## 2020-12-30 DIAGNOSIS — R0609 Other forms of dyspnea: Secondary | ICD-10-CM | POA: Insufficient documentation

## 2020-12-30 DIAGNOSIS — I493 Ventricular premature depolarization: Secondary | ICD-10-CM

## 2020-12-30 DIAGNOSIS — Z006 Encounter for examination for normal comparison and control in clinical research program: Secondary | ICD-10-CM

## 2020-12-30 DIAGNOSIS — R0600 Dyspnea, unspecified: Secondary | ICD-10-CM

## 2020-12-30 NOTE — Progress Notes (Signed)
Erroneous encounter This encounter was created in error - please disregard. 

## 2020-12-30 NOTE — Progress Notes (Signed)
Cardiology Office Note:    Date:  12/30/2020   ID:  Morgan Long, DOB Sep 18, 1970, MRN [id number]  PCP:  Carollee Herter, Alferd Apa, DO  Cardiologist:  Jenean Lindau, MD   Referring MD: Carollee Herter, Alferd Apa, *    ASSESSMENT:    1. PVC (premature ventricular contraction)   2. Shortness of breath   3. Frequent PVCs   4. DOE (dyspnea on exertion)    PLAN:    In order of problems listed above:  Primary prevention stressed with the patient.  Importance of compliance with diet medication stressed and she vocalized understanding.  I told her to exercise at least half an hour a day and walk on a regular basis at least 5 days a week and she promises to do so. Frequent PVCs: This appears to have resolved.  Clinical auscultation did not reveal any skipped beats to suggest PVCs.  She is happy about it. Cardiac murmur: Primary care physician has heard a cardiac murmur and echocardiogram has been scheduled.  I was not able to appreciate any significant murmur. Obesity: Weight reduction was stressed diet and exercise emphasized. Patient will be seen in follow-up appointment in 6 months or earlier if the patient has any concerns    Medication Adjustments/Labs and Tests Ordered: Current medicines are reviewed at length with the patient today.  Concerns regarding medicines are outlined above.  Orders Placed This Encounter  Procedures   Basic metabolic panel    No orders of the defined types were placed in this encounter.    No chief complaint on file.    History of Present Illness:    Morgan Long is a 50 y.o. female.  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  She thought she was seeing Dr. Bettina Gavia and came here for follow-up.  She requested to be seen by me as Dr. Bettina Gavia was not here and his office hours were in [place] today.  Patient denies any chest pain orthopnea or PND.  She mentions to me that she is feeling better she is just getting out of  breath on exertion but that is gradually improving.  She does not exercise because of problems with her ankle but that has also gotten better and she is now going to begin an exercise program.  Overall she leads a sedentary lifestyle.  I checked with her about sexual activity to assess symptoms on exertion but she tells me that she is not sexually active at this time.  At the time of my evaluation, the patient is alert awake oriented and in no distress.  Past Medical History:  Diagnosis Date   Allergic rhinitis    Asthma    Chronic migraine    Dr. Catalina Gravel   Constipation    Dry eye syndrome of both lacrimal glands    Dry skin    Endometriosis    Fatigue    Gastroparesis    GERD (gastroesophageal reflux disease)    Headache(784.0)    occasional, dx w/ Migraines before, Topamax helps   Heartburn    Hepatic steatosis    History of stomach ulcers    Lactose intolerance    Localized edema    Migraine    Muscle weakness (generalized)    Nuclear cataract of both eyes    Mild   Overweight    Pain in right ankle and joints of right foot    Shortness of breath    Sinus complaint  Sinusitis    Tired    TMJ pain dysfunction syndrome    occasional    Past Surgical History:  Procedure Laterality Date   ANKLE SURGERY Left 11/16/2017   AUGMENTATION MAMMAPLASTY Bilateral 2006   BREAST ENHANCEMENT SURGERY  2006   BUNIONECTOMY     COLONOSCOPY     endrometroisis     fallopian tube removed     Left   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   bimatoprost (LATISSE) 0.03 % ophthalmic solution APPLY ALONG UPPER EYELID MARGIN AT BASE OF EYELASHES ONCE A DAY   cetirizine (ZYRTEC) 10 MG  tablet TAKE 1 TABLET BY MOUTH DAILY   cyanocobalamin (,VITAMIN B-12,) 1000 MCG/ML injection Inject 1,000 mcg into the muscle every 30 (thirty) days.   ESTROGEL 0.75 MG/1.25 GM (0.06%) topical gel Apply 1 application topically daily.   etonogestrel-ethinyl estradiol (NUVARING) 0.12-0.015 MG/24HR vaginal ring Place 1 each vaginally every 28 (twenty-eight) days. Insert vaginally and leave in place for 3 consecutive weeks, then remove for 1 week.   linaclotide (LINZESS) 145 MCG CAPS capsule Take 1 capsule by mouth daily as needed for constipation.   linaclotide (LINZESS) 72 MCG capsule Take 1 capsule (72 mcg total) by mouth daily before breakfast.   Liraglutide -Weight Management (SAXENDA) 18 MG/3ML SOPN 3 MG SUBCUTANEOUSLY DAILY   Multiple Vitamin (MULTIVITAMIN) LIQD Take 5 mLs by mouth daily.   ondansetron (ZOFRAN ODT) 4 MG disintegrating tablet '4mg'$  ODT q4 hours prn nausea/vomit   PREVIDENT 5000 SENSITIVE 1.1-5 % PSTE USE BEFORE BED DO NOT RINSE   promethazine-dextromethorphan (PROMETHAZINE-DM) 6.25-15 MG/5ML syrup Take 5 mLs by mouth 4 (four) times daily as needed.   Syringe/Needle, Disp, (SYRINGE 3CC/22GX1") 22G X 1" 3 ML MISC Use as directed to administer vitamin b12 injections monthly.   tretinoin (RETIN-A) 0.1 % cream 1 application every evening.   Ubrogepant ([name]) 50 MG TABS Take 50 mg by mouth 2 (two) times daily as needed (1 pill at onset of migraine, may repeat in 2 hours, no more than 2 pills in 24 hours.).   Vitamin D, Ergocalciferol, (DRISDOL) 1.25 MG (50000 UNIT) CAPS capsule Take 1 capsule (50,000 Units total) by mouth every 7 (seven) days.     Allergies:   Acetaminophen-codeine, Hydrocodone, Oxycodone, Tyloxapol, and Codeine   Social History   Socioeconomic History   Marital status: Single    Spouse name: Not on file   Number of children: 0   Years of education: BS   Highest education level: Not on file  Occupational History   Occupation: Paramedic, going to  school    Employer: [employer]   Occupation: Air traffic controller  Tobacco Use   Smoking status: Never   Smokeless tobacco: Never  Vaping Use   Vaping Use: Never used  Substance and Sexual Activity   Alcohol use: Yes    Alcohol/week: 0.0 standard drinks    Comment: socially - occasional    Drug use: No   Sexual activity: Yes    Birth control/protection: Inserts    Comment: nuvaring  Other Topics Concern   Not on file  Social History Narrative  Household:sister and her 3 kids    Drinks occasional starbucks drink       Social Determinants of Radio broadcast assistant Strain: Not on file  Food Insecurity: Not on file  Transportation Needs: Not on file  Physical Activity: Not on file  Stress: Not on file  Social Connections: Not on file     Family History: The patient's family history includes Alcoholism in her mother; Asthma in an other family member; Breast cancer in her maternal aunt, maternal aunt, maternal aunt, and maternal aunt; Cancer in her father and mother; Cancer - Other (age of onset: 66) in her mother; Colon cancer in her paternal grandfather; Colon cancer (age of onset: 29) in her maternal uncle; Diabetes in an other family member; Heart disease in her father; Hypertension in her maternal aunt; Pancreatic cancer in her maternal aunt; Throat cancer in her maternal uncle and mother. There is no history of Heart attack, Rectal cancer, or Stomach cancer.  ROS:   Please see the history of present illness.    All other systems reviewed and are negative.  EKGs/Labs/Other Studies Reviewed:    The following studies were reviewed today:   IMPRESSION: 1. Calcium Score 0   2.  Normal aortic root 2.9 cm \   3.  CAD RADS 0 normal right dominant coronary arteries   Jenkins Rouge   Electronically Signed: By: Jenkins Rouge M.D. On: 09/21/2020 14:10   Recent Labs: 11/02/2020: TSH 0.98 11/10/2020: ALT 18; BUN 14; Creatinine, Ser 0.85; Hemoglobin 13.7; Platelets  292; Potassium 3.7; Sodium 137  Recent Lipid Panel    Component Value Date/Time   CHOL 191 05/06/2018 1421   TRIG 93 05/21/2019 2145   HDL 65 05/06/2018 1421   CHOLHDL 3 05/14/2014 1223   VLDL 12.2 05/14/2014 1223   LDLCALC 104 (H) 05/06/2018 1421    Physical Exam:    VS:  BP 125/80 (BP Location: Left Arm, Patient Position: Sitting, Cuff Size: Large)   Pulse 84   Ht '5\' 3"'$  (1.6 m)   Wt 218 lb 3.2 oz (99 kg)   BMI 38.65 kg/m     Wt Readings from Last 3 Encounters:  12/30/20 218 lb 3.2 oz (99 kg)  12/10/20 217 lb 12.8 oz (98.8 kg)  11/16/20 217 lb (98.4 kg)     GEN: Patient is in no acute distress HEENT: Normal NECK: No JVD; No carotid bruits LYMPHATICS: No lymphadenopathy CARDIAC: Hear sounds regular, 2/6 systolic murmur at the apex. RESPIRATORY:  Clear to auscultation without rales, wheezing or rhonchi  ABDOMEN: Soft, non-tender, non-distended MUSCULOSKELETAL:  No edema; No deformity  SKIN: Warm and dry NEUROLOGIC:  Alert and oriented x 3 PSYCHIATRIC:  Normal affect   Signed, Jenean Lindau, MD  12/30/2020 4:07 PM    [facility]

## 2020-12-30 NOTE — Patient Instructions (Signed)
Medication Instructions:  No medication changes. *If you need a refill on your cardiac medications before your next appointment, please call your pharmacy*   Lab Work: Your physician recommends that you have a BMET done today.  If you have labs (blood work) drawn today and your tests are completely normal, you will receive your results only by: [place] (if you have MyChart) OR A paper copy in the mail If you have any lab test that is abnormal or we need to change your treatment, we will call you to review the results.   Testing/Procedures: None ordered   Follow-Up: At [facility], you and your health needs are our priority.  As part of our continuing mission to provide you with exceptional heart care, we have created designated Provider Care Teams.  These Care Teams include your primary Cardiologist (physician) and Advanced Practice Providers (APPs -  Physician Assistants and Nurse Practitioners) who all work together to provide you with the care you need, when you need it.  We recommend signing up for the patient portal called "MyChart".  Sign up information is provided on this After Visit Summary.  MyChart is used to connect with patients for Virtual Visits (Telemedicine).  Patients are able to view lab/test results, encounter notes, upcoming appointments, etc.  Non-urgent messages can be sent to your provider as well.   To learn more about what you can do with MyChart, go to [email or web address].    Your next appointment:   6 month(s)  The format for your next appointment:   In Person  Provider:   [name] More, MD   Other Instructions NA

## 2020-12-30 NOTE — Telephone Encounter (Signed)
I called patient for her 90-day Identify Study follow up phone call. Patient stated she feels slightly better but is still having shortness of breath with exertion. Pt had an appointment with her Cardiologist today. I reminded patient I would call her in April for her 1 year follow-up.

## 2020-12-31 LAB — BASIC METABOLIC PANEL
BUN/Creatinine Ratio: 16 (ref 9–23)
BUN: 12 mg/dL (ref 6–24)
CO2: 20 mmol/L (ref 20–29)
Calcium: 8.9 mg/dL (ref 8.7–10.2)
Chloride: 100 mmol/L (ref 96–106)
Creatinine, Ser: 0.77 mg/dL (ref 0.57–1.00)
Glucose: 77 mg/dL (ref 65–99)
Potassium: 4.3 mmol/L (ref 3.5–5.2)
Sodium: 133 mmol/L — ABNORMAL LOW (ref 134–144)
eGFR: 93 mL/min/{1.73_m2} (ref >59)

## 2021-01-03 DIAGNOSIS — L219 Seborrheic dermatitis, unspecified: Secondary | ICD-10-CM | POA: Diagnosis not present

## 2021-01-03 DIAGNOSIS — L658 Other specified nonscarring hair loss: Secondary | ICD-10-CM | POA: Diagnosis not present

## 2021-01-04 DIAGNOSIS — D1803 Hemangioma of intra-abdominal structures: Secondary | ICD-10-CM | POA: Diagnosis not present

## 2021-01-04 DIAGNOSIS — K7689 Other specified diseases of liver: Secondary | ICD-10-CM | POA: Diagnosis not present

## 2021-01-04 DIAGNOSIS — Z9889 Other specified postprocedural states: Secondary | ICD-10-CM | POA: Diagnosis not present

## 2021-01-04 DIAGNOSIS — D134 Benign neoplasm of liver: Secondary | ICD-10-CM | POA: Diagnosis not present

## 2021-01-06 ENCOUNTER — Other Ambulatory Visit: Payer: Self-pay

## 2021-01-06 ENCOUNTER — Ambulatory Visit (HOSPITAL_BASED_OUTPATIENT_CLINIC_OR_DEPARTMENT_OTHER)
Admission: RE | Admit: 2021-01-06 | Discharge: 2021-01-06 | Disposition: A | Discharge status: Home / Self Care | Payer: BC Managed Care – PPO | Source: Ambulatory Visit | Attending: Family Medicine | Admitting: Family Medicine

## 2021-01-06 DIAGNOSIS — R011 Cardiac murmur, unspecified: Secondary | ICD-10-CM | POA: Insufficient documentation

## 2021-01-06 LAB — ECHOCARDIOGRAM COMPLETE
AR max vel: 2.03 cm2
AV Area VTI: 1.98 cm2
AV Area mean vel: 1.99 cm2
AV Mean grad: 5 mmHg
AV Peak grad: 9.2 mmHg
Ao pk vel: 1.52 m/s
Area-P 1/2: 4.44 cm2
Calc EF: 54.3 %
MV M vel: 4.61 m/s
MV Peak grad: 85 mmHg
S' Lateral: 1.81 cm
Single Plane A2C EF: 49.6 %
Single Plane A4C EF: 57.2 %

## 2021-01-06 NOTE — Progress Notes (Signed)
*  PRELIMINARY RESULTS* Echocardiogram 2D Echocardiogram has been performed.  Luisa Hart RDCS 01/06/2021, 11:13 AM

## 2021-01-11 ENCOUNTER — Ambulatory Visit: Payer: BC Managed Care – PPO | Admitting: Neurology

## 2021-01-11 ENCOUNTER — Encounter: Payer: Self-pay | Admitting: Neurology

## 2021-01-11 VITALS — BP 119/78 | HR 76 | Ht 63.0 in | Wt 213.2 lb

## 2021-01-11 DIAGNOSIS — G43719 Chronic migraine without aura, intractable, without status migrainosus: Secondary | ICD-10-CM

## 2021-01-11 MED ORDER — ONABOTULINUMTOXINA 100 UNITS IJ SOLR
200.0000 [IU] | Freq: Once | INTRAMUSCULAR | Status: AC
  Administered 2021-01-11: 200 [IU] via INTRAMUSCULAR

## 2021-01-11 NOTE — Progress Notes (Signed)
Subjective:    Patient ID: Morgan Long is a 50 y.o. female.  HPI  Morgan Long is a 50 year old right-handed woman with an underlying medical history of allergic rhinitis, endometriosis, TMJ problems, Reflux disease, gastroparesis, hepatic congestion with steatosis, s/p partial hepatectomy for hepatic tumor, lactose intolerance, neck pain, and obesity, who presents for her 13th Botox injection for her intractable migraines.  The patient is unaccompanied today. She had her 12th injection on 10/11/2020, at which time she reported doing well, latest injection had worked well.   Today, 01/11/21: she reports having done well.  Last injection worked well, she continues to benefit from the injections.    Written informed consent for recurrent, 3 monthly intramuscular injections with botulinum toxin for this indication has previously been obtained and scanned into the patient's electronic chart. I will re-consent if the type of botulinum toxin used or the indication for injection changes for this patient in the future. The patient is informed that we will use the same consent for her recurrent, most likely 3 monthly injections. She demonstrated understanding and voiced agreement.   I have previously talked to the patient in detail about expectations, limitations, benefits as well as potential adverse effects of botulinum toxin injections. The patient understands that the side effects include (but are not limited to): Mouth dryness, dryness of eyes, speech and swallowing difficulties, respiratory depression or problems breathing, weakness of muscles including more distant muscles than the ones injected, flu-like symptoms, myalgias, injection site reactions such as redness, itching, swelling, pain, and infection.   200 units of botulinum toxin type A were reconstituted using preservative-free normal saline to a concentration of 10 units per 0.1 mL and drawn up into 1 mL tuberculin syringes. Lot number was [id number]  [id number], expiration date January 2025 for 1 200 unit vial, buy and bill.   O/E: BP 119/78   Pulse 76   Ht 5' 3"  (1.6 m)   Wt 213 lb 3.2 oz (96.7 kg)   BMI 37.77 kg/m       The patient was situated in a chair, sitting comfortably. After preparing the areas with 70% isopropyl alcohol and using a 26 gauge 1 1/2 inch hollow lumen recording EMG needle for the neck injections as well as a 30 gauge 1 inch needle for the facial injections, a total dose of 155 units of botulinum toxin type A in the form of Botox was injected into the muscles and the following distribution and quantities:   #1: 10 units on the right and 10 units in the left frontalis muscles, broken down in 2 sites on each side. #2: 5 units in the right and 5 units in the left corrugator muscles. #3: 15 units in the right and 15 units in the left occipitalis muscles, broken down in 3 sites on each side. #4: 20 units in the right and 20 units in the left temporalis muscles, broken down in 4 sites on each side. #5: 15 units on the right and 15 units in the left upper trapezius muscles, broken down in 3 sites on each side. #6: 10 units in the right and 10 units in the left splenius capitis muscles, broken down in 2 sites on each side. #7: 2.5 units in the right and 2.5 units in the left procerus muscles.   EMG guidance was utilized for the neck injections with mild EMG activity noted, especially in the splenius capitis muscles bilaterally, right little more noticeable than left.    A dose  of 45 units out of a total dose of 200 units was discarded as unavoidable waste.   The patient tolerated the procedure well without immediate complications. She was advised to make a followup appointment for repeat injections in 3 months from now and encouraged to call us with any interim questions, concerns, problems, or updates. She was in agreement and did not have any questions prior to leaving clinic today.   Previously (copied from previous notes  for reference):   She had her 10th injection on 03/31/2020, at which time she reported, she had done well.   She had an interim injection with [name] on 07/12/2020.     She missed an injection in between and had her ninth injection with [name], nurse practitioner on 12/29/2019.   She had her 8th injection on 05/08/2019 at which time she reported that she had not taken the [name].  She was unsure if she had any refills.  She presented with a migraine.  She tolerated the Botox injection reasonably well.       I saw her on 01/30/2019 for her seventh injection, at which time she reported that the Botox injections were very effective and that the new medication [name] was more helpful for acute use than her triptan before.   I saw her on 10/24/2018 for her 6th injection, at which time she had noticed a recent flareup of her migraines.  She did not think that the [name] had helped that much.  She was a little overdue for her Botox injection at the time.   I saw her on 07/17/2018 for her fifth injection, at which time she reported Botox had been helpful, she reported overall very good results, no side effects, her migraines had reduced by at least 7 days a month, she also reported reduction by at least 100 hours/month of her migraine headaches.  She did report that she had received cosmetic Botox injection in her eyebrow area.   I saw her on 04/10/2018 for her 4th injection, at which time she reported good results with her Botox injections in significant reduction in her headache frequency and severely overall. She denied any side effects. She was overdue by nearly 2 weeks for her injection at the time.   I saw her on 12/26/2017 for her 3rd injection, at which time she reported having had a recent increase in her headache frequency, she had recent foot surgery.   I saw her on 09/25/2017 for her second round of injections. She felt that in the week prior to the injection her headaches were more  intense.   I saw her on 06/28/2017 for her initial Botox injection, at which time she received 155 units of Botox.   She was seen by her primary care physician and received a Toradol injection on 06/19/2017 as well as a prescription for Relpax.   04/25/17: Dr. Melton Alar retired. She reports a long-standing history of migraine headaches, she recalls she was about 50 years old when she started having migraines. For quite some time she had spontaneous improvement in her migraines but some 4 or 5 years ago started having more frequent migraines. She has tried and failed multiple preventative medications but does not have a list with her and prior records from Dr. Audery Amel office are not available today. She reports that his office is closed at this point. She did not get her records from his office. Perhaps her primary care provider has records. I encouraged her to try to obtain  those records as it will help [name] manage her migraines optimally. She recalls having tried Topamax. She had hair loss from some medications but is not completely sure which ones. Topamax did not help, Zonegran caused her sedation, nortriptyline was also tried but she is unsure what happened with it. Amitriptyline she may have tried as well. She did not try Depakote. She is worried about sedation side effects as she also is going to school and has to be alert throughout the day. She takes occasional nausea medicine which likely is Zofran under the tongue. She has tried Maxalt under the tongue which is helpful at times. She needs a refill on that. She has regular eye exams because of her history of dry eyes. She used to wear a single contact but was advised to stop using it. She has had 2 rounds of Botox injections, last time about 3 months ago as she recalls. She remembers doing okay with it. Prior to starting Botox injections she reports having about 18-20 headache days per month typically. She does not always have a typical aura but  sometimes has visual blurriness and bringing in her ears to warn her about an upcoming migraine headache. She has occasional nausea that precedes the headache.   I saw her on 06/09/2015 at which time she was referred is a new patient referral for a new problem, referred by her optometrist at the time for 1 month history of blurry vision. Her exam at the time was nonfocal and reassuringly she had no significant eye related findings. I suggested further workup in the form of visual evoked potentials, blood work, and she also reported a 6 month history of feeling tired. She had no one-sided weakness, tingling or numbness. Symptoms from the past which included paresthesias had resolved completely. She was under the care of Dr. Melton Alar for migraines. She was in the process of titrating Zonegran. She was on 225 mg each night. When she took 300 mg each night she felt too sleepy during the day. She denied any symptoms of sleep disordered breathing. She did admit not being a good sleeper. She did endorse stress as she was in school online for psychology and was also working off-and-on in Personal assistant. She reported not drinking sodas, not drinking alcohol and she reported not smoking. She did report pain with eye movements and dry eyes. She reported no family history of multiple sclerosis or lupus. She denied joint pain with the exception of bilateral knee pain and she also reported a 50 pound weight gain in the last year. She had visual evoked potentials on 07/06/2015: Impression: The visual evoked response test above was within normal limits bilaterally. No evidence of conduction slowing was seen within the anterior visual pathways on either side on today's evaluation.   We called her with her test results. Labs from 06/09/2015 showed normal A1c, normal vitamin D level, normal ANA, normal RF, normal ESR. CRP was elevated at 15.6. We called with her test results and advised her that CRP elevation is typically nonspecific  and an indicator of inflammation or arthritis or infection, could be from her osteoarthritis of her knees as well.   She had a brain MRI with and without contrast on 06/23/2015: IMPRESSION:  This is a normal MRI of the brain with and without contrast In addition, personally reviewed the images through the PACS system. We called her with her test results.   I first met her on 05/22/2014 at the request of her neurosurgeon, Dr.  Nundkumar, at which time the patient reported intermittent right arm numbness, particularly with neck position changes. I suggested blood work and EMG and nerve conduction testing of the right upper extremity. Her blood work showed elevated B12 and B6 levels, indicative of B vitamin supplementation. Hemoglobin A1c was 5.7. We called her with her test results. She had EMG and nerve conduction testing on 06/02/2014: IMPRESSION:   Nerve conduction studies done on both upper extremities were unremarkable, without evidence of a neuropathy seen. EMG evaluation of the right upper extremity was unremarkable, without evidence of an overlying cervical radiculopathy. We called her with her test results. At the time, she reported improved symptoms.   05/22/2014: She has intermittent right arm numbness. Her symptoms have been going on for about 6 months. She has not noted any permanent numbness and no issues elsewhere. She does not have any significant weakness and sometimes feels weak when the numbness seems to come on. It goes away if she changes positions or adjusts her neck position. She has had right shoulder problems and pain in the right shoulder.   You saw her on 05/14/2014 for neck pain. She had undergone physical therapy without improvement of her neck pain. She had cervical epidural steroid injections which helped for about 24 hours as understand.    She had a C-spine MRI without contrast on 01/07/2014: Mild cervical spondylosis as described above without significant disc protrusion,  foraminal stenosis or central canal stenosis.   Blood work from 05/14/2014 was reviewed: She had a BMP, CBC with differential, liver function panel, lipid panel and TSH all of which were fine with the exception of a borderline LDL of 111.  Review of Systems  Neurological:        BOTOX. Botox- 200 units x 1 vial Lot: [id number] Expiration: 06/2023 NDC: 5427-0623-76  Bacteriostatic 0.9% Sodium Chloride- 22m total Lot: [id number]: 06/05/2022 NDC: [address]-10 Dx: [id number] B/B

## 2021-01-11 NOTE — Patient Instructions (Signed)
Please remember, botulinum toxin takes about 3-7 days to kick in. As discussed, this is not a pain shot. The purpose of the injections is to gradually improve your symptoms. In some patients it takes up to 2-3 weeks to make a difference and it wears off with time. Sometimes it may wear off before it is time for the next injection. We still should wait till the next 3 monthly injection, because injecting too frequently may cause you to develop immunity to the botulinum toxin. We are looking for a reduction in the severity and/or frequency of your symptoms. As a reminder, side effects to look out for are (but not limited to): mouth dryness, dryness of the eyes, heaviness of your head or muscle weakness, including droopy face or droopy eyelid(s), rarely: speech or swallowing difficulties and very rarely: breathing difficulties. Some people have transient neck pain or soreness which typically responds to over-the-counter anti-inflammatory medication and local heat application with a heat pad. If you think you have a severe reaction to the botulinum toxin, such as weakness, trouble speaking, trouble breathing, or trouble swallowing, you have to call 911 or have someone take you to the nearest emergency room. However, most people have either no or minimal side effects from the injections. It is normal to have a little bit of redness and swelling around the injection sites which usually improves after a few hours. Rarely, there may be a bruise that improves on its own. Most side effects reported are very mild and resolve within 10-14 days. Please feel free to call us if you have any additional questions or concerns: [phone] or email us through My Chart. We may have to adjust the dose over time, depending on your results from this injection and your overall response over time to this medication.   

## 2021-01-19 ENCOUNTER — Telehealth: Payer: Self-pay | Admitting: *Deleted

## 2021-01-19 ENCOUNTER — Encounter: Payer: Self-pay | Admitting: *Deleted

## 2021-01-19 NOTE — Telephone Encounter (Signed)
[name] PA, keyHX:[id number]. Continuation of therapy. Your information has been submitted to [place]. [employer] [place] will review the request and notify you of the determination decision directly, typically within 72 hours of receiving all information. If [employer] [place] has not responded within the specified time frame or if you have any questions about your PA submission, contact Winner Halma directly at [phone].

## 2021-01-19 NOTE — Telephone Encounter (Signed)
[name] approved Effective from 01/19/2021 through 01/18/2022. Sent my chart and informed patient.

## 2021-01-20 ENCOUNTER — Encounter: Payer: Self-pay | Admitting: Family Medicine

## 2021-01-27 ENCOUNTER — Other Ambulatory Visit: Payer: Self-pay

## 2021-01-27 ENCOUNTER — Ambulatory Visit (INDEPENDENT_AMBULATORY_CARE_PROVIDER_SITE_OTHER): Payer: BC Managed Care – PPO

## 2021-01-27 DIAGNOSIS — Z0289 Encounter for other administrative examinations: Secondary | ICD-10-CM

## 2021-01-27 DIAGNOSIS — Z23 Encounter for immunization: Secondary | ICD-10-CM

## 2021-01-27 NOTE — Progress Notes (Signed)
Patient here for Shingrix #1 of 2 per Provider's order on patient messagr dated 01-24-21.

## 2021-02-01 ENCOUNTER — Other Ambulatory Visit: Payer: Self-pay | Admitting: Family Medicine

## 2021-02-01 DIAGNOSIS — R112 Nausea with vomiting, unspecified: Secondary | ICD-10-CM

## 2021-02-02 ENCOUNTER — Other Ambulatory Visit: Payer: Self-pay

## 2021-02-02 ENCOUNTER — Ambulatory Visit (INDEPENDENT_AMBULATORY_CARE_PROVIDER_SITE_OTHER): Payer: BC Managed Care – PPO | Admitting: Family Medicine

## 2021-02-02 ENCOUNTER — Encounter (INDEPENDENT_AMBULATORY_CARE_PROVIDER_SITE_OTHER): Payer: Self-pay | Admitting: Family Medicine

## 2021-02-02 VITALS — BP 113/69 | HR 79 | Temp 98.1°F | Ht 63.0 in | Wt 206.0 lb

## 2021-02-02 DIAGNOSIS — E538 Deficiency of other specified B group vitamins: Secondary | ICD-10-CM | POA: Diagnosis not present

## 2021-02-02 DIAGNOSIS — Z1331 Encounter for screening for depression: Secondary | ICD-10-CM

## 2021-02-02 DIAGNOSIS — R7303 Prediabetes: Secondary | ICD-10-CM | POA: Diagnosis not present

## 2021-02-02 DIAGNOSIS — E559 Vitamin D deficiency, unspecified: Secondary | ICD-10-CM | POA: Diagnosis not present

## 2021-02-02 DIAGNOSIS — R5383 Other fatigue: Secondary | ICD-10-CM | POA: Diagnosis not present

## 2021-02-02 DIAGNOSIS — R0602 Shortness of breath: Secondary | ICD-10-CM | POA: Diagnosis not present

## 2021-02-02 DIAGNOSIS — Z6836 Body mass index (BMI) 36.0-36.9, adult: Secondary | ICD-10-CM

## 2021-02-02 MED ORDER — SAXENDA 18 MG/3ML ~~LOC~~ SOPN
3.0000 mg | PEN_INJECTOR | Freq: Every day | SUBCUTANEOUS | 0 refills | Status: DC
Start: 2021-02-02 — End: 2021-02-16

## 2021-02-02 MED ORDER — BD PEN NEEDLE NANO U/F 32G X 4 MM MISC: 0 refills | Status: DC

## 2021-02-02 NOTE — Progress Notes (Signed)
Chief Complaint:   OBESITY Morgan Long (MR# [id number]) is a 50 y.o. female who presents for evaluation and treatment of obesity and related comorbidities. Current BMI is Body mass index is 36.49 kg/m. Morgan Long has been struggling with her weight for many years and has been unsuccessful in either losing weight, maintaining weight loss, or reaching her healthy weight goal.  Morgan Long is a returning patient to our clinic. She recently was started on Saxenda and she has increased her dose to 1.8 mg. She is also on phentermine and her RMR has decreased which is likely due to decreasing her nutrition too low.  Morgan Long is currently in the action stage of change and ready to dedicate time achieving and maintaining a healthier weight. Morgan Long is interested in becoming our patient and working on intensive lifestyle modifications including (but not limited to) diet and exercise for weight loss.  Morgan Long's habits were reviewed today and are as follows: she struggles with family and or coworkers weight loss sabotage, her desired weight loss is 56 lbs, she started gaining weight after ankle injury, her heaviest weight ever was 235 pounds, she is a picky eater and doesn't like to eat healthier foods, she has significant food cravings issues, she snacks frequently in the evenings, she skips meals frequently, she is frequently drinking liquids with calories, and she frequently eats larger portions than normal.  Depression Screen Morgan Long's Food and Mood (modified PHQ-9) score was 15.  Depression screen Morgan Long  Decreased Interest 3  Down, Depressed, Hopeless 1  PHQ - 2 Score 4  Altered sleeping 1  Tired, decreased energy 3  Change in appetite 2  Feeling bad or failure about yourself  2  Trouble concentrating 2  Moving slowly or fidgety/restless 1  Suicidal thoughts 0  PHQ-9 Score 15  Difficult doing work/chores Somewhat difficult  Some recent data might be hidden   Subjective:   1. Other  fatigue Morgan Long admits to daytime somnolence and admits to waking up still tired. Patent has a history of symptoms of daytime fatigue and morning headache. Morgan Long generally gets 6 or 7 hours of sleep per night, and states that she has difficulty falling asleep. Snoring is present. Apneic episodes are present. Epworth Sleepiness Score is 3.  2. SOB (shortness of breath) on exertion Morgan Long notes increasing shortness of breath with exercising and seems to be worsening over time with weight gain. She notes getting out of breath sooner with activity than she used to. This has not gotten worse recently. Morgan Long denies shortness of breath at rest or orthopnea.  3. Pre-diabetes Morgan Long is working on diet and exercise. She has started back on a GLP-1 on her own.  4. Vitamin D deficiency Morgan Long is on Vit D, and she has no recent labs.  5. Vitamin B12 deficiency Morgan Long is on B12 supplementation, and she notes fatigue.  Assessment/Plan:   1. Other fatigue Morgan Long does feel that her weight is causing her energy to be lower than it should be. Fatigue may be related to obesity, depression or many other causes. Labs will be ordered, and in the meanwhile, Morgan Long will focus on self care including making healthy food choices, increasing physical activity and focusing on stress reduction.  - TSH - Lipid Panel With LDL/HDL Ratio - Folate - T3 - CBC With Differential - EKG 12-Lead - T4, free  2. SOB (shortness of breath) on exertion Morgan Long does feel that she gets out of breath more easily that she  used to when she exercises. Morgan Long's shortness of breath appears to be obesity related and exercise induced. She has agreed to work on weight loss and gradually increase exercise to treat her exercise induced shortness of breath. Will continue to monitor closely.  3. Pre-diabetes Morgan Long will start her Category 2 plan. She will continue Saxenda, diet, exercise, and decreasing simple carbohydrates to help decrease the risk of diabetes. We  will check labs today.  - Insulin, random - Comprehensive metabolic panel - Hemoglobin A1c  4. Vitamin D deficiency Low Vitamin D level contributes to fatigue and are associated with obesity, breast, and colon cancer. We will check labs today. Morgan Long will follow-up for routine testing of Vitamin D, at least 2-3 times per year to avoid over-replacement.  - VITAMIN D 25 Hydroxy (Vit-D Deficiency, Fractures)  5. Vitamin B12 deficiency The diagnosis was reviewed with the patient. We will check labs today. Morgan Long will continue to follow up as directed. Orders and follow up as documented in patient record.  - Vitamin B12  6. Screening for depression Morgan Long had a positive depression screening. Depression is commonly associated with obesity and often results in emotional eating behaviors. We will monitor this closely and work on CBT to help improve the non-hunger eating patterns. Referral to Psychology may be required if no improvement is seen as she continues in our clinic.  7. Obesity with current BMI 36.5 Morgan Long is currently in the action stage of change and her goal is to continue with weight loss efforts. I recommend Morgan Long begin the structured treatment plan as follows:  She has agreed to the Category 2 Plan.  We discussed various medication options to help Morgan Long with her weight loss efforts and we both agreed to hold phentermine, and she is ok to continue Saxenda at 1.8 mg. (We will refill Saxenda at 3.0 mg #5 with no refills, and nano needles #100 with no refills).  - Liraglutide -Weight Management (SAXENDA) 18 MG/3ML SOPN; Inject 3 mg into the skin daily. Start at 0.6 daily  Dispense: 15 mL; Refill: 0 - Insulin Pen Needle (BD PEN NEEDLE NANO U/F) 32G X 4 MM MISC; Use Nano needle with Ozempic  Dispense: 100 each; Refill: 0  Exercise goals: No exercise has been prescribed for now, while concentrate on nutritional changes.  Behavioral modification strategies: increasing lean protein intake and meal  planning and cooking strategies.  She was informed of the importance of frequent follow-up visits to maximize her success with intensive lifestyle modifications for her multiple health conditions. She was informed we would discuss her lab results at her next visit unless there is a critical issue that needs to be addressed sooner. Morgan Long agreed to keep her next visit at the agreed upon time to discuss these results.  Objective:   Blood pressure 113/69, pulse 79, temperature 98.1 F (36.7 C), height '5\' 3"'$  (1.6 m), weight 206 lb (93.4 kg), SpO2 98 %. Body mass index is 36.49 kg/m.  EKG: Normal sinus rhythm, rate 84 BPM.  Indirect Calorimeter completed today shows a VO2 of 230 and a REE of 1584.  Her calculated basal metabolic rate is [id number] thus her basal metabolic rate is worse than expected.  General: Cooperative, alert, well developed, in no acute distress. HEENT: Conjunctivae and lids unremarkable. Cardiovascular: Regular rhythm.  Lungs: Normal work of breathing. Neurologic: No focal deficits.   Lab Results  Component Value Date   CREATININE 0.77 12/30/2020   BUN 12 12/30/2020   NA 133 (L) 12/30/2020  K 4.3 12/30/2020   CL 100 12/30/2020   CO2 20 12/30/2020   Lab Results  Component Value Date   ALT 18 11/10/2020   AST 21 11/10/2020   ALKPHOS 69 11/10/2020   BILITOT 0.3 11/10/2020   Lab Results  Component Value Date   HGBA1C 5.9 (H) 05/06/2018   HGBA1C 5.8 (H) 01/23/2018   HGBA1C 5.5 08/14/2017   HGBA1C 5.6 06/09/2015   HGBA1C 5.7 (H) 05/22/2014   Lab Results  Component Value Date   INSULIN 11.2 05/06/2018   INSULIN 8.4 01/23/2018   INSULIN 8.0 08/14/2017   Lab Results  Component Value Date   TSH 0.98 11/02/2020   Lab Results  Component Value Date   CHOL 191 05/06/2018   HDL 65 05/06/2018   LDLCALC 104 (H) 05/06/2018   TRIG 93 05/21/2019   CHOLHDL 3 05/14/2014   Lab Results  Component Value Date   WBC 11.1 (H) 11/10/2020   HGB 13.7 11/10/2020   HCT 41.5  11/10/2020   MCV 89.6 11/10/2020   PLT 292 11/10/2020   Lab Results  Component Value Date   FERRITIN 169 05/21/2019   Attestation Statements:   Reviewed by clinician on day of visit: allergies, medications, problem list, medical history, surgical history, family history, social history, and previous encounter notes.  Time spent on visit including pre-visit chart review and post-visit charting and care was 66 minutes.    I, Trixie Dredge, am acting as transcriptionist for Dennard Nip, MD.  I have reviewed the above documentation for accuracy and completeness, and I agree with the above. - Dennard Nip, MD

## 2021-02-02 NOTE — Addendum Note (Signed)
Addended by: Dennard Nip D on: 02/02/2021 03:48 PM   Modules accepted: Level of Service

## 2021-02-03 LAB — COMPREHENSIVE METABOLIC PANEL
ALT: 16 IU/L (ref 0–32)
AST: 19 IU/L (ref 0–40)
Albumin/Globulin Ratio: 1.4 (ref 1.2–2.2)
Albumin: 4 g/dL (ref 3.8–4.9)
Alkaline Phosphatase: 86 IU/L (ref 44–121)
BUN/Creatinine Ratio: 18 (ref 9–23)
BUN: 15 mg/dL (ref 6–24)
Bilirubin Total: 0.3 mg/dL (ref 0.0–1.2)
CO2: 20 mmol/L (ref 20–29)
Calcium: 8.5 mg/dL — ABNORMAL LOW (ref 8.7–10.2)
Chloride: 101 mmol/L (ref 96–106)
Creatinine, Ser: 0.83 mg/dL (ref 0.57–1.00)
Globulin, Total: 2.8 g/dL (ref 1.5–4.5)
Glucose: 74 mg/dL (ref 65–99)
Potassium: 4.3 mmol/L (ref 3.5–5.2)
Sodium: 136 mmol/L (ref 134–144)
Total Protein: 6.8 g/dL (ref 6.0–8.5)
eGFR: 85 mL/min/{1.73_m2} (ref >59)

## 2021-02-03 LAB — CBC WITH DIFFERENTIAL
Basophils Absolute: 0.1 10*3/uL (ref 0.0–0.2)
Basos: 1 %
EOS (ABSOLUTE): 0.1 10*3/uL (ref 0.0–0.4)
Eos: 1 %
Hematocrit: 39.6 % (ref 34.0–46.6)
Hemoglobin: 12.9 g/dL (ref 11.1–15.9)
Immature Grans (Abs): 0.1 10*3/uL (ref 0.0–0.1)
Immature Granulocytes: 1 %
Lymphocytes Absolute: 1.9 10*3/uL (ref 0.7–3.1)
Lymphs: 21 %
MCH: 29.5 pg (ref 26.6–33.0)
MCHC: 32.6 g/dL (ref 31.5–35.7)
MCV: 91 fL (ref 79–97)
Monocytes Absolute: 0.8 10*3/uL (ref 0.1–0.9)
Monocytes: 8 %
Neutrophils Absolute: 6.4 10*3/uL (ref 1.4–7.0)
Neutrophils: 68 %
RBC: 4.37 x10E6/uL (ref 3.77–5.28)
RDW: 13.1 % (ref 11.7–15.4)
WBC: 9.3 10*3/uL (ref 3.4–10.8)

## 2021-02-03 LAB — LIPID PANEL WITH LDL/HDL RATIO
Cholesterol, Total: 201 mg/dL — ABNORMAL HIGH (ref 100–199)
HDL: 71 mg/dL (ref >39)
LDL Chol Calc (NIH): 114 mg/dL — ABNORMAL HIGH (ref 0–99)
LDL/HDL Ratio: 1.6 ratio (ref 0.0–3.2)
Triglycerides: 91 mg/dL (ref 0–149)
VLDL Cholesterol Cal: 16 mg/dL (ref 5–40)

## 2021-02-03 LAB — T3: T3, Total: 167 ng/dL (ref 71–180)

## 2021-02-03 LAB — FOLATE: Folate: 6 ng/mL (ref >3.0)

## 2021-02-03 LAB — INSULIN, RANDOM: INSULIN: 12.8 u[IU]/mL (ref 2.6–24.9)

## 2021-02-03 LAB — TSH: TSH: 1.59 u[IU]/mL (ref 0.450–4.500)

## 2021-02-03 LAB — HEMOGLOBIN A1C
Est. average glucose Bld gHb Est-mCnc: 123 mg/dL
Hgb A1c MFr Bld: 5.9 % — ABNORMAL HIGH (ref 4.8–5.6)

## 2021-02-03 LAB — VITAMIN B12: Vitamin B-12: 673 pg/mL (ref 232–1245)

## 2021-02-03 LAB — VITAMIN D 25 HYDROXY (VIT D DEFICIENCY, FRACTURES): Vit D, 25-Hydroxy: 44.8 ng/mL (ref 30.0–100.0)

## 2021-02-03 LAB — T4, FREE: Free T4: 1.21 ng/dL (ref 0.82–1.77)

## 2021-02-04 DIAGNOSIS — M25572 Pain in left ankle and joints of left foot: Secondary | ICD-10-CM | POA: Diagnosis not present

## 2021-02-09 DIAGNOSIS — M25572 Pain in left ankle and joints of left foot: Secondary | ICD-10-CM | POA: Diagnosis not present

## 2021-02-16 ENCOUNTER — Encounter (INDEPENDENT_AMBULATORY_CARE_PROVIDER_SITE_OTHER): Payer: Self-pay | Admitting: Family Medicine

## 2021-02-16 ENCOUNTER — Other Ambulatory Visit: Payer: Self-pay

## 2021-02-16 ENCOUNTER — Ambulatory Visit (INDEPENDENT_AMBULATORY_CARE_PROVIDER_SITE_OTHER): Payer: BC Managed Care – PPO | Admitting: Family Medicine

## 2021-02-16 VITALS — BP 119/71 | HR 78 | Temp 98.5°F | Ht 63.0 in | Wt 208.0 lb

## 2021-02-16 DIAGNOSIS — Z6836 Body mass index (BMI) 36.0-36.9, adult: Secondary | ICD-10-CM | POA: Diagnosis not present

## 2021-02-16 DIAGNOSIS — R7303 Prediabetes: Secondary | ICD-10-CM

## 2021-02-16 DIAGNOSIS — Z9189 Other specified personal risk factors, not elsewhere classified: Secondary | ICD-10-CM | POA: Diagnosis not present

## 2021-02-16 DIAGNOSIS — E559 Vitamin D deficiency, unspecified: Secondary | ICD-10-CM | POA: Diagnosis not present

## 2021-02-16 MED ORDER — SAXENDA 18 MG/3ML ~~LOC~~ SOPN: 3.0000 mg | PEN_INJECTOR | Freq: Every day | SUBCUTANEOUS | 0 refills | Status: AC

## 2021-02-16 MED ORDER — VITAMIN D (ERGOCALCIFEROL) 1.25 MG (50000 UNIT) PO CAPS: 50000.0000 [IU] | ORAL_CAPSULE | ORAL | 0 refills | Status: DC

## 2021-02-17 ENCOUNTER — Encounter: Payer: Self-pay | Admitting: Family Medicine

## 2021-02-17 ENCOUNTER — Other Ambulatory Visit: Payer: Self-pay

## 2021-02-17 ENCOUNTER — Encounter (HOSPITAL_BASED_OUTPATIENT_CLINIC_OR_DEPARTMENT_OTHER): Payer: Self-pay | Admitting: *Deleted

## 2021-02-17 ENCOUNTER — Emergency Department (HOSPITAL_BASED_OUTPATIENT_CLINIC_OR_DEPARTMENT_OTHER)
Admission: EM | Admit: 2021-02-17 | Discharge: 2021-02-18 | Disposition: A | Discharge status: Home / Self Care | Payer: BC Managed Care – PPO | Attending: Emergency Medicine | Admitting: Emergency Medicine

## 2021-02-17 DIAGNOSIS — Z794 Long term (current) use of insulin: Secondary | ICD-10-CM | POA: Diagnosis not present

## 2021-02-17 DIAGNOSIS — Z8616 Personal history of COVID-19: Secondary | ICD-10-CM | POA: Diagnosis not present

## 2021-02-17 DIAGNOSIS — J45909 Unspecified asthma, uncomplicated: Secondary | ICD-10-CM | POA: Diagnosis not present

## 2021-02-17 DIAGNOSIS — R42 Dizziness and giddiness: Secondary | ICD-10-CM | POA: Diagnosis not present

## 2021-02-17 DIAGNOSIS — G44209 Tension-type headache, unspecified, not intractable: Secondary | ICD-10-CM | POA: Diagnosis not present

## 2021-02-17 MED ORDER — LACTATED RINGERS IV BOLUS
1000.0000 mL | Freq: Once | INTRAVENOUS | Status: AC
  Administered 2021-02-17: 1000 mL via INTRAVENOUS

## 2021-02-17 MED ORDER — KETOROLAC TROMETHAMINE 15 MG/ML IJ SOLN
15.0000 mg | Freq: Once | INTRAMUSCULAR | Status: AC
  Administered 2021-02-17: 15 mg via INTRAVENOUS
  Filled 2021-02-17: qty 1

## 2021-02-17 MED ORDER — DIPHENHYDRAMINE HCL 50 MG/ML IJ SOLN
25.0000 mg | Freq: Once | INTRAMUSCULAR | Status: AC
  Administered 2021-02-17: 25 mg via INTRAVENOUS
  Filled 2021-02-17: qty 1

## 2021-02-17 MED ORDER — METOCLOPRAMIDE HCL 5 MG/ML IJ SOLN
10.0000 mg | Freq: Once | INTRAMUSCULAR | Status: AC
  Administered 2021-02-17: 10 mg via INTRAVENOUS
  Filled 2021-02-17: qty 2

## 2021-02-17 NOTE — Progress Notes (Signed)
Chief Complaint:   OBESITY Morgan Long is here to discuss her progress with her obesity treatment plan along with follow-up of her obesity related diagnoses. Morgan Long is on the Category 2 Plan and states she is following her eating plan approximately 60% of the time. Morgan Long states she is walking, leg lifts and abs for 60 minutes 7 times per week.  Today's visit was #: 2 Starting weight: 206 lbs Starting date: 02/02/2021 Today's weight: 208 lbs Today's date: 02/16/2021 Total lbs lost to date: 0 Total lbs lost since last in-office visit: 0  Interim History: Morgan Long is struggling with following on her plan. She is especially challenged with dinner.  Subjective:   1. Vitamin D deficiency Morgan Long on Vit D prescription, and her level is improving slowly but it is not yet at goal. I discussed labs with the patient today.  2. Pre-diabetes Morgan Long has been on Saxenda, but she is now out. Her A1c is 5.9, and she notes PM polyphagia. She would like to restart Saxenda. I discussed labs with the patient today.  3. At risk for diabetes mellitus Morgan Long is at higher than average risk for developing diabetes due to obesity.   Assessment/Plan:   1. Vitamin D deficiency Low Vitamin D level contributes to fatigue and are associated with obesity, breast, and colon cancer. We will refill prescription Vitamin D 50,000 IU every week for 1 month. Morgan Long will follow-up for routine testing of Vitamin D, at least 2-3 times per year to avoid over-replacement.  - Vitamin D, Ergocalciferol, (DRISDOL) 1.25 MG (50000 UNIT) CAPS capsule; Take 1 capsule (50,000 Units total) by mouth every 7 (seven) days.  Dispense: 4 capsule; Refill: 0  2. Pre-diabetes Morgan Long will restart Saxenda, and will continue with diet, exercise, and decreasing simple carbohydrates to help decrease the risk of diabetes. We will follow up at her next visit.  3. At risk for diabetes mellitus Morgan Long was given approximately 30 minutes of diabetes education and  counseling today. We discussed intensive lifestyle modifications today with an emphasis on weight loss as well as increasing exercise and decreasing simple carbohydrates in her diet. We also reviewed medication options with an emphasis on risk versus benefit of those discussed.   Repetitive spaced learning was employed today to elicit superior memory formation and behavioral change.  4. Obesity with current BMI 37.0 Morgan Long is currently in the action stage of change. As such, her goal is to continue with weight loss efforts. She has agreed to the Category 2 Plan.   We discussed various medication options to help Morgan Long with her weight loss efforts and we both agreed to restart Saxenda 3.0 mg with no refills (patient to start back at 1.8 mg for now).  - Liraglutide -Weight Management (SAXENDA) 18 MG/3ML SOPN; Inject 3 mg into the skin daily. Start at 1.8 daily.  Dispense: 15 mL; Refill: 0  Exercise goals: As is.  Behavioral modification strategies: increasing lean protein intake and no skipping meals.  Morgan Long has agreed to follow-up with our clinic in 2 weeks. She was informed of the importance of frequent follow-up visits to maximize her success with intensive lifestyle modifications for her multiple health conditions.   Objective:   Blood pressure 119/71, pulse 78, temperature 98.5 F (36.9 C), height '5\' 3"'$  (1.6 m), weight 208 lb (94.3 kg), SpO2 100 %. Body mass index is 36.85 kg/m.  General: Cooperative, alert, well developed, in no acute distress. HEENT: Conjunctivae and lids unremarkable. Cardiovascular: Regular rhythm.  Lungs: Normal  work of breathing. Neurologic: No focal deficits.   Lab Results  Component Value Date   CREATININE 0.83 02/02/2021   BUN 15 02/02/2021   NA 136 02/02/2021   K 4.3 02/02/2021   CL 101 02/02/2021   CO2 20 02/02/2021   Lab Results  Component Value Date   ALT 16 02/02/2021   AST 19 02/02/2021   ALKPHOS 86 02/02/2021   BILITOT 0.3 02/02/2021   Lab  Results  Component Value Date   HGBA1C 5.9 (H) 02/02/2021   HGBA1C 5.9 (H) 05/06/2018   HGBA1C 5.8 (H) 01/23/2018   HGBA1C 5.5 08/14/2017   HGBA1C 5.6 06/09/2015   Lab Results  Component Value Date   INSULIN 12.8 02/02/2021   INSULIN 11.2 05/06/2018   INSULIN 8.4 01/23/2018   INSULIN 8.0 08/14/2017   Lab Results  Component Value Date   TSH 1.590 02/02/2021   Lab Results  Component Value Date   CHOL 201 (H) 02/02/2021   HDL 71 02/02/2021   LDLCALC 114 (H) 02/02/2021   TRIG 91 02/02/2021   CHOLHDL 3 05/14/2014   Lab Results  Component Value Date   VD25OH 44.8 02/02/2021   VD25OH 35.92 11/02/2020   VD25OH 34.0 05/06/2018   Lab Results  Component Value Date   WBC 9.3 02/02/2021   HGB 12.9 02/02/2021   HCT 39.6 02/02/2021   MCV 91 02/02/2021   PLT 292 11/10/2020   Lab Results  Component Value Date   FERRITIN 169 05/21/2019   Attestation Statements:   Reviewed by clinician on day of visit: allergies, medications, problem list, medical history, surgical history, family history, social history, and previous encounter notes.   I, Trixie Dredge, am acting as transcriptionist for Dennard Nip, MD.  I have reviewed the above documentation for accuracy and completeness, and I agree with the above. -  Dennard Nip, MD

## 2021-02-17 NOTE — ED Triage Notes (Signed)
Hypertension and headache. Lightheaded yesterday. Nausea. Hx of migraines. She took Tramadol with no relief.

## 2021-02-17 NOTE — ED Provider Notes (Signed)
[facility] EMERGENCY DEPARTMENT Provider Note   CSN: [id number] Arrival date & time: 02/17/21  2027     History No chief complaint on file.   Morgan Long is a 50 y.o. female presents to the ED for lightheadedness, nausea, photophobia, and phonophobia since yesterday with headache presenting today.  Patient has a history of migraines and reports this feels mildly worse than her typical migraine.  Migraines are currently treated with Botox injections by her neurologist.  The patient mentioned that she tried taking her [name] without relief of symptoms.  Additionally, the patient mentions she measured her blood pressure today with an elevated reading of 170/65.  She denies any new chest pain or shortness of breath.  She denies any vision loss, tinnitus, syncope.  Denies any back pain, abdominal pain, or vomiting.  Medical history includes migraine and asthma.  Denies any daily medication.  Allergic to Tylox and Tylenol 3.  Patient denies any tobacco use, EtOH, or drug use.  HPI     Past Medical History:  Diagnosis Date   Allergic rhinitis    Asthma    B12 deficiency    Chronic migraine    Dr. Catalina Gravel   Constipation    Dry eye syndrome of both lacrimal glands    Dry skin    Endometriosis    Fatigue    Gastroparesis    GERD (gastroesophageal reflux disease)    Headache(784.0)    occasional, dx w/ Migraines before, Topamax helps   Heartburn    Hepatic steatosis    History of stomach ulcers    Lactose intolerance    Localized edema    Migraine    Muscle weakness (generalized)    Nuclear cataract of both eyes    Mild   Overweight    Pain in right ankle and joints of right foot    Prediabetes    Shortness of breath    Sinus complaint    Sinusitis    Tired    TMJ pain dysfunction syndrome    occasional   Vitamin D deficiency     Patient Active Problem List   Diagnosis Date Noted   Frequent PVCs 12/30/2020   DOE (dyspnea on exertion) 12/30/2020   Allergic  contact dermatitis due to cosmetics 12/10/2020   Newly recognized murmur 12/10/2020   Herpes zoster 11/12/2020   Preventative health care 11/04/2020   Shortness of breath    COVID 09/13/2020   Adhesive capsulitis of left shoulder 08/04/2020   Pain in joint of left shoulder 08/03/2020   Hepatic adenoma 06/08/2020   Liver lesion 05/04/2020   TMJ pain dysfunction syndrome    Tired    Sinus complaint    SOB (shortness of breath)    Pain in right ankle and joints of right foot    Overweight    Muscle weakness (generalized)    Migraine    Localized edema    Lactose intolerance    History of stomach ulcers    Hepatic steatosis    Heartburn    Fatigue    Dry skin    Dry eye syndrome of both lacrimal glands    Constipation    Chronic migraine    Asthma    History of COVID-19 01/05/2020   Anxiety 05/12/2019   Abnormality of lung on CXR 03/06/2019   Moderate persistent asthma 01/23/2019   Lower extremity edema 01/23/2019   Capsulitis of ankle, left 09/24/2018   Synovitis of left ankle 09/24/2018   Ankle  injury, initial encounter 05/23/2018   Acute pain of left shoulder 03/12/2018   S/P repair of ligament of ankle 03/05/2018   Chronic pain of right ankle 10/28/2017   Other fatigue 08/14/2017   Dyspnea 08/14/2017   Other insomnia 08/14/2017   Medication management 06/12/2017   Nonintractable headache 06/12/2017   Gastroparesis 06/12/2017   Severe obesity (BMI >= 40) ([place]) 04/25/2017   Leukocytosis 03/01/2017   Obesity (BMI 35.0-39.9 without comorbidity) 03/01/2017   Chronic migraine without aura without status migrainosus, not intractable 03/01/2017   Sinusitis 07/31/2016   Influenza-like illness 07/12/2016   Peroneal tendinitis of left lower extremity 11/03/2015   Pes anserine bursitis 11/03/2015   Asthma with acute exacerbation 06/15/2015   Acute sinusitis 06/15/2015   GERD (gastroesophageal reflux disease) 01/13/2015   Myalgia and myositis 07/22/2014   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   Paresthesias 06/02/2014   Sore throat 05/20/2014   Degenerative cervical disc 10/28/2013   Bursitis, shoulder 09/17/2013   Muscle spasm of back 09/11/2013   Nonallopathic lesion of thoracic region 09/11/2013   General medical examination 03/28/2011   Chronic rhinitis    TMJ PAIN 12/16/2007   Headache(784.0) 12/16/2007    Past Surgical History:  Procedure Laterality Date   ANKLE SURGERY Left 11/16/2017   AUGMENTATION MAMMAPLASTY Bilateral 2006   BREAST ENHANCEMENT SURGERY  2006   BUNIONECTOMY     COLONOSCOPY     endrometroisis     fallopian tube removed     Left   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   UPPER GASTROINTESTINAL ENDOSCOPY     [facility] teeth extraction       OB History     Gravida  0   Para  0   Term  0   Preterm  0   AB  0   Living  0      SAB  0   IAB  0   Ectopic  0   Multiple  0   Live Births  0           Family History  Problem Relation Age of Onset   Throat cancer Mother    Cancer - Other Mother 35       throat - died 69 months   Cancer Mother    Alcoholism Mother    Stroke Father    Heart disease Father    Cancer Father    Colon cancer Paternal Grandfather    Breast cancer Maternal Aunt        breast   Breast cancer Maternal Aunt        breast   Pancreatic cancer Maternal Aunt    Hypertension Maternal Aunt        several family members   Breast cancer Maternal Aunt        total of 5 aunts   Breast cancer Maternal Aunt    Colon cancer Maternal Uncle 53       died 64   Throat cancer Maternal Uncle    Diabetes Other        grandmother   Asthma Other        cousin, maternal   Heart attack Neg Hx    Rectal cancer Neg Hx    Stomach cancer Neg Hx     Social History   Tobacco Use   Smoking status: Never   Smokeless tobacco: Never  Vaping Use   Vaping Use: Never used  Substance Use  Topics   Alcohol use: Not Currently    Comment: socially - occasional     Drug use: No    Home Medications Prior to Admission medications   Medication Sig Start Date End Date Taking? Authorizing Provider  albuterol (PROAIR HFA) 108 (90 Base) MCG/ACT inhaler Inhale 2 puffs into the lungs every 6 (six) hours as needed for wheezing or shortness of breath. 01/23/19   Carollee Herter, Alferd Apa, DO  cetirizine (ZYRTEC) 10 MG tablet TAKE 1 TABLET BY MOUTH DAILY 05/10/18   Roma Schanz R, DO  cyanocobalamin (,VITAMIN B-12,) 1000 MCG/ML injection Inject 1,000 mcg into the muscle every 30 (thirty) days. 12/29/20   [provider]  ESTROGEL 0.75 MG/1.25 GM (0.06%) topical gel Apply 1 application topically daily. 05/18/15   [provider]  etonogestrel-ethinyl estradiol (NUVARING) 0.12-0.015 MG/24HR vaginal ring Place 1 each vaginally every 28 (twenty-eight) days. Insert vaginally and leave in place for 3 consecutive weeks, then remove for 1 week.    [provider]  Insulin Pen Needle (BD PEN NEEDLE NANO U/F) 32G X 4 MM MISC Use Nano needle with Ozempic 02/02/21   Dennard Nip D, MD  linaclotide [facility]) 145 MCG CAPS capsule Take 1 capsule by mouth daily as needed for constipation.    [provider]  linaclotide Rolan Lipa) 72 MCG capsule Take 1 capsule (72 mcg total) by mouth daily before breakfast. 04/26/20   Carollee Herter, Alferd Apa, DO  Liraglutide -Weight Management (SAXENDA) 18 MG/3ML SOPN Inject 3 mg into the skin daily. Start at 1.8 daily. 02/16/21 03/18/21  Dennard Nip D, MD  Multiple Vitamin (MULTIVITAMIN) LIQD Take 5 mLs by mouth daily.    [provider]  PREVIDENT 5000 SENSITIVE 1.1-5 % PSTE USE BEFORE BED DO NOT RINSE 11/21/18   [provider]  promethazine-dextromethorphan (PROMETHAZINE-DM) 6.25-15 MG/5ML syrup Take 5 mLs by mouth 4 (four) times daily as needed. 11/02/20   [name], DO  Syringe/Needle, Disp, (SYRINGE 3CC/22GX1") 22G X 1" 3 ML MISC Use as directed to administer vitamin b12  injections monthly. 12/07/20   [name], DO  tretinoin (RETIN-A) 0.1 % cream 1 application every evening. 06/04/18   [provider]  Ubrogepant ([name]) 50 MG TABS Take 50 mg by mouth 2 (two) times daily as needed (1 pill at onset of migraine, may repeat in 2 hours, no more than 2 pills in 24 hours.). 03/31/20   Star Age, MD  Vitamin D, Ergocalciferol, (DRISDOL) 1.25 MG (50000 UNIT) CAPS capsule Take 1 capsule (50,000 Units total) by mouth every 7 (seven) days. 02/16/21   Dennard Nip D, MD    Allergies    Acetaminophen-codeine, Hydrocodone, Oxycodone, Tyloxapol, and Codeine  Review of Systems   Review of Systems  Constitutional:  Negative for chills and fever.  HENT:  Negative for congestion, ear pain, sore throat and tinnitus.   Eyes:  Positive for photophobia. Negative for pain and visual disturbance.  Respiratory:  Negative for cough and shortness of breath.   Cardiovascular:  Negative for chest pain and palpitations.  Gastrointestinal:  Positive for nausea. Negative for abdominal pain and vomiting.  Genitourinary:  Negative for dysuria and hematuria.  Musculoskeletal:  Negative for arthralgias and back pain.  Skin:  Negative for color change and rash.  Neurological:  Positive for light-headedness and headaches. Negative for seizures and syncope.  All other systems reviewed and are negative.  Physical Exam Updated Vital Signs BP 123/73 (BP Location: Right Arm)  Pulse 79   Temp 98.6 F (37 C) (Oral)   Resp 16   Ht '5\' 3"'$  (1.6 m)   Wt 94.3 kg   LMP 02/04/2021   SpO2 100%   BMI 36.83 kg/m   Physical Exam Vitals and nursing note reviewed.  Constitutional:      General: She is not in acute distress.    Appearance: Normal appearance. She is not toxic-appearing.  HENT:     Head: Normocephalic and atraumatic.     Mouth/Throat:     Mouth: Mucous membranes are moist.     Pharynx: No oropharyngeal exudate.  Eyes:     General: No scleral icterus.     Extraocular Movements: Extraocular movements intact.  Cardiovascular:     Rate and Rhythm: Normal rate and regular rhythm.  Pulmonary:     Effort: Pulmonary effort is normal. No respiratory distress.     Breath sounds: Normal breath sounds.  Abdominal:     General: Abdomen is flat. Bowel sounds are normal.     Palpations: Abdomen is soft.     Tenderness: There is no guarding or rebound.  Musculoskeletal:        General: No deformity.     Cervical back: Normal range of motion.  Skin:    General: Skin is warm and dry.  Neurological:     General: No focal deficit present.     Mental Status: She is alert. Mental status is at baseline.     Cranial Nerves: No cranial nerve deficit.     Sensory: No sensory deficit.     Motor: No weakness.     Comments: Cerebellum intact.  Cranial nerves grossly intact.  Patient does not have any unilateral weakness or sensory deficit.  No facial droop.  Speech is clear and appropriate.    ED Results / Procedures / Treatments   Labs (all labs ordered are listed, but only abnormal results are displayed) Labs Reviewed - No data to display  EKG None  Radiology No results found.  Procedures Procedures   Medications Ordered in ED Medications  lactated ringers bolus 1,000 mL (0 mLs Intravenous Stopped 02/18/21 0018)  ketorolac (TORADOL) 15 MG/ML injection 15 mg (15 mg Intravenous Given 02/17/21 2309)  metoCLOPramide (REGLAN) injection 10 mg (10 mg Intravenous Given 02/17/21 2311)  diphenhydrAMINE (BENADRYL) injection 25 mg (25 mg Intravenous Given 02/17/21 2309)    ED Course  I have reviewed the triage vital signs and the nursing notes.  Pertinent labs & imaging results that were available during my care of the patient were reviewed by me and considered in my medical decision making (see chart for details).  Morgan Long is a 50 y.o. female presents to the ED for lightheadedness, nausea, photophobia, and phonophobia since yesterday with headache  presenting today.  Differential includes migraine, subarachnoid hemorrhage, intracranial hemorrhage, stroke, GCA.  On exam patient was lying on stretcher in dimmed room.  Was experiencing photophobia and phonophobia.  Vital signs stable blood pressure 123/73 and satting 100% on room air.  Patient responds to questions with appropriate answers.  Speech is not slurred.  Scalp is not tender to touch.  She denies any trauma, MVC's, or falls.  Patient reports this feels mildly worse than her typical migraine.  Based on patient presentation, vital signs, reassuring physical exam, and history, it is less likely this patient is experiencing a subarachnoid or intracranial hemorrhage or stroke or GCA.   Discussed with pharmacist any possible medication interactions for migraine cocktail  with you Ubrevly.  She reported it was safety of patient current medications: Reglan, Benadryl, Toradol, and lactated Ringer's.  On reevaluation of patient, she is lying comfortably on her side in bed watching television.  Patient's speech is louder and overall looks more comfortable.  She reports that her nausea and pain have improved.  Discussed discharge plan of supportive care, brain rest, and hydration as well as following up with neurologist/PCP for further migraine management.  Discussed return precautions with patient.  Patient agrees with plan.  Patient stable and is being discharged home in good condition.  MDM Rules/Calculators/A&P                         Final Clinical Impression(s) / ED Diagnoses Final diagnoses:  Tension-type headache, not intractable, unspecified chronicity pattern    Rx / DC Orders ED Discharge Orders     None        Sherrell Puller, PA-C 02/18/21 0030    Tegeler, Gwenyth Allegra, MD 02/18/21 1506

## 2021-02-18 NOTE — Discharge Instructions (Addendum)
Recommend limiting light exposure including overhead lights, cell phone, screens to rest your eyes and brain.  You can rotate Tylenol and ibuprofen for pain management.  Make sure you are staying well-hydrated.  Follow-up with PCP for migraine management.  Return to the ED for any new or worsening symptoms.

## 2021-02-20 ENCOUNTER — Encounter: Payer: Self-pay | Admitting: Family Medicine

## 2021-02-21 ENCOUNTER — Encounter: Payer: Self-pay | Admitting: Neurology

## 2021-02-21 ENCOUNTER — Encounter: Payer: Self-pay | Admitting: Family Medicine

## 2021-02-21 ENCOUNTER — Other Ambulatory Visit: Payer: Self-pay

## 2021-02-21 ENCOUNTER — Ambulatory Visit (INDEPENDENT_AMBULATORY_CARE_PROVIDER_SITE_OTHER): Payer: BC Managed Care – PPO | Admitting: Family Medicine

## 2021-02-21 VITALS — BP 108/68 | HR 82 | Temp 99.1°F | Resp 18 | Ht 63.0 in | Wt 212.8 lb

## 2021-02-21 DIAGNOSIS — Z23 Encounter for immunization: Secondary | ICD-10-CM | POA: Diagnosis not present

## 2021-02-21 DIAGNOSIS — G43809 Other migraine, not intractable, without status migrainosus: Secondary | ICD-10-CM | POA: Diagnosis not present

## 2021-02-21 LAB — CBC WITH DIFFERENTIAL/PLATELET
Basophils Absolute: 0.1 10*3/uL (ref 0.0–0.1)
Basophils Relative: 1.3 % (ref 0.0–3.0)
Eosinophils Absolute: 0.1 10*3/uL (ref 0.0–0.7)
Eosinophils Relative: 1.1 % (ref 0.0–5.0)
HCT: 37.6 % (ref 36.0–46.0)
Hemoglobin: 12.2 g/dL (ref 12.0–15.0)
Lymphocytes Relative: 21.5 % (ref 12.0–46.0)
Lymphs Abs: 1.5 10*3/uL (ref 0.7–4.0)
MCHC: 32.5 g/dL (ref 30.0–36.0)
MCV: 91 fl (ref 78.0–100.0)
Monocytes Absolute: 0.7 10*3/uL (ref 0.1–1.0)
Monocytes Relative: 9.5 % (ref 3.0–12.0)
Neutro Abs: 4.7 10*3/uL (ref 1.4–7.7)
Neutrophils Relative %: 66.6 % (ref 43.0–77.0)
Platelets: 355 10*3/uL (ref 150.0–400.0)
RBC: 4.13 Mil/uL (ref 3.87–5.11)
RDW: 14 % (ref 11.5–15.5)
WBC: 7.1 10*3/uL (ref 4.0–10.5)

## 2021-02-21 LAB — COMPREHENSIVE METABOLIC PANEL
ALT: 16 U/L (ref 0–35)
AST: 14 U/L (ref 0–37)
Albumin: 3.7 g/dL (ref 3.5–5.2)
Alkaline Phosphatase: 65 U/L (ref 39–117)
BUN: 22 mg/dL (ref 6–23)
CO2: 22 mEq/L (ref 19–32)
Calcium: 8.6 mg/dL (ref 8.4–10.5)
Chloride: 108 mEq/L (ref 96–112)
Creatinine, Ser: 0.85 mg/dL (ref 0.40–1.20)
GFR: 78.83 mL/min (ref >60.00)
Glucose, Bld: 90 mg/dL (ref 70–99)
Potassium: 4.4 mEq/L (ref 3.5–5.1)
Sodium: 138 mEq/L (ref 135–145)
Total Bilirubin: 0.4 mg/dL (ref 0.2–1.2)
Total Protein: 6.8 g/dL (ref 6.0–8.3)

## 2021-02-21 LAB — SEDIMENTATION RATE: Sed Rate: 50 mm/hr — ABNORMAL HIGH (ref 0–30)

## 2021-02-21 MED ORDER — METHOCARBAMOL 500 MG PO TABS: 500.0000 mg | ORAL_TABLET | Freq: Four times a day (QID) | ORAL | 1 refills | Status: DC

## 2021-02-21 MED ORDER — KETOROLAC TROMETHAMINE 60 MG/2ML IM SOLN
60.0000 mg | Freq: Once | INTRAMUSCULAR | Status: AC
  Administered 2021-02-21: 60 mg via INTRAMUSCULAR

## 2021-02-21 NOTE — Telephone Encounter (Signed)
Patient returned call.  She related since last Thursday evening thru today she has been having migraine headaches. Some new sx  lightheadedness, feeling like she was going to pass out blurry vision, nausea.  She did go to the ED treated for dehydration/ heat, migraine level 10.   Was treated then continued with migraines, went to Dr. Etter Sjogren today, labs, ordered MRI w/o contrast and toradol.  Migraine level 4 right now.  [name] not help, rizatriptan help some but then headache come back.  She has not had her vision checked.  She wanted [name] to know and if any other recommendations, see her too?  This is new presentation for her an scared her.  She had been doing so well with her botox.  I relayed will forward message to Dr. Rexene Alberts and will call her back (could be tomorrow as I am out at 1545 today).  Pt verbalized understanding.

## 2021-02-21 NOTE — Progress Notes (Signed)
Established Patient Office Visit  Subjective:  Patient ID: Morgan Long, female    DOB: 10/25/1970  Age: 50 y.o. MRN: [id number]  CC:  Chief Complaint  Patient presents with   Dizziness    X3 days, pt states havinng nausea and a migraine. Pt states taking OTC meds to help with the migraine and taking [name] and states these didn't help.    HPI Morgan Long presents for c/o migraine x 3 days.  + nausea   ubrelvy is not helping --- she has not called neuro.   She was in the er 9/15 and given an migraine cocktail.  The headache did go away but she feels like it is coming back.    Past Medical History:  Diagnosis Date   Allergic rhinitis    Asthma    B12 deficiency    Chronic migraine    Dr. Catalina Gravel   Constipation    Dry eye syndrome of both lacrimal glands    Dry skin    Endometriosis    Fatigue    Gastroparesis    GERD (gastroesophageal reflux disease)    Headache(784.0)    occasional, dx w/ Migraines before, Topamax helps   Heartburn    Hepatic steatosis    History of stomach ulcers    Lactose intolerance    Localized edema    Migraine    Muscle weakness (generalized)    Nuclear cataract of both eyes    Mild   Overweight    Pain in right ankle and joints of right foot    Prediabetes    Shortness of breath    Sinus complaint    Sinusitis    Tired    TMJ pain dysfunction syndrome    occasional   Vitamin D deficiency     Past Surgical History:  Procedure Laterality Date   ANKLE SURGERY Left 11/16/2017   AUGMENTATION MAMMAPLASTY Bilateral 2006   BREAST ENHANCEMENT SURGERY  2006   BUNIONECTOMY     COLONOSCOPY     endrometroisis     fallopian tube removed     Left   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   UPPER GASTROINTESTINAL ENDOSCOPY     [facility] teeth extraction      Family History  Problem Relation Age of Onset   Throat cancer Mother    Cancer - Other Mother 29       throat - died 61 months   Cancer  Mother    Alcoholism Mother    Stroke Father    Heart disease Father    Cancer Father    Colon cancer Paternal Grandfather    Breast cancer Maternal Aunt        breast   Breast cancer Maternal Aunt        breast   Pancreatic cancer Maternal Aunt    Hypertension Maternal Aunt        several family members   Breast cancer Maternal Aunt        total of 5 aunts   Breast cancer Maternal Aunt    Colon cancer Maternal Uncle 57       died 33   Throat cancer Maternal Uncle    Diabetes Other        grandmother   Asthma Other        cousin, maternal   Heart attack Neg Hx    Rectal cancer Neg Hx    Stomach cancer  Neg Hx     Social History   Socioeconomic History   Marital status: Single    Spouse name: Not on file   Number of children: 0   Years of education: BS   Highest education level: Not on file  Occupational History   Occupation: Paramedic, going to school    Employer: [employer]   Occupation: Air traffic controller  Tobacco Use   Smoking status: Never   Smokeless tobacco: Never  Vaping Use   Vaping Use: Never used  Substance and Sexual Activity   Alcohol use: Not Currently    Comment: socially - occasional    Drug use: No   Sexual activity: Yes    Birth control/protection: Inserts    Comment: nuvaring  Other Topics Concern   Not on file  Social History Narrative   Household:sister and her 3 kids    Drinks occasional starbucks drink       Social Determinants of Health   Financial Resource Strain: Not on file  Food Insecurity: Not on file  Transportation Needs: Not on file  Physical Activity: Not on file  Stress: Not on file  Social Connections: Not on file  Intimate Partner Violence: Not on file    Outpatient Medications Prior to Visit  Medication Sig Dispense Refill   albuterol (PROAIR HFA) 108 (90 Base) MCG/ACT inhaler Inhale 2 puffs into the lungs every 6 (six) hours as needed for wheezing or shortness of breath. 18 g 2   cetirizine  (ZYRTEC) 10 MG tablet TAKE 1 TABLET BY MOUTH DAILY 30 tablet 5   cyanocobalamin (,VITAMIN B-12,) 1000 MCG/ML injection Inject 1,000 mcg into the muscle every 30 (thirty) days.     ESTROGEL 0.75 MG/1.25 GM (0.06%) topical gel Apply 1 application topically daily.  0   etonogestrel-ethinyl estradiol (NUVARING) 0.12-0.015 MG/24HR vaginal ring Place 1 each vaginally every 28 (twenty-eight) days. Insert vaginally and leave in place for 3 consecutive weeks, then remove for 1 week.     Insulin Pen Needle (BD PEN NEEDLE NANO U/F) 32G X 4 MM MISC Use Nano needle with Ozempic 100 each 0   linaclotide (LINZESS) 145 MCG CAPS capsule Take 1 capsule by mouth daily as needed for constipation.     linaclotide (LINZESS) 72 MCG capsule Take 1 capsule (72 mcg total) by mouth daily before breakfast. 30 capsule 6   Liraglutide -Weight Management (SAXENDA) 18 MG/3ML SOPN Inject 3 mg into the skin daily. Start at 1.8 daily. 15 mL 0   Multiple Vitamin (MULTIVITAMIN) LIQD Take 5 mLs by mouth daily.     PREVIDENT 5000 SENSITIVE 1.1-5 % PSTE USE BEFORE BED DO NOT RINSE     promethazine-dextromethorphan (PROMETHAZINE-DM) 6.25-15 MG/5ML syrup Take 5 mLs by mouth 4 (four) times daily as needed. 118 mL 0   Syringe/Needle, Disp, (SYRINGE 3CC/22GX1") 22G X 1" 3 ML MISC Use as directed to administer vitamin b12 injections monthly. 100 each 0   tretinoin (RETIN-A) 0.1 % cream 1 application every evening.     Ubrogepant ([name]) 50 MG TABS Take 50 mg by mouth 2 (two) times daily as needed (1 pill at onset of migraine, may repeat in 2 hours, no more than 2 pills in 24 hours.). 10 tablet 3   Vitamin D, Ergocalciferol, (DRISDOL) 1.25 MG (50000 UNIT) CAPS capsule Take 1 capsule (50,000 Units total) by mouth every 7 (seven) days. 4 capsule 0   No facility-administered medications prior to visit.    Allergies  Allergen  Reactions   Acetaminophen-Codeine Itching   Hydrocodone Nausea Only   Oxycodone Itching   Tyloxapol     Other  reaction(s): Unknown   Codeine Itching and Nausea Only    ROS Review of Systems  Constitutional:  Negative for appetite change, diaphoresis, fatigue and unexpected weight change.  Eyes:  Negative for pain, redness and visual disturbance.  Respiratory:  Negative for cough, chest tightness, shortness of breath and wheezing.   Cardiovascular:  Negative for chest pain, palpitations and leg swelling.  Gastrointestinal:  Positive for nausea.  Endocrine: Negative for cold intolerance, heat intolerance, polydipsia, polyphagia and polyuria.  Genitourinary:  Negative for difficulty urinating, dysuria and frequency.  Neurological:  Positive for dizziness and headaches. Negative for light-headedness and numbness.     Objective:    Physical Exam Vitals and nursing note reviewed.  Constitutional:      Appearance: She is well-developed.  HENT:     Head: Normocephalic and atraumatic.  Eyes:     Conjunctiva/sclera: Conjunctivae normal.  Neck:     Thyroid: No thyromegaly.     Vascular: No carotid bruit or JVD.  Cardiovascular:     Rate and Rhythm: Normal rate and regular rhythm.     Heart sounds: Normal heart sounds. No murmur heard. Pulmonary:     Effort: Pulmonary effort is normal. No respiratory distress.     Breath sounds: Normal breath sounds. No wheezing or rales.  Chest:     Chest wall: No tenderness.  Musculoskeletal:     Cervical back: Normal range of motion and neck supple.  Neurological:     Mental Status: She is alert and oriented to person, place, and time.    BP 108/68 (BP Location: Right Arm, Patient Position: Sitting, Cuff Size: Large)   Pulse 82   Temp 99.1 F (37.3 C) (Oral)   Resp 18   Ht 5' 3"  (1.6 m)   Wt 212 lb 12.8 oz (96.5 kg)   LMP 02/04/2021   SpO2 99%   BMI 37.70 kg/m  Wt Readings from Last 3 Encounters:  02/21/21 212 lb 12.8 oz (96.5 kg)  02/17/21 207 lb 14.3 oz (94.3 kg)  02/16/21 208 lb (94.3 kg)     Health Maintenance Due  Topic Date Due    PAP SMEAR-Modifier  05/07/2020   COVID-19 Vaccine (4 - Booster for Pfizer series) 06/04/2020    There are no preventive care reminders to display for this patient.  Lab Results  Component Value Date   TSH 1.590 02/02/2021   Lab Results  Component Value Date   WBC 9.3 02/02/2021   HGB 12.9 02/02/2021   HCT 39.6 02/02/2021   MCV 91 02/02/2021   PLT 292 11/10/2020   Lab Results  Component Value Date   NA 136 02/02/2021   K 4.3 02/02/2021   CO2 20 02/02/2021   GLUCOSE 74 02/02/2021   BUN 15 02/02/2021   CREATININE 0.83 02/02/2021   BILITOT 0.3 02/02/2021   ALKPHOS 86 02/02/2021   AST 19 02/02/2021   ALT 16 02/02/2021   PROT 6.8 02/02/2021   ALBUMIN 4.0 02/02/2021   CALCIUM 8.5 (L) 02/02/2021   ANIONGAP 7 11/10/2020   EGFR 85 02/02/2021   GFR 88.95 11/02/2020   Lab Results  Component Value Date   CHOL 201 (H) 02/02/2021   Lab Results  Component Value Date   HDL 71 02/02/2021   Lab Results  Component Value Date   LDLCALC 114 (H) 02/02/2021   Lab Results  Component  Value Date   TRIG 91 02/02/2021   Lab Results  Component Value Date   CHOLHDL 3 05/14/2014   Lab Results  Component Value Date   HGBA1C 5.9 (H) 02/02/2021      Assessment & Plan:   Problem List Items Addressed This Visit       Unprioritized   Migraine - Primary    Worsening----  Check MRI Check labs and f/u neuro       Relevant Medications   methocarbamol (ROBAXIN) 500 MG tablet   Other Relevant Orders   CBC with Differential/Platelet   Comprehensive metabolic panel   Sedimentation rate   MR Brain Wo Contrast   Other Visit Diagnoses     Need for influenza vaccination       Relevant Orders   Flu Vaccine QUAD 30moIM (Fluarix, Fluzone & Alfiuria Quad PF) (Completed)       Meds ordered this encounter  Medications   methocarbamol (ROBAXIN) 500 MG tablet    Sig: Take 1 tablet (500 mg total) by mouth 4 (four) times daily.    Dispense:  45 tablet    Refill:  1   ketorolac  (TORADOL) injection 60 mg    Follow-up: Return if symptoms worsen or fail to improve.    YAnn Held DO

## 2021-02-21 NOTE — Telephone Encounter (Signed)
Pt has appointment this morning

## 2021-02-21 NOTE — Telephone Encounter (Signed)
I reviewed the chart, I really do not have any additional recommendations. I agree with work-up through primary care from today, and pursuing a brain MRI as planned. We can also look into the possibility of underlying obstructive sleep apnea as a cause for headache escalation.  If she has not had a sleep study within the past few years, I can order a home sleep test to evaluate.  Please let me know if she is agreeable, then order home sleep test.

## 2021-02-21 NOTE — Assessment & Plan Note (Signed)
Worsening----  Check MRI Check labs and f/u neuro

## 2021-02-21 NOTE — Telephone Encounter (Signed)
I called patient left message for her to return call regarding her message in my chart.  I see that she did see her primary primary care doctor today, hope she is feeling better but to give [name] a call back when she felt like it.

## 2021-02-21 NOTE — Patient Instructions (Signed)
Migraine Headache A migraine headache is an intense, throbbing pain on one side or both sides of the head. Migraine headaches may also cause other symptoms, such as nausea, vomiting, and sensitivity to light and noise. A migraine headache can last from 4 hours to 3 days. Talk with your doctor about what things may bring on (trigger) your migraine headaches. What are the causes? The exact cause of this condition is not known. However, a migraine may be caused when nerves in the brain become irritated and release chemicals that cause inflammation of blood vessels. This inflammation causes pain. This condition may be triggered or caused by: Drinking alcohol. Smoking. Taking medicines, such as: Medicine used to treat chest pain (nitroglycerin). Birth control pills. Estrogen. Certain blood pressure medicines. Eating or drinking products that contain nitrates, glutamate, aspartame, or tyramine. Aged cheeses, chocolate, or caffeine may also be triggers. Doing physical activity. Other things that may trigger a migraine headache include: Menstruation. Pregnancy. Hunger. Stress. Lack of sleep or too much sleep. Weather changes. Fatigue. What increases the risk? The following factors may make you more likely to experience migraine headaches: Being a certain age. This condition is more common in people who are 25-55 years old. Being female. Having a family history of migraine headaches. Being Caucasian. Having a mental health condition, such as depression or anxiety. Being obese. What are the signs or symptoms? The main symptom of this condition is pulsating or throbbing pain. This pain may: Happen in any area of the head, such as on one side or both sides. Interfere with daily activities. Get worse with physical activity. Get worse with exposure to bright lights or loud noises. Other symptoms may include: Nausea. Vomiting. Dizziness. General sensitivity to bright lights, loud noises, or  smells. Before you get a migraine headache, you may get warning signs (an aura). An aura may include: Seeing flashing lights or having blind spots. Seeing bright spots, halos, or zigzag lines. Having tunnel vision or blurred vision. Having numbness or a tingling feeling. Having trouble talking. Having muscle weakness. Some people have symptoms after a migraine headache (postdromal phase), such as: Feeling tired. Difficulty concentrating. How is this diagnosed? A migraine headache can be diagnosed based on: Your symptoms. A physical exam. Tests, such as: CT scan or an MRI of the head. These imaging tests can help rule out other causes of headaches. Taking fluid from the spine (lumbar puncture) and analyzing it (cerebrospinal fluid analysis, or CSF analysis). How is this treated? This condition may be treated with medicines that: Relieve pain. Relieve nausea. Prevent migraine headaches. Treatment for this condition may also include: Acupuncture. Lifestyle changes like avoiding foods that trigger migraine headaches. Biofeedback. Cognitive behavioral therapy. Follow these instructions at home: Medicines Take over-the-counter and prescription medicines only as told by your health care provider. Ask your health care provider if the medicine prescribed to you: Requires you to avoid driving or using heavy machinery. Can cause constipation. You may need to take these actions to prevent or treat constipation: Drink enough fluid to keep your urine pale yellow. Take over-the-counter or prescription medicines. Eat foods that are high in fiber, such as beans, whole grains, and fresh fruits and vegetables. Limit foods that are high in fat and processed sugars, such as fried or sweet foods. Lifestyle Do not drink alcohol. Do not use any products that contain nicotine or tobacco, such as cigarettes, e-cigarettes, and chewing tobacco. If you need help quitting, ask your health care  provider. Get at least 8   hours of sleep every night. Find ways to manage stress, such as meditation, deep breathing, or yoga. General instructions   Keep a journal to find out what may trigger your migraine headaches. For example, write down: What you eat and drink. How much sleep you get. Any change to your diet or medicines. If you have a migraine headache: Avoid things that make your symptoms worse, such as bright lights. It may help to lie down in a dark, quiet room. Do not drive or use heavy machinery. Ask your health care provider what activities are safe for you while you are experiencing symptoms. Keep all follow-up visits as told by your health care provider. This is important. Contact a health care provider if: You develop symptoms that are different or more severe than your usual migraine headache symptoms. You have more than 15 headache days in one month. Get help right away if: Your migraine headache becomes severe. Your migraine headache lasts longer than 72 hours. You have a fever. You have a stiff neck. You have vision loss. Your muscles feel weak or like you cannot control them. You start to lose your balance often. You have trouble walking. You faint. You have a seizure. Summary A migraine headache is an intense, throbbing pain on one side or both sides of the head. Migraines may also cause other symptoms, such as nausea, vomiting, and sensitivity to light and noise. This condition may be treated with medicines and lifestyle changes. You may also need to avoid certain things that trigger a migraine headache. Keep a journal to find out what may trigger your migraine headaches. Contact your health care provider if you have more than 15 headache days in a month or you develop symptoms that are different or more severe than your usual migraine headache symptoms. This information is not intended to replace advice given to you by your health care provider. Make sure you  discuss any questions you have with your health care provider. Document Revised: 09/13/2018 Document Reviewed: 07/04/2018 Elsevier Patient Education  2022 [employer] Inc.  

## 2021-02-22 ENCOUNTER — Encounter (INDEPENDENT_AMBULATORY_CARE_PROVIDER_SITE_OTHER): Payer: Self-pay

## 2021-02-23 ENCOUNTER — Other Ambulatory Visit: Payer: Self-pay | Admitting: Family Medicine

## 2021-02-23 DIAGNOSIS — R600 Localized edema: Secondary | ICD-10-CM

## 2021-02-24 ENCOUNTER — Encounter: Payer: Self-pay | Admitting: Family Medicine

## 2021-02-28 DIAGNOSIS — L74519 Primary focal hyperhidrosis, unspecified: Secondary | ICD-10-CM | POA: Diagnosis not present

## 2021-03-01 ENCOUNTER — Other Ambulatory Visit: Payer: Self-pay

## 2021-03-01 ENCOUNTER — Ambulatory Visit (INDEPENDENT_AMBULATORY_CARE_PROVIDER_SITE_OTHER): Payer: BC Managed Care – PPO | Admitting: Family Medicine

## 2021-03-01 ENCOUNTER — Encounter (INDEPENDENT_AMBULATORY_CARE_PROVIDER_SITE_OTHER): Payer: Self-pay | Admitting: Family Medicine

## 2021-03-01 VITALS — BP 100/66 | HR 86 | Temp 99.1°F | Ht 63.0 in | Wt 205.0 lb

## 2021-03-01 DIAGNOSIS — Z6836 Body mass index (BMI) 36.0-36.9, adult: Secondary | ICD-10-CM

## 2021-03-01 DIAGNOSIS — E559 Vitamin D deficiency, unspecified: Secondary | ICD-10-CM

## 2021-03-01 NOTE — Telephone Encounter (Signed)
Please see message and advise.  Thank you. ° °

## 2021-03-01 NOTE — Progress Notes (Signed)
Chief Complaint:   OBESITY Morgan Long is here to discuss her progress with her obesity treatment plan along with follow-up of her obesity related diagnoses. Morgan Long is on the Category 2 Plan and states she is following her eating plan approximately 80-85% of the time. Morgan Long states she is walking for 60 minutes 3 times per week.  Today's visit was #: 3 Starting weight: 206 lbs Starting date: 02/02/2021 Today's weight: 205 lbs Today's date: 03/01/2021 Total lbs lost to date: 1 Total lbs lost since last in-office visit: 3  Interim History: Morgan Long continues to do well with weight loss. She is working on decreasing late night snacking. She was unable to start Saxenda due to some insurance questions.  Subjective:   1. Vitamin D deficiency Morgan Long is stable on Vit D, and she denies signs of over-replacement.  Assessment/Plan:   1. Vitamin D deficiency Low Vitamin D level contributes to fatigue and are associated with obesity, breast, and colon cancer. Morgan Long will continue prescription Vitamin D 50,000 IU every week and we will recheck labs in 1 month. She will follow-up for routine testing of Vitamin D, at least 2-3 times per year to avoid over-replacement.  2. Obesity with current BMI 36.4 Arli is currently in the action stage of change. As such, her goal is to continue with weight loss efforts. She has agreed to the Category 2 Plan.   We discussed various medication options to help Morgan Long with her weight loss efforts and we both agreed to start Saxenda.  Exercise goals: As is.  Behavioral modification strategies: increasing lean protein intake.  Morgan Long has agreed to follow-up with our clinic in 2 weeks. She was informed of the importance of frequent follow-up visits to maximize her success with intensive lifestyle modifications for her multiple health conditions.   Objective:   Blood pressure 100/66, pulse 86, temperature 99.1 F (37.3 C), height 5\' 3"  (1.6 m), weight 205 lb (93 kg), last  menstrual period 02/04/2021, SpO2 97 %. Body mass index is 36.31 kg/m.  General: Cooperative, alert, well developed, in no acute distress. HEENT: Conjunctivae and lids unremarkable. Cardiovascular: Regular rhythm.  Lungs: Normal work of breathing. Neurologic: No focal deficits.   Lab Results  Component Value Date   CREATININE 0.85 02/21/2021   BUN 22 02/21/2021   NA 138 02/21/2021   K 4.4 02/21/2021   CL 108 02/21/2021   CO2 22 02/21/2021   Lab Results  Component Value Date   ALT 16 02/21/2021   AST 14 02/21/2021   ALKPHOS 65 02/21/2021   BILITOT 0.4 02/21/2021   Lab Results  Component Value Date   HGBA1C 5.9 (H) 02/02/2021   HGBA1C 5.9 (H) 05/06/2018   HGBA1C 5.8 (H) 01/23/2018   HGBA1C 5.5 08/14/2017   HGBA1C 5.6 06/09/2015   Lab Results  Component Value Date   INSULIN 12.8 02/02/2021   INSULIN 11.2 05/06/2018   INSULIN 8.4 01/23/2018   INSULIN 8.0 08/14/2017   Lab Results  Component Value Date   TSH 1.590 02/02/2021   Lab Results  Component Value Date   CHOL 201 (H) 02/02/2021   HDL 71 02/02/2021   LDLCALC 114 (H) 02/02/2021   TRIG 91 02/02/2021   CHOLHDL 3 05/14/2014   Lab Results  Component Value Date   VD25OH 44.8 02/02/2021   VD25OH 35.92 11/02/2020   VD25OH 34.0 05/06/2018   Lab Results  Component Value Date   WBC 7.1 02/21/2021   HGB 12.2 02/21/2021   HCT 37.6 02/21/2021  MCV 91.0 02/21/2021   PLT 355.0 02/21/2021   Lab Results  Component Value Date   FERRITIN 169 05/21/2019   Attestation Statements:   Reviewed by clinician on day of visit: allergies, medications, problem list, medical history, surgical history, family history, social history, and previous encounter notes.  Time spent on visit including pre-visit chart review and post-visit care and charting was 30 minutes.    I, Trixie Dredge, am acting as transcriptionist for Dennard Nip, MD.  I have reviewed the above documentation for accuracy and completeness, and I  agree with the above. -  Dennard Nip, MD

## 2021-03-01 NOTE — Telephone Encounter (Signed)
Prior authorization was denied for Saxenda by her insurance and the patient was notified. Since medication was denied by [employer], she will have to discuss with provider. Prior authorization is requesting this medication be approved because it is not on their formulary list. Kirke Shaggy was denied due to it not being a covered benefit/excluded from her plan.

## 2021-03-01 NOTE — Telephone Encounter (Signed)
Hey, I sent you an email about this.  Let me know what I need to do.   Thank you.

## 2021-03-10 ENCOUNTER — Other Ambulatory Visit: Payer: Self-pay | Admitting: Family Medicine

## 2021-03-10 ENCOUNTER — Telehealth: Payer: Self-pay | Admitting: Family Medicine

## 2021-03-10 ENCOUNTER — Encounter: Payer: Self-pay | Admitting: Family Medicine

## 2021-03-10 DIAGNOSIS — F40240 Claustrophobia: Secondary | ICD-10-CM

## 2021-03-10 MED ORDER — ALPRAZOLAM 0.5 MG PO TABS: ORAL_TABLET | ORAL | 0 refills | Status: DC

## 2021-03-10 NOTE — Telephone Encounter (Signed)
Patient messaged [place] in [place] but wanted to follow up with [name] to make sure it was received. Patient has a MRI scheduled 10.08 & she would like to see if she can be prescribed anxiety meds. Please advise.   [id number] [place], [address] [place] [address]  Phone:  [phone]  Fax:  [phone]

## 2021-03-10 NOTE — Telephone Encounter (Signed)
Message was received and sent to PCP. Awaiting reply.

## 2021-03-12 ENCOUNTER — Other Ambulatory Visit: Payer: Self-pay

## 2021-03-12 ENCOUNTER — Ambulatory Visit
Admission: RE | Admit: 2021-03-12 | Discharge: 2021-03-12 | Disposition: A | Discharge status: Home / Self Care | Payer: BC Managed Care – PPO | Source: Ambulatory Visit | Attending: Family Medicine | Admitting: Family Medicine

## 2021-03-12 DIAGNOSIS — G43809 Other migraine, not intractable, without status migrainosus: Secondary | ICD-10-CM

## 2021-03-12 DIAGNOSIS — G43909 Migraine, unspecified, not intractable, without status migrainosus: Secondary | ICD-10-CM | POA: Diagnosis not present

## 2021-03-12 DIAGNOSIS — R9082 White matter disease, unspecified: Secondary | ICD-10-CM | POA: Diagnosis not present

## 2021-03-14 ENCOUNTER — Encounter: Payer: Self-pay | Admitting: Family Medicine

## 2021-03-14 ENCOUNTER — Encounter: Payer: Self-pay | Admitting: Neurology

## 2021-03-15 ENCOUNTER — Other Ambulatory Visit: Payer: Self-pay

## 2021-03-15 ENCOUNTER — Ambulatory Visit (INDEPENDENT_AMBULATORY_CARE_PROVIDER_SITE_OTHER): Payer: BC Managed Care – PPO | Admitting: Family Medicine

## 2021-03-15 ENCOUNTER — Encounter (INDEPENDENT_AMBULATORY_CARE_PROVIDER_SITE_OTHER): Payer: Self-pay | Admitting: Family Medicine

## 2021-03-15 VITALS — BP 121/77 | HR 83 | Temp 98.7°F | Ht 63.0 in | Wt 204.0 lb

## 2021-03-15 DIAGNOSIS — Z6836 Body mass index (BMI) 36.0-36.9, adult: Secondary | ICD-10-CM | POA: Diagnosis not present

## 2021-03-15 DIAGNOSIS — R7303 Prediabetes: Secondary | ICD-10-CM

## 2021-03-16 NOTE — Progress Notes (Signed)
Chief Complaint:   OBESITY Morgan Long is here to discuss her progress with her obesity treatment plan along with follow-up of her obesity related diagnoses. Morgan Long is on the Category 2 Plan and states she is following her eating plan approximately 85-90% of the time. Morgan Long states she is walking for 45 minutes 4 times per week.  Today's visit was #: 4 Starting weight: 206 lbs Starting date: 02/02/2021 Today's weight: 204 lbs Today's date: 03/15/2021 Total lbs lost to date: 2 Total lbs lost since last in-office visit: 1  Interim History: Morgan Long continues to work on weight loss. She is getting the run around from her insurance about Saxenda, and it is currently being denied. She is trying to follow her Category 2 plan.  Subjective:   1. Pre-diabetes Morgan Long is trying to get coverage for a GLP-1, but her insurance is denying it. She is working on diet and exercise in the meantime.  Assessment/Plan:   1. Pre-diabetes Morgan Long will continue working on increasing her protein and decreasing simple carbohydrates, as well as not skipping meals. She will continue to follow up as directed.  2. Obesity with current BMI 36.3 Morgan Long is currently in the action stage of change. As such, her goal is to continue with weight loss efforts. She has agreed to the Category 2 Plan and keeping a food journal and adhering to recommended goals of 400-550 calories and 40+ grams of protein at supper daily.   Exercise goals: Count steps when walking.  Behavioral modification strategies: increasing lean protein intake and no skipping meals.  Morgan Long has agreed to follow-up with our clinic in 2 weeks. She was informed of the importance of frequent follow-up visits to maximize her success with intensive lifestyle modifications for her multiple health conditions.   Objective:   Blood pressure 121/77, pulse 83, temperature 98.7 F (37.1 C), height 5\' 3"  (1.6 m), weight 204 lb (92.5 kg), SpO2 96 %. Body mass index is 36.14  kg/m.  General: Cooperative, alert, well developed, in no acute distress. HEENT: Conjunctivae and lids unremarkable. Cardiovascular: Regular rhythm.  Lungs: Normal work of breathing. Neurologic: No focal deficits.   Lab Results  Component Value Date   CREATININE 0.85 02/21/2021   BUN 22 02/21/2021   NA 138 02/21/2021   K 4.4 02/21/2021   CL 108 02/21/2021   CO2 22 02/21/2021   Lab Results  Component Value Date   ALT 16 02/21/2021   AST 14 02/21/2021   ALKPHOS 65 02/21/2021   BILITOT 0.4 02/21/2021   Lab Results  Component Value Date   HGBA1C 5.9 (H) 02/02/2021   HGBA1C 5.9 (H) 05/06/2018   HGBA1C 5.8 (H) 01/23/2018   HGBA1C 5.5 08/14/2017   HGBA1C 5.6 06/09/2015   Lab Results  Component Value Date   INSULIN 12.8 02/02/2021   INSULIN 11.2 05/06/2018   INSULIN 8.4 01/23/2018   INSULIN 8.0 08/14/2017   Lab Results  Component Value Date   TSH 1.590 02/02/2021   Lab Results  Component Value Date   CHOL 201 (H) 02/02/2021   HDL 71 02/02/2021   LDLCALC 114 (H) 02/02/2021   TRIG 91 02/02/2021   CHOLHDL 3 05/14/2014   Lab Results  Component Value Date   VD25OH 44.8 02/02/2021   VD25OH 35.92 11/02/2020   VD25OH 34.0 05/06/2018   Lab Results  Component Value Date   WBC 7.1 02/21/2021   HGB 12.2 02/21/2021   HCT 37.6 02/21/2021   MCV 91.0 02/21/2021   PLT 355.0  02/21/2021   Lab Results  Component Value Date   FERRITIN 169 05/21/2019   Attestation Statements:   Reviewed by clinician on day of visit: allergies, medications, problem list, medical history, surgical history, family history, social history, and previous encounter notes.  Time spent on visit including pre-visit chart review and post-visit care and charting was 30 minutes.    I, Trixie Dredge, am acting as transcriptionist for Dennard Nip, MD.  I have reviewed the above documentation for accuracy and completeness, and I agree with the above. -  Dennard Nip, MD

## 2021-03-18 ENCOUNTER — Other Ambulatory Visit: Payer: Self-pay | Admitting: Family Medicine

## 2021-03-18 DIAGNOSIS — L232 Allergic contact dermatitis due to cosmetics: Secondary | ICD-10-CM

## 2021-03-23 DIAGNOSIS — L710 Perioral dermatitis: Secondary | ICD-10-CM | POA: Diagnosis not present

## 2021-03-23 DIAGNOSIS — D2230 Melanocytic nevi of unspecified part of face: Secondary | ICD-10-CM | POA: Diagnosis not present

## 2021-03-23 DIAGNOSIS — L719 Rosacea, unspecified: Secondary | ICD-10-CM | POA: Diagnosis not present

## 2021-03-29 ENCOUNTER — Ambulatory Visit (INDEPENDENT_AMBULATORY_CARE_PROVIDER_SITE_OTHER): Payer: BC Managed Care – PPO | Admitting: Family Medicine

## 2021-03-30 ENCOUNTER — Ambulatory Visit: Payer: BC Managed Care – PPO

## 2021-04-13 ENCOUNTER — Ambulatory Visit: Payer: BC Managed Care – PPO | Admitting: Neurology

## 2021-04-13 ENCOUNTER — Ambulatory Visit (INDEPENDENT_AMBULATORY_CARE_PROVIDER_SITE_OTHER): Payer: BC Managed Care – PPO | Admitting: Family Medicine

## 2021-04-13 ENCOUNTER — Encounter (INDEPENDENT_AMBULATORY_CARE_PROVIDER_SITE_OTHER): Payer: Self-pay | Admitting: Family Medicine

## 2021-04-13 ENCOUNTER — Ambulatory Visit (INDEPENDENT_AMBULATORY_CARE_PROVIDER_SITE_OTHER): Payer: BC Managed Care – PPO | Admitting: Neurology

## 2021-04-13 ENCOUNTER — Other Ambulatory Visit: Payer: Self-pay

## 2021-04-13 VITALS — BP 131/83 | HR 78

## 2021-04-13 VITALS — BP 113/74 | HR 75 | Temp 98.6°F | Ht 63.0 in | Wt 205.0 lb

## 2021-04-13 DIAGNOSIS — Z9189 Other specified personal risk factors, not elsewhere classified: Secondary | ICD-10-CM | POA: Diagnosis not present

## 2021-04-13 DIAGNOSIS — R7303 Prediabetes: Secondary | ICD-10-CM

## 2021-04-13 DIAGNOSIS — Z6836 Body mass index (BMI) 36.0-36.9, adult: Secondary | ICD-10-CM

## 2021-04-13 DIAGNOSIS — G43719 Chronic migraine without aura, intractable, without status migrainosus: Secondary | ICD-10-CM

## 2021-04-13 MED ORDER — ONABOTULINUMTOXINA 100 UNITS IJ SOLR
200.0000 [IU] | Freq: Once | INTRAMUSCULAR | Status: AC
  Administered 2021-04-13: 200 [IU] via INTRAMUSCULAR

## 2021-04-13 MED ORDER — OZEMPIC (0.25 OR 0.5 MG/DOSE) 2 MG/1.5ML ~~LOC~~ SOPN: 0.5000 mg | PEN_INJECTOR | SUBCUTANEOUS | 0 refills | Status: DC

## 2021-04-13 NOTE — Patient Instructions (Signed)
Please remember, botulinum toxin takes about 3-7 days to kick in. As discussed, this is not a pain shot. The purpose of the injections is to gradually improve your symptoms. In some patients it takes up to 2-3 weeks to make a difference and it wears off with time. Sometimes it may wear off before it is time for the next injection. We still should wait till the next 3 monthly injection, because injecting too frequently may cause you to develop immunity to the botulinum toxin. We are looking for a reduction in the severity and/or frequency of your symptoms. As a reminder, side effects to look out for are (but not limited to): mouth dryness, dryness of the eyes, heaviness of your head or muscle weakness, including droopy face or droopy eyelid(s), rarely: speech or swallowing difficulties and very rarely: breathing difficulties. Some people have transient neck pain or soreness which typically responds to over-the-counter anti-inflammatory medication and local heat application with a heat pad. If you think you have a severe reaction to the botulinum toxin, such as weakness, trouble speaking, trouble breathing, or trouble swallowing, you have to call 911 or have someone take you to the nearest emergency room. However, most people have either no or minimal side effects from the injections. It is normal to have a little bit of redness and swelling around the injection sites which usually improves after a few hours. Rarely, there may be a bruise that improves on its own. Most side effects reported are very mild and resolve within 10-14 days. Please feel free to call us if you have any additional questions or concerns: [phone] or email us through My Chart. We may have to adjust the dose over time, depending on your results from this injection and your overall response over time to this medication.   

## 2021-04-13 NOTE — Progress Notes (Signed)
Morgan Long is a 50 year old right-handed woman with an underlying medical history of allergic rhinitis, endometriosis, TMJ problems, Reflux disease, gastroparesis, hepatic congestion with steatosis, s/p partial hepatectomy for hepatic tumor, lactose intolerance, neck pain, and obesity, who presents for her 14th Botox injection for her intractable migraines.  The patient is unaccompanied today. She had her 13th injection on 01/11/2021,, at which time she reported doing well.  She had experienced no side effects.  She called in the interim reporting breakthrough migraines.  Her primary care ordered a brain MRI.  She had a brain MRI without contrast on 03/12/2021 and I reviewed the results: IMPRESSION: No evidence of acute intracranial abnormality.   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   Otherwise unremarkable non-contrast MRI appearance of the brain.  Today, 04/13/2021: She reports having done well, as far as the injections. She continues to benefit from the injections.  She has noted no side effects.  She has an issue with a root canal and is supposed to see a specialist dentist today.  She reports that she had headaches from her jaw/root canal issues.    Written informed consent for recurrent, 3 monthly intramuscular injections with botulinum toxin for this indication has previously been obtained and scanned into the patient's electronic chart. I will re-consent if the type of botulinum toxin used or the indication for injection changes for this patient in the future. The patient is informed that we will use the same consent for her recurrent, most likely 3 monthly injections. She demonstrated understanding and voiced agreement.   I have previously talked to the patient in detail about expectations, limitations, benefits as well as potential adverse effects of botulinum toxin injections. The patient understands that the side  effects include (but are not limited to): Mouth dryness, dryness of eyes, speech and swallowing difficulties, respiratory depression or problems breathing, weakness of muscles including more distant muscles than the ones injected, flu-like symptoms, myalgias, injection site reactions such as redness, itching, swelling, pain, and infection.   200 units of botulinum toxin type A were reconstituted using preservative-free normal saline to a concentration of 10 units per 0.1 mL and drawn up into 1 mL tuberculin syringes. Lot number was [id number], expiration date 09/2023 for 1 200 unit vial, buy and bill.    O/E: BP 131/83 (BP Location: Left Arm, Patient Position: Sitting)   Pulse 78     The patient was situated in a chair, sitting comfortably. After preparing the areas with 70% isopropyl alcohol and using a 26 gauge 1 1/2 inch hollow lumen recording EMG needle for the neck injections as well as a 30 gauge 1 inch needle for the facial injections, a total dose of 155 units of botulinum toxin type A in the form of Botox was injected into the muscles and the following distribution and quantities:   #1: 10 units on the right and 10 units in the left frontalis muscles, broken down in 2 sites on each side. #2: 5 units in the right and 5 units in the left corrugator muscles. #3: 15 units in the right and 15 units in the left occipitalis muscles, broken down in 3 sites on each side. #4: 20 units in the right and 20 units in the left temporalis muscles, broken down in 4 sites on each side. #5: 15 units on the right and 15 units in the left upper trapezius muscles, broken down in 3 sites on each side. #6:  10 units in the right and 10 units in the left splenius capitis muscles, broken down in 2 sites on each side. #7: 2.5 units in the right and 2.5 units in the left procerus muscles.   EMG guidance was utilized for the neck injections with mild EMG activity noted, especially in the splenius capitis muscles  bilaterally, right little more noticeable than left.    A dose of 45 units out of a total dose of 200 units was discarded as unavoidable waste.   The patient tolerated the procedure well without immediate complications. She was advised to make a followup appointment for repeat injections in 3 months from now and encouraged to call us with any interim questions, concerns, problems, or updates. She was in agreement and did not have any questions prior to leaving clinic today.   Previously (copied from previous notes for reference):   She had her 12th injection on 10/11/2020, at which time she reported doing well, latest injection had worked well.   She had her 10th injection on 03/31/2020, at which time she reported, she had done well.   She had an interim injection with [name] on 07/12/2020.     She missed an injection in between and had her ninth injection with [name], nurse practitioner on 12/29/2019.   She had her 8th injection on 05/08/2019 at which time she reported that she had not taken the [name].  She was unsure if she had any refills.  She presented with a migraine.  She tolerated the Botox injection reasonably well.       I saw her on 01/30/2019 for her seventh injection, at which time she reported that the Botox injections were very effective and that the new medication [name] was more helpful for acute use than her triptan before.   I saw her on 10/24/2018 for her 6th injection, at which time she had noticed a recent flareup of her migraines.  She did not think that the [name] had helped that much.  She was a little overdue for her Botox injection at the time.   I saw her on 07/17/2018 for her fifth injection, at which time she reported Botox had been helpful, she reported overall very good results, no side effects, her migraines had reduced by at least 7 days a month, she also reported reduction by at least 100 hours/month of her migraine headaches.  She did report that she had  received cosmetic Botox injection in her eyebrow area.   I saw her on 04/10/2018 for her 4th injection, at which time she reported good results with her Botox injections in significant reduction in her headache frequency and severely overall. She denied any side effects. She was overdue by nearly 2 weeks for her injection at the time.   I saw her on 12/26/2017 for her 3rd injection, at which time she reported having had a recent increase in her headache frequency, she had recent foot surgery.   I saw her on 09/25/2017 for her second round of injections. She felt that in the week prior to the injection her headaches were more intense.   I saw her on 06/28/2017 for her initial Botox injection, at which time she received 155 units of Botox.   She was seen by her primary care physician and received a Toradol injection on 06/19/2017 as well as a prescription for Relpax.   04/25/17: Dr. Melton Alar retired. She reports a long-standing history of migraine headaches, she recalls she was about 50  years old when she started having migraines. For quite some time she had spontaneous improvement in her migraines but some 4 or 5 years ago started having more frequent migraines. She has tried and failed multiple preventative medications but does not have a list with her and prior records from Dr. Audery Amel office are not available today. She reports that his office is closed at this point. She did not get her records from his office. Perhaps her primary care provider has records. I encouraged her to try to obtain those records as it will help [name] manage her migraines optimally. She recalls having tried Topamax. She had hair loss from some medications but is not completely sure which ones. Topamax did not help, Zonegran caused her sedation, nortriptyline was also tried but she is unsure what happened with it. Amitriptyline she may have tried as well. She did not try Depakote. She is worried about sedation side effects as she  also is going to school and has to be alert throughout the day. She takes occasional nausea medicine which likely is Zofran under the tongue. She has tried Maxalt under the tongue which is helpful at times. She needs a refill on that. She has regular eye exams because of her history of dry eyes. She used to wear a single contact but was advised to stop using it. She has had 2 rounds of Botox injections, last time about 3 months ago as she recalls. She remembers doing okay with it. Prior to starting Botox injections she reports having about 18-20 headache days per month typically. She does not always have a typical aura but sometimes has visual blurriness and bringing in her ears to warn her about an upcoming migraine headache. She has occasional nausea that precedes the headache.   I saw her on 06/09/2015 at which time she was referred is a new patient referral for a new problem, referred by her optometrist at the time for 1 month history of blurry vision. Her exam at the time was nonfocal and reassuringly she had no significant eye related findings. I suggested further workup in the form of visual evoked potentials, blood work, and she also reported a 6 month history of feeling tired. She had no one-sided weakness, tingling or numbness. Symptoms from the past which included paresthesias had resolved completely. She was under the care of Dr. Melton Alar for migraines. She was in the process of titrating Zonegran. She was on 225 mg each night. When she took 300 mg each night she felt too sleepy during the day. She denied any symptoms of sleep disordered breathing. She did admit not being a good sleeper. She did endorse stress as she was in school online for psychology and was also working off-and-on in Personal assistant. She reported not drinking sodas, not drinking alcohol and she reported not smoking. She did report pain with eye movements and dry eyes. She reported no family history of multiple sclerosis or lupus. She  denied joint pain with the exception of bilateral knee pain and she also reported a 50 pound weight gain in the last year. She had visual evoked potentials on 07/06/2015: Impression: The visual evoked response test above was within normal limits bilaterally. No evidence of conduction slowing was seen within the anterior visual pathways on either side on today's evaluation.   We called her with her test results. Labs from 06/09/2015 showed normal A1c, normal vitamin D level, normal ANA, normal RF, normal ESR. CRP was elevated at 15.6. 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   She had a brain MRI with and without contrast on 06/23/2015: IMPRESSION:  This is a normal MRI of the brain with and without contrast In addition, personally reviewed the images through the PACS system. We called her with her test results.   I first met her on 05/22/2014 at the request of her neurosurgeon, Dr. Kathyrn Sheriff, at which time the patient reported intermittent right arm numbness, particularly with neck position changes. I suggested blood work and EMG and nerve conduction testing of the right upper extremity. Her blood work showed elevated B12 and B6 levels, indicative of B vitamin supplementation. Hemoglobin A1c was 5.7. We called her with her test results. She had EMG and nerve conduction testing on 06/02/2014: IMPRESSION:   Nerve conduction studies done on both upper extremities were unremarkable, without evidence of a neuropathy seen. EMG evaluation of the right upper extremity was unremarkable, without evidence of an overlying cervical radiculopathy. We called her with her test results. At the time, she reported improved symptoms.   05/22/2014: She has intermittent right arm numbness. Her symptoms have been going on for about 6 months. She has not noted any  permanent numbness and no issues elsewhere. She does not have any significant weakness and sometimes feels weak when the numbness seems to come on. It goes away if she changes positions or adjusts her neck position. She has had right shoulder problems and pain in the right shoulder.   You saw her on 05/14/2014 for neck pain. She had undergone physical therapy without improvement of her neck pain. She had cervical epidural steroid injections which helped for about 24 hours as understand.    She had a C-spine MRI without contrast on 01/07/2014: Mild cervical spondylosis as described above without significant disc protrusion, foraminal stenosis or central canal stenosis.   Blood work from 05/14/2014 was reviewed: She had a BMP, CBC with differential, liver function panel, lipid panel and TSH all of which were fine with the exception of a borderline LDL of 111.

## 2021-04-13 NOTE — Progress Notes (Signed)
Chief Complaint:   OBESITY Morgan Long is here to discuss her progress with her obesity treatment plan along with follow-up of her obesity related diagnoses. Morgan Long is on the Category 2 Plan and keeping a food journal and adhering to recommended goals of 400-550 calories and 40+ grams of protein at supper daily and states she is following her eating plan approximately 85-90% of the time. Morgan Long states she is walking for 60 minutes 3 times per week.  Today's visit was #: 5 Starting weight: 206 lbs Starting date: 02/02/2021 Today's weight: 205 lbs Today's date: 04/13/2021 Total lbs lost to date: 1 Total lbs lost since last in-office visit: 0  Interim History: Morgan Long's insurance used to [employer], but it does not any longer. She has tried [name], Contrave, and phentermine in the past.  Subjective:   1. Pre-diabetes Morgan Long is working on diet and exercise, but she is struggling with weight loss. She would like to start a GLP-1 again but her insurance has not covered much so far.   2. At risk for diabetes mellitus Morgan Long is at higher than average risk for developing diabetes due to obesity.   Assessment/Plan:   1. Pre-diabetes Favour agreed to start Ozempic 0.25 mg q weekly with no refills. She will continue to work on weight loss, exercise, and decreasing simple carbohydrates to help decrease the risk of diabetes.   - Semaglutide,0.25 or 0.5MG /DOS, (OZEMPIC, 0.25 OR 0.5 MG/DOSE,) 2 MG/1.5ML SOPN; Inject 0.5 mg into the skin once a week. Start at .25mg  per week  Dispense: 1.5 mL; Refill: 0  2. At risk for diabetes mellitus Morgan Long was given approximately 15 minutes of diabetes education and counseling today. We discussed intensive lifestyle modifications today with an emphasis on weight loss as well as increasing exercise and decreasing simple carbohydrates in her diet. We also reviewed medication options with an emphasis on risk versus benefit of those discussed.   Repetitive spaced learning was  employed today to elicit superior memory formation and behavioral change.  3. Obesity with current BMI 36.3 Morgan Long is currently in the action stage of change. As such, her goal is to continue with weight loss efforts. She has agreed to the Category 2 Plan.   Exercise goals: As is.  Behavioral modification strategies: increasing lean protein intake and meal planning and cooking strategies.  Morgan Long has agreed to follow-up with our clinic in 4 weeks. She was informed of the importance of frequent follow-up visits to maximize her success with intensive lifestyle modifications for her multiple health conditions.   Objective:   Blood pressure 113/74, pulse 75, temperature 98.6 F (37 C), height 5\' 3"  (1.6 m), weight 205 lb (93 kg), SpO2 99 %. Body mass index is 36.31 kg/m.  General: Cooperative, alert, well developed, in no acute distress. HEENT: Conjunctivae and lids unremarkable. Cardiovascular: Regular rhythm.  Lungs: Normal work of breathing. Neurologic: No focal deficits.   Lab Results  Component Value Date   CREATININE 0.85 02/21/2021   BUN 22 02/21/2021   NA 138 02/21/2021   K 4.4 02/21/2021   CL 108 02/21/2021   CO2 22 02/21/2021   Lab Results  Component Value Date   ALT 16 02/21/2021   AST 14 02/21/2021   ALKPHOS 65 02/21/2021   BILITOT 0.4 02/21/2021   Lab Results  Component Value Date   HGBA1C 5.9 (H) 02/02/2021   HGBA1C 5.9 (H) 05/06/2018   HGBA1C 5.8 (H) 01/23/2018   HGBA1C 5.5 08/14/2017   HGBA1C 5.6 06/09/2015  Lab Results  Component Value Date   INSULIN 12.8 02/02/2021   INSULIN 11.2 05/06/2018   INSULIN 8.4 01/23/2018   INSULIN 8.0 08/14/2017   Lab Results  Component Value Date   TSH 1.590 02/02/2021   Lab Results  Component Value Date   CHOL 201 (H) 02/02/2021   HDL 71 02/02/2021   LDLCALC 114 (H) 02/02/2021   TRIG 91 02/02/2021   CHOLHDL 3 05/14/2014   Lab Results  Component Value Date   VD25OH 44.8 02/02/2021   VD25OH 35.92 11/02/2020    VD25OH 34.0 05/06/2018   Lab Results  Component Value Date   WBC 7.1 02/21/2021   HGB 12.2 02/21/2021   HCT 37.6 02/21/2021   MCV 91.0 02/21/2021   PLT 355.0 02/21/2021   Lab Results  Component Value Date   FERRITIN 169 05/21/2019   Attestation Statements:   Reviewed by clinician on day of visit: allergies, medications, problem list, medical history, surgical history, family history, social history, and previous encounter notes.   I, Trixie Dredge, am acting as transcriptionist for Dennard Nip, MD.  I have reviewed the above documentation for accuracy and completeness, and I agree with the above. -  Dennard Nip, MD

## 2021-04-14 ENCOUNTER — Ambulatory Visit (INDEPENDENT_AMBULATORY_CARE_PROVIDER_SITE_OTHER): Payer: BC Managed Care – PPO

## 2021-04-14 ENCOUNTER — Ambulatory Visit: Payer: BC Managed Care – PPO

## 2021-04-14 DIAGNOSIS — Z23 Encounter for immunization: Secondary | ICD-10-CM

## 2021-04-15 ENCOUNTER — Other Ambulatory Visit: Payer: Self-pay | Admitting: Neurology

## 2021-04-15 ENCOUNTER — Encounter: Payer: Self-pay | Admitting: Family Medicine

## 2021-04-15 NOTE — Telephone Encounter (Signed)
It looks like pt was using voice to text. I think she meant she went from 100.6 then down to 100.2 then 100.4 and by 18 I think she achy? Please advise

## 2021-04-17 ENCOUNTER — Other Ambulatory Visit: Payer: Self-pay | Admitting: Family Medicine

## 2021-04-17 DIAGNOSIS — L232 Allergic contact dermatitis due to cosmetics: Secondary | ICD-10-CM

## 2021-04-24 ENCOUNTER — Encounter (INDEPENDENT_AMBULATORY_CARE_PROVIDER_SITE_OTHER): Payer: Self-pay | Admitting: Family Medicine

## 2021-04-25 NOTE — Telephone Encounter (Signed)
Pt last seen by Dr. Beasley.  

## 2021-05-03 ENCOUNTER — Encounter: Payer: Self-pay | Admitting: Family Medicine

## 2021-05-03 ENCOUNTER — Other Ambulatory Visit: Payer: Self-pay | Admitting: Family Medicine

## 2021-05-03 DIAGNOSIS — N760 Acute vaginitis: Secondary | ICD-10-CM

## 2021-05-03 MED ORDER — CLOTRIMAZOLE 2 % VA CREA: 1.0000 | TOPICAL_CREAM | Freq: Every day | VAGINAL | 0 refills | Status: DC

## 2021-05-03 MED ORDER — CLINDAMYCIN PHOSPHATE 1 % EX SOLN: Freq: Two times a day (BID) | CUTANEOUS | 5 refills | Status: DC

## 2021-05-03 MED ORDER — FLUCONAZOLE 150 MG PO TABS: ORAL_TABLET | ORAL | 0 refills | Status: DC

## 2021-05-11 ENCOUNTER — Encounter (INDEPENDENT_AMBULATORY_CARE_PROVIDER_SITE_OTHER): Payer: Self-pay

## 2021-05-11 ENCOUNTER — Ambulatory Visit (INDEPENDENT_AMBULATORY_CARE_PROVIDER_SITE_OTHER): Payer: BC Managed Care – PPO | Admitting: Family Medicine

## 2021-05-12 ENCOUNTER — Encounter (INDEPENDENT_AMBULATORY_CARE_PROVIDER_SITE_OTHER): Payer: Self-pay

## 2021-05-12 ENCOUNTER — Ambulatory Visit (INDEPENDENT_AMBULATORY_CARE_PROVIDER_SITE_OTHER): Payer: BC Managed Care – PPO | Admitting: Family Medicine

## 2021-05-13 ENCOUNTER — Encounter: Payer: Self-pay | Admitting: Plastic Surgery

## 2021-05-13 ENCOUNTER — Other Ambulatory Visit: Payer: Self-pay

## 2021-05-13 ENCOUNTER — Ambulatory Visit (INDEPENDENT_AMBULATORY_CARE_PROVIDER_SITE_OTHER): Payer: BC Managed Care – PPO | Admitting: Plastic Surgery

## 2021-05-13 DIAGNOSIS — L989 Disorder of the skin and subcutaneous tissue, unspecified: Secondary | ICD-10-CM | POA: Insufficient documentation

## 2021-05-13 NOTE — Progress Notes (Signed)
Patient ID: Morgan Long, female    DOB: 1971-03-17, 50 y.o.   MRN: [id number]   Chief Complaint  Patient presents with   Advice Only   Skin Problem    The patient is a 50 year old female here for evaluation of her face.  She noticed a changing skin lesion on her right several months ago.  It is hypopigmented, 3 mm in size and slightly raised.  She is not aware of any trauma to the area.  She is otherwise in good health.  She is a [employer].     Review of Systems  Constitutional: Negative.   HENT: Negative.    Eyes: Negative.   Respiratory: Negative.    Cardiovascular: Negative.   Gastrointestinal: Negative.   Endocrine: Negative.   Genitourinary: Negative.   Musculoskeletal: Negative.   Skin:  Positive for color change.  Neurological: Negative.   Hematological: Negative.   Psychiatric/Behavioral: Negative.     Past Medical History:  Diagnosis Date   Allergic rhinitis    Asthma    B12 deficiency    Chronic migraine    Dr. Catalina Gravel   Constipation    Dry eye syndrome of both lacrimal glands    Dry skin    Endometriosis    Fatigue    Gastroparesis    GERD (gastroesophageal reflux disease)    Headache(784.0)    occasional, dx w/ Migraines before, Topamax helps   Heartburn    Hepatic steatosis    History of stomach ulcers    Lactose intolerance    Localized edema    Migraine    Muscle weakness (generalized)    Nuclear cataract of both eyes    Mild   Overweight    Pain in right ankle and joints of right foot    Prediabetes    Shortness of breath    Sinus complaint    Sinusitis    Tired    TMJ pain dysfunction syndrome    occasional   Vitamin D deficiency     Past Surgical History:  Procedure Laterality Date   ANKLE SURGERY Left 11/16/2017   AUGMENTATION MAMMAPLASTY Bilateral 2006   BREAST ENHANCEMENT SURGERY  2006   BUNIONECTOMY     COLONOSCOPY     endrometroisis     fallopian tube removed     Left   LAPAROSCOPIC PARTIAL HEPATECTOMY     [facility]  --- due to hepatic tumor   UPPER GASTROINTESTINAL ENDOSCOPY     [facility] teeth extraction        Current Outpatient Medications:    albuterol (PROAIR HFA) 108 (90 Base) MCG/ACT inhaler, Inhale 2 puffs into the lungs every 6 (six) hours as needed for wheezing or shortness of breath., Disp: 18 g, Rfl: 2   cetirizine (ZYRTEC) 10 MG tablet, TAKE 1 TABLET BY MOUTH DAILY, Disp: 30 tablet, Rfl: 5   clindamycin (CLEOCIN T) 1 % external solution, Apply topically 2 (two) times daily., Disp: 30 mL, Rfl: 5   clotrimazole (GYNE-LOTRIMIN 3) 2 % vaginal cream, Place 1 Applicatorful vaginally at bedtime. For 3 days, Disp: 21 g, Rfl: 0   cyanocobalamin (,VITAMIN B-12,) 1000 MCG/ML injection, Inject 1,000 mcg into the muscle every 30 (thirty) days., Disp: , Rfl:    ESTROGEL 0.75 MG/1.25 GM (0.06%) topical gel, Apply 1 application topically daily., Disp: , Rfl: 0   etonogestrel-ethinyl estradiol (NUVARING) 0.12-0.015 MG/24HR vaginal ring, Place 1 each vaginally every 28 (twenty-eight) days. Insert vaginally and leave in place for  3 consecutive weeks, then remove for 1 week., Disp: , Rfl:    fluconazole (DIFLUCAN) 150 MG tablet, 1 po x1, may repeat in 3 days prn, Disp: 3 tablet, Rfl: 0   linaclotide (LINZESS) 145 MCG CAPS capsule, Take 1 capsule by mouth daily as needed for constipation., Disp: , Rfl:    linaclotide (LINZESS) 72 MCG capsule, Take 1 capsule (72 mcg total) by mouth daily before breakfast., Disp: 30 capsule, Rfl: 6   Multiple Vitamin (MULTIVITAMIN) LIQD, Take 5 mLs by mouth daily., Disp: , Rfl:    PREVIDENT 5000 SENSITIVE 1.1-5 % PSTE, USE BEFORE BED DO NOT RINSE, Disp: , Rfl:    promethazine-dextromethorphan (PROMETHAZINE-DM) 6.25-15 MG/5ML syrup, Take 5 mLs by mouth 4 (four) times daily as needed., Disp: 118 mL, Rfl: 0   Semaglutide,0.25 or 0.5MG /DOS, (OZEMPIC, 0.25 OR 0.5 MG/DOSE,) 2 MG/1.5ML SOPN, Inject 0.5 mg into the skin once a week. Start at .25mg  per week, Disp: 1.5 mL, Rfl: 0   tretinoin  (RETIN-A) 0.1 % cream, 1 application every evening., Disp: , Rfl:    triamcinolone cream (KENALOG) 0.1 %, APPLY TO AFFECTED AREA TWICE A DAY, Disp: 30 g, Rfl: 0   Ubrogepant ([name]) 50 MG TABS, Take 50 mg by mouth 2 (two) times daily as needed (1 pill at onset of migraine, may repeat in 2 hours, no more than 2 pills in 24 hours.)., Disp: 10 tablet, Rfl: 3   Vitamin D, Ergocalciferol, (DRISDOL) 1.25 MG (50000 UNIT) CAPS capsule, Take 1 capsule (50,000 Units total) by mouth every 7 (seven) days., Disp: 4 capsule, Rfl: 0   Objective:   Vitals:   05/13/21 1214  BP: 132/81  Pulse: 81  SpO2: 97%    Physical Exam Vitals and nursing note reviewed.  Constitutional:      Appearance: Normal appearance.  HENT:     Head: Normocephalic and atraumatic.   Cardiovascular:     Rate and Rhythm: Normal rate.     Pulses: Normal pulses.  Pulmonary:     Effort: Pulmonary effort is normal.  Skin:    General: Skin is warm.     Capillary Refill: Capillary refill takes less than 2 seconds.     Coloration: Skin is not jaundiced.     Findings: Lesion present. No bruising.  Neurological:     Mental Status: She is alert and oriented to person, place, and time.  Psychiatric:        Mood and Affect: Mood normal.        Behavior: Behavior normal.        Thought Content: Thought content normal.    Assessment & Plan:  Changing skin lesion  Plan for excision of changing skin lesion of right cheek.  We discussed risks of scar.    Pictures were obtained of the patient and placed in the chart with the patient's or guardian's permission.   Zeb, DO

## 2021-05-16 ENCOUNTER — Other Ambulatory Visit: Payer: Self-pay

## 2021-05-16 ENCOUNTER — Ambulatory Visit (INDEPENDENT_AMBULATORY_CARE_PROVIDER_SITE_OTHER): Payer: BC Managed Care – PPO | Admitting: Family Medicine

## 2021-05-16 ENCOUNTER — Encounter (INDEPENDENT_AMBULATORY_CARE_PROVIDER_SITE_OTHER): Payer: Self-pay | Admitting: Family Medicine

## 2021-05-16 VITALS — BP 113/70 | HR 91 | Temp 98.5°F | Ht 63.0 in | Wt 202.0 lb

## 2021-05-16 DIAGNOSIS — E66812 Obesity, class 2: Secondary | ICD-10-CM

## 2021-05-16 DIAGNOSIS — E559 Vitamin D deficiency, unspecified: Secondary | ICD-10-CM

## 2021-05-16 DIAGNOSIS — Z6836 Body mass index (BMI) 36.0-36.9, adult: Secondary | ICD-10-CM | POA: Diagnosis not present

## 2021-05-16 DIAGNOSIS — R7303 Prediabetes: Secondary | ICD-10-CM

## 2021-05-16 MED ORDER — VITAMIN D (ERGOCALCIFEROL) 1.25 MG (50000 UNIT) PO CAPS: 50000.0000 [IU] | ORAL_CAPSULE | ORAL | 0 refills | Status: DC

## 2021-05-16 MED ORDER — OZEMPIC (0.25 OR 0.5 MG/DOSE) 2 MG/1.5ML ~~LOC~~ SOPN
0.5000 mg | PEN_INJECTOR | SUBCUTANEOUS | 0 refills | Status: DC
Start: 2021-05-16 — End: 2021-06-14

## 2021-05-17 NOTE — Progress Notes (Signed)
Chief Complaint:   OBESITY Morgan Long is here to discuss her progress with her obesity treatment plan along with follow-up of her obesity related diagnoses. Morgan Long is on the Category 2 Plan and states she is following her eating plan approximately 85% of the time. Morgan Long states she is walking and doing squats 60 minutes 7 times per week.  Today's visit was #: 6 Starting weight: 206 lbs Starting date: 02/02/2021 Today's weight: 202 lbs Today's date: 05/16/2021 Total lbs lost to date: 4 Total lbs lost since last in-office visit: 3  Interim History: Morgan Long had noticed a slowing down of eating late at night. She is planning some new things for the new year. Pt is enjoying the category 2 plan for the most part. No changes need to be made to meal plan. She is getting in a significant amount of meat during the day.  Subjective:   1. Vitamin D deficiency Pt denies nausea, vomiting, and muscle weakness but notes fatigue. She is on prescription Vit D.  2. Pre-diabetes Pt's last A1c was 5.9 with an insulin level of 12.8. She is on Ozempic 0.25 mg.  Assessment/Plan:   1. Vitamin D deficiency Low Vitamin D level contributes to fatigue and are associated with obesity, breast, and colon cancer. She agrees to continue to take prescription Vitamin D 50,000 IU every week and will follow-up for routine testing of Vitamin D, at least 2-3 times per year to avoid over-replacement.  Refill- Vitamin D, Ergocalciferol, (DRISDOL) 1.25 MG (50000 UNIT) CAPS capsule; Take 1 capsule (50,000 Units total) by mouth every 7 (seven) days.  Dispense: 4 capsule; Refill: 0  2. Pre-diabetes Morgan Long will continue to work on weight loss, exercise, and decreasing simple carbohydrates to help decrease the risk of diabetes.   Increase & Refill- Semaglutide,0.25 or 0.5MG /DOS, (OZEMPIC, 0.25 OR 0.5 MG/DOSE,) 2 MG/1.5ML SOPN; Inject 0.5 mg into the skin once a week. Start at .25mg  per week  Dispense: 4.5 mL; Refill: 0  3. Obesity with  current BMI of 35.8  Morgan Long is currently in the action stage of change. As such, her goal is to continue with weight loss efforts. She has agreed to the Category 2 Plan.   Exercise goals: All adults should avoid inactivity. Some physical activity is better than none, and adults who participate in any amount of physical activity gain some health benefits.  Behavioral modification strategies: increasing lean protein intake, meal planning and cooking strategies, keeping healthy foods in the home, and planning for success.  Morgan Long has agreed to follow-up with our clinic in 3 weeks. She was informed of the importance of frequent follow-up visits to maximize her success with intensive lifestyle modifications for her multiple health conditions.   Objective:   Blood pressure 113/70, pulse 91, temperature 98.5 F (36.9 C), height 5\' 3"  (1.6 m), weight 202 lb (91.6 kg), SpO2 99 %. Body mass index is 35.78 kg/m.  General: Cooperative, alert, well developed, in no acute distress. HEENT: Conjunctivae and lids unremarkable. Cardiovascular: Regular rhythm.  Lungs: Normal work of breathing. Neurologic: No focal deficits.   Lab Results  Component Value Date   CREATININE 0.85 02/21/2021   BUN 22 02/21/2021   NA 138 02/21/2021   K 4.4 02/21/2021   CL 108 02/21/2021   CO2 22 02/21/2021   Lab Results  Component Value Date   ALT 16 02/21/2021   AST 14 02/21/2021   ALKPHOS 65 02/21/2021   BILITOT 0.4 02/21/2021   Lab Results  Component  Value Date   HGBA1C 5.9 (H) 02/02/2021   HGBA1C 5.9 (H) 05/06/2018   HGBA1C 5.8 (H) 01/23/2018   HGBA1C 5.5 08/14/2017   HGBA1C 5.6 06/09/2015   Lab Results  Component Value Date   INSULIN 12.8 02/02/2021   INSULIN 11.2 05/06/2018   INSULIN 8.4 01/23/2018   INSULIN 8.0 08/14/2017   Lab Results  Component Value Date   TSH 1.590 02/02/2021   Lab Results  Component Value Date   CHOL 201 (H) 02/02/2021   HDL 71 02/02/2021   LDLCALC 114 (H) 02/02/2021    TRIG 91 02/02/2021   CHOLHDL 3 05/14/2014   Lab Results  Component Value Date   VD25OH 44.8 02/02/2021   VD25OH 35.92 11/02/2020   VD25OH 34.0 05/06/2018   Lab Results  Component Value Date   WBC 7.1 02/21/2021   HGB 12.2 02/21/2021   HCT 37.6 02/21/2021   MCV 91.0 02/21/2021   PLT 355.0 02/21/2021   Lab Results  Component Value Date   FERRITIN 169 05/21/2019   Attestation Statements:   Reviewed by clinician on day of visit: allergies, medications, problem list, medical history, surgical history, family history, social history, and previous encounter notes.  Coral Ceo, CMA, am acting as transcriptionist for Coralie Common, MD.  I have reviewed the above documentation for accuracy and completeness, and I agree with the above. - Coralie Common, MD

## 2021-05-18 DIAGNOSIS — M25571 Pain in right ankle and joints of right foot: Secondary | ICD-10-CM | POA: Diagnosis not present

## 2021-05-18 DIAGNOSIS — M25572 Pain in left ankle and joints of left foot: Secondary | ICD-10-CM | POA: Diagnosis not present

## 2021-05-18 DIAGNOSIS — G8929 Other chronic pain: Secondary | ICD-10-CM | POA: Diagnosis not present

## 2021-05-19 ENCOUNTER — Other Ambulatory Visit: Payer: Self-pay | Admitting: Family Medicine

## 2021-05-19 ENCOUNTER — Encounter: Payer: Self-pay | Admitting: Family Medicine

## 2021-05-19 DIAGNOSIS — N760 Acute vaginitis: Secondary | ICD-10-CM

## 2021-05-19 MED ORDER — TERCONAZOLE 0.8 % VA CREA: 1.0000 | TOPICAL_CREAM | Freq: Every day | VAGINAL | 0 refills | Status: DC

## 2021-05-20 ENCOUNTER — Other Ambulatory Visit: Payer: Self-pay | Admitting: Family Medicine

## 2021-05-20 ENCOUNTER — Other Ambulatory Visit: Payer: Self-pay | Admitting: Neurology

## 2021-05-20 ENCOUNTER — Encounter: Payer: Self-pay | Admitting: Family Medicine

## 2021-05-20 DIAGNOSIS — R112 Nausea with vomiting, unspecified: Secondary | ICD-10-CM

## 2021-05-20 DIAGNOSIS — R059 Cough, unspecified: Secondary | ICD-10-CM

## 2021-05-23 NOTE — Telephone Encounter (Signed)
Pt has appointment tomorrow.

## 2021-05-24 ENCOUNTER — Encounter: Payer: Self-pay | Admitting: Family Medicine

## 2021-05-24 ENCOUNTER — Ambulatory Visit: Payer: BC Managed Care – PPO | Admitting: Family Medicine

## 2021-05-25 ENCOUNTER — Ambulatory Visit (INDEPENDENT_AMBULATORY_CARE_PROVIDER_SITE_OTHER): Payer: BC Managed Care – PPO | Admitting: Family

## 2021-05-25 VITALS — BP 110/65 | HR 84 | Temp 98.3°F | Resp 16 | Ht 64.0 in | Wt 206.4 lb

## 2021-05-25 DIAGNOSIS — T148XXA Other injury of unspecified body region, initial encounter: Secondary | ICD-10-CM | POA: Diagnosis not present

## 2021-05-25 DIAGNOSIS — R059 Cough, unspecified: Secondary | ICD-10-CM

## 2021-05-25 DIAGNOSIS — J029 Acute pharyngitis, unspecified: Secondary | ICD-10-CM

## 2021-05-25 DIAGNOSIS — J020 Streptococcal pharyngitis: Secondary | ICD-10-CM | POA: Diagnosis not present

## 2021-05-25 LAB — POCT INFLUENZA A/B
Influenza A, POC: NEGATIVE
Influenza B, POC: NEGATIVE

## 2021-05-25 MED ORDER — PROMETHAZINE-DM 6.25-15 MG/5ML PO SYRP: 5.0000 mL | ORAL_SOLUTION | Freq: Four times a day (QID) | ORAL | 0 refills | Status: DC | PRN

## 2021-05-25 MED ORDER — AMOXICILLIN 500 MG PO CAPS: 500.0000 mg | ORAL_CAPSULE | Freq: Three times a day (TID) | ORAL | 0 refills | Status: AC

## 2021-05-25 NOTE — Assessment & Plan Note (Signed)
Mild swelling overlying right shin, residual hematoma versus scar tissue. Reassurance provided.

## 2021-05-25 NOTE — Assessment & Plan Note (Signed)
New. Will rx with amoxicillin.  Flu testing is negative. Covid testing is negative.  Pt is advised to call if symptoms worsen or if symptoms fail to improve.

## 2021-05-25 NOTE — Progress Notes (Signed)
Subjective:   By signing my name below, I, Morgan Long, attest that this documentation has been prepared under the direction and in the presence of Morgan Long. 05/25/2021    Patient ID: Morgan Long, female    DOB: [birth year], 50 y.o.   MRN: [id number]  Chief Complaint  Patient presents with   Sore Throat    Complains of flu and strep exposure    Sore Throat  Associated symptoms include coughing.  Patient is in today for a office visit.  Scratchy throat- She complains feeling scratchy throat and cough. Her coughing is worse at night. She reports her nephew has flu and strept throat and she thinks he drank out of the same cup she did. She denies having any fevers at this time. Knot on leg- She reports having a knot on her right lower leg. She recently recovered from bruising all over her right lower leg after a TV had fallen on it.    Health Maintenance Due  Topic Date Due   Pneumococcal Vaccine 14-66 Years old (2 - PCV) 01/23/2020   PAP SMEAR-Modifier  05/07/2020   COVID-19 Vaccine (4 - Booster for Pfizer series) 05/07/2020    Past Medical History:  Diagnosis Date   Allergic rhinitis    Asthma    B12 deficiency    Chronic migraine    Dr. Catalina Gravel   Constipation    Dry eye syndrome of both lacrimal glands    Dry skin    Endometriosis    Fatigue    Gastroparesis    GERD (gastroesophageal reflux disease)    Headache(784.0)    occasional, dx w/ Migraines before, Topamax helps   Heartburn    Hepatic steatosis    History of stomach ulcers    Lactose intolerance    Localized edema    Migraine    Muscle weakness (generalized)    Nuclear cataract of both eyes    Mild   Overweight    Pain in right ankle and joints of right foot    Prediabetes    Shortness of breath    Sinus complaint    Sinusitis    Tired    TMJ pain dysfunction syndrome    occasional   Vitamin D deficiency     Past Surgical History:  Procedure Laterality Date   ANKLE SURGERY  Left 11/16/2017   AUGMENTATION MAMMAPLASTY Bilateral 2006   BREAST ENHANCEMENT SURGERY  2006   BUNIONECTOMY     COLONOSCOPY     endrometroisis     fallopian tube removed     Left   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   UPPER GASTROINTESTINAL ENDOSCOPY     [facility] teeth extraction      Family History  Problem Relation Age of Onset   Throat cancer Mother    Cancer - Other Mother 24       throat - died 81 months   Cancer Mother    Alcoholism Mother    Stroke Father    Heart disease Father    Cancer Father    Colon cancer Paternal Grandfather    Breast cancer Maternal Aunt        breast   Breast cancer Maternal Aunt        breast   Pancreatic cancer Maternal Aunt    Hypertension Maternal Aunt        several family members   Breast cancer Maternal Aunt  total of 5 aunts   Breast cancer Maternal Aunt    Colon cancer Maternal Uncle 70       died 75   Throat cancer Maternal Uncle    Diabetes Other        grandmother   Asthma Other        cousin, maternal   Heart attack Neg Hx    Rectal cancer Neg Hx    Stomach cancer Neg Hx     Social History   Socioeconomic History   Marital status: Single    Spouse name: Not on file   Number of children: 0   Years of education: BS   Highest education level: Not on file  Occupational History   Occupation: Paramedic, going to school    Employer: [employer]   Occupation: Air traffic controller  Tobacco Use   Smoking status: Never   Smokeless tobacco: Never  Vaping Use   Vaping Use: Never used  Substance and Sexual Activity   Alcohol use: Not Currently    Comment: socially - occasional    Drug use: No   Sexual activity: Yes    Birth control/protection: Inserts    Comment: nuvaring  Other Topics Concern   Not on file  Social History Narrative   Household:sister and her 3 kids    Drinks occasional starbucks drink       Social Determinants of Health   Financial  Resource Strain: Not on file  Food Insecurity: Not on file  Transportation Needs: Not on file  Physical Activity: Not on file  Stress: Not on file  Social Connections: Not on file  Intimate Partner Violence: Not on file    Outpatient Medications Prior to Visit  Medication Sig Dispense Refill   albuterol (PROAIR HFA) 108 (90 Base) MCG/ACT inhaler Inhale 2 puffs into the lungs every 6 (six) hours as needed for wheezing or shortness of breath. 18 g 2   cetirizine (ZYRTEC) 10 MG tablet TAKE 1 TABLET BY MOUTH DAILY 30 tablet 5   clindamycin (CLEOCIN T) 1 % external solution Apply topically 2 (two) times daily. 30 mL 5   clotrimazole (GYNE-LOTRIMIN 3) 2 % vaginal cream Place 1 Applicatorful vaginally at bedtime. For 3 days 21 g 0   cyanocobalamin (,VITAMIN B-12,) 1000 MCG/ML injection Inject 1,000 mcg into the muscle every 30 (thirty) days.     ESTROGEL 0.75 MG/1.25 GM (0.06%) topical gel Apply 1 application topically daily.  0   etonogestrel-ethinyl estradiol (NUVARING) 0.12-0.015 MG/24HR vaginal ring Place 1 each vaginally every 28 (twenty-eight) days. Insert vaginally and leave in place for 3 consecutive weeks, then remove for 1 week.     fluconazole (DIFLUCAN) 150 MG tablet 1 po x1, may repeat in 3 days prn 3 tablet 0   linaclotide (LINZESS) 145 MCG CAPS capsule Take 1 capsule by mouth daily as needed for constipation.     linaclotide (LINZESS) 72 MCG capsule Take 1 capsule (72 mcg total) by mouth daily before breakfast. 30 capsule 6   Multiple Vitamin (MULTIVITAMIN) LIQD Take 5 mLs by mouth daily.     PREVIDENT 5000 SENSITIVE 1.1-5 % PSTE USE BEFORE BED DO NOT RINSE     Semaglutide,0.25 or 0.5MG /DOS, (OZEMPIC, 0.25 OR 0.5 MG/DOSE,) 2 MG/1.5ML SOPN Inject 0.5 mg into the skin once a week. Start at .25mg  per week 4.5 mL 0   terconazole (TERAZOL 3) 0.8 % vaginal cream Place 1 applicator vaginally at bedtime. 20 g 0   tretinoin (  RETIN-A) 0.1 % cream 1 application every evening.     triamcinolone  cream (KENALOG) 0.1 % APPLY TO AFFECTED AREA TWICE A DAY 30 g 0   [name] 50 MG TABS 1 PILL AT ONSET OF MIGRAINE, MAY REPEAT IN 2 HOURS, NO MORE THAN 2 PILLS IN 24 HOURS.). 10 tablet 3   Vitamin D, Ergocalciferol, (DRISDOL) 1.25 MG (50000 UNIT) CAPS capsule Take 1 capsule (50,000 Units total) by mouth every 7 (seven) days. 4 capsule 0   promethazine-dextromethorphan (PROMETHAZINE-DM) 6.25-15 MG/5ML syrup Take 5 mLs by mouth 4 (four) times daily as needed. 118 mL 0   No facility-administered medications prior to visit.    Allergies  Allergen Reactions   Acetaminophen-Codeine Itching   Hydrocodone Nausea Only   Oxycodone Itching   Tyloxapol     Other reaction(s): Unknown   Codeine Itching and Nausea Only    Review of Systems  HENT:         (+)scratchy throat  Respiratory:  Positive for cough.   Musculoskeletal:        (+)knot on left lower leg      Objective:    Physical Exam Constitutional:      General: She is not in acute distress.    Appearance: Normal appearance. She is not ill-appearing.  HENT:     Head: Normocephalic and atraumatic.     Right Ear: Tympanic membrane, ear canal and external ear normal.     Left Ear: External ear normal. There is impacted cerumen.     Mouth/Throat:     Mouth: Mucous membranes are moist.     Pharynx: Oropharynx is clear. No oropharyngeal exudate or posterior oropharyngeal erythema.  Eyes:     Extraocular Movements: Extraocular movements intact.     Pupils: Pupils are equal, round, and reactive to light.  Cardiovascular:     Rate and Rhythm: Normal rate and regular rhythm.     Heart sounds: Normal heart sounds. No murmur heard.   No gallop.  Pulmonary:     Effort: Pulmonary effort is normal. No respiratory distress.     Breath sounds: Normal breath sounds. No wheezing or rales.  Skin:    General: Skin is warm and dry.  Neurological:     Mental Status: She is alert and oriented to person, place, and time.  Psychiatric:         Behavior: Behavior normal.        Judgment: Judgment normal.    BP 110/65 (BP Location: Right Arm, Patient Position: Sitting, Cuff Size: Small)    Pulse 84    Temp 98.3 F (36.8 C) (Oral)    Resp 16    Ht 5\' 4"  (1.626 m)    Wt 206 lb 6.4 oz (93.6 kg)    SpO2 100%    BMI 35.43 kg/m  Wt Readings from Last 3 Encounters:  05/25/21 206 lb 6.4 oz (93.6 kg)  05/16/21 202 lb (91.6 kg)  05/13/21 206 lb 12.8 oz (93.8 kg)       Assessment & Plan:   Problem List Items Addressed This Visit       Unprioritized   Strep pharyngitis    New. Will rx with amoxicillin.  Flu testing is negative. Covid testing is negative.  Pt is advised to call if symptoms worsen or if symptoms fail to improve.       Sore throat - Primary   Relevant Orders   POCT rapid strep A   POC COVID-19   POCT  Influenza A/B (Completed)   Hematoma    Mild swelling overlying right shin, residual hematoma versus scar tissue. Reassurance provided.       Other Visit Diagnoses     Cough in adult       Relevant Medications   promethazine-dextromethorphan (PROMETHAZINE-DM) 6.25-15 MG/5ML syrup        Meds ordered this encounter  Medications   amoxicillin (AMOXIL) 500 MG capsule    Sig: Take 1 capsule (500 mg total) by mouth 3 (three) times daily for 10 days.    Dispense:  30 capsule    Refill:  0    Order Specific Question:   Supervising Provider    Answer:   Penni Homans A [[id number]]   promethazine-dextromethorphan (PROMETHAZINE-DM) 6.25-15 MG/5ML syrup    Sig: Take 5 mLs by mouth 4 (four) times daily as needed.    Dispense:  118 mL    Refill:  0    Order Specific Question:   Supervising Provider    Answer:   Penni Homans A [[id number]]    I, Morgan Long, personally preformed the services described in this documentation.  All medical record entries made by the scribe were at my direction and in my presence.  I have reviewed the chart and discharge instructions (if applicable) and agree that the record  reflects my personal performance and is accurate and complete. 05/25/2021   I,Morgan Long,acting as a Education administrator for Nance Pear, Long.,have documented all relevant documentation on the behalf of Nance Pear, Long,as directed by  Nance Pear, Long while in the presence of Nance Pear, Long.   Nance Pear, Long

## 2021-05-25 NOTE — Patient Instructions (Signed)
Please begin amoxicillin for strep throat. Throw out your tooth brush after 24 hours on antibiotics and get a new one. You may use promethazine DM cough syrup as needed for cough.  Call if symptoms worsen or if symptoms fail to improve.

## 2021-05-26 LAB — POC COVID19 BINAXNOW: SARS Coronavirus 2 Ag: NEGATIVE

## 2021-05-26 LAB — POCT RAPID STREP A (OFFICE): Rapid Strep A Screen: POSITIVE — AB

## 2021-05-31 ENCOUNTER — Encounter: Payer: Self-pay | Admitting: Plastic Surgery

## 2021-06-01 ENCOUNTER — Encounter: Payer: Self-pay | Admitting: Family Medicine

## 2021-06-02 NOTE — Telephone Encounter (Signed)
OV

## 2021-06-08 ENCOUNTER — Encounter: Payer: Self-pay | Admitting: Plastic Surgery

## 2021-06-13 ENCOUNTER — Ambulatory Visit (INDEPENDENT_AMBULATORY_CARE_PROVIDER_SITE_OTHER): Payer: BC Managed Care – PPO | Admitting: Family Medicine

## 2021-06-13 ENCOUNTER — Encounter: Payer: Self-pay | Admitting: Family Medicine

## 2021-06-13 ENCOUNTER — Other Ambulatory Visit: Payer: Self-pay | Admitting: Family Medicine

## 2021-06-13 DIAGNOSIS — J0140 Acute pansinusitis, unspecified: Secondary | ICD-10-CM

## 2021-06-13 MED ORDER — CEFDINIR 300 MG PO CAPS: 300.0000 mg | ORAL_CAPSULE | Freq: Two times a day (BID) | ORAL | 0 refills | Status: DC

## 2021-06-13 NOTE — Telephone Encounter (Signed)
Pt seen on 05/16/21 with Melissa. Another OV/virtual? Please advise

## 2021-06-14 ENCOUNTER — Other Ambulatory Visit (INDEPENDENT_AMBULATORY_CARE_PROVIDER_SITE_OTHER): Payer: Self-pay | Admitting: Family Medicine

## 2021-06-14 ENCOUNTER — Ambulatory Visit (INDEPENDENT_AMBULATORY_CARE_PROVIDER_SITE_OTHER): Payer: 59 | Admitting: Family Medicine

## 2021-06-14 ENCOUNTER — Other Ambulatory Visit: Payer: Self-pay

## 2021-06-14 ENCOUNTER — Encounter (INDEPENDENT_AMBULATORY_CARE_PROVIDER_SITE_OTHER): Payer: Self-pay | Admitting: Family Medicine

## 2021-06-14 VITALS — BP 103/70 | HR 86 | Temp 98.6°F | Ht 62.0 in | Wt 196.0 lb

## 2021-06-14 DIAGNOSIS — R7303 Prediabetes: Secondary | ICD-10-CM

## 2021-06-14 DIAGNOSIS — Z6836 Body mass index (BMI) 36.0-36.9, adult: Secondary | ICD-10-CM

## 2021-06-14 DIAGNOSIS — Z6834 Body mass index (BMI) 34.0-34.9, adult: Secondary | ICD-10-CM | POA: Diagnosis not present

## 2021-06-14 DIAGNOSIS — Z9189 Other specified personal risk factors, not elsewhere classified: Secondary | ICD-10-CM | POA: Diagnosis not present

## 2021-06-14 MED ORDER — OZEMPIC (0.25 OR 0.5 MG/DOSE) 2 MG/1.5ML ~~LOC~~ SOPN: 0.5000 mg | PEN_INJECTOR | SUBCUTANEOUS | 0 refills | Status: DC

## 2021-06-15 NOTE — Telephone Encounter (Signed)
Send in victoza 0.6 qday x 1 month and inform pt insurance will not cover original med and this one is similar but this is daily

## 2021-06-15 NOTE — Telephone Encounter (Signed)
Please see message and advise.  Thank you.  Pharmacy requesting an Alternative.

## 2021-06-15 NOTE — Progress Notes (Signed)
Chief Complaint:   OBESITY Morgan Long is here to discuss her progress with her obesity treatment plan along with follow-up of her obesity related diagnoses. Morgan Long is on the Category 2 Plan and states she is following her eating plan approximately 95% of the time. Morgan Long states she is walking for 30 minutes 3 times per week.  Today's visit was #: 7 Starting weight: 206 lbs Starting date: 02/02/2021 Today's weight: 196 lbs Today's date: 06/14/2021 Total lbs lost to date: 10 Total lbs lost since last in-office visit: 6  Interim History: Morgan Long continues to do well with weight loss. Her hunger is controlled and she is doing well with diet and exercise.  Subjective:   1. Pre-diabetes Morgan Long is doing well on Ozempic with no nausea or vomiting. She notes decreased polyphagia. She is working on diet and exercise.  2. At risk for diabetes mellitus Morgan Long is at higher than average risk for developing diabetes due to obesity.   Assessment/Plan:   1. Pre-diabetes Lamyah will continue to work on weight loss, exercise, and decreasing simple carbohydrates to help decrease the risk of diabetes. We will refill Ozempic 0.5 mg weekly for 1 month.  - Semaglutide,0.25 or 0.5MG /DOS, (OZEMPIC, 0.25 OR 0.5 MG/DOSE,) 2 MG/1.5ML SOPN; Inject 0.5 mg into the skin once a week.  Dispense: 4.5 mL; Refill: 0  2. At risk for diabetes mellitus Morgan Long was given approximately 15 minutes of diabetes education and counseling today. We discussed intensive lifestyle modifications today with an emphasis on weight loss as well as increasing exercise and decreasing simple carbohydrates in her diet. We also reviewed medication options with an emphasis on risk versus benefit of those discussed.   Repetitive spaced learning was employed today to elicit superior memory formation and behavioral change.  3. Obesity with current BMI of 34.9 Morgan Long is currently in the action stage of change. As such, her goal is to continue with weight loss  efforts. She has agreed to the Category 2 Plan.   Exercise goals: As is.  Behavioral modification strategies: increasing lean protein intake and decreasing simple carbohydrates.  Morgan Long has agreed to follow-up with our clinic in 3 weeks. She was informed of the importance of frequent follow-up visits to maximize her success with intensive lifestyle modifications for her multiple health conditions.   Objective:   Blood pressure 103/70, pulse 86, temperature 98.6 F (37 C), temperature source Oral, height 5\' 2"  (1.575 m), weight 196 lb (88.9 kg), SpO2 98 %. Body mass index is 35.85 kg/m.  General: Cooperative, alert, well developed, in no acute distress. HEENT: Conjunctivae and lids unremarkable. Cardiovascular: Regular rhythm.  Lungs: Normal work of breathing. Neurologic: No focal deficits.   Lab Results  Component Value Date   CREATININE 0.85 02/21/2021   BUN 22 02/21/2021   NA 138 02/21/2021   K 4.4 02/21/2021   CL 108 02/21/2021   CO2 22 02/21/2021   Lab Results  Component Value Date   ALT 16 02/21/2021   AST 14 02/21/2021   ALKPHOS 65 02/21/2021   BILITOT 0.4 02/21/2021   Lab Results  Component Value Date   HGBA1C 5.9 (H) 02/02/2021   HGBA1C 5.9 (H) 05/06/2018   HGBA1C 5.8 (H) 01/23/2018   HGBA1C 5.5 08/14/2017   HGBA1C 5.6 06/09/2015   Lab Results  Component Value Date   INSULIN 12.8 02/02/2021   INSULIN 11.2 05/06/2018   INSULIN 8.4 01/23/2018   INSULIN 8.0 08/14/2017   Lab Results  Component Value Date  TSH 1.590 02/02/2021   Lab Results  Component Value Date   CHOL 201 (H) 02/02/2021   HDL 71 02/02/2021   LDLCALC 114 (H) 02/02/2021   TRIG 91 02/02/2021   CHOLHDL 3 05/14/2014   Lab Results  Component Value Date   VD25OH 44.8 02/02/2021   VD25OH 35.92 11/02/2020   VD25OH 34.0 05/06/2018   Lab Results  Component Value Date   WBC 7.1 02/21/2021   HGB 12.2 02/21/2021   HCT 37.6 02/21/2021   MCV 91.0 02/21/2021   PLT 355.0 02/21/2021   Lab  Results  Component Value Date   FERRITIN 169 05/21/2019   Attestation Statements:   Reviewed by clinician on day of visit: allergies, medications, problem list, medical history, surgical history, family history, social history, and previous encounter notes.   I, Trixie Dredge, am acting as transcriptionist for Dennard Nip, MD.  I have reviewed the above documentation for accuracy and completeness, and I agree with the above. -  Dennard Nip, MD

## 2021-06-16 ENCOUNTER — Encounter (INDEPENDENT_AMBULATORY_CARE_PROVIDER_SITE_OTHER): Payer: Self-pay

## 2021-06-16 NOTE — Telephone Encounter (Signed)
Sent message for pt to call office.

## 2021-06-16 NOTE — Telephone Encounter (Signed)
Please have patient to schedule an appointment (virtual ok) to go over these or we can do it at her next visit.

## 2021-06-20 NOTE — Telephone Encounter (Signed)
PA ignited today for Victoza 18MG /#ML pen-injectors.     Your information has been submitted to [place]. To check for an updated outcome later, reopen this PA request from your [facility]

## 2021-06-21 ENCOUNTER — Telehealth: Payer: Self-pay | Admitting: Neurology

## 2021-06-21 DIAGNOSIS — Z719 Counseling, unspecified: Secondary | ICD-10-CM

## 2021-06-21 NOTE — Telephone Encounter (Signed)
Patient has a Botox appointment 2/8. Her insurance has changed from [place] to [place]. I have completed [employer] PA form and faxed to plan. Dx: [phone].

## 2021-06-22 ENCOUNTER — Telehealth (INDEPENDENT_AMBULATORY_CARE_PROVIDER_SITE_OTHER): Payer: 59 | Admitting: Family Medicine

## 2021-06-22 ENCOUNTER — Encounter (INDEPENDENT_AMBULATORY_CARE_PROVIDER_SITE_OTHER): Payer: Self-pay | Admitting: Family Medicine

## 2021-06-22 DIAGNOSIS — R7303 Prediabetes: Secondary | ICD-10-CM | POA: Diagnosis not present

## 2021-06-22 DIAGNOSIS — Z6834 Body mass index (BMI) 34.0-34.9, adult: Secondary | ICD-10-CM | POA: Diagnosis not present

## 2021-06-22 NOTE — Progress Notes (Signed)
TeleHealth Visit:  Due to the COVID-19 pandemic, this visit was completed with telemedicine (audio/video) technology to reduce patient and provider exposure as well as to preserve personal protective equipment.   Morgan Long has verbally consented to this TeleHealth visit. The patient is located at home, the provider is located at the [facility] office. The participants in this visit include the listed provider and patient. The visit was conducted today via MyChart video.   Chief Complaint: OBESITY Morgan Long is here to discuss her progress with her obesity treatment plan along with follow-up of her obesity related diagnoses. Morgan Long is on the Category 2 Plan and states she is following her eating plan approximately 90-95% of the time. Morgan Long states she is doing 0 minutes 0 times per week.  Today's visit was #: 8 Starting weight: 206 lbs Starting date: 02/02/2021  Interim History: Sharnelle has been working on diet, exercise, and weight loss. She is back to following her Category 2 plan. No problems were mentioned today.  Subjective:   1. Pre-diabetes Morgan Long has changed insurance companies, and her new insurance will not cover Ozempic. It will cover Victoza, but she has questions about the differences in medicines as well as side effects, etc.  Assessment/Plan:   1. Pre-diabetes Morgan Long was informed of the difference in Victoza, especially the need to take in the morning with food to avoid GI upset. She will finish the Ozempic that she has currently and then change over. She will continue to work on weight loss, exercise, and decreasing simple carbohydrates to help decrease the risk of diabetes.   2. Obesity with current BMI of 34.9 Morgan Long is currently in the action stage of change. As such, her goal is to continue with weight loss efforts. She has agreed to the Category 2 Plan.   Behavioral modification strategies: no skipping meals.  Morgan Long has agreed to follow-up with our clinic in 3 weeks.  She was informed of the importance of frequent follow-up visits to maximize her success with intensive lifestyle modifications for her multiple health conditions.  Objective:   VITALS: Per patient if applicable, see vitals. GENERAL: Alert and in no acute distress. CARDIOPULMONARY: No increased WOB. Speaking in clear sentences.  PSYCH: Pleasant and cooperative. Speech normal rate and rhythm. Affect is appropriate. Insight and judgement are appropriate. Attention is focused, linear, and appropriate.  NEURO: Oriented as arrived to appointment on time with no prompting.   Lab Results  Component Value Date   CREATININE 0.85 02/21/2021   BUN 22 02/21/2021   NA 138 02/21/2021   K 4.4 02/21/2021   CL 108 02/21/2021   CO2 22 02/21/2021   Lab Results  Component Value Date   ALT 16 02/21/2021   AST 14 02/21/2021   ALKPHOS 65 02/21/2021   BILITOT 0.4 02/21/2021   Lab Results  Component Value Date   HGBA1C 5.9 (H) 02/02/2021   HGBA1C 5.9 (H) 05/06/2018   HGBA1C 5.8 (H) 01/23/2018   HGBA1C 5.5 08/14/2017   HGBA1C 5.6 06/09/2015   Lab Results  Component Value Date   INSULIN 12.8 02/02/2021   INSULIN 11.2 05/06/2018   INSULIN 8.4 01/23/2018   INSULIN 8.0 08/14/2017   Lab Results  Component Value Date   TSH 1.590 02/02/2021   Lab Results  Component Value Date   CHOL 201 (H) 02/02/2021   HDL 71 02/02/2021   LDLCALC 114 (H) 02/02/2021   TRIG 91 02/02/2021   CHOLHDL 3 05/14/2014   Lab Results  Component  Value Date   VD25OH 44.8 02/02/2021   VD25OH 35.92 11/02/2020   VD25OH 34.0 05/06/2018   Lab Results  Component Value Date   WBC 7.1 02/21/2021   HGB 12.2 02/21/2021   HCT 37.6 02/21/2021   MCV 91.0 02/21/2021   PLT 355.0 02/21/2021   Lab Results  Component Value Date   FERRITIN 169 05/21/2019    Attestation Statements:   Reviewed by clinician on day of visit: allergies, medications, problem list, medical history, surgical history, family history, social history,  and previous encounter notes.   I, Trixie Dredge, am acting as transcriptionist for Dennard Nip, MD.  I have reviewed the above documentation for accuracy and completeness, and I agree with the above. - Dennard Nip, MD

## 2021-06-23 DIAGNOSIS — R7303 Prediabetes: Secondary | ICD-10-CM | POA: Insufficient documentation

## 2021-06-23 DIAGNOSIS — E559 Vitamin D deficiency, unspecified: Secondary | ICD-10-CM | POA: Insufficient documentation

## 2021-06-23 DIAGNOSIS — E538 Deficiency of other specified B group vitamins: Secondary | ICD-10-CM | POA: Insufficient documentation

## 2021-06-27 ENCOUNTER — Encounter (INDEPENDENT_AMBULATORY_CARE_PROVIDER_SITE_OTHER): Payer: Self-pay | Admitting: Family Medicine

## 2021-06-28 ENCOUNTER — Ambulatory Visit: Payer: 59 | Admitting: Cardiology

## 2021-06-28 ENCOUNTER — Telehealth: Payer: Self-pay | Admitting: Cardiology

## 2021-06-28 ENCOUNTER — Other Ambulatory Visit: Payer: Self-pay

## 2021-06-28 ENCOUNTER — Encounter: Payer: Self-pay | Admitting: Cardiology

## 2021-06-28 ENCOUNTER — Other Ambulatory Visit (INDEPENDENT_AMBULATORY_CARE_PROVIDER_SITE_OTHER): Payer: Self-pay

## 2021-06-28 VITALS — BP 115/70 | HR 80 | Ht 62.0 in | Wt 200.0 lb

## 2021-06-28 DIAGNOSIS — R7303 Prediabetes: Secondary | ICD-10-CM

## 2021-06-28 DIAGNOSIS — R072 Precordial pain: Secondary | ICD-10-CM | POA: Diagnosis not present

## 2021-06-28 DIAGNOSIS — I493 Ventricular premature depolarization: Secondary | ICD-10-CM | POA: Diagnosis not present

## 2021-06-28 DIAGNOSIS — R0602 Shortness of breath: Secondary | ICD-10-CM

## 2021-06-28 MED ORDER — LIRAGLUTIDE 18 MG/3ML ~~LOC~~ SOPN: 0.6000 mg | PEN_INJECTOR | Freq: Every day | SUBCUTANEOUS | 0 refills | Status: DC

## 2021-06-28 NOTE — Progress Notes (Signed)
Call pharmacy and verify, 60 days is unusual

## 2021-06-28 NOTE — Progress Notes (Signed)
Cardiology Office Note:    Date:  06/28/2021   ID:  Morgan Long, DOB 16-Jan-1971, MRN [id number]  PCP:  Carollee Herter, Alferd Apa, DO  Cardiologist:  [name] More, MD    Referring MD: Carollee Herter, Alferd Apa, *    ASSESSMENT:    1. PVC (premature ventricular contraction)   2. Shortness of breath   3. Precordial pain    PLAN:    In order of problems listed above:  Improved I told her over-the-counter magnesium to be beneficial and she will add this to her regimen at this time does not require an antiarrhythmic drug and was intolerant to beta-blockers Ongoing symptoms of shortness of breath and chest discomfort part of long COVID she has had extensive evaluation most recent echocardiogram structurally normal heart previous cardiac CTA normal and after discussion we will undergo cardiac MR to define if she had cardiac injury.   Next appointment: 6 months   Medication Adjustments/Labs and Tests Ordered: Current medicines are reviewed at length with the patient today.  Concerns regarding medicines are outlined above.  Orders Placed This Encounter  Procedures   MR CARDIAC MORPHOLOGY W WO CONTRAST   No orders of the defined types were placed in this encounter.  Chief complaint follow-up with frequent PVCs and long COVID symptoms   History of Present Illness:    Morgan Long is a 51 y.o. female with a hx of symptomatic PVCs intolerant of beta-blockers in the setting of postacute COVID-19 infection and ongoing shortness of breath.  She has had echocardiograms most recently [facility] 02/23/2020 showing structurally normal heart.  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  She was last seen 09/13/2020.  Compliance with diet, lifestyle and medications: Yes She has had a more recent echocardiogram structurally normal heart 01/06/2022  1. Left ventricular ejection fraction, by  estimation, is 60 to 65%. The  left ventricle has normal function. The left ventricle has no regional  wall motion abnormalities. Left ventricular diastolic parameters were  normal.   2. Right ventricular systolic function is normal. The right ventricular  size is normal. There is normal pulmonary artery systolic pressure.   3. The mitral valve is normal in structure. Trivial mitral valve  regurgitation. No evidence of mitral stenosis.   4. The aortic valve is normal in structure. Aortic valve regurgitation is  not visualized. Mild aortic valve sclerosis is present, with no evidence  of aortic valve stenosis.   5. The inferior vena cava is normal in size with greater than 50%  respiratory variability, suggesting right atrial pressure of 3 mmHg.   She had normal cardiac CTA calcium score of 0 09/21/2020  She is improved but not back to normal she has palpitation not severe or sustained but she continues to have episodes of exertional shortness of breath and chest tightness stable pattern relieved with rest. We discussed long COVID symptoms and she would like further evaluation I think a cardiac MRI would answer the question of whether she had a cardiac injury as part of her severe COVID infection. Past Medical History:  Diagnosis Date   Allergic rhinitis    Asthma    B12 deficiency    Chronic migraine    Dr. Catalina Gravel   Constipation    Dry eye syndrome of both lacrimal glands    Dry skin    Endometriosis    Fatigue    Gastroparesis    GERD (  gastroesophageal reflux disease)    Headache(784.0)    occasional, dx w/ Migraines before, Topamax helps   Heartburn    Hepatic steatosis    History of stomach ulcers    Lactose intolerance    Localized edema    Migraine    Muscle weakness (generalized)    Nuclear cataract of both eyes    Mild   Overweight    Pain in right ankle and joints of right foot    Prediabetes    Shortness of breath    Sinus complaint    Sinusitis    Tired     TMJ pain dysfunction syndrome    occasional   Vitamin D deficiency     Past Surgical History:  Procedure Laterality Date   ANKLE SURGERY Left 11/16/2017   AUGMENTATION MAMMAPLASTY Bilateral 2006   BREAST ENHANCEMENT SURGERY  2006   BUNIONECTOMY     COLONOSCOPY     endrometroisis     fallopian tube removed     Left   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   cetirizine (ZYRTEC) 10 MG tablet TAKE 1 TABLET BY MOUTH DAILY   clindamycin (CLEOCIN T) 1 % external solution Apply topically 2 (two) times daily.   cyanocobalamin (,VITAMIN B-12,) 1000 MCG/ML injection Inject 1,000 mcg into the muscle every 30 (thirty) days.   ESTROGEL 0.75 MG/1.25 GM (0.06%) topical gel Apply 1 application topically daily.   etonogestrel-ethinyl estradiol (NUVARING) 0.12-0.015 MG/24HR vaginal ring Place 1 each vaginally every 28 (twenty-eight) days. Insert vaginally and leave in place for 3 consecutive weeks, then remove for 1 week.   linaclotide (LINZESS) 145 MCG CAPS capsule Take 1 capsule by mouth daily as needed for constipation.   linaclotide (LINZESS) 72 MCG capsule Take 1 capsule (72 mcg total) by mouth daily before breakfast.   liraglutide (VICTOZA) 18 MG/3ML SOPN Inject 0.6 mg into the skin daily.   Multiple Vitamin (MULTIVITAMIN) LIQD Take 5 mLs by mouth daily.   PREVIDENT 5000 SENSITIVE 1.1-5 % PSTE USE BEFORE BED DO NOT RINSE   promethazine-dextromethorphan (PROMETHAZINE-DM) 6.25-15 MG/5ML syrup Take 5 mLs by mouth 4 (four) times daily as needed.   triamcinolone cream (KENALOG) 0.1 % APPLY TO AFFECTED AREA TWICE A DAY   [name] 50 MG TABS 1 PILL AT ONSET OF MIGRAINE, MAY REPEAT IN 2 HOURS, NO MORE  THAN 2 PILLS IN 24 HOURS.).   Vitamin D, Ergocalciferol, (DRISDOL) 1.25 MG (50000 UNIT) CAPS capsule Take 1 capsule (50,000 Units total) by mouth every 7 (seven) days.     Allergies:   Acetaminophen-codeine, Hydrocodone, Oxycodone, Tyloxapol, and Codeine   Social History   Socioeconomic History   Marital status: Single    Spouse name: Not on file   Number of children: 0   Years of education: BS   Highest education level: Not on file  Occupational History   Occupation: Paramedic, going to school    Employer: [employer]   Occupation: Air traffic controller  Tobacco Use   Smoking status: Never    Passive exposure: Never   Smokeless tobacco: Never  Vaping Use   Vaping Use: Never used  Substance and Sexual Activity   Alcohol use: Not Currently    Comment: socially - occasional    Drug use: No   Sexual activity: Yes    Birth control/protection: Inserts    Comment: nuvaring  Other Topics Concern   Not on file  Social History Narrative   Household:sister and her 3 kids    Drinks occasional starbucks drink       Social Determinants of Health   Financial Resource Strain: Not on file  Food Insecurity: Not on file  Transportation Needs: Not on file  Physical Activity: Not on file  Stress: Not on file  Social Connections: Not on file     Family History: The patient's family history includes Alcoholism in her mother; Asthma in an other family member; Breast cancer in her maternal aunt, maternal aunt, maternal aunt, and maternal aunt; Cancer in her father and mother; Cancer - Other (age of onset: 37) in her mother; Colon cancer in her paternal grandfather; Colon cancer (age of onset: 21) in her maternal uncle; Diabetes in an other family member; Heart disease in her father; Hypertension in her maternal aunt; Pancreatic cancer in her maternal aunt; Stroke in her father; Throat cancer in her maternal uncle and mother. There is no history of Heart attack, Rectal cancer, or  Stomach cancer. ROS:   Please see the history of present illness.    All other systems reviewed and are negative.  EKGs/Labs/Other Studies Reviewed:    The following studies were reviewed today:    Recent Labs: 02/02/2021: TSH 1.590 02/21/2021: ALT 16; BUN 22; Creatinine, Ser 0.85; Hemoglobin 12.2; Platelets 355.0; Potassium 4.4; Sodium 138  Recent Lipid Panel    Component Value Date/Time   CHOL 201 (H) 02/02/2021 0938   TRIG 91 02/02/2021 0938   HDL 71 02/02/2021 0938   CHOLHDL 3 05/14/2014 1223   VLDL 12.2 05/14/2014 1223   LDLCALC 114 (H) 02/02/2021 0938    Physical Exam:    VS:  BP 115/70 (BP Location: Right Arm)    Pulse 80    Ht 5\' 2"  (1.575 m)    Wt 200 lb (90.7 kg)    SpO2 98%    BMI 36.58 kg/m     Wt Readings from Last 3 Encounters:  06/28/21 200 lb (90.7 kg)  06/14/21 196 lb (88.9 kg)  05/25/21 206 lb 6.4 oz (93.6 kg)     GEN:  Well nourished, well developed in no acute distress HEENT: Normal NECK: No JVD; No carotid bruits LYMPHATICS: No lymphadenopathy CARDIAC: RRR, no murmurs, rubs, gallops RESPIRATORY:  Clear to auscultation without rales, wheezing or rhonchi  ABDOMEN: Soft, non-tender, non-distended MUSCULOSKELETAL:  No edema; No deformity  SKIN: Warm and dry NEUROLOGIC:  Alert and oriented x 3 PSYCHIATRIC:  Normal affect    Signed, [name] More, MD  06/28/2021 1:38 PM    [facility]

## 2021-06-28 NOTE — Telephone Encounter (Signed)
Dr.Beasley 

## 2021-06-28 NOTE — Progress Notes (Signed)
Rx sent 

## 2021-06-28 NOTE — Progress Notes (Signed)
Ok x 60days

## 2021-06-28 NOTE — Progress Notes (Signed)
Called pharmacy and it was confirmed that insurance is asking for 60day.

## 2021-06-28 NOTE — Patient Instructions (Signed)
Medication Instructions:  Your physician recommends that you continue on your current medications as directed. Please refer to the Current Medication list given to you today.  Please take an over the counter magnesium supplement.  *If you need a refill on your cardiac medications before your next appointment, please call your pharmacy*   Lab Work: Your physician recommends that you return for lab work in: Within one week of your cardiac  MRI.  BMP, CBC If you have labs (blood work) drawn today and your tests are completely normal, you will receive your results only by: [place] (if you have MyChart) OR A paper copy in the mail If you have any lab test that is abnormal or we need to change your treatment, we will call you to review the results.   Testing/Procedures: Magnetic resonance imaging (MRI) is a painless test that produces images of the inside of the body without using X-rays. During an MRI, strong magnets and radio waves work together in a [employer], [employer] to form detailed images. MRI images may provide more details about a medical condition than X-rays, CT scans, and ultrasounds can provide.   You may be given earphones to listen for instructions.   You may eat a light breakfast and take medications as ordered.  If a contrast material will be used, an IV will be inserted into one of your veins. Contrast material will be injected into your IV.   You will be asked to remove all metal, including: Watch, jewelry, and other metal objects including hearing aids, hair pieces and dentures. (Braces and fillings normally are not a problem.)   If contrast material was used:  It will leave your body through your urine within a day. You may be told to drink plenty of fluids to help flush the contrast material out of your system.  TEST WILL TAKE APPROXIMATELY 1 HOUR  PLEASE NOTIFY SCHEDULING AT LEAST 24 HOURS IN ADVANCE IF YOU ARE UNABLE TO KEEP YOUR APPOINTMENT.      Follow-Up: At  [facility] you and your health needs are our priority.  As part of our continuing mission to provide you with exceptional heart care, we have created designated Provider Care Teams.  These Care Teams include your primary Cardiologist (physician) and Advanced Practice Providers (APPs -  Physician Assistants and Nurse Practitioners) who all work together to provide you with the care you need, when you need it.  We recommend signing up for the patient portal called "MyChart".  Sign up information is provided on this After Visit Summary.  MyChart is used to connect with patients for Virtual Visits (Telemedicine).  Patients are able to view lab/test results, encounter notes, upcoming appointments, etc.  Non-urgent messages can be sent to your provider as well.   To learn more about what you can do with MyChart, go to [email or web address].    Your next appointment:   6 month(s)  The format for your next appointment:   In Person  Provider:   [name] More, MD    Other Instructions

## 2021-06-28 NOTE — Telephone Encounter (Signed)
Patient called to say that she is claustrophobic, and would need a medication for her MRI that she has coming up. Please advise

## 2021-06-29 NOTE — Telephone Encounter (Signed)
Received approval from [place] . Reference #[id number] 06/28/2021-06/27/2022.

## 2021-06-30 DIAGNOSIS — Z9889 Other specified postprocedural states: Secondary | ICD-10-CM | POA: Diagnosis not present

## 2021-06-30 DIAGNOSIS — D134 Benign neoplasm of liver: Secondary | ICD-10-CM | POA: Diagnosis not present

## 2021-06-30 NOTE — Telephone Encounter (Signed)
Left message on patients voicemail to please return our call.   

## 2021-06-30 NOTE — Telephone Encounter (Signed)
Patient returning call.

## 2021-07-04 DIAGNOSIS — D134 Benign neoplasm of liver: Secondary | ICD-10-CM | POA: Diagnosis not present

## 2021-07-05 NOTE — Telephone Encounter (Signed)
Spoke to the patient just now and let her know Dr. Joya Gaskins recommendations. She verbalizes understanding and states she will reach out to her PCP regarding this.    Encouraged patient to call back with any questions or concerns.

## 2021-07-07 ENCOUNTER — Encounter (INDEPENDENT_AMBULATORY_CARE_PROVIDER_SITE_OTHER): Payer: Self-pay | Admitting: Family Medicine

## 2021-07-07 ENCOUNTER — Ambulatory Visit (INDEPENDENT_AMBULATORY_CARE_PROVIDER_SITE_OTHER): Payer: 59 | Admitting: Family Medicine

## 2021-07-07 ENCOUNTER — Other Ambulatory Visit: Payer: Self-pay

## 2021-07-07 VITALS — BP 108/67 | HR 86 | Temp 98.6°F | Ht 62.0 in | Wt 193.0 lb

## 2021-07-07 DIAGNOSIS — E538 Deficiency of other specified B group vitamins: Secondary | ICD-10-CM

## 2021-07-07 DIAGNOSIS — E559 Vitamin D deficiency, unspecified: Secondary | ICD-10-CM | POA: Diagnosis not present

## 2021-07-07 DIAGNOSIS — R7303 Prediabetes: Secondary | ICD-10-CM | POA: Diagnosis not present

## 2021-07-07 DIAGNOSIS — Z9189 Other specified personal risk factors, not elsewhere classified: Secondary | ICD-10-CM

## 2021-07-07 DIAGNOSIS — E785 Hyperlipidemia, unspecified: Secondary | ICD-10-CM

## 2021-07-07 DIAGNOSIS — E1169 Type 2 diabetes mellitus with other specified complication: Secondary | ICD-10-CM

## 2021-07-07 DIAGNOSIS — E669 Obesity, unspecified: Secondary | ICD-10-CM | POA: Diagnosis not present

## 2021-07-07 DIAGNOSIS — Z6835 Body mass index (BMI) 35.0-35.9, adult: Secondary | ICD-10-CM

## 2021-07-07 MED ORDER — VITAMIN D (ERGOCALCIFEROL) 1.25 MG (50000 UNIT) PO CAPS: 50000.0000 [IU] | ORAL_CAPSULE | ORAL | 0 refills | Status: DC

## 2021-07-08 DIAGNOSIS — N393 Stress incontinence (female) (male): Secondary | ICD-10-CM | POA: Diagnosis not present

## 2021-07-08 LAB — COMPREHENSIVE METABOLIC PANEL
ALT: 18 IU/L (ref 0–32)
AST: 18 IU/L (ref 0–40)
Albumin/Globulin Ratio: 1.6 (ref 1.2–2.2)
Albumin: 4.3 g/dL (ref 3.8–4.9)
Alkaline Phosphatase: 83 IU/L (ref 44–121)
BUN/Creatinine Ratio: 16 (ref 9–23)
BUN: 13 mg/dL (ref 6–24)
Bilirubin Total: 0.2 mg/dL (ref 0.0–1.2)
CO2: 22 mmol/L (ref 20–29)
Calcium: 8.9 mg/dL (ref 8.7–10.2)
Chloride: 104 mmol/L (ref 96–106)
Creatinine, Ser: 0.83 mg/dL (ref 0.57–1.00)
Globulin, Total: 2.7 g/dL (ref 1.5–4.5)
Glucose: 87 mg/dL (ref 70–99)
Potassium: 4.5 mmol/L (ref 3.5–5.2)
Sodium: 139 mmol/L (ref 134–144)
Total Protein: 7 g/dL (ref 6.0–8.5)
eGFR: 85 mL/min/{1.73_m2} (ref >59)

## 2021-07-08 LAB — LIPID PANEL
Chol/HDL Ratio: 2.7 ratio (ref 0.0–4.4)
Cholesterol, Total: 195 mg/dL (ref 100–199)
HDL: 71 mg/dL (ref >39)
LDL Chol Calc (NIH): 109 mg/dL — ABNORMAL HIGH (ref 0–99)
Triglycerides: 81 mg/dL (ref 0–149)
VLDL Cholesterol Cal: 15 mg/dL (ref 5–40)

## 2021-07-08 LAB — VITAMIN D 25 HYDROXY (VIT D DEFICIENCY, FRACTURES): Vit D, 25-Hydroxy: 60.7 ng/mL (ref 30.0–100.0)

## 2021-07-08 LAB — INSULIN, RANDOM: INSULIN: 14.2 u[IU]/mL (ref 2.6–24.9)

## 2021-07-08 LAB — HEMOGLOBIN A1C
Est. average glucose Bld gHb Est-mCnc: 123 mg/dL
Hgb A1c MFr Bld: 5.9 % — ABNORMAL HIGH (ref 4.8–5.6)

## 2021-07-08 LAB — VITAMIN B12: Vitamin B-12: 734 pg/mL (ref 232–1245)

## 2021-07-11 NOTE — Progress Notes (Signed)
Chief Complaint:   OBESITY Morgan Long is here to discuss her progress with her obesity treatment plan along with follow-up of her obesity related diagnoses. Morgan Long is on the Category 2 Plan and states she is following her eating plan approximately 90% of the time. Morgan Long states she is walking 60 minutes 3-4 times per week.  Today's visit was #: 9 Starting weight: 206 lbs Starting date: 02/02/2021 Today's weight: 193 lbs Today's date: 07/07/2021 Total lbs lost to date: 13 Total lbs lost since last in-office visit: 3  Interim History:   Morgan Long is a pt of Dr. Migdalia Dk. This is her first OV with me.   She usually skips breakfast and forgets to eat those foods later int he day. Pt denies hunger or issues with meal plan. She states she's here for fasting blood work.   Subjective:   1. Prediabetes New medical insurance won't cover Ozempic, only Victoza. Dr. Leafy Ro changed meds last OV and pt is still finishing the old Rx. She is still on 0.5 mg Ozempic currently still  2. Vitamin D deficiency Morgan Long is tolerating medication(s) well without side effects.  Medication compliance is good and patient appears to be taking it as prescribed.  The patient denies additional concerns regarding this condition.   3. Vitamin B12 deficiency Pt has been on monthly B12 injections for 4 months now thorough PCP. B12 level hasn't been checked since.  4. Hyperlipidemia associated with type 2 diabetes mellitus (HCC) Elevated LDL and HDL within normal limits at 71. Medication: None  5. At risk for malnutrition Morgan Long is at risk for malnutrition due to skipping meals and inadequate intake.  Assessment/Plan:   Orders Placed This Encounter  Procedures   Vitamin B12   VITAMIN D 25 Hydroxy (Vit-D Deficiency, Fractures)   Lipid panel   Hemoglobin A1c   Insulin, random   Comprehensive metabolic panel    Medications Discontinued During This Encounter  Medication Reason   Vitamin D, Ergocalciferol, (DRISDOL) 1.25  MG (50000 UNIT) CAPS capsule Reorder     Meds ordered this encounter  Medications   Vitamin D, Ergocalciferol, (DRISDOL) 1.25 MG (50000 UNIT) CAPS capsule    Sig: Take 1 capsule (50,000 Units total) by mouth every 7 (seven) days.    Dispense:  4 capsule    Refill:  0     1. Prediabetes Pt denies hunger and cravings on the meds. No change in treatment plan. Check labs today. - Stressed importance of dietary and lifestyle modifications to result in weight loss as first line txmnt - continue to decrease simple carbs; increase fiber and proteins -> follow meal plan  - handouts provided at pt's request after education provided. Pt's concerns/questions addressed.   - anticipatory guidance given.    - Hemoglobin A1c - Insulin, random   2. Vitamin D deficiency - Reiterated importance of vitamin D (as well as calcium) to their health and wellbeing.  - Reminded Morgan Long that weight loss will likely improve availability of vitamin D, thus encouraged her to continue with meal plan and their weight loss efforts to further improve this condition. - I recommend patient continue to take weekly prescription vit D 50,000 IU - Informed patient this may be a lifelong thing, and she was encouraged to continue to take the medicine until told otherwise.   - we will need to monitor levels regularly (every 3-4 mo on average) to keep levels within normal limits.  - weight loss will likely improve availability of  vitamin D, thus encouraged Morgan Long to continue with meal plan and their weight loss efforts to further improve this condition - pt's questions and concerns regarding this condition addressed.  Refill- Vitamin D, Ergocalciferol, (DRISDOL) 1.25 MG (50000 UNIT) CAPS capsule; Take 1 capsule (50,000 Units total) by mouth every 7 (seven) days.  Dispense: 4 capsule; Refill: 0  - VITAMIN D 25 Hydroxy (Vit-D Deficiency, Fractures)   3. Vitamin B12 deficiency Continue current treatment plan. Check labs  today.   Counseling provided today, see below.  We will continue to monitor.  Your Vitamin B12 level is low. This vitamin is very important for the proper functioning of nerves. A vitamin B12 deficiency can cause anemia and neurologic symptoms such as tingling and pain in the hands and feet and possible cognitive problems.  Vitamin B12 is naturally present in foods of animal origin, including fish, meat, poultry, eggs, and dairy products See if you can increase your intake but also take [phone] mcg per day of a supplement which you can buy over the counter.   Below is a web site where you can get some additional information. If your level does not increase with over the counter treatment, you may need injections.   http://ods.[employer].co.za   - Vitamin B12   4. Hyperlipidemia associated with type 2 diabetes mellitus ([place]) Continue prudent nutritional plan and increase exercise. Check labs today.  - Lipid panel - Comprehensive metabolic panel   5. At risk for malnutrition Morgan Long was given extensive malnutrition prevention education and counseling today of more than 9 minutes.  Counseled her that malnutrition refers to inappropriate nutrients or not the right balance of nutrients for optimal health.  Discussed with Morgan Long that it is absolutely possible to be malnourished but yet obese.  Risk factors, including but not limited to, inappropriate dietary choices, difficulty with obtaining food due to physical or financial limitations, and various physical and mental health conditions were reviewed with Morgan Long.    6. Obesity with current BMI of 35.3 Morgan Long is currently in the action stage of change. As such, her goal is to continue with weight loss efforts. She has agreed to the Category 2 Plan and keeping a food journal and adhering to recommended goals of 200-300 calories and 20 gr protein at breakfast.   Breakfast options discussed with pt. Increase  proteins and don't skip meals.  Exercise goals:  As is  Behavioral modification strategies: planning for success.  Ciria has agreed to follow-up with our clinic in 2-3 weeks. She was informed of the importance of frequent follow-up visits to maximize her success with intensive lifestyle modifications for her multiple health conditions.   Morgan Long was informed we would discuss her lab results at her next visit unless there is a critical issue that needs to be addressed sooner. Morgan Long agreed to keep her next visit at the agreed upon time to discuss these results.  Objective:   Blood pressure 108/67, pulse 86, temperature 98.6 F (37 C), height 5\' 2"  (1.575 m), weight 193 lb (87.5 kg), last menstrual period 06/30/2021, SpO2 98 %. Body mass index is 35.3 kg/m.  General: Cooperative, alert, well developed, in no acute distress. HEENT: Conjunctivae and lids unremarkable. Cardiovascular: Regular rhythm.  Lungs: Normal work of breathing. Neurologic: No focal deficits.   Lab Results  Component Value Date   CREATININE 0.83 07/07/2021   BUN 13 07/07/2021   NA 139 07/07/2021   K 4.5 07/07/2021   CL 104 07/07/2021  CO2 22 07/07/2021   Lab Results  Component Value Date   ALT 18 07/07/2021   AST 18 07/07/2021   ALKPHOS 83 07/07/2021   BILITOT 0.2 07/07/2021   Lab Results  Component Value Date   HGBA1C 5.9 (H) 07/07/2021   HGBA1C 5.9 (H) 02/02/2021   HGBA1C 5.9 (H) 05/06/2018   HGBA1C 5.8 (H) 01/23/2018   HGBA1C 5.5 08/14/2017   Lab Results  Component Value Date   INSULIN 14.2 07/07/2021   INSULIN 12.8 02/02/2021   INSULIN 11.2 05/06/2018   INSULIN 8.4 01/23/2018   INSULIN 8.0 08/14/2017   Lab Results  Component Value Date   TSH 1.590 02/02/2021   Lab Results  Component Value Date   CHOL 195 07/07/2021   HDL 71 07/07/2021   LDLCALC 109 (H) 07/07/2021   TRIG 81 07/07/2021   CHOLHDL 2.7 07/07/2021   Lab Results  Component Value Date   VD25OH 60.7 07/07/2021   VD25OH 44.8  02/02/2021   VD25OH 35.92 11/02/2020   Lab Results  Component Value Date   WBC 7.1 02/21/2021   HGB 12.2 02/21/2021   HCT 37.6 02/21/2021   MCV 91.0 02/21/2021   PLT 355.0 02/21/2021   Lab Results  Component Value Date   FERRITIN 169 05/21/2019   Attestation Statements:   Reviewed by clinician on day of visit: allergies, medications, problem list, medical history, surgical history, family history, social history, and previous encounter notes.  Coral Ceo, CMA, am acting as transcriptionist for [employer], DO.  I have reviewed the above documentation for accuracy and completeness, and I agree with the above. Marjory Sneddon, D.O.  The [place] was signed into law in 2016 which includes the topic of electronic health records.  This provides immediate access to information in MyChart.  This includes consultation notes, operative notes, office notes, lab results and pathology reports.  If you have any questions about what you read please let us know at your next visit so we can discuss your concerns and take corrective action if need be.  We are right here with you.

## 2021-07-12 ENCOUNTER — Other Ambulatory Visit: Payer: Self-pay

## 2021-07-12 ENCOUNTER — Ambulatory Visit: Payer: BC Managed Care – PPO | Admitting: Plastic Surgery

## 2021-07-12 ENCOUNTER — Other Ambulatory Visit (HOSPITAL_COMMUNITY)
Admission: RE | Admit: 2021-07-12 | Discharge: 2021-07-12 | Disposition: A | Discharge status: Home / Self Care | Payer: 59 | Source: Ambulatory Visit | Attending: Plastic Surgery | Admitting: Plastic Surgery

## 2021-07-12 ENCOUNTER — Encounter: Payer: Self-pay | Admitting: Plastic Surgery

## 2021-07-12 VITALS — BP 111/69 | HR 82

## 2021-07-12 DIAGNOSIS — L989 Disorder of the skin and subcutaneous tissue, unspecified: Secondary | ICD-10-CM | POA: Diagnosis not present

## 2021-07-12 NOTE — Progress Notes (Signed)
Procedure Note  Preoperative Dx: changing skin lesion of right cheek  Postoperative Dx: Same  Procedure: excision of changing skin lesion of right cheek 3 mm  Anesthesia: Lidocaine 1% with 1:100,000 epinephrine  Description of Procedure: Risks and complications were explained to the patient.  Consent was confirmed and the patient understands the risks and benefits.  The potential complications and alternatives were explained and the patient consents.  The patient expressed understanding the option of not having the procedure and the risks of a scar.  Time out was called and all information was confirmed to be correct.    The area was prepped and drapped.  Lidocaine 1% with epinepherine was injected in the subcutaneous area.  After waiting several minutes for the local to take affect a circular blade was used to excise the area.  The skin edges were reapproximated with 5-0 Monocryl subcuticular running closure.  A dressing was applied.  The patient was given instructions on how to care for the area and a follow up appointment.  Morgan Long tolerated the procedure well and there were no complications. The specimen was sent to pathology.

## 2021-07-12 NOTE — Addendum Note (Signed)
Addended by: Wallace Going on: 07/12/2021 04:43 PM   Modules accepted: Orders

## 2021-07-13 ENCOUNTER — Ambulatory Visit: Payer: 59 | Admitting: Neurology

## 2021-07-13 VITALS — BP 108/77 | HR 93 | Ht 63.0 in | Wt 199.8 lb

## 2021-07-13 DIAGNOSIS — G43719 Chronic migraine without aura, intractable, without status migrainosus: Secondary | ICD-10-CM | POA: Diagnosis not present

## 2021-07-13 IMAGING — MG DIGITAL SCREENING BREAST BILAT IMPLANT W/ TOMO W/ CAD
8 of 12 series · 8 of 28 positions shown · non-contrast
Comparison: Previous exams.

CLINICAL DATA: Screening.

EXAM:
DIGITAL SCREENING BILATERAL MAMMOGRAM WITH IMPLANTS, CAD AND
TOMOSYNTHESIS
TECHNIQUE: Bilateral screening digital craniocaudal and mediolateral oblique
mammograms were obtained. Bilateral screening digital breast
tomosynthesis was performed. The images were evaluated with
computer-aided detection. Standard and/or implant displaced views
were performed.

[L MLO]
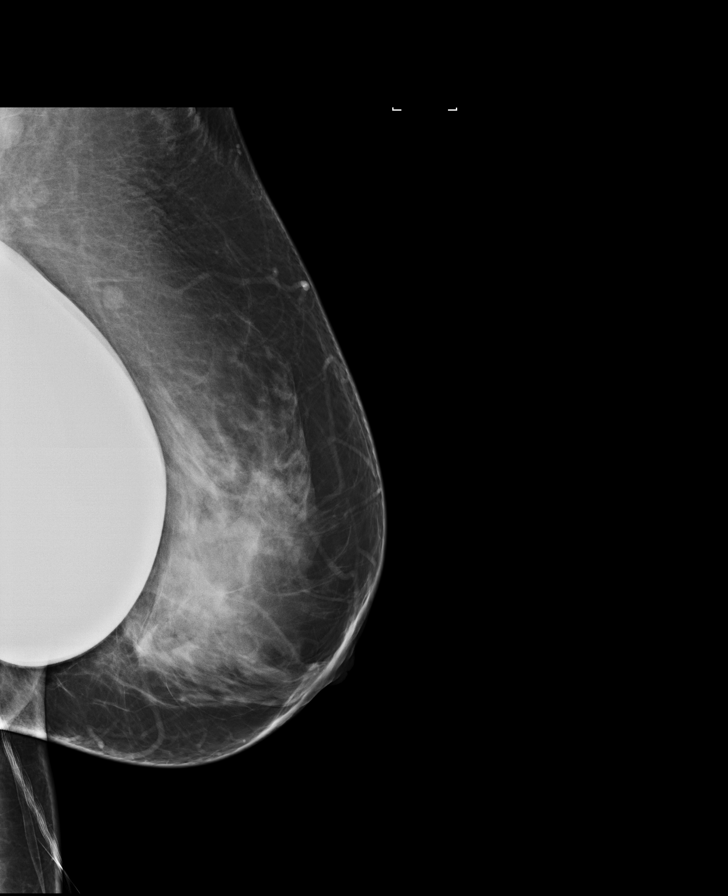

[R MLO]
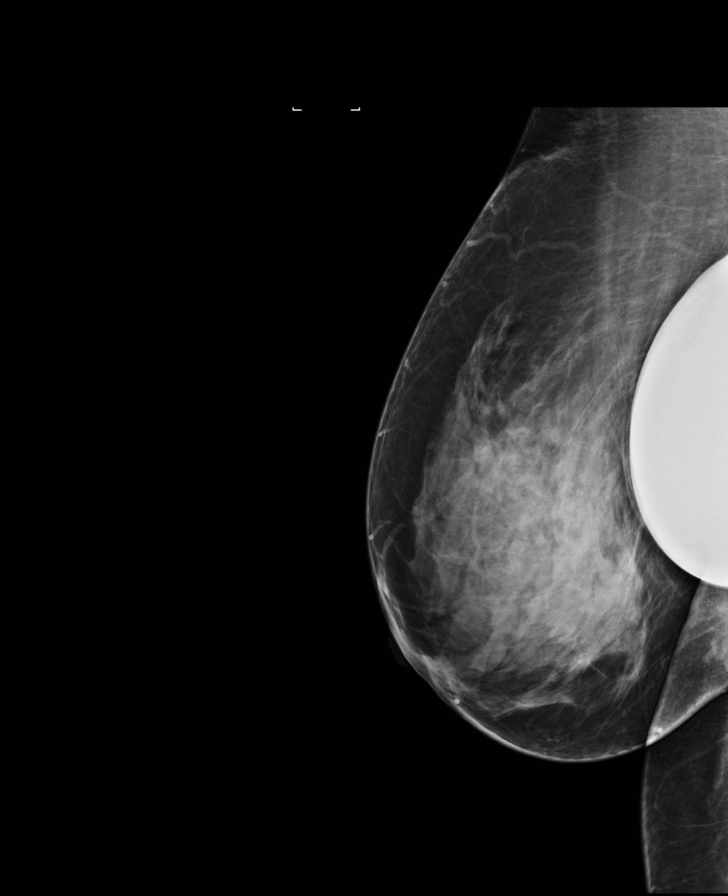

[R CC]
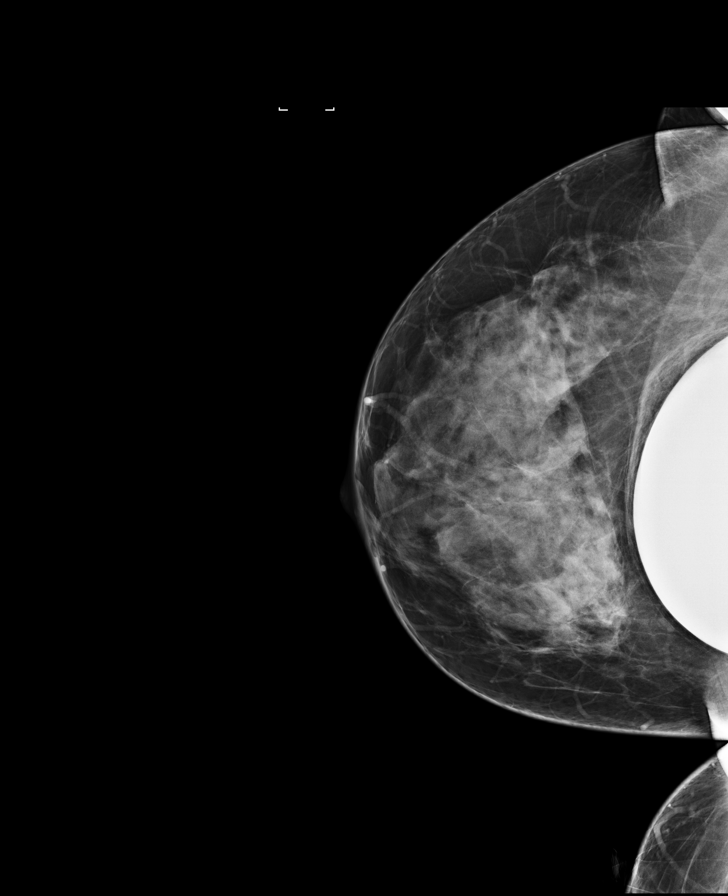

[L CC]
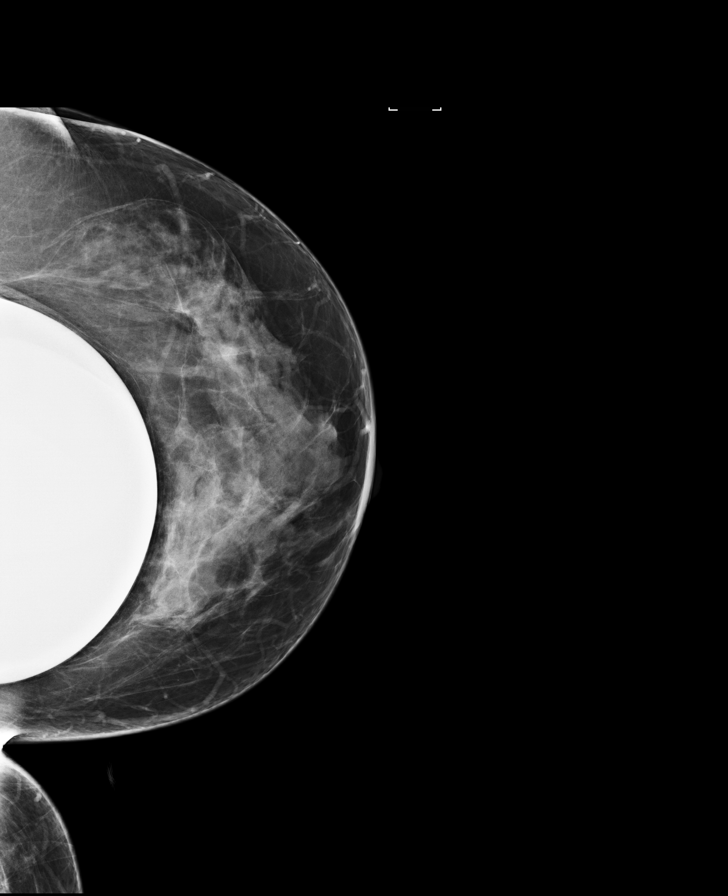

[R MLO synth-2D]
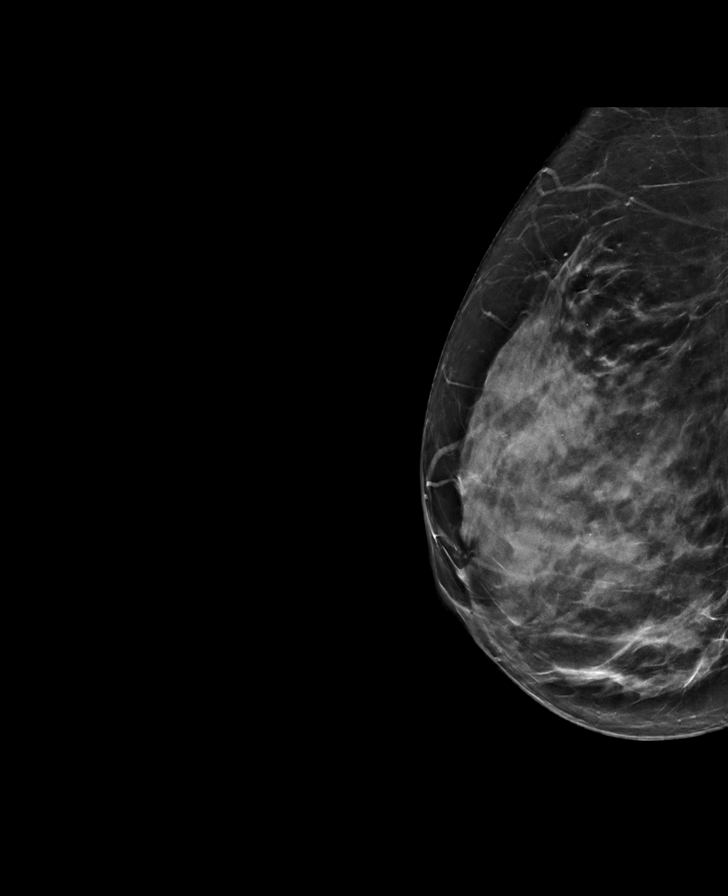

[L CC synth-2D]
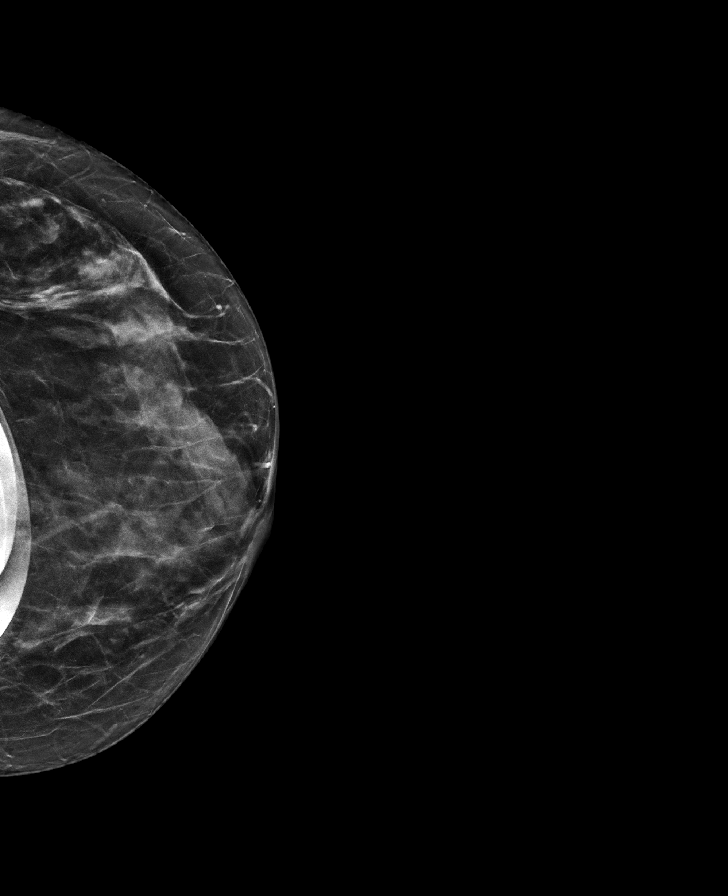

[R CC synth-2D]
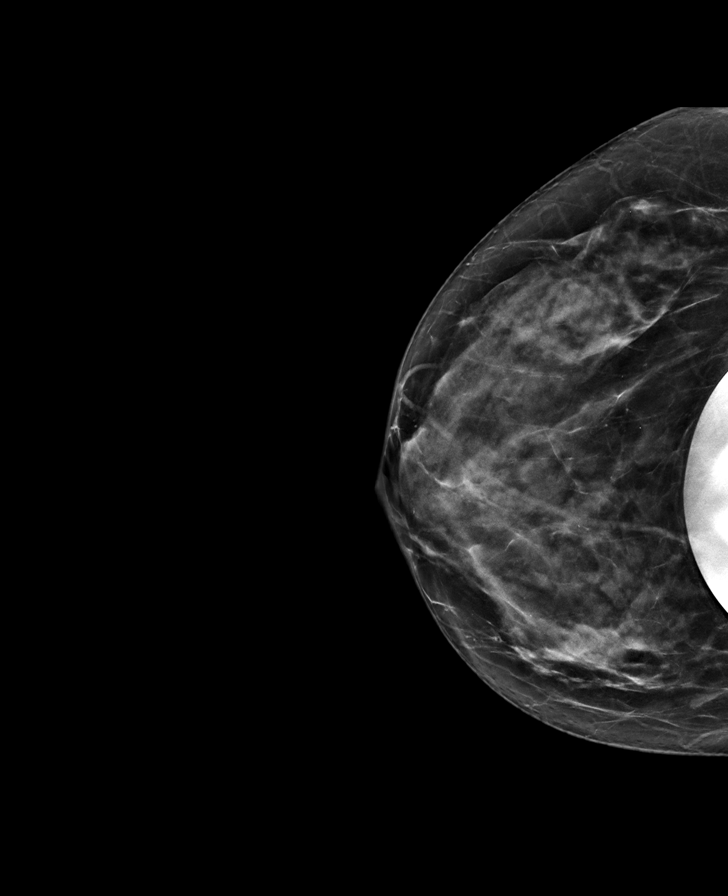

[L MLO synth-2D]
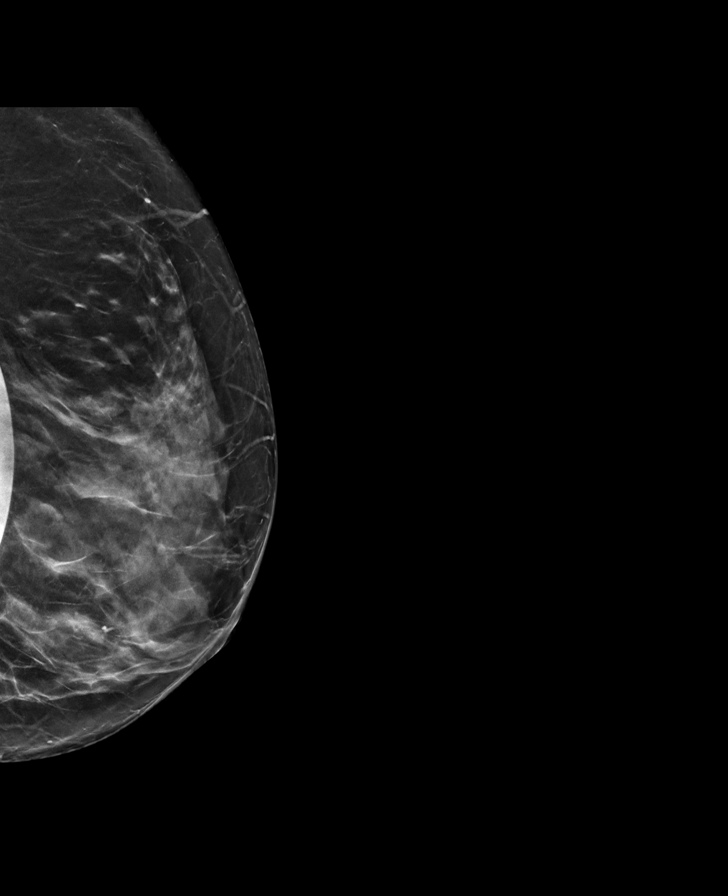

[8 of 28 positions shown; findings below may reference images not displayed]

ACR Breast Density Category d: The breast tissue is extremely dense,
which lowers the sensitivity of mammography.
FINDINGS: The patient has bilateral retropectoral silicone implants. In the
right breast, a possible asymmetry warrants further evaluation. In
the left breast, no suspicious masses or malignant type
calcifications are identified.
IMPRESSION: Further evaluation is suggested for possible asymmetry in the right
breast.

RECOMMENDATION:
Diagnostic mammogram and possibly ultrasound of the right breast.
(Code:BY-T-ZZW)

The patient will be contacted regarding the findings, and additional
imaging will be scheduled.

BI-RADS CATEGORY  0: Incomplete. Need additional imaging evaluation
and/or prior mammograms for comparison.

## 2021-07-13 MED ORDER — ONABOTULINUMTOXINA 100 UNITS IJ SOLR
200.0000 [IU] | Freq: Once | INTRAMUSCULAR | Status: AC
  Administered 2021-07-13: 200 [IU] via INTRAMUSCULAR

## 2021-07-13 NOTE — Progress Notes (Signed)
Subjective:    Long ID: Morgan Long is a 51 y.o. female.  HPI  Morgan Long is a 51 year old right-handed woman with an underlying medical history of allergic rhinitis, endometriosis, TMJ problems, Reflux disease, gastroparesis, hepatic congestion with steatosis, s/p partial hepatectomy for hepatic tumor, lactose intolerance, neck pain, and obesity, who presents for her 15th Botox injection for her intractable migraines.  Morgan Long is unaccompanied today. She had her 14th injection on 04/13/21, at which time she reported doing well.   Today, 07/13/2021: She reports having done well, as far as Morgan injections. She continues to benefit from Morgan injections.  She has noted no side effects.    Written informed consent for recurrent, 3 monthly intramuscular injections with botulinum toxin for this indication has previously been obtained and scanned into Morgan Long's electronic chart. I will re-consent if Morgan type of botulinum toxin used or Morgan indication for injection changes for this Long in Morgan future. Morgan Long is informed that we will use Morgan same consent for her recurrent, most likely 3 monthly injections. She demonstrated understanding and voiced agreement.   I have previously talked to Morgan Long in detail about expectations, limitations, benefits as well as potential adverse effects of botulinum toxin injections. Morgan Long understands that Morgan side effects include (but are not limited to): Mouth dryness, dryness of eyes, speech and swallowing difficulties, respiratory depression or problems breathing, weakness of muscles including more distant muscles than Morgan ones injected, flu-like symptoms, myalgias, injection site reactions such as redness, itching, swelling, pain, and infection.   200 units of botulinum toxin type A were reconstituted using preservative-free normal saline to a concentration of 10 units per 0.1 mL and drawn up into 1 mL tuberculin syringes. Botox- 200 units x 1  vial, Lot: [id number], Expiration: 02/2024, NDC: 3790-2409-73, 0.9% Sodium Chloride- 2.2 mL total, Lot: [id number], Expiration: 07/07/2022, NDC: 4268-3419-62    O/E: BP 108/77    Pulse 93    Ht 5' 3" (1.6 m)    Wt 199 lb 12.8 oz (90.6 kg)    LMP 06/30/2021    BMI 35.39 kg/m      Morgan Long was situated in a chair, sitting comfortably. After preparing Morgan areas with 70% isopropyl alcohol and using a 26 gauge 1 1/2 inch hollow lumen recording EMG needle for Morgan neck injections as well as a 30 gauge 1 inch needle for Morgan facial injections, a total dose of 155 units of botulinum toxin type A in Morgan form of Botox was injected into Morgan muscles and Morgan following distribution and quantities:   #1: 10 units on Morgan right and 10 units in Morgan left frontalis muscles, broken down in 2 sites on each side. #2: 5 units in Morgan right and 5 units in Morgan left corrugator muscles. #3: 15 units in Morgan right and 15 units in Morgan left occipitalis muscles, broken down in 3 sites on each side. #4: 20 units in Morgan right and 20 units in Morgan left temporalis muscles, broken down in 4 sites on each side. #5: 15 units on Morgan right and 15 units in Morgan left upper trapezius muscles, broken down in 3 sites on each side. #6: 10 units in Morgan right and 10 units in Morgan left splenius capitis muscles, broken down in 2 sites on each side. #7: 2.5 units in Morgan right and 2.5 units in Morgan left procerus muscles.   EMG guidance was utilized for Morgan neck injections with mild EMG activity noted, especially  in Morgan splenius capitis muscles bilaterally, and slight in both upper traps.   A dose of 45 units out of a total dose of 200 units was discarded as unavoidable waste.   Morgan Long tolerated Morgan procedure well without immediate complications. She was advised to make a followup appointment for repeat injections in 3 months from now and encouraged to call us with any interim questions, concerns, problems, or updates. She was in agreement and did not  have any questions prior to leaving clinic today.   Previously (copied from previous notes for reference):   She had her 13th injection on 01/11/2021, at which time she reported doing well.  She had experienced no side effects.   She called in Morgan interim reporting breakthrough migraines.  Her primary care ordered a brain MRI.  She had a brain MRI without contrast on 03/12/2021 and I reviewed Morgan results: IMPRESSION: No evidence of acute intracranial abnormality.   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   Otherwise unremarkable non-contrast MRI appearance of Morgan brain.   She had her 12th injection on 10/11/2020, at which time she reported doing well, latest injection had worked well.   She had her 10th injection on 03/31/2020, at which time she reported, she had done well.   She had an interim injection with [name] on 07/12/2020.     She missed an injection in between and had her ninth injection with [name], nurse practitioner on 12/29/2019.   She had her 8th injection on 05/08/2019 at which time she reported that she had not taken Morgan [name].  She was unsure if she had any refills.  She presented with a migraine.  She tolerated Morgan Botox injection reasonably well.       I saw her on 01/30/2019 for her seventh injection, at which time she reported that Morgan Botox injections were very effective and that Morgan new medication [name] was more helpful for acute use than her triptan before.   I saw her on 10/24/2018 for her 6th injection, at which time she had noticed a recent flareup of her migraines.  She did not think that Morgan [name] had helped that much.  She was a little overdue for her Botox injection at Morgan time.   I saw her on 07/17/2018 for her fifth injection, at which time she reported Botox had been helpful, she reported overall very good results, no side effects, her migraines had reduced by at least 7 days a  month, she also reported reduction by at least 100 hours/month of her migraine headaches.  She did report that she had received cosmetic Botox injection in her eyebrow area.   I saw her on 04/10/2018 for her 4th injection, at which time she reported good results with her Botox injections in significant reduction in her headache frequency and severely overall. She denied any side effects. She was overdue by nearly 2 weeks for her injection at Morgan time.   I saw her on 12/26/2017 for her 3rd injection, at which time she reported having had a recent increase in her headache frequency, she had recent foot surgery.   I saw her on 09/25/2017 for her second round of injections. She felt that in Morgan week prior to Morgan injection her headaches were more intense.   I saw her on 06/28/2017 for her initial Botox injection, at which time she received 155 units of Botox.   She was seen by her  primary care physician and received a Toradol injection on 06/19/2017 as well as a prescription for Relpax. °  °04/25/17: Dr. Lewitt retired. She reports a long-standing history of migraine headaches, she recalls she was about 51 years old when she started having migraines. For quite some time she had spontaneous improvement in her migraines but some 4 or 5 years ago started having more frequent migraines. She has tried and failed multiple preventative medications but does not have a list with her and prior records from Dr. Lewitt's office are not available today. She reports that his office is closed at this point. She did not get her records from his office. Perhaps her primary care provider has records. I encouraged her to try to obtain those records as it will help us manage her migraines optimally. She recalls having tried Topamax. She had hair loss from some medications but is not completely sure which ones. Topamax did not help, Zonegran caused her sedation, nortriptyline was also tried but she is unsure what happened with it.  Amitriptyline she may have tried as well. She did not try Depakote. She is worried about sedation side effects as she also is going to school and has to be alert throughout Morgan day. She takes occasional nausea medicine which likely is Zofran under Morgan tongue. She has tried Maxalt under Morgan tongue which is helpful at times. She needs a refill on that. She has regular eye exams because of her history of dry eyes. She used to wear a single contact but was advised to stop using it. °She has had 2 rounds of Botox injections, last time about 3 months ago as she recalls. She remembers doing okay with it. Prior to starting Botox injections she reports having about 18-20 headache days per month typically. She does not always have a typical aura but sometimes has visual blurriness and bringing in her ears to warn her about an upcoming migraine headache. She has occasional nausea that precedes Morgan headache. °  °I saw her on 06/09/2015 at which time she was referred is a new Long referral for a new problem, referred by her optometrist at Morgan time for 1 month history of blurry vision. Her exam at Morgan time was nonfocal and reassuringly she had no significant eye related findings. I suggested further workup in Morgan form of visual evoked potentials, blood work, and she also reported a 6 month history of feeling tired. She had no one-sided weakness, tingling or numbness. Symptoms from Morgan past which included paresthesias had resolved completely. She was under Morgan care of Dr. Lewitt for migraines. She was in Morgan process of titrating Zonegran. She was on 225 mg each night. When she took 300 mg each night she felt too sleepy during Morgan day. She denied any symptoms of sleep disordered breathing. She did admit not being a good sleeper. 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 She reported not drinking sodas, not drinking alcohol and she reported not smoking. She  did report pain with eye movements and dry eyes. °She reported no family history of multiple sclerosis or lupus. She denied joint pain with Morgan exception of bilateral knee pain and she also reported a 50 pound weight gain in Morgan last year. She had visual evoked potentials on 07/06/2015: Impression: Morgan visual evoked response test above was within normal limits bilaterally. No evidence of conduction slowing was seen within Morgan anterior visual pathways on either   side on today's evaluation.   We called her with her test results. Labs from 06/09/2015 showed normal A1c, normal vitamin D level, normal ANA, normal RF, normal ESR. CRP was elevated at 15.6. 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   She had a brain MRI with and without contrast on 06/23/2015: IMPRESSION:  This is a normal MRI of Morgan brain with and without contrast In addition, personally reviewed Morgan images through Morgan PACS system. We called her with her test results.   I first met her on 05/22/2014 at Morgan request of her neurosurgeon, Dr. Kathyrn Sheriff, at which time Morgan Long reported intermittent right arm numbness, particularly with neck position changes. I suggested blood work and EMG and nerve conduction testing of Morgan right upper extremity. Her blood work showed elevated B12 and B6 levels, indicative of B vitamin supplementation. Hemoglobin A1c was 5.7. We called her with her test results. She had EMG and nerve conduction testing on 06/02/2014: IMPRESSION:   Nerve conduction studies done on both upper extremities were unremarkable, without evidence of a neuropathy seen. EMG evaluation of Morgan right upper extremity was unremarkable, without evidence of an overlying cervical radiculopathy. We called her with her test results. At Morgan time, she reported improved symptoms.    05/22/2014: She has intermittent right arm numbness. Her symptoms have been going on for about 6 months. She has not noted any permanent numbness and no issues elsewhere. She does not have any significant weakness and sometimes feels weak when Morgan numbness seems to come on. It goes away if she changes positions or adjusts her neck position. She has had right shoulder problems and pain in Morgan right shoulder.   You saw her on 05/14/2014 for neck pain. She had undergone physical therapy without improvement of her neck pain. She had cervical epidural steroid injections which helped for about 24 hours as understand.    She had a C-spine MRI without contrast on 01/07/2014: Mild cervical spondylosis as described above without significant disc protrusion, foraminal stenosis or central canal stenosis.   Blood work from 05/14/2014 was reviewed: She had a BMP, CBC with differential, liver function panel, lipid panel and TSH all of which were fine with Morgan exception of a borderline LDL of 111.

## 2021-07-13 NOTE — Progress Notes (Signed)
Botox- 200 units x 1 vial Lot: [id number] Expiration: 02/2024 NDC: 3744-5146-04  0.9% Sodium Chloride- 2.2 mL total Lot: [id number] Expiration: 07/07/2022 NDC: 1587-2761-84  Dx: Q59.276  B/B

## 2021-07-13 NOTE — Patient Instructions (Signed)
Please remember, botulinum toxin takes about 3-7 days to kick in. As discussed, this is not a pain shot. The purpose of the injections is to gradually improve your symptoms. In some patients it takes up to 2-3 weeks to make a difference and it wears off with time. Sometimes it may wear off before it is time for the next injection. We still should wait till the next 3 monthly injection, because injecting too frequently may cause you to develop immunity to the botulinum toxin. We are looking for a reduction in the severity and/or frequency of your symptoms. As a reminder, side effects to look out for are (but not limited to): mouth dryness, dryness of the eyes, heaviness of your head or muscle weakness, including droopy face or droopy eyelid(s), rarely: speech or swallowing difficulties and very rarely: breathing difficulties. Some people have transient neck pain or soreness which typically responds to over-the-counter anti-inflammatory medication and local heat application with a heat pad. If you think you have a severe reaction to the botulinum toxin, such as weakness, trouble speaking, trouble breathing, or trouble swallowing, you have to call 911 or have someone take you to the nearest emergency room. However, most people have either no or minimal side effects from the injections. It is normal to have a little bit of redness and swelling around the injection sites which usually improves after a few hours. Rarely, there may be a bruise that improves on its own. Most side effects reported are very mild and resolve within 10-14 days. Please feel free to call us if you have any additional questions or concerns: [phone] or email us through My Chart. We may have to adjust the dose over time, depending on your results from this injection and your overall response over time to this medication.   

## 2021-07-14 LAB — SURGICAL PATHOLOGY

## 2021-07-18 ENCOUNTER — Telehealth: Payer: Self-pay | Admitting: *Deleted

## 2021-07-18 ENCOUNTER — Other Ambulatory Visit: Payer: Self-pay | Admitting: Family Medicine

## 2021-07-18 DIAGNOSIS — E559 Vitamin D deficiency, unspecified: Secondary | ICD-10-CM

## 2021-07-18 NOTE — Telephone Encounter (Signed)
-----   Message from Wallace Going, DO sent at 07/18/2021  7:39 AM EST ----- Benign Please let patient know ----- Message ----- From: Interface, Lab In Three Zero One Sent: 07/14/2021   1:24 PM EST To: Loel Lofty Dillingham, DO

## 2021-07-18 NOTE — Telephone Encounter (Signed)
Called and spoke with the patient regarding surgical pathology results.  Patient verbalized understanding and agreed.//AB/CMA

## 2021-07-21 ENCOUNTER — Encounter (INDEPENDENT_AMBULATORY_CARE_PROVIDER_SITE_OTHER): Payer: Self-pay | Admitting: Family Medicine

## 2021-07-21 ENCOUNTER — Ambulatory Visit (INDEPENDENT_AMBULATORY_CARE_PROVIDER_SITE_OTHER): Payer: 59 | Admitting: Family Medicine

## 2021-07-21 ENCOUNTER — Other Ambulatory Visit: Payer: Self-pay

## 2021-07-21 VITALS — BP 107/69 | HR 88 | Temp 98.6°F | Ht 63.0 in | Wt 192.0 lb

## 2021-07-21 DIAGNOSIS — Z6835 Body mass index (BMI) 35.0-35.9, adult: Secondary | ICD-10-CM

## 2021-07-21 DIAGNOSIS — R7303 Prediabetes: Secondary | ICD-10-CM | POA: Diagnosis not present

## 2021-07-21 DIAGNOSIS — E559 Vitamin D deficiency, unspecified: Secondary | ICD-10-CM

## 2021-07-21 DIAGNOSIS — E669 Obesity, unspecified: Secondary | ICD-10-CM | POA: Diagnosis not present

## 2021-07-21 DIAGNOSIS — E538 Deficiency of other specified B group vitamins: Secondary | ICD-10-CM | POA: Diagnosis not present

## 2021-07-21 DIAGNOSIS — Z6836 Body mass index (BMI) 36.0-36.9, adult: Secondary | ICD-10-CM

## 2021-07-21 NOTE — Progress Notes (Signed)
Chief Complaint:   OBESITY Morgan Long is here to discuss her progress with her obesity treatment plan along with follow-up of her obesity related diagnoses. Morgan Long is on the Category 2 Plan and keeping a food journal and adhering to recommended goals of 200-300 calories and 20 grams of protein at breakfast daily and states she is following her eating plan approximately 90% of the time. [name] states she is walking for 30-60 minutes 3 times per week.  Today's visit was #: 10 Starting weight: 206 lbs Starting date: 02/02/2021 Today's weight: 192 lbs Today's date: 07/21/2021 Total lbs lost to date: 14 Total lbs lost since last in-office visit: 1  Interim History: [name] has done well with weight loss. She feels she is eating enough protein, but she struggles with drinking enough water due to her job.  Subjective:   1. Pre-diabetes Morgan Long is currently taking Ozempic 0.5 mg and was switched over to Victoza after her last visit, but hasn't started yet. She still had some Ozempic at home. She is concerned about out of pocket cost of Victoza.   2. Vitamin D deficiency Morgan Long is taking Vit D 50,000 IU weekly, and she denies side effects.   3. Vitamin B12 deficiency Morgan Long is taking monthly Vit B12 injections at her primary care physician's office.   Assessment/Plan:   1. Pre-diabetes Morgan Long will continue and finish Ozempic that she currently has. She plans to call her insurance to discuss the cost of Victoza and deductible and cover for [facility]. She will continue to work on weight loss, exercise, and decreasing simple carbohydrates to help decrease the risk of diabetes.   2. Vitamin D deficiency Low Vitamin D level contributes to fatigue and are associated with obesity, breast, and colon cancer. Morgan Long will continue prescription Vitamin D 50,000 IU every week as directed. She will follow-up for routine testing of Vitamin D, at least 2-3 times per year to avoid over-replacement.  3. Vitamin B12  deficiency The diagnosis was reviewed with the patient. Morgan Long will continue to follow up with her primary care physician as directed. Orders and follow up as documented in patient record.  4. Obesity with current BMI of 35.2 Morgan Long is currently in the action stage of change. As such, her goal is to continue with weight loss efforts. She has agreed to the Category 2 Plan.   Exercise goals: As is.  Behavioral modification strategies: increasing lean protein intake, increasing water intake, and no skipping meals.  Morgan Long has agreed to follow-up with our clinic in 2 weeks. She was informed of the importance of frequent follow-up visits to maximize her success with intensive lifestyle modifications for her multiple health conditions.   Objective:   Blood pressure 107/69, pulse 88, temperature 98.6 F (37 C), height 5\' 3"  (1.6 m), weight 192 lb (87.1 kg), last menstrual period 06/30/2021, SpO2 97 %. Body mass index is 34.01 kg/m.  General: Cooperative, alert, well developed, in no acute distress. HEENT: Conjunctivae and lids unremarkable. Cardiovascular: Regular rhythm.  Lungs: Normal work of breathing. Neurologic: No focal deficits.   Lab Results  Component Value Date   CREATININE 0.83 07/07/2021   BUN 13 07/07/2021   NA 139 07/07/2021   K 4.5 07/07/2021   CL 104 07/07/2021   CO2 22 07/07/2021   Lab Results  Component Value Date   ALT 18 07/07/2021   AST 18 07/07/2021   ALKPHOS 83 07/07/2021   BILITOT 0.2 07/07/2021   Lab Results  Component Value Date  HGBA1C 5.9 (H) 07/07/2021   HGBA1C 5.9 (H) 02/02/2021   HGBA1C 5.9 (H) 05/06/2018   HGBA1C 5.8 (H) 01/23/2018   HGBA1C 5.5 08/14/2017   Lab Results  Component Value Date   INSULIN 14.2 07/07/2021   INSULIN 12.8 02/02/2021   INSULIN 11.2 05/06/2018   INSULIN 8.4 01/23/2018   INSULIN 8.0 08/14/2017   Lab Results  Component Value Date   TSH 1.590 02/02/2021   Lab Results  Component Value Date   CHOL 195 07/07/2021    HDL 71 07/07/2021   LDLCALC 109 (H) 07/07/2021   TRIG 81 07/07/2021   CHOLHDL 2.7 07/07/2021   Lab Results  Component Value Date   VD25OH 60.7 07/07/2021   VD25OH 44.8 02/02/2021   VD25OH 35.92 11/02/2020   Lab Results  Component Value Date   WBC 7.1 02/21/2021   HGB 12.2 02/21/2021   HCT 37.6 02/21/2021   MCV 91.0 02/21/2021   PLT 355.0 02/21/2021   Lab Results  Component Value Date   FERRITIN 169 05/21/2019   Attestation Statements:   Reviewed by clinician on day of visit: allergies, medications, problem list, medical history, surgical history, family history, social history, and previous encounter notes.  Time spent on visit including pre-visit chart review and post-visit care and charting was 30 minutes.    I, Trixie Dredge, am acting as transcriptionist for Dennard Nip, MD.  I have reviewed the above documentation for accuracy and completeness, and I agree with the above. -  Dennard Nip, MD

## 2021-07-22 ENCOUNTER — Encounter: Payer: Self-pay | Admitting: Plastic Surgery

## 2021-07-22 ENCOUNTER — Ambulatory Visit: Payer: 59 | Admitting: Plastic Surgery

## 2021-07-22 DIAGNOSIS — L989 Disorder of the skin and subcutaneous tissue, unspecified: Secondary | ICD-10-CM

## 2021-07-22 NOTE — Progress Notes (Signed)
The patient is a 51 year old female here for follow-up after undergoing excision of a facial skin lesion.  It was compatible with a benign fibrous papule negative for malignancy.  She is healing nicely.  The suture was removed and a fresh Steri-Strip was applied.  I recommend a Steri-Strip for 1 more week.  After that she can start a scar cream.

## 2021-07-25 ENCOUNTER — Telehealth: Payer: Self-pay | Admitting: Cardiology

## 2021-07-25 ENCOUNTER — Other Ambulatory Visit: Payer: Self-pay | Admitting: Family Medicine

## 2021-07-25 ENCOUNTER — Telehealth (HOSPITAL_COMMUNITY): Payer: Self-pay | Admitting: *Deleted

## 2021-07-25 ENCOUNTER — Encounter: Payer: Self-pay | Admitting: Family Medicine

## 2021-07-25 ENCOUNTER — Other Ambulatory Visit (HOSPITAL_COMMUNITY): Payer: Self-pay | Admitting: *Deleted

## 2021-07-25 DIAGNOSIS — F4000 Agoraphobia, unspecified: Secondary | ICD-10-CM

## 2021-07-25 DIAGNOSIS — Z01812 Encounter for preprocedural laboratory examination: Secondary | ICD-10-CM

## 2021-07-25 MED ORDER — ALPRAZOLAM 0.5 MG PO TABS: 0.5000 mg | ORAL_TABLET | Freq: Every evening | ORAL | 0 refills | Status: DC | PRN

## 2021-07-25 NOTE — Telephone Encounter (Signed)
New Message:      Patient is scheduled for MRI on Thursday. She said something in usually called in to help her relax. Would you please call that in for her.

## 2021-07-25 NOTE — Telephone Encounter (Signed)
Reaching out to patient to offer assistance regarding upcoming cardiac imaging study; pt verbalizes understanding of appt date/time, parking situation and where to check in, and verified current allergies; name and call back number provided for further questions should they arise  Gordy Clement RN [facility] [phone] office [phone] cell  Patient aware to obtain blood work prior to her cardiac MRI appointment. She denies metal but does report claustrophobia. She had contacted her PCP for medications for the MRI.

## 2021-07-25 NOTE — Telephone Encounter (Signed)
Spoke to patient just now and let her know Dr. Joya Gaskins recommendations from her telephone note from 06/28/21. She verbalizes understanding again and states that she will call her PCP for this.    Encouraged patient to call back with any questions or concerns.

## 2021-07-25 NOTE — Progress Notes (Signed)
Patient is a pleasant 51 year old female who presents to clinic for follow-up after surgical excision of facial skin lesion performed 07/12/2021 by Dr. Marla Roe.  Pathology revealed benign fibrous papule, negative for malignancy.  She was seen most recently on 07/22/2021 by Dr. Marla Roe at which point she was healing nicely.  Suture was removed and a fresh Steri-Strip was applied.  Recommended to be removed at subsequent appointment at which point she can begin scar cream.  Today, Steri-Strip remains intact.  She denies any pain, redness, or other symptoms.  Steri-Strips removed here in clinic without difficulty or complication.  Physical exam is entirely reassuring.  The tissues are very well approximated.  No redness or concern for infection.  No suture to remove.  She has provided her pathology report.  She states that she already purchased [place] and will begin that starting today.  Recommend twice daily x60 days.  She understands the need to keep it covered and protected from UV radiation.  Sunscreen if she plans to be outside.  No specific follow-up needed.  She will call the clinic should she have any additional questions or concerns.  Picture(s) obtained of the patient and placed in the chart were with the patient's or guardian's permission.

## 2021-07-27 ENCOUNTER — Other Ambulatory Visit: Payer: Self-pay

## 2021-07-27 ENCOUNTER — Ambulatory Visit: Payer: 59 | Admitting: Physician Assistant

## 2021-07-27 DIAGNOSIS — Z01812 Encounter for preprocedural laboratory examination: Secondary | ICD-10-CM | POA: Diagnosis not present

## 2021-07-27 DIAGNOSIS — L989 Disorder of the skin and subcutaneous tissue, unspecified: Secondary | ICD-10-CM

## 2021-07-28 ENCOUNTER — Ambulatory Visit (HOSPITAL_COMMUNITY)
Admission: RE | Admit: 2021-07-28 | Discharge: 2021-07-28 | Disposition: A | Discharge status: Home / Self Care | Payer: 59 | Source: Ambulatory Visit | Attending: Cardiology | Admitting: Cardiology

## 2021-07-28 DIAGNOSIS — R072 Precordial pain: Secondary | ICD-10-CM

## 2021-07-28 LAB — HEMOGLOBIN AND HEMATOCRIT, BLOOD
Hematocrit: 38.2 % (ref 34.0–46.6)
Hemoglobin: 12.8 g/dL (ref 11.1–15.9)

## 2021-07-28 MED ORDER — GADOBUTROL 1 MMOL/ML IV SOLN
10.0000 mL | Freq: Once | INTRAVENOUS | Status: AC | PRN
  Administered 2021-07-28: 10 mL via INTRAVENOUS

## 2021-07-29 ENCOUNTER — Telehealth: Payer: Self-pay

## 2021-07-29 NOTE — Telephone Encounter (Signed)
-----   Message from Richardo Priest, MD sent at 07/29/2021  8:04 AM EST ----- Regarding: FW: Normal CBC ----- Message ----- From: Lavone Neri Lab Results In Sent: 07/28/2021   8:37 AM EST To: Richardo Priest, MD

## 2021-07-29 NOTE — Telephone Encounter (Signed)
-----   Message from Richardo Priest, MD sent at 07/29/2021  8:00 AM EST ----- Regarding: FW: Great result no findings of cardiac injuries  Normal cardiac MRI ----- Message ----- From: Buel Ream, Rad Results In Sent: 07/28/2021   4:32 PM EST To: Richardo Priest, MD

## 2021-07-29 NOTE — Telephone Encounter (Signed)
Patient notified of results.

## 2021-07-31 ENCOUNTER — Encounter: Payer: Self-pay | Admitting: Plastic Surgery

## 2021-08-01 ENCOUNTER — Ambulatory Visit (INDEPENDENT_AMBULATORY_CARE_PROVIDER_SITE_OTHER): Payer: 59 | Admitting: Family Medicine

## 2021-08-01 ENCOUNTER — Other Ambulatory Visit: Payer: Self-pay

## 2021-08-01 ENCOUNTER — Encounter (INDEPENDENT_AMBULATORY_CARE_PROVIDER_SITE_OTHER): Payer: Self-pay | Admitting: Family Medicine

## 2021-08-01 VITALS — BP 105/69 | HR 80 | Temp 97.7°F | Ht 63.0 in | Wt 190.0 lb

## 2021-08-01 DIAGNOSIS — Z6833 Body mass index (BMI) 33.0-33.9, adult: Secondary | ICD-10-CM

## 2021-08-01 DIAGNOSIS — Z9189 Other specified personal risk factors, not elsewhere classified: Secondary | ICD-10-CM

## 2021-08-01 DIAGNOSIS — E669 Obesity, unspecified: Secondary | ICD-10-CM | POA: Diagnosis not present

## 2021-08-01 DIAGNOSIS — R7303 Prediabetes: Secondary | ICD-10-CM | POA: Diagnosis not present

## 2021-08-02 NOTE — Progress Notes (Signed)
Chief Complaint:   OBESITY Morgan Long is here to discuss her progress with her obesity treatment plan along with follow-up of her obesity related diagnoses. Morgan Long is on the Category 2 Plan and states she is following her eating plan approximately 95% of the time. Morgan Long states she is walking for 60 minutes 3 times per week.  Today's visit was #: 11 Starting weight: 206 lbs Starting date: 02/02/2021 Today's weight: 190 lbs Today's date: 08/01/2021 Total lbs lost to date: 16 Total lbs lost since last in-office visit: 2  Interim History: Morgan Long continues to do well with weight loss. She is working on not skipping meals and decreasing snacking.  Subjective:   1. Pre-diabetes Morgan Long is on Ozempic and she is going to change to Victoza when this medications is finished, due to insurance coverage issues. She denies nausea or vomiting, and she notes decreased polyphagia.  2. At risk for diabetes mellitus Morgan Long is at higher than average risk for developing diabetes due to her obesity.  Assessment/Plan:   1. Pre-diabetes Morgan Long agreed to start Victoza at 0.6 mg (no refill needed), when Ozempic is finished and will discontinue Ozempic. She will continue to work on weight loss, exercise, and decreasing simple carbohydrates to help decrease the risk of diabetes.   2. At risk for diabetes mellitus Morgan Long was given approximately 15 minutes of diabetic education and counseling today. We discussed intensive lifestyle modifications today with an emphasis on weight loss as well as increasing exercise and decreasing simple carbohydrates in her diet. We also reviewed medication options with an emphasis on risk versus benefits of those discussed.  Repetitive spaced learning was employed today to elicit superior memory formation and behavioral change.  3. Obesity with current BMI of 33.8 Morgan Long is currently in the action stage of change. As such, her goal is to continue with weight loss efforts. She has agreed to the  Category 2 Plan.   Exercise goals: As is.  Behavioral modification strategies: increasing lean protein intake and decreasing simple carbohydrates.  Morgan Long has agreed to follow-up with our clinic in 3 to 4 weeks. She was informed of the importance of frequent follow-up visits to maximize her success with intensive lifestyle modifications for her multiple health conditions.   Objective:   Blood pressure 105/69, pulse 80, temperature 97.7 F (36.5 C), height 5\' 3"  (1.6 m), weight 190 lb (86.2 kg), SpO2 99 %. Body mass index is 33.66 kg/m.  General: Cooperative, alert, well developed, in no acute distress. HEENT: Conjunctivae and lids unremarkable. Cardiovascular: Regular rhythm.  Lungs: Normal work of breathing. Neurologic: No focal deficits.   Lab Results  Component Value Date   CREATININE 0.83 07/07/2021   BUN 13 07/07/2021   NA 139 07/07/2021   K 4.5 07/07/2021   CL 104 07/07/2021   CO2 22 07/07/2021   Lab Results  Component Value Date   ALT 18 07/07/2021   AST 18 07/07/2021   ALKPHOS 83 07/07/2021   BILITOT 0.2 07/07/2021   Lab Results  Component Value Date   HGBA1C 5.9 (H) 07/07/2021   HGBA1C 5.9 (H) 02/02/2021   HGBA1C 5.9 (H) 05/06/2018   HGBA1C 5.8 (H) 01/23/2018   HGBA1C 5.5 08/14/2017   Lab Results  Component Value Date   INSULIN 14.2 07/07/2021   INSULIN 12.8 02/02/2021   INSULIN 11.2 05/06/2018   INSULIN 8.4 01/23/2018   INSULIN 8.0 08/14/2017   Lab Results  Component Value Date   TSH 1.590 02/02/2021   Lab Results  Component Value Date   CHOL 195 07/07/2021   HDL 71 07/07/2021   LDLCALC 109 (H) 07/07/2021   TRIG 81 07/07/2021   CHOLHDL 2.7 07/07/2021   Lab Results  Component Value Date   VD25OH 60.7 07/07/2021   VD25OH 44.8 02/02/2021   VD25OH 35.92 11/02/2020   Lab Results  Component Value Date   WBC 7.1 02/21/2021   HGB 12.8 07/27/2021   HCT 38.2 07/27/2021   MCV 91.0 02/21/2021   PLT 355.0 02/21/2021   Lab Results  Component  Value Date   FERRITIN 169 05/21/2019   Attestation Statements:   Reviewed by clinician on day of visit: allergies, medications, problem list, medical history, surgical history, family history, social history, and previous encounter notes.   I, Trixie Dredge, am acting as transcriptionist for Dennard Nip, MD.  I have reviewed the above documentation for accuracy and completeness, and I agree with the above. -  Dennard Nip, MD

## 2021-08-16 ENCOUNTER — Other Ambulatory Visit: Payer: Self-pay

## 2021-08-16 ENCOUNTER — Ambulatory Visit (INDEPENDENT_AMBULATORY_CARE_PROVIDER_SITE_OTHER): Payer: 59 | Admitting: Physician Assistant

## 2021-08-16 ENCOUNTER — Encounter (INDEPENDENT_AMBULATORY_CARE_PROVIDER_SITE_OTHER): Payer: Self-pay | Admitting: Physician Assistant

## 2021-08-16 VITALS — BP 101/64 | HR 68 | Temp 97.9°F | Ht 63.0 in | Wt 186.0 lb

## 2021-08-16 DIAGNOSIS — Z6832 Body mass index (BMI) 32.0-32.9, adult: Secondary | ICD-10-CM | POA: Diagnosis not present

## 2021-08-16 DIAGNOSIS — R7303 Prediabetes: Secondary | ICD-10-CM | POA: Diagnosis not present

## 2021-08-16 DIAGNOSIS — E669 Obesity, unspecified: Secondary | ICD-10-CM | POA: Diagnosis not present

## 2021-08-17 NOTE — Progress Notes (Signed)
? ? ? ?Chief Complaint:  ? ?OBESITY ?Morgan Long is here to discuss her progress with her obesity treatment plan along with follow-up of her obesity related diagnoses. Morgan Long is on the Category 2 Plan and states she is following her eating plan approximately 85% of the time. Morgan Long states she is walking for 60 minutes 4 times per week. ? ?Today's visit was #: 12 ?Starting weight: 206 lbs ?Starting date: 02/02/2021 ?Today's weight: 186 lbs ?Today's date: 08/17/2021 ?Total lbs lost to date: 20 lbs ?Total lbs lost since last in-office visit: 4 lbs ? ?Interim History: Morgan Long is still on Ozempic 0.5 mg and will start Victoza when finished with Ozempic. She is trying to eat more meat although there are times when she snacks instead of eating on entire meal.  ? ?Subjective:  ? ?1. Pre-diabetes ?Morgan Long last A1C was 5.9. She is on Ozempic 0.5 mg.  ? ?Assessment/Plan:  ? ?1. Pre-diabetes ?Morgan Long will change to Victoza 0.6 mg after finished with Ozempic. She will continue to work on weight loss, exercise, and decreasing simple carbohydrates to help decrease the risk of diabetes.  ? ?2. Obesity with current BMI of 32.96 ?Morgan Long is currently in the action stage of change. As such, her goal is to continue with weight loss efforts. She has agreed to the Category 2 Plan.  ? ?Exercise goals:  As is. ? ?Behavioral modification strategies: increasing lean protein intake and meal planning and cooking strategies. ? ?Morgan Long has agreed to follow-up with our clinic in 3 weeks. She was informed of the importance of frequent follow-up visits to maximize her success with intensive lifestyle modifications for her multiple health conditions.  ? ?Objective:  ? ?Blood pressure 101/64, pulse 68, temperature 97.9 ?F (36.6 ?C), height '5\' 3"'$  (1.6 m), weight 186 lb (84.4 kg), SpO2 96 %. ?Body mass index is 32.95 kg/m?. ? ?General: Cooperative, alert, well developed, in no acute distress. ?HEENT: Conjunctivae and lids unremarkable. ?Cardiovascular: Regular rhythm.   ?Lungs: Normal work of breathing. ?Neurologic: No focal deficits.  ? ?Lab Results  ?Component Value Date  ? CREATININE 0.83 07/07/2021  ? BUN 13 07/07/2021  ? NA 139 07/07/2021  ? K 4.5 07/07/2021  ? CL 104 07/07/2021  ? CO2 22 07/07/2021  ? ?Lab Results  ?Component Value Date  ? ALT 18 07/07/2021  ? AST 18 07/07/2021  ? ALKPHOS 83 07/07/2021  ? BILITOT 0.2 07/07/2021  ? ?Lab Results  ?Component Value Date  ? HGBA1C 5.9 (H) 07/07/2021  ? HGBA1C 5.9 (H) 02/02/2021  ? HGBA1C 5.9 (H) 05/06/2018  ? HGBA1C 5.8 (H) 01/23/2018  ? HGBA1C 5.5 08/14/2017  ? ?Lab Results  ?Component Value Date  ? INSULIN 14.2 07/07/2021  ? INSULIN 12.8 02/02/2021  ? INSULIN 11.2 05/06/2018  ? INSULIN 8.4 01/23/2018  ? INSULIN 8.0 08/14/2017  ? ?Lab Results  ?Component Value Date  ? TSH 1.590 02/02/2021  ? ?Lab Results  ?Component Value Date  ? CHOL 195 07/07/2021  ? HDL 71 07/07/2021  ? LDLCALC 109 (H) 07/07/2021  ? TRIG 81 07/07/2021  ? CHOLHDL 2.7 07/07/2021  ? ?Lab Results  ?Component Value Date  ? VD25OH 60.7 07/07/2021  ? VD25OH 44.8 02/02/2021  ? VD25OH 35.92 11/02/2020  ? ?Lab Results  ?Component Value Date  ? WBC 7.1 02/21/2021  ? HGB 12.8 07/27/2021  ? HCT 38.2 07/27/2021  ? MCV 91.0 02/21/2021  ? PLT 355.0 02/21/2021  ? ?Lab Results  ?Component Value Date  ? FERRITIN 169 05/21/2019  ? ?  Attestation Statements:  ? ?Reviewed by clinician on day of visit: allergies, medications, problem list, medical history, surgical history, family history, social history, and previous encounter notes. ? ?Time spent on visit including pre-visit chart review and post-visit care and charting was 30 minutes.  ? ?I, Tonye Pearson, am acting as Location manager for [employer], PA-C. ? ?I have reviewed the above documentation for accuracy and completeness, and I agree with the above. Abby Potash, PA-C ? ?

## 2021-08-19 DIAGNOSIS — N393 Stress incontinence (female) (male): Secondary | ICD-10-CM | POA: Diagnosis not present

## 2021-08-22 ENCOUNTER — Encounter: Payer: Self-pay | Admitting: Family Medicine

## 2021-08-22 ENCOUNTER — Other Ambulatory Visit: Payer: Self-pay | Admitting: Obstetrics and Gynecology

## 2021-08-22 DIAGNOSIS — Z1231 Encounter for screening mammogram for malignant neoplasm of breast: Secondary | ICD-10-CM

## 2021-08-23 ENCOUNTER — Encounter (INDEPENDENT_AMBULATORY_CARE_PROVIDER_SITE_OTHER): Payer: Self-pay | Admitting: Family Medicine

## 2021-08-23 ENCOUNTER — Encounter: Payer: Self-pay | Admitting: Family Medicine

## 2021-08-23 NOTE — Telephone Encounter (Signed)
Please see message and advise.  Thank you. ° °

## 2021-08-25 ENCOUNTER — Ambulatory Visit (INDEPENDENT_AMBULATORY_CARE_PROVIDER_SITE_OTHER): Payer: 59 | Admitting: Family Medicine

## 2021-08-25 ENCOUNTER — Encounter: Payer: Self-pay | Admitting: Family Medicine

## 2021-08-25 VITALS — BP 120/80 | HR 84 | Temp 98.9°F | Resp 18 | Ht 63.0 in | Wt 189.4 lb

## 2021-08-25 DIAGNOSIS — Z111 Encounter for screening for respiratory tuberculosis: Secondary | ICD-10-CM | POA: Diagnosis not present

## 2021-08-25 DIAGNOSIS — H1013 Acute atopic conjunctivitis, bilateral: Secondary | ICD-10-CM | POA: Diagnosis not present

## 2021-08-25 MED ORDER — LINACLOTIDE 72 MCG PO CAPS: 72.0000 ug | ORAL_CAPSULE | Freq: Every day | ORAL | 6 refills | Status: DC

## 2021-08-25 MED ORDER — ACETIC ACID 2 % OT SOLN
4.0000 [drp] | OTIC | 0 refills | Status: DC
Start: 2021-08-25 — End: 2021-12-20

## 2021-08-25 MED ORDER — AZELASTINE HCL 0.05 % OP SOLN: 1.0000 [drp] | Freq: Two times a day (BID) | OPHTHALMIC | 12 refills | Status: DC

## 2021-08-25 NOTE — Assessment & Plan Note (Signed)
rx per orders ?F/u prn  ?

## 2021-08-25 NOTE — Progress Notes (Signed)
? ?Subjective:  ? ?By signing my name below, I, Morgan Long, attest that this documentation has been prepared under the direction and in the presence of [name], DO. 08/25/2021 ? ? ? Patient ID: Morgan Long, female    DOB: 05-11-1971, 51 y.o.   MRN: [id number] ? ?Chief Complaint  ?Patient presents with  ?? Sinus Problem  ?  Pt states sxs started 2-3 weeks ago. Pt states not trying anything OTC. Pt states no other sxs.   ? ? ?Sinus Problem ?Pertinent negatives include no congestion, headaches or shortness of breath.  ? ?Patient is in today for a office visit. ? ?She complains of itching and dry eyes for the past 2-3 weeks. She also reports having episodes of lacrimation at night. She reports having her eye lashes done a 2-3 weeks ago and thought the person doing the service did not wash her hands. She is applying eye drops to manage her symptoms. She denies having any congestion at this time.  ?She also complains of irritation and itching in both ears. Her left ear is worse than her right. 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 She is inserting Q-tips to manage her itch. She also notes she typically gets a build up of ear wax prior to developing sinus issues.  ?She is requesting a TB test for her workplace.  ?She reports starting ozempic and having improvement in her weight loss.  ?Wt Readings from Last 3 Encounters:  ?08/25/21 189 lb 6.4 oz (85.9 kg)  ?08/16/21 186 lb (84.4 kg)  ?08/01/21 190 lb (86.2 kg)  ? ? ?Past Medical History:  ?Diagnosis Date  ?? Allergic rhinitis   ?? Asthma   ?? B12 deficiency   ?? Chronic migraine   ? Dr. Catalina Gravel  ?? Constipation   ?? Dry eye syndrome of both lacrimal glands   ?? Dry skin   ?? Endometriosis   ?? Fatigue   ?? Gastroparesis   ?? GERD (gastroesophageal reflux disease)   ?? Headache(784.0)   ? occasional, dx w/ Migraines before, Topamax helps  ?? Heartburn   ?? Hepatic steatosis   ?? History of stomach ulcers   ?? Lactose intolerance   ??  Localized edema   ?? Migraine   ?? Muscle weakness (generalized)   ?? Nuclear cataract of both eyes   ? Mild  ?? Overweight   ?? Pain in right ankle and joints of right foot   ?? Prediabetes   ?? Shortness of breath   ?? Sinus complaint   ?? Sinusitis   ?? Tired   ?? TMJ pain dysfunction syndrome   ? occasional  ?? Vitamin D deficiency   ? ? ?Past Surgical History:  ?Procedure Laterality Date  ?? ANKLE SURGERY Left 11/16/2017  ?? AUGMENTATION MAMMAPLASTY Bilateral 2006  ?? BREAST ENHANCEMENT SURGERY  2006  ?? BUNIONECTOMY    ?? COLONOSCOPY    ?? endrometroisis    ?? fallopian tube removed    ? Left  ?? LAPAROSCOPIC PARTIAL HEPATECTOMY    ? [facility] --- due to hepatic tumor  ?? UPPER GASTROINTESTINAL ENDOSCOPY    ?? [facility] teeth extraction    ? ? ?Family History  ?Problem Relation Age of Onset  ?? Throat cancer Mother   ?? Cancer - Other Mother 50  ?     throat - died 6 months  ?? Cancer Mother   ?? Alcoholism Mother   ?? Stroke Father   ?? Heart disease  Father   ?? Cancer Father   ?? Colon cancer Paternal Grandfather   ?? Breast cancer Maternal Aunt   ?     breast  ?? Breast cancer Maternal Aunt   ?     breast  ?? Pancreatic cancer Maternal Aunt   ?? Hypertension Maternal Aunt   ?     several family members  ?? Breast cancer Maternal Aunt   ?     total of 5 aunts  ?? Breast cancer Maternal Aunt   ?? Colon cancer Maternal Uncle 63  ?     died 74  ?? Throat cancer Maternal Uncle   ?? Diabetes Other   ?     grandmother  ?? Asthma Other   ?     cousin, maternal  ?? Heart attack Neg Hx   ?? Rectal cancer Neg Hx   ?? Stomach cancer Neg Hx   ? ? ?Social History  ? ?Socioeconomic History  ?? Marital status: Single  ?  Spouse name: Not on file  ?? Number of children: 0  ?? Years of education: BS  ?? Highest education level: Not on file  ?Occupational History  ?? Occupation: Paramedic, going to school  ?  Employer: [employer]  ?? Occupation: Air traffic controller  ?Tobacco Use  ?? Smoking status: Never  ?   Passive exposure: Never  ?? Smokeless tobacco: Never  ?Vaping Use  ?? Vaping Use: Never used  ?Substance and Sexual Activity  ?? Alcohol use: Not Currently  ?  Comment: socially - occasional   ?? Drug use: No  ?? Sexual activity: Yes  ?  Birth control/protection: Inserts  ?  Comment: nuvaring  ?Other Topics Concern  ?? Not on file  ?Social History Narrative  ? Household:sister and her 3 kids   ? Drinks occasional starbucks drink   ?   ? ?Social Determinants of Health  ? ?Financial Resource Strain: Not on file  ?Food Insecurity: Not on file  ?Transportation Needs: Not on file  ?Physical Activity: Not on file  ?Stress: Not on file  ?Social Connections: Not on file  ?Intimate Partner Violence: Not on file  ? ? ?Outpatient Medications Prior to Visit  ?Medication Sig Dispense Refill  ?? cetirizine (ZYRTEC) 10 MG tablet TAKE 1 TABLET BY MOUTH DAILY 30 tablet 5  ?? clindamycin (CLEOCIN T) 1 % external solution Apply topically 2 (two) times daily. 30 mL 5  ?? cyanocobalamin (,VITAMIN B-12,) 1000 MCG/ML injection Inject 1,000 mcg into the muscle every 30 (thirty) days.    ?? ESTROGEL 0.75 MG/1.25 GM (0.06%) topical gel Apply 1 application topically daily.  0  ?? etonogestrel-ethinyl estradiol (NUVARING) 0.12-0.015 MG/24HR vaginal ring Place 1 each vaginally every 28 (twenty-eight) days. Insert vaginally and leave in place for 3 consecutive weeks, then remove for 1 week.    ?? linaclotide (LINZESS) 145 MCG CAPS capsule Take 1 capsule by mouth daily as needed for constipation.    ?? linaclotide (LINZESS) 72 MCG capsule Take 1 capsule (72 mcg total) by mouth daily before breakfast. 30 capsule 6  ?? liraglutide (VICTOZA) 18 MG/3ML SOPN Inject 0.6 mg into the skin daily. 6 mL 0  ?? Multiple Vitamin (MULTIVITAMIN) LIQD Take 5 mLs by mouth daily.    ?? PREVIDENT 5000 SENSITIVE 1.1-5 % PSTE USE BEFORE BED DO NOT RINSE    ?? triamcinolone cream (KENALOG) 0.1 % APPLY TO AFFECTED AREA TWICE A DAY 30 g 0  ?? [name] 50 MG  TABS 1 PILL AT  ONSET OF MIGRAINE, MAY REPEAT IN 2 HOURS, NO MORE THAN 2 PILLS IN 24 HOURS.). 10 tablet 3  ?? Vitamin D, Ergocalciferol, (DRISDOL) 1.25 MG (50000 UNIT) CAPS capsule TAKE 1 CAPSULE (50,000 UNITS TOTAL) BY MOUTH EVERY 7 (SEVEN) DAYS 12 capsule 1  ? ?No facility-administered medications prior to visit.  ? ? ?Allergies  ?Allergen Reactions  ?? Acetaminophen-Codeine Itching  ?? Hydrocodone Nausea Only  ?? Oxycodone Itching  ?? Tyloxapol Other (See Comments)  ?  Other reaction(s): Unknown  ?? Codeine Itching and Nausea Only  ? ? ?Review of Systems  ?Constitutional:  Negative for fever and malaise/fatigue.  ?HENT:  Negative for congestion.   ?Eyes:  Negative for blurred vision.  ?     (+)itching ?(+)dry eyes ?(+)lacrimation at night  ?Respiratory:  Negative for shortness of breath.   ?Cardiovascular:  Negative for chest pain, palpitations and leg swelling.  ?Gastrointestinal:  Negative for abdominal pain, blood in stool and nausea.  ?Genitourinary:  Negative for dysuria and frequency.  ?Musculoskeletal:  Negative for falls.  ?Skin:  Positive for itching (bilateral ears (R>L)). Negative for rash.  ?Neurological:  Negative for dizziness, loss of consciousness and headaches.  ?Endo/Heme/Allergies:  Negative for environmental allergies.  ?Psychiatric/Behavioral:  Negative for depression. The patient is not nervous/anxious.   ? ?   ?Objective:  ?  ?Physical Exam ?Vitals and nursing note reviewed.  ?Constitutional:   ?   General: She is not in acute distress. ?   Appearance: Normal appearance. She is well-developed. She is not ill-appearing.  ?HENT:  ?   Head: Normocephalic and atraumatic.  ?   Right Ear: External ear normal. There is impacted cerumen.  ?   Left Ear: Tympanic membrane, ear canal and external ear normal.  ?Eyes:  ?   General: Lids are normal.  ?   Extraocular Movements: Extraocular movements intact.  ?   Conjunctiva/sclera:  ?   Right eye: Right conjunctiva is injected.  ?   Left eye: Left conjunctiva is  injected.  ?   Pupils: Pupils are equal, round, and reactive to light.  ?   Comments: Watery eyes  ?Burning per pt   ?Neck:  ?   Thyroid: No thyromegaly.  ?   Vascular: No carotid bruit or JVD.  ?Cardiovascular:  ?

## 2021-08-25 NOTE — Patient Instructions (Signed)
Allergic Conjunctivitis, Adult ?Allergic conjunctivitis is inflammation of the conjunctiva. The conjunctiva is the thin, clear membrane that covers the white part of the eye and the inner surface of the eyelid. In this condition: ?The blood vessels in the conjunctiva become irritated and swell. ?The eyes become red or pink and feel itchy. ?Allergic conjunctivitis cannot be spread from person to person. This condition can develop at any age and may be outgrown. ?What are the causes? ?This condition is caused by allergens. These are things that can cause an allergic reaction in some people but not in other people. Common allergens include: ?Outdoor allergens, such as: ?Pollen, including pollen from grass and weeds. ?Mold spores. ?Car fumes. ?Indoor allergens, such as: ?Dust. ?Smoke. ?Mold spores. ?Proteins in a pet's urine, saliva, or dander. ?What increases the risk? ?You may be more likely to develop this condition if you have a family history of these things: ?Allergies. ?Conditions caused by being exposed to allergens, such as: ?Allergic rhinitis. This is an allergic reaction that affects the nose. ?Bronchial asthma. This condition affects the large airways in the lungs and makes breathing difficult. ?Atopic dermatitis (eczema). This is inflammation of the skin that is long-term (chronic). ?What are the signs or symptoms? ?Symptoms of this condition include eyes that are: ?Itchy. ?Red. ?Watery. ?Puffy. ?Your eyes may also: ?Sting or burn. ?Have clear fluid draining from them. ?Have thick mucus discharge and pain (vernal conjunctivitis). ?How is this diagnosed? ?This condition may be diagnosed by: ?Your medical history. ?A physical exam. ?Tests of the fluid draining from your eyes to rule out other causes. ?Other tests to confirm the diagnosis, including: ?Testing for allergies. The skin may be pricked with a tiny needle. The pricked area is then exposed to small amounts of allergens. ?Testing for other eye  conditions. Tests may include: ?Blood tests. ?Tissue scrapings from your eyelid. The tissue is then checked under a microscope. ?How is this treated? ?This condition may be treated with: ?Cold, wet cloths (cold compresses) to soothe itching and swelling. ?Washing the face to remove allergens. ?Eye drops. These may be prescription or over-the-counter. You may need to try different types to see which one works best for you, such as: ?Eye drops that block the allergic reaction (antihistamine). ?Eye drops that reduce swelling and irritation (anti-inflammatory). ?Steroid eye drops, which may be given if other treatments have not worked (vernal conjunctivitis). ?Oral antihistamine medicines. These are medicines taken by mouth to lessen your allergic reaction. You may need these if eye drops do not help or are difficult to use. ?Follow these instructions at home: ?Eye care ?Apply a clean, cold compress to your eyes for 10-20 minutes, 3-4 times a day. ?Do not touch or rub your eyes. ?Do not wear contact lenses until the inflammation is gone. Wear glasses instead. ?Do not wear eye makeup until the inflammation is gone. ?General instructions ?Avoid known allergens whenever possible. ?Take or apply over-the-counter and prescription medicines only as told by your health care provider. These include any eye drops. ?Drink enough fluid to keep your urine pale yellow. ?Keep all follow-up visits as told by your health care provider. This is important. ?Contact a health care provider if: ?Your symptoms get worse or do not get better with treatment. ?You have mild eye pain. ?You become sensitive to light. ?You have spots or blisters on your eyes. ?You have pus draining from your eyes. ?You have a fever. ?Get help right away if: ?You have redness, swelling, or other symptoms in  only one eye. ?Your vision is blurred or you have other vision changes. ?You have severe eye pain. ?Summary ?Allergic conjunctivitis is inflammation of the  clear membrane that covers the white part of the eye and the inner surface of the eyelid. ?Take or apply over-the-counter and prescription medicines only as told by your health care provider. These include eye drops. ?Do not touch or rub your eyes. ?Contact a health care provider if your symptoms get worse or do not get better with treatment. ?This information is not intended to replace advice given to you by your health care provider. Make sure you discuss any questions you have with your health care provider. ?Document Revised: 04/14/2019 Document Reviewed: 04/14/2019 ?Elsevier Patient Education ? [place]. ? ?

## 2021-08-28 ENCOUNTER — Encounter (INDEPENDENT_AMBULATORY_CARE_PROVIDER_SITE_OTHER): Payer: Self-pay | Admitting: Family Medicine

## 2021-08-30 ENCOUNTER — Telehealth: Payer: Self-pay

## 2021-08-30 ENCOUNTER — Encounter: Payer: Self-pay | Admitting: Family Medicine

## 2021-08-30 DIAGNOSIS — N393 Stress incontinence (female) (male): Secondary | ICD-10-CM | POA: Diagnosis not present

## 2021-08-30 LAB — QUANTIFERON-TB GOLD PLUS
Mitogen-NIL: >10 IU/mL
NIL: 0.09 IU/mL
QuantiFERON-TB Gold Plus: NEGATIVE
TB1-NIL: <0 IU/mL
TB2-NIL: <0 IU/mL

## 2021-08-30 NOTE — Telephone Encounter (Signed)
PA initiated via Covermymeds; KEY: [id number]. Awaiting determination.  ?

## 2021-08-31 ENCOUNTER — Other Ambulatory Visit: Payer: Self-pay | Admitting: Family Medicine

## 2021-08-31 ENCOUNTER — Telehealth: Payer: Self-pay

## 2021-08-31 DIAGNOSIS — K5904 Chronic idiopathic constipation: Secondary | ICD-10-CM

## 2021-08-31 MED ORDER — LUBIPROSTONE 24 MCG PO CAPS: 24.0000 ug | ORAL_CAPSULE | Freq: Two times a day (BID) | ORAL | 2 refills | Status: DC

## 2021-08-31 NOTE — Telephone Encounter (Signed)
PA denied. Formulary alternatives; lubiprostone ? ? ?Coverage for this medication is denied for the following reason(s). We reviewed the information we ?received about your condition and circumstances. We used the The plan approved policy when making ?this decision. The policy states that this medication may be approved when: ?- The member has a clinical condition or needs a specific dosage form for which there is no alternative on ?the formulary OR ?- The listed formulary alternatives are not recommended based on published guidelines or clinical ?literature OR ?- The formulary alternatives will likely be ineffective or less effective for the member OR ?- The formulary alternatives will likely cause an adverse effect OR ?- The member is unable to take the required number of formulary alternatives for the given diagnosis ?due to a trial and inadequate treatment response or contraindication OR ?- The member has tried and failed the required number of formulary alternatives ? ? ? ?

## 2021-08-31 NOTE — Telephone Encounter (Signed)
PA initiated via Covermymeds; KEY: [id number]. PA cancelled by plan.  ? ?Your PA has been resolved, no additional PA is required. For further inquiries please contact the number on the back of the member prescription card. (Message [phone]) ?

## 2021-09-02 ENCOUNTER — Telehealth: Payer: Self-pay | Admitting: Family Medicine

## 2021-09-02 NOTE — Telephone Encounter (Signed)
Pt has not picked up [employer] form(2 pages) - since 10-18-2020 Document was mailed out to pt's address.  ?

## 2021-09-05 ENCOUNTER — Telehealth: Payer: Self-pay | Admitting: Neurology

## 2021-09-05 ENCOUNTER — Other Ambulatory Visit: Payer: Self-pay | Admitting: Family Medicine

## 2021-09-05 ENCOUNTER — Telehealth: Payer: Self-pay | Admitting: *Deleted

## 2021-09-05 DIAGNOSIS — L232 Allergic contact dermatitis due to cosmetics: Secondary | ICD-10-CM

## 2021-09-05 NOTE — Telephone Encounter (Signed)
Dr [name] Eagles, Dr Rexene Alberts do you both agree to the switch? Pt has been seeing Dr Rexene Alberts and Amy since 2020.  ?

## 2021-09-05 NOTE — Telephone Encounter (Signed)
I called pt and let her know that per Dr. [name] Eagles, she did not have anything more to offer then Amy NP or Dr. Rexene Alberts. Confirmed [facility] appt 10-06-2021 with Dr. Rexene Alberts.  Pt confirmed and appreciated call back.  ?

## 2021-09-05 NOTE — Telephone Encounter (Signed)
Melvenia Beam, MD  Marikay Alar; Star Age, MD; Ward Givens, NP; [facility] ?I do not see any benefit to changing to me, Ive reviewed chart and I cannot provide anything more for her than my colleagues can thanks   ?  ?   ?Previous Messages ?  ?----- Message -----  ?From: Larence Penning B  ?Sent: 09/05/2021   9:48 AM EDT  ?To: Ward Givens, NP, Star Age, MD, *  ?Subject: Request to change neurologist                  ? ?Pt requesting to change neurologist to Dr. [name] Eagles for migraines. Pt refuse to elaborate on the reason for the change in neurologist. Pt would like back to be notified of decision.  ? ?Contact info [phone]  ?

## 2021-09-05 NOTE — Telephone Encounter (Signed)
Pt requesting to change neurologist to Dr. [name] Eagles for migraines. Pt refuse to elaborate on the reason for the change in neurologist. Pt would like call back to be notified of decision. ? ? ?

## 2021-09-06 ENCOUNTER — Ambulatory Visit (INDEPENDENT_AMBULATORY_CARE_PROVIDER_SITE_OTHER): Payer: 59 | Admitting: Family Medicine

## 2021-09-08 ENCOUNTER — Ambulatory Visit
Admission: RE | Admit: 2021-09-08 | Discharge: 2021-09-08 | Disposition: A | Discharge status: Home / Self Care | Payer: 59 | Source: Ambulatory Visit | Attending: Obstetrics and Gynecology | Admitting: Obstetrics and Gynecology

## 2021-09-08 DIAGNOSIS — Z1231 Encounter for screening mammogram for malignant neoplasm of breast: Secondary | ICD-10-CM

## 2021-09-12 ENCOUNTER — Encounter (INDEPENDENT_AMBULATORY_CARE_PROVIDER_SITE_OTHER): Payer: Self-pay | Admitting: Family Medicine

## 2021-09-12 NOTE — Telephone Encounter (Signed)
Last OV with Tracey 

## 2021-09-15 ENCOUNTER — Ambulatory Visit (INDEPENDENT_AMBULATORY_CARE_PROVIDER_SITE_OTHER): Payer: 59 | Admitting: Family Medicine

## 2021-09-15 VITALS — BP 128/80 | HR 91 | Temp 98.0°F | Resp 16 | Ht 63.0 in | Wt 190.0 lb

## 2021-09-15 DIAGNOSIS — H6122 Impacted cerumen, left ear: Secondary | ICD-10-CM

## 2021-09-15 NOTE — Patient Instructions (Signed)
Earwax Buildup, Adult ?The ears produce a substance called earwax that helps keep bacteria out of the ear and protects the skin in the ear canal. Occasionally, earwax can build up in the ear and cause discomfort or hearing loss. ?What are the causes? ?This condition is caused by a buildup of earwax. Ear canals are self-cleaning. Ear wax is made in the outer part of the ear canal and generally falls out in small amounts over time. ?When the self-cleaning mechanism is not working, earwax builds up and can cause decreased hearing and discomfort. Attempting to clean ears with cotton swabs can push the earwax deep into the ear canal and cause decreased hearing and pain. ?What increases the risk? ?This condition is more likely to develop in people who: ?Clean their ears often with cotton swabs. ?Pick at their ears. ?Use earplugs or in-ear headphones often, or wear hearing aids. ?The following factors may also make you more likely to develop this condition: ?Being female. ?Being of older age. ?Naturally producing more earwax. ?Having narrow ear canals. ?Having earwax that is overly thick or sticky. ?Having excess hair in the ear canal. ?Having eczema. ?Being dehydrated. ?What are the signs or symptoms? ?Symptoms of this condition include: ?Reduced or muffled hearing. ?A feeling of fullness in the ear or feeling that the ear is plugged. ?Fluid coming from the ear. ?Ear pain or an itchy ear. ?Ringing in the ear. ?Coughing. ?Balance problems. ?An obvious piece of earwax that can be seen inside the ear canal. ?How is this diagnosed? ?This condition may be diagnosed based on: ?Your symptoms. ?Your medical history. ?An ear exam. During the exam, your health care provider will look into your ear with an instrument called an otoscope. ?You may have tests, including a hearing test. ?How is this treated? ?This condition may be treated by: ?Using ear drops to soften the earwax. ?Having the earwax removed by a health care provider. The  health care provider may: ?Flush the ear with water. ?Use an instrument that has a loop on the end (curette). ?Use a suction device. ?Having surgery to remove the wax buildup. This may be done in severe cases. ?Follow these instructions at home: ? ?Take over-the-counter and prescription medicines only as told by your health care provider. ?Do not put any objects, including cotton swabs, into your ear. You can clean the opening of your ear canal with a washcloth or facial tissue. ?Follow instructions from your health care provider about cleaning your ears. Do not overclean your ears. ?Drink enough fluid to keep your urine pale yellow. This will help to thin the earwax. ?Keep all follow-up visits as told. If earwax builds up in your ears often or if you use hearing aids, consider seeing your health care provider for routine, preventive ear cleanings. Ask your health care provider how often you should schedule your cleanings. ?If you have hearing aids, clean them according to instructions from the manufacturer and your health care provider. ?Contact a health care provider if: ?You have ear pain. ?You develop a fever. ?You have pus or other fluid coming from your ear. ?You have hearing loss. ?You have ringing in your ears that does not go away. ?You feel like the room is spinning (vertigo). ?Your symptoms do not improve with treatment. ?Get help right away if: ?You have bleeding from the affected ear. ?You have severe ear pain. ?Summary ?Earwax can build up in the ear and cause discomfort or hearing loss. ?The most common symptoms of this condition include   reduced or muffled hearing, a feeling of fullness in the ear, or feeling that the ear is plugged. ?This condition may be diagnosed based on your symptoms, your medical history, and an ear exam. ?This condition may be treated by using ear drops to soften the earwax or by having the earwax removed by a health care provider. ?Do not put any objects, including cotton  swabs, into your ear. You can clean the opening of your ear canal with a washcloth or facial tissue. ?This information is not intended to replace advice given to you by your health care provider. Make sure you discuss any questions you have with your health care provider. ?Document Revised: 09/09/2019 Document Reviewed: 09/09/2019 ?Elsevier Patient Education ? 2022 [employer] Inc[employer] ? ?

## 2021-09-15 NOTE — Progress Notes (Addendum)
? ?Subjective:  ? ?By signing my name below, I, Morgan Long, attest that this documentation has been prepared under the direction and in the presence of Morgan Held, DO. 09/15/2021 ?  ? ? Patient ID: Morgan Long, female    DOB: 11-24-1970, 51 y.o.   MRN: [id number] ? ?Chief Complaint  ?Patient presents with  ? Ear Fullness  ?  Here for Ear Fullness  ? ? ?Ear Fullness  ?Pertinent negatives include no coughing, headaches, rash or vomiting.  ?Patient is in today for a follow up visit.  ? ?She started using ear wax removal drops since last visit but continues having ear fullness in her left ear. She reports developing minor migraines since her last visit but thinks it may be due to chewing hard candy. Otherwise she has no change in her symptoms.  ?She reports her insurance changed covereage of constipation medication. She reports while taking her new medication she found no change in her symptoms.  ? ? ?Past Medical History:  ?Diagnosis Date  ? Allergic rhinitis   ? Asthma   ? B12 deficiency   ? Chronic migraine   ? Dr. Catalina Gravel  ? Constipation   ? Dry eye syndrome of both lacrimal glands   ? Dry skin   ? Endometriosis   ? Fatigue   ? Gastroparesis   ? GERD (gastroesophageal reflux disease)   ? Headache(784.0)   ? occasional, dx w/ Migraines before, Topamax helps  ? Heartburn   ? Hepatic steatosis   ? History of stomach ulcers   ? Lactose intolerance   ? Localized edema   ? Migraine   ? Muscle weakness (generalized)   ? Nuclear cataract of both eyes   ? Mild  ? Overweight   ? Pain in right ankle and joints of right foot   ? Prediabetes   ? Shortness of breath   ? Sinus complaint   ? Sinusitis   ? Tired   ? TMJ pain dysfunction syndrome   ? occasional  ? Vitamin D deficiency   ? ? ?Past Surgical History:  ?Procedure Laterality Date  ? ANKLE SURGERY Left 11/16/2017  ? AUGMENTATION MAMMAPLASTY Bilateral 2006  ? BREAST ENHANCEMENT SURGERY  2006  ? BUNIONECTOMY    ? COLONOSCOPY    ? endrometroisis    ? fallopian  tube removed    ? Left  ? LAPAROSCOPIC PARTIAL HEPATECTOMY    ? [facility] --- due to hepatic tumor  ? UPPER GASTROINTESTINAL ENDOSCOPY    ? [facility] teeth extraction    ? ? ?Family History  ?Problem Relation Age of Onset  ? Throat cancer Mother   ? Cancer - Other Mother 21  ?     throat - died 6 months  ? Cancer Mother   ? Alcoholism Mother   ? Stroke Father   ? Heart disease Father   ? Cancer Father   ? Colon cancer Paternal Grandfather   ? Breast cancer Maternal Aunt   ?     breast  ? Breast cancer Maternal Aunt   ?     breast  ? Pancreatic cancer Maternal Aunt   ? Hypertension Maternal Aunt   ?     several family members  ? Breast cancer Maternal Aunt   ?     total of 5 aunts  ? Breast cancer Maternal Aunt   ? Colon cancer Maternal Uncle 30  ?     died 16  ? Throat cancer  Maternal Uncle   ? Diabetes Other   ?     grandmother  ? Asthma Other   ?     cousin, maternal  ? Heart attack Neg Hx   ? Rectal cancer Neg Hx   ? Stomach cancer Neg Hx   ? ? ?Social History  ? ?Socioeconomic History  ? Marital status: Single  ?  Spouse name: Not on file  ? Number of children: 0  ? Years of education: BS  ? Highest education level: Not on file  ?Occupational History  ? Occupation: Paramedic, going to school  ?  Employer: [employer]  ? Occupation: Air traffic controller  ?Tobacco Use  ? Smoking status: Never  ?  Passive exposure: Never  ? Smokeless tobacco: Never  ?Vaping Use  ? Vaping Use: Never used  ?Substance and Sexual Activity  ? Alcohol use: Not Currently  ?  Comment: socially - occasional   ? Drug use: No  ? Sexual activity: Yes  ?  Birth control/protection: Inserts  ?  Comment: nuvaring  ?Other Topics Concern  ? Not on file  ?Social History Narrative  ? Household:sister and her 3 kids   ? Drinks occasional starbucks drink   ?   ? ?Social Determinants of Health  ? ?Financial Resource Strain: Not on file  ?Food Insecurity: Not on file  ?Transportation Needs: Not on file  ?Physical Activity: Not on file  ?Stress:  Not on file  ?Social Connections: Not on file  ?Intimate Partner Violence: Not on file  ? ? ?Outpatient Medications Prior to Visit  ?Medication Sig Dispense Refill  ? acetic acid 2 % otic solution Place 4 drops into the right ear every 3 (three) hours. 15 mL 0  ? azelastine (OPTIVAR) 0.05 % ophthalmic solution Place 1 drop into both eyes 2 (two) times daily. 6 mL 12  ? cetirizine (ZYRTEC) 10 MG tablet TAKE 1 TABLET BY MOUTH DAILY 30 tablet 5  ? clindamycin (CLEOCIN T) 1 % external solution Apply topically 2 (two) times daily. 30 mL 5  ? cyanocobalamin (,VITAMIN B-12,) 1000 MCG/ML injection Inject 1,000 mcg into the muscle every 30 (thirty) days.    ? ESTROGEL 0.75 MG/1.25 GM (0.06%) topical gel Apply 1 application topically daily.  0  ? etonogestrel-ethinyl estradiol (NUVARING) 0.12-0.015 MG/24HR vaginal ring Place 1 each vaginally every 28 (twenty-eight) days. Insert vaginally and leave in place for 3 consecutive weeks, then remove for 1 week.    ? lubiprostone (AMITIZA) 24 MCG capsule Take 1 capsule (24 mcg total) by mouth 2 (two) times daily with a meal. 60 capsule 2  ? Multiple Vitamin (MULTIVITAMIN) LIQD Take 5 mLs by mouth daily.    ? PREVIDENT 5000 SENSITIVE 1.1-5 % PSTE USE BEFORE BED DO NOT RINSE    ? triamcinolone cream (KENALOG) 0.1 % APPLY TO AFFECTED AREA TWICE A DAY 30 g 0  ? [name] 50 MG TABS 1 PILL AT ONSET OF MIGRAINE, MAY REPEAT IN 2 HOURS, NO MORE THAN 2 PILLS IN 24 HOURS.). 10 tablet 3  ? Vitamin D, Ergocalciferol, (DRISDOL) 1.25 MG (50000 UNIT) CAPS capsule TAKE 1 CAPSULE (50,000 UNITS TOTAL) BY MOUTH EVERY 7 (SEVEN) DAYS 12 capsule 1  ? liraglutide (VICTOZA) 18 MG/3ML SOPN Inject 0.6 mg into the skin daily. 6 mL 0  ? ?No facility-administered medications prior to visit.  ? ? ?Allergies  ?Allergen Reactions  ? Acetaminophen-Codeine Itching  ? Hydrocodone Nausea Only  ? Oxycodone Itching  ?  Tyloxapol Other (See Comments)  ?  Other reaction(s): Unknown  ? Codeine Itching and Nausea Only   ? ? ?Review of Systems  ?Constitutional:  Negative for fever and malaise/fatigue.  ?HENT:  Positive for ear pain. Negative for congestion.   ?Eyes:  Negative for blurred vision.  ?Respiratory:  Negative for cough and shortness of breath.   ?Cardiovascular:  Negative for chest pain, palpitations and leg swelling.  ?Gastrointestinal:  Negative for vomiting.  ?Musculoskeletal:  Negative for back pain.  ?Skin:  Negative for rash.  ?Neurological:  Negative for loss of consciousness and headaches.  ? ?   ?Objective:  ?  ?Physical Exam ?Vitals and nursing note reviewed.  ?Constitutional:   ?   General: She is not in acute distress. ?   Appearance: Normal appearance. She is not ill-appearing.  ?HENT:  ?   Head: Normocephalic and atraumatic.  ?   Right Ear: Tympanic membrane, ear canal and external ear normal.  ?   Left Ear: External ear normal. There is impacted cerumen.  ?   Ears:  ?   Comments: Ears were irrigated during visit after pt consented ? ?Tm clear and canal normal ?Pt tolerated procedure well ?Eyes:  ?   Extraocular Movements: Extraocular movements intact.  ?   Pupils: Pupils are equal, round, and reactive to light.  ?Cardiovascular:  ?   Rate and Rhythm: Normal rate and regular rhythm.  ?   Heart sounds: Normal heart sounds. No murmur heard. ?  No gallop.  ?Pulmonary:  ?   Effort: Pulmonary effort is normal. No respiratory distress.  ?   Breath sounds: Normal breath sounds. No wheezing or rales.  ?Skin: ?   General: Skin is warm and dry.  ?Neurological:  ?   Mental Status: She is alert and oriented to person, place, and time.  ?Psychiatric:     ?   Judgment: Judgment normal.  ? ? ?BP 128/80 (BP Location: Right Arm, Patient Position: Sitting, Cuff Size: Normal)   Pulse 91   Temp 98 ?F (36.7 ?C) (Oral)   Resp 16   Ht '5\' 3"'$  (1.6 m)   Wt 190 lb (86.2 kg)   SpO2 98%   BMI 33.66 kg/m?  ?Wt Readings from Last 3 Encounters:  ?10/06/21 192 lb 3.2 oz (87.2 kg)  ?10/03/21 186 lb (84.4 kg)  ?09/15/21 190 lb (86.2  kg)  ? ? ?Diabetic Foot Exam - Simple   ?No data filed ?  ? ?Lab Results  ?Component Value Date  ? WBC 7.1 02/21/2021  ? HGB 12.8 07/27/2021  ? HCT 38.2 07/27/2021  ? PLT 355.0 02/21/2021  ? GLUCOSE 87 07/07/2021  ? C

## 2021-09-16 ENCOUNTER — Encounter: Payer: Self-pay | Admitting: Family Medicine

## 2021-09-19 ENCOUNTER — Encounter: Payer: Self-pay | Admitting: Neurology

## 2021-09-19 ENCOUNTER — Other Ambulatory Visit: Payer: Self-pay | Admitting: Family Medicine

## 2021-09-19 ENCOUNTER — Ambulatory Visit (INDEPENDENT_AMBULATORY_CARE_PROVIDER_SITE_OTHER): Payer: 59 | Admitting: Physician Assistant

## 2021-09-19 DIAGNOSIS — N760 Acute vaginitis: Secondary | ICD-10-CM

## 2021-09-19 MED ORDER — FLUCONAZOLE 150 MG PO TABS: ORAL_TABLET | ORAL | 0 refills | Status: DC

## 2021-09-19 NOTE — Telephone Encounter (Signed)
Do you want to see patient

## 2021-09-20 ENCOUNTER — Encounter (INDEPENDENT_AMBULATORY_CARE_PROVIDER_SITE_OTHER): Payer: Self-pay | Admitting: Family Medicine

## 2021-09-20 NOTE — Telephone Encounter (Signed)
Morgan Long 

## 2021-09-21 ENCOUNTER — Encounter: Payer: Self-pay | Admitting: Family Medicine

## 2021-09-23 ENCOUNTER — Encounter: Payer: Self-pay | Admitting: Family Medicine

## 2021-09-25 ENCOUNTER — Other Ambulatory Visit (INDEPENDENT_AMBULATORY_CARE_PROVIDER_SITE_OTHER): Payer: Self-pay | Admitting: Family Medicine

## 2021-09-25 DIAGNOSIS — R7303 Prediabetes: Secondary | ICD-10-CM

## 2021-09-26 ENCOUNTER — Other Ambulatory Visit: Payer: Self-pay | Admitting: Family Medicine

## 2021-09-26 MED ORDER — TERCONAZOLE 0.4 % VA CREA: 1.0000 | TOPICAL_CREAM | Freq: Every day | VAGINAL | 0 refills | Status: DC

## 2021-09-30 ENCOUNTER — Encounter: Payer: Self-pay | Admitting: Family Medicine

## 2021-09-30 ENCOUNTER — Telehealth: Payer: Self-pay | Admitting: *Deleted

## 2021-09-30 NOTE — Telephone Encounter (Signed)
Her prior Morgan Long is awaiting determination and will not get an answer by the weekend.  Can she get something over the counter? ?

## 2021-09-30 NOTE — Telephone Encounter (Signed)
See other mychart message.

## 2021-09-30 NOTE — Telephone Encounter (Signed)
Prior auth started via cover my meds.  Awaiting determination. ? ?Key: [id number] ?

## 2021-10-03 ENCOUNTER — Encounter (INDEPENDENT_AMBULATORY_CARE_PROVIDER_SITE_OTHER): Payer: Self-pay | Admitting: Family Medicine

## 2021-10-03 ENCOUNTER — Ambulatory Visit (INDEPENDENT_AMBULATORY_CARE_PROVIDER_SITE_OTHER): Payer: 59 | Admitting: Family Medicine

## 2021-10-03 VITALS — BP 105/64 | HR 79 | Temp 98.2°F | Ht 63.0 in | Wt 186.0 lb

## 2021-10-03 DIAGNOSIS — Z6833 Body mass index (BMI) 33.0-33.9, adult: Secondary | ICD-10-CM

## 2021-10-03 DIAGNOSIS — K5909 Other constipation: Secondary | ICD-10-CM | POA: Diagnosis not present

## 2021-10-03 DIAGNOSIS — E669 Obesity, unspecified: Secondary | ICD-10-CM

## 2021-10-03 DIAGNOSIS — Z9189 Other specified personal risk factors, not elsewhere classified: Secondary | ICD-10-CM

## 2021-10-03 DIAGNOSIS — R7303 Prediabetes: Secondary | ICD-10-CM | POA: Diagnosis not present

## 2021-10-03 DIAGNOSIS — E66812 Obesity, class 2: Secondary | ICD-10-CM

## 2021-10-03 DIAGNOSIS — L409 Psoriasis, unspecified: Secondary | ICD-10-CM | POA: Diagnosis not present

## 2021-10-03 DIAGNOSIS — L658 Other specified nonscarring hair loss: Secondary | ICD-10-CM | POA: Diagnosis not present

## 2021-10-03 DIAGNOSIS — L219 Seborrheic dermatitis, unspecified: Secondary | ICD-10-CM | POA: Diagnosis not present

## 2021-10-04 ENCOUNTER — Encounter: Payer: Self-pay | Admitting: *Deleted

## 2021-10-04 DIAGNOSIS — K5909 Other constipation: Secondary | ICD-10-CM | POA: Insufficient documentation

## 2021-10-04 DIAGNOSIS — Z9189 Other specified personal risk factors, not elsewhere classified: Secondary | ICD-10-CM | POA: Insufficient documentation

## 2021-10-04 MED ORDER — LIRAGLUTIDE 18 MG/3ML ~~LOC~~ SOPN: 0.6000 mg | PEN_INJECTOR | Freq: Every day | SUBCUTANEOUS | 0 refills | Status: DC

## 2021-10-04 NOTE — Telephone Encounter (Signed)
We?re pleased to let you know that we?ve approved your or your doctor?s request for coverage ?for Lubiprostone Caps. You can now fill your prescription, and it will be covered according to ?your plan. ?As long as you remain covered by your prescription drug plan and there are no changes to your ?plan benefits, this request is approved from 09/30/2021 to 09/30/2024. When this approval ?expires, please speak to your doctor about your treatment. ?

## 2021-10-05 ENCOUNTER — Encounter (INDEPENDENT_AMBULATORY_CARE_PROVIDER_SITE_OTHER): Payer: Self-pay | Admitting: Family Medicine

## 2021-10-05 ENCOUNTER — Telehealth (INDEPENDENT_AMBULATORY_CARE_PROVIDER_SITE_OTHER): Payer: 59 | Admitting: Internal Medicine

## 2021-10-05 ENCOUNTER — Encounter: Payer: Self-pay | Admitting: Pulmonary Disease

## 2021-10-05 ENCOUNTER — Encounter: Payer: Self-pay | Admitting: Internal Medicine

## 2021-10-05 DIAGNOSIS — Z760 Encounter for issue of repeat prescription: Secondary | ICD-10-CM | POA: Diagnosis not present

## 2021-10-05 DIAGNOSIS — Z124 Encounter for screening for malignant neoplasm of cervix: Secondary | ICD-10-CM | POA: Diagnosis not present

## 2021-10-05 DIAGNOSIS — Z6834 Body mass index (BMI) 34.0-34.9, adult: Secondary | ICD-10-CM | POA: Diagnosis not present

## 2021-10-05 DIAGNOSIS — J45909 Unspecified asthma, uncomplicated: Secondary | ICD-10-CM

## 2021-10-05 DIAGNOSIS — Z01419 Encounter for gynecological examination (general) (routine) without abnormal findings: Secondary | ICD-10-CM | POA: Diagnosis not present

## 2021-10-05 LAB — HM PAP SMEAR: HM Pap smear: NORMAL

## 2021-10-05 MED ORDER — ALBUTEROL SULFATE HFA 108 (90 BASE) MCG/ACT IN AERS: INHALATION_SPRAY | RESPIRATORY_TRACT | 0 refills | Status: DC

## 2021-10-05 NOTE — Patient Instructions (Signed)
Only use your albuterol as a rescue medication to be used if you can't catch your breath by resting or doing a relaxed purse lip breathing pattern.  ?- The less you use it, the better it will work when you need it. ?- Ok to use up to 2 puffs  every 4 hours if you must but call for immediate appointment if use goes up over your usual need ?- Don't leave home without it !!  (think of it like the spare tire for your car)  ? ?Work on inhaler technique:  relax and gently blow all the way out then take a nice smooth full deep breath back in, triggering the inhaler at same time you start breathing in.  Hold for up to 5 seconds if you can. Blow out thru nose. Rinse and gargle with water when done.  If mouth or throat bother you at all,  try brushing teeth/gums/tongue with arm and hammer toothpaste/ make a slurry and gargle and spit out.  ? ?   ?Call if not improving by weekend  or if need the albuterol for than a few times a week after 2 weeks  ?

## 2021-10-05 NOTE — Assessment & Plan Note (Signed)
Mild flare on no maint rx ? ?Rule of 2s reviewed/ f/u Dr Elsworth Soho ?

## 2021-10-05 NOTE — Telephone Encounter (Signed)
Called and spoke with pt about her mychart message and have scheduled her for a video visit with MW at 11:30. Nothing further needed. ?

## 2021-10-05 NOTE — Progress Notes (Signed)
? ?Morgan Long, female    DOB: April 19, 1971,   MRN: [id number] ? ? ?Brief patient profile:  ?2 yobf  never smoker seen for virtual ov 10/05/2021 for increased noct wheeze  ? ? ? ?History of Present Illness  ? ?Virtual Visit via Telephone Note 10/05/2021  ? ?I connected with Morgan Long on 10/05/21 at 11:30 AM EDT by Mychart virually and verified that I am speaking with the correct person using two identifiers. Pt is at home and this call made from my office with no other participants  ?  ?I discussed the limitations, risks, security and privacy concerns of performing an evaluation and management service by telephone and the availability of in person appointments. I also discussed with the patient that there may be a patient responsible charge related to this service. The patient expressed understanding and agreed to proceed. ? ?   ? ?Morgan Gully, MD  ?10/05/2021  Pulmonary/ 1st  eval/Morgan Long  sob x 3 days  last episode 4 m prior  ?Chief Complaint  ?Patient presents with  ? Acute Visit  ?  Pt has had complaints of increased SOB and some wheezing for the past 3 days.  ?Dyspnea:  onset 3 days prior to OV  better p 2 puffs of saba from relative of hers  around 10 pm 5/2   then slept  ?Cough:  non productive  ?Sleep: 30 degrees with pillows  ? ?No obvious patterms/triggers day to day or daytime variability or assoc excess/ purulent sputum or mucus plugs or hemoptysis or cp or chest tightness, subjective wheeze or overt sinus or hb symptoms.  ? ? Also denies any obvious fluctuation of symptoms with weather or environmental changes or other aggravating or alleviating factors except as outlined above  ? ?No unusual exposure hx or h/o childhood pna/ asthma or knowledge of premature birth. ? ?Current Allergies, Complete Past Medical History, Past Surgical History, Family History, and Social History were reviewed in [employer] record. ? ?  ?      ?   ? ?Past Medical History:  ?Diagnosis Date  ? Allergic  rhinitis   ? Asthma   ? B12 deficiency   ? Chronic migraine   ? Dr. Catalina Long  ? Constipation   ? Dry eye syndrome of both lacrimal glands   ? Dry skin   ? Endometriosis   ? Fatigue   ? Gastroparesis   ? GERD (gastroesophageal reflux disease)   ? Headache(784.0)   ? occasional, dx w/ Migraines before, Topamax helps  ? Heartburn   ? Hepatic steatosis   ? History of stomach ulcers   ? Lactose intolerance   ? Localized edema   ? Migraine   ? Muscle weakness (generalized)   ? Nuclear cataract of both eyes   ? Mild  ? Overweight   ? Pain in right ankle and joints of right foot   ? Prediabetes   ? Shortness of breath   ? Sinus complaint   ? Sinusitis   ? Tired   ? TMJ pain dysfunction syndrome   ? occasional  ? Vitamin D deficiency   ? ? ?Outpatient Medications Prior to Visit  ?Medication Sig Dispense Refill  ? azelastine (OPTIVAR) 0.05 % ophthalmic solution Place 1 drop into both eyes 2 (two) times daily. 6 mL 12  ? cetirizine (ZYRTEC) 10 MG tablet TAKE 1 TABLET BY MOUTH DAILY 30 tablet 5  ? clindamycin (CLEOCIN T) 1 % external solution Apply topically 2 (  two) times daily. 30 mL 5  ? cyanocobalamin (,VITAMIN B-12,) 1000 MCG/ML injection Inject 1,000 mcg into the muscle every 30 (thirty) days.    ? ESTROGEL 0.75 MG/1.25 GM (0.06%) topical gel Apply 1 application topically daily.  0  ? etonogestrel-ethinyl estradiol (NUVARING) 0.12-0.015 MG/24HR vaginal ring Place 1 each vaginally every 28 (twenty-eight) days. Insert vaginally and leave in place for 3 consecutive weeks, then remove for 1 week.    ? liraglutide (VICTOZA) 18 MG/3ML SOPN Inject 0.6 mg into the skin daily. 6 mL 0  ? lubiprostone (AMITIZA) 24 MCG capsule Take 1 capsule (24 mcg total) by mouth 2 (two) times daily with a meal. 60 capsule 2  ? Multiple Vitamin (MULTIVITAMIN) LIQD Take 5 mLs by mouth daily.    ? PREVIDENT 5000 SENSITIVE 1.1-5 % PSTE USE BEFORE BED DO NOT RINSE    ? terconazole (TERAZOL 7) 0.4 % vaginal cream Place 1 applicator vaginally at bedtime. 45  g 0  ? triamcinolone cream (KENALOG) 0.1 % APPLY TO AFFECTED AREA TWICE A DAY 30 g 0  ? [name] 50 MG TABS 1 PILL AT ONSET OF MIGRAINE, MAY REPEAT IN 2 HOURS, NO MORE THAN 2 PILLS IN 24 HOURS.). 10 tablet 3  ? Vitamin D, Ergocalciferol, (DRISDOL) 1.25 MG (50000 UNIT) CAPS capsule TAKE 1 CAPSULE (50,000 UNITS TOTAL) BY MOUTH EVERY 7 (SEVEN) DAYS 12 capsule 1  ? acetic acid 2 % otic solution Place 4 drops into the right ear every 3 (three) hours. (Patient not taking: Reported on 10/05/2021) 15 mL 0  ? fluconazole (DIFLUCAN) 150 MG tablet 1 po x1, may repeat in 3 days prn 2 tablet 0  ? ?No facility-administered medications prior to visit.  ? ? ? ?Objective:  ?  ?Amb bm nad/ looks healthy at rest on video ?No rattling or wheeze on voluntary cough nor conversational sob ? ?   ? ?Imp: ?Mild noct asthma flare not typically on maint rx for asthma and may need to restart symbicort @ 80 2bid if continues to flare / break rule of 2's on just saba but for now rec ? ?- The proper method of use, as well as anticipated side effects, of a metered-dose inhaler were discussed and demonstrated to the patient using teach back method.  ? ?Re SABA :  I spent extra time with pt today reviewing appropriate use of albuterol for prn use on exertion with the following points: ?1) saba is for relief of sob that does not improve by walking a slower pace or resting but rather if the pt does not improve after trying this first. ?2) If the pt is convinced, as many are, that saba helps recover from activity faster then it's easy to tell if this is the case by re-challenging : ie stop, take the inhaler, then p 5 minutes try the exact same activity (intensity of workload) that just caused the symptoms and see if they are substantially diminished or not after saba ?3) if there is an activity that reproducibly causes the symptoms, try the saba 15 min before the activity on alternate days  ? ?If in fact the saba really does help, then fine to continue to  use it prn but advised may need to look closer at the maintenance regimen (which is for  now nothing) being used to achieve better control of airways symptoms.   ? ?>> other concern is gerd but hold off adding gerd rx for now and just rec elevate HOB ? ? ?  F/u with Dr Elsworth Soho, here prn  ? ? ?Total time for visit = 21 minutes ? ?Morgan Gully, MD ?Pulmonary and Critical Care Medicine ?[place] ?  ?  ? ? ?  ?

## 2021-10-06 ENCOUNTER — Ambulatory Visit (INDEPENDENT_AMBULATORY_CARE_PROVIDER_SITE_OTHER): Payer: 59 | Admitting: Neurology

## 2021-10-06 VITALS — BP 119/76 | HR 83 | Ht 63.0 in | Wt 192.2 lb

## 2021-10-06 DIAGNOSIS — G43719 Chronic migraine without aura, intractable, without status migrainosus: Secondary | ICD-10-CM | POA: Diagnosis not present

## 2021-10-06 MED ORDER — ONABOTULINUMTOXINA 100 UNITS IJ SOLR
200.0000 [IU] | Freq: Once | INTRAMUSCULAR | Status: AC
  Administered 2021-10-06: 200 [IU] via INTRAMUSCULAR

## 2021-10-06 NOTE — Progress Notes (Signed)
Subjective:  ?  ?Patient ID: Morgan Long is a 51 y.o. female. ? ?HPI ? ?Morgan Long is a 51 year old right-handed woman with an underlying medical history of allergic rhinitis, endometriosis, TMJ problems, Reflux disease, gastroparesis, hepatic congestion with steatosis, s/p partial hepatectomy for hepatic tumor, lactose intolerance, neck pain, and obesity, who presents for her 16 th botox injection for her intractable migraines. The patient is unaccompanied today. She had her 15th injection on 07/13/21 and reporting doing well.  ?  ?Today, 10/06/2021: She reports doing well with the last Botox injections. She continues to benefit from the injections.  She had no side effects.   ?  ?Written informed consent for recurrent, 3 monthly intramuscular injections with botulinum toxin for this indication has previously been obtained and scanned into the patient's electronic chart. I will re-consent if the type of botulinum toxin used or the indication for injection changes for this patient in the future. The patient is informed that we will use the same consent for her recurrent, most likely 3 monthly injections. She demonstrated understanding and voiced agreement. ?  ?I have previously talked to the patient in detail about expectations, limitations, benefits as well as potential adverse effects of botulinum toxin injections. The patient understands that the side effects include (but are not limited to): Mouth dryness, dryness of eyes, speech and swallowing difficulties, respiratory depression or problems breathing, weakness of muscles including more distant muscles than the ones injected, flu-like symptoms, myalgias, injection site reactions such as redness, itching, swelling, pain, and infection. ?  ?200 units of botulinum toxin type A were reconstituted using preservative-free normal saline to a concentration of 10 units per 0.1 mL and drawn up in two 1 mL tuberculin syringes.  ? ?Botox- 200 units x 1 vial ?Lot:  [id number] ?Expiration: 05/2024 ?NDC: 4627-0350-09 ?  ? 0.9% Sodium Chloride- 17m total ?Lot: [id number]?Expiration: 07/06/2022 ?[place] [address]-696-78?  ?B/B     ? ?O/E: BP 119/76   Pulse 83   Ht '5\' 3"'$  (1.6 m)   Wt 192 lb 3.2 oz (87.2 kg)   BMI 34.05 kg/m?  ? ?   ?The patient was situated in a chair, sitting comfortably. After preparing the areas with 70% isopropyl alcohol and using a 26 gauge 1 1/2 inch hollow lumen recording EMG needle for the neck injections as well as a 30 gauge 1 inch needle for the facial injections, a total dose of 155 units of botulinum toxin type A in the form of Botox was injected into the muscles and the following distribution and quantities: ?  ?#1: 10 units on the right and 10 units in the left frontalis muscles, broken down in 2 sites on each side. ?#2: 5 units in the right and 5 units in the left corrugator muscles. ?#3: 15 units in the right and 15 units in the left occipitalis muscles, broken down in 3 sites on each side. ?#4: 20 units in the right and 20 units in the left temporalis muscles, broken down in 4 sites on each side. ?#5: 15 units on the right and 15 units in the left upper trapezius muscles, broken down in 3 sites on each side. ?#6: 10 units in the right and 10 units in the left splenius capitis muscles, broken down in 2 sites on each side. ?#7: 2.5 units in the right and 2.5 units in the left procerus muscles. ?  ?EMG guidance was utilized for the neck injections with mild EMG activity noted, especially in the  splenius capitis muscle on the L.   ?  ?A dose of 45 units out of a total dose of 200 units was discarded as unavoidable waste. ?  ?The patient tolerated the procedure well without immediate complications. She was advised to make a followup appointment for repeat injections in 3 months from now and encouraged to call us with any interim questions, concerns, problems, or updates. She was in agreement and did not have any questions prior to leaving clinic today. ?   ?Previously (copied from previous notes for reference):  ? ?She had her 14th injection on 04/13/21, at which time she reported doing well.  ?  ?She had her 13th injection on 01/11/2021, at which time she reported doing well.  She had experienced no side effects. ?  ?She called in the interim reporting breakthrough migraines.  Her primary care ordered a brain MRI.  She had a brain MRI without contrast on 03/12/2021 and I reviewed the results: IMPRESSION: ?No evidence of acute intracranial abnormality. ?  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 ?  ?Otherwise unremarkable non-contrast MRI appearance of the brain. ?  ?She had her 12th injection on 10/11/2020, at which time she reported doing well, latest injection had worked well. ?  ?She had her 10th injection on 03/31/2020, at which time she reported, she had done well. ?  ?She had an interim injection with [name] on 07/12/2020. ?  ?  ?She missed an injection in between and had her ninth injection with [name], nurse practitioner on 12/29/2019. ?  ?She had her 8th injection on 05/08/2019 at which time she reported that she had not taken the [name].  She was unsure if she had any refills.  She presented with a migraine.  She tolerated the Botox injection reasonably well.   ?  ?  ?I saw her on 01/30/2019 for her seventh injection, at which time she reported that the Botox injections were very effective and that the new medication [name] was more helpful for acute use than her triptan before. ?  ?I saw her on 10/24/2018 for her 6th injection, at which time she had noticed a recent flareup of her migraines.  She did not think that the [name] had helped that much.  She was a little overdue for her Botox injection at the time. ?  ?I saw her on 07/17/2018 for her fifth injection, at which time she reported Botox had been helpful, she reported overall very good results, no side effects, her migraines had  reduced by at least 7 days a month, she also reported reduction by at least 100 hours/month of her migraine headaches.  She did report that she had received cosmetic Botox injection in her eyebrow area. ?  ?I saw her on 04/10/2018 for her 4th injection, at which time she reported good results with her Botox injections in significant reduction in her headache frequency and severely overall. She denied any side effects. She was overdue by nearly 2 weeks for her injection at the time. ?  ?I saw her on 12/26/2017 for her 3rd injection, at which time she reported having had a recent increase in her headache frequency, she had recent foot surgery. ?  ?I saw her on 09/25/2017 for her second round of injections. She felt that in the week prior to the injection her headaches were more intense. ?  ?I saw her on 06/28/2017 for her initial Botox injection, at which time  she received 155 units of Botox. ?  ?She was seen by her primary care physician and received a Toradol injection on 06/19/2017 as well as a prescription for Relpax. ?  ?04/25/17: Dr. Melton Alar retired. She reports a long-standing history of migraine headaches, she recalls she was about 51 years old when she started having migraines. For quite some time she had spontaneous improvement in her migraines but some 4 or 5 years ago started having more frequent migraines. She has tried and failed multiple preventative medications but does not have a list with her and prior records from Dr. Audery Amel office are not available today. She reports that his office is closed at this point. She did not get her records from his office. Perhaps her primary care provider has records. I encouraged her to try to obtain those records as it will help [name] manage her migraines optimally. She recalls having tried Topamax. She had hair loss from some medications but is not completely sure which ones. Topamax did not help, Zonegran caused her sedation, nortriptyline was also tried but she is  unsure what happened with it. Amitriptyline she may have tried as well. She did not try Depakote. She is worried about sedation side effects as she also is going to school and has to be alert throughout the day. She

## 2021-10-06 NOTE — Patient Instructions (Signed)
Please remember, botulinum toxin takes about 3-7 days to kick in. As discussed, this is not a pain shot. The purpose of the injections is to gradually improve your symptoms. In some patients it takes up to 2-3 weeks to make a difference and it wears off with time. Sometimes it may wear off before it is time for the next injection. We still should wait till the next 3 monthly injection, because injecting too frequently may cause you to develop immunity to the botulinum toxin. We are looking for a reduction in the severity and/or frequency of your symptoms. As a reminder, side effects to look out for are (but not limited to): mouth dryness, dryness of the eyes, heaviness of your head or muscle weakness, including droopy face or droopy eyelid(s), rarely: speech or swallowing difficulties and very rarely: breathing difficulties. Some people have transient neck pain or soreness which typically responds to over-the-counter anti-inflammatory medication and local heat application with a heat pad. If you think you have a severe reaction to the botulinum toxin, such as weakness, trouble speaking, trouble breathing, or trouble swallowing, you have to call 911 or have someone take you to the nearest emergency room. However, most people have either no or minimal side effects from the injections. It is normal to have a little bit of redness and swelling around the injection sites which usually improves after a few hours. Rarely, there may be a bruise that improves on its own. Most side effects reported are very mild and resolve within 10-14 days. Please feel free to call us if you have any additional questions or concerns: [phone] or email us through My Chart. We may have to adjust the dose over time, depending on your results from this injection and your overall response over time to this medication.   

## 2021-10-13 DIAGNOSIS — H6122 Impacted cerumen, left ear: Secondary | ICD-10-CM | POA: Insufficient documentation

## 2021-10-13 DIAGNOSIS — R3129 Other microscopic hematuria: Secondary | ICD-10-CM | POA: Diagnosis not present

## 2021-10-13 DIAGNOSIS — R8290 Unspecified abnormal findings in urine: Secondary | ICD-10-CM | POA: Diagnosis not present

## 2021-10-13 DIAGNOSIS — N393 Stress incontinence (female) (male): Secondary | ICD-10-CM | POA: Diagnosis not present

## 2021-10-13 NOTE — Assessment & Plan Note (Signed)
Ear irrigated with no complications ? ?

## 2021-10-18 NOTE — Progress Notes (Signed)
Chief Complaint:   OBESITY Morgan Long is here to discuss her progress with her obesity treatment plan along with follow-up of her obesity related diagnoses. Morgan Long is on the Category 2 Plan and states she is following her eating plan approximately 70% of the time. Morgan Long states she is walking and lifting weights for 40 minutes 3 times per week.  Today's visit was #: 13 Starting weight: 206 lbs Starting date: 02/02/2021 Today's weight: 186 lbs Today's date: 10/03/2021 Total lbs lost to date: 20 Total lbs lost since last in-office visit: 0  Interim History: Morgan Long has done well with maintaining her weight. She is struggling with constipation but is still doing well overall.   Subjective:   1. Chronic constipation Morgan Long recently started Amitiza and has had a lot of gas. She did miralax previously with no benefit.   2. Pre-diabetes Morgan Long is on Victoza, she increased her dose to 1.2 mg and she notes decrease in hunger. This is likely contributing to her constipation.   3. At risk for impaired metabolic function Morgan Long is at increased risk for impaired metabolic function if calories decrease.  Assessment/Plan:   1. Chronic constipation Morgan Long agreed to start miralax in addition to Amitiza, and increase her water intake and will continue to monitor.  2. Pre-diabetes We will refill Victoza for 1 month. Morgan Long will continue to work on weight loss, exercise, and decreasing simple carbohydrates to help decrease the risk of diabetes.   - liraglutide (VICTOZA) 18 MG/3ML SOPN; Inject 0.6 mg into the skin daily.  Dispense: 6 mL; Refill: 0  3. At risk for impaired metabolic function Morgan Long was given approximately 15 minutes of impaired  metabolic function prevention counseling today. We discussed intensive lifestyle modifications today with an emphasis on specific nutrition and exercise instructions and strategies.   Repetitive spaced learning was employed today to elicit superior memory formation and  behavioral change.  4. Obesity, Current BMI 33.0 Morgan Long is currently in the action stage of change. As such, her goal is to continue with weight loss efforts. She has agreed to the Category 2 Plan.   Exercise goals: As is.   Behavioral modification strategies: increasing lean protein intake and increasing water intake.  Morgan Long has agreed to follow-up with our clinic in 3 weeks. She was informed of the importance of frequent follow-up visits to maximize her success with intensive lifestyle modifications for her multiple health conditions.   Objective:   Blood pressure 105/64, pulse 79, temperature 98.2 F (36.8 C), height '5\' 3"'$  (1.6 m), weight 186 lb (84.4 kg), SpO2 95 %. Body mass index is 32.95 kg/m.  General: Cooperative, alert, well developed, in no acute distress. HEENT: Conjunctivae and lids unremarkable. Cardiovascular: Regular rhythm.  Lungs: Normal work of breathing. Neurologic: No focal deficits.   Lab Results  Component Value Date   CREATININE 0.83 07/07/2021   BUN 13 07/07/2021   NA 139 07/07/2021   K 4.5 07/07/2021   CL 104 07/07/2021   CO2 22 07/07/2021   Lab Results  Component Value Date   ALT 18 07/07/2021   AST 18 07/07/2021   ALKPHOS 83 07/07/2021   BILITOT 0.2 07/07/2021   Lab Results  Component Value Date   HGBA1C 5.9 (H) 07/07/2021   HGBA1C 5.9 (H) 02/02/2021   HGBA1C 5.9 (H) 05/06/2018   HGBA1C 5.8 (H) 01/23/2018   HGBA1C 5.5 08/14/2017   Lab Results  Component Value Date   INSULIN 14.2 07/07/2021   INSULIN 12.8 02/02/2021  INSULIN 11.2 05/06/2018   INSULIN 8.4 01/23/2018   INSULIN 8.0 08/14/2017   Lab Results  Component Value Date   TSH 1.590 02/02/2021   Lab Results  Component Value Date   CHOL 195 07/07/2021   HDL 71 07/07/2021   LDLCALC 109 (H) 07/07/2021   TRIG 81 07/07/2021   CHOLHDL 2.7 07/07/2021   Lab Results  Component Value Date   VD25OH 60.7 07/07/2021   VD25OH 44.8 02/02/2021   VD25OH 35.92 11/02/2020   Lab  Results  Component Value Date   WBC 7.1 02/21/2021   HGB 12.8 07/27/2021   HCT 38.2 07/27/2021   MCV 91.0 02/21/2021   PLT 355.0 02/21/2021   Lab Results  Component Value Date   FERRITIN 169 05/21/2019   Attestation Statements:   Reviewed by clinician on day of visit: allergies, medications, problem list, medical history, surgical history, family history, social history, and previous encounter notes.   I, Trixie Dredge, am acting as transcriptionist for Dennard Nip, MD.  I have reviewed the above documentation for accuracy and completeness, and I agree with the above. -  Dennard Nip, MD

## 2021-10-21 ENCOUNTER — Encounter (INDEPENDENT_AMBULATORY_CARE_PROVIDER_SITE_OTHER): Payer: Self-pay | Admitting: Family Medicine

## 2021-10-21 DIAGNOSIS — M722 Plantar fascial fibromatosis: Secondary | ICD-10-CM | POA: Diagnosis not present

## 2021-10-24 NOTE — Telephone Encounter (Signed)
Dr.Beasley 

## 2021-11-01 ENCOUNTER — Encounter (INDEPENDENT_AMBULATORY_CARE_PROVIDER_SITE_OTHER): Payer: Self-pay | Admitting: Family Medicine

## 2021-11-01 ENCOUNTER — Other Ambulatory Visit (INDEPENDENT_AMBULATORY_CARE_PROVIDER_SITE_OTHER): Payer: Self-pay | Admitting: Family Medicine

## 2021-11-01 ENCOUNTER — Ambulatory Visit (INDEPENDENT_AMBULATORY_CARE_PROVIDER_SITE_OTHER): Payer: 59 | Admitting: Family Medicine

## 2021-11-01 VITALS — BP 126/70 | HR 90 | Temp 98.1°F | Ht 63.0 in | Wt 190.0 lb

## 2021-11-01 DIAGNOSIS — Z9189 Other specified personal risk factors, not elsewhere classified: Secondary | ICD-10-CM

## 2021-11-01 DIAGNOSIS — R7303 Prediabetes: Secondary | ICD-10-CM

## 2021-11-01 DIAGNOSIS — Z6833 Body mass index (BMI) 33.0-33.9, adult: Secondary | ICD-10-CM

## 2021-11-01 DIAGNOSIS — E669 Obesity, unspecified: Secondary | ICD-10-CM

## 2021-11-01 MED ORDER — SEMAGLUTIDE-WEIGHT MANAGEMENT 0.25 MG/0.5ML ~~LOC~~ SOAJ: 0.2500 mg | SUBCUTANEOUS | 1 refills | Status: DC

## 2021-11-02 ENCOUNTER — Other Ambulatory Visit (INDEPENDENT_AMBULATORY_CARE_PROVIDER_SITE_OTHER): Payer: Self-pay | Admitting: Family Medicine

## 2021-11-02 NOTE — Telephone Encounter (Signed)
Dr.Beasley 

## 2021-11-08 NOTE — Progress Notes (Signed)
Chief Complaint:   OBESITY Morgan Long is here to discuss her progress with her obesity treatment plan along with follow-up of her obesity related diagnoses. Morgan Long is on the Category 2 Plan and states she is following her eating plan approximately 50% of the time. Morgan Long states she is walking for 60 minutes 3 times per week.  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 She tried [name] and had constipation, and when she was on Ozempic she did not.  Subjective:   1. Pre-diabetes Morgan Long is working on her diet and exercise. She is hoping to start a GLP-1 soon.  2. At risk for heart disease Morgan Long is at higher than average risk for cardiovascular disease due to obesity.  Assessment/Plan:   1. Pre-diabetes Morgan Long will continue her diet, exercise, and decreasing simple carbohydrates to help decrease the risk of diabetes. We will follow up in 1 month.  2. At risk for heart disease Morgan Long was given approximately 15 minutes of coronary artery disease prevention counseling today. She is 51 y.o. female and has risk factors for heart disease including obesity. We discussed intensive lifestyle modifications today with an emphasis on specific weight loss instructions and strategies.  Repetitive spaced learning was employed today to elicit superior memory formation and behavioral change.   3. Obesity, Current BMI 33.7 Morgan Long is currently in the action stage of change. As such, her goal is to continue with weight loss efforts. She has agreed to the Category 2 Plan.   We discussed various medication options to help Morgan Long with her weight loss efforts and we both agreed to start Wegovy 0.25 mg q week. (Wait on prior authorization  until tomorrow with her new insurance).  - Semaglutide-Weight Management 0.25 MG/0.5ML SOAJ; Inject 0.25 mg into the skin once a week.  Dispense: 2 mL; Refill: 1  Exercise goals: As is.  Behavioral modification strategies: increasing lean protein intake and meal planning and cooking strategies.  [name] has agreed to follow-up with our clinic in 4 weeks. She was informed of the importance of frequent follow-up visits to maximize her success with intensive lifestyle modifications for her multiple health conditions.   Objective:   Blood pressure 126/70, pulse 90, temperature 98.1 F (36.7 C), height '5\' 3"'$  (1.6 m), weight 190 lb (86.2 kg), SpO2 98 %. Body mass index is 33.66 kg/m.  General: Cooperative, alert, well developed, in no acute distress. HEENT: Conjunctivae and lids unremarkable. Cardiovascular: Regular rhythm.  Lungs: Normal work of breathing. Neurologic: No focal deficits.   Lab Results  Component Value Date   CREATININE 0.83 07/07/2021   BUN 13 07/07/2021   NA 139 07/07/2021   K 4.5 07/07/2021   CL 104 07/07/2021   CO2 22 07/07/2021   Lab Results  Component Value Date   ALT 18 07/07/2021   AST 18 07/07/2021   ALKPHOS 83 07/07/2021   BILITOT 0.2 07/07/2021   Lab Results  Component Value Date   HGBA1C 5.9 (H) 07/07/2021   HGBA1C 5.9 (H) 02/02/2021   HGBA1C 5.9 (H) 05/06/2018   HGBA1C 5.8 (H) 01/23/2018   HGBA1C 5.5 08/14/2017   Lab Results  Component Value Date   INSULIN 14.2 07/07/2021   INSULIN 12.8 02/02/2021  INSULIN 11.2 05/06/2018   INSULIN 8.4 01/23/2018   INSULIN 8.0 08/14/2017   Lab Results  Component Value Date   TSH 1.590 02/02/2021   Lab Results  Component Value Date   CHOL 195 07/07/2021   HDL 71 07/07/2021   LDLCALC 109 (H) 07/07/2021   TRIG 81 07/07/2021   CHOLHDL 2.7 07/07/2021   Lab Results  Component Value Date   VD25OH 60.7 07/07/2021   VD25OH 44.8 02/02/2021   VD25OH 35.92 11/02/2020   Lab Results  Component Value  Date   WBC 7.1 02/21/2021   HGB 12.8 07/27/2021   HCT 38.2 07/27/2021   MCV 91.0 02/21/2021   PLT 355.0 02/21/2021   Lab Results  Component Value Date   FERRITIN 169 05/21/2019   Attestation Statements:   Reviewed by clinician on day of visit: allergies, medications, problem list, medical history, surgical history, family history, social history, and previous encounter notes.   I, Trixie Dredge, am acting as transcriptionist for Dennard Nip, MD.  I have reviewed the above documentation for accuracy and completeness, and I agree with the above. -  Dennard Nip, MD

## 2021-11-10 ENCOUNTER — Other Ambulatory Visit: Payer: Self-pay | Admitting: Family Medicine

## 2021-11-10 ENCOUNTER — Encounter: Payer: Self-pay | Admitting: Family Medicine

## 2021-11-10 MED ORDER — OZEMPIC (0.25 OR 0.5 MG/DOSE) 2 MG/3ML ~~LOC~~ SOPN: PEN_INJECTOR | SUBCUTANEOUS | 1 refills | Status: DC

## 2021-11-11 ENCOUNTER — Encounter (INDEPENDENT_AMBULATORY_CARE_PROVIDER_SITE_OTHER): Payer: Self-pay | Admitting: Family Medicine

## 2021-11-11 ENCOUNTER — Encounter: Payer: Self-pay | Admitting: Family Medicine

## 2021-11-14 ENCOUNTER — Other Ambulatory Visit: Payer: Self-pay | Admitting: Family Medicine

## 2021-11-14 ENCOUNTER — Telehealth: Payer: Self-pay

## 2021-11-16 ENCOUNTER — Telehealth: Payer: Self-pay | Admitting: *Deleted

## 2021-11-16 ENCOUNTER — Encounter (INDEPENDENT_AMBULATORY_CARE_PROVIDER_SITE_OTHER): Payer: Self-pay | Admitting: Family Medicine

## 2021-11-16 ENCOUNTER — Encounter: Payer: Self-pay | Admitting: Family Medicine

## 2021-11-16 NOTE — Telephone Encounter (Signed)
Prior auth started via cover my meds.  Awaiting determination.  Key: [id number]

## 2021-11-16 NOTE — Telephone Encounter (Signed)
It look like we did not prescribe Ozempic for this patient. Ozempic was prescribe on 11/10/21 by Roma Schanz. Please see chart. Thanks!

## 2021-11-17 ENCOUNTER — Other Ambulatory Visit: Payer: Self-pay | Admitting: Internal Medicine

## 2021-11-21 ENCOUNTER — Encounter (INDEPENDENT_AMBULATORY_CARE_PROVIDER_SITE_OTHER): Payer: Self-pay | Admitting: Family Medicine

## 2021-11-21 DIAGNOSIS — M722 Plantar fascial fibromatosis: Secondary | ICD-10-CM | POA: Diagnosis not present

## 2021-11-21 NOTE — Telephone Encounter (Signed)
FYI  The prior did come to Dr. Ivy Lynn office and we did the prior auth and her insurance denied the Ozempic because it is only approved for members with type 2 diabetes.

## 2021-11-21 NOTE — Telephone Encounter (Signed)
Dr.Beasley 

## 2021-11-21 NOTE — Telephone Encounter (Signed)
Prior auth denied.  "This medication is approved for members with type 2 diabetes (a condition with high blood sugars)."

## 2021-11-22 ENCOUNTER — Encounter (INDEPENDENT_AMBULATORY_CARE_PROVIDER_SITE_OTHER): Payer: Self-pay | Admitting: Family Medicine

## 2021-11-25 DIAGNOSIS — L700 Acne vulgaris: Secondary | ICD-10-CM | POA: Diagnosis not present

## 2021-11-25 DIAGNOSIS — L219 Seborrheic dermatitis, unspecified: Secondary | ICD-10-CM | POA: Diagnosis not present

## 2021-11-29 ENCOUNTER — Ambulatory Visit (INDEPENDENT_AMBULATORY_CARE_PROVIDER_SITE_OTHER): Payer: 59 | Admitting: Family Medicine

## 2021-11-29 DIAGNOSIS — N393 Stress incontinence (female) (male): Secondary | ICD-10-CM | POA: Diagnosis not present

## 2021-11-30 ENCOUNTER — Ambulatory Visit (INDEPENDENT_AMBULATORY_CARE_PROVIDER_SITE_OTHER): Payer: BC Managed Care – PPO | Admitting: Family Medicine

## 2021-12-07 DIAGNOSIS — M9901 Segmental and somatic dysfunction of cervical region: Secondary | ICD-10-CM | POA: Diagnosis not present

## 2021-12-07 DIAGNOSIS — M9903 Segmental and somatic dysfunction of lumbar region: Secondary | ICD-10-CM | POA: Diagnosis not present

## 2021-12-07 DIAGNOSIS — M9904 Segmental and somatic dysfunction of sacral region: Secondary | ICD-10-CM | POA: Diagnosis not present

## 2021-12-07 DIAGNOSIS — M5386 Other specified dorsopathies, lumbar region: Secondary | ICD-10-CM | POA: Diagnosis not present

## 2021-12-07 DIAGNOSIS — M9902 Segmental and somatic dysfunction of thoracic region: Secondary | ICD-10-CM | POA: Diagnosis not present

## 2021-12-07 DIAGNOSIS — M531 Cervicobrachial syndrome: Secondary | ICD-10-CM | POA: Diagnosis not present

## 2021-12-09 DIAGNOSIS — M5386 Other specified dorsopathies, lumbar region: Secondary | ICD-10-CM | POA: Diagnosis not present

## 2021-12-09 DIAGNOSIS — M9904 Segmental and somatic dysfunction of sacral region: Secondary | ICD-10-CM | POA: Diagnosis not present

## 2021-12-09 DIAGNOSIS — M9903 Segmental and somatic dysfunction of lumbar region: Secondary | ICD-10-CM | POA: Diagnosis not present

## 2021-12-09 DIAGNOSIS — M9902 Segmental and somatic dysfunction of thoracic region: Secondary | ICD-10-CM | POA: Diagnosis not present

## 2021-12-12 ENCOUNTER — Ambulatory Visit (INDEPENDENT_AMBULATORY_CARE_PROVIDER_SITE_OTHER): Payer: BC Managed Care – PPO | Admitting: Family Medicine

## 2021-12-14 ENCOUNTER — Telehealth: Payer: Self-pay | Admitting: *Deleted

## 2021-12-14 ENCOUNTER — Telehealth (INDEPENDENT_AMBULATORY_CARE_PROVIDER_SITE_OTHER): Payer: Self-pay | Admitting: Family Medicine

## 2021-12-14 ENCOUNTER — Encounter: Payer: Self-pay | Admitting: *Deleted

## 2021-12-14 DIAGNOSIS — M5386 Other specified dorsopathies, lumbar region: Secondary | ICD-10-CM | POA: Diagnosis not present

## 2021-12-14 DIAGNOSIS — H04123 Dry eye syndrome of bilateral lacrimal glands: Secondary | ICD-10-CM | POA: Diagnosis not present

## 2021-12-14 DIAGNOSIS — M9904 Segmental and somatic dysfunction of sacral region: Secondary | ICD-10-CM | POA: Diagnosis not present

## 2021-12-14 DIAGNOSIS — M9902 Segmental and somatic dysfunction of thoracic region: Secondary | ICD-10-CM | POA: Diagnosis not present

## 2021-12-14 DIAGNOSIS — M9903 Segmental and somatic dysfunction of lumbar region: Secondary | ICD-10-CM | POA: Diagnosis not present

## 2021-12-14 NOTE — Telephone Encounter (Signed)
Pt called requesting information regarding options for reduced dose and paying out of pocket for semaglutide or other medications for weight loss. Pt states other places are able to provide a different dose and have it available to patients.  Please call pt to discuss what options are 'available at this clinic.' Call pt at [phone].

## 2021-12-14 NOTE — Telephone Encounter (Signed)
Completed [name] PA on Cover My Meds. Key: [id number]. Awaiting determination from Cecilia.

## 2021-12-14 NOTE — Telephone Encounter (Signed)
Approved today Effective from 12/14/2021 through 03/07/2022.

## 2021-12-15 ENCOUNTER — Encounter (INDEPENDENT_AMBULATORY_CARE_PROVIDER_SITE_OTHER): Payer: Self-pay | Admitting: *Deleted

## 2021-12-15 NOTE — Telephone Encounter (Signed)
PA was already completed and approved. Pt aware.

## 2021-12-15 NOTE — Telephone Encounter (Signed)
Patient was sent Mychart message. Informed patient this would be further discussed with her at her office visit next week with Dr. Leafy Ro.

## 2021-12-16 DIAGNOSIS — M9902 Segmental and somatic dysfunction of thoracic region: Secondary | ICD-10-CM | POA: Diagnosis not present

## 2021-12-16 DIAGNOSIS — H1089 Other conjunctivitis: Secondary | ICD-10-CM | POA: Diagnosis not present

## 2021-12-16 DIAGNOSIS — M5386 Other specified dorsopathies, lumbar region: Secondary | ICD-10-CM | POA: Diagnosis not present

## 2021-12-16 DIAGNOSIS — M9903 Segmental and somatic dysfunction of lumbar region: Secondary | ICD-10-CM | POA: Diagnosis not present

## 2021-12-16 DIAGNOSIS — N952 Postmenopausal atrophic vaginitis: Secondary | ICD-10-CM | POA: Diagnosis not present

## 2021-12-16 DIAGNOSIS — H2513 Age-related nuclear cataract, bilateral: Secondary | ICD-10-CM | POA: Diagnosis not present

## 2021-12-16 DIAGNOSIS — H04123 Dry eye syndrome of bilateral lacrimal glands: Secondary | ICD-10-CM | POA: Diagnosis not present

## 2021-12-16 DIAGNOSIS — N393 Stress incontinence (female) (male): Secondary | ICD-10-CM | POA: Diagnosis not present

## 2021-12-16 DIAGNOSIS — H43393 Other vitreous opacities, bilateral: Secondary | ICD-10-CM | POA: Diagnosis not present

## 2021-12-16 DIAGNOSIS — M9904 Segmental and somatic dysfunction of sacral region: Secondary | ICD-10-CM | POA: Diagnosis not present

## 2021-12-20 ENCOUNTER — Ambulatory Visit (INDEPENDENT_AMBULATORY_CARE_PROVIDER_SITE_OTHER): Payer: BC Managed Care – PPO | Admitting: Family Medicine

## 2021-12-20 ENCOUNTER — Encounter (INDEPENDENT_AMBULATORY_CARE_PROVIDER_SITE_OTHER): Payer: Self-pay | Admitting: Family Medicine

## 2021-12-20 VITALS — BP 106/69 | HR 92 | Temp 98.4°F | Ht 63.0 in | Wt 201.0 lb

## 2021-12-20 DIAGNOSIS — Z6835 Body mass index (BMI) 35.0-35.9, adult: Secondary | ICD-10-CM

## 2021-12-20 DIAGNOSIS — E669 Obesity, unspecified: Secondary | ICD-10-CM

## 2021-12-20 DIAGNOSIS — R7303 Prediabetes: Secondary | ICD-10-CM

## 2021-12-20 MED ORDER — SEMAGLUTIDE-WEIGHT MANAGEMENT 0.25 MG/0.5ML ~~LOC~~ SOAJ: 0.2500 mg | SUBCUTANEOUS | 0 refills | Status: DC

## 2021-12-26 DIAGNOSIS — M9904 Segmental and somatic dysfunction of sacral region: Secondary | ICD-10-CM | POA: Diagnosis not present

## 2021-12-26 DIAGNOSIS — M9903 Segmental and somatic dysfunction of lumbar region: Secondary | ICD-10-CM | POA: Diagnosis not present

## 2021-12-26 DIAGNOSIS — M5386 Other specified dorsopathies, lumbar region: Secondary | ICD-10-CM | POA: Diagnosis not present

## 2021-12-26 DIAGNOSIS — M9902 Segmental and somatic dysfunction of thoracic region: Secondary | ICD-10-CM | POA: Diagnosis not present

## 2021-12-26 NOTE — Progress Notes (Unsigned)
Cardiology Office Note:    Date:  12/27/2021   ID:  Morgan Long, DOB 1971-03-25, MRN [id number]  PCP:  Carollee Herter, Alferd Apa, DO  Cardiologist:  [name] More, MD    Referring MD: Carollee Herter, Alferd Apa, *    ASSESSMENT:    1. PVC (premature ventricular contraction)   2. COVID    PLAN:    In order of problems listed above:  Improved initially had symptomatic PVCs beta-blocker intolerant having very little palpitation I encouraged her to purchase a smart watch [employer] and she can capture episodes that if concern push them to me through MyChart to review I do not think she needs a repeat monitor Improvement   Next appointment: I will see back in the office as needed   Medication Adjustments/Labs and Tests Ordered: Current medicines are reviewed at length with the patient today.  Concerns regarding medicines are outlined above.  No orders of the defined types were placed in this encounter.  No orders of the defined types were placed in this encounter.   Chief Complaint  Patient presents with   Follow-up   Palpitations  She had a cardiac MRI performed  History of Present Illness:    Morgan Long is a 51 y.o. female with a hx of  symptomatic PVCs intolerant of beta-blockers in the setting of postacute COVID-19 infection and ongoing shortness of breath.  She has had echocardiograms most recently [facility] 02/23/2020 showing structurally normal heart.  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 She was last seen 06/28/2021.  With ongoing symptoms following COVID-19 infection with palpitation and shortness of breath she underwent a cardiac MRI 07/28/2021.  Both left and right ventricular function were normal there was no LGE and T1 indices were normal.  There was a normal cardiac MR.  Compliance with diet, lifestyle and medications: Yes  She is thoroughly improved much  worse palpitation 1 day with physical activity her pulse same rapid resolved with rest I encouraged her to purchase the [employer] watch to capture events and she can send them to me through [place]. We are both reassured by the results of her cardiac MR.  She has had no structural injury from her COVID infection No edema shortness of breath chest pain Past Medical History:  Diagnosis Date   Allergic rhinitis    Asthma    B12 deficiency    Chronic migraine    Dr. Catalina Gravel   Constipation    Dry eye syndrome of both lacrimal glands    Dry skin    Endometriosis    Fatigue    Gastroparesis    GERD (gastroesophageal reflux disease)    Headache(784.0)    occasional, dx w/ Migraines before, Topamax helps   Heartburn    Hepatic steatosis    History of stomach ulcers    Lactose intolerance    Localized edema    Migraine    Muscle weakness (generalized)    Nuclear cataract of both eyes    Mild   Overweight    Pain in right ankle and joints of right foot    Prediabetes    Shortness of breath    Sinus complaint    Sinusitis    Tired    TMJ pain dysfunction syndrome    occasional   Vitamin D deficiency     Past Surgical History:  Procedure Laterality Date   ANKLE SURGERY Left 11/16/2017  AUGMENTATION MAMMAPLASTY Bilateral 2006   BREAST ENHANCEMENT SURGERY  2006   BUNIONECTOMY     COLONOSCOPY     endrometroisis     fallopian tube removed     Left   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   UPPER GASTROINTESTINAL ENDOSCOPY     [place] teeth extraction      Current Medications: Current Meds  Medication Sig   albuterol (VENTOLIN HFA) 108 (90 Base) MCG/ACT inhaler UP TO 2 PUFFS EVERY 4 HOURS AS NEEDED   azelastine (OPTIVAR) 0.05 % ophthalmic solution Place 1 drop into both eyes 2 (two) times daily.   cetirizine (ZYRTEC) 10 MG tablet TAKE 1 TABLET BY MOUTH DAILY   clindamycin (CLEOCIN T) 1 % external solution Apply topically 2 (two) times daily.    cyanocobalamin (,VITAMIN B-12,) 1000 MCG/ML injection INJECT 1 ML (1,000 MCG TOTAL) INTO THE MUSCLE ONCE FOR 1 DOSE.   ESTROGEL 0.75 MG/1.25 GM (0.06%) topical gel Apply 1 application topically daily.   etonogestrel-ethinyl estradiol (NUVARING) 0.12-0.015 MG/24HR vaginal ring Place 1 each vaginally every 28 (twenty-eight) days. Insert vaginally and leave in place for 3 consecutive weeks, then remove for 1 week.   Multiple Vitamin (MULTIVITAMIN) LIQD Take 5 mLs by mouth daily.   PREVIDENT 5000 SENSITIVE 1.1-5 % PSTE USE BEFORE BED DO NOT RINSE   [name] 50 MG TABS 1 PILL AT ONSET OF MIGRAINE, MAY REPEAT IN 2 HOURS, NO MORE THAN 2 PILLS IN 24 HOURS.).   Vitamin D, Ergocalciferol, (DRISDOL) 1.25 MG (50000 UNIT) CAPS capsule TAKE 1 CAPSULE (50,000 UNITS TOTAL) BY MOUTH EVERY 7 (SEVEN) DAYS     Allergies:   Acetaminophen-codeine, Hydrocodone, Oxycodone, Tyloxapol, and Codeine   Social History   Socioeconomic History   Marital status: Single    Spouse name: Not on file   Number of children: 0   Years of education: BS   Highest education level: Not on file  Occupational History   Occupation: Paramedic, going to school    Employer: [employer]   Occupation: Air traffic controller  Tobacco Use   Smoking status: Never    Passive exposure: Never   Smokeless tobacco: Never  Vaping Use   Vaping Use: Never used  Substance and Sexual Activity   Alcohol use: Not Currently    Comment: socially - occasional    Drug use: No   Sexual activity: Yes    Birth control/protection: Inserts    Comment: nuvaring  Other Topics Concern   Not on file  Social History Narrative   Household:sister and her 3 kids    Drinks occasional starbucks drink       Social Determinants of Health   Financial Resource Strain: Not on file  Food Insecurity: Not on file  Transportation Needs: Not on file  Physical Activity: Not on file  Stress: Not on file  Social Connections: Not on file     Family  History: The patient's family history includes Alcoholism in her mother; Asthma in an other family member; Breast cancer in her maternal aunt, maternal aunt, maternal aunt, and maternal aunt; Cancer in her father and mother; Cancer - Other (age of onset: 7) in her mother; Colon cancer in her paternal grandfather; Colon cancer (age of onset: 36) in her maternal uncle; Diabetes in an other family member; Heart disease in her father; Hypertension in her maternal aunt; Pancreatic cancer in her maternal aunt; Stroke in her father; Throat cancer  in her maternal uncle and mother. There is no history of Heart attack, Rectal cancer, or Stomach cancer. ROS:   Please see the history of present illness.    All other systems reviewed and are negative.  EKGs/Labs/Other Studies Reviewed:    The following studies were reviewed today: She has had a more recent echocardiogram structurally normal heart 01/06/2022  1. Left ventricular ejection fraction, by estimation, is 60 to 65%. The  left ventricle has normal function. The left ventricle has no regional  wall motion abnormalities. Left ventricular diastolic parameters were  normal.   2. Right ventricular systolic function is normal. The right ventricular  size is normal. There is normal pulmonary artery systolic pressure.   3. The mitral valve is normal in structure. Trivial mitral valve  regurgitation. No evidence of mitral stenosis.   4. The aortic valve is normal in structure. Aortic valve regurgitation is  not visualized. Mild aortic valve sclerosis is present, with no evidence  of aortic valve stenosis.   5. The inferior vena cava is normal in size with greater than 50%  respiratory variability, suggesting right atrial pressure of 3 mmHg.      Recent Labs: 02/02/2021: TSH 1.590 02/21/2021: Platelets 355.0 07/07/2021: ALT 18; BUN 13; Creatinine, Ser 0.83; Potassium 4.5; Sodium 139 07/27/2021: Hemoglobin 12.8  Recent Lipid Panel    Component Value  Date/Time   CHOL 195 07/07/2021 1059   TRIG 81 07/07/2021 1059   HDL 71 07/07/2021 1059   CHOLHDL 2.7 07/07/2021 1059   CHOLHDL 3 05/14/2014 1223   VLDL 12.2 05/14/2014 1223   LDLCALC 109 (H) 07/07/2021 1059    Physical Exam:    VS:  BP 110/60 (BP Location: Right Arm, Patient Position: Sitting, Cuff Size: Normal)   Pulse 88   Ht '5\' 3"'$  (1.6 m)   Wt 206 lb (93.4 kg)   SpO2 92%   BMI 36.49 kg/m     Wt Readings from Last 3 Encounters:  12/27/21 206 lb (93.4 kg)  12/20/21 201 lb (91.2 kg)  11/01/21 190 lb (86.2 kg)     GEN:  Well nourished, well developed in no acute distress HEENT: Normal NECK: No JVD; No carotid bruits LYMPHATICS: No lymphadenopathy CARDIAC: RRR, no murmurs, rubs, gallops RESPIRATORY:  Clear to auscultation without rales, wheezing or rhonchi  ABDOMEN: Soft, non-tender, non-distended MUSCULOSKELETAL:  No edema; No deformity  SKIN: Warm and dry NEUROLOGIC:  Alert and oriented x 3 PSYCHIATRIC:  Normal affect    Signed, [name] More, MD  12/27/2021 1:22 PM    [facility]

## 2021-12-26 NOTE — Progress Notes (Signed)
Chief Complaint:   OBESITY Morgan Long is here to discuss her progress with her obesity treatment plan along with follow-up of her obesity related diagnoses. Morgan Long is on the Category 2 Plan and states she is following her eating plan approximately 70% of the time. Morgan Long states she is doing fast walking for 60 minutes 3 times per week.  Today's visit was #: 15 Starting weight: 206 lbs Starting date: 02/02/2021 Today's weight: 201 lbs Today's date: 12/20/2021 Total lbs lost to date: 5 Total lbs lost since last in-office visit: 0  Interim History: Morgan Long continues to struggle with her weight loss.  She was on semaglutide in the past but her insurance would not cover as Ozempic.  Subjective:   1. Pre-diabetes Morgan Long was unable to tolerate Victoza due to severe constipation.  She did not get this previously on Ozempic, but insurance will no longer cover this for her.  Assessment/Plan:   1. Pre-diabetes Morgan Long will try [facility] for weight loss and treatment of pre-diabetes, and she will continue with her diet and exercise.  2. Obesity, Current BMI 35.6 Morgan Long is currently in the action stage of change. As such, her goal is to continue with weight loss efforts. She has agreed to the Category 2 Plan.   We discussed various medication options to help Morgan Long with her weight loss efforts and we both agreed to start Wegovy 0.25 mg once weekly, with no refills.  - Semaglutide-Weight Management 0.25 MG/0.5ML SOAJ; Inject 0.25 mg into the skin once a week for 28 days.  Dispense: 3 mL; Refill: 0  Exercise goals: As is.   Behavioral modification strategies: increasing lean protein intake, increasing vegetables, and increasing water intake.  Morgan Long has agreed to follow-up with our clinic in 4 weeks. She was informed of the importance of frequent follow-up visits to maximize her success with intensive lifestyle modifications for her multiple health conditions.   Objective:   Blood pressure 106/69, pulse 92,  temperature 98.4 F (36.9 C), height '5\' 3"'$  (1.6 m), weight 201 lb (91.2 kg), SpO2 97 %. Body mass index is 35.61 kg/m.  General: Cooperative, alert, well developed, in no acute distress. HEENT: Conjunctivae and lids unremarkable. Cardiovascular: Regular rhythm.  Lungs: Normal work of breathing. Neurologic: No focal deficits.   Lab Results  Component Value Date   CREATININE 0.83 07/07/2021   BUN 13 07/07/2021   NA 139 07/07/2021   K 4.5 07/07/2021   CL 104 07/07/2021   CO2 22 07/07/2021   Lab Results  Component Value Date   ALT 18 07/07/2021   AST 18 07/07/2021   ALKPHOS 83 07/07/2021   BILITOT 0.2 07/07/2021   Lab Results  Component Value Date   HGBA1C 5.9 (H) 07/07/2021   HGBA1C 5.9 (H) 02/02/2021   HGBA1C 5.9 (H) 05/06/2018   HGBA1C 5.8 (H) 01/23/2018   HGBA1C 5.5 08/14/2017   Lab Results  Component Value Date   INSULIN 14.2 07/07/2021   INSULIN 12.8 02/02/2021   INSULIN 11.2 05/06/2018   INSULIN 8.4 01/23/2018   INSULIN 8.0 08/14/2017   Lab Results  Component Value Date   TSH 1.590 02/02/2021   Lab Results  Component Value Date   CHOL 195 07/07/2021   HDL 71 07/07/2021   LDLCALC 109 (H) 07/07/2021   TRIG 81 07/07/2021   CHOLHDL 2.7 07/07/2021   Lab Results  Component Value Date   VD25OH 60.7 07/07/2021   VD25OH 44.8 02/02/2021   VD25OH 35.92 11/02/2020   Lab Results  Component Value Date   WBC 7.1 02/21/2021   HGB 12.8 07/27/2021   HCT 38.2 07/27/2021   MCV 91.0 02/21/2021   PLT 355.0 02/21/2021   Lab Results  Component Value Date   FERRITIN 169 05/21/2019   Attestation Statements:   Reviewed by clinician on day of visit: allergies, medications, problem list, medical history, surgical history, family history, social history, and previous encounter notes.  Time spent on visit including pre-visit chart review and post-visit care and charting was 41 minutes.   I, Trixie Dredge, am acting as transcriptionist for Dennard Nip, MD.  I  have reviewed the above documentation for accuracy and completeness, and I agree with the above. -  Dennard Nip, MD

## 2021-12-27 ENCOUNTER — Encounter (INDEPENDENT_AMBULATORY_CARE_PROVIDER_SITE_OTHER): Payer: Self-pay | Admitting: Family Medicine

## 2021-12-27 ENCOUNTER — Encounter: Payer: Self-pay | Admitting: Cardiology

## 2021-12-27 ENCOUNTER — Ambulatory Visit (INDEPENDENT_AMBULATORY_CARE_PROVIDER_SITE_OTHER): Payer: Self-pay | Admitting: Cardiology

## 2021-12-27 VITALS — BP 110/60 | HR 88 | Ht 63.0 in | Wt 206.0 lb

## 2021-12-27 DIAGNOSIS — U071 COVID-19: Secondary | ICD-10-CM

## 2021-12-27 DIAGNOSIS — I493 Ventricular premature depolarization: Secondary | ICD-10-CM

## 2021-12-27 NOTE — Patient Instructions (Signed)
Medication Instructions:  Your physician recommends that you continue on your current medications as directed. Please refer to the Current Medication list given to you today.  *If you need a refill on your cardiac medications before your next appointment, please call your pharmacy*   Lab Work: None If you have labs (blood work) drawn today and your tests are completely normal, you will receive your results only by: [place] (if you have MyChart) OR A paper copy in the mail If you have any lab test that is abnormal or we need to change your treatment, we will call you to review the results.   Testing/Procedures: None   Follow-Up: At [facility], you and your health needs are our priority.  As part of our continuing mission to provide you with exceptional heart care, we have created designated Provider Care Teams.  These Care Teams include your primary Cardiologist (physician) and Advanced Practice Providers (APPs -  Physician Assistants and Nurse Practitioners) who all work together to provide you with the care you need, when you need it.  We recommend signing up for the patient portal called "MyChart".  Sign up information is provided on this After Visit Summary.  MyChart is used to connect with patients for Virtual Visits (Telemedicine).  Patients are able to view lab/test results, encounter notes, upcoming appointments, etc.  Non-urgent messages can be sent to your provider as well.   To learn more about what you can do with MyChart, go to [email or web address].    Your next appointment:   Follow up as needed  The format for your next appointment:   In Person  Provider:   [name] More, MD{   Other Instructions Purchase a [place]

## 2021-12-28 ENCOUNTER — Encounter (INDEPENDENT_AMBULATORY_CARE_PROVIDER_SITE_OTHER): Payer: Self-pay | Admitting: Family Medicine

## 2021-12-28 DIAGNOSIS — Z6835 Body mass index (BMI) 35.0-35.9, adult: Secondary | ICD-10-CM

## 2021-12-28 DIAGNOSIS — R7303 Prediabetes: Secondary | ICD-10-CM

## 2021-12-28 NOTE — Telephone Encounter (Signed)
Please see previous encounter, this was address. Multiple messages from patient.

## 2021-12-29 DIAGNOSIS — M9903 Segmental and somatic dysfunction of lumbar region: Secondary | ICD-10-CM | POA: Diagnosis not present

## 2021-12-29 DIAGNOSIS — M9902 Segmental and somatic dysfunction of thoracic region: Secondary | ICD-10-CM | POA: Diagnosis not present

## 2021-12-29 DIAGNOSIS — M9904 Segmental and somatic dysfunction of sacral region: Secondary | ICD-10-CM | POA: Diagnosis not present

## 2021-12-29 DIAGNOSIS — M5386 Other specified dorsopathies, lumbar region: Secondary | ICD-10-CM | POA: Diagnosis not present

## 2021-12-29 MED ORDER — WEGOVY 1.7 MG/0.75ML ~~LOC~~ SOAJ: 1.7000 mg | SUBCUTANEOUS | 0 refills | Status: DC

## 2021-12-29 NOTE — Telephone Encounter (Signed)
Please send in x 1, I didn't get her last message sent 2 days ago.

## 2022-01-03 ENCOUNTER — Ambulatory Visit (INDEPENDENT_AMBULATORY_CARE_PROVIDER_SITE_OTHER): Payer: BC Managed Care – PPO | Admitting: Neurology

## 2022-01-03 ENCOUNTER — Encounter: Payer: Self-pay | Admitting: Neurology

## 2022-01-03 VITALS — BP 130/72 | HR 85

## 2022-01-03 DIAGNOSIS — G43719 Chronic migraine without aura, intractable, without status migrainosus: Secondary | ICD-10-CM | POA: Diagnosis not present

## 2022-01-03 MED ORDER — ONABOTULINUMTOXINA 200 UNITS IJ SOLR
155.0000 [IU] | Freq: Once | INTRAMUSCULAR | Status: AC
  Administered 2022-01-03: 155 [IU] via INTRAMUSCULAR

## 2022-01-03 MED ORDER — ONABOTULINUMTOXINA 200 UNITS IJ SOLR: 200.0000 [IU] | Freq: Once | INTRAMUSCULAR | Status: DC

## 2022-01-03 MED ORDER — UBRELVY 50 MG PO TABS
ORAL_TABLET | ORAL | 3 refills | Status: DC
Start: 2022-01-03 — End: 2022-10-24

## 2022-01-03 NOTE — Progress Notes (Signed)
Morgan Long is a 51 year old right-handed woman with an underlying medical history of allergic rhinitis, endometriosis, TMJ problems, Reflux disease, gastroparesis, hepatic congestion with steatosis, s/p partial hepatectomy for hepatic tumor, lactose intolerance, neck pain, and obesity, who presents for her 17th Botox injection for her intractable migraines.  The patient is unaccompanied today.  She had her 16th injection on 10/06/2021, at which time she reported doing well with good results with repeat Botox injections.  She had no side effects.  Today, 01/03/2022: She reports doing okay, no side effects from the Botox, she does report having received about 50 units of Botox injections into her jaw for TMJ on 12/21/2021 and in June she also received Botox cosmetic, unclear of the dose.  She is advised that any Botox injection in close proximity is additive/cumulative and may increase her risk for side effects but we mutually agreed to proceed with her standard migraine injections today.  Written informed consent for recurrent, 3 monthly intramuscular injections with botulinum toxin for this indication has previously been obtained and scanned into the patient's electronic chart. I will re-consent if the type of botulinum toxin used or the indication for injection changes for this patient in the future. The patient is informed that we will use the same consent for her recurrent, most likely 3 monthly injections. She demonstrated understanding and voiced agreement.   I have previously talked to the patient in detail about expectations, limitations, benefits as well as potential adverse effects of botulinum toxin injections. The patient understands that the side effects include (but are not limited to): Mouth dryness, dryness of eyes, speech and swallowing difficulties, respiratory depression or problems breathing, weakness of muscles including more distant muscles than the ones injected, flu-like symptoms, myalgias,  injection site reactions such as redness, itching, swelling, pain, and infection.   200 units of botulinum toxin type A were reconstituted using preservative-free normal saline to a concentration of 10 units per 0.1 mL and drawn up in two 1 mL tuberculin syringes.    Botox- 200 units x 1 vial Lot: [id number] Expiration: 07/2024 NDC: 2119-4174-08  Bacteriostatic 0.9% Sodium Chloride- 2 mL total Lot: [id number] Expiration: 01/04/2023 NDC: 5631-4970-26   B/B  O/E: BP 130/72   Pulse 85     The patient was situated in a chair, sitting comfortably. After preparing the areas with 70% isopropyl alcohol and using a 26 gauge 1 1/2 inch hollow lumen recording EMG needle for the neck injections as well as a 30 gauge 1 inch needle for the facial injections, a total dose of 155 units of botulinum toxin type A in the form of Botox was injected into the muscles and the following distribution and quantities:   #1: 10 units on the right and 10 units in the left frontalis muscles, broken down in 2 sites on each side. #2: 5 units in the right and 5 units in the left corrugator muscles. #3: 15 units in the right and 15 units in the left occipitalis muscles, broken down in 3 sites on each side. #4: 20 units in the right and 20 units in the left temporalis muscles, broken down in 4 sites on each side. #5: 15 units on the right and 15 units in the left upper trapezius muscles, broken down in 3 sites on each side. #6: 10 units in the right and 10 units in the left splenius capitis muscles, broken down in 2 sites on each side. #7: 2.5 units in the right and 2.5 units in the  left procerus muscles.   EMG guidance was utilized for the neck injections with mild EMG activity noted, especially in the splenius capitis muscle on the L.     A dose of 45 units out of a total dose of 200 units was discarded as unavoidable waste.   The patient tolerated the procedure well without immediate complications. She was advised to  make a followup appointment for repeat injections in 3 months from now and encouraged to call us with any interim questions, concerns, problems, or updates. She was in agreement and did not have any questions prior to leaving clinic today.   Previously (copied from previous notes for reference):   She had her 15th injection on 07/13/21 and reporting doing well.    She had her 14th injection on 04/13/21, at which time she reported doing well.    She had her 13th injection on 01/11/2021, at which time she reported doing well.  She had experienced no side effects.   She called in the interim reporting breakthrough migraines.  Her primary care ordered a brain MRI.  She had a brain MRI without contrast on 03/12/2021 and I reviewed the results: IMPRESSION: No evidence of acute intracranial abnormality.   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   Otherwise unremarkable non-contrast MRI appearance of the brain.   She had her 12th injection on 10/11/2020, at which time she reported doing well, latest injection had worked well.   She had her 10th injection on 03/31/2020, at which time she reported, she had done well.   She had an interim injection with Morgan Long on 07/12/2020.     She missed an injection in between and had her ninth injection with Morgan Long, nurse practitioner on 12/29/2019.   She had her 8th injection on 05/08/2019 at which time she reported that she had not taken the [name].  She was unsure if she had any refills.  She presented with a migraine.  She tolerated the Botox injection reasonably well.       I saw her on 01/30/2019 for her seventh injection, at which time she reported that the Botox injections were very effective and that the new medication [name] was more helpful for acute use than her triptan before.   I saw her on 10/24/2018 for her 6th injection, at which time she had noticed a recent flareup of her  migraines.  She did not think that the [name] had helped that much.  She was a little overdue for her Botox injection at the time.   I saw her on 07/17/2018 for her fifth injection, at which time she reported Botox had been helpful, she reported overall very good results, no side effects, her migraines had reduced by at least 7 days a month, she also reported reduction by at least 100 hours/month of her migraine headaches.  She did report that she had received cosmetic Botox injection in her eyebrow area.   I saw her on 04/10/2018 for her 4th injection, at which time she reported good results with her Botox injections in significant reduction in her headache frequency and severely overall. She denied any side effects. She was overdue by nearly 2 weeks for her injection at the time.   I saw her on 12/26/2017 for her 3rd injection, at which time she reported having had a recent increase in her headache frequency, she had recent foot surgery.   I saw her on 09/25/2017 for  her second round of injections. She felt that in the week prior to the injection her headaches were more intense.   I saw her on 06/28/2017 for her initial Botox injection, at which time she received 155 units of Botox.   She was seen by her primary care physician and received a Toradol injection on 06/19/2017 as well as a prescription for Relpax.   04/25/17: Dr. Melton Alar retired. She reports a long-standing history of migraine headaches, she recalls she was about 51 years old when she started having migraines. For quite some time she had spontaneous improvement in her migraines but some 4 or 5 years ago started having more frequent migraines. She has tried and failed multiple preventative medications but does not have a list with her and prior records from Dr. Audery Amel office are not available today. She reports that his office is closed at this point. She did not get her records from his office. Perhaps her primary care provider has  records. I encouraged her to try to obtain those records as it will help [name] manage her migraines optimally. She recalls having tried Topamax. She had hair loss from some medications but is not completely sure which ones. Topamax did not help, Zonegran caused her sedation, nortriptyline was also tried but she is unsure what happened with it. Amitriptyline she may have tried as well. She did not try Depakote. She is worried about sedation side effects as she also is going to school and has to be alert throughout the day. She takes occasional nausea medicine which likely is Zofran under the tongue. She has tried Maxalt under the tongue which is helpful at times. She needs a refill on that. She has regular eye exams because of her history of dry eyes. She used to wear a single contact but was advised to stop using it. She has had 2 rounds of Botox injections, last time about 3 months ago as she recalls. She remembers doing okay with it. Prior to starting Botox injections she reports having about 18-20 headache days per month typically. She does not always have a typical aura but sometimes has visual blurriness and bringing in her ears to warn her about an upcoming migraine headache. She has occasional nausea that precedes the headache.   I saw her on 06/09/2015 at which time she was referred is a new patient referral for a new problem, referred by her optometrist at the time for 1 month history of blurry vision. Her exam at the time was nonfocal and reassuringly she had no significant eye related findings. I suggested further workup in the form of visual evoked potentials, blood work, and she also reported a 6 month history of feeling tired. She had no one-sided weakness, tingling or numbness. Symptoms from the past which included paresthesias had resolved completely. She was under the care of Dr. Melton Alar for migraines. She was in the process of titrating Zonegran. She was on 225 mg each night. When she took 300 mg  each night she felt too sleepy during the day. She denied any symptoms of sleep disordered breathing. She did admit not being a good sleeper. She did endorse stress as she was in school online for psychology and was also working off-and-on in Personal assistant. She reported not drinking sodas, not drinking alcohol and she reported not smoking. She did report pain with eye movements and dry eyes. She reported no family history of multiple sclerosis or lupus. She denied joint pain with the exception of bilateral  knee pain and she also reported a 50 pound weight gain in the last year. She had visual evoked potentials on 07/06/2015: Impression: The visual evoked response test above was within normal limits bilaterally. No evidence of conduction slowing was seen within the anterior visual pathways on either side on today's evaluation.   We called her with her test results. Labs from 06/09/2015 showed normal A1c, normal vitamin D level, normal ANA, normal RF, normal ESR. CRP was elevated at 15.6. 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   She had a brain MRI with and without contrast on 06/23/2015: IMPRESSION:  This is a normal MRI of the brain with and without contrast In addition, personally reviewed the images through the PACS system. We called her with her test results.   I first met her on 05/22/2014 at the request of her neurosurgeon, Dr. Kathyrn Sheriff, at which time the patient reported intermittent right arm numbness, particularly with neck position changes. I suggested blood work and EMG and nerve conduction testing of the right upper extremity. Her blood work showed elevated B12 and B6 levels, indicative of B vitamin supplementation. Hemoglobin A1c was 5.7. We called her with her test results. She had EMG and nerve conduction testing on 06/02/2014:  IMPRESSION:   Nerve conduction studies done on both upper extremities were unremarkable, without evidence of a neuropathy seen. EMG evaluation of the right upper extremity was unremarkable, without evidence of an overlying cervical radiculopathy. We called her with her test results. At the time, she reported improved symptoms.   05/22/2014: She has intermittent right arm numbness. Her symptoms have been going on for about 6 months. She has not noted any permanent numbness and no issues elsewhere. She does not have any significant weakness and sometimes feels weak when the numbness seems to come on. It goes away if she changes positions or adjusts her neck position. She has had right shoulder problems and pain in the right shoulder.   You saw her on 05/14/2014 for neck pain. She had undergone physical therapy without improvement of her neck pain. She had cervical epidural steroid injections which helped for about 24 hours as understand.    She had a C-spine MRI without contrast on 01/07/2014: Mild cervical spondylosis as described above without significant disc protrusion, foraminal stenosis or central canal stenosis.   Blood work from 05/14/2014 was reviewed: She had a BMP, CBC with differential, liver function panel, lipid panel and TSH all of which were fine with the exception of a borderline LDL of 111.

## 2022-01-03 NOTE — Patient Instructions (Signed)
Please remember, botulinum toxin takes about 3-7 days to kick in. As discussed, this is not a pain shot. The purpose of the injections is to gradually improve your symptoms. In some patients it takes up to 2-3 weeks to make a difference and it wears off with time. Sometimes it may wear off before it is time for the next injection. We still should wait till the next 3 monthly injection, because injecting too frequently may cause you to develop immunity to the botulinum toxin. We are looking for a reduction in the severity and/or frequency of your symptoms. As a reminder, side effects to look out for are (but not limited to): mouth dryness, dryness of the eyes, heaviness of your head or muscle weakness, including droopy face or droopy eyelid(s), rarely: speech or swallowing difficulties and very rarely: breathing difficulties. Some people have transient neck pain or soreness which typically responds to over-the-counter anti-inflammatory medication and local heat application with a heat pad. If you think you have a severe reaction to the botulinum toxin, such as weakness, trouble speaking, trouble breathing, or trouble swallowing, you have to call 911 or have someone take you to the nearest emergency room. However, most people have either no or minimal side effects from the injections. It is normal to have a little bit of redness and swelling around the injection sites which usually improves after a few hours. Rarely, there may be a bruise that improves on its own. Most side effects reported are very mild and resolve within 10-14 days. Please feel free to call us if you have any additional questions or concerns: [phone] or email [name] through My Chart. We may have to adjust the dose over time, depending on your results from this injection and your overall response over time to this medication.   Please note, that any botox injections you are getting can have an additive/cumulative effect, including botox cosmetic  and botox for jaw clenching and may increase your risk for side effects.

## 2022-01-04 DIAGNOSIS — D134 Benign neoplasm of liver: Secondary | ICD-10-CM | POA: Diagnosis not present

## 2022-01-05 DIAGNOSIS — M722 Plantar fascial fibromatosis: Secondary | ICD-10-CM | POA: Diagnosis not present

## 2022-01-06 DIAGNOSIS — M722 Plantar fascial fibromatosis: Secondary | ICD-10-CM | POA: Diagnosis not present

## 2022-01-09 ENCOUNTER — Encounter (INDEPENDENT_AMBULATORY_CARE_PROVIDER_SITE_OTHER): Payer: Self-pay | Admitting: Family Medicine

## 2022-01-09 ENCOUNTER — Telehealth (INDEPENDENT_AMBULATORY_CARE_PROVIDER_SITE_OTHER): Payer: Self-pay | Admitting: Family Medicine

## 2022-01-09 ENCOUNTER — Encounter (INDEPENDENT_AMBULATORY_CARE_PROVIDER_SITE_OTHER): Payer: Self-pay

## 2022-01-09 NOTE — Telephone Encounter (Signed)
Dr. Leafy Ro - Prior authorization denied for [facility]. Per insurance: not a covered benefit/plan exclusion. Obesity/weight loss medications is not covered. Patient sent denial message via mychart.

## 2022-01-09 NOTE — Telephone Encounter (Signed)
Prior authorization has already been started for [facility]. Will notify patient and provider once a response is received. I will send patient a mychart message, informing PA already started. Thanks!

## 2022-01-11 ENCOUNTER — Encounter (INDEPENDENT_AMBULATORY_CARE_PROVIDER_SITE_OTHER): Payer: Self-pay

## 2022-01-11 ENCOUNTER — Encounter (INDEPENDENT_AMBULATORY_CARE_PROVIDER_SITE_OTHER): Payer: Self-pay | Admitting: Family Medicine

## 2022-01-11 NOTE — Telephone Encounter (Signed)
Please advise 

## 2022-01-17 ENCOUNTER — Encounter (INDEPENDENT_AMBULATORY_CARE_PROVIDER_SITE_OTHER): Payer: Self-pay | Admitting: Family Medicine

## 2022-01-17 ENCOUNTER — Ambulatory Visit (INDEPENDENT_AMBULATORY_CARE_PROVIDER_SITE_OTHER): Payer: 59 | Admitting: Family Medicine

## 2022-01-17 ENCOUNTER — Ambulatory Visit (INDEPENDENT_AMBULATORY_CARE_PROVIDER_SITE_OTHER): Payer: BC Managed Care – PPO | Admitting: Family Medicine

## 2022-01-17 VITALS — BP 118/75 | HR 97 | Temp 98.4°F | Ht 63.0 in | Wt 202.0 lb

## 2022-01-17 DIAGNOSIS — Z6835 Body mass index (BMI) 35.0-35.9, adult: Secondary | ICD-10-CM | POA: Diagnosis not present

## 2022-01-17 DIAGNOSIS — E669 Obesity, unspecified: Secondary | ICD-10-CM | POA: Diagnosis not present

## 2022-01-17 DIAGNOSIS — R7303 Prediabetes: Secondary | ICD-10-CM | POA: Diagnosis not present

## 2022-01-17 DIAGNOSIS — Z7985 Long-term (current) use of injectable non-insulin antidiabetic drugs: Secondary | ICD-10-CM

## 2022-01-17 DIAGNOSIS — E559 Vitamin D deficiency, unspecified: Secondary | ICD-10-CM | POA: Diagnosis not present

## 2022-01-17 MED ORDER — RYBELSUS 3 MG PO TABS: 3.0000 mg | ORAL_TABLET | Freq: Every day | ORAL | 0 refills | Status: DC

## 2022-01-17 MED ORDER — VITAMIN D (ERGOCALCIFEROL) 1.25 MG (50000 UNIT) PO CAPS: 50000.0000 [IU] | ORAL_CAPSULE | ORAL | 0 refills | Status: DC

## 2022-01-23 ENCOUNTER — Telehealth (INDEPENDENT_AMBULATORY_CARE_PROVIDER_SITE_OTHER): Payer: Self-pay | Admitting: Family Medicine

## 2022-01-23 ENCOUNTER — Encounter (INDEPENDENT_AMBULATORY_CARE_PROVIDER_SITE_OTHER): Payer: Self-pay

## 2022-01-23 NOTE — Telephone Encounter (Signed)
Dr. Leafy Ro - Prior authorization denied for Rybelsus. Per insurance: Patient does not have type 2 diabetes. Patient sent denial message via mychart.

## 2022-01-24 NOTE — Progress Notes (Unsigned)
Chief Complaint:   OBESITY Morgan Long is here to discuss her progress with her obesity treatment plan along with follow-up of her obesity related diagnoses. Morgan Long is on the Category 2 Plan and states she is following her eating plan approximately 60% of the time. Morgan Long states she is walking for 60 minutes 7 times per week.  Today's visit was #: 16 Starting weight: 206 lbs Starting date: 02/02/2021 Today's weight: 202 lbs Today's date: 01/17/2022 Total lbs lost to date: 4 Total lbs lost since last in-office visit: 0  Interim History: Morgan Long is struggling with hunger and cravings, especially in the PM. She eats less in the daytime which may be setting her up for increased PM hunger.   Subjective:   1. Prediabetes Morgan Long notes increased polyphagia and she is struggling with weight loss. She did well on Ozempic in the past but her insurance will no longer cover.   2. Vitamin D deficiency Morgan Long is stable on Vitamin D prescription. She denies nausea, vomiting, or muscle weakness.   Assessment/Plan:   1. Prediabetes Morgan Long agreed to start Rybelsus 3 mg once daily with no refills. Morgan Long will continue to work on weight loss, exercise, and decreasing simple carbohydrates to help decrease the risk of diabetes.   - Semaglutide (RYBELSUS) 3 MG TABS; Take 3 mg by mouth daily.  Dispense: 30 tablet; Refill: 0  2. Vitamin D deficiency We will refill prescription Vitamin D 50,000 IU every week for 1 month. Morgan Long will follow-up for routine testing of Vitamin D, at least 2-3 times per year to avoid over-replacement.  - Vitamin D, Ergocalciferol, (DRISDOL) 1.25 MG (50000 UNIT) CAPS capsule; Take 1 capsule (50,000 Units total) by mouth every 7 (seven) days.  Dispense: 4 capsule; Refill: 0  3. Obesity, Current BMI 35.9 Morgan Long is currently in the action stage of change. As such, her goal is to continue with weight loss efforts. She has agreed to the Category 2 Plan.   Morgan Long is to increase her daytime nutrition and  increase her PM protein, and she is trying to go to bed earlier when cravings are too strong at night.   Exercise goals: As is.  Behavioral modification strategies: meal planning and cooking strategies.  Morgan Long has agreed to follow-up with our clinic in 3 to 4 weeks. She was informed of the importance of frequent follow-up visits to maximize her success with intensive lifestyle modifications for her multiple health conditions.   Objective:   Blood pressure 118/75, pulse 97, temperature 98.4 F (36.9 C), height '5\' 3"'$  (1.6 m), weight 202 lb (91.6 kg), SpO2 97 %. Body mass index is 35.78 kg/m.  General: Cooperative, alert, well developed, in no acute distress. HEENT: Conjunctivae and lids unremarkable. Cardiovascular: Regular rhythm.  Lungs: Normal work of breathing. Neurologic: No focal deficits.   Lab Results  Component Value Date   CREATININE 0.83 07/07/2021   BUN 13 07/07/2021   NA 139 07/07/2021   K 4.5 07/07/2021   CL 104 07/07/2021   CO2 22 07/07/2021   Lab Results  Component Value Date   ALT 18 07/07/2021   AST 18 07/07/2021   ALKPHOS 83 07/07/2021   BILITOT 0.2 07/07/2021   Lab Results  Component Value Date   HGBA1C 5.9 (H) 07/07/2021   HGBA1C 5.9 (H) 02/02/2021   HGBA1C 5.9 (H) 05/06/2018   HGBA1C 5.8 (H) 01/23/2018   HGBA1C 5.5 08/14/2017   Lab Results  Component Value Date   INSULIN 14.2 07/07/2021   INSULIN  12.8 02/02/2021   INSULIN 11.2 05/06/2018   INSULIN 8.4 01/23/2018   INSULIN 8.0 08/14/2017   Lab Results  Component Value Date   TSH 1.590 02/02/2021   Lab Results  Component Value Date   CHOL 195 07/07/2021   HDL 71 07/07/2021   LDLCALC 109 (H) 07/07/2021   TRIG 81 07/07/2021   CHOLHDL 2.7 07/07/2021   Lab Results  Component Value Date   VD25OH 60.7 07/07/2021   VD25OH 44.8 02/02/2021   VD25OH 35.92 11/02/2020   Lab Results  Component Value Date   WBC 7.1 02/21/2021   HGB 12.8 07/27/2021   HCT 38.2 07/27/2021   MCV 91.0  02/21/2021   PLT 355.0 02/21/2021   Lab Results  Component Value Date   FERRITIN 169 05/21/2019   Attestation Statements:   Reviewed by clinician on day of visit: allergies, medications, problem list, medical history, surgical history, family history, social history, and previous encounter notes.   I, Trixie Dredge, am acting as transcriptionist for Dennard Nip, MD.  I have reviewed the above documentation for accuracy and completeness, and I agree with the above. -  Dennard Nip, MD

## 2022-01-29 ENCOUNTER — Encounter: Payer: Self-pay | Admitting: Family Medicine

## 2022-01-30 ENCOUNTER — Encounter: Payer: Self-pay | Admitting: Family Medicine

## 2022-02-07 ENCOUNTER — Telehealth: Payer: Self-pay

## 2022-02-07 NOTE — Telephone Encounter (Signed)
PA initiated via Covermymeds; KEY: [id number]. Awaiting determination.

## 2022-02-07 NOTE — Telephone Encounter (Signed)
PA approved. Effective from 02/07/2022 through 02/06/2023.

## 2022-02-07 NOTE — Telephone Encounter (Signed)
Noted  

## 2022-02-07 NOTE — Telephone Encounter (Signed)
Phone call received stating the PA was approved, they will fax over the confirmation soon.

## 2022-02-08 ENCOUNTER — Telehealth: Payer: Self-pay | Admitting: *Deleted

## 2022-02-08 NOTE — Telephone Encounter (Signed)
Patient now has [place] again. Effective date 11/03/2021. I spoke with Ciara T (reference [id number]) and was told the codes [phone] and [phone] do not require authorization. I spoke with Janae Sauce (reference [id number] [id number]) and was directed online to the most recent Botox authorization form. She also recommended specialty pharmacy to be [facility] or [facility]. Spoke with Candee Furbish (reference [id number]) and was told they cannot retroactively cover a previous injection with no auth.

## 2022-02-14 ENCOUNTER — Encounter: Payer: Self-pay | Admitting: Family Medicine

## 2022-02-14 ENCOUNTER — Ambulatory Visit (INDEPENDENT_AMBULATORY_CARE_PROVIDER_SITE_OTHER): Payer: BC Managed Care – PPO | Admitting: Family Medicine

## 2022-02-14 VITALS — BP 110/60 | HR 97 | Temp 98.3°F | Resp 18 | Ht 63.0 in | Wt 201.0 lb

## 2022-02-14 DIAGNOSIS — D1723 Benign lipomatous neoplasm of skin and subcutaneous tissue of right leg: Secondary | ICD-10-CM

## 2022-02-14 DIAGNOSIS — Z6835 Body mass index (BMI) 35.0-35.9, adult: Secondary | ICD-10-CM

## 2022-02-14 DIAGNOSIS — R110 Nausea: Secondary | ICD-10-CM | POA: Diagnosis not present

## 2022-02-14 MED ORDER — WEGOVY 0.25 MG/0.5ML ~~LOC~~ SOAJ: 0.2500 mg | SUBCUTANEOUS | 0 refills | Status: DC

## 2022-02-14 MED ORDER — ONDANSETRON 8 MG PO TBDP: 8.0000 mg | ORAL_TABLET | Freq: Three times a day (TID) | ORAL | 0 refills | Status: DC | PRN

## 2022-02-14 NOTE — Telephone Encounter (Signed)
Per [employer] Botox PA criteria, patient must try and have an inadequate response to at least one CGRP medication (ex. Aimovig, Emgality, Ajovy, Vyepti) or must have a contraindication to all of them.   PA has not been done yet as this criteria needs to be addressed first. I see no record that patient has tried any CGRP injection.

## 2022-02-14 NOTE — Telephone Encounter (Signed)
Please notify patient and if she is agreeable, please send in a prescription for Ajovy monthly injections for migraine prevention.

## 2022-02-14 NOTE — Patient Instructions (Signed)
Lipoma  A lipoma is a noncancerous (benign) tumor that is made up of fat cells. This is a very common type of soft-tissue growth. Lipomas are usually found under the skin (subcutaneous). They may occur in any tissue of the body that contains fat. Common areas for lipomas to appear include the back, arms, shoulders, buttocks, and thighs. Lipomas grow slowly, and they are usually painless. Most lipomas do not cause problems and do not require treatment. What are the causes? The cause of this condition is not known. What increases the risk? You are more likely to develop this condition if: You are 40-60 years old. You have a family history of lipomas. What are the signs or symptoms? A lipoma usually appears as a small, round bump under the skin. In most cases, the lump will: Feel soft or rubbery. Not cause pain or other symptoms. However, if a lipoma is located in an area where it pushes on nerves, it can become painful or cause other symptoms. How is this diagnosed? A lipoma can usually be diagnosed with a physical exam. You may also have tests to confirm the diagnosis and to rule out other conditions. Tests may include: Imaging tests, such as a CT scan or an MRI. Removal of a tissue sample to be looked at under a microscope (biopsy). How is this treated? Treatment for this condition depends on the size of the lipoma and whether it is causing any symptoms. For small lipomas that are not causing problems, no treatment is needed. If a lipoma is bigger or it causes problems, surgery may be done to remove the lipoma. Lipomas can also be removed to improve appearance. Most often, the procedure is done after applying a medicine that numbs the area (local anesthetic). Liposuction may be done to reduce the size of the lipoma before it is removed through surgery, or it may be done to remove the lipoma. Lipomas are removed with this method to limit incision size and scarring. A liposuction tube is  inserted through a small incision into the lipoma, and the contents of the lipoma are removed through the tube with suction. Follow these instructions at home: Watch your lipoma for any changes. Keep all follow-up visits. This is important. Where to find more information OrthoInfo: orthoinfo.aaos.org Contact a health care provider if: Your lipoma becomes larger or hard. Your lipoma becomes painful, red, or increasingly swollen. These could be signs of infection or a more serious condition. Get help right away if: You develop tingling or numbness in an area near the lipoma. This could indicate that the lipoma is causing nerve damage. Summary A lipoma is a noncancerous tumor that is made up of fat cells. Most lipomas do not cause problems and do not require treatment. If a lipoma is bigger or it causes problems, surgery may be done to remove the lipoma. Contact a health care provider if your lipoma becomes larger or hard, or if it becomes painful, red, or increasingly swollen. These could be signs of infection or a more serious condition. This information is not intended to replace advice given to you by your health care provider. Make sure you discuss any questions you have with your health care provider. Document Revised: 06/10/2021 Document Reviewed: 06/10/2021 Elsevier Patient Education  2023 Elsevier Inc.  

## 2022-02-14 NOTE — Progress Notes (Addendum)
Subjective:   By signing my name below, I, [name], attest that this documentation has been prepared under the direction and in the presence of [name], DO  02/14/2022    Patient ID: Morgan Long, female    DOB: Oct 19, 1970, 51 y.o.   MRN: [id number]  Chief Complaint  Patient presents with   Mass    Pt states having a lump on upper right thigh and right ear. Both lumps are hard and sore. No discharge    HPI Patient is in today for a office visit.   She complain of a lump on her right thigh and right ear. They are both hard and sore. She denies any discharge coming from either lumps.  She continues taking 1.7 wegovy and reports her appetite is almost gone. She is forcing herself to eat and drink. She notes eating too much causes her to vomit. She is interested in medication to help with her nausea.    Past Medical History:  Diagnosis Date   Allergic rhinitis    Asthma    B12 deficiency    Chronic migraine    Dr. Catalina Gravel   Constipation    Dry eye syndrome of both lacrimal glands    Dry skin    Endometriosis    Fatigue    Gastroparesis    GERD (gastroesophageal reflux disease)    Headache(784.0)    occasional, dx w/ Migraines before, Topamax helps   Heartburn    Hepatic steatosis    History of stomach ulcers    Lactose intolerance    Localized edema    Migraine    Muscle weakness (generalized)    Nuclear cataract of both eyes    Mild   Overweight    Pain in right ankle and joints of right foot    Prediabetes    Shortness of breath    Sinus complaint    Sinusitis    Tired    TMJ pain dysfunction syndrome    occasional   Vitamin D deficiency     Past Surgical History:  Procedure Laterality Date   ANKLE SURGERY Left 11/16/2017   AUGMENTATION MAMMAPLASTY Bilateral 2006   BREAST ENHANCEMENT SURGERY  2006   BUNIONECTOMY     COLONOSCOPY     endrometroisis     fallopian tube removed     Left   LAPAROSCOPIC PARTIAL HEPATECTOMY     [facility] --- due to hepatic tumor   UPPER GASTROINTESTINAL ENDOSCOPY     [facility] teeth extraction      Family History  Problem Relation Age of Onset   Throat cancer Mother    Cancer - Other Mother 8       throat - died 22 months   Cancer Mother    Alcoholism Mother    Stroke Father    Heart disease Father    Cancer Father    Colon cancer Paternal Grandfather    Breast cancer Maternal Aunt        breast   Breast cancer Maternal Aunt        breast   Pancreatic cancer Maternal Aunt    Hypertension Maternal Aunt        several family members   Breast cancer Maternal Aunt        total of 5 aunts   Breast cancer Maternal Aunt    Colon cancer Maternal Uncle 64       died 71   Throat cancer Maternal Uncle  Diabetes Other        grandmother   Asthma Other        cousin, maternal   Heart attack Neg Hx    Rectal cancer Neg Hx    Stomach cancer Neg Hx     Social History   Socioeconomic History   Marital status: Single    Spouse name: Not on file   Number of children: 0   Years of education: BS   Highest education level: Not on file  Occupational History   Occupation: Paramedic, going to school    Employer: [employer]   Occupation: Air traffic controller  Tobacco Use   Smoking status: Never    Passive exposure: Never   Smokeless tobacco: Never  Vaping Use   Vaping Use: Never used  Substance and Sexual Activity   Alcohol use: Not Currently    Comment: socially - occasional    Drug use: No   Sexual activity: Yes    Birth control/protection: Inserts    Comment: nuvaring  Other Topics Concern   Not on file  Social History Narrative   Household:sister and her 3 kids    Drinks occasional starbucks drink       Social Determinants of Health   Financial Resource Strain: Not on file  Food Insecurity: Not on file  Transportation Needs: Not on file  Physical Activity: Not on file  Stress: Not on file  Social Connections: Not on file  Intimate Partner  Violence: Not on file    Outpatient Medications Prior to Visit  Medication Sig Dispense Refill   albuterol (VENTOLIN HFA) 108 (90 Base) MCG/ACT inhaler UP TO 2 PUFFS EVERY 4 HOURS AS NEEDED 8.5 each 2   azelastine (OPTIVAR) 0.05 % ophthalmic solution Place 1 drop into both eyes 2 (two) times daily. 6 mL 12   cetirizine (ZYRTEC) 10 MG tablet TAKE 1 TABLET BY MOUTH DAILY 30 tablet 5   clindamycin (CLEOCIN T) 1 % external solution Apply topically 2 (two) times daily. 30 mL 5   cyanocobalamin (,VITAMIN B-12,) 1000 MCG/ML injection INJECT 1 ML (1,000 MCG TOTAL) INTO THE MUSCLE ONCE FOR 1 DOSE. 3 mL 3   ESTROGEL 0.75 MG/1.25 GM (0.06%) topical gel Apply 1 application topically daily.  0   etonogestrel-ethinyl estradiol (NUVARING) 0.12-0.015 MG/24HR vaginal ring Place 1 each vaginally every 28 (twenty-eight) days. Insert vaginally and leave in place for 3 consecutive weeks, then remove for 1 week.     Multiple Vitamin (MULTIVITAMIN) LIQD Take 5 mLs by mouth daily.     PREVIDENT 5000 SENSITIVE 1.1-5 % PSTE USE BEFORE BED DO NOT RINSE     Ubrogepant ([name]) 50 MG TABS 1 PILL AT ONSET OF MIGRAINE, MAY REPEAT IN 2 HOURS, NO MORE THAN 2 PILLS IN 24 HOURS.). 10 tablet 3   Vitamin D, Ergocalciferol, (DRISDOL) 1.25 MG (50000 UNIT) CAPS capsule Take 1 capsule (50,000 Units total) by mouth every 7 (seven) days. 4 capsule 0   Semaglutide (RYBELSUS) 3 MG TABS Take 3 mg by mouth daily. 30 tablet 0   No facility-administered medications prior to visit.    Allergies  Allergen Reactions   Acetaminophen-Codeine Itching   Hydrocodone Nausea Only   Oxycodone Itching   Tyloxapol Other (See Comments)    Other reaction(s): Unknown   Codeine Itching and Nausea Only    Review of Systems  Constitutional:  Negative for fever and malaise/fatigue.  HENT:  Negative for congestion.   Eyes:  Negative for  blurred vision.  Respiratory:  Negative for shortness of breath.   Cardiovascular:  Negative for chest pain,  palpitations and leg swelling.  Gastrointestinal:  Positive for nausea and vomiting. Negative for abdominal pain and blood in stool.  Genitourinary:  Negative for dysuria and frequency.  Musculoskeletal:  Negative for falls.  Skin:  Negative for rash.       (+)lump on right ear and right thigh  Neurological:  Negative for dizziness, loss of consciousness and headaches.  Endo/Heme/Allergies:  Negative for environmental allergies.  Psychiatric/Behavioral:  Negative for depression. The patient is not nervous/anxious.        Objective:    Physical Exam Vitals and nursing note reviewed.  Constitutional:      General: She is not in acute distress.    Appearance: Normal appearance. She is not ill-appearing.  HENT:     Head: Normocephalic and atraumatic.     Right Ear: External ear normal.     Left Ear: External ear normal.  Eyes:     Extraocular Movements: Extraocular movements intact.     Pupils: Pupils are equal, round, and reactive to light.  Cardiovascular:     Rate and Rhythm: Normal rate and regular rhythm.     Heart sounds: Normal heart sounds. No murmur heard.    No gallop.  Pulmonary:     Effort: Pulmonary effort is normal. No respiratory distress.     Breath sounds: Normal breath sounds. No wheezing or rales.  Skin:    General: Skin is warm and dry.     Findings: Lesion present.          Comments: R upper thigh--- large lipoma about 3-4 inch in diameter   + small seb cyst behind R ear --- tender to touch , no drainage   Neurological:     Mental Status: She is alert and oriented to person, place, and time.  Psychiatric:        Judgment: Judgment normal.     BP 110/60 (BP Location: Left Arm, Patient Position: Sitting, Cuff Size: Large)   Pulse 97   Temp 98.3 F (36.8 C) (Oral)   Resp 18   Ht 5' 3"  (1.6 m)   Wt 201 lb (91.2 kg)   SpO2 97%   BMI 35.61 kg/m  Wt Readings from Last 3 Encounters:  02/14/22 201 lb (91.2 kg)  01/17/22 202 lb (91.6 kg)  12/27/21  206 lb (93.4 kg)    Diabetic Foot Exam - Simple   No data filed    Lab Results  Component Value Date   WBC 7.1 02/21/2021   HGB 12.8 07/27/2021   HCT 38.2 07/27/2021   PLT 355.0 02/21/2021   GLUCOSE 87 07/07/2021   CHOL 195 07/07/2021   TRIG 81 07/07/2021   HDL 71 07/07/2021   LDLCALC 109 (H) 07/07/2021   ALT 18 07/07/2021   AST 18 07/07/2021   NA 139 07/07/2021   K 4.5 07/07/2021   CL 104 07/07/2021   CREATININE 0.83 07/07/2021   BUN 13 07/07/2021   CO2 22 07/07/2021   TSH 1.590 02/02/2021   INR 1.0 12/15/2019   HGBA1C 5.9 (H) 07/07/2021    Lab Results  Component Value Date   TSH 1.590 02/02/2021   Lab Results  Component Value Date   WBC 7.1 02/21/2021   HGB 12.8 07/27/2021   HCT 38.2 07/27/2021   MCV 91.0 02/21/2021   PLT 355.0 02/21/2021   Lab Results  Component Value Date  NA 139 07/07/2021   K 4.5 07/07/2021   CO2 22 07/07/2021   GLUCOSE 87 07/07/2021   BUN 13 07/07/2021   CREATININE 0.83 07/07/2021   BILITOT 0.2 07/07/2021   ALKPHOS 83 07/07/2021   AST 18 07/07/2021   ALT 18 07/07/2021   PROT 7.0 07/07/2021   ALBUMIN 4.3 07/07/2021   CALCIUM 8.9 07/07/2021   ANIONGAP 7 11/10/2020   EGFR 85 07/07/2021   GFR 78.83 02/21/2021   Lab Results  Component Value Date   CHOL 195 07/07/2021   Lab Results  Component Value Date   HDL 71 07/07/2021   Lab Results  Component Value Date   LDLCALC 109 (H) 07/07/2021   Lab Results  Component Value Date   TRIG 81 07/07/2021   Lab Results  Component Value Date   CHOLHDL 2.7 07/07/2021   Lab Results  Component Value Date   HGBA1C 5.9 (H) 07/07/2021       Assessment & Plan:   Problem List Items Addressed This Visit   None Visit Diagnoses     Class 2 severe obesity with serious comorbidity and body mass index (BMI) of 35.0 to 35.9 in adult, unspecified obesity type ([place])    -  Primary   Relevant Medications   Semaglutide-Weight Management (WEGOVY) 0.25 MG/0.5ML SOAJ   Lipoma of right  lower extremity       Relevant Orders   Ambulatory referral to Plastic Surgery   Nausea       Relevant Medications   ondansetron (ZOFRAN-ODT) 8 MG disintegrating tablet        Meds ordered this encounter  Medications   Semaglutide-Weight Management (WEGOVY) 0.25 MG/0.5ML SOAJ    Sig: Inject 0.25 mg into the skin once a week.    Dispense:  2 mL    Refill:  0   ondansetron (ZOFRAN-ODT) 8 MG disintegrating tablet    Sig: Take 1 tablet (8 mg total) by mouth every 8 (eight) hours as needed for nausea or vomiting.    Dispense:  20 tablet    Refill:  0    I, [name], DO, personally preformed the services described in this documentation.  All medical record entries made by the scribe were at my direction and in my presence.  I have reviewed the chart and discharge instructions (if applicable) and agree that the record reflects my personal performance and is accurate and complete. 02/14/2022   I,[name],acting as a Education administrator for [employer], DO.,have documented all relevant documentation on the behalf of [name], DO,as directed by  [name], DO while in the presence of [name], DO.   [name], DO

## 2022-02-15 ENCOUNTER — Telehealth: Payer: Self-pay

## 2022-02-15 NOTE — Telephone Encounter (Signed)
LMVM for pt to return call about denial and needing to try another injectable first.  Sent mychart message as well.

## 2022-02-15 NOTE — Telephone Encounter (Signed)
PA denied. Awaiting denial information.  °

## 2022-02-15 NOTE — Telephone Encounter (Signed)
PA initiated via Covermymeds; KEY: [id number]. Awaiting determination.

## 2022-02-15 NOTE — Telephone Encounter (Signed)
Pt's insurance does not cover weight loss medications.

## 2022-02-16 ENCOUNTER — Encounter (INDEPENDENT_AMBULATORY_CARE_PROVIDER_SITE_OTHER): Payer: Self-pay | Admitting: Family Medicine

## 2022-02-16 ENCOUNTER — Ambulatory Visit (INDEPENDENT_AMBULATORY_CARE_PROVIDER_SITE_OTHER): Payer: BC Managed Care – PPO | Admitting: Family Medicine

## 2022-02-16 VITALS — BP 117/72 | HR 80 | Temp 98.1°F | Ht 63.0 in | Wt 198.0 lb

## 2022-02-16 DIAGNOSIS — E538 Deficiency of other specified B group vitamins: Secondary | ICD-10-CM

## 2022-02-16 DIAGNOSIS — R7303 Prediabetes: Secondary | ICD-10-CM

## 2022-02-16 DIAGNOSIS — E559 Vitamin D deficiency, unspecified: Secondary | ICD-10-CM

## 2022-02-16 DIAGNOSIS — E669 Obesity, unspecified: Secondary | ICD-10-CM | POA: Diagnosis not present

## 2022-02-16 DIAGNOSIS — Z6835 Body mass index (BMI) 35.0-35.9, adult: Secondary | ICD-10-CM

## 2022-02-16 MED ORDER — VITAMIN D (ERGOCALCIFEROL) 1.25 MG (50000 UNIT) PO CAPS: 50000.0000 [IU] | ORAL_CAPSULE | ORAL | 0 refills | Status: DC

## 2022-02-16 MED ORDER — METFORMIN HCL 500 MG PO TABS: 500.0000 mg | ORAL_TABLET | Freq: Every day | ORAL | 0 refills | Status: DC

## 2022-02-17 ENCOUNTER — Encounter: Payer: Self-pay | Admitting: Family Medicine

## 2022-02-17 LAB — TSH: TSH: 0.697 u[IU]/mL (ref 0.450–4.500)

## 2022-02-17 LAB — CMP14+EGFR
ALT: 28 IU/L (ref 0–32)
AST: 20 IU/L (ref 0–40)
Albumin/Globulin Ratio: 1.5 (ref 1.2–2.2)
Albumin: 4.4 g/dL (ref 3.8–4.9)
Alkaline Phosphatase: 107 IU/L (ref 44–121)
BUN/Creatinine Ratio: 19 (ref 9–23)
BUN: 14 mg/dL (ref 6–24)
Bilirubin Total: 0.3 mg/dL (ref 0.0–1.2)
CO2: 22 mmol/L (ref 20–29)
Calcium: 9.3 mg/dL (ref 8.7–10.2)
Chloride: 99 mmol/L (ref 96–106)
Creatinine, Ser: 0.74 mg/dL (ref 0.57–1.00)
Globulin, Total: 3 g/dL (ref 1.5–4.5)
Glucose: 62 mg/dL — ABNORMAL LOW (ref 70–99)
Potassium: 4.2 mmol/L (ref 3.5–5.2)
Sodium: 138 mmol/L (ref 134–144)
Total Protein: 7.4 g/dL (ref 6.0–8.5)
eGFR: 97 mL/min/{1.73_m2} (ref >59)

## 2022-02-17 LAB — VITAMIN D 25 HYDROXY (VIT D DEFICIENCY, FRACTURES): Vit D, 25-Hydroxy: 54.5 ng/mL (ref 30.0–100.0)

## 2022-02-17 LAB — LIPID PANEL WITH LDL/HDL RATIO
Cholesterol, Total: 220 mg/dL — ABNORMAL HIGH (ref 100–199)
HDL: 82 mg/dL (ref >39)
LDL Chol Calc (NIH): 125 mg/dL — ABNORMAL HIGH (ref 0–99)
LDL/HDL Ratio: 1.5 ratio (ref 0.0–3.2)
Triglycerides: 76 mg/dL (ref 0–149)
VLDL Cholesterol Cal: 13 mg/dL (ref 5–40)

## 2022-02-17 LAB — INSULIN, RANDOM: INSULIN: 22.3 u[IU]/mL (ref 2.6–24.9)

## 2022-02-17 LAB — HEMOGLOBIN A1C
Est. average glucose Bld gHb Est-mCnc: 120 mg/dL
Hgb A1c MFr Bld: 5.8 % — ABNORMAL HIGH (ref 4.8–5.6)

## 2022-02-17 LAB — VITAMIN B12: Vitamin B-12: 1316 pg/mL — ABNORMAL HIGH (ref 232–1245)

## 2022-02-20 ENCOUNTER — Other Ambulatory Visit: Payer: Self-pay | Admitting: *Deleted

## 2022-02-20 ENCOUNTER — Other Ambulatory Visit: Payer: Self-pay | Admitting: Family

## 2022-02-20 MED ORDER — PROMETHAZINE HCL 12.5 MG PO TABS: 12.5000 mg | ORAL_TABLET | Freq: Four times a day (QID) | ORAL | 0 refills | Status: DC | PRN

## 2022-02-20 MED ORDER — AJOVY 225 MG/1.5ML ~~LOC~~ SOAJ: 225.0000 mg | SUBCUTANEOUS | 5 refills | Status: DC

## 2022-02-20 MED ORDER — MICONAZOLE 3 4 % VA CREA: 1.0000 | TOPICAL_CREAM | Freq: Every evening | VAGINAL | 0 refills | Status: AC

## 2022-02-20 NOTE — Telephone Encounter (Signed)
Key: [id number] for AJOVY on CMM.  Determination pending.

## 2022-02-21 NOTE — Progress Notes (Unsigned)
Chief Complaint:   OBESITY Morgan Long is here to discuss her progress with her obesity treatment plan along with follow-up of her obesity related diagnoses. Morgan Long is on the Category 2 Plan and states she is following her eating plan approximately 50% of the time. Morgan Long states she is walking for 120 minutes 4 times per week.  Today's visit was #: 38 Starting weight: 206 lbs Starting date: 02/02/2021 Today's weight: 198 lbs Today's date: 02/16/2022 Total lbs lost to date: 8 Total lbs lost since last in-office visit: 4  Interim History: Morgan Long has had painful dental procedures and she has been unable to eat, which has caused weight loss.  She is at high risk of sarcopenia and decreased RMR.  Subjective:   1. Prediabetes Morgan Long is unable to take GLP-one due to insurance refusals to cover.  She is open to other options.  2. Vitamin D deficiency Morgan Long's last vitamin D level was at goal, and she is due to have labs.  3. B12 deficiency Morgan Long is on B12 injections for her PCP, and she is due for labs.  Assessment/Plan:   1. Prediabetes We will check labs today. Shakyra agreed to start metformin 500 mg q AM with no refills. She will continue to work on weight loss, exercise, and decreasing simple carbohydrates to help decrease the risk of diabetes.   - CMP14+EGFR - Hemoglobin A1c - Insulin, random - Lipid Panel With LDL/HDL Ratio - TSH - metFORMIN (GLUCOPHAGE) 500 MG tablet; Take 1 tablet (500 mg total) by mouth daily with breakfast.  Dispense: 30 tablet; Refill: 0  2. Vitamin D deficiency We will check labs today, and we will refill prescription Vitamin D for 1 month. Morgan Long will follow-up for routine testing of Vitamin D, at least 2-3 times per year to avoid over-replacement.  - VITAMIN D 25 Hydroxy (Vit-D Deficiency, Fractures) - Vitamin D, Ergocalciferol, (DRISDOL) 1.25 MG (50000 UNIT) CAPS capsule; Take 1 capsule (50,000 Units total) by mouth every 7 (seven) days.  Dispense: 4 capsule; Refill:  0  3. B12 deficiency The diagnosis was reviewed with the patient. We will check labs today, and we will follow-up at Morgan Long's next visit. Orders and follow up as documented in patient record.  - Vitamin B12  4. Obesity, Current BMI 35.1 Morgan Long is currently in the action stage of change. As such, her goal is to continue with weight loss efforts. She has agreed to the Category 2 Plan.   Morgan Long was encouraged to continue to increase her protein with very soft food, and protein shakes are acceptable for now.   Exercise goals: As is.   Behavioral modification strategies: increasing lean protein intake.  Morgan Long has agreed to follow-up with our clinic in 3 to 4 weeks. She was informed of the importance of frequent follow-up visits to maximize her success with intensive lifestyle modifications for her multiple health conditions.   Morgan Long was informed we would discuss her lab results at her next visit unless there is a critical issue that needs to be addressed sooner. Morgan Long agreed to keep her next visit at the agreed upon time to discuss these results.  Objective:   Blood pressure 117/72, pulse 80, temperature 98.1 F (36.7 C), height _0  (1.6 m), weight 198 lb (89.8 kg), SpO2 98 %. Body mass index is 35.07 kg/m.  General: Cooperative, alert, well developed, in no acute distress. HEENT: Conjunctivae and lids unremarkable. Cardiovascular: Regular rhythm.  Lungs: Normal work of breathing. Neurologic: No focal deficits.  Lab Results  Component Value Date   CREATININE 0.74 02/16/2022   BUN 14 02/16/2022   NA 138 02/16/2022   K 4.2 02/16/2022   CL 99 02/16/2022   CO2 22 02/16/2022   Lab Results  Component Value Date   ALT 28 02/16/2022   AST 20 02/16/2022   ALKPHOS 107 02/16/2022   BILITOT 0.3 02/16/2022   Lab Results  Component Value Date   HGBA1C 5.8 (H) 02/16/2022   HGBA1C 5.9 (H) 07/07/2021   HGBA1C 5.9 (H) 02/02/2021   HGBA1C 5.9 (H) 05/06/2018   HGBA1C 5.8 (H) 01/23/2018    Lab Results  Component Value Date   INSULIN 22.3 02/16/2022   INSULIN 14.2 07/07/2021   INSULIN 12.8 02/02/2021   INSULIN 11.2 05/06/2018   INSULIN 8.4 01/23/2018   Lab Results  Component Value Date   TSH 0.697 02/16/2022   Lab Results  Component Value Date   CHOL 220 (H) 02/16/2022   HDL 82 02/16/2022   LDLCALC 125 (H) 02/16/2022   TRIG 76 02/16/2022   CHOLHDL 2.7 07/07/2021   Lab Results  Component Value Date   VD25OH 54.5 02/16/2022   VD25OH 60.7 07/07/2021   VD25OH 44.8 02/02/2021   Lab Results  Component Value Date   WBC 7.1 02/21/2021   HGB 12.8 07/27/2021   HCT 38.2 07/27/2021   MCV 91.0 02/21/2021   PLT 355.0 02/21/2021   Lab Results  Component Value Date   FERRITIN 169 05/21/2019   Attestation Statements:   Reviewed by clinician on day of visit: allergies, medications, problem list, medical history, surgical history, family history, social history, and previous encounter notes.   I, Trixie Dredge, am acting as transcriptionist for Dennard Nip, MD.  I have reviewed the above documentation for accuracy and completeness, and I agree with the above. -  Dennard Nip, MD

## 2022-02-27 ENCOUNTER — Encounter: Payer: Self-pay | Admitting: Plastic Surgery

## 2022-02-27 DIAGNOSIS — L819 Disorder of pigmentation, unspecified: Secondary | ICD-10-CM | POA: Diagnosis not present

## 2022-02-27 NOTE — Telephone Encounter (Signed)
I called and spoke to [facility] in Peer to Peer. She stated that since pt had botox 01-03-2022 will not be considered for CGRP until 3 months from that due to there criteria.  I told her that we have been botox to pt since 2019.  Last time 01-03-2022.  Botox denied as pt needed to try CGRP.  She could not see that we had attempted a PA.  I read off the notes from BC/RN and she looked at the ref# and did not see any botox attempt.  I said will initiate botox on CMM (her insurance is new so will be an initial start although as been on since 2019).  Sharlene Yeatman Key: [id number]  BOTOX 200units. 72 hr determination.

## 2022-02-27 NOTE — Telephone Encounter (Signed)
Denied for ajovy.  Medication will not be approved when it is taken with BOTOX or when the member has received in the last 3 months.  Pt received 01-03-2022.  Then it stated pt will be started on botox or has received botox within the last 3 months.   I called  and spoke to [facility] relayed that did not understand their denial.  He said could do a peer to peer today at 1530 will call.

## 2022-02-28 ENCOUNTER — Ambulatory Visit (INDEPENDENT_AMBULATORY_CARE_PROVIDER_SITE_OTHER): Payer: BC Managed Care – PPO

## 2022-02-28 ENCOUNTER — Encounter: Payer: Self-pay | Admitting: Family Medicine

## 2022-02-28 DIAGNOSIS — Z23 Encounter for immunization: Secondary | ICD-10-CM

## 2022-02-28 NOTE — Telephone Encounter (Signed)
I received a fax from [facility] about pts botox.  Provider is Dr. Rexene Alberts (not [name] Eagles) [facility] in intake whom I spoke to this afternoon at 1332, changed providers to Dr. Rexene Alberts.  He updated the information as a continuation of botox.  They will send for review.  Determination is pending.

## 2022-03-01 NOTE — Telephone Encounter (Signed)
Barbara Hase Key: [id number] . Outcome.  Approvedtoday.  Effective from 02/27/2022 through 01/28/2023 thru [place].

## 2022-03-02 ENCOUNTER — Other Ambulatory Visit (INDEPENDENT_AMBULATORY_CARE_PROVIDER_SITE_OTHER): Payer: Self-pay | Admitting: Family Medicine

## 2022-03-02 DIAGNOSIS — R7303 Prediabetes: Secondary | ICD-10-CM

## 2022-03-08 ENCOUNTER — Other Ambulatory Visit (HOSPITAL_COMMUNITY): Payer: Self-pay

## 2022-03-08 ENCOUNTER — Encounter: Payer: Self-pay | Admitting: Family Medicine

## 2022-03-08 ENCOUNTER — Telehealth: Payer: Self-pay | Admitting: *Deleted

## 2022-03-08 NOTE — Telephone Encounter (Signed)
Renewed on [facility] KEY [id number] for ubrelvy '50mg'$  tablets.

## 2022-03-08 NOTE — Telephone Encounter (Signed)
  Yes it is   You  Rx Prior Auth Team 2 hours ago (11:41 AM)   Is this approved for procedure as well?   Alika A Laster  You 4 hours ago (10:19 AM)   They also deny the other medication they said for me to get for the headaches and I can tell it's time for it.   Lenon Oms, CPhT  You 6 hours ago (8:00 AM)   This is covered through [place] and [place] Team 20 hours ago (5:27 PM)   Good afternoon,  would you mind doing a benefits summary relating to this pt getting botox.  Can you tell me if authorization on file , I placed in CMM and got approval. For the botox but want to make sure.  I did not get reference #.  Not sure if BB or specialty pharmacy.  Thank you  [facility] RN   Dx S17.793  J0300 CPT [phone]   You

## 2022-03-09 ENCOUNTER — Other Ambulatory Visit: Payer: Self-pay | Admitting: Family

## 2022-03-09 MED ORDER — MICONAZOLE NITRATE 2 % VA CREA: 1.0000 | TOPICAL_CREAM | Freq: Every day | VAGINAL | 0 refills | Status: DC

## 2022-03-13 NOTE — Telephone Encounter (Signed)
Received approval for [name] Approved on October 6 Effective from 03/08/2022 through 03/07/2023.

## 2022-03-14 ENCOUNTER — Encounter (INDEPENDENT_AMBULATORY_CARE_PROVIDER_SITE_OTHER): Payer: Self-pay | Admitting: Family Medicine

## 2022-03-14 ENCOUNTER — Ambulatory Visit (INDEPENDENT_AMBULATORY_CARE_PROVIDER_SITE_OTHER): Payer: BC Managed Care – PPO | Admitting: Family Medicine

## 2022-03-14 VITALS — BP 102/68 | HR 90 | Temp 98.4°F | Ht 63.0 in | Wt 196.0 lb

## 2022-03-14 DIAGNOSIS — Z6835 Body mass index (BMI) 35.0-35.9, adult: Secondary | ICD-10-CM

## 2022-03-14 DIAGNOSIS — E559 Vitamin D deficiency, unspecified: Secondary | ICD-10-CM

## 2022-03-14 DIAGNOSIS — E669 Obesity, unspecified: Secondary | ICD-10-CM | POA: Diagnosis not present

## 2022-03-14 DIAGNOSIS — Z6834 Body mass index (BMI) 34.0-34.9, adult: Secondary | ICD-10-CM

## 2022-03-14 DIAGNOSIS — R7303 Prediabetes: Secondary | ICD-10-CM

## 2022-03-14 DIAGNOSIS — E7849 Other hyperlipidemia: Secondary | ICD-10-CM | POA: Diagnosis not present

## 2022-03-14 MED ORDER — PHENTERMINE HCL 15 MG PO CAPS: 15.0000 mg | ORAL_CAPSULE | ORAL | 0 refills | Status: DC

## 2022-03-20 NOTE — Progress Notes (Unsigned)
Chief Complaint:   OBESITY Morgan Long is here to discuss her progress with her obesity treatment plan along with follow-up of her obesity related diagnoses. Morgan Long is on the Category 2 Plan and states she is following her eating plan approximately 70% of the time. Morgan Long states she is fast walking and using resistance band for 60 minutes 3 times per week.  Today's visit was #: 18 Starting weight: 206 lbs Starting date: 02/02/2021 Today's weight: 196 lbs Today's date: 03/14/2022 Total lbs lost to date: 10 Total lbs lost since last in-office visit: 2  Interim History: Morgan Long continues to work on her weight loss. She is limited with exercise due to increased migraine headache. She requests starting phentermine which she took in the past and tolerated it well.   Subjective:   1. Prediabetes Morgan Long has been off metformin due to pharmacy errors. Her fasting glucose was lower than expected, but her fasting insulin was elevated. I discussed labs with the patient today.   2. Other hyperlipidemia Morgan Long's triglycerides and HDL are very good, but her LDL has worsened. She is working on her diet and she is not on statin. I discussed labs with the patient today.   3. Vitamin D deficiency Morgan Long's Vitamin D level was at goal, and she denies nausea or vomiting. I discussed labs with the patient today.   Assessment/Plan:   1. Prediabetes Dezeray agreed to restart metformin (no refill needed) and make sure to not skip meals.   2. Other hyperlipidemia Oveta will continue with her diet, exercise, and weight loss.   3. Vitamin D deficiency Morgan Long prescription Vitamin D and will follow-up for routine testing of Vitamin D, at least 2-3 times per year to avoid over-replacement.  4. Obesity, Current BMI 34.9 Morgan Long is currently in the action stage of change. As such, her goal is to continue with weight loss efforts. She has agreed to the Category 2 Plan.   We discussed various medication options to help Morgan Long with her  weight loss efforts and we both agreed to start phentermine 15 mg q AM with no refills (phentermine contract signed today).  - phentermine 15 MG capsule; Take 1 capsule (15 mg total) by mouth every morning.  Dispense: 30 capsule; Refill: 0  Exercise goals: As is.   Behavioral modification strategies: increasing water intake and no skipping meals.  Morgan Long has agreed to follow-up with our clinic in 3 to 4 weeks. She was informed of the importance of frequent follow-up visits to maximize her success with intensive lifestyle modifications for her multiple health conditions.   Objective:   Blood pressure 102/68, pulse 90, temperature 98.4 F (36.9 C), height '5\' 3"'$  (1.6 m), weight 196 lb (88.9 kg), SpO2 97 %. Body mass index is 34.72 kg/m.  General: Cooperative, alert, well developed, in no acute distress. HEENT: Conjunctivae and lids unremarkable. Cardiovascular: Regular rhythm.  Lungs: Normal work of breathing. Neurologic: No focal deficits.   Lab Results  Component Value Date   CREATININE 0.74 02/16/2022   BUN 14 02/16/2022   NA 138 02/16/2022   K 4.2 02/16/2022   CL 99 02/16/2022   CO2 22 02/16/2022   Lab Results  Component Value Date   ALT 28 02/16/2022   AST 20 02/16/2022   ALKPHOS 107 02/16/2022   BILITOT 0.3 02/16/2022   Lab Results  Component Value Date   HGBA1C 5.8 (H) 02/16/2022   HGBA1C 5.9 (H) 07/07/2021   HGBA1C 5.9 (H) 02/02/2021   HGBA1C 5.9 (H)  05/06/2018   HGBA1C 5.8 (H) 01/23/2018   Lab Results  Component Value Date   INSULIN 22.3 02/16/2022   INSULIN 14.2 07/07/2021   INSULIN 12.8 02/02/2021   INSULIN 11.2 05/06/2018   INSULIN 8.4 01/23/2018   Lab Results  Component Value Date   TSH 0.697 02/16/2022   Lab Results  Component Value Date   CHOL 220 (H) 02/16/2022   HDL 82 02/16/2022   LDLCALC 125 (H) 02/16/2022   TRIG 76 02/16/2022   CHOLHDL 2.7 07/07/2021   Lab Results  Component Value Date   VD25OH 54.5 02/16/2022   VD25OH 60.7  07/07/2021   VD25OH 44.8 02/02/2021   Lab Results  Component Value Date   WBC 7.1 02/21/2021   HGB 12.8 07/27/2021   HCT 38.2 07/27/2021   MCV 91.0 02/21/2021   PLT 355.0 02/21/2021   Lab Results  Component Value Date   FERRITIN 169 05/21/2019   Attestation Statements:   Reviewed by clinician on day of visit: allergies, medications, problem list, medical history, surgical history, family history, social history, and previous encounter notes.  Time spent on visit including pre-visit chart review and post-visit care and charting was 32 minutes.   I, Trixie Dredge, am acting as transcriptionist for Dennard Nip, MD.  I have reviewed the above documentation for accuracy and completeness, and I agree with the above. -  ***

## 2022-03-21 ENCOUNTER — Encounter: Payer: Self-pay | Admitting: Neurology

## 2022-03-27 ENCOUNTER — Ambulatory Visit: Payer: BC Managed Care – PPO | Admitting: Neurology

## 2022-03-28 ENCOUNTER — Encounter: Payer: Self-pay | Admitting: Plastic Surgery

## 2022-03-28 ENCOUNTER — Ambulatory Visit (INDEPENDENT_AMBULATORY_CARE_PROVIDER_SITE_OTHER): Payer: BC Managed Care – PPO | Admitting: Plastic Surgery

## 2022-03-28 DIAGNOSIS — R1909 Other intra-abdominal and pelvic swelling, mass and lump: Secondary | ICD-10-CM | POA: Diagnosis not present

## 2022-03-28 NOTE — Progress Notes (Signed)
   Subjective:    Patient ID: Morgan Long, female    DOB: 06/21/1970, 51 y.o.   MRN: [id number]  Patient is a 51 year old female here for evaluation of her right groin.  The patient accidentally bumped into something very firmly and hit a table.  Very shortly after she developed a large knot at the area of the right groin.  It has not gotten smaller and is slightly tender.  She was concerned it was a growing lipoma.  This happened 3 weeks ago.  She is 5 feet 3 inches tall and weighs 209 pounds.  She is otherwise in good health.  The area is approximately 3 x 5 cm in size and firm not very mobile.  There is no bruising or opening of the skin.      Review of Systems  Constitutional: Negative.   Eyes: Negative.   Respiratory: Negative.  Negative for chest tightness and shortness of breath.   Cardiovascular: Negative.   Gastrointestinal: Negative.   Endocrine: Negative.   Genitourinary: Negative.   Musculoskeletal: Negative.   Skin: Negative.        Objective:   Physical Exam Constitutional:      Appearance: Normal appearance.  HENT:     Head: Normocephalic and atraumatic.  Cardiovascular:     Rate and Rhythm: Normal rate.     Pulses: Normal pulses.  Abdominal:     General: There is no distension.     Palpations: Abdomen is soft.    Skin:    Coloration: Skin is not jaundiced or pale.     Findings: Lesion present. No bruising or erythema.  Neurological:     Mental Status: She is alert and oriented to person, place, and time.  Psychiatric:        Mood and Affect: Mood normal.        Behavior: Behavior normal.        Thought Content: Thought content normal.        Assessment & Plan:     ICD-10-CM   1. Right groin mass  R19.09       Recommend waiting a few months to see if there is any improvement in the area before moving to any surgical intervention.  If it does not get any smaller then I would consider ultrasounding the area to make sure it did not create  herniation.  Patient is in agreement with plan and will come and see [name] in December.  Pictures were obtained of the patient and placed in the chart with the patient's or guardian's permission.

## 2022-04-05 ENCOUNTER — Ambulatory Visit (INDEPENDENT_AMBULATORY_CARE_PROVIDER_SITE_OTHER): Payer: BC Managed Care – PPO | Admitting: Neurology

## 2022-04-05 VITALS — BP 120/74 | HR 84 | Ht 63.0 in | Wt 209.0 lb

## 2022-04-05 DIAGNOSIS — G43709 Chronic migraine without aura, not intractable, without status migrainosus: Secondary | ICD-10-CM | POA: Diagnosis not present

## 2022-04-05 MED ORDER — ONABOTULINUMTOXINA 200 UNITS IJ SOLR
200.0000 [IU] | Freq: Once | INTRAMUSCULAR | Status: AC
  Administered 2022-04-05: 200 [IU] via INTRAMUSCULAR

## 2022-04-05 NOTE — Progress Notes (Signed)
Botox 200 units x 1 vial [id number] [id number] Exp-07/2024 B/B

## 2022-04-05 NOTE — Progress Notes (Signed)
     BOTOX PROCEDURE NOTE FOR MIGRAINE HEADACHE   HISTORY: Morgan Long is here today for Botox injection for chronic migraine headache.  Last injection was 01/03/22 with Dr. Rexene Alberts.  Continues to have 80% improvement with Botox.  She did receive cosmetic Botox 03/20/22 52 units around her eyebrows, eyes.   Description of procedure:  The patient was placed in a sitting position. The standard protocol was used for Botox as follows, with 5 units of Botox injected at each site:  -Procerus, corrugator injections were deferred given recent cosmetic Botox  -Frontalis muscle, bilateral injection, with 2 sites each side, medial injection was performed in the upper one third of the frontalis muscle, in the region vertical from the medial inferior edge of the superior orbital rim. The lateral injection was again in the upper one third of the forehead vertically above the lateral limbus of the cornea, 1.5 cm lateral to the medial injection site.   -Temporalis muscle injection, 4 sites, bilaterally. The first injection was 3 cm above the tragus of the ear, second injection site was 1.5 cm to 3 cm up from the first injection site in line with the tragus of the ear. The third injection site was 1.5-3 cm forward between the first 2 injection sites. The fourth injection site was 1.5 cm posterior to the second injection site.  -Occipitalis muscle injection, 3 sites, bilaterally. The first injection was done one half way between the occipital protuberance and the tip of the mastoid process behind the ear. The second injection site was done lateral and superior to the first, 1 fingerbreadth from the first injection. The third injection site was 1 fingerbreadth superiorly and medially from the first injection site.  -Cervical paraspinal muscle injection, 2 sites, bilateral, the first injection site was 1 cm from the midline of the cervical spine, 3 cm inferior to the lower border of the occipital protuberance. The  second injection site was 1.5 cm superiorly and laterally to the first injection site.  -Trapezius muscle injection was performed at 3 sites, bilaterally. The first injection site was in the upper trapezius muscle halfway between the inflection point of the neck, and the acromion. The second injection site was one half way between the acromion and the first injection site. The third injection was done between the first injection site and the inflection point of the neck.  A 200 unit bottle of Botox was used, 140 units were injected, the rest of the Botox was wasted. The patient tolerated the procedure well, there were no complications of the above procedure.  Botox NDC 0300-9233-00 Lot number [id number] Expiration date 07/2024 B/B

## 2022-04-12 ENCOUNTER — Encounter (INDEPENDENT_AMBULATORY_CARE_PROVIDER_SITE_OTHER): Payer: Self-pay | Admitting: Family Medicine

## 2022-04-12 ENCOUNTER — Ambulatory Visit (INDEPENDENT_AMBULATORY_CARE_PROVIDER_SITE_OTHER): Payer: BC Managed Care – PPO | Admitting: Family Medicine

## 2022-04-12 VITALS — BP 118/73 | HR 97 | Temp 98.6°F | Ht 63.0 in | Wt 207.0 lb

## 2022-04-12 DIAGNOSIS — R7303 Prediabetes: Secondary | ICD-10-CM | POA: Diagnosis not present

## 2022-04-12 DIAGNOSIS — Z6836 Body mass index (BMI) 36.0-36.9, adult: Secondary | ICD-10-CM | POA: Diagnosis not present

## 2022-04-12 DIAGNOSIS — E669 Obesity, unspecified: Secondary | ICD-10-CM | POA: Diagnosis not present

## 2022-04-12 DIAGNOSIS — E559 Vitamin D deficiency, unspecified: Secondary | ICD-10-CM | POA: Diagnosis not present

## 2022-04-12 MED ORDER — RYBELSUS 3 MG PO TABS: 1.0000 | ORAL_TABLET | Freq: Every day | ORAL | 0 refills | Status: DC

## 2022-04-13 ENCOUNTER — Encounter (INDEPENDENT_AMBULATORY_CARE_PROVIDER_SITE_OTHER): Payer: Self-pay | Admitting: Family Medicine

## 2022-04-20 DIAGNOSIS — L219 Seborrheic dermatitis, unspecified: Secondary | ICD-10-CM | POA: Diagnosis not present

## 2022-04-20 DIAGNOSIS — H019 Unspecified inflammation of eyelid: Secondary | ICD-10-CM | POA: Diagnosis not present

## 2022-04-20 DIAGNOSIS — Z872 Personal history of diseases of the skin and subcutaneous tissue: Secondary | ICD-10-CM | POA: Diagnosis not present

## 2022-04-25 NOTE — Progress Notes (Signed)
Chief Complaint:   OBESITY Morgan Long is here to discuss her progress with her obesity treatment plan along with follow-up of her obesity related diagnoses. Morgan Long is on the Category 2 Plan and states she is following her eating plan approximately 45% of the time. Morgan Long states she is doing cardio and weight lifting for 60 minutes 3 times per week.  Today's visit was #: 37 Starting weight: 206 lbs Starting date: 02/02/2021 Today's weight: 207 lbs Today's date: 04/12/2022 Total lbs lost to date: 0 Total lbs lost since last in-office visit: 0  Interim History: Morgan Long is struggling with weight loss. She did well on GLP-1 in the past. She is struggling to follow her plan.   Subjective:   1. Prediabetes We discussed options to help with her weight loss and appetite control.   2. Vitamin D deficiency Morgan Long is taking Vitamin D prescription with no side effects noted.   Assessment/Plan:   1. Prediabetes Morgan Long agreed to start Rybelsus 3 mg once daily with no refills. She will continue with her diet and exercise.   - Semaglutide (RYBELSUS) 3 MG TABS; Take 1 tablet by mouth daily.  Dispense: 30 tablet; Refill: 0  2. Vitamin D deficiency Morgan Long will continue prescription Vitamin D 50,000 IU every week and will follow-up for routine testing of Vitamin D, at least 2-3 times per year to avoid over-replacement.  3. Obesity, Current BMI 36.8 Morgan Long is currently in the action stage of change. As such, her goal is to continue with weight loss efforts. She has agreed to the Category 2 Plan.   Exercise goals: As is.   Behavioral modification strategies: increasing lean protein intake, decreasing simple carbohydrates, and holiday eating strategies .  Morgan Long has agreed to follow-up with our clinic in 4 weeks. She was informed of the importance of frequent follow-up visits to maximize her success with intensive lifestyle modifications for her multiple health conditions.   Objective:   Blood pressure 118/73,  pulse 97, temperature 98.6 F (37 C), height '5\' 3"'$  (1.6 m), weight 207 lb (93.9 kg), SpO2 99 %. Body mass index is 36.67 kg/m.  General: Cooperative, alert, well developed, in no acute distress. HEENT: Conjunctivae and lids unremarkable. Cardiovascular: Regular rhythm.  Lungs: Normal work of breathing. Neurologic: No focal deficits.   Lab Results  Component Value Date   CREATININE 0.74 02/16/2022   BUN 14 02/16/2022   NA 138 02/16/2022   K 4.2 02/16/2022   CL 99 02/16/2022   CO2 22 02/16/2022   Lab Results  Component Value Date   ALT 28 02/16/2022   AST 20 02/16/2022   ALKPHOS 107 02/16/2022   BILITOT 0.3 02/16/2022   Lab Results  Component Value Date   HGBA1C 5.8 (H) 02/16/2022   HGBA1C 5.9 (H) 07/07/2021   HGBA1C 5.9 (H) 02/02/2021   HGBA1C 5.9 (H) 05/06/2018   HGBA1C 5.8 (H) 01/23/2018   Lab Results  Component Value Date   INSULIN 22.3 02/16/2022   INSULIN 14.2 07/07/2021   INSULIN 12.8 02/02/2021   INSULIN 11.2 05/06/2018   INSULIN 8.4 01/23/2018   Lab Results  Component Value Date   TSH 0.697 02/16/2022   Lab Results  Component Value Date   CHOL 220 (H) 02/16/2022   HDL 82 02/16/2022   LDLCALC 125 (H) 02/16/2022   TRIG 76 02/16/2022   CHOLHDL 2.7 07/07/2021   Lab Results  Component Value Date   VD25OH 54.5 02/16/2022   VD25OH 60.7 07/07/2021   VD25OH 44.8  02/02/2021   Lab Results  Component Value Date   WBC 7.1 02/21/2021   HGB 12.8 07/27/2021   HCT 38.2 07/27/2021   MCV 91.0 02/21/2021   PLT 355.0 02/21/2021   Lab Results  Component Value Date   FERRITIN 169 05/21/2019   Attestation Statements:   Reviewed by clinician on day of visit: allergies, medications, problem list, medical history, surgical history, family history, social history, and previous encounter notes.   I, Trixie Dredge, am acting as transcriptionist for Dennard Nip, MD.  I have reviewed the above documentation for accuracy and completeness, and I agree with the  above. -  Dennard Nip, MD

## 2022-05-05 ENCOUNTER — Ambulatory Visit: Payer: BC Managed Care – PPO | Admitting: Surgical

## 2022-05-09 ENCOUNTER — Ambulatory Visit (INDEPENDENT_AMBULATORY_CARE_PROVIDER_SITE_OTHER): Payer: BC Managed Care – PPO | Admitting: Surgical

## 2022-05-09 ENCOUNTER — Encounter: Payer: Self-pay | Admitting: Surgical

## 2022-05-09 VITALS — BP 120/79 | HR 84

## 2022-05-09 DIAGNOSIS — R2241 Localized swelling, mass and lump, right lower limb: Secondary | ICD-10-CM

## 2022-05-09 NOTE — Progress Notes (Signed)
   Referring Provider Carollee Herter, Yvonne R, DO [address] [place],  [place] [address]   CC:  Chief Complaint  Patient presents with   Follow-up      Morgan Long is an 51 y.o. female.  HPI: Patient is a 51 year old female here for evaluation of her right groin.  She initially saw Dr. Marla Roe on 03/28/2022.  She bumped her right groin area into a table.  The initial injury was early October.  The approximate area of concern at that point was 3 x 5 cm, firm, not mobile.  She is here today for reevaluation of the area.  Patient reports no improvement in the area.  She reports ongoing tenderness.  She does not have any overlying skin changes at this time.  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  There is no overlying skin changes.  It is mildly tender to palpation.  Nonmobile.  Assessment/Plan Patient with right thigh mass that is present after bumping the area into a table early October.  It is unchanged since then.  It is still tender to palpation.  Recommend ultrasound to evaluate the area for hematoma versus seroma versus fat necrosis.  We will plan to set up a telephone visit after patient has ultrasound scheduled and completed to discuss results and discuss options moving forward.   Carola Rhine Mussa Groesbeck 05/09/2022, 3:52 PM

## 2022-05-10 ENCOUNTER — Other Ambulatory Visit (HOSPITAL_COMMUNITY): Payer: Self-pay

## 2022-05-10 ENCOUNTER — Encounter (INDEPENDENT_AMBULATORY_CARE_PROVIDER_SITE_OTHER): Payer: Self-pay | Admitting: Family Medicine

## 2022-05-10 ENCOUNTER — Encounter (HOSPITAL_COMMUNITY): Payer: Self-pay

## 2022-05-10 ENCOUNTER — Ambulatory Visit (INDEPENDENT_AMBULATORY_CARE_PROVIDER_SITE_OTHER): Payer: BC Managed Care – PPO | Admitting: Family Medicine

## 2022-05-10 VITALS — BP 114/74 | HR 94 | Temp 98.9°F | Ht 63.0 in | Wt 208.0 lb

## 2022-05-10 DIAGNOSIS — E669 Obesity, unspecified: Secondary | ICD-10-CM

## 2022-05-10 DIAGNOSIS — Z6837 Body mass index (BMI) 37.0-37.9, adult: Secondary | ICD-10-CM

## 2022-05-10 DIAGNOSIS — R7303 Prediabetes: Secondary | ICD-10-CM | POA: Diagnosis not present

## 2022-05-10 MED ORDER — RYBELSUS 3 MG PO TABS
1.0000 | ORAL_TABLET | Freq: Every day | ORAL | 0 refills | Status: DC
  Filled 2022-05-10: qty 30, 30d supply, fill #0

## 2022-05-11 DIAGNOSIS — Z872 Personal history of diseases of the skin and subcutaneous tissue: Secondary | ICD-10-CM | POA: Diagnosis not present

## 2022-05-11 DIAGNOSIS — H019 Unspecified inflammation of eyelid: Secondary | ICD-10-CM | POA: Diagnosis not present

## 2022-05-12 ENCOUNTER — Encounter (INDEPENDENT_AMBULATORY_CARE_PROVIDER_SITE_OTHER): Payer: Self-pay | Admitting: Family Medicine

## 2022-05-15 DIAGNOSIS — N760 Acute vaginitis: Secondary | ICD-10-CM | POA: Diagnosis not present

## 2022-05-16 ENCOUNTER — Encounter: Payer: Self-pay | Admitting: Family Medicine

## 2022-05-16 MED ORDER — "SYRINGE/NEEDLE (DISP) 23G X 1"" 3 ML MISC": 2 refills | Status: DC

## 2022-05-16 MED ORDER — "BD DISP NEEDLE 25G X 1"" MISC": 2 refills | Status: DC

## 2022-05-22 ENCOUNTER — Other Ambulatory Visit: Payer: BC Managed Care – PPO

## 2022-05-22 NOTE — Progress Notes (Unsigned)
Chief Complaint:   OBESITY Morgan Long is here to discuss her progress with her obesity treatment plan along with follow-up of her obesity related diagnoses. Morgan Long is on the Category 2 Plan and states she is following her eating plan approximately 75% of the time. Morgan Long states she is walking, doing cardio, and strengthening for 60 minutes 3 times per week.  Today's visit was #: 20 Starting weight: 206 lbs Starting date: 02/02/2021 Today's weight: 208 lbs Today's date: 05/10/2022 Total lbs lost to date: 0 Total lbs lost since last in-office visit: 0  Interim History: Morgan Long did well with avoiding excessive holiday weight gain over Thanksgiving. She is doing well with meeting her protein goals.   Subjective:   1. Prediabetes Morgan Long didn't get Rybelsus from [facility], who never contacted her that it was available.   Assessment/Plan:   1. Prediabetes We will send Rybelsus 3 mg to [facility] for a 30 days supply. Prior authorization likely needed.   - Semaglutide (RYBELSUS) 3 MG TABS; Take 1 tablet by mouth daily.  Dispense: 30 tablet; Refill: 0  2. Obesity, Current BMI 37.0 [name] is currently in the action stage of change. As such, her goal is to continue with weight loss efforts. She has agreed to the Category 2 Plan.   Exercise goals: As is.   Behavioral modification strategies: increasing lean protein intake and holiday eating strategies .  Morgan Long has agreed to follow-up with our clinic in 5 weeks. She was informed of the importance of frequent follow-up visits to maximize her success with intensive lifestyle modifications for her multiple health conditions.   Objective:   Blood pressure 114/74, pulse 94, temperature 98.9 F (37.2 C), height '5\' 3"'$  (1.6 m), weight 208 lb (94.3 kg), SpO2 99 %. Body mass index is 36.85 kg/m.  General: Cooperative, alert, well developed, in no acute distress. HEENT: Conjunctivae and lids unremarkable. Cardiovascular: Regular rhythm.  Lungs: Normal  work of breathing. Neurologic: No focal deficits.   Lab Results  Component Value Date   CREATININE 0.74 02/16/2022   BUN 14 02/16/2022   NA 138 02/16/2022   K 4.2 02/16/2022   CL 99 02/16/2022   CO2 22 02/16/2022   Lab Results  Component Value Date   ALT 28 02/16/2022   AST 20 02/16/2022   ALKPHOS 107 02/16/2022   BILITOT 0.3 02/16/2022   Lab Results  Component Value Date   HGBA1C 5.8 (H) 02/16/2022   HGBA1C 5.9 (H) 07/07/2021   HGBA1C 5.9 (H) 02/02/2021   HGBA1C 5.9 (H) 05/06/2018   HGBA1C 5.8 (H) 01/23/2018   Lab Results  Component Value Date   INSULIN 22.3 02/16/2022   INSULIN 14.2 07/07/2021   INSULIN 12.8 02/02/2021   INSULIN 11.2 05/06/2018   INSULIN 8.4 01/23/2018   Lab Results  Component Value Date   TSH 0.697 02/16/2022   Lab Results  Component Value Date   CHOL 220 (H) 02/16/2022   HDL 82 02/16/2022   LDLCALC 125 (H) 02/16/2022   TRIG 76 02/16/2022   CHOLHDL 2.7 07/07/2021   Lab Results  Component Value Date   VD25OH 54.5 02/16/2022   VD25OH 60.7 07/07/2021   VD25OH 44.8 02/02/2021   Lab Results  Component Value Date   WBC 7.1 02/21/2021   HGB 12.8 07/27/2021   HCT 38.2 07/27/2021   MCV 91.0 02/21/2021   PLT 355.0 02/21/2021   Lab Results  Component Value Date   FERRITIN 169 05/21/2019   Attestation Statements:  Reviewed by clinician on day of visit: allergies, medications, problem list, medical history, surgical history, family history, social history, and previous encounter notes.  Time spent on visit including pre-visit chart review and post-visit care and charting was 30 minutes.   I, Trixie Dredge, am acting as transcriptionist for Dennard Nip, MD.  I have reviewed the above documentation for accuracy and completeness, and I agree with the above. -  Dennard Nip, MD

## 2022-05-24 DIAGNOSIS — Z79899 Other long term (current) drug therapy: Secondary | ICD-10-CM | POA: Diagnosis not present

## 2022-05-24 DIAGNOSIS — L700 Acne vulgaris: Secondary | ICD-10-CM | POA: Diagnosis not present

## 2022-05-24 DIAGNOSIS — L820 Inflamed seborrheic keratosis: Secondary | ICD-10-CM | POA: Diagnosis not present

## 2022-05-25 ENCOUNTER — Ambulatory Visit
Admission: RE | Admit: 2022-05-25 | Discharge: 2022-05-25 | Disposition: A | Discharge status: Home / Self Care | Payer: BC Managed Care – PPO | Source: Ambulatory Visit | Attending: Surgical | Admitting: Surgical

## 2022-05-25 DIAGNOSIS — S7011XA Contusion of right thigh, initial encounter: Secondary | ICD-10-CM | POA: Diagnosis not present

## 2022-05-25 DIAGNOSIS — R2241 Localized swelling, mass and lump, right lower limb: Secondary | ICD-10-CM

## 2022-06-01 ENCOUNTER — Encounter: Payer: Self-pay | Admitting: *Deleted

## 2022-06-06 ENCOUNTER — Telehealth (INDEPENDENT_AMBULATORY_CARE_PROVIDER_SITE_OTHER): Payer: BC Managed Care – PPO | Admitting: Surgical

## 2022-06-06 ENCOUNTER — Encounter: Payer: Self-pay | Admitting: Surgical

## 2022-06-06 DIAGNOSIS — R2241 Localized swelling, mass and lump, right lower limb: Secondary | ICD-10-CM | POA: Diagnosis not present

## 2022-06-06 NOTE — Progress Notes (Addendum)
   Referring Provider Carollee Herter, Yvonne R, DO [address] [place],  [place] [address]   CC: No chief complaint on file.     Morgan Long is an 52 y.o. female.  HPI: Patient is a 52 year old female here via video visit to discuss recent ultrasound of her right lower extremity.  She initially saw Dr. Marla Roe October 2023 after bumping into a table in her right groin area.  The injury was early October, the area since then has been unchanged and she has pain to this area as well as fullness and a 3 x 6 cm mass.  She had an ultrasound on 05/25/2022 which did not show any suspicious cystic or solid mass lesions, no fluid collections noted.  The patient gave consent to have this visit done by telemedicine / virtual visit, two identifiers were used to identify patient. This is also consent for access the chart and treat the patient via this visit. The patient is located at home.  I, the provider, am at the office.  We spent 5 minutes together for the visit.  Joined by video visit.   Review of Systems MSK: Positive tenderness right thigh  Physical Exam    05/10/2022    4:00 PM 05/09/2022    3:17 PM 04/12/2022   12:00 PM  Vitals with BMI  Height '5\' 3"'$   '5\' 3"'$   Weight 208 lbs  207 lbs  BMI 42.35  36.14  Systolic 431 540 086  Diastolic 74 79 73  Pulse 94 84 97    General:  No acute distress,  Alert and oriented, Non-Toxic, Normal speech and affect   Assessment/Plan 52 year old female who presents via televideo visit to discuss recent ultrasound results.  We discussed the results.  We discussed that despite the ultrasound results we can still plan for possible excision of the area due to ongoing tenderness and the bulging that is present.  Patient's case was previously discussed with Dr. Marla Roe.  We will plan to submit to insurance for excision of right thigh mass.  Morgan Long 06/06/2022, 10:08 AM

## 2022-06-12 ENCOUNTER — Other Ambulatory Visit (HOSPITAL_COMMUNITY): Payer: Self-pay

## 2022-06-15 ENCOUNTER — Encounter (INDEPENDENT_AMBULATORY_CARE_PROVIDER_SITE_OTHER): Payer: Self-pay | Admitting: Family Medicine

## 2022-06-15 ENCOUNTER — Ambulatory Visit (INDEPENDENT_AMBULATORY_CARE_PROVIDER_SITE_OTHER): Payer: BC Managed Care – PPO | Admitting: Family Medicine

## 2022-06-15 VITALS — BP 108/74 | HR 90 | Temp 98.2°F | Ht 63.0 in | Wt 204.0 lb

## 2022-06-15 DIAGNOSIS — Z6836 Body mass index (BMI) 36.0-36.9, adult: Secondary | ICD-10-CM

## 2022-06-15 DIAGNOSIS — E669 Obesity, unspecified: Secondary | ICD-10-CM

## 2022-06-15 DIAGNOSIS — R632 Polyphagia: Secondary | ICD-10-CM | POA: Diagnosis not present

## 2022-06-15 MED ORDER — LOMAIRA 8 MG PO TABS: 8.0000 mg | ORAL_TABLET | Freq: Every day | ORAL | 0 refills | Status: DC

## 2022-06-19 ENCOUNTER — Other Ambulatory Visit (HOSPITAL_COMMUNITY): Payer: Self-pay

## 2022-06-21 ENCOUNTER — Encounter (INDEPENDENT_AMBULATORY_CARE_PROVIDER_SITE_OTHER): Payer: Self-pay | Admitting: Family Medicine

## 2022-06-21 NOTE — Telephone Encounter (Signed)
ERROR--TO CLOSE 

## 2022-06-22 ENCOUNTER — Encounter: Payer: Self-pay | Admitting: Family Medicine

## 2022-06-26 NOTE — Progress Notes (Unsigned)
Chief Complaint:   OBESITY Morgan Long is here to discuss her progress with her obesity treatment plan along with follow-up of her obesity related diagnoses. Morgan Long is on the Category 2 Plan and states she is following her eating plan approximately 85% of the time. Morgan Long states she is walking and lifting weights for 60 minutes 4 times per week.  Today's visit was #: 21 Starting weight: 206 lbs Starting date: 02/02/2021 Today's weight: 204 lbs Today's date: 06/15/2022 Total lbs lost to date: 2 Total lbs lost since last in-office visit: 4  Interim History: Morgan Long is doing better with portion control over the holidays.  She is working on increasing her exercise.  Subjective:   1. Polyphagia Morgan Long would like to start back to phentermine at a lower dose.  She has been on a higher dose, but she had to take Phenergan for nausea and insomnia.  Assessment/Plan:   1. Polyphagia Morgan Long agreed to start Lomaira 8 mg every morning with no refills.  She was informed that Phenergan for side effects of Lomaira is contraindicated and delays the weight loss.  - Phentermine HCl (LOMAIRA) 8 MG TABS; Take 8 mg by mouth daily.  Dispense: 28 tablet; Refill: 0  2. Obesity, Current BMI 36.2 Morgan Long is currently in the action stage of change. As such, her goal is to continue with weight loss efforts. She has agreed to practicing portion control and making smarter food choices, such as increasing vegetables and decreasing simple carbohydrates.   Exercise goals: As is.   Behavioral modification strategies: increasing lean protein intake.  Morgan Long has agreed to follow-up with our clinic in 4 weeks. She was informed of the importance of frequent follow-up visits to maximize her success with intensive lifestyle modifications for her multiple health conditions.   Objective:   Blood pressure 108/74, pulse 90, temperature 98.2 F (36.8 C), height '5\' 3"'$  (1.6 m), weight 204 lb (92.5 kg), SpO2 96 %. Body mass index is 36.14  kg/m.  General: Cooperative, alert, well developed, in no acute distress. HEENT: Conjunctivae and lids unremarkable. Cardiovascular: Regular rhythm.  Lungs: Normal work of breathing. Neurologic: No focal deficits.   Lab Results  Component Value Date   CREATININE 0.74 02/16/2022   BUN 14 02/16/2022   NA 138 02/16/2022   K 4.2 02/16/2022   CL 99 02/16/2022   CO2 22 02/16/2022   Lab Results  Component Value Date   ALT 28 02/16/2022   AST 20 02/16/2022   ALKPHOS 107 02/16/2022   BILITOT 0.3 02/16/2022   Lab Results  Component Value Date   HGBA1C 5.8 (H) 02/16/2022   HGBA1C 5.9 (H) 07/07/2021   HGBA1C 5.9 (H) 02/02/2021   HGBA1C 5.9 (H) 05/06/2018   HGBA1C 5.8 (H) 01/23/2018   Lab Results  Component Value Date   INSULIN 22.3 02/16/2022   INSULIN 14.2 07/07/2021   INSULIN 12.8 02/02/2021   INSULIN 11.2 05/06/2018   INSULIN 8.4 01/23/2018   Lab Results  Component Value Date   TSH 0.697 02/16/2022   Lab Results  Component Value Date   CHOL 220 (H) 02/16/2022   HDL 82 02/16/2022   LDLCALC 125 (H) 02/16/2022   TRIG 76 02/16/2022   CHOLHDL 2.7 07/07/2021   Lab Results  Component Value Date   VD25OH 54.5 02/16/2022   VD25OH 60.7 07/07/2021   VD25OH 44.8 02/02/2021   Lab Results  Component Value Date   WBC 7.1 02/21/2021   HGB 12.8 07/27/2021   HCT 38.2 07/27/2021  MCV 91.0 02/21/2021   PLT 355.0 02/21/2021   Lab Results  Component Value Date   FERRITIN 169 05/21/2019   Attestation Statements:   Reviewed by clinician on day of visit: allergies, medications, problem list, medical history, surgical history, family history, social history, and previous encounter notes.   I, Trixie Dredge, am acting as transcriptionist for Dennard Nip, MD.  I have reviewed the above documentation for accuracy and completeness, and I agree with the above. -  Dennard Nip, MD

## 2022-06-28 ENCOUNTER — Ambulatory Visit: Payer: BC Managed Care – PPO | Admitting: Neurology

## 2022-07-05 ENCOUNTER — Encounter: Payer: Self-pay | Admitting: Family Medicine

## 2022-07-10 DIAGNOSIS — Z79899 Other long term (current) drug therapy: Secondary | ICD-10-CM | POA: Diagnosis not present

## 2022-07-10 DIAGNOSIS — L658 Other specified nonscarring hair loss: Secondary | ICD-10-CM | POA: Diagnosis not present

## 2022-07-10 DIAGNOSIS — L219 Seborrheic dermatitis, unspecified: Secondary | ICD-10-CM | POA: Diagnosis not present

## 2022-07-10 DIAGNOSIS — Z5181 Encounter for therapeutic drug level monitoring: Secondary | ICD-10-CM | POA: Diagnosis not present

## 2022-07-10 DIAGNOSIS — L409 Psoriasis, unspecified: Secondary | ICD-10-CM | POA: Diagnosis not present

## 2022-07-11 ENCOUNTER — Other Ambulatory Visit (HOSPITAL_COMMUNITY): Payer: Self-pay

## 2022-07-11 ENCOUNTER — Encounter (INDEPENDENT_AMBULATORY_CARE_PROVIDER_SITE_OTHER): Payer: Self-pay | Admitting: Family Medicine

## 2022-07-11 ENCOUNTER — Ambulatory Visit (INDEPENDENT_AMBULATORY_CARE_PROVIDER_SITE_OTHER): Payer: BC Managed Care – PPO | Admitting: Family Medicine

## 2022-07-11 VITALS — BP 119/74 | HR 91 | Temp 98.6°F | Ht 63.0 in | Wt 205.0 lb

## 2022-07-11 DIAGNOSIS — Z6839 Body mass index (BMI) 39.0-39.9, adult: Secondary | ICD-10-CM

## 2022-07-11 DIAGNOSIS — E669 Obesity, unspecified: Secondary | ICD-10-CM

## 2022-07-11 DIAGNOSIS — Z6836 Body mass index (BMI) 36.0-36.9, adult: Secondary | ICD-10-CM

## 2022-07-11 DIAGNOSIS — E559 Vitamin D deficiency, unspecified: Secondary | ICD-10-CM | POA: Diagnosis not present

## 2022-07-11 MED ORDER — ZEPBOUND 2.5 MG/0.5ML ~~LOC~~ SOAJ: 2.5000 mg | SUBCUTANEOUS | 0 refills | Status: DC

## 2022-07-11 NOTE — Progress Notes (Deleted)
Patient has been mostly working.  Her weight has gone up since getting off Ozempic.  She mentions she was wondering about taking phentermine tablets instead of capsules to be able to break them in half to help with the headaches she is getting from the medication.  She has previously taken contrave and saxenda but couldn't tolerate due to side effects.  She is interested in Zepbound.  Really wants to get under the 200lbs.   She is focusing on protein amount.  Wants to come up with other protein methods besides steak.  Sometimes she forgets to eat and then eats a big meal at night.  Struggles to get all meals in daily.

## 2022-07-12 ENCOUNTER — Ambulatory Visit (INDEPENDENT_AMBULATORY_CARE_PROVIDER_SITE_OTHER): Payer: BC Managed Care – PPO | Admitting: Family Medicine

## 2022-07-12 ENCOUNTER — Ambulatory Visit: Payer: BC Managed Care – PPO | Admitting: Neurology

## 2022-07-12 ENCOUNTER — Telehealth: Payer: Self-pay | Admitting: Plastic Surgery

## 2022-07-12 ENCOUNTER — Encounter (INDEPENDENT_AMBULATORY_CARE_PROVIDER_SITE_OTHER): Payer: Self-pay | Admitting: Family Medicine

## 2022-07-12 ENCOUNTER — Encounter: Payer: Self-pay | Admitting: Neurology

## 2022-07-12 NOTE — Telephone Encounter (Signed)
TRLVM READY TO SCHEDULE NO AUTH REQD

## 2022-07-13 ENCOUNTER — Telehealth: Payer: Self-pay | Admitting: *Deleted

## 2022-07-13 ENCOUNTER — Encounter (INDEPENDENT_AMBULATORY_CARE_PROVIDER_SITE_OTHER): Payer: Self-pay

## 2022-07-13 NOTE — Telephone Encounter (Signed)
LVM to schedule surgery 

## 2022-07-14 ENCOUNTER — Telehealth: Payer: BC Managed Care – PPO | Admitting: Physician Assistant

## 2022-07-14 ENCOUNTER — Encounter: Payer: Self-pay | Admitting: Family Medicine

## 2022-07-14 DIAGNOSIS — J069 Acute upper respiratory infection, unspecified: Secondary | ICD-10-CM

## 2022-07-14 MED ORDER — FLUTICASONE PROPIONATE 50 MCG/ACT NA SUSP: 2.0000 | Freq: Every day | NASAL | 0 refills | Status: DC

## 2022-07-14 MED ORDER — PROMETHAZINE-DM 6.25-15 MG/5ML PO SYRP: 5.0000 mL | ORAL_SOLUTION | Freq: Four times a day (QID) | ORAL | 0 refills | Status: DC | PRN

## 2022-07-14 NOTE — Progress Notes (Signed)
E-Visit for Upper Respiratory Infection   We are sorry you are not feeling well.  Here is how we plan to help!  Based on what you have shared with me, it looks like you may have a viral upper respiratory infection.  Upper respiratory infections are caused by a large number of viruses; however, rhinovirus is the most common cause.   Symptoms vary from person to person, with common symptoms including sore throat, cough, fatigue or lack of energy and feeling of general discomfort.  A low-grade fever of up to 100.4 may present, but is often uncommon.  Symptoms vary however, and are closely related to a person's age or underlying illnesses.  The most common symptoms associated with an upper respiratory infection are nasal discharge or congestion, cough, sneezing, headache and pressure in the ears and face.  These symptoms usually persist for about 3 to 10 days, but can last up to 2 weeks.  It is important to know that upper respiratory infections do not cause serious illness or complications in most cases.    Upper respiratory infections can be transmitted from person to person, with the most common method of transmission being a person's hands.  The virus is able to live on the skin and can infect other persons for up to 2 hours after direct contact.  Also, these can be transmitted when someone coughs or sneezes; thus, it is important to cover the mouth to reduce this risk.  To keep the spread of the illness at [place], good hand hygiene is very important.  This is an infection that is most likely caused by a virus. There are no specific treatments other than to help you with the symptoms until the infection runs its course.  We are sorry you are not feeling well.  Here is how we plan to help!   For nasal congestion, you may use an oral decongestants such as Mucinex D or if you have glaucoma or high blood pressure use plain Mucinex.  Saline nasal spray or nasal drops can help and can safely be used as often as  needed for congestion.  For your congestion, I have prescribed Fluticasone nasal spray one spray in each nostril twice a day  If you do not have a history of heart disease, hypertension, diabetes or thyroid disease, prostate/bladder issues or glaucoma, you may also use Sudafed to treat nasal congestion.  It is highly recommended that you consult with a pharmacist or your primary care physician to ensure this medication is safe for you to take.     If you have a cough, you may use cough suppressants such as Delsym and Robitussin.  If you have glaucoma or high blood pressure, you can also use Coricidin HBP.   For cough I have prescribed for you Promethazine DM cough syrup Take 66m every 6-8 hours as needed for cough  If you have a sore or scratchy throat, use a saltwater gargle-  to  teaspoon of salt dissolved in a 4-ounce to 8-ounce glass of warm water.  Gargle the solution for approximately 15-30 seconds and then spit.  It is important not to swallow the solution.  You can also use throat lozenges/cough drops and Chloraseptic spray to help with throat pain or discomfort.  Warm or cold liquids can also be helpful in relieving throat pain.  For headache, pain or general discomfort, you can use Ibuprofen or Tylenol as directed.   Some authorities believe that zinc sprays or the use of Echinacea  may shorten the course of your symptoms.  It is recommended to take an at home Covid 19 test, if desired, as your symptoms can be consistent with Covid 19 infections.   HOME CARE Only take medications as instructed by your medical team. Be sure to drink plenty of fluids. Water is fine as well as fruit juices, sodas and electrolyte beverages. You may want to stay away from caffeine or alcohol. If you are nauseated, try taking small sips of liquids. How do you know if you are getting enough fluid? Your urine should be a pale yellow or almost colorless. Get rest. Taking a steamy shower or using a humidifier may  help nasal congestion and ease sore throat pain. You can place a towel over your head and breathe in the steam from hot water coming from a faucet. Using a saline nasal spray works much the same way. Cough drops, hard candies and sore throat lozenges may ease your cough. Avoid close contacts especially the very young and the elderly Cover your mouth if you cough or sneeze Always remember to wash your hands.   GET HELP RIGHT AWAY IF: You develop worsening fever. If your symptoms do not improve within 10 days You develop yellow or green discharge from your nose over 3 days. You have coughing fits You develop a severe head ache or visual changes. You develop shortness of breath, difficulty breathing or start having chest pain Your symptoms persist after you have completed your treatment plan  MAKE SURE YOU  Understand these instructions. Will watch your condition. Will get help right away if you are not doing well or get worse.  Thank you for choosing an e-visit.  Your e-visit answers were reviewed by a board certified advanced clinical practitioner to complete your personal care plan. Depending upon the condition, your plan could have included both over the counter or prescription medications.  Please review your pharmacy choice. Make sure the pharmacy is open so you can pick up prescription now. If there is a problem, you may contact your provider through [employer] and have the prescription routed to another pharmacy.  Your safety is important to [name]. If you have drug allergies check your prescription carefully.   For the next 24 hours you can use MyChart to ask questions about today's visit, request a non-urgent call back, or ask for a work or school excuse. You will get an email in the next two days asking about your experience. I hope that your e-visit has been valuable and will speed your recovery.  I have spent 5 minutes in review of e-visit questionnaire, review and updating  patient chart, medical decision making and response to patient.   Mar Daring, PA-C

## 2022-07-15 ENCOUNTER — Encounter: Payer: Self-pay | Admitting: Family Medicine

## 2022-07-16 ENCOUNTER — Encounter (INDEPENDENT_AMBULATORY_CARE_PROVIDER_SITE_OTHER): Payer: Self-pay | Admitting: Family Medicine

## 2022-07-17 ENCOUNTER — Encounter: Payer: Self-pay | Admitting: Family Medicine

## 2022-07-17 NOTE — Telephone Encounter (Signed)
Patient sent message to wrong doctors office

## 2022-07-17 NOTE — Telephone Encounter (Signed)
Pt had a e-visit on 02/09 and was given Promethazine. Please advise

## 2022-07-17 NOTE — Telephone Encounter (Signed)
Please reschedule appt

## 2022-07-18 ENCOUNTER — Encounter: Payer: Self-pay | Admitting: Family Medicine

## 2022-07-18 ENCOUNTER — Ambulatory Visit (INDEPENDENT_AMBULATORY_CARE_PROVIDER_SITE_OTHER): Payer: BC Managed Care – PPO | Admitting: Family Medicine

## 2022-07-18 VITALS — BP 110/70 | HR 104 | Temp 99.2°F | Resp 18 | Ht 63.0 in | Wt 209.6 lb

## 2022-07-18 DIAGNOSIS — R051 Acute cough: Secondary | ICD-10-CM | POA: Diagnosis not present

## 2022-07-18 DIAGNOSIS — U071 COVID-19: Secondary | ICD-10-CM | POA: Diagnosis not present

## 2022-07-18 LAB — POC COVID19 BINAXNOW: SARS Coronavirus 2 Ag: POSITIVE — AB

## 2022-07-18 LAB — POCT RAPID STREP A (OFFICE): Rapid Strep A Screen: NEGATIVE

## 2022-07-18 LAB — POC INFLUENZA A&B (BINAX/QUICKVUE)
Influenza A, POC: NEGATIVE
Influenza B, POC: NEGATIVE

## 2022-07-18 MED ORDER — NIRMATRELVIR/RITONAVIR (PAXLOVID)TABLET: 3.0000 | ORAL_TABLET | Freq: Two times a day (BID) | ORAL | 0 refills | Status: AC

## 2022-07-18 MED ORDER — AZITHROMYCIN 250 MG PO TABS: ORAL_TABLET | ORAL | 0 refills | Status: AC

## 2022-07-18 NOTE — Patient Instructions (Signed)

## 2022-07-18 NOTE — Telephone Encounter (Signed)
Appt w/ Dr. Etter Sjogren today

## 2022-07-18 NOTE — Progress Notes (Addendum)
Subjective:   By signing my name below, I, Morgan Long, attest that this documentation has been prepared under the direction and in the presence of [name], DO. 07/18/2022.    Patient ID: Morgan Long, female    DOB: 1971/02/10, 52 y.o.   MRN: [id number]  Chief Complaint  Patient presents with   Sinus Problem    HPI Patient is in today for an office visit.  New Illness: Her symptoms began on Friday 2/9. She had an E-visit that day and was given promethazine and fluticasone without improvement in her symptoms. Currently she complains of sore throat, cough, congestion, intermittent mild fever (99-101 degrees), and yellowish-brown sputum production. When breathing deeply, she notices her chest feels "weird and heavy". Additionally she suffers from back and chest aches associated with her breathing; she denies full-body aches. She states that her niece had felt achy one day, but was feeling fine the next day. She may have had a sick contact 1-2 weeks ago at work. Of note, she did not receive the last COVID shot due to concern of affecting her breast tissue. Testing performed today and was positive for COVID.  Denies having any new moles, palpitations, wheezing, n/v/d, constipation, blood in stool, dysuria, frequency, hematuria, at this time.  Past Medical History:  Diagnosis Date   Allergic rhinitis    Asthma    B12 deficiency    Chronic migraine    Dr. Catalina Gravel   Constipation    Dry eye syndrome of both lacrimal glands    Dry skin    Endometriosis    Fatigue    Gastroparesis    GERD (gastroesophageal reflux disease)    Headache(784.0)    occasional, dx w/ Migraines before, Topamax helps   Heartburn    Hepatic steatosis    History of stomach ulcers    Lactose intolerance    Localized edema    Migraine    Muscle weakness (generalized)    Nuclear cataract of both eyes    Mild   Overweight    Pain in right ankle and joints of right foot    Prediabetes     Shortness of breath    Sinus complaint    Sinusitis    Tired    TMJ pain dysfunction syndrome    occasional   Vitamin D deficiency     Past Surgical History:  Procedure Laterality Date   ANKLE SURGERY Left 11/16/2017   AUGMENTATION MAMMAPLASTY Bilateral 2006   BREAST ENHANCEMENT SURGERY  2006   BUNIONECTOMY     COLONOSCOPY     endrometroisis     fallopian tube removed     Left   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   UPPER GASTROINTESTINAL ENDOSCOPY     [facility] teeth extraction      Family History  Problem Relation Age of Onset   Throat cancer Mother    Cancer - Other Mother 64       throat - died 103 months   Cancer Mother    Alcoholism Mother    Stroke Father    Heart disease Father    Cancer Father    Colon cancer Paternal Grandfather    Breast cancer Maternal Aunt        breast   Breast cancer Maternal Aunt        breast   Pancreatic cancer Maternal Aunt    Hypertension Maternal Aunt  several family members   Breast cancer Maternal Aunt        total of 5 aunts   Breast cancer Maternal Aunt    Colon cancer Maternal Uncle 34       died 26   Throat cancer Maternal Uncle    Diabetes Other        grandmother   Asthma Other        cousin, maternal   Heart attack Neg Hx    Rectal cancer Neg Hx    Stomach cancer Neg Hx     Social History   Socioeconomic History   Marital status: Single    Spouse name: Not on file   Number of children: 0   Years of education: BS   Highest education level: Not on file  Occupational History   Occupation: Paramedic, going to school    Employer: [employer]   Occupation: Air traffic controller  Tobacco Use   Smoking status: Never    Passive exposure: Never   Smokeless tobacco: Never  Vaping Use   Vaping Use: Never used  Substance and Sexual Activity   Alcohol use: Not Currently    Comment: socially - occasional    Drug use: No   Sexual activity: Yes    Birth  control/protection: Inserts    Comment: nuvaring  Other Topics Concern   Not on file  Social History Narrative   Household:sister and her 3 kids    Drinks occasional [employer] drink       Social Determinants of Health   Financial Resource Strain: Not on file  Food Insecurity: Not on file  Transportation Needs: Not on file  Physical Activity: Not on file  Stress: Not on file  Social Connections: Not on file  Intimate Partner Violence: Not on file    Outpatient Medications Prior to Visit  Medication Sig Dispense Refill   albuterol (VENTOLIN HFA) 108 (90 Base) MCG/ACT inhaler UP TO 2 PUFFS EVERY 4 HOURS AS NEEDED 8.5 each 2   azelastine (OPTIVAR) 0.05 % ophthalmic solution Place 1 drop into both eyes 2 (two) times daily. 6 mL 12   azithromycin (ZITHROMAX) 250 MG tablet Take 2 tablets on day 1, then 1 tablet daily on days 2 through 5 6 tablet 0   cetirizine (ZYRTEC) 10 MG tablet TAKE 1 TABLET BY MOUTH DAILY 30 tablet 5   clindamycin (CLEOCIN T) 1 % external solution Apply topically 2 (two) times daily. 30 mL 5   cyanocobalamin (,VITAMIN B-12,) 1000 MCG/ML injection INJECT 1 ML (1,000 MCG TOTAL) INTO THE MUSCLE ONCE FOR 1 DOSE. 3 mL 3   ESTROGEL 0.75 MG/1.25 GM (0.06%) topical gel Apply 1 application topically daily.  0   etonogestrel-ethinyl estradiol (NUVARING) 0.12-0.015 MG/24HR vaginal ring Place 1 each vaginally every 28 (twenty-eight) days. Insert vaginally and leave in place for 3 consecutive weeks, then remove for 1 week.     fluticasone (FLONASE) 50 MCG/ACT nasal spray Place 2 sprays into both nostrils daily. 16 g 0   LINZESS 72 MCG capsule Take 72 mcg by mouth every morning.     Multiple Vitamin (MULTIVITAMIN) LIQD Take 5 mLs by mouth daily.     NEEDLE, DISP, 25 G (B-D DISP NEEDLE 25GX1") 25G X 1" MISC Switch needle and inject B12 as directed 50 each 2   PREVIDENT 5000 SENSITIVE 1.1-5 % PSTE USE BEFORE BED DO NOT RINSE     promethazine (PHENERGAN) 12.5 MG tablet Take 1 tablet  (12.5  mg total) by mouth every 6 (six) hours as needed for nausea or vomiting. 30 tablet 0   promethazine-dextromethorphan (PROMETHAZINE-DM) 6.25-15 MG/5ML syrup Take 5 mLs by mouth 4 (four) times daily as needed for cough. 118 mL 0   tirzepatide (ZEPBOUND) 2.5 MG/0.5ML Pen Inject 2.5 mg into the skin once a week. 2 mL 0   Ubrogepant ([name]) 50 MG TABS 1 PILL AT ONSET OF MIGRAINE, MAY REPEAT IN 2 HOURS, NO MORE THAN 2 PILLS IN 24 HOURS.). 10 tablet 3   Vitamin D, Ergocalciferol, (DRISDOL) 1.25 MG (50000 UNIT) CAPS capsule Take 1 capsule (50,000 Units total) by mouth every 7 (seven) days. 4 capsule 0   Phentermine HCl (LOMAIRA) 8 MG TABS Take 8 mg by mouth daily. (Patient not taking: Reported on 07/18/2022) 28 tablet 0   No facility-administered medications prior to visit.    Allergies  Allergen Reactions   Acetaminophen-Codeine Itching   Hydrocodone Nausea Only   Oxycodone Itching   Tyloxapol Other (See Comments)    Other reaction(s): Unknown   Codeine Itching and Nausea Only    Review of Systems  Constitutional:  Positive for fever. Negative for malaise/fatigue.  HENT:  Positive for congestion and sore throat. Negative for sinus pain.        (+) Sinus drainage  Eyes:  Negative for blurred vision.  Respiratory:  Positive for cough and sputum production. Negative for shortness of breath and wheezing.   Cardiovascular:  Positive for chest pain (Aching pain). Negative for palpitations and leg swelling.       (+) Chest heaviness  Gastrointestinal:  Negative for blood in stool, constipation, diarrhea, nausea and vomiting.  Genitourinary:  Negative for dysuria, frequency and hematuria.  Musculoskeletal:  Positive for back pain. Negative for joint pain.  Skin:  Negative for rash.  Neurological:  Negative for loss of consciousness and headaches.       Objective:    Physical Exam Vitals and nursing note reviewed.  Constitutional:      Appearance: Normal appearance.  HENT:     Head:  Normocephalic and atraumatic.     Comments: Tenderness of frontal and maxillary sinuses.     Right Ear: Tympanic membrane, ear canal and external ear normal.     Left Ear: Tympanic membrane, ear canal and external ear normal.     Nose:     Right Sinus: Maxillary sinus tenderness and frontal sinus tenderness present.     Left Sinus: Maxillary sinus tenderness and frontal sinus tenderness present.     Mouth/Throat:     Mouth: Mucous membranes are moist.     Comments: Post-nasal drip in back of throat. Eyes:     Extraocular Movements: Extraocular movements intact.     Pupils: Pupils are equal, round, and reactive to light.  Cardiovascular:     Rate and Rhythm: Normal rate and regular rhythm.     Heart sounds: Normal heart sounds. No murmur heard.    No gallop.  Pulmonary:     Effort: Pulmonary effort is normal. No respiratory distress.     Breath sounds: Normal breath sounds. No wheezing or rales.  Musculoskeletal:     Cervical back: Normal range of motion.  Lymphadenopathy:     Cervical: Cervical adenopathy present.     Right cervical: Posterior cervical adenopathy present.     Left cervical: Posterior cervical adenopathy present.  Skin:    General: Skin is warm and dry.  Neurological:     General: No focal deficit present.  Mental Status: She is alert and oriented to person, place, and time.  Psychiatric:        Mood and Affect: Mood normal.        Behavior: Behavior normal.     BP 110/70 (BP Location: Right Arm, Patient Position: Sitting, Cuff Size: Normal)   Pulse (!) 104   Temp 99.2 F (37.3 C) (Oral)   Resp 18   Ht 5' 3"$  (1.6 m)   Wt 209 lb 9.6 oz (95.1 kg)   SpO2 97%   BMI 37.13 kg/m  Wt Readings from Last 3 Encounters:  07/18/22 209 lb 9.6 oz (95.1 kg)  07/11/22 205 lb (93 kg)  06/15/22 204 lb (92.5 kg)    Diabetic Foot Exam - Simple   No data filed    Lab Results  Component Value Date   WBC 7.1 02/21/2021   HGB 12.8 07/27/2021   HCT 38.2  07/27/2021   PLT 355.0 02/21/2021   GLUCOSE 62 (L) 02/16/2022   CHOL 220 (H) 02/16/2022   TRIG 76 02/16/2022   HDL 82 02/16/2022   LDLCALC 125 (H) 02/16/2022   ALT 28 02/16/2022   AST 20 02/16/2022   NA 138 02/16/2022   K 4.2 02/16/2022   CL 99 02/16/2022   CREATININE 0.74 02/16/2022   BUN 14 02/16/2022   CO2 22 02/16/2022   TSH 0.697 02/16/2022   INR 1.0 12/15/2019   HGBA1C 5.8 (H) 02/16/2022    Lab Results  Component Value Date   TSH 0.697 02/16/2022   Lab Results  Component Value Date   WBC 7.1 02/21/2021   HGB 12.8 07/27/2021   HCT 38.2 07/27/2021   MCV 91.0 02/21/2021   PLT 355.0 02/21/2021   Lab Results  Component Value Date   NA 138 02/16/2022   K 4.2 02/16/2022   CO2 22 02/16/2022   GLUCOSE 62 (L) 02/16/2022   BUN 14 02/16/2022   CREATININE 0.74 02/16/2022   BILITOT 0.3 02/16/2022   ALKPHOS 107 02/16/2022   AST 20 02/16/2022   ALT 28 02/16/2022   PROT 7.4 02/16/2022   ALBUMIN 4.4 02/16/2022   CALCIUM 9.3 02/16/2022   ANIONGAP 7 11/10/2020   EGFR 97 02/16/2022   GFR 78.83 02/21/2021   Lab Results  Component Value Date   CHOL 220 (H) 02/16/2022   Lab Results  Component Value Date   HDL 82 02/16/2022   Lab Results  Component Value Date   LDLCALC 125 (H) 02/16/2022   Lab Results  Component Value Date   TRIG 76 02/16/2022   Lab Results  Component Value Date   CHOLHDL 2.7 07/07/2021   Lab Results  Component Value Date   HGBA1C 5.8 (H) 02/16/2022       Assessment & Plan:   Problem List Items Addressed This Visit       Unprioritized   COVID-19    Paxlovid sent in   Pt has cough med and abx to start       Relevant Medications   nirmatrelvir/ritonavir (PAXLOVID) 20 x 150 MG & 10 x 100MG TABS   Acute cough - Primary   Relevant Orders   POC COVID-19 (Completed)   POC Influenza A&B (Binax test) (Completed)   POCT rapid strep A (Completed)     Meds ordered this encounter  Medications   nirmatrelvir/ritonavir (PAXLOVID) 20  x 150 MG & 10 x 100MG TABS    Sig: Take 3 tablets by mouth 2 (two) times daily for 5 days. (Take  nirmatrelvir 150 mg two tablets twice daily for 5 days and ritonavir 100 mg one tablet twice daily for 5 days) Patient GFR is 90    Dispense:  30 tablet    Refill:  0    I, [name], DO, personally preformed the services described in this documentation.  All medical record entries made by the scribe were at my direction and in my presence.  I have reviewed the chart and discharge instructions (if applicable) and agree that the record reflects my personal performance and is accurate and complete. 07/18/2022.  I,Mathew Stumpf,acting as a Education administrator for [employer], DO.,have documented all relevant documentation on the behalf of [name], DO,as directed by  [name], DO while in the presence of [name], DO.   [name], DO

## 2022-07-18 NOTE — Assessment & Plan Note (Signed)
Paxlovid sent in   Pt has cough med and abx to start

## 2022-07-25 ENCOUNTER — Encounter: Payer: Self-pay | Admitting: Neurology

## 2022-07-25 ENCOUNTER — Ambulatory Visit: Payer: BC Managed Care – PPO | Admitting: Neurology

## 2022-07-25 DIAGNOSIS — Z79899 Other long term (current) drug therapy: Secondary | ICD-10-CM | POA: Diagnosis not present

## 2022-07-25 DIAGNOSIS — L700 Acne vulgaris: Secondary | ICD-10-CM | POA: Diagnosis not present

## 2022-07-25 NOTE — Progress Notes (Signed)
Chief Complaint:   OBESITY Morgan Long is here to discuss her progress with her obesity treatment plan along with follow-up of her obesity related diagnoses. Morgan Long is on practicing portion control and making smarter food choices, such as increasing vegetables and decreasing simple carbohydrates and states she is following her eating plan approximately 80% of the time. Morgan Long states she is walking 60 minutes 6 times per week.  Today's visit was #: 22 Starting weight: 206 lbs Starting date: 02/02/2021 Today's weight: 205 lbs Today's date: 07/11/2022 Total lbs lost to date: 1 lbs Total lbs lost since last in-office visit: 0  Interim History: Morgan Long has been mostly working.  Her weight has gone up since getting off Ozempic.  She mentions she was wondering about taking phentermine tablets instead of capsules to be able to break them in half to help with the headaches she is getting from the medication.  She has previously taken contrave and saxenda but couldn't tolerate due to side effects.  She is interested in Zepbound.  Really wants to get under the 200lbs.   She is focusing on protein amount.  Wants to come up with other protein methods besides steak.  Sometimes she forgets to eat and then eats a big meal at night.  Struggles to get all meals in daily.   Subjective:   1. Vitamin D deficiency Morgan Long is currently taking prescription Vit D 50,000 IU once a week.   Denies any nausea, vomiting or muscle weakness.   She notes fatigue.  She voices she does not need a refill.  Assessment/Plan:   1. Vitamin D deficiency Continue Vit D without refills today.  2. Obesity with current BMI of 36.4 Will start Zepbound 2.5 mg SubQ once a week for 1 month with 0 refills.  Will switch to makeshift Qsymia if Zepbound not available.  -Start tirzepatide (ZEPBOUND) 2.5 MG/0.5ML Pen; Inject 2.5 mg into the skin once a week.  Dispense: 2 mL; Refill: 0  Morgan Long is currently in the action stage of change. As such, her goal  is to continue with weight loss efforts. She has agreed to the Category 2 Plan and keeping a food journal and adhering to recommended goals of 1300 calories and 85+ grams of protein daily.   Exercise goals: All adults should avoid inactivity. Some physical activity is better than none, and adults who participate in any amount of physical activity gain some health benefits.  Behavioral modification strategies: increasing lean protein intake, meal planning and cooking strategies, keeping healthy foods in the home, and planning for success.  Morgan Long has agreed to follow-up with our clinic in 3 weeks. She was informed of the importance of frequent follow-up visits to maximize her success with intensive lifestyle modifications for her multiple health conditions.   Objective:   Blood pressure 119/74, pulse 91, temperature 98.6 F (37 C), height '5\' 3"'$  (1.6 m), weight 205 lb (93 kg), SpO2 98 %. Body mass index is 36.31 kg/m.  General: Cooperative, alert, well developed, in no acute distress. HEENT: Conjunctivae and lids unremarkable. Cardiovascular: Regular rhythm.  Lungs: Normal work of breathing. Neurologic: No focal deficits.   Lab Results  Component Value Date   CREATININE 0.74 02/16/2022   BUN 14 02/16/2022   NA 138 02/16/2022   K 4.2 02/16/2022   CL 99 02/16/2022   CO2 22 02/16/2022   Lab Results  Component Value Date   ALT 28 02/16/2022   AST 20 02/16/2022   ALKPHOS 107 02/16/2022  BILITOT 0.3 02/16/2022   Lab Results  Component Value Date   HGBA1C 5.8 (H) 02/16/2022   HGBA1C 5.9 (H) 07/07/2021   HGBA1C 5.9 (H) 02/02/2021   HGBA1C 5.9 (H) 05/06/2018   HGBA1C 5.8 (H) 01/23/2018   Lab Results  Component Value Date   INSULIN 22.3 02/16/2022   INSULIN 14.2 07/07/2021   INSULIN 12.8 02/02/2021   INSULIN 11.2 05/06/2018   INSULIN 8.4 01/23/2018   Lab Results  Component Value Date   TSH 0.697 02/16/2022   Lab Results  Component Value Date   CHOL 220 (H) 02/16/2022    HDL 82 02/16/2022   LDLCALC 125 (H) 02/16/2022   TRIG 76 02/16/2022   CHOLHDL 2.7 07/07/2021   Lab Results  Component Value Date   VD25OH 54.5 02/16/2022   VD25OH 60.7 07/07/2021   VD25OH 44.8 02/02/2021   Lab Results  Component Value Date   WBC 7.1 02/21/2021   HGB 12.8 07/27/2021   HCT 38.2 07/27/2021   MCV 91.0 02/21/2021   PLT 355.0 02/21/2021   Lab Results  Component Value Date   FERRITIN 169 05/21/2019   Attestation Statements:   Reviewed by clinician on day of visit: allergies, medications, problem list, medical history, surgical history, family history, social history, and previous encounter notes.  I, Elnora Morrison, RMA am acting as transcriptionist for Coralie Common, MD.  I have reviewed the above documentation for accuracy and completeness, and I agree with the above. - Coralie Common, MD

## 2022-07-27 ENCOUNTER — Other Ambulatory Visit: Payer: Self-pay | Admitting: Family Medicine

## 2022-07-27 ENCOUNTER — Encounter: Payer: Self-pay | Admitting: Family Medicine

## 2022-07-27 ENCOUNTER — Encounter: Payer: Self-pay | Admitting: Plastic Surgery

## 2022-07-27 DIAGNOSIS — J069 Acute upper respiratory infection, unspecified: Secondary | ICD-10-CM

## 2022-07-27 MED ORDER — PROMETHAZINE-DM 6.25-15 MG/5ML PO SYRP: 5.0000 mL | ORAL_SOLUTION | Freq: Four times a day (QID) | ORAL | 0 refills | Status: DC | PRN

## 2022-07-27 NOTE — Telephone Encounter (Signed)
Pt requesting refill on cough meds. Please advise

## 2022-07-31 ENCOUNTER — Other Ambulatory Visit: Payer: Self-pay | Admitting: Family Medicine

## 2022-07-31 MED ORDER — FLUCONAZOLE 150 MG PO TABS: 150.0000 mg | ORAL_TABLET | Freq: Every day | ORAL | 0 refills | Status: DC

## 2022-08-01 ENCOUNTER — Ambulatory Visit (INDEPENDENT_AMBULATORY_CARE_PROVIDER_SITE_OTHER): Payer: BC Managed Care – PPO | Admitting: Neurology

## 2022-08-01 VITALS — BP 122/77 | HR 80 | Ht 63.0 in | Wt 209.0 lb

## 2022-08-01 DIAGNOSIS — G43709 Chronic migraine without aura, not intractable, without status migrainosus: Secondary | ICD-10-CM

## 2022-08-01 MED ORDER — ONABOTULINUMTOXINA 200 UNITS IJ SOLR
200.0000 [IU] | Freq: Once | INTRAMUSCULAR | Status: AC
  Administered 2022-08-01: 200 [IU] via INTRAMUSCULAR

## 2022-08-01 NOTE — Progress Notes (Signed)
   BOTOX PROCEDURE NOTE FOR MIGRAINE HEADACHE   HISTORY: Morgan Long here for botox injection. Last was in 04/05/22.  She is slightly late for Botox given a billing issue.  Botox continues to be 80 to 90% effective.  She had cosmetic Botox 52 units 06/19/22 around her eyes and eyebrows.  She utilizes [name] towards the end of the Botox cycle.  Description of procedure:  The patient was placed in a sitting position. The standard protocol was used for Botox as follows, with 5 units of Botox injected at each site:   -Procerus muscle, deferred given recent cosmetic injection  -Corrugator muscle, deferred given recent cosmetic injection  -Frontalis muscle, bilateral injection, with 2 sites each side, medial injection was performed in the upper one third of the frontalis muscle, in the region vertical from the medial inferior edge of the superior orbital rim. The lateral injection was again in the upper one third of the forehead vertically above the lateral limbus of the cornea, 1.5 cm lateral to the medial injection site.  -Temporalis muscle injection, 4 sites, bilaterally. The first injection was 3 cm above the tragus of the ear, second injection site was 1.5 cm to 3 cm up from the first injection site in line with the tragus of the ear. The third injection site was 1.5-3 cm forward between the first 2 injection sites. The fourth injection site was 1.5 cm posterior to the second injection site.  -Occipitalis muscle injection, 3 sites, bilaterally. The first injection was done one half way between the occipital protuberance and the tip of the mastoid process behind the ear. The second injection site was done lateral and superior to the first, 1 fingerbreadth from the first injection. The third injection site was 1 fingerbreadth superiorly and medially from the first injection site.  -Cervical paraspinal muscle injection, 2 sites, bilateral, the first injection site was 1 cm from the midline of the  cervical spine, 3 cm inferior to the lower border of the occipital protuberance. The second injection site was 1.5 cm superiorly and laterally to the first injection site.  -Trapezius muscle injection was performed at 3 sites, bilaterally. The first injection site was in the upper trapezius muscle halfway between the inflection point of the neck, and the acromion. The second injection site was one half way between the acromion and the first injection site. The third injection was done between the first injection site and the inflection point of the neck.   A 200 unit bottle of Botox was used, 140 units were injected, the rest of the Botox was wasted. The patient tolerated the procedure well, there were no complications of the above procedure.  Botox NDC [phone] Lot number [id number] Expiration date 07/2024 BB  We deferred the procerus and corrugator injections due to recent cosmetic Botox around her eyes and eyebrows.

## 2022-08-01 NOTE — Progress Notes (Signed)
Botox 200 units x 1 vial [id number] B/B

## 2022-08-02 ENCOUNTER — Other Ambulatory Visit: Payer: Self-pay

## 2022-08-02 ENCOUNTER — Encounter (INDEPENDENT_AMBULATORY_CARE_PROVIDER_SITE_OTHER): Payer: Self-pay | Admitting: Family Medicine

## 2022-08-02 ENCOUNTER — Telehealth: Payer: Self-pay | Admitting: *Deleted

## 2022-08-02 ENCOUNTER — Ambulatory Visit (INDEPENDENT_AMBULATORY_CARE_PROVIDER_SITE_OTHER): Payer: BC Managed Care – PPO | Admitting: Family Medicine

## 2022-08-02 VITALS — BP 118/74 | HR 93 | Temp 98.5°F | Ht 63.0 in | Wt 209.0 lb

## 2022-08-02 DIAGNOSIS — R7303 Prediabetes: Secondary | ICD-10-CM | POA: Diagnosis not present

## 2022-08-02 DIAGNOSIS — E669 Obesity, unspecified: Secondary | ICD-10-CM | POA: Diagnosis not present

## 2022-08-02 DIAGNOSIS — Z6837 Body mass index (BMI) 37.0-37.9, adult: Secondary | ICD-10-CM

## 2022-08-02 DIAGNOSIS — Z6839 Body mass index (BMI) 39.0-39.9, adult: Secondary | ICD-10-CM

## 2022-08-02 MED ORDER — FLUCONAZOLE 150 MG PO TABS: 150.0000 mg | ORAL_TABLET | Freq: Every day | ORAL | 0 refills | Status: DC

## 2022-08-02 NOTE — Telephone Encounter (Signed)
Tried to reach patient to schedule surgery. Sounded like a provider and another person were talking , perhaps accidental answer?

## 2022-08-02 NOTE — Progress Notes (Signed)
Chief Complaint:   OBESITY Morgan Long is here to discuss her progress with her obesity treatment plan along with follow-up of her obesity related diagnoses. Morgan Long is on the Category 2 Plan and keeping a food journal and adhering to recommended goals of 1300 calories and 85+ grams protein and states she is following her eating plan approximately 80% of the time. Morgan Long states she is walking 60 minutes 5 times per week.  Today's visit was #: 23 Starting weight: 206 lbs Starting date: 02/02/2021 Today's weight: 208 lbs Today's date: 08/02/2022 Total lbs lost to date: 0 Total lbs lost since last in-office visit: +3  Interim History: Patient got Zepbound and took 2nd shot in 3 days.  So far no issues noted.  She has been eating more at night.  She is getting most of the food in.  She not necessarily weighing and measuring food.  She isn't hungry and is occasionally going long periods without eating.  Last RMR was 02/02/21 and was 1584.  Subjective:   1. Prediabetes Morgan Long is on Zepbound now. She is not noticing much change in food intake.  Assessment/Plan:   1. Prediabetes Follow up on labs at next appt- pt will be fasting.  2. Obesity with current BMI of 37.0 Morgan Long is currently in the action stage of change. As such, her goal is to continue with weight loss efforts. She has agreed to the Category 2 Plan.   Exercise goals:  As is  Behavioral modification strategies: increasing lean protein intake, no skipping meals, meal planning and cooking strategies, and keeping healthy foods in the home.  Morgan Long has agreed to follow-up with our clinic in 3 weeks. She was informed of the importance of frequent follow-up visits to maximize her success with intensive lifestyle modifications for her multiple health conditions.   Objective:   Blood pressure 118/74, pulse 93, temperature 98.5 F (36.9 C), height '5\' 3"'$  (1.6 m), weight 209 lb (94.8 kg), SpO2 99 %. Body mass index is 37.02 kg/m.  General:  Cooperative, alert, well developed, in no acute distress. HEENT: Conjunctivae and lids unremarkable. Cardiovascular: Regular rhythm.  Lungs: Normal work of breathing. Neurologic: No focal deficits.   Lab Results  Component Value Date   CREATININE 0.74 02/16/2022   BUN 14 02/16/2022   NA 138 02/16/2022   K 4.2 02/16/2022   CL 99 02/16/2022   CO2 22 02/16/2022   Lab Results  Component Value Date   ALT 28 02/16/2022   AST 20 02/16/2022   ALKPHOS 107 02/16/2022   BILITOT 0.3 02/16/2022   Lab Results  Component Value Date   HGBA1C 5.8 (H) 02/16/2022   HGBA1C 5.9 (H) 07/07/2021   HGBA1C 5.9 (H) 02/02/2021   HGBA1C 5.9 (H) 05/06/2018   HGBA1C 5.8 (H) 01/23/2018   Lab Results  Component Value Date   INSULIN 22.3 02/16/2022   INSULIN 14.2 07/07/2021   INSULIN 12.8 02/02/2021   INSULIN 11.2 05/06/2018   INSULIN 8.4 01/23/2018   Lab Results  Component Value Date   TSH 0.697 02/16/2022   Lab Results  Component Value Date   CHOL 220 (H) 02/16/2022   HDL 82 02/16/2022   LDLCALC 125 (H) 02/16/2022   TRIG 76 02/16/2022   CHOLHDL 2.7 07/07/2021   Lab Results  Component Value Date   VD25OH 54.5 02/16/2022   VD25OH 60.7 07/07/2021   VD25OH 44.8 02/02/2021   Lab Results  Component Value Date   WBC 7.1 02/21/2021   HGB 12.8  07/27/2021   HCT 38.2 07/27/2021   MCV 91.0 02/21/2021   PLT 355.0 02/21/2021   Lab Results  Component Value Date   FERRITIN 169 05/21/2019    Attestation Statements:   Reviewed by clinician on day of visit: allergies, medications, problem list, medical history, surgical history, family history, social history, and previous encounter notes.  I, Kathlene November, BS, CMA, am acting as transcriptionist for Coralie Common, MD.   I have reviewed the above documentation for accuracy and completeness, and I agree with the above. - Coralie Common, MD

## 2022-08-09 ENCOUNTER — Encounter: Payer: Self-pay | Admitting: Plastic Surgery

## 2022-08-09 ENCOUNTER — Telehealth: Payer: Self-pay | Admitting: *Deleted

## 2022-08-09 NOTE — Telephone Encounter (Signed)
LVM to schedule surgery 

## 2022-08-11 ENCOUNTER — Encounter (INDEPENDENT_AMBULATORY_CARE_PROVIDER_SITE_OTHER): Payer: Self-pay | Admitting: Family Medicine

## 2022-08-14 ENCOUNTER — Telehealth: Payer: Self-pay | Admitting: Plastic Surgery

## 2022-08-14 NOTE — Telephone Encounter (Signed)
LVM and My Chart message about new scheduled appt with Dr. Marla Roe at provider's request.

## 2022-08-14 NOTE — Telephone Encounter (Signed)
Please advise 

## 2022-08-15 ENCOUNTER — Other Ambulatory Visit: Payer: Self-pay | Admitting: Obstetrics and Gynecology

## 2022-08-15 DIAGNOSIS — Z1231 Encounter for screening mammogram for malignant neoplasm of breast: Secondary | ICD-10-CM

## 2022-08-22 ENCOUNTER — Encounter: Payer: Self-pay | Admitting: Plastic Surgery

## 2022-08-22 ENCOUNTER — Ambulatory Visit: Payer: BC Managed Care – PPO | Admitting: Plastic Surgery

## 2022-08-22 VITALS — BP 113/72 | HR 89 | Ht 63.0 in | Wt 205.0 lb

## 2022-08-22 DIAGNOSIS — S300XXD Contusion of lower back and pelvis, subsequent encounter: Secondary | ICD-10-CM

## 2022-08-22 DIAGNOSIS — T148XXA Other injury of unspecified body region, initial encounter: Secondary | ICD-10-CM
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
## 2022-08-23 ENCOUNTER — Encounter (INDEPENDENT_AMBULATORY_CARE_PROVIDER_SITE_OTHER): Payer: Self-pay | Admitting: Family Medicine

## 2022-08-23 ENCOUNTER — Ambulatory Visit (INDEPENDENT_AMBULATORY_CARE_PROVIDER_SITE_OTHER): Payer: BC Managed Care – PPO | Admitting: Family Medicine

## 2022-08-23 VITALS — BP 122/74 | HR 86 | Temp 98.8°F | Ht 63.0 in | Wt 213.0 lb

## 2022-08-23 DIAGNOSIS — R7303 Prediabetes: Secondary | ICD-10-CM

## 2022-08-23 DIAGNOSIS — Z6837 Body mass index (BMI) 37.0-37.9, adult: Secondary | ICD-10-CM

## 2022-08-23 DIAGNOSIS — R110 Nausea: Secondary | ICD-10-CM

## 2022-08-23 DIAGNOSIS — R0602 Shortness of breath: Secondary | ICD-10-CM

## 2022-08-23 DIAGNOSIS — E669 Obesity, unspecified: Secondary | ICD-10-CM | POA: Diagnosis not present

## 2022-08-23 MED ORDER — PROMETHAZINE HCL 12.5 MG PO TABS: 12.5000 mg | ORAL_TABLET | Freq: Four times a day (QID) | ORAL | 0 refills | Status: DC | PRN

## 2022-08-23 MED ORDER — TIRZEPATIDE-WEIGHT MANAGEMENT 5 MG/0.5ML ~~LOC~~ SOAJ: 5.0000 mg | SUBCUTANEOUS | 0 refills | Status: DC

## 2022-08-23 NOTE — Progress Notes (Signed)
Chief Complaint:   OBESITY Morgan Long is here to discuss her progress with her obesity treatment plan along with follow-up of her obesity related diagnoses. Morgan Long is on the Category 2 Plan and states she is following her eating plan approximately 85% of the time. Morgan Long states she is exercise for 60 minutes 2 times per week.  Today's visit was #: 24 Starting weight: 13 LBS Starting date: 02/02/2021 Today's weight: 213 LBS Today's date: 08/23/2022 Total lbs lost to date: 0 Total lbs lost since last in-office visit: +4 LBS  Interim History: Patient is wondering about increasing Zepbound.  She is following meal plan more consistently.  She is having cravings during her menstrual cycle.  She often craves Now & Later's, grapes, [place] slices.  Breakfast is often a protein bar or Microwave meal for breakfast or [employer] egg bites.  Lunch is cabbage and  [place] or a frozen meal.  Dinner has been steak or salad or jerk chicken.  Next few weeks she doesn't have much planned in terms of events or travel.  Activity level has slightly decreased since last appointment  Subjective:   1. Nausea Patient takes promethazine as needed for nausea.  Patient has a long-term standing prescription.  2. SOBOE (shortness of breath on exertion) Patient initially RMR 1584.  Symptoms similar to initial appointment years ago.  Unfortunately patient has not experienced much weight loss.  3. Prediabetes Last A1c was 5.8.  Patient is on GLP-G1P.  Patient has been a prediabetic for 4+ years.  Assessment/Plan:   1. Nausea Refill- promethazine (PHENERGAN) 12.5 MG tablet; Take 1 tablet (12.5 mg total) by mouth every 6 (six) hours as needed for nausea or vomiting.  Dispense: 30 tablet; Refill: 0  2. SOBOE (shortness of breath on exertion) IC today at 1555, continue category 2+100 cal.  3. Prediabetes Fasting labs at next appointment.  4. Obesity with current BMI of 37.9 Increase- tirzepatide (ZEPBOUND) 5 MG/0.5ML  Pen; Inject 5 mg into the skin once a week.  Dispense: 2 mL; Refill: 0  Morgan Long is currently in the action stage of change. As such, her goal is to continue with weight loss efforts. She has agreed to the Category 2 Plan +100 cal.   Exercise goals: All adults should avoid inactivity. Some physical activity is better than none, and adults who participate in any amount of physical activity gain some health benefits.  Behavioral modification strategies: increasing lean protein intake, meal planning and cooking strategies, keeping healthy foods in the home, and planning for success.  Morgan Long has agreed to follow-up with our clinic in 4-5 weeks. She was informed of the importance of frequent follow-up visits to maximize her success with intensive lifestyle modifications for her multiple health conditions.   Objective:   Blood pressure 122/74, pulse 86, temperature 98.8 F (37.1 C), height 5\' 3"  (1.6 m), weight 213 lb (96.6 kg), SpO2 92 %. Body mass index is 37.73 kg/m.  General: Cooperative, alert, well developed, in no acute distress. HEENT: Conjunctivae and lids unremarkable. Cardiovascular: Regular rhythm.  Lungs: Normal work of breathing. Neurologic: No focal deficits.   Lab Results  Component Value Date   CREATININE 0.74 02/16/2022   BUN 14 02/16/2022   NA 138 02/16/2022   K 4.2 02/16/2022   CL 99 02/16/2022   CO2 22 02/16/2022   Lab Results  Component Value Date   ALT 28 02/16/2022   AST 20 02/16/2022   ALKPHOS 107 02/16/2022   BILITOT 0.3 02/16/2022  Lab Results  Component Value Date   HGBA1C 5.8 (H) 02/16/2022   HGBA1C 5.9 (H) 07/07/2021   HGBA1C 5.9 (H) 02/02/2021   HGBA1C 5.9 (H) 05/06/2018   HGBA1C 5.8 (H) 01/23/2018   Lab Results  Component Value Date   INSULIN 22.3 02/16/2022   INSULIN 14.2 07/07/2021   INSULIN 12.8 02/02/2021   INSULIN 11.2 05/06/2018   INSULIN 8.4 01/23/2018   Lab Results  Component Value Date   TSH 0.697 02/16/2022   Lab Results   Component Value Date   CHOL 220 (H) 02/16/2022   HDL 82 02/16/2022   LDLCALC 125 (H) 02/16/2022   TRIG 76 02/16/2022   CHOLHDL 2.7 07/07/2021   Lab Results  Component Value Date   VD25OH 54.5 02/16/2022   VD25OH 60.7 07/07/2021   VD25OH 44.8 02/02/2021   Lab Results  Component Value Date   WBC 7.1 02/21/2021   HGB 12.8 07/27/2021   HCT 38.2 07/27/2021   MCV 91.0 02/21/2021   PLT 355.0 02/21/2021   Lab Results  Component Value Date   FERRITIN 169 05/21/2019   Attestation Statements:   Reviewed by clinician on day of visit: allergies, medications, problem list, medical history, surgical history, family history, social history, and previous encounter notes.  I, Davy Pique, RMA, am acting as transcriptionist for Coralie Common, MD. I have reviewed the above documentation for accuracy and completeness, and I agree with the above. - Coralie Common, MD

## 2022-08-24 ENCOUNTER — Other Ambulatory Visit (INDEPENDENT_AMBULATORY_CARE_PROVIDER_SITE_OTHER): Payer: Self-pay | Admitting: Family Medicine

## 2022-08-30 ENCOUNTER — Telehealth (INDEPENDENT_AMBULATORY_CARE_PROVIDER_SITE_OTHER): Payer: Self-pay

## 2022-08-30 NOTE — Telephone Encounter (Signed)
PA started for Zepbound 5mg  08/30/22

## 2022-08-31 ENCOUNTER — Encounter (INDEPENDENT_AMBULATORY_CARE_PROVIDER_SITE_OTHER): Payer: Self-pay

## 2022-09-07 ENCOUNTER — Encounter: Payer: Self-pay | Admitting: Family Medicine

## 2022-09-08 MED ORDER — TERCONAZOLE 0.8 % VA CREA: 1.0000 | TOPICAL_CREAM | Freq: Every day | VAGINAL | 0 refills | Status: DC

## 2022-09-25 ENCOUNTER — Encounter (INDEPENDENT_AMBULATORY_CARE_PROVIDER_SITE_OTHER): Payer: Self-pay | Admitting: Family Medicine

## 2022-09-25 ENCOUNTER — Ambulatory Visit (INDEPENDENT_AMBULATORY_CARE_PROVIDER_SITE_OTHER): Payer: BC Managed Care – PPO | Admitting: Family Medicine

## 2022-09-25 ENCOUNTER — Other Ambulatory Visit (HOSPITAL_BASED_OUTPATIENT_CLINIC_OR_DEPARTMENT_OTHER): Payer: Self-pay

## 2022-09-25 ENCOUNTER — Other Ambulatory Visit (INDEPENDENT_AMBULATORY_CARE_PROVIDER_SITE_OTHER): Payer: Self-pay | Admitting: Family Medicine

## 2022-09-25 VITALS — BP 112/71 | HR 84 | Temp 98.5°F | Ht 63.0 in | Wt 206.0 lb

## 2022-09-25 DIAGNOSIS — E669 Obesity, unspecified: Secondary | ICD-10-CM | POA: Diagnosis not present

## 2022-09-25 DIAGNOSIS — E559 Vitamin D deficiency, unspecified: Secondary | ICD-10-CM | POA: Diagnosis not present

## 2022-09-25 DIAGNOSIS — R7303 Prediabetes: Secondary | ICD-10-CM | POA: Diagnosis not present

## 2022-09-25 DIAGNOSIS — Z6836 Body mass index (BMI) 36.0-36.9, adult: Secondary | ICD-10-CM

## 2022-09-25 DIAGNOSIS — E7849 Other hyperlipidemia: Secondary | ICD-10-CM

## 2022-09-25 MED ORDER — ZEPBOUND 7.5 MG/0.5ML ~~LOC~~ SOAJ: 7.5000 mg | SUBCUTANEOUS | 0 refills | Status: DC

## 2022-09-25 MED ORDER — ZEPBOUND 7.5 MG/0.5ML ~~LOC~~ SOAJ
7.5000 mg | SUBCUTANEOUS | 0 refills | Status: DC
  Filled 2022-09-26: qty 2, 28d supply, fill #0

## 2022-09-25 NOTE — Progress Notes (Signed)
Chief Complaint:   OBESITY Morgan Long is here to discuss her progress with her obesity treatment plan along with follow-up of her obesity related diagnoses. Morgan Long is on the Category 2 Plan +100 cal and states she is following her eating plan approximately 85% of the time. Morgan Long states she is exercising for 60 minutes 3 times per week.  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  She feels like the next dose of Zepbound may be what covers her hunger and gets her to a sweet spot of being more in control.  She is paying out of pocket for the Zepbound.  She is able to get all the food in consistently.  She mentions she is definitely getting her protein in.  Subjective:   1. Other hyperlipidemia Patient is not on any medications.  Last LDL 125, HDL 82, triglycerides 76.  2. Prediabetes Patient last A1c was 5.8, insulin 22.3.  Patient is on Zepbound weekly, currently.  3. Vitamin D deficiency Patient is on prescription vitamin D.  Patient denies nausea, vomiting, muscle weakness but is positive for fatigue.  Assessment/Plan:   1. Other hyperlipidemia Check labs today.  - Lipid Panel With LDL/HDL Ratio  2. Prediabetes Check labs today.  - Comprehensive metabolic panel - Hemoglobin A1c - Insulin, random  3. Vitamin D deficiency Check labs today.  - VITAMIN D 25 Hydroxy (Vit-D Deficiency, Fractures)  4. Obesity with current BMI of 36.6 Refill/increase- tirzepatide (ZEPBOUND) 7.5 MG/0.5ML Pen; Inject 7.5 mg into the skin once a week.  Dispense: 2 mL; Refill: 0  Morgan Long is currently in the action stage of change. As such, her goal is to continue with weight loss efforts. She has agreed to the Category  2 Plan +100 cal.   Exercise goals: All adults should avoid inactivity. Some physical activity is better than none, and adults who participate in any amount of physical activity gain some health benefits.  Behavioral modification strategies: increasing lean protein intake, meal planning and cooking strategies, keeping healthy foods in the home, and planning for success.  Morgan Long has agreed to follow-up with our clinic in 3-4 weeks. She was informed of the importance of frequent follow-up visits to maximize her success with intensive lifestyle modifications for her multiple health conditions.   Objective:   Blood pressure 112/71, pulse 84, temperature 98.5 F (36.9 C), height  (1.6 m), weight 206 lb (93.4 kg), SpO2 100 %. Body mass index is 36.49 kg/m.  General: Cooperative, alert, well developed, in no acute distress. HEENT: Conjunctivae and lids unremarkable. Cardiovascular: Regular rhythm.  Lungs: Normal work of breathing. Neurologic: No focal deficits.   Lab Results  Component Value Date   CREATININE 0.81 09/25/2022   BUN 14 09/25/2022   NA 139 09/25/2022   K 4.2 09/25/2022   CL 103 09/25/2022   CO2 19 (L) 09/25/2022   Lab Results  Component Value Date   ALT 16 09/25/2022   AST 22 09/25/2022   ALKPHOS 98 09/25/2022   BILITOT 0.3 09/25/2022   Lab Results  Component Value Date   HGBA1C 5.6 09/25/2022   HGBA1C 5.8 (H) 02/16/2022   HGBA1C 5.9 (H) 07/07/2021   HGBA1C 5.9 (H) 02/02/2021  HGBA1C 5.9 (H) 05/06/2018   Lab Results  Component Value Date   INSULIN 8.8 09/25/2022   INSULIN 22.3 02/16/2022   INSULIN 14.2 07/07/2021   INSULIN 12.8 02/02/2021   INSULIN 11.2 05/06/2018   Lab Results  Component Value Date   TSH 0.697 02/16/2022   Lab Results  Component Value Date   CHOL 212 (H) 09/25/2022   HDL 74 09/25/2022   LDLCALC 124 (H) 09/25/2022   TRIG 77 09/25/2022   CHOLHDL 2.7 07/07/2021   Lab Results  Component Value Date   VD25OH 50.7 09/25/2022    VD25OH 54.5 02/16/2022   VD25OH 60.7 07/07/2021   Lab Results  Component Value Date   WBC 7.1 02/21/2021   HGB 12.8 07/27/2021   HCT 38.2 07/27/2021   MCV 91.0 02/21/2021   PLT 355.0 02/21/2021   Lab Results  Component Value Date   FERRITIN 169 05/21/2019   Attestation Statements:   Reviewed by clinician on day of visit: allergies, medications, problem list, medical history, surgical history, family history, social history, and previous encounter notes.  I, Malcolm Metro, RMA, am acting as transcriptionist for Reuben Likes, MD.  I have reviewed the above documentation for accuracy and completeness, and I agree with the above. - Reuben Likes, MD

## 2022-09-26 ENCOUNTER — Other Ambulatory Visit: Payer: Self-pay

## 2022-09-26 ENCOUNTER — Other Ambulatory Visit (HOSPITAL_BASED_OUTPATIENT_CLINIC_OR_DEPARTMENT_OTHER): Payer: Self-pay

## 2022-09-26 LAB — LIPID PANEL WITH LDL/HDL RATIO
Cholesterol, Total: 212 mg/dL — ABNORMAL HIGH (ref 100–199)
HDL: 74 mg/dL (ref >39)
LDL Chol Calc (NIH): 124 mg/dL — ABNORMAL HIGH (ref 0–99)
LDL/HDL Ratio: 1.7 ratio (ref 0.0–3.2)
Triglycerides: 77 mg/dL (ref 0–149)
VLDL Cholesterol Cal: 14 mg/dL (ref 5–40)

## 2022-09-26 LAB — COMPREHENSIVE METABOLIC PANEL
ALT: 16 IU/L (ref 0–32)
AST: 22 IU/L (ref 0–40)
Albumin/Globulin Ratio: 1.4 (ref 1.2–2.2)
Albumin: 4.3 g/dL (ref 3.8–4.9)
Alkaline Phosphatase: 98 IU/L (ref 44–121)
BUN/Creatinine Ratio: 17 (ref 9–23)
BUN: 14 mg/dL (ref 6–24)
Bilirubin Total: 0.3 mg/dL (ref 0.0–1.2)
CO2: 19 mmol/L — ABNORMAL LOW (ref 20–29)
Calcium: 9.2 mg/dL (ref 8.7–10.2)
Chloride: 103 mmol/L (ref 96–106)
Creatinine, Ser: 0.81 mg/dL (ref 0.57–1.00)
Globulin, Total: 3.1 g/dL (ref 1.5–4.5)
Glucose: 82 mg/dL (ref 70–99)
Potassium: 4.2 mmol/L (ref 3.5–5.2)
Sodium: 139 mmol/L (ref 134–144)
Total Protein: 7.4 g/dL (ref 6.0–8.5)
eGFR: 87 mL/min/{1.73_m2} (ref >59)

## 2022-09-26 LAB — VITAMIN D 25 HYDROXY (VIT D DEFICIENCY, FRACTURES): Vit D, 25-Hydroxy: 50.7 ng/mL (ref 30.0–100.0)

## 2022-09-26 LAB — INSULIN, RANDOM: INSULIN: 8.8 u[IU]/mL (ref 2.6–24.9)

## 2022-09-26 LAB — HEMOGLOBIN A1C
Est. average glucose Bld gHb Est-mCnc: 114 mg/dL
Hgb A1c MFr Bld: 5.6 % (ref 4.8–5.6)

## 2022-09-27 ENCOUNTER — Other Ambulatory Visit (HOSPITAL_BASED_OUTPATIENT_CLINIC_OR_DEPARTMENT_OTHER): Payer: Self-pay

## 2022-09-27 ENCOUNTER — Ambulatory Visit
Admission: RE | Admit: 2022-09-27 | Discharge: 2022-09-27 | Disposition: A | Discharge status: Home / Self Care | Payer: BC Managed Care – PPO | Source: Ambulatory Visit | Attending: Obstetrics and Gynecology | Admitting: Obstetrics and Gynecology

## 2022-09-27 ENCOUNTER — Encounter: Payer: Self-pay | Admitting: Family Medicine

## 2022-09-27 DIAGNOSIS — Z1231 Encounter for screening mammogram for malignant neoplasm of breast: Secondary | ICD-10-CM | POA: Diagnosis not present

## 2022-09-28 ENCOUNTER — Encounter: Payer: Self-pay | Admitting: Family Medicine

## 2022-09-29 ENCOUNTER — Ambulatory Visit (INDEPENDENT_AMBULATORY_CARE_PROVIDER_SITE_OTHER): Payer: BC Managed Care – PPO | Admitting: Medical

## 2022-09-29 ENCOUNTER — Ambulatory Visit: Payer: BC Managed Care – PPO | Admitting: Family Medicine

## 2022-09-29 ENCOUNTER — Other Ambulatory Visit (HOSPITAL_BASED_OUTPATIENT_CLINIC_OR_DEPARTMENT_OTHER): Payer: Self-pay

## 2022-09-29 VITALS — BP 110/60 | HR 85 | Temp 98.5°F | Resp 18 | Ht 63.0 in | Wt 215.0 lb

## 2022-09-29 DIAGNOSIS — R519 Headache, unspecified: Secondary | ICD-10-CM

## 2022-09-29 DIAGNOSIS — H6121 Impacted cerumen, right ear: Secondary | ICD-10-CM | POA: Diagnosis not present

## 2022-09-29 DIAGNOSIS — M26621 Arthralgia of right temporomandibular joint: Secondary | ICD-10-CM

## 2022-09-29 DIAGNOSIS — Z79899 Other long term (current) drug therapy: Secondary | ICD-10-CM | POA: Diagnosis not present

## 2022-09-29 DIAGNOSIS — L700 Acne vulgaris: Secondary | ICD-10-CM | POA: Diagnosis not present

## 2022-09-29 DIAGNOSIS — G43001 Migraine without aura, not intractable, with status migrainosus: Secondary | ICD-10-CM | POA: Diagnosis not present

## 2022-09-29 LAB — SEDIMENTATION RATE: Sed Rate: 44 mm/hr — ABNORMAL HIGH (ref 0–30)

## 2022-09-29 MED ORDER — KETOROLAC TROMETHAMINE 60 MG/2ML IM SOLN
60.0000 mg | Freq: Once | INTRAMUSCULAR | Status: AC
Start: 2022-09-29 — End: 2022-09-29
  Administered 2022-09-29: 60 mg via INTRAMUSCULAR

## 2022-09-29 MED ORDER — PREDNISONE 10 MG (21) PO TBPK: ORAL_TABLET | ORAL | 0 refills | Status: DC

## 2022-09-29 MED ORDER — CYCLOBENZAPRINE HCL 5 MG PO TABS: ORAL_TABLET | ORAL | 0 refills | Status: DC

## 2022-09-29 NOTE — Patient Instructions (Addendum)
1. Arthralgia of right temporomandibular joint/tmj syndrome.  Difficulty opening mouth of provide on the right side.  TMJ region tenderness to palpation.  Will prescribe Flexeril 5 mg tablets to take 1 to 2 tablets prior to sleep.  Rx advisement given.  Starting tomorrow he can use combination of Tylenol with low-dose ibuprofen every 8 hours as needed.  Advised on eating soft foods.  If pain worsens despite this treatment would get a dentist opinion since he did have a recent dental procedure.   2. Migraine without aura and with status migrainosus, not intractable and  Temporal pain Migraine headache typically on the right side but some light sensitivity recently.  Left side temporal region pain with described swelling yesterday.  Will get sed rate to make sure not elevated.  If not elevated.  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  Toradol 60 mg presently to treat migraine.  Continue Bernita Raisin.  If worsening headache or neurologic signs symptoms be seen in the emergency department. - Sedimentation rate  3.  Impacted cerumen of right ear Small portion of TM seen looks normal.  You could use Debrox over-the-counter to soften wax and follow-up in 7 days for ear lavage.  Later in the day sed rate came back at 44.  So decided to send in 6-day taper dose of prednisone.  Sent patient a message regarding this and advised her not to use ibuprofen while using prednisone.  Follow-up in 7 days or sooner if needed.

## 2022-09-29 NOTE — Progress Notes (Signed)
Subjective:    Patient ID: Morgan Long, female    DOB: 07-22-1970, 52 y.o.   MRN: [id number]  HPI  Pt in for evaluation.  Pt has recent rt side tmj tenderness for 2 days. Pt told had tmj by dentist on exam in past but she never had daily pain. Pt had dental procedure about 10 days ago but not pain following day. Pt not takin any med for this.   Pt is going to get mouth guard eventually.  Pt also states some left temporal area since yesterday(she thought maybe might skin in temproal area was more swollen yesterday. Some light sensitivity. Pt get botox injections for migrains headache. Pt also on ubrelvy.    Review of Systems  Constitutional:  Negative for chills and fatigue.  HENT:  Negative for congestion, ear pain and facial swelling.   Respiratory:  Negative for chest tightness and wheezing.   Cardiovascular:  Negative for chest pain and palpitations.  Gastrointestinal:  Negative for abdominal distention and anal bleeding.  Musculoskeletal:  Negative for back pain.  Skin:  Negative for rash.  Neurological:  Negative for dizziness, light-headedness and headaches.  Hematological:  Negative for adenopathy. Does not bruise/bleed easily.  Psychiatric/Behavioral:  Negative for behavioral problems, decreased concentration and sleep disturbance. The patient is not nervous/anxious.     Past Medical History:  Diagnosis Date   Allergic rhinitis    Asthma    B12 deficiency    Chronic migraine    Dr. Clarisse Gouge   Constipation    Dry eye syndrome of both lacrimal glands    Dry skin    Endometriosis    Fatigue    Gastroparesis    GERD (gastroesophageal reflux disease)    Headache(784.0)    occasional, dx w/ Migraines before, Topamax helps   Heartburn    Hepatic steatosis    History of stomach ulcers    Lactose intolerance    Localized edema    Migraine    Muscle weakness (generalized)    Nuclear cataract of both eyes    Mild   Overweight    Pain in right ankle and joints of  right foot    Prediabetes    Shortness of breath    Sinus complaint    Sinusitis    Tired    TMJ pain dysfunction syndrome    occasional   Vitamin D deficiency      Social History   Socioeconomic History   Marital status: Single    Spouse name: Not on file   Number of children: 0   Years of education: BS   Highest education level: Master's degree (e.g., MA, MS, MEng, MEd, MSW, MBA)  Occupational History   Occupation: Sports coach, going to school    Employer: [employer]   Occupation: Wellsite geologist  Tobacco Use   Smoking status: Never    Passive exposure: Never   Smokeless tobacco: Never  Vaping Use   Vaping Use: Never used  Substance and Sexual Activity   Alcohol use: Not Currently    Comment: socially - occasional    Drug use: No   Sexual activity: Yes    Birth control/protection: Inserts    Comment: nuvaring  Other Topics Concern   Not on file  Social History Narrative   Household:sister and her 3 kids    Drinks occasional starbucks drink       Social Determinants of Health   Financial Resource Strain: Patient Declined (09/29/2022)  Overall Financial Resource Strain (CARDIA)    Difficulty of Paying Living Expenses: Patient declined  Food Insecurity: Food Insecurity Present (09/29/2022)   Hunger Vital Sign    Worried About Running Out of Food in the Last Year: Patient declined    Ran Out of Food in the Last Year: Sometimes true  Transportation Needs: No Transportation Needs (09/29/2022)   PRAPARE - Administrator, Civil Service (Medical): No    Lack of Transportation (Non-Medical): No  Physical Activity: Insufficiently Active (09/29/2022)   Exercise Vital Sign    Days of Exercise per Week: 2 days    Minutes of Exercise per Session: 50 min  Stress: No Stress Concern Present (09/29/2022)   [employer] of Occupational Health - Occupational Stress Questionnaire    Feeling of Stress : Only a little  Social Connections: Moderately  Integrated (09/29/2022)   Social Connection and Isolation Panel [NHANES]    Frequency of Communication with Friends and Family: More than three times a week    Frequency of Social Gatherings with Friends and Family: Once a week    Attends Religious Services: 1 to 4 times per year    Active Member of [employer] or Organizations: Yes    Attends Banker Meetings: 1 to 4 times per year    Marital Status: Never married  Intimate Partner Violence: Not on file    Past Surgical History:  Procedure Laterality Date   ANKLE SURGERY Left 11/16/2017   AUGMENTATION MAMMAPLASTY Bilateral 2006   BREAST ENHANCEMENT SURGERY  2006   BUNIONECTOMY     COLONOSCOPY     endrometroisis     fallopian tube removed     Left   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   UPPER GASTROINTESTINAL ENDOSCOPY     [facility] teeth extraction      Family History  Problem Relation Age of Onset   Throat cancer Mother    Cancer - Other Mother 51       throat - died 6 months   Cancer Mother    Alcoholism Mother    Stroke Father    Heart disease Father    Cancer Father    Colon cancer Paternal Grandfather    Breast cancer Maternal Aunt        breast   Breast cancer Maternal Aunt        breast   Pancreatic cancer Maternal Aunt    Hypertension Maternal Aunt        several family members   Breast cancer Maternal Aunt        total of 5 aunts   Breast cancer Maternal Aunt    Colon cancer Maternal Uncle 44       died 48   Throat cancer Maternal Uncle    Diabetes Other        grandmother   Asthma Other        cousin, maternal   Heart attack Neg Hx    Rectal cancer Neg Hx    Stomach cancer Neg Hx     Allergies  Allergen Reactions   Acetaminophen-Codeine Itching   Hydrocodone Nausea Only   Oxycodone Itching   Tyloxapol Other (See Comments)    Other reaction(s): Unknown   Codeine Itching and Nausea Only    Current Outpatient Medications on File Prior to Visit   Medication Sig Dispense Refill   cetirizine (ZYRTEC) 10 MG tablet TAKE 1 TABLET BY MOUTH DAILY  30 tablet 5   clindamycin (CLEOCIN T) 1 % external solution Apply topically 2 (two) times daily. 30 mL 5   cyanocobalamin (,VITAMIN B-12,) 1000 MCG/ML injection INJECT 1 ML (1,000 MCG TOTAL) INTO THE MUSCLE ONCE FOR 1 DOSE. 3 mL 3   ESTROGEL 0.75 MG/1.25 GM (0.06%) topical gel Apply 1 application topically daily.  0   etonogestrel-ethinyl estradiol (NUVARING) 0.12-0.015 MG/24HR vaginal ring Place 1 each vaginally every 28 (twenty-eight) days. Insert vaginally and leave in place for 3 consecutive weeks, then remove for 1 week.     LINZESS 72 MCG capsule Take 72 mcg by mouth every morning.     Multiple Vitamin (MULTIVITAMIN) LIQD Take 5 mLs by mouth daily.     promethazine (PHENERGAN) 12.5 MG tablet Take 1 tablet (12.5 mg total) by mouth every 6 (six) hours as needed for nausea or vomiting. 30 tablet 0   tirzepatide (ZEPBOUND) 7.5 MG/0.5ML Pen Inject 7.5 mg into the skin once a week. 2 mL 0   Ubrogepant ([name]) 50 MG TABS 1 PILL AT ONSET OF MIGRAINE, MAY REPEAT IN 2 HOURS, NO MORE THAN 2 PILLS IN 24 HOURS.). 10 tablet 3   Vitamin D, Ergocalciferol, (DRISDOL) 1.25 MG (50000 UNIT) CAPS capsule Take 1 capsule (50,000 Units total) by mouth every 7 (seven) days. 4 capsule 0   No current facility-administered medications on file prior to visit.    BP 110/60   Pulse 85   Temp 98.5 F (36.9 C)   Resp 18   Ht 5\' 3"  (1.6 m)   Wt 215 lb (97.5 kg)   SpO2 98%   BMI 38.09 kg/m        Objective:   Physical Exam  General Mental Status- Alert. General Appearance- Not in acute distress.   Skin General: Color- Normal Color. Moisture- Normal Moisture.  Neck Carotid Arteries- Normal color. Moisture- Normal Moisture. No carotid bruits. No JVD.  Chest and Lung Exam Auscultation: Breath Sounds:-Normal.  Cardiovascular Auscultation:Rythm- Regular. Murmurs & Other Heart Sounds:Auscultation of the  heart reveals- No Murmurs.    Neurologic Cranial Nerve exam:- CN III-XII intact(No nystagmus), symmetric smile.  Some light sensitivity on exam. Drift Test:- No drift. Finger to Nose:- Normal/Intact Strength:- 5/5 equal and symmetric strength both upper and lower extremities.  HEENT-left temple area mild tenderness to palpation but I do not see any dilated vein.  Left canal clear with normal TM.  Right canal proxy obstructed with cerumen.  Small portion of the TM that I see was normal.  No frontal or maxillary sinus tenderness.  Nonboggy turbinates.  Right TMJ area mild tenderness to palpation directly.  On inspection of the mouth she does have looks like cavity that was recently on the right lower tooth.  Patient does report pain in right TMJ area attempted opening her mouth.  Skin-patient has no rash on her face particularly left temporal region.      Assessment & Plan:   Patient Instructions  1. Arthralgia of right temporomandibular joint/tmj syndrome.  Difficulty opening mouth of provide on the right side.  TMJ region tenderness to palpation.  Will prescribe Flexeril 5 mg tablets to take 1 to 2 tablets prior to sleep.  Rx advisement given.  Starting tomorrow he can use combination of Tylenol with low-dose ibuprofen every 8 hours as needed.  Advised on eating soft foods.  If pain worsens despite this treatment would get a dentist opinion since he did have a recent dental procedure.   2. Migraine without aura  and with status migrainosus, not intractable and  Temporal pain Migraine headache typically on the right side but some light sensitivity recently.  Left side temporal region pain with described swelling yesterday.  Will get sed rate to make sure not elevated.  If not elevated.  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  Toradol 60 mg presently to treat migraine.  Continue Bernita Raisin.  If worsening headache or neurologic signs symptoms be seen in the emergency  department. - Sedimentation rate  3.  Impacted cerumen of right ear Small portion of TM seen looks normal.  You could use Debrox over-the-counter to soften wax and follow-up in 7 days for ear lavage.  Follow-up in 7 days or sooner if needed.   Esperanza Richters, PA-C

## 2022-10-01 ENCOUNTER — Encounter: Payer: Self-pay | Admitting: Medical

## 2022-10-02 ENCOUNTER — Encounter (INDEPENDENT_AMBULATORY_CARE_PROVIDER_SITE_OTHER): Payer: Self-pay | Admitting: Family Medicine

## 2022-10-02 ENCOUNTER — Other Ambulatory Visit (HOSPITAL_BASED_OUTPATIENT_CLINIC_OR_DEPARTMENT_OTHER): Payer: Self-pay

## 2022-10-03 ENCOUNTER — Other Ambulatory Visit (HOSPITAL_BASED_OUTPATIENT_CLINIC_OR_DEPARTMENT_OTHER): Payer: Self-pay

## 2022-10-03 ENCOUNTER — Encounter (INDEPENDENT_AMBULATORY_CARE_PROVIDER_SITE_OTHER): Payer: Self-pay | Admitting: Family Medicine

## 2022-10-03 NOTE — Telephone Encounter (Signed)
Please review

## 2022-10-03 NOTE — Telephone Encounter (Signed)
A1C is 5.6 on 09/25/22

## 2022-10-04 ENCOUNTER — Encounter: Payer: Self-pay | Admitting: Family Medicine

## 2022-10-05 ENCOUNTER — Other Ambulatory Visit: Payer: Self-pay | Admitting: Family Medicine

## 2022-10-05 DIAGNOSIS — E7849 Other hyperlipidemia: Secondary | ICD-10-CM

## 2022-10-05 MED ORDER — WEGOVY 0.5 MG/0.5ML ~~LOC~~ SOAJ
0.5000 mg | SUBCUTANEOUS | 3 refills | Status: DC
Start: 2022-10-05 — End: 2023-01-04
  Filled 2022-10-09: qty 2, 28d supply, fill #0

## 2022-10-06 ENCOUNTER — Encounter: Payer: Self-pay | Admitting: Family Medicine

## 2022-10-06 ENCOUNTER — Telehealth: Payer: Self-pay

## 2022-10-06 ENCOUNTER — Other Ambulatory Visit (HOSPITAL_BASED_OUTPATIENT_CLINIC_OR_DEPARTMENT_OTHER): Payer: Self-pay

## 2022-10-06 NOTE — Telephone Encounter (Signed)
Returned pt's call. Voicemail box is full/unable to leave message.

## 2022-10-09 ENCOUNTER — Ambulatory Visit (INDEPENDENT_AMBULATORY_CARE_PROVIDER_SITE_OTHER): Payer: BC Managed Care – PPO | Admitting: Physician Assistant

## 2022-10-09 ENCOUNTER — Encounter (INDEPENDENT_AMBULATORY_CARE_PROVIDER_SITE_OTHER): Payer: Self-pay | Admitting: Physician Assistant

## 2022-10-09 ENCOUNTER — Other Ambulatory Visit (HOSPITAL_BASED_OUTPATIENT_CLINIC_OR_DEPARTMENT_OTHER): Payer: Self-pay

## 2022-10-09 VITALS — BP 119/73 | HR 77 | Temp 98.1°F | Ht 63.0 in | Wt 213.0 lb

## 2022-10-09 DIAGNOSIS — E559 Vitamin D deficiency, unspecified: Secondary | ICD-10-CM | POA: Diagnosis not present

## 2022-10-09 DIAGNOSIS — E7849 Other hyperlipidemia: Secondary | ICD-10-CM

## 2022-10-09 DIAGNOSIS — R7303 Prediabetes: Secondary | ICD-10-CM | POA: Diagnosis not present

## 2022-10-09 DIAGNOSIS — Z6837 Body mass index (BMI) 37.0-37.9, adult: Secondary | ICD-10-CM

## 2022-10-09 DIAGNOSIS — R632 Polyphagia: Secondary | ICD-10-CM

## 2022-10-09 DIAGNOSIS — E669 Obesity, unspecified: Secondary | ICD-10-CM

## 2022-10-09 MED ORDER — VITAMIN D (ERGOCALCIFEROL) 1.25 MG (50000 UNIT) PO CAPS
50000.0000 [IU] | ORAL_CAPSULE | ORAL | 0 refills | Status: DC
Start: 2022-10-09 — End: 2023-11-01

## 2022-10-09 NOTE — Progress Notes (Signed)
Office: [phone]  /  Fax: [phone]  WEIGHT SUMMARY AND BIOMETRICS  Vitals Temp: 98.1 F (36.7 C) BP: 119/73 Pulse Rate: 77 SpO2: 96 %   Anthropometric Measurements Height: 5\' 3"  (1.6 m) Weight: 213 lb (96.6 kg) BMI (Calculated): 37.74 Weight at Last Visit: 206 lb Weight Gained Since Last Visit: 7 lb Starting Weight: 206 lb   Body Composition  Body Fat %: 46 % Fat Mass (lbs): 98 lbs Muscle Mass (lbs): 109.2 lbs Total Body Water (lbs): 80.6 lbs Visceral Fat Rating : 13   Other Clinical Data Fasting: yes Labs: no Today's Visit #: 26 Starting Date: 02/02/21     HPI  Chief Complaint: OBESITY  Morgan Long is here to discuss her progress with her obesity treatment plan. She is on the the Category 2 Plan and states she is following her eating plan approximately 70 % of the time. She states she is exercising/walking 20-30 minutes 3 times per week.   Interval History:  Since last office visit she up 7 lbs.  Hunger/appetite-  fair to moderate control-= Reports was better when taking Ozempic Cravings- Up off GLP-1 medication Sleep- not always restorative.  Exercise- walking 3 times weekly  Pharmacotherapy: GNFAOZ ordered last visit- Has not started yet. Patient denies a personal or family history of pancreatitis, medullary thyroid carcinoma or multiple endocrine neoplasia type II. Denies mass in neck, dysphagia, dyspepsia, persistent hoarseness, abdominal pain, or N/V/Constipation or diarrhea. Has annual eye exam. Mood is stable.   PHYSICAL EXAM:  Blood pressure 119/73, pulse 77, temperature 98.1 F (36.7 C), height 5\' 3"  (1.6 m), weight 213 lb (96.6 kg), SpO2 96 %. Body mass index is 37.73 kg/m.  General: She is overweight, cooperative, alert, well developed, and in no acute distress. PSYCH: Has normal mood, affect and thought process.   Cardiovascular: HR 70's regular Lungs: Normal breathing effort, no conversational dyspnea. Neuro: no focal deficits.    DIAGNOSTIC DATA REVIEWED:  BMET    Component Value Date/Time   NA 139 09/25/2022 1107   K 4.2 09/25/2022 1107   CL 103 09/25/2022 1107   CO2 19 (L) 09/25/2022 1107   GLUCOSE 82 09/25/2022 1107   GLUCOSE 90 02/21/2021 1020   BUN 14 09/25/2022 1107   CREATININE 0.81 09/25/2022 1107   CALCIUM 9.2 09/25/2022 1107   GFRNONAA >60 11/10/2020 2207   GFRAA >60 12/15/2019 0733   Lab Results  Component Value Date   HGBA1C 54 09/25/2022   HGBA1C 5.7 (H) 05/22/2014   Lab Results  Component Value Date   INSULIN 8.8 09/25/2022   INSULIN 8.0 08/14/2017   Lab Results  Component Value Date   TSH 0.697 02/16/2022   CBC    Component Value Date/Time   WBC 7.1 02/21/2021 1020   RBC 4.13 02/21/2021 1020   HGB 12.8 07/27/2021 1329   HCT 38.2 07/27/2021 1329   PLT 355.0 02/21/2021 1020   MCV 91.0 02/21/2021 1020   MCV 91 02/02/2021 0938   MCH 29.5 02/02/2021 0938   MCH 29.6 11/10/2020 2207   MCHC 32.5 02/21/2021 1020   RDW 14.0 02/21/2021 1020   RDW 13.1 02/02/2021 0938   Iron Studies    Component Value Date/Time   FERRITIN 169 05/21/2019 2101   Lipid Panel     Component Value Date/Time   CHOL 212 (H) 09/25/2022 1107   TRIG 77 09/25/2022 1107   HDL 74 09/25/2022 1107   CHOLHDL 2.7 07/07/2021 1059   CHOLHDL 3 05/14/2014 1223   VLDL  12.2 05/14/2014 1223   LDLCALC 124 (H) 09/25/2022 1107   Hepatic Function Panel     Component Value Date/Time   PROT 7.4 09/25/2022 1107   ALBUMIN 4.3 09/25/2022 1107   AST 22 09/25/2022 1107   ALT 16 09/25/2022 1107   ALKPHOS 98 09/25/2022 1107   BILITOT 0.3 09/25/2022 1107   BILIDIR 0.0 05/14/2014 1223      Component Value Date/Time   TSH 0.697 02/16/2022 1445   Nutritional Lab Results  Component Value Date   VD25OH 50.7 09/25/2022   VD25OH 54.5 02/16/2022   VD25OH 60.7 07/07/2021    ASSOCIATED CONDITIONS ADDRESSED TODAY  ASSESSMENT AND PLAN  Problem List Items Addressed This Visit     Generalized obesity    Prediabetes - Primary   Vitamin D deficiency   Relevant Medications   Vitamin D, Ergocalciferol, (DRISDOL) 1.25 MG (50000 UNIT) CAPS capsule   Polyphagia   Other hyperlipidemia   BMI 37.0-37.9, adult Current BMI 37.7   Prediabetes Labs reviewed today and discussed with patient.  Last A1c was 5.6- improved, Insulin 8.8- nearing goal and improved.   Medication(s): Wegovy 0.50 mg SQ weekly- ordered per Dr. Laury Axon. Denies mass in neck, dysphagia, dyspepsia, persistent hoarseness, abdominal pain, or N/V/Constipation or diarrhea. Has annual eye exam. Mood is stable.   She is working on Engineer, technical sales to decrease simple carbohydrates, increase lean proteins and exercise to promote weight loss, improve glycemic control and prevent progression to Type 2 diabetes.   Lab Results  Component Value Date   HGBA1C 54 09/25/2022   HGBA1C 5.8 (H) 02/16/2022   HGBA1C 5.9 (H) 07/07/2021   HGBA1C 5.9 (H) 02/02/2021   HGBA1C 5.9 (H) 05/06/2018   Lab Results  Component Value Date   INSULIN 8.8 09/25/2022   INSULIN 22.3 02/16/2022   INSULIN 14.2 07/07/2021   INSULIN 12.8 02/02/2021   INSULIN 11.2 05/06/2018    Plan: Continue Wegovy 0.50 mg SQ weekly- ordered by Dr. Laury Axon and patient is picking up this week.  Continue working on nutrition plan to decrease simple carbohydrates, increase lean proteins and exercise to promote weight loss, improve glycemic control and prevent progression to Type 2 diabetes.    Vitamin D Deficiency Labs were reviewed today and discussed with the patient.   Vitamin D is at goal of 50.  Most recent vitamin D level was 50.7. She is on  prescription ergocalciferol 50,000 IU weekly. Lab Results  Component Value Date   VD25OH 50.7 09/25/2022   VD25OH 54.5 02/16/2022   VD25OH 60.7 07/07/2021    Plan: Continue and refill  prescription ergocalciferol 50,000 IU weekly Low vitamin D levels can be associated with adiposity and may result in leptin resistance and weight gain.  Also associated with fatigue. Currently on vitamin D supplementation without any adverse effects.    Hyperlipidemia Labs were reviewed today and discussed with the patient.   LDL is not at goal. Medication(s): None.  Working on Engineer, technical sales and exercise to promote weight loss and improve lipid profiles.  Cardiovascular risk factors: dyslipidemia and obesity (BMI >= 30 kg/m2)  Lab Results  Component Value Date   CHOL 212 (H) 09/25/2022   HDL 74 09/25/2022   LDLCALC 124 (H) 09/25/2022   TRIG 77 09/25/2022   CHOLHDL 2.7 07/07/2021   CHOLHDL 3 05/14/2014   CHOLHDL 3 03/28/2011   Lab Results  Component Value Date   ALT 16 09/25/2022   AST 22 09/25/2022   ALKPHOS 98 09/25/2022   BILITOT 0.3  09/25/2022   The 10-year ASCVD risk score (Arnett DK, et al., 2019) is: 3.6%   Values used to calculate the score:     Age: 66 years     Sex: Female     Is Non-Hispanic African American: Yes     Diabetic: Yes     Tobacco smoker: No     Systolic Blood Pressure: 119 mmHg     Is BP treated: No     HDL Cholesterol: 74 mg/dL     Total Cholesterol: 212 mg/dL  Plan: Continue to work on nutrition plan -decreasing simple carbohydrates, increasing lean proteins, decreasing saturated fats and cholesterol , avoiding trans fats and exercise as able to promote weight loss, improve lipids and decrease cardiovascular risks. HDL 74 and ratio is good overall.  She is going to resume a GLP- medication and will need to monitor lipids and see if has improvement in lipid profile.   Polyphagia Currently this is poorly controlled. Medication(s): Wegovy 0.50 mg SQ weekly. She is just resuming GLP-1, Wegovy.   Reported side effects: None  Plan: Continue Wegovy 0.50 mg SQ weekly Monitor response to resumption of GLP-1 medication.  Continue working on nutrition plan to decrease simple carbohydrates, increase lean proteins and exercise to promote weight loss, improve glycemic control and prevent progression to  Type 2 diabetes.   TREATMENT PLAN FOR OBESITY:  Recommended Dietary Goals  Tressie is currently in the action stage of change. As such, her goal is to continue weight management plan. She has agreed to the Category 2 Plan.  Behavioral Intervention  We discussed the following Behavioral Modification Strategies today: increasing lean protein intake, decreasing simple carbohydrates , increasing vegetables, increasing lower glycemic fruits, increasing water intake, work on tracking and journaling calories using tracking application, continue to practice mindfulness when eating, and planning for success.  Additional resources provided today: NA  Recommended Physical Activity Goals  Perri has been advised to work up to 150 minutes of moderate intensity aerobic activity a week and strengthening exercises 2-3 times per week for cardiovascular health, weight loss maintenance and preservation of muscle mass.   She has agreed to Continue current level of physical activity    Pharmacotherapy We discussed various medication options to help Sayana with her weight loss efforts and we both agreed to continue Wegovy 0.5 mg weekly and continue to work on nutritional and behavioral strategies to promote weight loss.    Return in about 3 weeks (around 10/30/2022).Marland Kitchen She was informed of the importance of frequent follow up visits to maximize her success with intensive lifestyle modifications for her multiple health conditions.   ATTESTASTION STATEMENTS:  Reviewed by clinician on day of visit: allergies, medications, problem list, medical history, surgical history, family history, social history, and previous encounter notes.   I have personally spent 42 minutes total time today in preparation, patient care, nutritional counseling and documentation for this visit, including the following: review of clinical lab tests; review of medical tests/procedures/services.      Travis Purk, PA-C

## 2022-10-11 ENCOUNTER — Other Ambulatory Visit (HOSPITAL_BASED_OUTPATIENT_CLINIC_OR_DEPARTMENT_OTHER): Payer: Self-pay

## 2022-10-19 ENCOUNTER — Other Ambulatory Visit (HOSPITAL_BASED_OUTPATIENT_CLINIC_OR_DEPARTMENT_OTHER): Payer: Self-pay

## 2022-10-23 ENCOUNTER — Ambulatory Visit (INDEPENDENT_AMBULATORY_CARE_PROVIDER_SITE_OTHER): Payer: BC Managed Care – PPO | Admitting: Family Medicine

## 2022-10-24 ENCOUNTER — Other Ambulatory Visit: Payer: Self-pay | Admitting: Neurology

## 2022-10-31 ENCOUNTER — Other Ambulatory Visit (INDEPENDENT_AMBULATORY_CARE_PROVIDER_SITE_OTHER): Payer: Self-pay | Admitting: Physician Assistant

## 2022-10-31 DIAGNOSIS — E559 Vitamin D deficiency, unspecified: Secondary | ICD-10-CM

## 2022-11-04 ENCOUNTER — Encounter (INDEPENDENT_AMBULATORY_CARE_PROVIDER_SITE_OTHER): Payer: Self-pay | Admitting: Family Medicine

## 2022-11-06 ENCOUNTER — Ambulatory Visit (INDEPENDENT_AMBULATORY_CARE_PROVIDER_SITE_OTHER): Payer: BC Managed Care – PPO | Admitting: Family Medicine

## 2022-11-06 DIAGNOSIS — Z6841 Body Mass Index (BMI) 40.0 and over, adult: Secondary | ICD-10-CM | POA: Diagnosis not present

## 2022-11-06 DIAGNOSIS — Z01419 Encounter for gynecological examination (general) (routine) without abnormal findings: Secondary | ICD-10-CM | POA: Diagnosis not present

## 2022-11-06 DIAGNOSIS — R300 Dysuria: Secondary | ICD-10-CM | POA: Diagnosis not present

## 2022-11-06 NOTE — Telephone Encounter (Signed)
Please reschedule appt, Thanks

## 2022-11-16 ENCOUNTER — Other Ambulatory Visit: Payer: Self-pay

## 2022-11-16 ENCOUNTER — Emergency Department (HOSPITAL_BASED_OUTPATIENT_CLINIC_OR_DEPARTMENT_OTHER)
Admission: EM | Admit: 2022-11-16 | Discharge: 2022-11-16 | Disposition: A | Discharge status: Home / Self Care | Payer: BC Managed Care – PPO | Attending: Emergency Medicine | Admitting: Emergency Medicine

## 2022-11-16 ENCOUNTER — Telehealth: Payer: Self-pay | Admitting: Neurology

## 2022-11-16 ENCOUNTER — Encounter: Payer: Self-pay | Admitting: Family Medicine

## 2022-11-16 ENCOUNTER — Telehealth: Payer: Self-pay | Admitting: Family Medicine

## 2022-11-16 ENCOUNTER — Encounter (HOSPITAL_BASED_OUTPATIENT_CLINIC_OR_DEPARTMENT_OTHER): Payer: Self-pay | Admitting: Urology

## 2022-11-16 ENCOUNTER — Emergency Department (HOSPITAL_BASED_OUTPATIENT_CLINIC_OR_DEPARTMENT_OTHER): Payer: BC Managed Care – PPO

## 2022-11-16 DIAGNOSIS — R519 Headache, unspecified: Secondary | ICD-10-CM | POA: Diagnosis not present

## 2022-11-16 DIAGNOSIS — M545 Low back pain, unspecified: Secondary | ICD-10-CM | POA: Insufficient documentation

## 2022-11-16 DIAGNOSIS — R079 Chest pain, unspecified: Secondary | ICD-10-CM | POA: Diagnosis not present

## 2022-11-16 DIAGNOSIS — G43909 Migraine, unspecified, not intractable, without status migrainosus: Secondary | ICD-10-CM | POA: Diagnosis not present

## 2022-11-16 DIAGNOSIS — R110 Nausea: Secondary | ICD-10-CM | POA: Insufficient documentation

## 2022-11-16 DIAGNOSIS — R0789 Other chest pain: Secondary | ICD-10-CM | POA: Diagnosis not present

## 2022-11-16 DIAGNOSIS — G43911 Migraine, unspecified, intractable, with status migrainosus: Secondary | ICD-10-CM | POA: Diagnosis not present

## 2022-11-16 LAB — CBC WITH DIFFERENTIAL/PLATELET
Abs Immature Granulocytes: 0.03 10*3/uL (ref 0.00–0.07)
Basophils Absolute: 0.1 10*3/uL (ref 0.0–0.1)
Basophils Relative: 1 %
Eosinophils Absolute: 0.1 10*3/uL (ref 0.0–0.5)
Eosinophils Relative: 1 %
HCT: 40.4 % (ref 36.0–46.0)
Hemoglobin: 13.2 g/dL (ref 12.0–15.0)
Immature Granulocytes: 0 %
Lymphocytes Relative: 30 %
Lymphs Abs: 3.4 10*3/uL (ref 0.7–4.0)
MCH: 29.5 pg (ref 26.0–34.0)
MCHC: 32.7 g/dL (ref 30.0–36.0)
MCV: 90.2 fL (ref 80.0–100.0)
Monocytes Absolute: 0.8 10*3/uL (ref 0.1–1.0)
Monocytes Relative: 7 %
Neutro Abs: 6.9 10*3/uL (ref 1.7–7.7)
Neutrophils Relative %: 61 %
Platelets: 331 10*3/uL (ref 150–400)
RBC: 4.48 MIL/uL (ref 3.87–5.11)
RDW: 13.5 % (ref 11.5–15.5)
WBC: 11.3 10*3/uL — ABNORMAL HIGH (ref 4.0–10.5)
nRBC: 0 % (ref 0.0–0.2)

## 2022-11-16 LAB — COMPREHENSIVE METABOLIC PANEL
ALT: 17 U/L (ref 0–44)
AST: 19 U/L (ref 15–41)
Albumin: 3.6 g/dL (ref 3.5–5.0)
Alkaline Phosphatase: 77 U/L (ref 38–126)
Anion gap: 7 (ref 5–15)
BUN: 18 mg/dL (ref 6–20)
CO2: 21 mmol/L — ABNORMAL LOW (ref 22–32)
Calcium: 8.5 mg/dL — ABNORMAL LOW (ref 8.9–10.3)
Chloride: 105 mmol/L (ref 98–111)
Creatinine, Ser: 0.69 mg/dL (ref 0.44–1.00)
GFR, Estimated: >60 mL/min (ref >60)
Glucose, Bld: 73 mg/dL (ref 70–99)
Potassium: 3.6 mmol/L (ref 3.5–5.1)
Sodium: 133 mmol/L — ABNORMAL LOW (ref 135–145)
Total Bilirubin: 0.3 mg/dL (ref 0.3–1.2)
Total Protein: 7.6 g/dL (ref 6.5–8.1)

## 2022-11-16 LAB — TROPONIN I (HIGH SENSITIVITY)
Troponin I (High Sensitivity): <2 ng/L (ref <18)
Troponin I (High Sensitivity): 2 ng/L (ref <18)

## 2022-11-16 LAB — LIPASE, BLOOD: Lipase: 52 U/L — ABNORMAL HIGH (ref 11–51)

## 2022-11-16 MED ORDER — DEXAMETHASONE SODIUM PHOSPHATE 10 MG/ML IJ SOLN
10.0000 mg | Freq: Once | INTRAMUSCULAR | Status: AC
  Administered 2022-11-16: 10 mg via INTRAVENOUS
  Filled 2022-11-16: qty 1

## 2022-11-16 MED ORDER — SODIUM CHLORIDE 0.9 % IV SOLN: INTRAVENOUS | Status: DC

## 2022-11-16 MED ORDER — DIPHENHYDRAMINE HCL 50 MG/ML IJ SOLN
25.0000 mg | Freq: Once | INTRAMUSCULAR | Status: AC
  Administered 2022-11-16: 25 mg via INTRAVENOUS
  Filled 2022-11-16: qty 1

## 2022-11-16 MED ORDER — ONDANSETRON HCL 4 MG/2ML IJ SOLN
4.0000 mg | Freq: Once | INTRAMUSCULAR | Status: AC
  Administered 2022-11-16: 4 mg via INTRAVENOUS
  Filled 2022-11-16: qty 2

## 2022-11-16 MED ORDER — KETOROLAC TROMETHAMINE 30 MG/ML IJ SOLN
30.0000 mg | Freq: Once | INTRAMUSCULAR | Status: AC
  Administered 2022-11-16: 30 mg via INTRAVENOUS
  Filled 2022-11-16: qty 1

## 2022-11-16 MED ORDER — METOCLOPRAMIDE HCL 5 MG/ML IJ SOLN
10.0000 mg | Freq: Once | INTRAMUSCULAR | Status: AC
  Administered 2022-11-16: 10 mg via INTRAVENOUS
  Filled 2022-11-16: qty 2

## 2022-11-16 MED ORDER — SODIUM CHLORIDE 0.9 % IV BOLUS
1000.0000 mL | Freq: Once | INTRAVENOUS | Status: AC
  Administered 2022-11-16: 1000 mL via INTRAVENOUS

## 2022-11-16 NOTE — ED Provider Notes (Addendum)
[facility] Provider Note   CSN: [id number] Arrival date & time: 11/16/22  1651     History  Chief Complaint  Patient presents with   Headache    Morgan Long is a 52 y.o. female.  Patient referred in by primary care doctor about persistent migraine problems for the past 2 weeks.  Migraine very typical kind of forehead area associated with nausea.  Pressure behind the eyes and light sensitivity.  That is all very common.  Patient also complaining of some anterior chest pain that just started within the last couple hours.  And also with complaint of some low back pain has been present for little bit longer.  No fall or injury.  Patient denies any numbness or weakness to upper extremities lower extremities.  But does state that both lower extremity legs feel heavy.  Patient does not have a history of complicated migraines.  Patient's primary care doctor is Dr. Laury Axon.  Past medical history is extensive TMJ dysfunctional syndrome endometriosis history of migraines gastroparesis history of stomach ulcer prediabetes.  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       Home Medications Prior to Admission medications   Medication Sig Start Date End Date Taking? Authorizing Provider  cetirizine (ZYRTEC) 10 MG tablet TAKE 1 TABLET BY MOUTH DAILY 05/10/18   Zola Button, Grayling Congress, DO  clindamycin (CLEOCIN T) 1 % external solution Apply topically 2 (two) times daily. 05/03/21   Seabron Spates R, DO  cyanocobalamin (,VITAMIN B-12,) 1000 MCG/ML injection INJECT 1 ML (1,000 MCG TOTAL) INTO THE MUSCLE ONCE FOR 1 DOSE. 11/14/21   Zola Button, Yvonne R, DO  ESTROGEL 0.75 MG/1.25 GM (0.06%) topical gel Apply 1 application topically daily. 05/18/15   [provider]  etonogestrel-ethinyl estradiol (NUVARING) 0.12-0.015 MG/24HR vaginal ring Place 1 each vaginally every 28 (twenty-eight) days. Insert vaginally and leave in place  for 3 consecutive weeks, then remove for 1 week.    [provider]  LINZESS 72 MCG capsule Take 72 mcg by mouth every morning. 03/30/22   [provider]  Multiple Vitamin (MULTIVITAMIN) LIQD Take 5 mLs by mouth daily.    [provider]  promethazine (PHENERGAN) 12.5 MG tablet Take 1 tablet (12.5 mg total) by mouth every 6 (six) hours as needed for nausea or vomiting. 08/23/22   Langston Reusing, MD  Semaglutide-Weight Management (WEGOVY) 0.5 MG/0.5ML SOAJ Inject 0.5 mg into the skin once a week. 10/05/22   Lowne Chase, Yvonne R, DO  [name] 50 MG TABS 1 PILL AT ONSET OF MIGRAINE, MAY REPEAT IN 2 HOURS, NO MORE THAN 2 PILLS IN 24 HOURS.). 10/24/22   Glean Salvo, NP  Vitamin D, Ergocalciferol, (DRISDOL) 1.25 MG (50000 UNIT) CAPS capsule Take 1 capsule (50,000 Units total) by mouth every 7 (seven) days. 10/09/22   Rayburn, Fanny Bien, PA-C      Allergies    Acetaminophen-codeine, Hydrocodone, Oxycodone, Tyloxapol, and Codeine    Review of Systems   Review of Systems  Constitutional:  Negative for chills and fever.  HENT:  Negative for ear pain and sore throat.   Eyes:  Positive for photophobia. Negative for pain and visual disturbance.  Respiratory:  Negative for cough and shortness of breath.   Cardiovascular:  Positive for chest pain. Negative for palpitations.  Gastrointestinal:  Positive for nausea and vomiting. Negative for abdominal pain.  Genitourinary:  Negative for dysuria and hematuria.  Musculoskeletal:  Positive for back pain. Negative for arthralgias.  Skin:  Negative for color change and rash.  Neurological:  Positive for headaches. Negative for seizures and syncope.  All other systems reviewed and are negative.   Physical Exam Updated Vital Signs BP 133/75 (BP Location: Left Arm)   Pulse 78   Temp 97.8 F (36.6 C) (Oral)   Resp 16   Ht 1.6 m (5\' 3" )   Wt 96.6 kg   SpO2 97%   BMI 37.72 kg/m  Physical Exam Vitals and nursing note  reviewed.  Constitutional:      General: She is not in acute distress.    Appearance: Normal appearance. She is well-developed.  HENT:     Head: Normocephalic and atraumatic.  Eyes:     Conjunctiva/sclera: Conjunctivae normal.  Cardiovascular:     Rate and Rhythm: Normal rate and regular rhythm.     Heart sounds: No murmur heard. Pulmonary:     Effort: Pulmonary effort is normal. No respiratory distress.     Breath sounds: Normal breath sounds.  Abdominal:     Palpations: Abdomen is soft.     Tenderness: There is no abdominal tenderness.  Musculoskeletal:        General: No swelling.     Cervical back: Neck supple. No rigidity.  Skin:    General: Skin is warm and dry.     Capillary Refill: Capillary refill takes less than 2 seconds.  Neurological:     General: No focal deficit present.     Mental Status: She is alert and oriented to person, place, and time.  Psychiatric:        Mood and Affect: Mood normal.     ED Results / Procedures / Treatments   Labs (all labs ordered are listed, but only abnormal results are displayed) Labs Reviewed  CBC WITH DIFFERENTIAL/PLATELET  COMPREHENSIVE METABOLIC PANEL  LIPASE, BLOOD  TROPONIN I (HIGH SENSITIVITY)    EKG EKG Interpretation  Date/Time:  Thursday November 16 2022 18:44:53 EDT Ventricular Rate:  88 PR Interval:  146 QRS Duration: 82 QT Interval:  363 QTC Calculation: 440 R Axis:   52 Text Interpretation: Sinus rhythm ST elev, probable normal early repol pattern Confirmed by Vanetta Mulders [id number]) on 11/16/2022 6:55:20 PM  Radiology No results found.  Procedures Procedures    Medications Ordered in ED Medications  0.9 %  sodium chloride infusion (has no administration in time range)  sodium chloride 0.9 % bolus 1,000 mL (has no administration in time range)  dexamethasone (DECADRON) injection 10 mg (has no administration in time range)  diphenhydrAMINE (BENADRYL) injection 25 mg (has no administration in time  range)  metoCLOPramide (REGLAN) injection 10 mg (has no administration in time range)    ED Course/ Medical Decision Making/ A&P                             Medical Decision Making Amount and/or Complexity of Data Reviewed Labs: ordered. Radiology: ordered.  Risk Prescription drug management.  Patient's EKG with sinus rhythm ST elevation probably early repull.  Nothing acute.  Based on the complaint of chest pain will get chest x-ray will get delta troponins.  And will get CBC lipase and complete metabolic panel based on her history of peptic ulcer disease and history of partial removal of hepatic lobe.  Regarding the migraine patient will get migraine cocktail will receive a liter of IV fluids will receive IV  Decadron IV Reglan and IV Benadryl.  And then will reassess.  Her migraine is very typical for her.  Nothing unusual about that.  CBC white count 11.3 hemoglobin 13.2 platelets are normal.  Initial troponin was less than 2 delta troponin will be required.  Complete metabolic panel sodium 133 potassium was good renal function good liver function test good.  Lipase up a little bit at 52 but does not seem to be up enough to be consistent with pancreatitis.  Liver function test were normal.  Chest x-ray no active disease.  Patient states headache is improving some she looks more comfortable.  But no significant improvement.  Delta troponin will be at 2100.  Awaiting delta troponin.  Patient feels that nausea is reoccurring.  Will go ahead and give a dose of Zofran IV as well as some IV Toradol.  At 2250 patient's headache further improved.  She feels comfortable with going home.  Hopefully the Decadron will eradicate the headache.  Patient's delta troponin was 2 so no concerns there.  Stable for discharge home and follow-up with her doctors.  Final Clinical Impression(s) / ED Diagnoses Final diagnoses:  Intractable migraine with status migrainosus, unspecified migraine type   Chest pain, unspecified type    Rx / DC Orders ED Discharge Orders     None         Vanetta Mulders, MD 11/16/22 Julian Reil    Vanetta Mulders, MD 11/16/22 1610    Vanetta Mulders, MD 11/16/22 2200    Vanetta Mulders, MD 11/16/22 2251

## 2022-11-16 NOTE — ED Notes (Signed)
Lab had called to state the 2nd troponin blood draw did not have enough blood in the tube. This RN has sent another sample.

## 2022-11-16 NOTE — ED Notes (Signed)
Pt A&OX4 ambulatory at d/c with independent steady gait. Pt verbalized understanding of d/c instructions and follow up care. Pt reports she is feeling better.

## 2022-11-16 NOTE — Telephone Encounter (Addendum)
[name] also gone for the afternoon. See mychart messages- Pt contacted neurology- they recommended she go to ED.

## 2022-11-16 NOTE — Telephone Encounter (Signed)
Pt states a month ago she had dental work done(dentist has only recently gave her antibiotics) for 2 weeks she has had a migraine and would like a call to discuss.

## 2022-11-16 NOTE — ED Triage Notes (Signed)
Neurologist told to come to ER today for Migraine x 2 weeks  Reports nausea and back pain as well  States pain intermittent throughout the day, pressure behind eyes

## 2022-11-16 NOTE — Telephone Encounter (Signed)
Called and relayed recommendations to patient, she said she would go to urgent care. Pt had no questions at this time but was encouraged to call back if questions arise.

## 2022-11-16 NOTE — Telephone Encounter (Signed)
Since it is unusual for her to present like this, I would recommend she go to urgent care or emergency room at this point.

## 2022-11-16 NOTE — Telephone Encounter (Signed)
Called pt, informed her of message nurse [facility] sent. Pt said it's time for my Botox and she wanted to scheduled Botox appointment. Scheduled pt for Botox with Maralyn Sago on 6/18.

## 2022-11-16 NOTE — Telephone Encounter (Signed)
Called and spoke to patient about migraine, she has been having it on and off for two weeks. It is worse when she lays down at night and first thing in the morning and occasionally she will have it throughout the day as well. She has been alternating tylenol/ibuprofen and tried [place] multiple times but nothing has helped. Reports the pain is in her temples and hurts to talk.

## 2022-11-16 NOTE — Discharge Instructions (Signed)
Workup for the chest pain without any acute findings.  Regarding the migraine Go home and rest in a dark room hopefully the Decadron will abate it by morning.  Make an appointment to follow-up with your doctors as needed return for new or worse symptoms.

## 2022-11-16 NOTE — ED Notes (Signed)
Pt tearful states now having chest pain, EKG ordered, EMT at bedside to obtain

## 2022-11-16 NOTE — ED Notes (Signed)
Lab called to report insufficient quantity for troponin. Primary RN aware; will recollect in a moment.

## 2022-11-16 NOTE — Telephone Encounter (Signed)
Pt called to see if an order could be placed for a toradol shot since she has been headaches for the last week and a half. PCP is not in the office so she wants to know if Esperanza Richters could place order. Please call and advise if she is able to come in this week and get shot.

## 2022-11-21 ENCOUNTER — Ambulatory Visit: Payer: BC Managed Care – PPO | Admitting: Neurology

## 2022-11-21 ENCOUNTER — Encounter: Payer: Self-pay | Admitting: Neurology

## 2022-11-21 VITALS — BP 116/72 | HR 90 | Ht 63.0 in | Wt 219.0 lb

## 2022-11-21 DIAGNOSIS — G43719 Chronic migraine without aura, intractable, without status migrainosus: Secondary | ICD-10-CM | POA: Diagnosis not present

## 2022-11-21 DIAGNOSIS — G43709 Chronic migraine without aura, not intractable, without status migrainosus: Secondary | ICD-10-CM

## 2022-11-21 MED ORDER — ONABOTULINUMTOXINA 200 UNITS IJ SOLR
155.0000 [IU] | Freq: Once | INTRAMUSCULAR | Status: AC
Start: 2022-11-21 — End: 2022-11-21
  Administered 2022-11-21: 140 [IU] via INTRAMUSCULAR

## 2022-11-21 NOTE — Progress Notes (Signed)
   BOTOX PROCEDURE NOTE FOR MIGRAINE HEADACHE  HISTORY: Here today for Botox for chronic migraine headaches.  Last injection was 08/01/22 by me.  Was in the ER last week for migraine headache given IV fluids Decadron, Reglan, Benadryl. Has had some dental work done, root canal, bone grafting in healing phases, is months long process. With Botox no migraines. No recent cosmetic Botox. Has Bernita Raisin, it has helped. Botox has worn off the last 2 weeks. Wearing invisalign for 2 more week, contributing to TMJ? Clenching?   Description of procedure:  The patient was placed in a sitting position. The standard protocol was used for Botox as follows, with 5 units of Botox injected at each site:  -Procerus muscle, midline injection  -Corrugator muscle, bilateral injection  -Frontalis muscle, bilateral injection, with 2 sites each side, medial injection was performed in the upper one third of the frontalis muscle, in the region vertical from the medial inferior edge of the superior orbital rim. The lateral injection was again in the upper one third of the forehead vertically above the lateral limbus of the cornea, 1.5 cm lateral to the medial injection site.  -Temporalis muscle injection, 4 sites, bilaterally. The first injection was 3 cm above the tragus of the ear, second injection site was 1.5 cm to 3 cm up from the first injection site in line with the tragus of the ear. The third injection site was 1.5-3 cm forward between the first 2 injection sites. The fourth injection site was 1.5 cm posterior to the second injection site.  -Occipitalis muscle injection, 3 sites, bilaterally. The first injection was done one half way between the occipital protuberance and the tip of the mastoid process behind the ear. The second injection site was done lateral and superior to the first, 1 fingerbreadth from the first injection. The third injection site was 1 fingerbreadth superiorly and medially from the first injection  site.  -Cervical paraspinal muscle injection, 2 sites, bilateral, the first injection site was 1 cm from the midline of the cervical spine, 3 cm inferior to the lower border of the occipital protuberance. The second injection site was 1.5 cm superiorly and laterally to the first injection site.  -Trapezius muscle injection was performed at 3 sites, bilaterally. The first injection site was in the upper trapezius muscle halfway between the inflection point of the neck, and the acromion. The second injection site was one half way between the acromion and the first injection site. The third injection was done between the first injection site and the inflection point of the neck.   A 200 unit bottle of Botox was used, 140 units were injected, the rest of the Botox was wasted. The patient tolerated the procedure well, there were no complications of the above procedure.  Botox NDC [phone] Lot number [id number] Expiration date 01/2025 BB  I did not do the corrugator or procerus injection due to recurrent cosmetic injection, coming up in a few weeks. Also advised her to try [name] 100 mg if acute migraine occurs, if helpful can update rx.

## 2022-11-21 NOTE — Progress Notes (Signed)
Botox- 200 units x 1 vial Lot: [id number] Expiration: 08.2026 NDC: 0023-3921-02  Bacteriostatic 0.9% Sodium Chloride- 4mL  Lot: [id number] Expiration: 04.01.2025 NDC: 1191478295  Dx: g31.709 B/B Witnessed by Aundra Millet, CMA

## 2022-12-05 DIAGNOSIS — M79661 Pain in right lower leg: Secondary | ICD-10-CM | POA: Diagnosis not present

## 2022-12-05 DIAGNOSIS — M79605 Pain in left leg: Secondary | ICD-10-CM | POA: Diagnosis not present

## 2022-12-05 DIAGNOSIS — L821 Other seborrheic keratosis: Secondary | ICD-10-CM | POA: Diagnosis not present

## 2022-12-05 DIAGNOSIS — M79604 Pain in right leg: Secondary | ICD-10-CM | POA: Diagnosis not present

## 2022-12-05 DIAGNOSIS — M79662 Pain in left lower leg: Secondary | ICD-10-CM | POA: Diagnosis not present

## 2022-12-05 DIAGNOSIS — I872 Venous insufficiency (chronic) (peripheral): Secondary | ICD-10-CM | POA: Diagnosis not present

## 2022-12-05 DIAGNOSIS — H019 Unspecified inflammation of eyelid: Secondary | ICD-10-CM | POA: Diagnosis not present

## 2022-12-09 ENCOUNTER — Other Ambulatory Visit (INDEPENDENT_AMBULATORY_CARE_PROVIDER_SITE_OTHER): Payer: Self-pay | Admitting: Physician Assistant

## 2022-12-09 DIAGNOSIS — E559 Vitamin D deficiency, unspecified: Secondary | ICD-10-CM

## 2022-12-11 DIAGNOSIS — I872 Venous insufficiency (chronic) (peripheral): Secondary | ICD-10-CM | POA: Diagnosis not present

## 2022-12-11 DIAGNOSIS — M79669 Pain in unspecified lower leg: Secondary | ICD-10-CM | POA: Diagnosis not present

## 2022-12-15 ENCOUNTER — Encounter: Payer: Self-pay | Admitting: Family Medicine

## 2022-12-15 ENCOUNTER — Other Ambulatory Visit: Payer: Self-pay | Admitting: Family Medicine

## 2022-12-15 DIAGNOSIS — R110 Nausea: Secondary | ICD-10-CM

## 2022-12-15 MED ORDER — PROMETHAZINE HCL 12.5 MG PO TABS
12.5000 mg | ORAL_TABLET | Freq: Four times a day (QID) | ORAL | 0 refills | Status: DC | PRN
Start: 2022-12-15 — End: 2023-05-11

## 2023-01-04 ENCOUNTER — Encounter: Payer: Self-pay | Admitting: Physician Assistant

## 2023-01-04 ENCOUNTER — Ambulatory Visit: Payer: BC Managed Care – PPO | Admitting: Family Medicine

## 2023-01-04 ENCOUNTER — Other Ambulatory Visit (HOSPITAL_COMMUNITY)
Admission: RE | Admit: 2023-01-04 | Discharge: 2023-01-04 | Disposition: A | Discharge status: Home / Self Care | Payer: BC Managed Care – PPO | Source: Ambulatory Visit | Attending: Physician Assistant | Admitting: Physician Assistant

## 2023-01-04 ENCOUNTER — Ambulatory Visit: Payer: BC Managed Care – PPO | Admitting: Physician Assistant

## 2023-01-04 VITALS — BP 116/72 | HR 98 | Temp 98.1°F | Resp 20 | Ht 63.0 in | Wt 219.0 lb

## 2023-01-04 DIAGNOSIS — R82998 Other abnormal findings in urine: Secondary | ICD-10-CM | POA: Diagnosis not present

## 2023-01-04 DIAGNOSIS — N898 Other specified noninflammatory disorders of vagina: Secondary | ICD-10-CM | POA: Insufficient documentation

## 2023-01-04 DIAGNOSIS — R300 Dysuria: Secondary | ICD-10-CM

## 2023-01-04 DIAGNOSIS — H6121 Impacted cerumen, right ear: Secondary | ICD-10-CM

## 2023-01-04 LAB — POCT URINALYSIS DIP (MANUAL ENTRY)
Bilirubin, UA: NEGATIVE
Blood, UA: NEGATIVE
Glucose, UA: NEGATIVE mg/dL
Ketones, POC UA: NEGATIVE mg/dL
Nitrite, UA: NEGATIVE
Protein Ur, POC: NEGATIVE mg/dL
Spec Grav, UA: 1.01 (ref 1.010–1.025)
Urobilinogen, UA: 0.2 E.U./dL
pH, UA: 5 (ref 5.0–8.0)

## 2023-01-04 MED ORDER — NITROFURANTOIN MONOHYD MACRO 100 MG PO CAPS
100.0000 mg | ORAL_CAPSULE | Freq: Two times a day (BID) | ORAL | 0 refills | Status: AC
Start: 2023-01-04 — End: 2023-01-09

## 2023-01-04 NOTE — Progress Notes (Signed)
Established patient visit   Patient: Morgan Long   DOB: 10-03-1970   52 y.o. Female  MRN: [id number] Visit Date: 01/04/2023  Today's healthcare provider: Alfredia Ferguson, PA-C   Chief Complaint  Patient presents with   EAR CLEANING    Seen Morgan Long had softener put in ears and back for cleaning   Urine    Dark urine patient states lighter today    Subjective    HPI  Pt was told she has cerumen impaction, told to use earwax softeners and return. Denies ear pain, reports some ear itching. Reports lower abd pressure, dark urine, x 1-2 weeks. Some dysuria denies hematuria. Reports some vaginal itching/discharge.   Medications: Outpatient Medications Prior to Visit  Medication Sig   cetirizine (ZYRTEC) 10 MG tablet TAKE 1 TABLET BY MOUTH DAILY   clindamycin (CLEOCIN T) 1 % external solution Apply topically 2 (two) times daily.   cyanocobalamin (,VITAMIN B-12,) 1000 MCG/ML injection INJECT 1 ML (1,000 MCG TOTAL) INTO THE MUSCLE ONCE FOR 1 DOSE.   ESTROGEL 0.75 MG/1.25 GM (0.06%) topical gel Apply 1 application topically daily.   etonogestrel-ethinyl estradiol (NUVARING) 0.12-0.015 MG/24HR vaginal ring Place 1 each vaginally every 28 (twenty-eight) days. Insert vaginally and leave in place for 3 consecutive weeks, then remove for 1 week.   LINZESS 72 MCG capsule Take 72 mcg by mouth every morning.   Multiple Vitamin (MULTIVITAMIN) LIQD Take 5 mLs by mouth daily.   promethazine (PHENERGAN) 12.5 MG tablet Take 1 tablet (12.5 mg total) by mouth every 6 (six) hours as needed for nausea or vomiting.   [name] 50 MG TABS 1 PILL AT ONSET OF MIGRAINE, MAY REPEAT IN 2 HOURS, NO MORE THAN 2 PILLS IN 24 HOURS.).   Vitamin D, Ergocalciferol, (DRISDOL) 1.25 MG (50000 UNIT) CAPS capsule Take 1 capsule (50,000 Units total) by mouth every 7 (seven) days.   [DISCONTINUED] Semaglutide-Weight Management (WEGOVY) 0.5 MG/0.5ML SOAJ Inject 0.5 mg into the skin once a week.   No facility-administered  medications prior to visit.    Review of Systems  Constitutional:  Negative for fatigue and fever.  Respiratory:  Negative for cough and shortness of breath.   Cardiovascular:  Negative for chest pain and leg swelling.  Gastrointestinal:  Negative for abdominal pain.  Genitourinary:  Positive for dysuria and vaginal discharge.  Neurological:  Negative for dizziness and headaches.       Objective    BP 116/72 (BP Location: Left Arm, Patient Position: Sitting, Cuff Size: Normal)   Pulse 98   Temp 98.1 F (36.7 C) (Oral)   Resp 20   Ht 5\' 3"  (1.6 m)   Wt 219 lb (99.3 kg)   SpO2 97%   BMI 38.79 kg/m   Physical Exam Vitals reviewed.  Constitutional:      Appearance: She is not ill-appearing.  HENT:     Head: Normocephalic.     Right Ear: There is impacted cerumen.     Left Ear: Tympanic membrane normal. There is no impacted cerumen.  Eyes:     Conjunctiva/sclera: Conjunctivae normal.  Cardiovascular:     Rate and Rhythm: Normal rate.  Pulmonary:     Effort: Pulmonary effort is normal. No respiratory distress.  Abdominal:     Palpations: Abdomen is soft.     Tenderness: There is abdominal tenderness in the suprapubic area and left lower quadrant.  Neurological:     General: No focal deficit present.     Mental Status:  She is alert and oriented to person, place, and time.  Psychiatric:        Mood and Affect: Mood normal.        Behavior: Behavior normal.      Results for orders placed or performed in visit on 01/04/23  POCT urinalysis dipstick  Result Value Ref Range   Color, UA yellow yellow   Clarity, UA clear clear   Glucose, UA negative negative mg/dL   Bilirubin, UA negative negative   Ketones, POC UA negative negative mg/dL   Spec Grav, UA 1.610 9.604 - 1.025   Blood, UA negative negative   pH, UA 5.0 5.0 - 8.0   Protein Ur, POC negative negative mg/dL   Urobilinogen, UA 0.2 0.2 or 1.0 E.U./dL   Nitrite, UA Negative Negative   Leukocytes, UA Trace (A)  Negative    Assessment & Plan     1. Impacted cerumen of right ear Flushed today w/ hydrogen peroxide and water by CMA, Morgan Long.  Clear on exam. I used a curette to get the last bit from her right ear.  Pt tolerated procedure well.   2. Dysuria Trace leuks, ordered culture, will tx with macrobid.  - Urine Culture - nitrofurantoin, macrocrystal-monohydrate, (MACROBID) 100 MG capsule; Take 1 capsule (100 mg total) by mouth 2 (two) times daily for 5 days.  Dispense: 10 capsule; Refill: 0  3. Vaginal discharge Rec swab to r/o g/c, yeast, trich, bv. Pt amenable to self swab.  - Cervicovaginal ancillary only( [place])  4. Dark urine - POCT urinalysis dipstick   Return if symptoms worsen or fail to improve.      I, Morgan Ferguson, PA-C have reviewed all documentation for this visit. The documentation on  01/04/23   for the exam, diagnosis, procedures, and orders are all accurate and complete.    Morgan Ferguson, PA-C  [facility] [phone] (phone) [phone] (fax)  [facility]

## 2023-01-10 DIAGNOSIS — D1803 Hemangioma of intra-abdominal structures: Secondary | ICD-10-CM | POA: Diagnosis not present

## 2023-01-10 DIAGNOSIS — D134 Benign neoplasm of liver: Secondary | ICD-10-CM | POA: Diagnosis not present

## 2023-01-10 DIAGNOSIS — Z9889 Other specified postprocedural states: Secondary | ICD-10-CM | POA: Diagnosis not present

## 2023-01-12 DIAGNOSIS — M722 Plantar fascial fibromatosis: Secondary | ICD-10-CM | POA: Diagnosis not present

## 2023-01-16 DIAGNOSIS — D2262 Melanocytic nevi of left upper limb, including shoulder: Secondary | ICD-10-CM | POA: Diagnosis not present

## 2023-01-16 DIAGNOSIS — M722 Plantar fascial fibromatosis: Secondary | ICD-10-CM | POA: Diagnosis not present

## 2023-01-16 DIAGNOSIS — L239 Allergic contact dermatitis, unspecified cause: Secondary | ICD-10-CM | POA: Diagnosis not present

## 2023-01-16 DIAGNOSIS — S1082XA Blister (nonthermal) of other specified part of neck, initial encounter: Secondary | ICD-10-CM | POA: Diagnosis not present

## 2023-01-20 ENCOUNTER — Other Ambulatory Visit: Payer: Self-pay | Admitting: Family Medicine

## 2023-01-23 ENCOUNTER — Telehealth: Payer: Self-pay

## 2023-01-23 NOTE — Telephone Encounter (Signed)
Faxed botox continuation order to [facility]

## 2023-01-24 NOTE — Telephone Encounter (Signed)
Received approval from [employer]: Auth#: [id number] (01/23/23-12/25/23)

## 2023-01-28 ENCOUNTER — Encounter: Payer: Self-pay | Admitting: Neurology

## 2023-01-30 ENCOUNTER — Encounter: Payer: Self-pay | Admitting: Family Medicine

## 2023-01-30 ENCOUNTER — Ambulatory Visit: Payer: BC Managed Care – PPO | Admitting: Family Medicine

## 2023-01-30 VITALS — BP 120/70 | HR 90 | Temp 98.9°F | Resp 18 | Ht 63.0 in | Wt 220.6 lb

## 2023-01-30 DIAGNOSIS — G43001 Migraine without aura, not intractable, with status migrainosus: Secondary | ICD-10-CM

## 2023-01-30 DIAGNOSIS — J3089 Other allergic rhinitis: Secondary | ICD-10-CM | POA: Diagnosis not present

## 2023-01-30 LAB — POCT RAPID STREP A (OFFICE): Rapid Strep A Screen: NEGATIVE

## 2023-01-30 LAB — POC COVID19 BINAXNOW: SARS Coronavirus 2 Ag: NEGATIVE

## 2023-01-30 MED ORDER — TRIAMCINOLONE ACETONIDE 55 MCG/ACT NA AERO
2.0000 | INHALATION_SPRAY | Freq: Every day | NASAL | 12 refills | Status: DC
Start: 2023-01-30 — End: 2023-05-17

## 2023-01-30 MED ORDER — TIZANIDINE HCL 2 MG PO TABS: 2.0000 mg | ORAL_TABLET | Freq: Three times a day (TID) | ORAL | 0 refills | Status: DC | PRN

## 2023-01-30 MED ORDER — CETIRIZINE HCL 10 MG PO TABS: 10.0000 mg | ORAL_TABLET | Freq: Every day | ORAL | 5 refills | Status: DC

## 2023-01-30 MED ORDER — KETOROLAC TROMETHAMINE 60 MG/2ML IM SOLN
60.0000 mg | Freq: Once | INTRAMUSCULAR | Status: AC
Start: 2023-01-30 — End: 2023-01-30
  Administered 2023-01-30: 60 mg via INTRAMUSCULAR

## 2023-01-30 NOTE — Progress Notes (Unsigned)
+ ++  Established Patient Office Visit  Subjective   Patient ID: Morgan Long, female    DOB: [birth year]  Age: 52 y.o. MRN: [id number]  Chief Complaint  Patient presents with   Sore Throat    X2 weeks, pt states having sore throat and having hoariness in the morning, and productive cough    HPI Discussed the use of AI scribe software for clinical note transcription with the patient, who gave verbal consent to proceed.  History of Present Illness   The patient presents with a week-long history of intermittent throat itching and coughing, which she initially attributed to exposure to a sick niece or nephew. She also reports waking up with hoarseness that improves after an hour or two. They have been told she snores when tired. She denies dysphagia and pain. She has been using a saline gel for nasal dryness, but denies using any antihistamines recently.  The patient also reports headaches, which she attributes to the need for her upcoming Botox injections. The headaches are daily but not intense, and are worse at night. She has previously found relief with Toradol injections.  The patient also mentions a history of weight loss, and expresses interest in obtaining a weight loss medication, but notes difficulty in obtaining it due to insurance issues.       Patient Active Problem List   Diagnosis Date Noted   Other hyperlipidemia 10/09/2022   BMI 37.0-37.9, adult Current BMI 37.7 10/09/2022   Acute cough 07/18/2022   Polyphagia 06/15/2022   Right groin mass 03/28/2022   Impacted cerumen of left ear 10/13/2021   Chronic constipation 10/04/2021   At risk for impaired metabolic function 10/04/2021   Allergic conjunctivitis of both eyes 08/25/2021   B12 deficiency 06/23/2021   Prediabetes 06/23/2021   Vitamin D deficiency 06/23/2021   Hematoma 05/25/2021   Changing skin lesion 05/13/2021   Frequent PVCs 12/30/2020   DOE (dyspnea on exertion) 12/30/2020   Allergic contact  dermatitis due to cosmetics 12/10/2020   Newly recognized murmur 12/10/2020   Herpes zoster 11/12/2020   Preventative health care 11/04/2020   Shortness of breath    COVID-19 09/13/2020   Adhesive capsulitis of left shoulder 08/04/2020   Pain in joint of left shoulder 08/03/2020   Hepatic adenoma 06/08/2020   Liver lesion 05/04/2020   TMJ pain dysfunction syndrome    Tired    Sinus complaint    SOB (shortness of breath)    Pain in right ankle and joints of right foot    Overweight    Muscle weakness (generalized)    Migraine    Localized edema    Lactose intolerance    History of stomach ulcers    Hepatic steatosis    Heartburn    Fatigue    Dry skin    Dry eye syndrome of both lacrimal glands    Constipation    Chronic migraine    Asthma    History of COVID-19 01/05/2020   Anxiety 05/12/2019   Abnormality of lung on CXR 03/06/2019   Moderate persistent asthma 01/23/2019   Lower extremity edema 01/23/2019   Capsulitis of ankle, left 09/24/2018   Synovitis of left ankle 09/24/2018   Ankle injury, initial encounter 05/23/2018   Acute pain of left shoulder 03/12/2018   S/P repair of ligament of ankle 03/05/2018   Chronic pain of right ankle 10/28/2017   Other fatigue 08/14/2017   Dyspnea 08/14/2017   Other insomnia 08/14/2017   Medication management 06/12/2017  Nonintractable headache 06/12/2017   Gastroparesis 06/12/2017   Class 2 severe obesity with serious comorbidity and body mass index (BMI) of 35.0 to 35.9 in adult (HCC) 04/25/2017   Leukocytosis 03/01/2017   Generalized obesity 03/01/2017   Chronic migraine without aura without status migrainosus, not intractable 03/01/2017   Sinusitis 07/31/2016   Influenza-like illness 07/12/2016   Peroneal tendinitis of left lower extremity 11/03/2015   Pes anserine bursitis 11/03/2015   Asthma with acute exacerbation 06/15/2015   Acute sinusitis 06/15/2015   GERD (gastroesophageal reflux disease) 01/13/2015   Myalgia  and myositis 07/22/2014   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 Clarisse Gouge   Constipation    Dry eye syndrome of both lacrimal glands    Dry skin    Endometriosis    Fatigue    Gastroparesis    GERD (gastroesophageal reflux disease)    Headache(784.0)    occasional, dx w/ Migraines before, Topamax helps   Heartburn    Hepatic steatosis    History of stomach ulcers    Lactose intolerance    Localized edema    Migraine    Muscle weakness (generalized)    Nuclear cataract of both eyes    Mild   Overweight    Pain in right ankle and joints of right foot    Prediabetes    Shortness of breath    Sinus complaint    Sinusitis    Tired    TMJ pain dysfunction syndrome    occasional   Vitamin D deficiency    Past Surgical History:  Procedure Laterality Date   ANKLE SURGERY Left 11/16/2017   AUGMENTATION MAMMAPLASTY Bilateral 2006   BREAST ENHANCEMENT SURGERY  2006   BUNIONECTOMY     COLONOSCOPY     endrometroisis     fallopian tube removed     Left   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   UPPER GASTROINTESTINAL ENDOSCOPY     [facility] teeth extraction     Social History   Tobacco Use   Smoking status: Never    Passive exposure: Never   Smokeless tobacco: Never  Vaping Use   Vaping status: Never Used  Substance Use Topics   Alcohol use: Not Currently    Comment: socially - occasional    Drug use: No   Social History   Socioeconomic History   Marital status:  Single    Spouse name: Not on file   Number of children: 0   Years of education: BS   Highest education level: Master's degree (e.g., MA, MS, MEng, MEd, MSW, MBA)  Occupational History   Occupation: Sports coach, going to school    Employer: [employer]   Occupation: Wellsite geologist  Tobacco Use   Smoking status: Never    Passive exposure: Never   Smokeless tobacco: Never  Vaping Use   Vaping status: Never Used  Substance and Sexual Activity   Alcohol use: Not  Currently    Comment: socially - occasional    Drug use: No   Sexual activity: Yes    Birth control/protection: Inserts    Comment: nuvaring  Other Topics Concern   Not on file  Social History Narrative   Household:sister and her 3 kids    Drinks occasional starbucks drink       Social Determinants of Health   Financial Resource Strain: Patient Declined (09/29/2022)   Overall Financial Resource Strain (CARDIA)    Difficulty of Paying Living Expenses: Patient declined  Food Insecurity: Food Insecurity Present (09/29/2022)   Hunger Vital Sign    Worried About Running Out of Food in the Last Year: Patient declined    Ran Out of Food in the Last Year: Sometimes true  Transportation Needs: No Transportation Needs (09/29/2022)   PRAPARE - Administrator, Civil Service (Medical): No    Lack of Transportation (Non-Medical): No  Physical Activity: Insufficiently Active (09/29/2022)   Exercise Vital Sign    Days of Exercise per Week: 2 days    Minutes of Exercise per Session: 50 min  Stress: No Stress Concern Present (09/29/2022)   [employer] of Occupational Health - Occupational Stress Questionnaire    Feeling of Stress : Only a little  Social Connections: Moderately Integrated (09/29/2022)   Social Connection and Isolation Panel [NHANES]    Frequency of Communication with Friends and Family: More than three times a week    Frequency of Social Gatherings with Friends and Family: Once a week     Attends Religious Services: 1 to 4 times per year    Active Member of [employer] or Organizations: Yes    Attends Banker Meetings: 1 to 4 times per year    Marital Status: Never married  Catering manager Violence: Not on file   Family Status  Relation Name Status   Mother  Deceased at age 21       throat cancer   Father  Deceased at age 67       MI   PGF  (Not Specified)   Mat Aunt  Deceased   Mat Aunt  Deceased   Mat Aunt  Deceased   Mat Aunt  Deceased   Mat Aunt  Alive   Mat Aunt  Alive   Mat Aunt  (Not Specified)   Mat Aunt  (Not Specified)   Mat Aunt  (Not Specified)   Mat Uncle  Deceased       throat   Mat Uncle  Deceased       pancreatic   Mat Uncle  (Not Specified)   Other  (Not Specified)   Other  (Not Specified)   Neg Hx  (Not Specified)  No partnership data on file   Family History  Problem Relation Age of Onset   Throat cancer Mother    Cancer - Other Mother 65       throat - died 6 months   Cancer Mother    Alcoholism Mother    Stroke Father    Heart disease Father    Cancer Father    Colon cancer Paternal Grandfather    Breast cancer Maternal Aunt        breast   Breast cancer Maternal Aunt        breast   Pancreatic cancer Maternal Aunt    Hypertension Maternal Aunt        several family members   Breast cancer Maternal Aunt  total of 5 aunts   Breast cancer Maternal Aunt    Colon cancer Maternal Uncle 44       died 75   Throat cancer Maternal Uncle    Diabetes Other        grandmother   Asthma Other        cousin, maternal   Heart attack Neg Hx    Rectal cancer Neg Hx    Stomach cancer Neg Hx    Allergies  Allergen Reactions   Acetaminophen-Codeine Itching   Hydrocodone Nausea Only   Oxycodone Itching   Tyloxapol Other (See Comments)    Other reaction(s): Unknown   Codeine Itching and Nausea Only      Review of Systems  Constitutional:  Negative for chills, fever and malaise/fatigue.  HENT:  Negative for  congestion and hearing loss.   Eyes:  Negative for discharge.  Respiratory:  Negative for cough, sputum production and shortness of breath.   Cardiovascular:  Negative for chest pain, palpitations and leg swelling.  Gastrointestinal:  Negative for abdominal pain, blood in stool, constipation, diarrhea, heartburn, nausea and vomiting.  Genitourinary:  Negative for dysuria, frequency, hematuria and urgency.  Musculoskeletal:  Negative for back pain, falls and myalgias.  Skin:  Negative for rash.  Neurological:  Negative for dizziness, sensory change, loss of consciousness, weakness and headaches.  Endo/Heme/Allergies:  Negative for environmental allergies. Does not bruise/bleed easily.  Psychiatric/Behavioral:  Negative for depression and suicidal ideas. The patient is not nervous/anxious and does not have insomnia.       Objective:     BP 120/70 (BP Location: Left Arm, Patient Position: Sitting, Cuff Size: Normal)   Pulse 90   Temp 98.9 F (37.2 C) (Oral)   Resp 18   Ht 5\' 3"  (1.6 m)   Wt 220 lb 9.6 oz (100.1 kg)   SpO2 98%   BMI 39.08 kg/m  BP Readings from Last 3 Encounters:  01/30/23 120/70  01/04/23 116/72  11/21/22 116/72   Wt Readings from Last 3 Encounters:  01/30/23 220 lb 9.6 oz (100.1 kg)  01/04/23 219 lb (99.3 kg)  11/21/22 219 lb (99.3 kg)   SpO2 Readings from Last 3 Encounters:  01/30/23 98%  01/04/23 97%  11/21/22 93%      Physical Exam Vitals and nursing note reviewed.  Constitutional:      General: She is not in acute distress.    Appearance: Normal appearance. She is well-developed.  HENT:     Head: Normocephalic and atraumatic.     Mouth/Throat:     Pharynx: No pharyngeal swelling or posterior oropharyngeal erythema.     Tonsils: No tonsillar exudate or tonsillar abscesses.  Eyes:     General: No scleral icterus.       Right eye: No discharge.        Left eye: No discharge.  Cardiovascular:     Rate and Rhythm: Normal rate and regular  rhythm.     Heart sounds: No murmur heard. Pulmonary:     Effort: Pulmonary effort is normal. No respiratory distress.     Breath sounds: Normal breath sounds.  Musculoskeletal:        General: Normal range of motion.     Cervical back: Normal range of motion and neck supple.     Right lower leg: No edema.     Left lower leg: No edema.  Skin:    General: Skin is warm and dry.  Neurological:     Mental Status:  She is alert and oriented to person, place, and time.  Psychiatric:        Mood and Affect: Mood normal.        Behavior: Behavior normal.        Thought Content: Thought content normal.        Judgment: Judgment normal.      No results found for any visits on 01/30/23.  Last CBC Lab Results  Component Value Date   WBC 11.3 (H) 11/16/2022   HGB 13.2 11/16/2022   HCT 40.4 11/16/2022   MCV 90.2 11/16/2022   MCH 29.5 11/16/2022   RDW 13.5 11/16/2022   PLT 331 11/16/2022   Last metabolic panel Lab Results  Component Value Date   GLUCOSE 73 11/16/2022   NA 133 (L) 11/16/2022   K 3.6 11/16/2022   CL 105 11/16/2022   CO2 21 (L) 11/16/2022   BUN 18 11/16/2022   CREATININE 0.69 11/16/2022   GFRNONAA >60 11/16/2022   CALCIUM 8.5 (L) 11/16/2022   PROT 7.6 11/16/2022   ALBUMIN 3.6 11/16/2022   LABGLOB 3.1 09/25/2022   AGRATIO 1.4 09/25/2022   BILITOT 0.3 11/16/2022   ALKPHOS 77 11/16/2022   AST 19 11/16/2022   ALT 17 11/16/2022   ANIONGAP 7 11/16/2022   Last lipids Lab Results  Component Value Date   CHOL 212 (H) 09/25/2022   HDL 74 09/25/2022   LDLCALC 124 (H) 09/25/2022   TRIG 77 09/25/2022   CHOLHDL 2.7 07/07/2021   Last hemoglobin A1c Lab Results  Component Value Date   HGBA1C 5.6 09/25/2022   Last thyroid functions Lab Results  Component Value Date   TSH 0.697 02/16/2022   T3TOTAL 167 02/02/2021   Last vitamin D Lab Results  Component Value Date   VD25OH 50.7 09/25/2022   Last vitamin B12 and Folate Lab Results  Component Value Date    VITAMINB12 1,316 (H) 02/16/2022   FOLATE 6.0 02/02/2021      The 10-year ASCVD risk score (Arnett DK, et al., 2019) is: 4.3%   Assessment and Plan    Throat Irritation Reports of intermittent throat itchiness and morning hoarseness. Possible snoring at night. No pain on swallowing. Throat examination unremarkable. Possible exposure to sick relatives. -Continue Zyrtec as previously prescribed. -Start new nasal spray prescription. -Perform swab tests for strep and COVID-19 to rule out infection.  Migraine Reports of headaches, likely related to the nearing due date for Botox injections. Botox has been effective in managing migraines for the past three months. -Administer Toradol injection today for headache relief. -Continue scheduled Botox injections for migraine management.  Weight Loss Discussion of previous difficulties in obtaining Wegovy and Zepbound due to insurance coverage. Possible future coverage for pre-diabetic condition. -Continue current weight management strategies. -Reevaluate possibility of obtaining Wegovy or Zepbound in the new year based on insurance coverage changes.       Assessment & Plan:   Problem List Items Addressed This Visit   None   No follow-ups on file.    Donato Schultz, DO

## 2023-01-31 NOTE — Patient Instructions (Signed)
Postnasal Drip Postnasal drip is the feeling of mucus going down the back of your throat. Mucus is a slimy substance that moistens and cleans your nose and throat, as well as the air pockets in face bones near your forehead and cheeks (sinuses). Small amounts of mucus pass from your nose and sinuses down the back of your throat all the time. This is normal. When you produce too much mucus or the mucus gets too thick, you can feel it. Some common causes of postnasal drip include: Having more mucus because of: A cold or the flu. Allergies. Cold air. Certain medicines. Gastroesophageal reflux. Having more mucus that is thicker because of: A sinus or nasal infection. Dry air. A food allergy. Follow these instructions at home: Relieving discomfort  Gargle with a mixture of salt and water 3-4 times a day or as needed. To make salt water, completely dissolve -1 tsp (3-6 g) of salt in 1 cup (237 mL) of warm water. If the air in your home is dry, use a humidifier to add moisture to the air. Use a saline spray or a container (neti pot) to flush out the nose (nasal irrigation). These methods can help clear away mucus and keep the nasal passages moist. General instructions Take over-the-counter and prescription medicines only as told by your health care provider. Follow instructions from your health care provider about eating or drinking restrictions. You may need to avoid caffeine. Avoid things that you know you are allergic to (allergens), like dust, mold, pollen, pets, or certain foods. Drink enough fluid to keep your urine pale yellow. Keep all follow-up visits. This is important. Contact a health care provider if: You have a fever. You have a sore throat or difficulty swallowing. You have a headache. You have sinus or ear pain. You have a cough that does not go away. The mucus from your nose becomes thick and is green or yellow in color. You have cold or flu symptoms that last more than 10  days. Summary Postnasal drip is the feeling of mucus going down the back of your throat. Use nasal irrigation or a nasal spray to help clear away mucus and keep the nasal passages moist. Avoid things that you know you are allergic to (allergens), like dust, mold, pollen, pets, or certain foods. This information is not intended to replace advice given to you by your health care provider. Make sure you discuss any questions you have with your health care provider. Document Revised: 04/21/2021 Document Reviewed: 04/21/2021 Elsevier Patient Education  2024 [employer]  

## 2023-02-07 ENCOUNTER — Encounter: Payer: Self-pay | Admitting: Family Medicine

## 2023-02-12 ENCOUNTER — Other Ambulatory Visit (HOSPITAL_BASED_OUTPATIENT_CLINIC_OR_DEPARTMENT_OTHER): Payer: Self-pay

## 2023-02-12 ENCOUNTER — Ambulatory Visit (INDEPENDENT_AMBULATORY_CARE_PROVIDER_SITE_OTHER): Payer: BC Managed Care – PPO | Admitting: Family Medicine

## 2023-02-12 ENCOUNTER — Encounter: Payer: Self-pay | Admitting: Family Medicine

## 2023-02-12 VITALS — BP 110/60 | HR 99 | Temp 98.8°F | Resp 18 | Ht 63.0 in | Wt 217.2 lb

## 2023-02-12 DIAGNOSIS — Z23 Encounter for immunization: Secondary | ICD-10-CM | POA: Diagnosis not present

## 2023-02-12 DIAGNOSIS — R7303 Prediabetes: Secondary | ICD-10-CM | POA: Diagnosis not present

## 2023-02-12 DIAGNOSIS — Z6839 Body mass index (BMI) 39.0-39.9, adult: Secondary | ICD-10-CM | POA: Diagnosis not present

## 2023-02-12 DIAGNOSIS — E538 Deficiency of other specified B group vitamins: Secondary | ICD-10-CM

## 2023-02-12 DIAGNOSIS — K760 Fatty (change of) liver, not elsewhere classified: Secondary | ICD-10-CM

## 2023-02-12 DIAGNOSIS — Z0000 Encounter for general adult medical examination without abnormal findings: Secondary | ICD-10-CM

## 2023-02-12 MED ORDER — WEGOVY 0.25 MG/0.5ML ~~LOC~~ SOAJ
0.2500 mg | SUBCUTANEOUS | 0 refills | Status: DC
Start: 2023-02-12 — End: 2023-05-17
  Filled 2023-02-12: qty 2, 28d supply, fill #0

## 2023-02-12 MED ORDER — CYANOCOBALAMIN 1000 MCG/ML IJ SOLN: 1000.0000 ug | INTRAMUSCULAR | 3 refills | Status: DC

## 2023-02-12 NOTE — Progress Notes (Signed)
Established Patient Office Visit  Subjective   Patient ID: Morgan Long, female    DOB: [birth year]  Age: 52 y.o. MRN: [id number]  Chief Complaint  Patient presents with   Annual Exam    Pt states fasting     HPI Discussed the use of AI scribe software for clinical note transcription with the patient, who gave verbal consent to proceed.  History of Present Illness   The patient, with a history of fatty liver, presents for a physical and blood work. She has been self-administering monthly B12 injections, previously obtained from a weight clinic. However, she stopped attending the clinic as their insurance did not cover the weight loss treatments. She is in the process of changing their insurance coverage from state to employer-based, hoping to maintain [employer].  The patient has previously used Ozempic for weight loss, losing approximately 20 pounds, and did not experience any side effects. She has not used Ozempic for about a year. She also express concern about their fatty liver and high cholesterol.  In addition to these health concerns, the patient mentions a recent burn on their neck from a heating blanket, which was initially misdiagnosed as shingles by a dermatologist. She also report occasional joint discomfort, which she attribute to their current weight and lack of regular vitamin intake.      Patient Active Problem List   Diagnosis Date Noted   Other hyperlipidemia 10/09/2022   BMI 37.0-37.9, adult Current BMI 37.7 10/09/2022   Acute cough 07/18/2022   Polyphagia 06/15/2022   Right groin mass 03/28/2022   Impacted cerumen of left ear 10/13/2021   Chronic constipation 10/04/2021   At risk for impaired metabolic function 10/04/2021   Allergic conjunctivitis of both eyes 08/25/2021   B12 deficiency 06/23/2021   Prediabetes 06/23/2021   Vitamin D deficiency 06/23/2021   Hematoma 05/25/2021   Changing skin lesion 05/13/2021   Frequent PVCs 12/30/2020    DOE (dyspnea on exertion) 12/30/2020   Allergic contact dermatitis due to cosmetics 12/10/2020   Newly recognized murmur 12/10/2020   Herpes zoster 11/12/2020   Preventative health care 11/04/2020   Shortness of breath    COVID-19 09/13/2020   Adhesive capsulitis of left shoulder 08/04/2020   Pain in joint of left shoulder 08/03/2020   Hepatic adenoma 06/08/2020   Liver lesion 05/04/2020   TMJ pain dysfunction syndrome    Tired    Sinus complaint    SOB (shortness of breath)    Pain in right ankle and joints of right foot    Overweight    Muscle weakness (generalized)    Migraine    Localized edema    Lactose intolerance    History of stomach ulcers    Hepatic steatosis    Heartburn    Fatigue    Dry skin    Dry eye syndrome of both lacrimal glands    Constipation    Chronic migraine    Asthma    History of COVID-19 01/05/2020   Anxiety 05/12/2019   Abnormality of lung on CXR 03/06/2019   Moderate persistent asthma 01/23/2019   Lower extremity edema 01/23/2019   Capsulitis of ankle, left 09/24/2018   Synovitis of left ankle 09/24/2018   Ankle injury, initial encounter 05/23/2018   Acute pain of left shoulder 03/12/2018   S/P repair of ligament of ankle 03/05/2018   Chronic pain of right ankle 10/28/2017   Other fatigue 08/14/2017   Dyspnea 08/14/2017   Other insomnia 08/14/2017  Medication management 06/12/2017   Nonintractable headache 06/12/2017   Gastroparesis 06/12/2017   Class 2 severe obesity with serious comorbidity and body mass index (BMI) of 35.0 to 35.9 in adult (HCC) 04/25/2017   Leukocytosis 03/01/2017   Generalized obesity 03/01/2017   Chronic migraine without aura without status migrainosus, not intractable 03/01/2017   Sinusitis 07/31/2016   Influenza-like illness 07/12/2016   Peroneal tendinitis of left lower extremity 11/03/2015   Pes anserine bursitis 11/03/2015   Asthma with acute exacerbation 06/15/2015   Acute sinusitis 06/15/2015   GERD  (gastroesophageal reflux disease) 01/13/2015   Myalgia and myositis 07/22/2014   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 Clarisse Gouge   Constipation    Dry eye syndrome of both lacrimal glands    Dry skin    Endometriosis    Fatigue    Gastroparesis    GERD (gastroesophageal reflux disease)    Headache(784.0)    occasional, dx w/ Migraines before, Topamax helps   Heartburn    Hepatic steatosis    History of stomach ulcers    Lactose intolerance    Localized edema    Migraine    Muscle weakness (generalized)    Nuclear cataract of both eyes    Mild   Overweight    Pain in right ankle and joints of right foot    Prediabetes    Shortness of breath    Sinus complaint    Sinusitis    Tired    TMJ pain dysfunction syndrome    occasional   Vitamin D deficiency    Past Surgical History:  Procedure Laterality Date   ANKLE SURGERY Left 11/16/2017   AUGMENTATION MAMMAPLASTY Bilateral 2006   BREAST ENHANCEMENT SURGERY  2006   BUNIONECTOMY     COLONOSCOPY     endrometroisis     fallopian tube removed     Left   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   UPPER GASTROINTESTINAL ENDOSCOPY     [facility] teeth extraction     Social History   Tobacco Use   Smoking status: Never    Passive exposure: Never   Smokeless tobacco: Never  Vaping Use   Vaping status: Never Used  Substance Use Topics   Alcohol use: Not Currently    Comment: socially - occasional    Drug use: No   Social  History   Socioeconomic History   Marital status: Single    Spouse name: Not on file   Number of children: 0   Years of education: BS   Highest education level: Master's degree (e.g., MA, MS, MEng, MEd, MSW, MBA)  Occupational History   Occupation: Sports coach, going to school    Employer: [employer]   Occupation: Wellsite geologist   Occupation: Chief Executive Officer  Tobacco Use   Smoking status: Never    Passive exposure: Never   Smokeless tobacco: Never  Vaping Use   Vaping status: Never Used  Substance and Sexual Activity   Alcohol use: Not Currently    Comment: socially - occasional    Drug use: No   Sexual activity: Yes    Birth control/protection: Inserts    Comment: nuvaring  Other Topics Concern   Not on file  Social History Narrative   Household:sister and her 3 kids    Drinks occasional starbucks drink       Social Determinants of Health   Financial Resource Strain: Patient Declined (09/29/2022)   Overall Financial Resource Strain (CARDIA)    Difficulty of Paying Living Expenses: Patient declined  Food Insecurity: Food Insecurity Present (09/29/2022)   Hunger Vital Sign    Worried About Running Out of Food in the Last Year: Patient declined    Ran Out of Food in the Last Year: Sometimes true  Transportation Needs: No Transportation Needs (09/29/2022)   PRAPARE - Administrator, Civil Service (Medical): No    Lack of Transportation (Non-Medical): No  Physical Activity: Insufficiently Active (09/29/2022)   Exercise Vital Sign    Days of Exercise per Week: 2 days    Minutes of Exercise per Session: 50 min  Stress: No Stress Concern Present (09/29/2022)   [employer] of Occupational Health - Occupational Stress Questionnaire    Feeling of Stress : Only a little  Social Connections: Moderately Integrated (09/29/2022)   Social Connection and Isolation Panel [NHANES]    Frequency of Communication with Friends and Family: More than three times a  week    Frequency of Social Gatherings with Friends and Family: Once a week    Attends Religious Services: 1 to 4 times per year    Active Member of [employer] or Organizations: Yes    Attends Banker Meetings: 1 to 4 times per year    Marital Status: Never married  Catering manager Violence: Not on file   Family Status  Relation Name Status   Mother  Deceased at age 68       throat cancer   Father  Deceased at age 22       MI   PGF  (Not Specified)   Mat Aunt  Deceased   Mat Aunt  Deceased   Mat Aunt  Deceased   Mat Aunt  Deceased   Mat Aunt  Alive   Mat Aunt  Alive   Mat Aunt  (Not Specified)   Mat Aunt  (Not Specified)   Mat Aunt  (Not Specified)   Mat Uncle  Deceased       throat   Mat Uncle  Deceased       pancreatic   Mat Uncle  (Not Specified)   Other  (Not Specified)   Other  (Not Specified)   Neg Hx  (Not Specified)  No partnership data on file   Family History  Problem Relation Age of Onset   Throat cancer Mother    Cancer - Other Mother 2       throat - died 6 months   Cancer Mother    Alcoholism Mother    Stroke Father    Heart disease Father    Cancer Father    Colon cancer Paternal Grandfather    Breast cancer Maternal Aunt        breast   Breast cancer Maternal Aunt        breast   Pancreatic cancer Maternal Aunt    Hypertension Maternal Aunt        several family members  Breast cancer Maternal Aunt        total of 5 aunts   Breast cancer Maternal Aunt    Colon cancer Maternal Uncle 44       died 24   Throat cancer Maternal Uncle    Diabetes Other        grandmother   Asthma Other        cousin, maternal   Heart attack Neg Hx    Rectal cancer Neg Hx    Stomach cancer Neg Hx    Allergies  Allergen Reactions   Acetaminophen-Codeine Itching   Hydrocodone Nausea Only   Oxycodone Itching   Tyloxapol Other (See Comments)    Other reaction(s): Unknown   Codeine Itching and Nausea Only      ROS    Objective:     BP  110/60 (BP Location: Left Arm, Patient Position: Sitting, Cuff Size: Large)   Pulse 99   Temp 98.8 F (37.1 C) (Oral)   Resp 18   Ht 5\' 3"  (1.6 m)   Wt 217 lb 3.2 oz (98.5 kg)   LMP 01/05/2023   SpO2 95%   BMI 38.48 kg/m  BP Readings from Last 3 Encounters:  02/12/23 110/60  01/30/23 120/70  01/04/23 116/72   Wt Readings from Last 3 Encounters:  02/12/23 217 lb 3.2 oz (98.5 kg)  01/30/23 220 lb 9.6 oz (100.1 kg)  01/04/23 219 lb (99.3 kg)   SpO2 Readings from Last 3 Encounters:  02/12/23 95%  01/30/23 98%  01/04/23 97%      Physical Exam Vitals and nursing note reviewed.  Constitutional:      General: She is not in acute distress.    Appearance: Normal appearance. She is well-developed.  HENT:     Head: Normocephalic and atraumatic.     Right Ear: Tympanic membrane, ear canal and external ear normal. There is no impacted cerumen.     Left Ear: Tympanic membrane, ear canal and external ear normal. There is no impacted cerumen.     Nose: Nose normal.     Mouth/Throat:     Mouth: Mucous membranes are moist.     Pharynx: Oropharynx is clear. No oropharyngeal exudate or posterior oropharyngeal erythema.  Eyes:     General: No scleral icterus.       Right eye: No discharge.        Left eye: No discharge.     Conjunctiva/sclera: Conjunctivae normal.     Pupils: Pupils are equal, round, and reactive to light.  Neck:     Thyroid: No thyromegaly or thyroid tenderness.     Vascular: No JVD.  Cardiovascular:     Rate and Rhythm: Normal rate and regular rhythm.     Heart sounds: Normal heart sounds. No murmur heard. Pulmonary:     Effort: Pulmonary effort is normal. No respiratory distress.     Breath sounds: Normal breath sounds.  Abdominal:     General: Bowel sounds are normal. There is no distension.     Palpations: Abdomen is soft. There is no mass.     Tenderness: There is no abdominal tenderness. There is no guarding or rebound.  Genitourinary:    Vagina: Normal.   Musculoskeletal:        General: Normal range of motion.     Cervical back: Normal range of motion and neck supple.     Right lower leg: No edema.     Left lower leg: No edema.  Lymphadenopathy:  Cervical: No cervical adenopathy.  Skin:    General: Skin is warm and dry.     Findings: No erythema or rash.  Neurological:     Mental Status: She is alert and oriented to person, place, and time.     Cranial Nerves: No cranial nerve deficit.     Deep Tendon Reflexes: Reflexes are normal and symmetric.  Psychiatric:        Mood and Affect: Mood normal.        Behavior: Behavior normal.        Thought Content: Thought content normal.        Judgment: Judgment normal.      No results found for any visits on 02/12/23.  Last CBC Lab Results  Component Value Date   WBC 11.3 (H) 11/16/2022   HGB 13.2 11/16/2022   HCT 40.4 11/16/2022   MCV 90.2 11/16/2022   MCH 29.5 11/16/2022   RDW 13.5 11/16/2022   PLT 331 11/16/2022   Last metabolic panel Lab Results  Component Value Date   GLUCOSE 73 11/16/2022   NA 133 (L) 11/16/2022   K 3.6 11/16/2022   CL 105 11/16/2022   CO2 21 (L) 11/16/2022   BUN 18 11/16/2022   CREATININE 0.69 11/16/2022   GFRNONAA >60 11/16/2022   CALCIUM 8.5 (L) 11/16/2022   PROT 7.6 11/16/2022   ALBUMIN 3.6 11/16/2022   LABGLOB 3.1 09/25/2022   AGRATIO 1.4 09/25/2022   BILITOT 0.3 11/16/2022   ALKPHOS 77 11/16/2022   AST 19 11/16/2022   ALT 17 11/16/2022   ANIONGAP 7 11/16/2022   Last lipids Lab Results  Component Value Date   CHOL 212 (H) 09/25/2022   HDL 74 09/25/2022   LDLCALC 124 (H) 09/25/2022   TRIG 77 09/25/2022   CHOLHDL 2.7 07/07/2021   Last hemoglobin A1c Lab Results  Component Value Date   HGBA1C 5.6 09/25/2022   Last thyroid functions Lab Results  Component Value Date   TSH 0.697 02/16/2022   T3TOTAL 167 02/02/2021   Last vitamin D Lab Results  Component Value Date   VD25OH 50.7 09/25/2022   Last vitamin B12 and  Folate Lab Results  Component Value Date   VITAMINB12 1,316 (H) 02/16/2022   FOLATE 6.0 02/02/2021      The 10-year ASCVD risk score (Arnett DK, et al., 2019) is: 3.2%    Assessment & Plan:   Problem List Items Addressed This Visit       Unprioritized   Preventative health care - Primary   Relevant Medications   cyanocobalamin (VITAMIN B12) 1000 MCG/ML injection   Other Relevant Orders   CBC with Differential/Platelet   Comprehensive metabolic panel   Lipid panel   TSH   Prediabetes   Relevant Medications   Semaglutide-Weight Management (WEGOVY) 0.25 MG/0.5ML SOAJ   Other Relevant Orders   Comprehensive metabolic panel   Hemoglobin A1c   B12 deficiency   Relevant Orders   Vitamin B12   Other Visit Diagnoses     Class 2 severe obesity with serious comorbidity and body mass index (BMI) of 39.0 to 39.9 in adult, unspecified obesity type (HCC)       Relevant Medications   cyanocobalamin (VITAMIN B12) 1000 MCG/ML injection   Semaglutide-Weight Management (WEGOVY) 0.25 MG/0.5ML SOAJ   Other Relevant Orders   CBC with Differential/Platelet   Comprehensive metabolic panel   Lipid panel   TSH   Fatty liver       Relevant Medications   Semaglutide-Weight Management (  WEGOVY) 0.25 MG/0.5ML SOAJ   Need for influenza vaccination       Relevant Orders   Flu vaccine trivalent PF, 6mos and older(Flulaval,Afluria,Fluarix,Fluzone) (Completed)     Assessment and Plan    Obesity Patient previously had success with Ozempic for weight loss. Currently not on any weight loss medication. Discussed potential coverage of Wegovy under prediabetes and fatty liver indications. -Submit prescription for [facility] with indication for prediabetes and fatty liver.  Fatty Liver Mild fatty liver noted on previous imaging. Discussed potential benefit of Wegovy for this condition. -Continue monitoring.  Hyperlipidemia Previous cholesterol levels were elevated. -Repeat lipid panel  today.  Vitamin B12 Deficiency Patient self-administers monthly B12 injections. -Continue current regimen.  General Health Maintenance -Administer influenza vaccine today. -Continue regular follow-ups with gynecologist, Dr. Milton Ferguson.        No follow-ups on file.    Donato Schultz, DO

## 2023-02-13 ENCOUNTER — Telehealth: Payer: Self-pay

## 2023-02-13 ENCOUNTER — Other Ambulatory Visit (HOSPITAL_COMMUNITY): Payer: Self-pay

## 2023-02-13 ENCOUNTER — Other Ambulatory Visit (HOSPITAL_BASED_OUTPATIENT_CLINIC_OR_DEPARTMENT_OTHER): Payer: Self-pay

## 2023-02-13 LAB — CBC WITH DIFFERENTIAL/PLATELET
Basophils Absolute: 0.1 10*3/uL (ref 0.0–0.1)
Basophils Relative: 1.3 % (ref 0.0–3.0)
Eosinophils Absolute: 0 10*3/uL (ref 0.0–0.7)
Eosinophils Relative: 0.5 % (ref 0.0–5.0)
HCT: 41.3 % (ref 36.0–46.0)
Hemoglobin: 13.2 g/dL (ref 12.0–15.0)
Lymphocytes Relative: 17 % (ref 12.0–46.0)
Lymphs Abs: 1.5 10*3/uL (ref 0.7–4.0)
MCHC: 32.1 g/dL (ref 30.0–36.0)
MCV: 91.5 fl (ref 78.0–100.0)
Monocytes Absolute: 0.5 10*3/uL (ref 0.1–1.0)
Monocytes Relative: 6.1 % (ref 3.0–12.0)
Neutro Abs: 6.8 10*3/uL (ref 1.4–7.7)
Neutrophils Relative %: 75.1 % (ref 43.0–77.0)
Platelets: 377 10*3/uL (ref 150.0–400.0)
RBC: 4.51 Mil/uL (ref 3.87–5.11)
RDW: 13.6 % (ref 11.5–15.5)
WBC: 9 10*3/uL (ref 4.0–10.5)

## 2023-02-13 LAB — COMPREHENSIVE METABOLIC PANEL
ALT: 16 U/L (ref 0–35)
AST: 15 U/L (ref 0–37)
Albumin: 3.9 g/dL (ref 3.5–5.2)
Alkaline Phosphatase: 87 U/L (ref 39–117)
BUN: 12 mg/dL (ref 6–23)
CO2: 27 meq/L (ref 19–32)
Calcium: 9.1 mg/dL (ref 8.4–10.5)
Chloride: 104 meq/L (ref 96–112)
Creatinine, Ser: 0.74 mg/dL (ref 0.40–1.20)
GFR: 91.81 mL/min (ref >60.00)
Glucose, Bld: 78 mg/dL (ref 70–99)
Potassium: 4 meq/L (ref 3.5–5.1)
Sodium: 139 meq/L (ref 135–145)
Total Bilirubin: 0.4 mg/dL (ref 0.2–1.2)
Total Protein: 7.2 g/dL (ref 6.0–8.3)

## 2023-02-13 LAB — LIPID PANEL
Cholesterol: 230 mg/dL — ABNORMAL HIGH (ref 0–200)
HDL: 73.9 mg/dL (ref >39.00)
LDL Cholesterol: 134 mg/dL — ABNORMAL HIGH (ref 0–99)
NonHDL: 155.71
Total CHOL/HDL Ratio: 3
Triglycerides: 108 mg/dL (ref 0.0–149.0)
VLDL: 21.6 mg/dL (ref 0.0–40.0)

## 2023-02-13 LAB — VITAMIN B12: Vitamin B-12: 1150 pg/mL — ABNORMAL HIGH (ref 211–911)

## 2023-02-13 LAB — TSH: TSH: 0.88 u[IU]/mL (ref 0.35–5.50)

## 2023-02-13 LAB — HEMOGLOBIN A1C: Hgb A1c MFr Bld: 5.9 % (ref 4.6–6.5)

## 2023-02-13 NOTE — Telephone Encounter (Signed)
Pharmacy Patient Advocate Encounter  Insurance verification completed.    The patient is insured through [facility]   Ran test claim for [employer]. The patients plan does (Not) cover any weight loss drug .       This test claim was processed through [facility]- copay amounts may vary at other pharmacies due to pharmacy/plan contracts, or as the patient moves through the different stages of their insurance plan.

## 2023-02-14 ENCOUNTER — Ambulatory Visit: Payer: BC Managed Care – PPO | Admitting: Neurology

## 2023-02-14 ENCOUNTER — Encounter: Payer: Self-pay | Admitting: Neurology

## 2023-02-14 ENCOUNTER — Other Ambulatory Visit (HOSPITAL_BASED_OUTPATIENT_CLINIC_OR_DEPARTMENT_OTHER): Payer: Self-pay

## 2023-02-14 NOTE — Progress Notes (Deleted)
Botox- 200 units x 1 vial Lot: [id number] Expiration: 2026/12 NDC: 0023-3921-02  Bacteriostatic 0.9% Sodium Chloride- 4mL  Lot: [id number] Expiration: 09/04/23 NDC: 3244-0102-72  Dx:  Z36.644, G43.719  B/B Witnessed by Dione Plover RN

## 2023-02-15 ENCOUNTER — Other Ambulatory Visit (HOSPITAL_BASED_OUTPATIENT_CLINIC_OR_DEPARTMENT_OTHER): Payer: Self-pay

## 2023-02-15 DIAGNOSIS — N898 Other specified noninflammatory disorders of vagina: Secondary | ICD-10-CM | POA: Diagnosis not present

## 2023-02-15 DIAGNOSIS — L720 Epidermal cyst: Secondary | ICD-10-CM | POA: Diagnosis not present

## 2023-02-15 DIAGNOSIS — N760 Acute vaginitis: Secondary | ICD-10-CM | POA: Diagnosis not present

## 2023-02-16 NOTE — Telephone Encounter (Signed)
Pt made aware

## 2023-02-19 ENCOUNTER — Other Ambulatory Visit (HOSPITAL_BASED_OUTPATIENT_CLINIC_OR_DEPARTMENT_OTHER): Payer: Self-pay

## 2023-02-20 ENCOUNTER — Ambulatory Visit: Payer: BC Managed Care – PPO | Admitting: Neurology

## 2023-02-20 ENCOUNTER — Other Ambulatory Visit (HOSPITAL_BASED_OUTPATIENT_CLINIC_OR_DEPARTMENT_OTHER): Payer: Self-pay

## 2023-02-20 ENCOUNTER — Encounter: Payer: Self-pay | Admitting: Neurology

## 2023-02-20 VITALS — BP 123/78 | Wt 215.0 lb

## 2023-02-20 DIAGNOSIS — G43709 Chronic migraine without aura, not intractable, without status migrainosus: Secondary | ICD-10-CM | POA: Diagnosis not present

## 2023-02-20 MED ORDER — ONABOTULINUMTOXINA 100 UNITS IJ SOLR
155.0000 [IU] | Freq: Once | INTRAMUSCULAR | Status: AC
Start: 2023-02-20 — End: 2023-02-20
  Administered 2023-02-20: 155 [IU] via INTRAMUSCULAR

## 2023-02-20 MED ORDER — UBRELVY 100 MG PO TABS: 100.0000 mg | ORAL_TABLET | ORAL | 11 refills | Status: DC | PRN

## 2023-02-20 NOTE — Progress Notes (Signed)
   BOTOX PROCEDURE NOTE FOR MIGRAINE HEADACHE  HISTORY: Here for Botox. Last was 11/21/22 by me. The last cycle went great, no migraine until the last few weeks, now almost every day. Takes [name] 50 mg, just tried to take 2 tablets. Takes tizanidine as needed with great benefit. I will do the procerus and corrugator muscles today.  She has not had any cosmetic Botox.  Description of procedure:  The patient was placed in a sitting position. The standard protocol was used for Botox as follows, with 5 units of Botox injected at each site:   -Procerus muscle, midline injection  -Corrugator muscle, bilateral injection  -Frontalis muscle, bilateral injection, with 2 sites each side, medial injection was performed in the upper one third of the frontalis muscle, in the region vertical from the medial inferior edge of the superior orbital rim. The lateral injection was again in the upper one third of the forehead vertically above the lateral limbus of the cornea, 1.5 cm lateral to the medial injection site.  -Temporalis muscle injection, 4 sites, bilaterally. The first injection was 3 cm above the tragus of the ear, second injection site was 1.5 cm to 3 cm up from the first injection site in line with the tragus of the ear. The third injection site was 1.5-3 cm forward between the first 2 injection sites. The fourth injection site was 1.5 cm posterior to the second injection site.  -Occipitalis muscle injection, 3 sites, bilaterally. The first injection was done one half way between the occipital protuberance and the tip of the mastoid process behind the ear. The second injection site was done lateral and superior to the first, 1 fingerbreadth from the first injection. The third injection site was 1 fingerbreadth superiorly and medially from the first injection site.  -Cervical paraspinal muscle injection, 2 sites, bilateral, the first injection site was 1 cm from the midline of the cervical spine, 3 cm  inferior to the lower border of the occipital protuberance. The second injection site was 1.5 cm superiorly and laterally to the first injection site.  -Trapezius muscle injection was performed at 3 sites, bilaterally. The first injection site was in the upper trapezius muscle halfway between the inflection point of the neck, and the acromion. The second injection site was one half way between the acromion and the first injection site. The third injection was done between the first injection site and the inflection point of the neck.   A 200 unit bottle of Botox was used, 155 units were injected, the rest of the Botox was wasted. The patient tolerated the procedure well, there were no complications of the above procedure.  Botox NDC 2595-6387-56 Lot number [id number] Expiration date 05/2025 BB  I sent in the 100 mg [name] instead of the 50.

## 2023-02-20 NOTE — Progress Notes (Signed)
Botox- 200 units x 1 vial Lot: [id number] Expiration: 05/2025 NDC: 4696-2952-84  Bacteriostatic 0.9% Sodium Chloride- * mL  Lot: [id number] Expiration: 09/04/2023 NDC: 1027-2536-64  Dx: Q03.474  B/B Witnessed by Eather Colas, RCMA

## 2023-02-23 ENCOUNTER — Other Ambulatory Visit (HOSPITAL_BASED_OUTPATIENT_CLINIC_OR_DEPARTMENT_OTHER): Payer: Self-pay

## 2023-02-27 ENCOUNTER — Encounter: Payer: Self-pay | Admitting: Neurology

## 2023-02-27 ENCOUNTER — Encounter: Payer: Self-pay | Admitting: Family Medicine

## 2023-02-28 ENCOUNTER — Other Ambulatory Visit (HOSPITAL_COMMUNITY): Payer: Self-pay

## 2023-02-28 ENCOUNTER — Ambulatory Visit: Payer: BC Managed Care – PPO | Admitting: Physician Assistant

## 2023-02-28 ENCOUNTER — Telehealth: Payer: Self-pay

## 2023-02-28 ENCOUNTER — Encounter: Payer: Self-pay | Admitting: Physician Assistant

## 2023-02-28 VITALS — BP 126/80 | HR 80 | Temp 98.3°F | Resp 16 | Ht 63.0 in | Wt 213.2 lb

## 2023-02-28 DIAGNOSIS — J029 Acute pharyngitis, unspecified: Secondary | ICD-10-CM

## 2023-02-28 LAB — POCT RAPID STREP A (OFFICE): Rapid Strep A Screen: NEGATIVE

## 2023-02-28 NOTE — Telephone Encounter (Signed)
Pharmacy Patient Advocate Encounter   Received notification from CoverMyMeds that prior authorization for [name] 50MG  tablets is required/requested.   Insurance verification completed.   The patient is insured through [facility] .   Per test claim: PA required; PA started via CoverMyMeds. KEY [id number] . Waiting for clinical questions to populate.

## 2023-02-28 NOTE — Progress Notes (Signed)
Established patient visit   Patient: Morgan Long   DOB: Jun 11, 1970   52 y.o. Female  MRN: [id number] Visit Date: 02/28/2023  Today's healthcare provider: Alfredia Ferguson, PA-C   Chief Complaint  Patient presents with   Sore Throat    One of her kids had strep throat, denies fever +cough, mild ear pain   Subjective    Sore Throat  Associated symptoms include congestion and ear pain. Pertinent negatives include no abdominal pain, coughing, headaches or shortness of breath.    Pt reports sore throat, PND, left sided ear pain. Reports strep exposure from a family member.  Medications: Outpatient Medications Prior to Visit  Medication Sig   cetirizine (ZYRTEC) 10 MG tablet Take 1 tablet (10 mg total) by mouth daily.   clindamycin (CLEOCIN T) 1 % external solution Apply topically 2 (two) times daily.   cyanocobalamin (VITAMIN B12) 1000 MCG/ML injection Inject 1 mL (1,000 mcg total) into the muscle every 30 (thirty) days.   ESTROGEL 0.75 MG/1.25 GM (0.06%) topical gel Apply 1 application topically daily.   etonogestrel-ethinyl estradiol (NUVARING) 0.12-0.015 MG/24HR vaginal ring Place 1 each vaginally every 28 (twenty-eight) days. Insert vaginally and leave in place for 3 consecutive weeks, then remove for 1 week.   linaclotide (LINZESS) 72 MCG capsule Take 1 capsule (72 mcg total) by mouth daily before breakfast.   Multiple Vitamin (MULTIVITAMIN) LIQD Take 5 mLs by mouth daily.   promethazine (PHENERGAN) 12.5 MG tablet Take 1 tablet (12.5 mg total) by mouth every 6 (six) hours as needed for nausea or vomiting.   tiZANidine (ZANAFLEX) 2 MG tablet Take 1 tablet (2 mg total) by mouth every 8 (eight) hours as needed for muscle spasms (for migraine).   Ubrogepant ([name]) 100 MG TABS Take 1 tablet (100 mg total) by mouth as needed.   Vitamin D, Ergocalciferol, (DRISDOL) 1.25 MG (50000 UNIT) CAPS capsule Take 1 capsule (50,000 Units total) by mouth every 7 (seven) days.    Semaglutide-Weight Management (WEGOVY) 0.25 MG/0.5ML SOAJ Inject 0.25 mg into the skin once a week. (Patient not taking: Reported on 02/28/2023)   triamcinolone (NASACORT ALLERGY 24HR) 55 MCG/ACT AERO nasal inhaler Place 2 sprays into the nose daily. (Patient not taking: Reported on 02/20/2023)   No facility-administered medications prior to visit.    Review of Systems  Constitutional:  Negative for fatigue and fever.  HENT:  Positive for congestion, ear pain and sore throat.   Respiratory:  Negative for cough and shortness of breath.   Cardiovascular:  Negative for chest pain and leg swelling.  Gastrointestinal:  Negative for abdominal pain.  Neurological:  Negative for dizziness and headaches.      Objective    BP 126/80   Pulse 80   Temp 98.3 F (36.8 C) (Oral)   Resp 16   Ht 5\' 3"  (1.6 m)   Wt 213 lb 4 oz (96.7 kg)   LMP 12/28/2022 (Approximate)   SpO2 97%   BMI 37.78 kg/m   Physical Exam Constitutional:      General: She is awake.     Appearance: She is well-developed.  HENT:     Head: Normocephalic.     Right Ear: Tympanic membrane normal.     Left Ear: Tympanic membrane normal.     Mouth/Throat:     Pharynx: Posterior oropharyngeal erythema present. No oropharyngeal exudate.     Tonsils: No tonsillar exudate. 0 on the right. 0 on the left.  Eyes:  Conjunctiva/sclera: Conjunctivae normal.  Cardiovascular:     Rate and Rhythm: Normal rate and regular rhythm.     Heart sounds: Normal heart sounds.  Pulmonary:     Effort: Pulmonary effort is normal.     Breath sounds: Normal breath sounds.  Skin:    General: Skin is warm.  Neurological:     Mental Status: She is alert and oriented to person, place, and time.  Psychiatric:        Attention and Perception: Attention normal.        Mood and Affect: Mood normal.        Speech: Speech normal.        Behavior: Behavior is cooperative.      Results for orders placed or performed in visit on 02/28/23  POCT  rapid strep A  Result Value Ref Range   Rapid Strep A Screen Negative Negative    Assessment & Plan     1. Acute pharyngitis, unspecified etiology Strep negative. Recommending hydration, otc antihistamines, tylenol, saline rinses  - POCT rapid strep A   Return if symptoms worsen or fail to improve.      I, Alfredia Ferguson, PA-C have reviewed all documentation for this visit. The documentation on  02/28/23   for the exam, diagnosis, procedures, and orders are all accurate and complete.    Alfredia Ferguson, PA-C  [facility] at [facility] [phone] (phone) [phone] (fax)  [facility]

## 2023-03-02 ENCOUNTER — Other Ambulatory Visit (HOSPITAL_COMMUNITY): Payer: Self-pay

## 2023-03-05 ENCOUNTER — Ambulatory Visit: Payer: BC Managed Care – PPO | Admitting: Neurology

## 2023-03-05 ENCOUNTER — Other Ambulatory Visit (HOSPITAL_BASED_OUTPATIENT_CLINIC_OR_DEPARTMENT_OTHER): Payer: Self-pay

## 2023-03-06 ENCOUNTER — Other Ambulatory Visit (HOSPITAL_BASED_OUTPATIENT_CLINIC_OR_DEPARTMENT_OTHER): Payer: Self-pay

## 2023-03-08 ENCOUNTER — Ambulatory Visit: Payer: BC Managed Care – PPO | Admitting: Neurology

## 2023-03-09 ENCOUNTER — Other Ambulatory Visit (HOSPITAL_BASED_OUTPATIENT_CLINIC_OR_DEPARTMENT_OTHER): Payer: Self-pay

## 2023-03-09 ENCOUNTER — Other Ambulatory Visit (HOSPITAL_COMMUNITY): Payer: Self-pay

## 2023-03-09 NOTE — Telephone Encounter (Signed)
Clinical questions have been submitted-awaiting determination. 

## 2023-03-12 ENCOUNTER — Other Ambulatory Visit (HOSPITAL_BASED_OUTPATIENT_CLINIC_OR_DEPARTMENT_OTHER): Payer: Self-pay

## 2023-03-12 DIAGNOSIS — M7661 Achilles tendinitis, right leg: Secondary | ICD-10-CM | POA: Diagnosis not present

## 2023-03-13 ENCOUNTER — Other Ambulatory Visit (HOSPITAL_BASED_OUTPATIENT_CLINIC_OR_DEPARTMENT_OTHER): Payer: Self-pay

## 2023-03-13 NOTE — Progress Notes (Unsigned)
Patient: Morgan Long Date of Birth: 03-30-1971  Reason for Visit: Follow up History from: Patient Primary Neurologist: Frances Furbish  ASSESSMENT AND PLAN 51 y.o. year old female   1.  Chronic migraine headache  -Continue Botox every 3 months for migraine prevention.  Has had excellent benefit.  Does not have any migraines until Botox wears off. -Continue [name] 100 mg as needed for acute migraine headache. -Will increase tizanidine to 4 mg as needed for muscle tension that may trigger migraine, may combine with [name] for severe migraine. -We discussed magnesium up to 400 mg daily may be helpful for migraine prevention. May also help with sleep and anxiety. Reviewed side effect -We will continue Botox every 3 months  Meds ordered this encounter  Medications   tiZANidine (ZANAFLEX) 4 MG tablet    Sig: Take 1 tablet (4 mg total) by mouth every 6 (six) hours as needed for muscle spasms (as needed for muscle tension).    Dispense:  20 tablet    Refill:  1   HISTORY OF PRESENT ILLNESS: Today 03/14/23 Last saw me 02/20/2023 for Botox. Under a lot of stress, she moved houses. Her sister and her kids are now living with her. Botox doing well for her. Migraines under good control. Has tizanidine as needed for muscle tension in her neck and shoulders than can trigger migraines. Has zero migraines until Botox wears off. Has mild to moderate headache every few weeks. Bernita Raisin works well when she takes it. 100 mg works good doesn't have to repeat. Is down 10 lbs, couldn't get the [facility] anymore. Migraines are right sided temple, migraine features.   HISTORY  Ms. Vonasek is a 52 year old right-handed woman with an underlying medical history of allergic rhinitis, endometriosis, TMJ problems, and neck pain, as well as obesity, who presents for evaluation of her headaches. Her headache specialist retired. I last saw her on 09/07/2015 for follow-up for her blurry vision. Her headaches were stable. She  had come off of Zonegran and was started on nortriptyline by Dr. Vela Prose.    04/25/17: She reports a long-standing history of migraine headaches, she recalls she was about 52 years old when she started having migraines. For quite some time she had spontaneous improvement in her migraines but some 4 or 5 years ago started having more frequent migraines. She has tried and failed multiple preventative medications but does not have a list with her and prior records from Dr. Glade Stanford office are not available today. She reports that his office is closed at this point. She did not get her records from his office. Perhaps her primary care provider has records. I encouraged her to try to obtain those records as it will help [name] manage her migraines optimally. She recalls having tried Topamax. She had hair loss from some medications but is not completely sure which ones. Topamax did not help, Zonegran caused her sedation, nortriptyline was also tried but she is unsure what happened with it. Amitriptyline she may have tried as well. She did not try Depakote. She is worried about sedation side effects as she also is going to school and has to be alert throughout the day. She takes occasional nausea medicine which likely is Zofran under the tongue. She has tried Maxalt under the tongue which is helpful at times. She needs a refill on that. She has regular eye exams because of her history of dry eyes. She used to wear a single contact but was advised to stop using it.  She has had 2 rounds of Botox injections, last time about 3 months ago as she recalls. She remembers doing okay with it. Prior to starting Botox injections she reports having about 18-20 headache days per month typically. She does not always have a typical aura but sometimes has visual blurriness and bringing in her ears to warn her about an upcoming migraine headache. She has occasional nausea that precedes the headache.  REVIEW OF SYSTEMS: Out of a complete 14  system review of symptoms, the patient complains only of the following symptoms, and all other reviewed systems are negative.  See HPI  ALLERGIES: Allergies  Allergen Reactions   Acetaminophen-Codeine Itching   Hydrocodone Nausea Only   Oxycodone Itching   Tyloxapol Other (See Comments)    Other reaction(s): Unknown   Codeine Itching and Nausea Only    HOME MEDICATIONS: Outpatient Medications Prior to Visit  Medication Sig Dispense Refill   cetirizine (ZYRTEC) 10 MG tablet Take 1 tablet (10 mg total) by mouth daily. 30 tablet 5   clindamycin (CLEOCIN T) 1 % external solution Apply topically 2 (two) times daily. 30 mL 5   cyanocobalamin (VITAMIN B12) 1000 MCG/ML injection Inject 1 mL (1,000 mcg total) into the muscle every 30 (thirty) days. 3 mL 3   ESTROGEL 0.75 MG/1.25 GM (0.06%) topical gel Apply 1 application topically daily.  0   etonogestrel-ethinyl estradiol (NUVARING) 0.12-0.015 MG/24HR vaginal ring Place 1 each vaginally every 28 (twenty-eight) days. Insert vaginally and leave in place for 3 consecutive weeks, then remove for 1 week.     Multiple Vitamin (MULTIVITAMIN) LIQD Take 5 mLs by mouth daily.     promethazine (PHENERGAN) 12.5 MG tablet Take 1 tablet (12.5 mg total) by mouth every 6 (six) hours as needed for nausea or vomiting. 30 tablet 0   tiZANidine (ZANAFLEX) 2 MG tablet Take 1 tablet (2 mg total) by mouth every 8 (eight) hours as needed for muscle spasms (for migraine). 20 tablet 0   Ubrogepant ([name]) 100 MG TABS Take 1 tablet (100 mg total) by mouth as needed. 12 tablet 11   Vitamin D, Ergocalciferol, (DRISDOL) 1.25 MG (50000 UNIT) CAPS capsule Take 1 capsule (50,000 Units total) by mouth every 7 (seven) days. 4 capsule 0   linaclotide (LINZESS) 72 MCG capsule Take 1 capsule (72 mcg total) by mouth daily before breakfast. (Patient not taking: Reported on 03/14/2023) 90 capsule 1   Semaglutide-Weight Management (WEGOVY) 0.25 MG/0.5ML SOAJ Inject 0.25 mg into the  skin once a week. (Patient not taking: Reported on 02/28/2023) 2 mL 0   triamcinolone (NASACORT ALLERGY 24HR) 55 MCG/ACT AERO nasal inhaler Place 2 sprays into the nose daily. (Patient not taking: Reported on 02/20/2023) 1 each 12   No facility-administered medications prior to visit.    PAST MEDICAL HISTORY: Past Medical History:  Diagnosis Date   Allergic rhinitis    Asthma    B12 deficiency    Chronic migraine    Dr. Clarisse Gouge   Constipation    Dry eye syndrome of both lacrimal glands    Dry skin    Endometriosis    Fatigue    Gastroparesis    GERD (gastroesophageal reflux disease)    Headache(784.0)    occasional, dx w/ Migraines before, Topamax helps   Heartburn    Hepatic steatosis    History of stomach ulcers    Lactose intolerance    Localized edema    Migraine    Muscle weakness (generalized)    Nuclear  cataract of both eyes    Mild   Overweight    Pain in right ankle and joints of right foot    Prediabetes    Shortness of breath    Sinus complaint    Sinusitis    Tired    TMJ pain dysfunction syndrome    occasional   Vitamin D deficiency     PAST SURGICAL HISTORY: Past Surgical History:  Procedure Laterality Date   ANKLE SURGERY Left 11/16/2017   AUGMENTATION MAMMAPLASTY Bilateral 2006   BREAST ENHANCEMENT SURGERY  2006   BUNIONECTOMY     COLONOSCOPY     endrometroisis     fallopian tube removed     Left   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   UPPER GASTROINTESTINAL ENDOSCOPY     [facility] teeth extraction      FAMILY HISTORY: Family History  Problem Relation Age of Onset   Throat cancer Mother    Cancer - Other Mother 36       throat - died 6 months   Cancer Mother    Alcoholism Mother    Stroke Father    Heart disease Father    Cancer Father    Colon cancer Paternal Grandfather    Breast cancer Maternal Aunt        breast   Breast cancer Maternal Aunt        breast   Pancreatic cancer Maternal Aunt     Hypertension Maternal Aunt        several family members   Breast cancer Maternal Aunt        total of 5 aunts   Breast cancer Maternal Aunt    Colon cancer Maternal Uncle 44       died 86   Throat cancer Maternal Uncle    Diabetes Other        grandmother   Asthma Other        cousin, maternal   Heart attack Neg Hx    Rectal cancer Neg Hx    Stomach cancer Neg Hx     SOCIAL HISTORY: Social History   Socioeconomic History   Marital status: Single    Spouse name: Not on file   Number of children: 0   Years of education: BS   Highest education level: Master's degree (e.g., MA, MS, MEng, MEd, MSW, MBA)  Occupational History   Occupation: Sports coach, going to school    Employer: [employer]   Occupation: Wellsite geologist   Occupation: Chief Executive Officer  Tobacco Use   Smoking status: Never    Passive exposure: Never   Smokeless tobacco: Never  Vaping Use   Vaping status: Never Used  Substance and Sexual Activity   Alcohol use: Not Currently    Comment: socially - occasional    Drug use: No   Sexual activity: Yes    Birth control/protection: Inserts    Comment: nuvaring  Other Topics Concern   Not on file  Social History Narrative   Household:sister and her 3 kids    Drinks occasional starbucks drink       Social Determinants of Health   Financial Resource Strain: Patient Declined (09/29/2022)   Overall Financial Resource Strain (CARDIA)    Difficulty of Paying Living Expenses: Patient declined  Food Insecurity: Food Insecurity Present (09/29/2022)   Hunger Vital Sign    Worried About Running Out of Food in the Last Year: Patient declined  Ran Out of Food in the Last Year: Sometimes true  Transportation Needs: No Transportation Needs (09/29/2022)   PRAPARE - Administrator, Civil Service (Medical): No    Lack of Transportation (Non-Medical): No  Physical Activity: Insufficiently Active (09/29/2022)   Exercise Vital Sign    Days of Exercise  per Week: 2 days    Minutes of Exercise per Session: 50 min  Stress: No Stress Concern Present (09/29/2022)   [employer] of Occupational Health - Occupational Stress Questionnaire    Feeling of Stress : Only a little  Social Connections: Moderately Integrated (09/29/2022)   Social Connection and Isolation Panel [NHANES]    Frequency of Communication with Friends and Family: More than three times a week    Frequency of Social Gatherings with Friends and Family: Once a week    Attends Religious Services: 1 to 4 times per year    Active Member of [employer] or Organizations: Yes    Attends Banker Meetings: 1 to 4 times per year    Marital Status: Never married  Intimate Partner Violence: Not on file   PHYSICAL EXAM  Vitals:   03/14/23 0935  BP: 122/73  Pulse: 76  Weight: 208 lb 3.2 oz (94.4 kg)  Height: 5\' 3"  (1.6 m)   Body mass index is 36.88 kg/m.  Generalized: Well developed, in no acute distress  Neurological examination  Mentation: Alert oriented to time, place, history taking. Follows all commands speech and language fluent Cranial nerve II-XII: Pupils were equal round reactive to light. Extraocular movements were full, visual field were full on confrontational test. Facial sensation and strength were normal. Head turning and shoulder shrug  were normal and symmetric. Motor: The motor testing reveals 5 over 5 strength of all 4 extremities. Good symmetric motor tone is noted throughout.  Sensory: Sensory testing is intact to soft touch on all 4 extremities. No evidence of extinction is noted.  Coordination: Cerebellar testing reveals good finger-nose-finger and heel-to-shin bilaterally.  Gait and station: Gait is normal.  Reflexes: Deep tendon reflexes are symmetric and normal bilaterally.   DIAGNOSTIC DATA (LABS, IMAGING, TESTING) - I reviewed patient records, labs, notes, testing and imaging myself where available.  Lab Results  Component Value Date   WBC  9.0 02/12/2023   HGB 13.2 02/12/2023   HCT 41.3 02/12/2023   MCV 91.5 02/12/2023   PLT 377.0 02/12/2023      Component Value Date/Time   NA 139 02/12/2023 1441   NA 139 09/25/2022 1107   K 4.0 02/12/2023 1441   CL 104 02/12/2023 1441   CO2 27 02/12/2023 1441   GLUCOSE 78 02/12/2023 1441   BUN 12 02/12/2023 1441   BUN 14 09/25/2022 1107   CREATININE 0.74 02/12/2023 1441   CALCIUM 9.1 02/12/2023 1441   PROT 7.2 02/12/2023 1441   PROT 7.4 09/25/2022 1107   ALBUMIN 3.9 02/12/2023 1441   ALBUMIN 4.3 09/25/2022 1107   AST 15 02/12/2023 1441   ALT 16 02/12/2023 1441   ALKPHOS 87 02/12/2023 1441   BILITOT 0.4 02/12/2023 1441   BILITOT 0.3 09/25/2022 1107   GFRNONAA >60 11/16/2022 1859   GFRAA >60 12/15/2019 0733   Lab Results  Component Value Date   CHOL 230 (H) 02/12/2023   HDL 73.90 02/12/2023   LDLCALC 134 (H) 02/12/2023   TRIG 108.0 02/12/2023   CHOLHDL 3 02/12/2023   Lab Results  Component Value Date   HGBA1C 5.9 02/12/2023   Lab Results  Component Value Date   VITAMINB12 1,150 (H) 02/12/2023   Lab Results  Component Value Date   TSH 0.88 02/12/2023   Margie Ege, AGNP-C, DNP 03/14/2023, 9:50 AM [facility] [address] [place], [place] [address] [phone]

## 2023-03-14 ENCOUNTER — Ambulatory Visit (INDEPENDENT_AMBULATORY_CARE_PROVIDER_SITE_OTHER): Payer: BC Managed Care – PPO | Admitting: Neurology

## 2023-03-14 VITALS — BP 122/73 | HR 76 | Ht 63.0 in | Wt 208.2 lb

## 2023-03-14 DIAGNOSIS — G43709 Chronic migraine without aura, not intractable, without status migrainosus: Secondary | ICD-10-CM | POA: Diagnosis not present

## 2023-03-14 MED ORDER — TIZANIDINE HCL 4 MG PO TABS: 4.0000 mg | ORAL_TABLET | Freq: Four times a day (QID) | ORAL | 1 refills | Status: DC | PRN

## 2023-03-14 NOTE — Patient Instructions (Signed)
Great to see you today.  We will continue Botox every 3 months.  Continue [name] 100 mg as needed for migraine headache.  We will try a little higher dose of tizanidine 4 mg as needed for muscle tension.  He may consider magnesium up to 400 mg daily for headache prevention.  May also help with anxiety and sleep.  You can purchase this over-the-counter.  Can cause GI upset.  Will see you in 3 months for Botox.  Thanks!!

## 2023-03-15 ENCOUNTER — Other Ambulatory Visit (HOSPITAL_COMMUNITY): Payer: Self-pay

## 2023-03-15 ENCOUNTER — Other Ambulatory Visit (HOSPITAL_BASED_OUTPATIENT_CLINIC_OR_DEPARTMENT_OTHER): Payer: Self-pay

## 2023-03-15 NOTE — Telephone Encounter (Signed)
Pharmacy Patient Advocate Encounter  Received notification from [facility] that Prior Authorization for [name] 50MG  tablets has been APPROVED from 03/12/2023 to 03/08/2024   PA #/Case ID/Reference #: PA Case ID #: [id number]

## 2023-03-16 ENCOUNTER — Other Ambulatory Visit (HOSPITAL_BASED_OUTPATIENT_CLINIC_OR_DEPARTMENT_OTHER): Payer: Self-pay

## 2023-03-20 DIAGNOSIS — M7661 Achilles tendinitis, right leg: Secondary | ICD-10-CM | POA: Diagnosis not present

## 2023-03-21 ENCOUNTER — Other Ambulatory Visit (HOSPITAL_BASED_OUTPATIENT_CLINIC_OR_DEPARTMENT_OTHER): Payer: Self-pay

## 2023-03-23 ENCOUNTER — Other Ambulatory Visit (HOSPITAL_BASED_OUTPATIENT_CLINIC_OR_DEPARTMENT_OTHER): Payer: Self-pay

## 2023-03-26 ENCOUNTER — Other Ambulatory Visit (HOSPITAL_BASED_OUTPATIENT_CLINIC_OR_DEPARTMENT_OTHER): Payer: Self-pay

## 2023-03-28 ENCOUNTER — Other Ambulatory Visit (HOSPITAL_BASED_OUTPATIENT_CLINIC_OR_DEPARTMENT_OTHER): Payer: Self-pay

## 2023-03-29 ENCOUNTER — Other Ambulatory Visit (HOSPITAL_BASED_OUTPATIENT_CLINIC_OR_DEPARTMENT_OTHER): Payer: Self-pay

## 2023-03-30 ENCOUNTER — Other Ambulatory Visit (HOSPITAL_BASED_OUTPATIENT_CLINIC_OR_DEPARTMENT_OTHER): Payer: Self-pay

## 2023-03-31 ENCOUNTER — Encounter (HOSPITAL_BASED_OUTPATIENT_CLINIC_OR_DEPARTMENT_OTHER): Payer: Self-pay

## 2023-03-31 ENCOUNTER — Other Ambulatory Visit: Payer: Self-pay

## 2023-03-31 ENCOUNTER — Emergency Department (HOSPITAL_BASED_OUTPATIENT_CLINIC_OR_DEPARTMENT_OTHER)
Admission: EM | Admit: 2023-03-31 | Discharge: 2023-03-31 | Disposition: A | Discharge status: Home / Self Care | Payer: BC Managed Care – PPO | Attending: Emergency Medicine | Admitting: Emergency Medicine

## 2023-03-31 DIAGNOSIS — R519 Headache, unspecified: Secondary | ICD-10-CM | POA: Insufficient documentation

## 2023-03-31 MED ORDER — PROCHLORPERAZINE EDISYLATE 10 MG/2ML IJ SOLN
5.0000 mg | Freq: Once | INTRAMUSCULAR | Status: AC
  Administered 2023-03-31: 5 mg via INTRAMUSCULAR
  Filled 2023-03-31: qty 2

## 2023-03-31 MED ORDER — DIPHENHYDRAMINE HCL 50 MG/ML IJ SOLN
12.5000 mg | Freq: Once | INTRAMUSCULAR | Status: AC
  Administered 2023-03-31: 12.5 mg via INTRAMUSCULAR
  Filled 2023-03-31: qty 1

## 2023-03-31 MED ORDER — PROCHLORPERAZINE EDISYLATE 10 MG/2ML IJ SOLN
10.0000 mg | Freq: Once | INTRAMUSCULAR | Status: AC
  Administered 2023-03-31: 10 mg via INTRAMUSCULAR
  Filled 2023-03-31: qty 2

## 2023-03-31 MED ORDER — DIPHENHYDRAMINE HCL 50 MG/ML IJ SOLN
25.0000 mg | Freq: Once | INTRAMUSCULAR | Status: AC
  Administered 2023-03-31: 25 mg via INTRAMUSCULAR
  Filled 2023-03-31: qty 1

## 2023-03-31 MED ORDER — DEXAMETHASONE 4 MG PO TABS
10.0000 mg | ORAL_TABLET | Freq: Once | ORAL | Status: AC
  Administered 2023-03-31: 10 mg via ORAL
  Filled 2023-03-31: qty 3

## 2023-03-31 NOTE — ED Triage Notes (Addendum)
Pt presents with complaints of headache X2 days. +nausea and photosensitivity. Reports hx of migraine.

## 2023-03-31 NOTE — ED Provider Notes (Signed)
[name] Provider Note   CSN: [id number] Arrival date & time: 03/31/23  3295     History  Chief Complaint  Patient presents with   Headache    Morgan Long is a 52 y.o. female.  52 yo F with a chief complaints of headache.  She gets headaches regularly.  This feels like her normal headache but is much more severe than it typically is.  She also feels like it has been lasting longer than normal.  She had called her doctor who told her that they could do a headache cocktail in the office but were not open today and encouraged her to come to the ED for evaluation.  She did strike her head a couple weeks ago.  Preceded the headache but quite a bit.  Sounds like she fell backwards while trying to hang something up.  She denies fevers denies one-sided numbness or weakness denies difficulty speech or swallowing.   Headache      Home Medications Prior to Admission medications   Medication Sig Start Date End Date Taking? Authorizing Provider  cetirizine (ZYRTEC) 10 MG tablet Take 1 tablet (10 mg total) by mouth daily. 01/30/23   Donato Schultz, DO  clindamycin (CLEOCIN T) 1 % external solution Apply topically 2 (two) times daily. 05/03/21   Donato Schultz, DO  cyanocobalamin (VITAMIN B12) 1000 MCG/ML injection Inject 1 mL (1,000 mcg total) into the muscle every 30 (thirty) days. 02/12/23   Seabron Spates R, DO  ESTROGEL 0.75 MG/1.25 GM (0.06%) topical gel Apply 1 application topically daily. 05/18/15   [provider]  etonogestrel-ethinyl estradiol (NUVARING) 0.12-0.015 MG/24HR vaginal ring Place 1 each vaginally every 28 (twenty-eight) days. Insert vaginally and leave in place for 3 consecutive weeks, then remove for 1 week.    [provider]  linaclotide [name]) 72 MCG capsule Take 1 capsule (72 mcg total) by mouth daily before breakfast. Patient not taking: Reported on 03/14/2023 01/22/23   Donato Schultz, DO  Multiple Vitamin (MULTIVITAMIN) LIQD Take 5 mLs by mouth daily.    [provider]  promethazine (PHENERGAN) 12.5 MG tablet Take 1 tablet (12.5 mg total) by mouth every 6 (six) hours as needed for nausea or vomiting. 12/15/22   Zola Button, Grayling Congress, DO  Semaglutide-Weight Management (WEGOVY) 0.25 MG/0.5ML SOAJ Inject 0.25 mg into the skin once a week. Patient not taking: Reported on 02/28/2023 02/12/23   Zola Button, Grayling Congress, DO  tiZANidine (ZANAFLEX) 4 MG tablet Take 1 tablet (4 mg total) by mouth every 6 (six) hours as needed for muscle spasms (as needed for muscle tension). 03/14/23   Glean Salvo, NP  triamcinolone (NASACORT ALLERGY 24HR) 55 MCG/ACT AERO nasal inhaler Place 2 sprays into the nose daily. Patient not taking: Reported on 02/20/2023 01/30/23   Zola Button, Grayling Congress, DO  Ubrogepant ([name]) 100 MG TABS Take 1 tablet (100 mg total) by mouth as needed. 02/20/23   Glean Salvo, NP  Vitamin D, Ergocalciferol, (DRISDOL) 1.25 MG (50000 UNIT) CAPS capsule Take 1 capsule (50,000 Units total) by mouth every 7 (seven) days. 10/09/22   Rayburn, Fanny Bien, PA-C      Allergies    Acetaminophen-codeine, Hydrocodone, Oxycodone, Tyloxapol, and Codeine    Review of Systems   Review of Systems  Neurological:  Positive for headaches.    Physical Exam Updated Vital Signs BP 120/75 (BP Location: Right Arm)  Pulse 88   Temp 98.7 F (37.1 C) (Oral)   Resp 18   Ht 5\' 3"  (1.6 m)   Wt 94.8 kg   SpO2 96%   BMI 37.02 kg/m  Physical Exam Vitals and nursing note reviewed.  Constitutional:      General: She is not in acute distress.    Appearance: She is well-developed. She is not diaphoretic.  HENT:     Head: Normocephalic and atraumatic.  Eyes:     Pupils: Pupils are equal, round, and reactive to light.  Cardiovascular:     Rate and Rhythm: Normal rate and regular rhythm.     Heart sounds: No murmur heard.    No friction rub. No gallop.  Pulmonary:      Effort: Pulmonary effort is normal.     Breath sounds: No wheezing or rales.  Abdominal:     General: There is no distension.     Palpations: Abdomen is soft.     Tenderness: There is no abdominal tenderness.  Musculoskeletal:        General: No tenderness.     Cervical back: Normal range of motion and neck supple.  Skin:    General: Skin is warm and dry.  Neurological:     Mental Status: She is alert and oriented to person, place, and time.     GCS: GCS eye subscore is 4. GCS verbal subscore is 5. GCS motor subscore is 6.     Cranial Nerves: Cranial nerves 2-12 are intact.     Sensory: Sensation is intact.     Motor: Motor function is intact.     Coordination: Coordination is intact.     Comments: With the exception of large fake eyelashes obscuring her upper visual fields benign neurologic exam  Psychiatric:        Behavior: Behavior normal.     ED Results / Procedures / Treatments   Labs (all labs ordered are listed, but only abnormal results are displayed) Labs Reviewed - No data to display  EKG None  Radiology No results found.  Procedures Procedures    Medications Ordered in ED Medications  prochlorperazine (COMPAZINE) injection 10 mg (10 mg Intramuscular Given 03/31/23 1027)  diphenhydrAMINE (BENADRYL) injection 25 mg (25 mg Intramuscular Given 03/31/23 1021)  dexamethasone (DECADRON) tablet 10 mg (10 mg Oral Given 03/31/23 1022)  prochlorperazine (COMPAZINE) injection 5 mg (5 mg Intramuscular Given 03/31/23 1125)  diphenhydrAMINE (BENADRYL) injection 12.5 mg (12.5 mg Intramuscular Given 03/31/23 1122)    ED Course/ Medical Decision Making/ A&P                                 Medical Decision Making Risk Prescription drug management.   52 yo F with a chief complaints of a headache.  Patient has a history of migraines and has been seen in the ED for this previously.  She thinks this feels similar but is a bit more severe than it has been.  She has a  benign neurologic exam.  Will treat with a headache cocktail.  Reassess.  Patient tells me she is sleepy but does not feel that much better.  Offered repeat cocktail, she would like to have a half dose of what she had before.  Will reassess.  Patient was concerned about her head injury from a couple weeks ago and wondered if she could get CT imaging.  Discussed limitations of CT.  She is  [place] head CT rules negative.  Patient feeling better.  Would like to go home at this time.  Neuro follow-up.  11:54 AM:  I have discussed the diagnosis/risks/treatment options with the patient.  Evaluation and diagnostic testing in the emergency department does not suggest an emergent condition requiring admission or immediate intervention beyond what has been performed at this time.  They will follow up with PCP, neuro. We also discussed returning to the ED immediately if new or worsening sx occur. We discussed the sx which are most concerning (e.g., sudden worsening pain, fever, inability to tolerate by mouth) that necessitate immediate return. Medications administered to the patient during their visit and any new prescriptions provided to the patient are listed below.  Medications given during this visit Medications  prochlorperazine (COMPAZINE) injection 10 mg (10 mg Intramuscular Given 03/31/23 1027)  diphenhydrAMINE (BENADRYL) injection 25 mg (25 mg Intramuscular Given 03/31/23 1021)  dexamethasone (DECADRON) tablet 10 mg (10 mg Oral Given 03/31/23 1022)  prochlorperazine (COMPAZINE) injection 5 mg (5 mg Intramuscular Given 03/31/23 1125)  diphenhydrAMINE (BENADRYL) injection 12.5 mg (12.5 mg Intramuscular Given 03/31/23 1122)     The patient appears reasonably screen and/or stabilized for discharge and I doubt any other medical condition or other [facility] requiring further screening, evaluation, or treatment in the ED at this time prior to discharge.          Final Clinical Impression(s) / ED  Diagnoses Final diagnoses:  Bad headache    Rx / DC Orders ED Discharge Orders     None         Melene Plan, DO 03/31/23 1154

## 2023-03-31 NOTE — Discharge Instructions (Signed)
Follow-up with your family doctor in the office.  Please return for sudden worsening headache uncontrolled vomiting fever or neck stiffness.

## 2023-04-02 ENCOUNTER — Encounter: Payer: Self-pay | Admitting: Neurology

## 2023-04-02 ENCOUNTER — Other Ambulatory Visit (HOSPITAL_BASED_OUTPATIENT_CLINIC_OR_DEPARTMENT_OTHER): Payer: Self-pay

## 2023-04-03 ENCOUNTER — Other Ambulatory Visit (HOSPITAL_BASED_OUTPATIENT_CLINIC_OR_DEPARTMENT_OTHER): Payer: Self-pay

## 2023-04-05 ENCOUNTER — Other Ambulatory Visit (HOSPITAL_BASED_OUTPATIENT_CLINIC_OR_DEPARTMENT_OTHER): Payer: Self-pay

## 2023-04-06 ENCOUNTER — Ambulatory Visit (INDEPENDENT_AMBULATORY_CARE_PROVIDER_SITE_OTHER): Payer: BC Managed Care – PPO | Admitting: Medical

## 2023-04-06 ENCOUNTER — Ambulatory Visit: Payer: BC Managed Care – PPO | Admitting: Family Medicine

## 2023-04-06 VITALS — BP 118/66 | HR 95 | Temp 98.3°F | Resp 18 | Ht 63.0 in | Wt 205.0 lb

## 2023-04-06 DIAGNOSIS — Z20828 Contact with and (suspected) exposure to other viral communicable diseases: Secondary | ICD-10-CM

## 2023-04-06 DIAGNOSIS — J029 Acute pharyngitis, unspecified: Secondary | ICD-10-CM | POA: Diagnosis not present

## 2023-04-06 DIAGNOSIS — K0889 Other specified disorders of teeth and supporting structures: Secondary | ICD-10-CM | POA: Diagnosis not present

## 2023-04-06 DIAGNOSIS — G43001 Migraine without aura, not intractable, with status migrainosus: Secondary | ICD-10-CM

## 2023-04-06 DIAGNOSIS — G4452 New daily persistent headache (NDPH): Secondary | ICD-10-CM

## 2023-04-06 LAB — POC COVID19 BINAXNOW: SARS Coronavirus 2 Ag: NEGATIVE

## 2023-04-06 LAB — POCT INFLUENZA A/B
Influenza A, POC: NEGATIVE
Influenza B, POC: NEGATIVE

## 2023-04-06 LAB — POCT RAPID STREP A (OFFICE): Rapid Strep A Screen: NEGATIVE

## 2023-04-06 MED ORDER — AMOXICILLIN-POT CLAVULANATE 875-125 MG PO TABS
1.0000 | ORAL_TABLET | Freq: Two times a day (BID) | ORAL | 0 refills | Status: DC
Start: 2023-04-06 — End: 2023-05-17

## 2023-04-06 MED ORDER — FLUCONAZOLE 150 MG PO TABS: ORAL_TABLET | ORAL | 0 refills | Status: DC

## 2023-04-06 MED ORDER — KETOROLAC TROMETHAMINE 60 MG/2ML IM SOLN
60.0000 mg | Freq: Once | INTRAMUSCULAR | Status: AC
  Administered 2023-04-06: 60 mg via INTRAMUSCULAR

## 2023-04-06 NOTE — Progress Notes (Addendum)
Subjective:    Patient ID: Morgan Long, female    DOB: 05-Nov-1970, 52 y.o.   MRN: [id number]  HPI    Pt has some frontal sinus  pressure/headache for 2 weeks, st(2 days) and states she is in process of getting root canal for rt upper tooth that is sensitive over a month. Pt state beginning of October she saw oral surgeon. But tooth canal is scheduled for May 11, 2023. Pt niece and nephew had pneumonia.   Pt also had exposure to flu.    ED visit hpi.   "52 yo F with a chief complaints of headache. She gets headaches regularly. This feels like her normal headache but is much more severe than it typically is. She also feels like it has been lasting longer than normal. She had called her doctor who told her that they could do a headache cocktail in the office but were not open today and encouraged her to come to the ED for evaluation. She did strike her head a couple weeks ago. Preceded the headache but quite a bit. Sounds like she fell backwards while trying to hang something up. She denies fevers denies one-sided numbness or weakness denies difficulty speech or swallowing."  Pt in for recent ha in ED.  -mild low level ha now. Lower level compared to when she was in the emergency department. Some light sensitivity. Hx of migraines.  -pt is on ubrelvy.  -she request Toradol injection. -no motor or sensory deficits. -Reviewed ED note today  Review of Systems  Constitutional:  Negative for chills and fever.  HENT:  Positive for sinus pressure, sinus pain and sore throat.        Tooth pain  Cardiovascular:  Negative for chest pain.  Gastrointestinal:  Negative for abdominal pain.  Musculoskeletal:  Negative for back pain.  Neurological:  Positive for headaches. Negative for dizziness and weakness.       See hpi.  Hematological:  Positive for adenopathy.  Psychiatric/Behavioral:  Negative for behavioral problems and confusion.     Past Medical History:  Diagnosis Date   Allergic  rhinitis    Asthma    B12 deficiency    Chronic migraine    Dr. Clarisse Gouge   Constipation    Dry eye syndrome of both lacrimal glands    Dry skin    Endometriosis    Fatigue    Gastroparesis    GERD (gastroesophageal reflux disease)    Headache(784.0)    occasional, dx w/ Migraines before, Topamax helps   Heartburn    Hepatic steatosis    History of stomach ulcers    Lactose intolerance    Localized edema    Migraine    Muscle weakness (generalized)    Nuclear cataract of both eyes    Mild   Overweight    Pain in right ankle and joints of right foot    Prediabetes    Shortness of breath    Sinus complaint    Sinusitis    Tired    TMJ pain dysfunction syndrome    occasional   Vitamin D deficiency      Social History   Socioeconomic History   Marital status: Single    Spouse name: Not on file   Number of children: 0   Years of education: BS   Highest education level: Master's degree (e.g., MA, MS, MEng, MEd, MSW, MBA)  Occupational History   Occupation: Sports coach, going to school  Employer: [employer]   Occupation: Wellsite geologist   Occupation: Chief Executive Officer  Tobacco Use   Smoking status: Never    Passive exposure: Never   Smokeless tobacco: Never  Vaping Use   Vaping status: Never Used  Substance and Sexual Activity   Alcohol use: Not Currently    Comment: socially - occasional    Drug use: No   Sexual activity: Yes    Birth control/protection: Inserts    Comment: nuvaring  Other Topics Concern   Not on file  Social History Narrative   Household:sister and her 3 kids    Drinks occasional starbucks drink       Social Determinants of Health   Financial Resource Strain: Patient Declined (09/29/2022)   Overall Financial Resource Strain (CARDIA)    Difficulty of Paying Living Expenses: Patient declined  Food Insecurity: Food Insecurity Present (09/29/2022)   Hunger Vital Sign    Worried About Running Out of Food in the Last Year: Patient  declined    Ran Out of Food in the Last Year: Sometimes true  Transportation Needs: No Transportation Needs (09/29/2022)   PRAPARE - Administrator, Civil Service (Medical): No    Lack of Transportation (Non-Medical): No  Physical Activity: Insufficiently Active (09/29/2022)   Exercise Vital Sign    Days of Exercise per Week: 2 days    Minutes of Exercise per Session: 50 min  Stress: No Stress Concern Present (09/29/2022)   [employer] of Occupational Health - Occupational Stress Questionnaire    Feeling of Stress : Only a little  Social Connections: Moderately Integrated (09/29/2022)   Social Connection and Isolation Panel [NHANES]    Frequency of Communication with Friends and Family: More than three times a week    Frequency of Social Gatherings with Friends and Family: Once a week    Attends Religious Services: 1 to 4 times per year    Active Member of [employer] or Organizations: Yes    Attends Banker Meetings: 1 to 4 times per year    Marital Status: Never married  Intimate Partner Violence: Not on file    Past Surgical History:  Procedure Laterality Date   ANKLE SURGERY Left 11/16/2017   AUGMENTATION MAMMAPLASTY Bilateral 2006   BREAST ENHANCEMENT SURGERY  2006   BUNIONECTOMY     COLONOSCOPY     endrometroisis     fallopian tube removed     Left   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   UPPER GASTROINTESTINAL ENDOSCOPY     [place] teeth extraction      Family History  Problem Relation Age of Onset   Throat cancer Mother    Cancer - Other Mother 86       throat - died 6 months   Cancer Mother    Alcoholism Mother    Stroke Father    Heart disease Father    Cancer Father    Colon cancer Paternal Grandfather    Breast cancer Maternal Aunt        breast   Breast cancer Maternal Aunt        breast   Pancreatic cancer Maternal Aunt    Hypertension Maternal Aunt        several family members   Breast  cancer Maternal Aunt        total of 5 aunts   Breast cancer Maternal Aunt    Colon cancer Maternal Uncle 80  died 28   Throat cancer Maternal Uncle    Diabetes Other        grandmother   Asthma Other        cousin, maternal   Heart attack Neg Hx    Rectal cancer Neg Hx    Stomach cancer Neg Hx     Allergies  Allergen Reactions   Acetaminophen-Codeine Itching   Hydrocodone Nausea Only   Oxycodone Itching   Tyloxapol Other (See Comments)    Other reaction(s): Unknown   Codeine Itching and Nausea Only    Current Outpatient Medications on File Prior to Visit  Medication Sig Dispense Refill   cetirizine (ZYRTEC) 10 MG tablet Take 1 tablet (10 mg total) by mouth daily. 30 tablet 5   clindamycin (CLEOCIN T) 1 % external solution Apply topically 2 (two) times daily. 30 mL 5   cyanocobalamin (VITAMIN B12) 1000 MCG/ML injection Inject 1 mL (1,000 mcg total) into the muscle every 30 (thirty) days. 3 mL 3   ESTROGEL 0.75 MG/1.25 GM (0.06%) topical gel Apply 1 application topically daily.  0   etonogestrel-ethinyl estradiol (NUVARING) 0.12-0.015 MG/24HR vaginal ring Place 1 each vaginally every 28 (twenty-eight) days. Insert vaginally and leave in place for 3 consecutive weeks, then remove for 1 week.     linaclotide (LINZESS) 72 MCG capsule Take 1 capsule (72 mcg total) by mouth daily before breakfast. (Patient not taking: Reported on 03/14/2023) 90 capsule 1   Multiple Vitamin (MULTIVITAMIN) LIQD Take 5 mLs by mouth daily.     promethazine (PHENERGAN) 12.5 MG tablet Take 1 tablet (12.5 mg total) by mouth every 6 (six) hours as needed for nausea or vomiting. 30 tablet 0   Semaglutide-Weight Management (WEGOVY) 0.25 MG/0.5ML SOAJ Inject 0.25 mg into the skin once a week. (Patient not taking: Reported on 02/28/2023) 2 mL 0   tiZANidine (ZANAFLEX) 4 MG tablet Take 1 tablet (4 mg total) by mouth every 6 (six) hours as needed for muscle spasms (as needed for muscle tension). 20 tablet 1    triamcinolone (NASACORT ALLERGY 24HR) 55 MCG/ACT AERO nasal inhaler Place 2 sprays into the nose daily. (Patient not taking: Reported on 02/20/2023) 1 each 12   Ubrogepant ([name]) 100 MG TABS Take 1 tablet (100 mg total) by mouth as needed. 12 tablet 11   Vitamin D, Ergocalciferol, (DRISDOL) 1.25 MG (50000 UNIT) CAPS capsule Take 1 capsule (50,000 Units total) by mouth every 7 (seven) days. 4 capsule 0   No current facility-administered medications on file prior to visit.    BP 118/66   Pulse 95   Temp 98.3 F (36.8 C)   Resp 18   Ht 5\' 3"  (1.6 m)   Wt 205 lb (93 kg)   SpO2 99%   BMI 36.31 kg/m        Objective:   Physical Exam General Mental Status- Alert. General Appearance- Not in acute distress.   Skin General: Color- Normal Color. Moisture- Normal Moisture.  Neck Carotid Arteries- Normal color. Moisture- Normal Moisture. No carotid bruits. No JVD.  Chest and Lung Exam Auscultation: Breath Sounds:-Normal.  Cardiovascular Auscultation:Rythm- Regular. Murmurs & Other Heart Sounds:Auscultation of the heart reveals- No Murmurs.  Abdomen Inspection:-Inspeection Normal. Palpation/Percussion:Note:No mass. Palpation and Percussion of the abdomen reveal- Non Tender, Non Distended + BS, no rebound or guarding.   Neurologic Cranial Nerve exam:- CN III-XII intact(No nystagmus), symmetric smile. Drift Test:- No drift. Finger to Nose:- Normal/Intact Strength:- 5/5 equal and symmetric strength both upper and lower extremities.  Heent- mid frontal sinus pressure to palpation. Left upper tooth tenderness to palpation. Mild pharynx redness    Assessment & Plan:   Patient Instructions  Sore throat, tooth pain and sinus pressure frontal(with exposure to flu) -augmentin 875 mg twice daily for 10 days. Below test all negative - POC COVID-19 - POCT rapid strep A - POCT Influenza A/B -flonase rx    Migraine without aura and with status migrainosus, not intractable   -continue meds rx by neurologist. -Toradol 60 mg I'm - discussed emergency department visit and presently and described injury more than 2 weeks ago. -normal neurologic exam -give me update on headache by Monday. If still lingering consider imaging study. -if worse ha or new signs or symptoms then recommend ED evaluation.  Follow up 10 days or sooner if needed   Esperanza Richters, PA-C   04-13-2023 Since last visit pt sent my chart message stating ha still present and waking up with headache.

## 2023-04-06 NOTE — Patient Instructions (Addendum)
Sore throat, tooth pain and sinus pressure frontal(with exposure to flu) -augmentin 875 mg twice daily for 10 days. Below test all negative - POC COVID-19 - POCT rapid strep A - POCT Influenza A/B -flonase rx    Migraine without aura and with status migrainosus, not intractable  -continue meds rx by neurologist. -Toradol 60 mg IM - discussed emergency department visit and presently and described injury more than 2 weeks ago. -normal neurologic exam -give me update on headache by Monday. If still lingering consider imaging study. -if worse ha or new signs or symptoms then recommend ED evaluation.  Follow up 10 days or sooner if needed

## 2023-04-06 NOTE — Addendum Note (Signed)
Addended by: Maximino Sarin on: 04/06/2023 11:26 AM   Modules accepted: Orders

## 2023-04-10 ENCOUNTER — Other Ambulatory Visit (HOSPITAL_BASED_OUTPATIENT_CLINIC_OR_DEPARTMENT_OTHER): Payer: Self-pay

## 2023-04-11 ENCOUNTER — Encounter: Payer: Self-pay | Admitting: Medical

## 2023-04-13 NOTE — Telephone Encounter (Signed)
Will you please look at pt ct head order. Will you try to get prior auth this morning and coordinate scheduling/updating pt on prior auth. Thanks.

## 2023-04-13 NOTE — Addendum Note (Signed)
Addended by: Gwenevere Abbot on: 04/13/2023 09:50 AM   Modules accepted: Orders

## 2023-04-13 NOTE — Addendum Note (Signed)
Addended by: Gwenevere Abbot on: 04/13/2023 07:06 AM   Modules accepted: Orders

## 2023-04-16 ENCOUNTER — Other Ambulatory Visit (HOSPITAL_BASED_OUTPATIENT_CLINIC_OR_DEPARTMENT_OTHER): Payer: Self-pay

## 2023-04-16 ENCOUNTER — Other Ambulatory Visit (HOSPITAL_COMMUNITY): Payer: Self-pay

## 2023-04-18 ENCOUNTER — Other Ambulatory Visit (HOSPITAL_BASED_OUTPATIENT_CLINIC_OR_DEPARTMENT_OTHER): Payer: Self-pay

## 2023-04-19 ENCOUNTER — Ambulatory Visit
Admission: RE | Admit: 2023-04-19 | Discharge: 2023-04-19 | Disposition: A | Discharge status: Home / Self Care | Payer: BC Managed Care – PPO | Source: Ambulatory Visit | Attending: Medical | Admitting: Medical

## 2023-04-19 ENCOUNTER — Telehealth: Payer: Self-pay | Admitting: Pharmacy Technician

## 2023-04-19 ENCOUNTER — Other Ambulatory Visit (HOSPITAL_COMMUNITY): Payer: Self-pay

## 2023-04-19 DIAGNOSIS — G4452 New daily persistent headache (NDPH): Secondary | ICD-10-CM | POA: Diagnosis not present

## 2023-04-19 DIAGNOSIS — G43001 Migraine without aura, not intractable, with status migrainosus: Secondary | ICD-10-CM | POA: Diagnosis not present

## 2023-04-19 NOTE — Telephone Encounter (Signed)
Pharmacy Patient Advocate Encounter   Received notification from CoverMyMeds that prior authorization for linzess is required/requested.   Insurance verification completed.   The patient is insured through [facility] .   Per test claim: PA required; PA submitted to above mentioned insurance via CoverMyMeds Key/confirmation #/EOC [id number] Status is pending

## 2023-04-19 NOTE — Telephone Encounter (Signed)
Pharmacy Patient Advocate Encounter   Received notification from CoverMyMeds that prior authorization for linzess is required/requested.   Insurance verification completed.   The patient is insured through [facility] .   Per test claim: PA required; PA started via CoverMyMeds. KEY [id number] . Waiting for clinical questions to populate.

## 2023-04-23 DIAGNOSIS — L219 Seborrheic dermatitis, unspecified: Secondary | ICD-10-CM | POA: Diagnosis not present

## 2023-04-23 DIAGNOSIS — L650 Telogen effluvium: Secondary | ICD-10-CM | POA: Diagnosis not present

## 2023-04-23 DIAGNOSIS — L658 Other specified nonscarring hair loss: Secondary | ICD-10-CM | POA: Diagnosis not present

## 2023-04-24 ENCOUNTER — Other Ambulatory Visit (HOSPITAL_COMMUNITY): Payer: Self-pay

## 2023-04-25 NOTE — Telephone Encounter (Signed)
Additional information has been requested from the patient's insurance in order to proceed with the prior authorization request. Requested information has been sent, or form has been filled out and faxed back to [phone]

## 2023-04-27 ENCOUNTER — Other Ambulatory Visit (HOSPITAL_COMMUNITY): Payer: Self-pay

## 2023-04-27 NOTE — Telephone Encounter (Signed)
Pharmacy Patient Advocate Encounter  Received notification from [facility] that Prior Authorization for LInzess has been 04/19/23 to 04/18/24   PA #/Case ID/Reference #: [id number]

## 2023-05-01 DIAGNOSIS — M722 Plantar fascial fibromatosis: Secondary | ICD-10-CM | POA: Diagnosis not present

## 2023-05-01 NOTE — Telephone Encounter (Signed)
Pharmacy Patient Advocate Encounter  Received notification from [facility] that Prior Authorization for linzess has been APPROVED from 04/19/23 to 04/18/24   PA #/Case ID/Reference #: [id number]

## 2023-05-03 ENCOUNTER — Other Ambulatory Visit: Payer: Self-pay | Admitting: Family Medicine

## 2023-05-03 DIAGNOSIS — J3089 Other allergic rhinitis: Secondary | ICD-10-CM

## 2023-05-08 DIAGNOSIS — L739 Follicular disorder, unspecified: Secondary | ICD-10-CM | POA: Diagnosis not present

## 2023-05-08 DIAGNOSIS — L821 Other seborrheic keratosis: Secondary | ICD-10-CM | POA: Diagnosis not present

## 2023-05-11 ENCOUNTER — Encounter: Payer: Self-pay | Admitting: Family Medicine

## 2023-05-11 DIAGNOSIS — R110 Nausea: Secondary | ICD-10-CM

## 2023-05-11 MED ORDER — PROMETHAZINE HCL 12.5 MG PO TABS: 12.5000 mg | ORAL_TABLET | Freq: Four times a day (QID) | ORAL | 2 refills | Status: DC | PRN

## 2023-05-11 NOTE — Telephone Encounter (Signed)
Okay to refill the phenergan? Last refill was in July

## 2023-05-17 ENCOUNTER — Encounter: Payer: Self-pay | Admitting: Neurology

## 2023-05-17 ENCOUNTER — Ambulatory Visit (INDEPENDENT_AMBULATORY_CARE_PROVIDER_SITE_OTHER): Payer: BC Managed Care – PPO | Admitting: Neurology

## 2023-05-17 ENCOUNTER — Ambulatory Visit: Payer: BC Managed Care – PPO | Admitting: Neurology

## 2023-05-17 ENCOUNTER — Other Ambulatory Visit: Payer: Self-pay | Admitting: Family Medicine

## 2023-05-17 ENCOUNTER — Encounter: Payer: Self-pay | Admitting: Medical

## 2023-05-17 VITALS — BP 127/78

## 2023-05-17 DIAGNOSIS — G43719 Chronic migraine without aura, intractable, without status migrainosus: Secondary | ICD-10-CM

## 2023-05-17 DIAGNOSIS — G43709 Chronic migraine without aura, not intractable, without status migrainosus: Secondary | ICD-10-CM | POA: Diagnosis not present

## 2023-05-17 MED ORDER — TERCONAZOLE 0.4 % VA CREA: 1.0000 | TOPICAL_CREAM | Freq: Every day | VAGINAL | 0 refills | Status: DC

## 2023-05-17 MED ORDER — ONABOTULINUMTOXINA 200 UNITS IJ SOLR
155.0000 [IU] | Freq: Once | INTRAMUSCULAR | Status: AC
  Administered 2023-05-17: 155 [IU] via INTRAMUSCULAR

## 2023-05-17 NOTE — Progress Notes (Signed)
   BOTOX PROCEDURE NOTE FOR MIGRAINE HEADACHE  HISTORY: Here for Botox.  Last was 02/20/2023 by me. Botox wearing off the last few weeks. Claims has done "great" for migraines. Lately migraines less intense. Takes [name] 100 mg works great, is not taking more than 10 a month. Was in the ER in 10/26 for headache, stress/dental issues. CT head November looked fine, after she fell. Has not needed tizanidine.    Previously tried and failed: Topamax, Zonegran, nortriptyline, did not try Depakote due to side effect worry; Maxalt.  Description of procedure:  The patient was placed in a sitting position. The standard protocol was used for Botox as follows, with 5 units of Botox injected at each site:   -Procerus muscle, midline injection  -Corrugator muscle, bilateral injection  -Frontalis muscle, bilateral injection, with 2 sites each side, medial injection was performed in the upper one third of the frontalis muscle, in the region vertical from the medial inferior edge of the superior orbital rim. The lateral injection was again in the upper one third of the forehead vertically above the lateral limbus of the cornea, 1.5 cm lateral to the medial injection site.  -Temporalis muscle injection, 4 sites, bilaterally. The first injection was 3 cm above the tragus of the ear, second injection site was 1.5 cm to 3 cm up from the first injection site in line with the tragus of the ear. The third injection site was 1.5-3 cm forward between the first 2 injection sites. The fourth injection site was 1.5 cm posterior to the second injection site.  -Occipitalis muscle injection, 3 sites, bilaterally. The first injection was done one half way between the occipital protuberance and the tip of the mastoid process behind the ear. The second injection site was done lateral and superior to the first, 1 fingerbreadth from the first injection. The third injection site was 1 fingerbreadth superiorly and medially from the  first injection site.  -Cervical paraspinal muscle injection, 2 sites, bilateral, the first injection site was 1 cm from the midline of the cervical spine, 3 cm inferior to the lower border of the occipital protuberance. The second injection site was 1.5 cm superiorly and laterally to the first injection site.  -Trapezius muscle injection was performed at 3 sites, bilaterally. The first injection site was in the upper trapezius muscle halfway between the inflection point of the neck, and the acromion. The second injection site was one half way between the acromion and the first injection site. The third injection was done between the first injection site and the inflection point of the neck.  A 200 unit bottle of Botox was used, 155 units were injected, the rest of the Botox was wasted. The patient tolerated the procedure well, there were no complications of the above procedure.  Botox NDC 4098-1191-47 Lot number [id number] Expiration date 08/2025 BB  We will continue Botox.  She has been very pleased with benefit.  She will continue [name] as needed, may combine with tizanidine for acute headache.  We discussed if headache frequency increases we may add on CGRP.

## 2023-05-17 NOTE — Progress Notes (Signed)
Botox- 200 units x 1 vial Lot: [id number] Expiration: 2027/03 NDC: 0023-3921-02  Bacteriostatic 0.9% Sodium Chloride- 4 mL  Lot: [id number] Expiration: 09/04/23 NDC: 1324401027  Dx:  O53.664  B/B Witnessed by Karma Lew RN

## 2023-05-21 ENCOUNTER — Ambulatory Visit (INDEPENDENT_AMBULATORY_CARE_PROVIDER_SITE_OTHER): Payer: BC Managed Care – PPO | Admitting: Physician Assistant

## 2023-05-21 ENCOUNTER — Encounter: Payer: Self-pay | Admitting: Physician Assistant

## 2023-05-21 VITALS — BP 109/75 | HR 75 | Temp 98.7°F | Ht 63.0 in | Wt 200.5 lb

## 2023-05-21 DIAGNOSIS — K529 Noninfective gastroenteritis and colitis, unspecified: Secondary | ICD-10-CM | POA: Diagnosis not present

## 2023-05-21 MED ORDER — FAMOTIDINE 20 MG PO TABS: 20.0000 mg | ORAL_TABLET | Freq: Two times a day (BID) | ORAL | 0 refills | Status: DC

## 2023-05-21 NOTE — Progress Notes (Signed)
Established patient visit   Patient: Morgan Long   DOB: 16-Apr-1971   52 y.o. Female  MRN: [id number] Visit Date: 05/21/2023  Today's healthcare provider: Alfredia Ferguson, PA-C   Cc. Nausea, abdominal pain  Subjective     Pt reports she ate food from fresh market Saturday/Sunday, but this is something she has had before. Starting yesterday, upper abdominal pain, nausea, no vomiting but close. She does take phenergen but it makes her tired. Denies blood in stool, does report looser BM.   Medications: Outpatient Medications Prior to Visit  Medication Sig   Apremilast (OTEZLA) 30 MG TABS Take 1 tablet by mouth 2 (two) times daily.   cetirizine (ZYRTEC) 10 MG tablet TAKE 1 TABLET BY MOUTH EVERY DAY   clindamycin (CLEOCIN T) 1 % external solution Apply topically 2 (two) times daily.   cyanocobalamin (VITAMIN B12) 1000 MCG/ML injection Inject 1 mL (1,000 mcg total) into the muscle every 30 (thirty) days.   etonogestrel-ethinyl estradiol (NUVARING) 0.12-0.015 MG/24HR vaginal ring Place 1 each vaginally every 28 (twenty-eight) days. Insert vaginally and leave in place for 3 consecutive weeks, then remove for 1 week.   Multiple Vitamin (MULTIVITAMIN) LIQD Take 5 mLs by mouth daily.   promethazine (PHENERGAN) 12.5 MG tablet Take 1 tablet (12.5 mg total) by mouth every 6 (six) hours as needed for nausea or vomiting.   terconazole (TERAZOL 7) 0.4 % vaginal cream Place 1 applicator vaginally at bedtime.   tiZANidine (ZANAFLEX) 4 MG tablet Take 1 tablet (4 mg total) by mouth every 6 (six) hours as needed for muscle spasms (as needed for muscle tension).   Ubrogepant ([name]) 100 MG TABS Take 1 tablet (100 mg total) by mouth as needed.   Vitamin D, Ergocalciferol, (DRISDOL) 1.25 MG (50000 UNIT) CAPS capsule Take 1 capsule (50,000 Units total) by mouth every 7 (seven) days.   No facility-administered medications prior to visit.    Review of Systems  Constitutional:  Negative for fatigue  and fever.  Respiratory:  Negative for cough and shortness of breath.   Cardiovascular:  Negative for chest pain and leg swelling.  Gastrointestinal:  Positive for abdominal pain and nausea.  Neurological:  Negative for dizziness and headaches.       Objective    BP 109/75   Pulse 75   Temp 98.7 F (37.1 C) (Oral)   Ht 5\' 3"  (1.6 m)   Wt 200 lb 8 oz (90.9 kg)   SpO2 99%   BMI 35.52 kg/m    Physical Exam Constitutional:      General: She is awake.     Appearance: She is well-developed.  HENT:     Head: Normocephalic.  Eyes:     Conjunctiva/sclera: Conjunctivae normal.  Cardiovascular:     Rate and Rhythm: Normal rate and regular rhythm.     Heart sounds: Normal heart sounds.  Pulmonary:     Effort: Pulmonary effort is normal.     Breath sounds: Normal breath sounds.  Abdominal:     Tenderness: There is abdominal tenderness in the epigastric area and left upper quadrant. There is no guarding. Negative signs include Murphy's sign.  Skin:    General: Skin is warm.  Neurological:     Mental Status: She is alert and oriented to person, place, and time.  Psychiatric:        Attention and Perception: Attention normal.        Mood and Affect: Mood normal.  Speech: Speech normal.        Behavior: Behavior is cooperative.      No results found for any visits on 05/21/23.  Assessment & Plan    Gastroenteritis -     Famotidine; Take 1 tablet (20 mg total) by mouth 2 (two) times daily.  Dispense: 60 tablet; Refill: 0 -     Lipase -     CBC with Differential/Platelet -     Comprehensive metabolic panel   Cont phenergen prn, hydrate, bland foods, Will check lipase/cbc/cmp r/o pancreatitis, but low sus.  Rx famotidine bid , recommending tums otc  Return if symptoms worsen or fail to improve.       Alfredia Ferguson, PA-C  [facility] [phone] (phone) [phone] (fax)  [facility]

## 2023-05-22 ENCOUNTER — Encounter: Payer: Self-pay | Admitting: Physician Assistant

## 2023-05-22 DIAGNOSIS — R1013 Epigastric pain: Secondary | ICD-10-CM

## 2023-05-22 LAB — COMPREHENSIVE METABOLIC PANEL
ALT: 14 U/L (ref 0–35)
AST: 15 U/L (ref 0–37)
Albumin: 4.2 g/dL (ref 3.5–5.2)
Alkaline Phosphatase: 76 U/L (ref 39–117)
BUN: 14 mg/dL (ref 6–23)
CO2: 26 meq/L (ref 19–32)
Calcium: 9.3 mg/dL (ref 8.4–10.5)
Chloride: 101 meq/L (ref 96–112)
Creatinine, Ser: 0.76 mg/dL (ref 0.40–1.20)
GFR: 88.75 mL/min (ref >60.00)
Glucose, Bld: 84 mg/dL (ref 70–99)
Potassium: 4.6 meq/L (ref 3.5–5.1)
Sodium: 135 meq/L (ref 135–145)
Total Bilirubin: 0.3 mg/dL (ref 0.2–1.2)
Total Protein: 7.3 g/dL (ref 6.0–8.3)

## 2023-05-22 LAB — CBC WITH DIFFERENTIAL/PLATELET
Basophils Absolute: 0.1 10*3/uL (ref 0.0–0.1)
Basophils Relative: 1.1 % (ref 0.0–3.0)
Eosinophils Absolute: 0 10*3/uL (ref 0.0–0.7)
Eosinophils Relative: 0.3 % (ref 0.0–5.0)
HCT: 41.5 % (ref 36.0–46.0)
Hemoglobin: 13.6 g/dL (ref 12.0–15.0)
Lymphocytes Relative: 25.7 % (ref 12.0–46.0)
Lymphs Abs: 2.5 10*3/uL (ref 0.7–4.0)
MCHC: 32.8 g/dL (ref 30.0–36.0)
MCV: 91.5 fL (ref 78.0–100.0)
Monocytes Absolute: 0.6 10*3/uL (ref 0.1–1.0)
Monocytes Relative: 6.3 % (ref 3.0–12.0)
Neutro Abs: 6.5 10*3/uL (ref 1.4–7.7)
Neutrophils Relative %: 66.6 % (ref 43.0–77.0)
Platelets: 359 10*3/uL (ref 150.0–400.0)
RBC: 4.53 Mil/uL (ref 3.87–5.11)
RDW: 13.8 % (ref 11.5–15.5)
WBC: 9.7 10*3/uL (ref 4.0–10.5)

## 2023-05-22 LAB — LIPASE: Lipase: 75 U/L — ABNORMAL HIGH (ref 11.0–59.0)

## 2023-05-23 ENCOUNTER — Encounter: Payer: Self-pay | Admitting: Physician Assistant

## 2023-05-23 ENCOUNTER — Ambulatory Visit (HOSPITAL_BASED_OUTPATIENT_CLINIC_OR_DEPARTMENT_OTHER)
Admission: RE | Admit: 2023-05-23 | Discharge: 2023-05-23 | Disposition: A | Discharge status: Home / Self Care | Payer: BC Managed Care – PPO | Source: Ambulatory Visit | Attending: Physician Assistant | Admitting: Physician Assistant

## 2023-05-23 DIAGNOSIS — K8020 Calculus of gallbladder without cholecystitis without obstruction: Secondary | ICD-10-CM | POA: Diagnosis not present

## 2023-05-23 DIAGNOSIS — D1803 Hemangioma of intra-abdominal structures: Secondary | ICD-10-CM | POA: Diagnosis not present

## 2023-05-23 DIAGNOSIS — K769 Liver disease, unspecified: Secondary | ICD-10-CM | POA: Diagnosis not present

## 2023-05-23 DIAGNOSIS — R1013 Epigastric pain: Secondary | ICD-10-CM | POA: Insufficient documentation

## 2023-05-24 ENCOUNTER — Encounter: Payer: Self-pay | Admitting: Physician Assistant

## 2023-05-24 NOTE — Telephone Encounter (Signed)
Addressed on [name] result

## 2023-05-25 ENCOUNTER — Other Ambulatory Visit: Payer: Self-pay | Admitting: Physician Assistant

## 2023-05-25 ENCOUNTER — Encounter: Payer: Self-pay | Admitting: Family Medicine

## 2023-05-25 ENCOUNTER — Encounter: Payer: Self-pay | Admitting: Physician Assistant

## 2023-05-25 ENCOUNTER — Other Ambulatory Visit: Payer: Self-pay | Admitting: Family Medicine

## 2023-05-25 ENCOUNTER — Other Ambulatory Visit (INDEPENDENT_AMBULATORY_CARE_PROVIDER_SITE_OTHER): Payer: BC Managed Care – PPO

## 2023-05-25 DIAGNOSIS — R1013 Epigastric pain: Secondary | ICD-10-CM | POA: Diagnosis not present

## 2023-05-25 LAB — BASIC METABOLIC PANEL
BUN: 14 mg/dL (ref 6–23)
CO2: 28 meq/L (ref 19–32)
Calcium: 9.1 mg/dL (ref 8.4–10.5)
Chloride: 104 meq/L (ref 96–112)
Creatinine, Ser: 0.8 mg/dL (ref 0.40–1.20)
GFR: 83.45 mL/min (ref >60.00)
Glucose, Bld: 117 mg/dL — ABNORMAL HIGH (ref 70–99)
Potassium: 3.9 meq/L (ref 3.5–5.1)
Sodium: 139 meq/L (ref 135–145)

## 2023-05-25 LAB — LIPASE: Lipase: 24 U/L (ref 11.0–59.0)

## 2023-05-25 NOTE — Telephone Encounter (Signed)
Spoke with patient, verbalized understanding.  Patient is scheduled for labs at 115 today.

## 2023-05-28 ENCOUNTER — Other Ambulatory Visit: Payer: Self-pay | Admitting: Family

## 2023-05-28 DIAGNOSIS — R110 Nausea: Secondary | ICD-10-CM

## 2023-05-28 DIAGNOSIS — K529 Noninfective gastroenteritis and colitis, unspecified: Secondary | ICD-10-CM

## 2023-05-28 DIAGNOSIS — R1013 Epigastric pain: Secondary | ICD-10-CM

## 2023-05-31 NOTE — Telephone Encounter (Signed)
Copied from CRM [id number]. Topic: Clinical - Request for Lab/Test Order >> May 31, 2023  8:35 AM Pascal Lux wrote: Reason for CRM: Shawna Orleans from [facility] pre-service center called and stated patient has BCBS and needs further authorization before continuing labs. Please call back at  [phone] ext [phone].

## 2023-06-04 NOTE — Telephone Encounter (Signed)
Patient is scheduled for imaging on 1/6

## 2023-06-11 ENCOUNTER — Encounter
Admission: RE | Admit: 2023-06-11 | Discharge: 2023-06-11 | Disposition: A | Discharge status: Home / Self Care | Payer: BC Managed Care – PPO | Source: Ambulatory Visit | Attending: Family | Admitting: Family

## 2023-06-11 ENCOUNTER — Encounter: Payer: Self-pay | Admitting: Family Medicine

## 2023-06-11 ENCOUNTER — Other Ambulatory Visit: Payer: Self-pay | Admitting: Family Medicine

## 2023-06-11 DIAGNOSIS — K529 Noninfective gastroenteritis and colitis, unspecified: Secondary | ICD-10-CM | POA: Diagnosis not present

## 2023-06-11 DIAGNOSIS — R110 Nausea: Secondary | ICD-10-CM | POA: Diagnosis not present

## 2023-06-11 DIAGNOSIS — R1013 Epigastric pain: Secondary | ICD-10-CM | POA: Diagnosis not present

## 2023-06-11 DIAGNOSIS — R739 Hyperglycemia, unspecified: Secondary | ICD-10-CM

## 2023-06-11 DIAGNOSIS — R1011 Right upper quadrant pain: Secondary | ICD-10-CM | POA: Diagnosis not present

## 2023-06-11 MED ORDER — TECHNETIUM TC 99M MEBROFENIN IV KIT
4.9100 | PACK | Freq: Once | INTRAVENOUS | Status: AC | PRN
Start: 2023-06-11 — End: 2023-06-11
  Administered 2023-06-11: 4.91 via INTRAVENOUS

## 2023-06-12 DIAGNOSIS — N760 Acute vaginitis: Secondary | ICD-10-CM | POA: Diagnosis not present

## 2023-06-12 DIAGNOSIS — N898 Other specified noninflammatory disorders of vagina: Secondary | ICD-10-CM | POA: Diagnosis not present

## 2023-06-15 DIAGNOSIS — D134 Benign neoplasm of liver: Secondary | ICD-10-CM | POA: Diagnosis not present

## 2023-06-15 DIAGNOSIS — K8020 Calculus of gallbladder without cholecystitis without obstruction: Secondary | ICD-10-CM | POA: Diagnosis not present

## 2023-07-13 DIAGNOSIS — H0102B Squamous blepharitis left eye, upper and lower eyelids: Secondary | ICD-10-CM | POA: Diagnosis not present

## 2023-07-13 DIAGNOSIS — H0102A Squamous blepharitis right eye, upper and lower eyelids: Secondary | ICD-10-CM | POA: Diagnosis not present

## 2023-07-13 DIAGNOSIS — R1013 Epigastric pain: Secondary | ICD-10-CM | POA: Diagnosis not present

## 2023-07-13 DIAGNOSIS — H04123 Dry eye syndrome of bilateral lacrimal glands: Secondary | ICD-10-CM | POA: Diagnosis not present

## 2023-07-19 ENCOUNTER — Encounter: Payer: Self-pay | Admitting: Neurology

## 2023-07-19 ENCOUNTER — Telehealth: Payer: Self-pay | Admitting: Neurology

## 2023-07-19 DIAGNOSIS — G43709 Chronic migraine without aura, not intractable, without status migrainosus: Secondary | ICD-10-CM | POA: Diagnosis not present

## 2023-07-19 NOTE — Telephone Encounter (Signed)
Pt said she was told that since she is on botox if she were to have migraines she could call and request a cocktail for migraines, please call pt to arrange.

## 2023-07-19 NOTE — Telephone Encounter (Signed)
Returned call to pt and stated no spots today in infusion (per [place]) for migraine cocktail.  Pt stated migraines 3 in the last week, intensity being 10 "feels like someone is smashing my head in from both temples." Taking ubrelvy approximately every other day and tizanidine sparingly only at night (only taken 2 times in past week). Also reported taking 800mg  ibuprofen every other day to avoid rebound ha Will route to sarah to see if their is another option.

## 2023-07-19 NOTE — Telephone Encounter (Signed)
I called the patient. She is going to come for migraine cocktail infusion. Was going to go to the ER.

## 2023-08-10 DIAGNOSIS — K449 Diaphragmatic hernia without obstruction or gangrene: Secondary | ICD-10-CM | POA: Diagnosis not present

## 2023-08-10 DIAGNOSIS — R1013 Epigastric pain: Secondary | ICD-10-CM | POA: Diagnosis not present

## 2023-08-12 ENCOUNTER — Encounter: Payer: Self-pay | Admitting: Family Medicine

## 2023-08-13 ENCOUNTER — Telehealth: Payer: Self-pay

## 2023-08-14 ENCOUNTER — Encounter: Payer: Self-pay | Admitting: Medical

## 2023-08-14 ENCOUNTER — Ambulatory Visit (INDEPENDENT_AMBULATORY_CARE_PROVIDER_SITE_OTHER): Admitting: Medical

## 2023-08-14 VITALS — BP 100/66 | HR 86 | Temp 98.3°F | Resp 18 | Ht 63.0 in | Wt 196.0 lb

## 2023-08-14 DIAGNOSIS — J029 Acute pharyngitis, unspecified: Secondary | ICD-10-CM | POA: Diagnosis not present

## 2023-08-14 DIAGNOSIS — R051 Acute cough: Secondary | ICD-10-CM | POA: Diagnosis not present

## 2023-08-14 DIAGNOSIS — J3489 Other specified disorders of nose and nasal sinuses: Secondary | ICD-10-CM

## 2023-08-14 LAB — POCT RAPID STREP A (OFFICE): Rapid Strep A Screen: NEGATIVE

## 2023-08-14 LAB — POCT INFLUENZA A/B
Influenza A, POC: NEGATIVE
Influenza B, POC: NEGATIVE

## 2023-08-14 LAB — POC COVID19 BINAXNOW: SARS Coronavirus 2 Ag: NEGATIVE

## 2023-08-14 MED ORDER — PROMETHAZINE-DM 6.25-15 MG/5ML PO SYRP
5.0000 mL | ORAL_SOLUTION | Freq: Four times a day (QID) | ORAL | 0 refills | Status: DC | PRN
Start: 2023-08-14 — End: 2023-11-01

## 2023-08-14 MED ORDER — BENZONATATE 100 MG PO CAPS: 100.0000 mg | ORAL_CAPSULE | Freq: Three times a day (TID) | ORAL | 0 refills | Status: DC | PRN

## 2023-08-14 MED ORDER — AZITHROMYCIN 250 MG PO TABS: ORAL_TABLET | ORAL | 0 refills | Status: AC

## 2023-08-14 NOTE — Telephone Encounter (Signed)
 Patient called to check if there is still an available appt,

## 2023-08-14 NOTE — Addendum Note (Signed)
 Addended by: Gwenevere Abbot on: 08/14/2023 04:29 PM   Modules accepted: Orders

## 2023-08-14 NOTE — Progress Notes (Signed)
 Subjective:    Patient ID: Morgan Long, female    DOB: Sep 04, 1970, 53 y.o.   MRN: [id number]  HPI  She presents with a sore throat and cough.  She has a sore throat that feels like it is 'about to close up' and is accompanied by a productive cough. The symptoms began approximately two days ago. The cough produces bright yellow mucus, and she experiences nasal congestion and a sensation of mucus in her throat that she cannot swallow.  She has had difficulty resting over the past couple of days due to the coughing and sore throat. No wheezing is noted, but she feels slightly out of breath, particularly when climbing stairs, and reports a mild sensation of chest congestion.  She recently underwent a procedure involving a scope being passed down her throat to check her stomach due to gallstones. Initially, she was only slightly sore, but the soreness worsened significantly the day after the procedure. She suspects her nephew, who drank from her cup and previously transmitted strep throat to her, might be the source of her current symptoms. No swelling of the tongue or lips is present, but she describes a tight feeling in the back of her throat, as if she could choke. She has tested negative for flu, COVID-19, and strep throat with rapid tests.  She makes her own work hours and has taken the day off due to her symptoms.              Review of Systems  Constitutional:  Positive for fatigue.  HENT:  Positive for congestion and sore throat.   Respiratory:  Positive for cough. Negative for chest tightness, shortness of breath and wheezing.        Some chest congestion  Cardiovascular:  Negative for chest pain and palpitations.  Gastrointestinal:  Negative for abdominal pain.  Genitourinary:  Negative for dysuria, hematuria and urgency.  Musculoskeletal:  Negative for back pain.  Neurological:  Negative for dizziness, seizures, weakness and headaches.  Hematological:  Negative for  adenopathy. Does not bruise/bleed easily.  Psychiatric/Behavioral:  Negative for behavioral problems and confusion.     Past Medical History:  Diagnosis Date   Allergic rhinitis    Asthma    B12 deficiency    Chronic migraine    Dr. Clarisse Gouge   Constipation    Dry eye syndrome of both lacrimal glands    Dry skin    Endometriosis    Fatigue    Gastroparesis    GERD (gastroesophageal reflux disease)    Headache(784.0)    occasional, dx w/ Migraines before, Topamax helps   Heartburn    Hepatic steatosis    History of stomach ulcers    Lactose intolerance    Localized edema    Migraine    Muscle weakness (generalized)    Nuclear cataract of both eyes    Mild   Overweight    Pain in right ankle and joints of right foot    Prediabetes    Shortness of breath    Sinus complaint    Sinusitis    Tired    TMJ pain dysfunction syndrome    occasional   Vitamin D deficiency      Social History   Socioeconomic History   Marital status: Single    Spouse name: Not on file   Number of children: 0   Years of education: BS   Highest education level: Master's degree (e.g., MA, MS, MEng, MEd, MSW, MBA)  Occupational  History   Occupation: Sports coach, going to school    Employer: [employer]   Occupation: Wellsite geologist   Occupation: Chief Executive Officer  Tobacco Use   Smoking status: Never    Passive exposure: Never   Smokeless tobacco: Never  Vaping Use   Vaping status: Never Used  Substance and Sexual Activity   Alcohol use: Not Currently    Comment: socially - occasional    Drug use: No   Sexual activity: Yes    Birth control/protection: Inserts    Comment: nuvaring  Other Topics Concern   Not on file  Social History Narrative   Household:sister and her 3 kids    Drinks occasional starbucks drink       Social Drivers of Health   Financial Resource Strain: Patient Declined (09/29/2022)   Overall Financial Resource Strain (CARDIA)    Difficulty of Paying Living  Expenses: Patient declined  Food Insecurity: Food Insecurity Present (09/29/2022)   Hunger Vital Sign    Worried About Running Out of Food in the Last Year: Patient declined    Ran Out of Food in the Last Year: Sometimes true  Transportation Needs: No Transportation Needs (09/29/2022)   PRAPARE - Administrator, Civil Service (Medical): No    Lack of Transportation (Non-Medical): No  Physical Activity: Insufficiently Active (09/29/2022)   Exercise Vital Sign    Days of Exercise per Week: 2 days    Minutes of Exercise per Session: 50 min  Stress: No Stress Concern Present (09/29/2022)   [employer] of Occupational Health - Occupational Stress Questionnaire    Feeling of Stress : Only a little  Social Connections: Moderately Integrated (09/29/2022)   Social Connection and Isolation Panel [NHANES]    Frequency of Communication with Friends and Family: More than three times a week    Frequency of Social Gatherings with Friends and Family: Once a week    Attends Religious Services: 1 to 4 times per year    Active Member of [employer] or Organizations: Yes    Attends Banker Meetings: 1 to 4 times per year    Marital Status: Never married  Intimate Partner Violence: Not on file    Past Surgical History:  Procedure Laterality Date   ANKLE SURGERY Left 11/16/2017   AUGMENTATION MAMMAPLASTY Bilateral 2006   BREAST ENHANCEMENT SURGERY  2006   BUNIONECTOMY     COLONOSCOPY     endrometroisis     fallopian tube removed     Left   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   UPPER GASTROINTESTINAL ENDOSCOPY     [place] teeth extraction      Family History  Problem Relation Age of Onset   Throat cancer Mother    Cancer - Other Mother 18       throat - died 6 months   Cancer Mother    Alcoholism Mother    Stroke Father    Heart disease Father    Cancer Father    Colon cancer Paternal Grandfather    Breast cancer Maternal Aunt         breast   Breast cancer Maternal Aunt        breast   Pancreatic cancer Maternal Aunt    Hypertension Maternal Aunt        several family members   Breast cancer Maternal Aunt        total of 5 aunts  Breast cancer Maternal Aunt    Colon cancer Maternal Uncle 44       died 2044/09/08   Throat cancer Maternal Uncle    Diabetes Other        grandmother   Asthma Other        cousin, maternal   Heart attack Neg Hx    Rectal cancer Neg Hx    Stomach cancer Neg Hx     Allergies  Allergen Reactions   Acetaminophen-Codeine Itching   Hydrocodone Nausea Only   Oxycodone Itching   Tyloxapol Other (See Comments)    Other reaction(s): Unknown   Codeine Itching and Nausea Only    Current Outpatient Medications on File Prior to Visit  Medication Sig Dispense Refill   Apremilast (OTEZLA) 30 MG TABS Take 1 tablet by mouth 2 (two) times daily.     cetirizine (ZYRTEC) 10 MG tablet TAKE 1 TABLET BY MOUTH EVERY DAY 90 tablet 1   clindamycin (CLEOCIN T) 1 % external solution Apply topically 2 (two) times daily. 30 mL 5   cyanocobalamin (VITAMIN B12) 1000 MCG/ML injection Inject 1 mL (1,000 mcg total) into the muscle every 30 (thirty) days. 3 mL 3   etonogestrel-ethinyl estradiol (NUVARING) 0.12-0.015 MG/24HR vaginal ring Place 1 each vaginally every 28 (twenty-eight) days. Insert vaginally and leave in place for 3 consecutive weeks, then remove for 1 week.     famotidine (PEPCID) 20 MG tablet Take 1 tablet (20 mg total) by mouth 2 (two) times daily. 60 tablet 0   Multiple Vitamin (MULTIVITAMIN) LIQD Take 5 mLs by mouth daily.     promethazine (PHENERGAN) 12.5 MG tablet Take 1 tablet (12.5 mg total) by mouth every 6 (six) hours as needed for nausea or vomiting. 30 tablet 2   terconazole (TERAZOL 7) 0.4 % vaginal cream Place 1 applicator vaginally at bedtime. 45 g 0   tiZANidine (ZANAFLEX) 4 MG tablet Take 1 tablet (4 mg total) by mouth every 6 (six) hours as needed for muscle spasms (as needed for  muscle tension). 20 tablet 1   Ubrogepant ([name]) 100 MG TABS Take 1 tablet (100 mg total) by mouth as needed. 12 tablet 11   Vitamin D, Ergocalciferol, (DRISDOL) 1.25 MG (50000 UNIT) CAPS capsule Take 1 capsule (50,000 Units total) by mouth every 7 (seven) days. 4 capsule 0   No current facility-administered medications on file prior to visit.    BP 100/66   Pulse 86   Temp 98.3 F (36.8 C)   Resp 18   Ht 5\' 3"  (1.6 m)   Wt 196 lb (88.9 kg)   SpO2 98%   BMI 34.72 kg/m        Objective:   Physical Exam  General Mental Status- Alert. General Appearance- Not in acute distress.   Skin General: Color- Normal Color. Moisture- Normal Moisture.  Neck Carotid Arteries- Normal color. Moisture- Normal Moisture. No carotid bruits. No JVD.  Chest and Lung Exam Auscultation: Breath Sounds:-CTA  Cardiovascular Auscultation:Rythm- RRR Murmurs & Other Heart Sounds:Auscultation of the heart reveals- No Murmurs.  Abdomen Inspection:-Inspeection Normal. Palpation/Percussion:Note:No mass. Palpation and Percussion of the abdomen reveal- Non Tender, Non Distended + BS, no rebound or guarding.  Heent- moderate bright red posterior pharynx. Left submandibular node mild enlarged. Mild frontal sinus pressure to palpation. Neurologic Cranial Nerve exam:- CN III-XII intact(No nystagmus), symmetric smile. Strength:- 5/5 equal and symmetric strength both upper and lower extremities.       Assessment & Plan:   Patient Instructions  Pharyngitis Acute sore throat with negative rapid tests for influenza, COVID-19, and streptococcal pharyngitis. Differential includes viral or bacterial infection. Send-out throat culture warranted due to potential false negatives.(note appearance of throat) - Order send-out throat culture. - Prescribe azithromycin 5-day course. - Prescribe Phenergan DM for cough and decongestant effects. - Advise to monitor symptoms and update on culture results in 48-72  hours.  Some potential allergy component to sigs/symptoms. -consider otc claritin if needed.  -Saline nasal spray.   Follow-up Follow-up required to assess throat culture results and clinical improvement. - Update on throat culture results in 48-72 hours. - Advise on continuation or discontinuation of antibiotics based on test results and clinical status.   Esperanza Richters, PA-C

## 2023-08-14 NOTE — Patient Instructions (Addendum)
 Pharyngitis Acute sore throat with negative rapid tests for influenza, COVID-19, and streptococcal pharyngitis. Differential includes viral or bacterial infection. Send-out throat culture warranted due to potential false negatives.(note appearance of throat) -rapid flu, covid and strep all negative. - Order send-out throat culture. - Prescribe azithromycin 5-day course. - Prescribe Phenergan DM for cough and decongestant effects. - Advise to monitor symptoms and update on culture results in 48-72 hours.  Some potential allergy component to sigs/symptoms. -consider otc claritin if needed.  -Saline nasal spray.   Follow-up Follow-up required to assess throat culture results and clinical improvement. - Update on throat culture results in 48-72 hours. - Advise on continuation or discontinuation of antibiotics based on test results and clinical status.

## 2023-08-15 ENCOUNTER — Telehealth (INDEPENDENT_AMBULATORY_CARE_PROVIDER_SITE_OTHER): Admitting: Neurology

## 2023-08-15 ENCOUNTER — Ambulatory Visit (HOSPITAL_BASED_OUTPATIENT_CLINIC_OR_DEPARTMENT_OTHER)
Admission: RE | Admit: 2023-08-15 | Discharge: 2023-08-15 | Disposition: A | Discharge status: Home / Self Care | Source: Ambulatory Visit | Attending: Medical | Admitting: Medical

## 2023-08-15 DIAGNOSIS — G43709 Chronic migraine without aura, not intractable, without status migrainosus: Secondary | ICD-10-CM | POA: Diagnosis not present

## 2023-08-15 DIAGNOSIS — R058 Other specified cough: Secondary | ICD-10-CM | POA: Diagnosis not present

## 2023-08-15 DIAGNOSIS — R051 Acute cough: Secondary | ICD-10-CM | POA: Diagnosis not present

## 2023-08-15 MED ORDER — NURTEC 75 MG PO TBDP: 75.0000 mg | ORAL_TABLET | ORAL | 11 refills | Status: DC

## 2023-08-15 NOTE — Addendum Note (Signed)
 Addended by: Gwenevere Abbot on: 08/15/2023 01:42 PM   Modules accepted: Orders

## 2023-08-15 NOTE — Progress Notes (Addendum)
 Patient: Morgan Long Date of Birth: 11-05-1970  Reason for Visit: Follow up History from: Patient Primary Neurologist: Frances Furbish  Virtual Visit via Video Note  I connected with Morgan Long on 08/15/23 at 1445 PM by a video enabled telemedicine application and verified that I am speaking with the correct person using two identifiers.  Location: Patient: in her car parked Provider: in the office    I discussed the limitations of evaluation and management by telemedicine and the availability of in person appointments. The patient expressed understanding and agreed to proceed.    ASSESSMENT AND PLAN 53 y.o. year old female   1.  Chronic migraine headache  -Continue Botox every 3 months for migraine prevention.  Has had excellent benefit.  Does have some breakthrough headaches, especially about 3 weeks before next Botox -Add on Nurtec 75 mg tablet up to every other day for migraine prevention/acute treatment. Will stop the [name] since not longer helping. At onset of severe headache, add in Aleve, even tizanidine  -Previously tried and failed: Topamax, Zonegran, nortriptyline, did not try Depakote due to side effect worry; Maxalt, Imitrex, Bernita Raisin  -Next steps: Add on [place] or Emgality, Relpax, Amerge  -Follow-up next week for Botox  HISTORY OF PRESENT ILLNESS: Today 08/15/23 Here to discuss migraines. Last had Botox 05/17/23 with me. Came to office for migraine cocktail 07/19/23 (Depacon, Compazine, Toradol). Due next week for Botox. Having more migraines, having some congestion. Had upper endoscopy last week gave her migraine, they gave her Toradol. The last 2 weeks daily migraine. Once Botox kicks in, will be migraine free for 1-2 months. Doesn't feel the Bernita Raisin is working any longer.   Update 03/14/23 SS: Last saw me 02/20/2023 for Botox. Under a lot of stress, she moved houses. Her sister and her kids are now living with her. Botox doing well for her. Migraines under  good control. Has tizanidine as needed for muscle tension in her neck and shoulders than can trigger migraines. Has zero migraines until Botox wears off. Has mild to moderate headache every few weeks. Bernita Raisin works well when she takes it. 100 mg works good doesn't have to repeat. Is down 10 lbs, couldn't get the [facility] anymore. Migraines are right sided temple, migraine features.   HISTORY  Ms. Rusk is a 53 year old right-handed woman with an underlying medical history of allergic rhinitis, endometriosis, TMJ problems, and neck pain, as well as obesity, who presents for evaluation of her headaches. Her headache specialist retired. I last saw her on 09/07/2015 for follow-up for her blurry vision. Her headaches were stable. She had come off of Zonegran and was started on nortriptyline by Dr. Vela Prose.    04/25/17: She reports a long-standing history of migraine headaches, she recalls she was about 53 years old when she started having migraines. For quite some time she had spontaneous improvement in her migraines but some 4 or 5 years ago started having more frequent migraines. She has tried and failed multiple preventative medications but does not have a list with her and prior records from Dr. Glade Stanford office are not available today. She reports that his office is closed at this point. She did not get her records from his office. Perhaps her primary care provider has records. I encouraged her to try to obtain those records as it will help [name] manage her migraines optimally. She recalls having tried Topamax. She had hair loss from some medications but is not completely sure which ones. Topamax did not help,  Zonegran caused her sedation, nortriptyline was also tried but she is unsure what happened with it. Amitriptyline she may have tried as well. She did not try Depakote. She is worried about sedation side effects as she also is going to school and has to be alert throughout the day. She takes occasional nausea  medicine which likely is Zofran under the tongue. She has tried Maxalt under the tongue which is helpful at times. She needs a refill on that. She has regular eye exams because of her history of dry eyes. She used to wear a single contact but was advised to stop using it.  She has had 2 rounds of Botox injections, last time about 3 months ago as she recalls. She remembers doing okay with it. Prior to starting Botox injections she reports having about 18-20 headache days per month typically. She does not always have a typical aura but sometimes has visual blurriness and bringing in her ears to warn her about an upcoming migraine headache. She has occasional nausea that precedes the headache.  REVIEW OF SYSTEMS: Out of a complete 14 system review of symptoms, the patient complains only of the following symptoms, and all other reviewed systems are negative.  See HPI  ALLERGIES: Allergies  Allergen Reactions   Acetaminophen-Codeine Itching   Hydrocodone Nausea Only   Oxycodone Itching   Tyloxapol Other (See Comments)    Other reaction(s): Unknown   Codeine Itching and Nausea Only    HOME MEDICATIONS: Outpatient Medications Prior to Visit  Medication Sig Dispense Refill   Apremilast (OTEZLA) 30 MG TABS Take 1 tablet by mouth 2 (two) times daily.     azithromycin (ZITHROMAX) 250 MG tablet Take 2 tablets on day 1, then 1 tablet daily on days 2 through 5 6 tablet 0   cetirizine (ZYRTEC) 10 MG tablet TAKE 1 TABLET BY MOUTH EVERY DAY 90 tablet 1   clindamycin (CLEOCIN T) 1 % external solution Apply topically 2 (two) times daily. 30 mL 5   cyanocobalamin (VITAMIN B12) 1000 MCG/ML injection Inject 1 mL (1,000 mcg total) into the muscle every 30 (thirty) days. 3 mL 3   etonogestrel-ethinyl estradiol (NUVARING) 0.12-0.015 MG/24HR vaginal ring Place 1 each vaginally every 28 (twenty-eight) days. Insert vaginally and leave in place for 3 consecutive weeks, then remove for 1 week.     famotidine (PEPCID)  20 MG tablet Take 1 tablet (20 mg total) by mouth 2 (two) times daily. 60 tablet 0   Multiple Vitamin (MULTIVITAMIN) LIQD Take 5 mLs by mouth daily.     promethazine (PHENERGAN) 12.5 MG tablet Take 1 tablet (12.5 mg total) by mouth every 6 (six) hours as needed for nausea or vomiting. 30 tablet 2   promethazine-dextromethorphan (PROMETHAZINE-DM) 6.25-15 MG/5ML syrup Take 5 mLs by mouth 4 (four) times daily as needed for cough. 118 mL 0   terconazole (TERAZOL 7) 0.4 % vaginal cream Place 1 applicator vaginally at bedtime. 45 g 0   tiZANidine (ZANAFLEX) 4 MG tablet Take 1 tablet (4 mg total) by mouth every 6 (six) hours as needed for muscle spasms (as needed for muscle tension). 20 tablet 1   Ubrogepant ([name]) 100 MG TABS Take 1 tablet (100 mg total) by mouth as needed. 12 tablet 11   Vitamin D, Ergocalciferol, (DRISDOL) 1.25 MG (50000 UNIT) CAPS capsule Take 1 capsule (50,000 Units total) by mouth every 7 (seven) days. 4 capsule 0   No facility-administered medications prior to visit.    PAST MEDICAL HISTORY: Past  Medical History:  Diagnosis Date   Allergic rhinitis    Asthma    B12 deficiency    Chronic migraine    Dr. Clarisse Gouge   Constipation    Dry eye syndrome of both lacrimal glands    Dry skin    Endometriosis    Fatigue    Gastroparesis    GERD (gastroesophageal reflux disease)    Headache(784.0)    occasional, dx w/ Migraines before, Topamax helps   Heartburn    Hepatic steatosis    History of stomach ulcers    Lactose intolerance    Localized edema    Migraine    Muscle weakness (generalized)    Nuclear cataract of both eyes    Mild   Overweight    Pain in right ankle and joints of right foot    Prediabetes    Shortness of breath    Sinus complaint    Sinusitis    Tired    TMJ pain dysfunction syndrome    occasional   Vitamin D deficiency     PAST SURGICAL HISTORY: Past Surgical History:  Procedure Laterality Date   ANKLE SURGERY Left 11/16/2017    AUGMENTATION MAMMAPLASTY Bilateral 2006   BREAST ENHANCEMENT SURGERY  2006   BUNIONECTOMY     COLONOSCOPY     endrometroisis     fallopian tube removed     Left   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   UPPER GASTROINTESTINAL ENDOSCOPY     [facility] teeth extraction      FAMILY HISTORY: Family History  Problem Relation Age of Onset   Throat cancer Mother    Cancer - Other Mother 34       throat - died 6 months   Cancer Mother    Alcoholism Mother    Stroke Father    Heart disease Father    Cancer Father    Colon cancer Paternal Grandfather    Breast cancer Maternal Aunt        breast   Breast cancer Maternal Aunt        breast   Pancreatic cancer Maternal Aunt    Hypertension Maternal Aunt        several family members   Breast cancer Maternal Aunt        total of 5 aunts   Breast cancer Maternal Aunt    Colon cancer Maternal Uncle 44       died 68   Throat cancer Maternal Uncle    Diabetes Other        grandmother   Asthma Other        cousin, maternal   Heart attack Neg Hx    Rectal cancer Neg Hx    Stomach cancer Neg Hx     SOCIAL HISTORY: Social History   Socioeconomic History   Marital status: Single    Spouse name: Not on file   Number of children: 0   Years of education: BS   Highest education level: Master's degree (e.g., MA, MS, MEng, MEd, MSW, MBA)  Occupational History   Occupation: Sports coach, going to school    Employer: [employer]   Occupation: Wellsite geologist   Occupation: Chief Executive Officer  Tobacco Use   Smoking status: Never    Passive exposure: Never   Smokeless tobacco: Never  Vaping Use   Vaping status: Never Used  Substance and Sexual Activity   Alcohol use: Not Currently  Comment: socially - occasional    Drug use: No   Sexual activity: Yes    Birth control/protection: Inserts    Comment: nuvaring  Other Topics Concern   Not on file  Social History Narrative    Household:sister and her 3 kids    Drinks occasional starbucks drink       Social Drivers of Health   Financial Resource Strain: Patient Declined (09/29/2022)   Overall Financial Resource Strain (CARDIA)    Difficulty of Paying Living Expenses: Patient declined  Food Insecurity: Food Insecurity Present (09/29/2022)   Hunger Vital Sign    Worried About Running Out of Food in the Last Year: Patient declined    Ran Out of Food in the Last Year: Sometimes true  Transportation Needs: No Transportation Needs (09/29/2022)   PRAPARE - Administrator, Civil Service (Medical): No    Lack of Transportation (Non-Medical): No  Physical Activity: Insufficiently Active (09/29/2022)   Exercise Vital Sign    Days of Exercise per Week: 2 days    Minutes of Exercise per Session: 50 min  Stress: No Stress Concern Present (09/29/2022)   [employer] of Occupational Health - Occupational Stress Questionnaire    Feeling of Stress : Only a little  Social Connections: Moderately Integrated (09/29/2022)   Social Connection and Isolation Panel [NHANES]    Frequency of Communication with Friends and Family: More than three times a week    Frequency of Social Gatherings with Friends and Family: Once a week    Attends Religious Services: 1 to 4 times per year    Active Member of [employer] or Organizations: Yes    Attends Banker Meetings: 1 to 4 times per year    Marital Status: Never married  Intimate Partner Violence: Not on file   PHYSICAL EXAM  There were no vitals filed for this visit.  There is no height or weight on file to calculate BMI.  Via video visit, seated in her car, is alert and oriented, speech is clear and concise, facial symmetry noted, follows commands, wearing dark sunglasses  DIAGNOSTIC DATA (LABS, IMAGING, TESTING) - I reviewed patient records, labs, notes, testing and imaging myself where available.  Lab Results  Component Value Date   WBC 9.7 05/21/2023    HGB 13.6 05/21/2023   HCT 41.5 05/21/2023   MCV 91.5 05/21/2023   PLT 359.0 05/21/2023      Component Value Date/Time   NA 139 05/25/2023 1310   NA 139 09/25/2022 1107   K 3.9 05/25/2023 1310   CL 104 05/25/2023 1310   CO2 28 05/25/2023 1310   GLUCOSE 117 (H) 05/25/2023 1310   BUN 14 05/25/2023 1310   BUN 14 09/25/2022 1107   CREATININE 0.80 05/25/2023 1310   CALCIUM 9.1 05/25/2023 1310   PROT 7.3 05/21/2023 1520   PROT 7.4 09/25/2022 1107   ALBUMIN 4.2 05/21/2023 1520   ALBUMIN 4.3 09/25/2022 1107   AST 15 05/21/2023 1520   ALT 14 05/21/2023 1520   ALKPHOS 76 05/21/2023 1520   BILITOT 0.3 05/21/2023 1520   BILITOT 0.3 09/25/2022 1107   GFRNONAA >60 11/16/2022 1859   GFRAA >60 12/15/2019 0733   Lab Results  Component Value Date   CHOL 230 (H) 02/12/2023   HDL 73.90 02/12/2023   LDLCALC 134 (H) 02/12/2023   TRIG 108.0 02/12/2023   CHOLHDL 3 02/12/2023   Lab Results  Component Value Date   HGBA1C 5.9 02/12/2023   Lab Results  Component Value Date   VITAMINB12 1,150 (H) 02/12/2023   Lab Results  Component Value Date   TSH 0.88 02/12/2023   Margie Ege, AGNP-C, DNP 08/15/2023, 6:02 AM [facility] [address] [place], [place] [address] [phone]

## 2023-08-15 NOTE — Patient Instructions (Signed)
 We will continue Botox.  Try Nurtec at acute onset of migraine in place of [name].  May combine with Aleve, even tizanidine for severe headache.  Follow-up next week for Botox.  Thanks

## 2023-08-16 ENCOUNTER — Encounter: Payer: Self-pay | Admitting: Medical

## 2023-08-16 LAB — CULTURE, GROUP A STREP
Micro Number: 16186491
SPECIMEN QUALITY:: ADEQUATE

## 2023-08-17 ENCOUNTER — Encounter: Payer: Self-pay | Admitting: Family Medicine

## 2023-08-21 ENCOUNTER — Other Ambulatory Visit: Payer: Self-pay | Admitting: Family Medicine

## 2023-08-21 ENCOUNTER — Ambulatory Visit (INDEPENDENT_AMBULATORY_CARE_PROVIDER_SITE_OTHER): Admitting: Family Medicine

## 2023-08-21 ENCOUNTER — Encounter: Payer: Self-pay | Admitting: Family Medicine

## 2023-08-21 VITALS — BP 108/88 | HR 90 | Temp 98.9°F | Resp 18 | Ht 63.0 in | Wt 194.6 lb

## 2023-08-21 DIAGNOSIS — R110 Nausea: Secondary | ICD-10-CM

## 2023-08-21 DIAGNOSIS — J0140 Acute pansinusitis, unspecified: Secondary | ICD-10-CM

## 2023-08-21 DIAGNOSIS — N898 Other specified noninflammatory disorders of vagina: Secondary | ICD-10-CM | POA: Diagnosis not present

## 2023-08-21 DIAGNOSIS — R1013 Epigastric pain: Secondary | ICD-10-CM | POA: Diagnosis not present

## 2023-08-21 DIAGNOSIS — K8020 Calculus of gallbladder without cholecystitis without obstruction: Secondary | ICD-10-CM | POA: Diagnosis not present

## 2023-08-21 DIAGNOSIS — R051 Acute cough: Secondary | ICD-10-CM

## 2023-08-21 DIAGNOSIS — D134 Benign neoplasm of liver: Secondary | ICD-10-CM | POA: Diagnosis not present

## 2023-08-21 MED ORDER — TERCONAZOLE 0.8 % VA CREA: 1.0000 | TOPICAL_CREAM | Freq: Every day | VAGINAL | 0 refills | Status: DC

## 2023-08-21 MED ORDER — TERCONAZOLE 0.4 % VA CREA: 1.0000 | TOPICAL_CREAM | Freq: Every day | VAGINAL | 0 refills | Status: DC

## 2023-08-21 MED ORDER — AMOXICILLIN-POT CLAVULANATE 875-125 MG PO TABS: 1.0000 | ORAL_TABLET | Freq: Two times a day (BID) | ORAL | 0 refills | Status: DC

## 2023-08-21 MED ORDER — PROMETHAZINE-DM 6.25-15 MG/5ML PO SYRP: 5.0000 mL | ORAL_SOLUTION | Freq: Four times a day (QID) | ORAL | 0 refills | Status: DC | PRN

## 2023-08-21 MED ORDER — PROMETHAZINE HCL 12.5 MG PO TABS: 12.5000 mg | ORAL_TABLET | Freq: Four times a day (QID) | ORAL | 2 refills | Status: DC | PRN

## 2023-08-21 NOTE — Progress Notes (Signed)
 Established Patient Office Visit  Subjective   Patient ID: Morgan Long, female    DOB: 25-Jan-1971  Age: 53 y.o. MRN: [id number]  Chief Complaint  Patient presents with   Cough    Pt seen by S[facility]ier on 03/11. Pt tested negative for flu, covid, and strep. Throat culture showed no strep. Pt states sxs are about the same. Pt states not feeling as weak today and is requesting medication for yeast infection from the antibiotics    Follow-up    HPI Discussed the use of AI scribe software for clinical note transcription with the patient, who gave verbal consent to proceed.  History of Present Illness   Morgan Long is a 53 year old female who presents with persistent cough and throat discomfort.  She has been experiencing a persistent productive cough and throat discomfort since August 13, 2023, which began the night before her last visit on August 14, 2023. The cough produces thick, gooey yellowish-green sputum, and she feels as though her throat is closed. There is a sensation of something 'gooey' in her throat that she has to 'push fast'. She experiences hoarseness, reduced coughing, and occasional feverishness, primarily at night, along with body aches.  She tested negative for COVID-19 and flu, with a throat culture also returning negative. Despite these results, she remains concerned due to her past experiences with COVID-19, where she tested negative multiple times before a positive result.  She was prescribed azithromycin (Z-Pak) and has been using Phenergan DM for her symptoms, which has helped at night. She also takes Phenergan for nausea, using half a pill as the full dose causes drowsiness. The antibiotic caused a yeast infection, for which she uses Terazol.  She mentions having headaches, which she attributes to being due for her Botox shots, and describes them as being over her sinuses. Her nose has been running, producing white discharge when blown. She has been using a saline  gel for nasal comfort.  She has a history of being cautious about her health, contrasting with her sister who does not visit the doctor regularly despite having similar symptoms.      Patient Active Problem List   Diagnosis Date Noted   Other hyperlipidemia 10/09/2022   BMI 37.0-37.9, adult Current BMI 37.7 10/09/2022   Acute cough 07/18/2022   Polyphagia 06/15/2022   Right groin mass 03/28/2022   Impacted cerumen of left ear 10/13/2021   Chronic constipation 10/04/2021   At risk for impaired metabolic function 10/04/2021   Allergic conjunctivitis of both eyes 08/25/2021   B12 deficiency 06/23/2021   Prediabetes 06/23/2021   Vitamin D deficiency 06/23/2021   Hematoma 05/25/2021   Changing skin lesion 05/13/2021   Frequent PVCs 12/30/2020   DOE (dyspnea on exertion) 12/30/2020   Allergic contact dermatitis due to cosmetics 12/10/2020   Newly recognized murmur 12/10/2020   Herpes zoster 11/12/2020   Preventative health care 11/04/2020   Shortness of breath    COVID-19 09/13/2020   Adhesive capsulitis of left shoulder 08/04/2020   Pain in joint of left shoulder 08/03/2020   Hepatic adenoma 06/08/2020   Liver lesion 05/04/2020   TMJ pain dysfunction syndrome    Tired    Sinus complaint    SOB (shortness of breath)    Pain in right ankle and joints of right foot    Overweight    Muscle weakness (generalized)    Migraine    Localized edema    Lactose intolerance    History of  stomach ulcers    Hepatic steatosis    Heartburn    Fatigue    Dry skin    Dry eye syndrome of both lacrimal glands    Constipation    Chronic migraine    Asthma    History of COVID-19 01/05/2020   Anxiety 05/12/2019   Abnormality of lung on CXR 03/06/2019   Moderate persistent asthma 01/23/2019   Lower extremity edema 01/23/2019   Capsulitis of ankle, left 09/24/2018   Synovitis of left ankle 09/24/2018   Ankle injury, initial encounter 05/23/2018   Acute pain of left shoulder 03/12/2018    S/P repair of ligament of ankle 03/05/2018   Chronic pain of right ankle 10/28/2017   Other fatigue 08/14/2017   Dyspnea 08/14/2017   Other insomnia 08/14/2017   Medication management 06/12/2017   Nonintractable headache 06/12/2017   Gastroparesis 06/12/2017   Class 2 severe obesity with serious comorbidity and body mass index (BMI) of 35.0 to 35.9 in adult (HCC) 04/25/2017   Leukocytosis 03/01/2017   Generalized obesity 03/01/2017   Chronic migraine without aura without status migrainosus, not intractable 03/01/2017   Sinusitis 07/31/2016   Influenza-like illness 07/12/2016   Peroneal tendinitis of left lower extremity 11/03/2015   Pes anserine bursitis 11/03/2015   Asthma with acute exacerbation 06/15/2015   Acute sinusitis 06/15/2015   GERD (gastroesophageal reflux disease) 01/13/2015   Myalgia and myositis 07/22/2014   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 Clarisse Gouge   Constipation    Dry eye syndrome of both lacrimal glands    Dry skin    Endometriosis    Fatigue    Gastroparesis    GERD (gastroesophageal reflux disease)    Headache(784.0)    occasional, dx w/ Migraines before, Topamax helps   Heartburn    Hepatic steatosis    History of stomach ulcers    Lactose intolerance    Localized edema    Migraine    Muscle weakness (generalized)    Nuclear cataract of both eyes    Mild   Overweight    Pain in right ankle and joints of right foot    Prediabetes    Shortness of breath    Sinus complaint     Sinusitis    Tired    TMJ pain dysfunction syndrome    occasional   Vitamin D deficiency    Past Surgical History:  Procedure Laterality Date   ANKLE SURGERY Left 11/16/2017   AUGMENTATION MAMMAPLASTY Bilateral 2006   BREAST ENHANCEMENT SURGERY  2006   BUNIONECTOMY     COLONOSCOPY     endrometroisis     fallopian tube removed     Left   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   UPPER GASTROINTESTINAL ENDOSCOPY     [facility] teeth extraction     Social History   Tobacco Use   Smoking status: Never    Passive exposure: Never   Smokeless tobacco: Never  Vaping Use   Vaping status: Never Used  Substance Use Topics   Alcohol use: Not Currently    Comment: socially - occasional    Drug use: No   Social History   Socioeconomic History   Marital status: Single    Spouse name: Not on file   Number of children: 0   Years of education: BS   Highest education level: Master's degree (e.g., MA, MS, MEng, MEd, MSW, MBA)  Occupational History   Occupation: Sports coach, going to school    Employer: [employer]   Occupation: Wellsite geologist   Occupation: Chief Executive Officer  Tobacco Use   Smoking status: Never    Passive exposure: Never   Smokeless tobacco: Never  Vaping Use   Vaping status: Never Used  Substance and Sexual Activity   Alcohol use: Not Currently    Comment: socially - occasional    Drug use: No   Sexual activity: Yes    Birth control/protection: Inserts    Comment: nuvaring  Other Topics Concern   Not on file  Social History Narrative   Household:sister and her 3 kids    Drinks occasional starbucks drink       Social Drivers of Health   Financial Resource Strain: Patient Declined (09/29/2022)   Overall Financial Resource Strain (CARDIA)    Difficulty of Paying Living Expenses: Patient declined  Food Insecurity: Food Insecurity Present (09/29/2022)   Hunger Vital Sign    Worried About Running Out of Food in the  Last Year: Patient declined    Ran Out of Food in the Last Year: Sometimes true  Transportation Needs: No Transportation Needs (09/29/2022)   PRAPARE - Administrator, Civil Service (Medical): No    Lack of Transportation (Non-Medical): No  Physical Activity: Insufficiently Active (09/29/2022)   Exercise Vital Sign    Days of Exercise per Week: 2 days    Minutes of Exercise per Session: 50 min  Stress: No Stress Concern Present (09/29/2022)   [employer] of Occupational Health - Occupational Stress Questionnaire    Feeling of Stress : Only a little  Social Connections: Moderately Integrated (09/29/2022)   Social Connection and Isolation Panel [NHANES]    Frequency of Communication with Friends and Family: More than three times a week    Frequency of Social Gatherings with Friends and Family: Once a week    Attends Religious Services: 1 to 4 times per year    Active Member of [employer] or Organizations: Yes    Attends Banker Meetings: 1 to 4 times per year    Marital Status: Never married  Catering manager Violence: Not on file   Family Status  Relation Name Status   Mother  Deceased at age 4       throat cancer   Father  Deceased at age 87       MI   PGF  (Not Specified)   Mat Aunt  Deceased   Mat Aunt  Deceased   Mat Aunt  Deceased   Mat Aunt  Deceased   Mat Aunt  Alive   Mat Aunt  Alive   Mat Aunt  (Not Specified)   Mat Aunt  (Not Specified)   Mat Aunt  (Not Specified)   Mat Uncle  Deceased       throat   Mat Uncle  Deceased       pancreatic   Mat Uncle  (Not Specified)   Other  (Not Specified)  Other  (Not Specified)   Neg Hx  (Not Specified)  No partnership data on file   Family History  Problem Relation Age of Onset   Throat cancer Mother    Cancer - Other Mother 44       throat - died 6 months   Cancer Mother    Alcoholism Mother    Stroke Father    Heart disease Father    Cancer Father    Colon cancer Paternal Grandfather     Breast cancer Maternal Aunt        breast   Breast cancer Maternal Aunt        breast   Pancreatic cancer Maternal Aunt    Hypertension Maternal Aunt        several family members   Breast cancer Maternal Aunt        total of 5 aunts   Breast cancer Maternal Aunt    Colon cancer Maternal Uncle 44       died 43   Throat cancer Maternal Uncle    Diabetes Other        grandmother   Asthma Other        cousin, maternal   Heart attack Neg Hx    Rectal cancer Neg Hx    Stomach cancer Neg Hx    Allergies  Allergen Reactions   Acetaminophen-Codeine Itching   Hydrocodone Nausea Only   Oxycodone Itching   Tyloxapol Other (See Comments)    Other reaction(s): Unknown   Codeine Itching and Nausea Only      Review of Systems  Constitutional:  Negative for fever and malaise/fatigue.  HENT:  Positive for congestion and sinus pain.   Eyes:  Negative for blurred vision.  Respiratory:  Positive for cough and sputum production. Negative for shortness of breath.   Cardiovascular:  Negative for chest pain, palpitations and leg swelling.  Gastrointestinal:  Negative for abdominal pain, blood in stool and nausea.  Genitourinary:  Negative for dysuria and frequency.  Musculoskeletal:  Negative for falls.  Skin:  Negative for rash.  Neurological:  Negative for dizziness, loss of consciousness and headaches.  Endo/Heme/Allergies:  Negative for environmental allergies.  Psychiatric/Behavioral:  Negative for depression. The patient is not nervous/anxious.       Objective:     BP 108/88 (BP Location: Right Arm, Patient Position: Sitting, Cuff Size: Normal)   Pulse 90   Temp 98.9 F (37.2 C) (Oral)   Resp 18   Ht 5\' 3"  (1.6 m)   Wt 194 lb 9.6 oz (88.3 kg)   SpO2 98%   BMI 34.47 kg/m  BP Readings from Last 3 Encounters:  08/21/23 108/88  08/14/23 100/66  05/21/23 109/75   Wt Readings from Last 3 Encounters:  08/21/23 194 lb 9.6 oz (88.3 kg)  08/14/23 196 lb (88.9 kg)  05/21/23  200 lb 8 oz (90.9 kg)   SpO2 Readings from Last 3 Encounters:  08/21/23 98%  08/14/23 98%  05/21/23 99%      Physical Exam Vitals and nursing note reviewed.  Constitutional:      General: She is not in acute distress.    Appearance: Normal appearance. She is well-developed.  HENT:     Head: Normocephalic and atraumatic.     Nose:     Right Sinus: Maxillary sinus tenderness and frontal sinus tenderness present.     Left Sinus: Maxillary sinus tenderness and frontal sinus tenderness present.  Eyes:     General: No scleral  icterus.       Right eye: No discharge.        Left eye: No discharge.  Cardiovascular:     Rate and Rhythm: Normal rate and regular rhythm.     Heart sounds: No murmur heard. Pulmonary:     Effort: Pulmonary effort is normal. No respiratory distress.     Breath sounds: Normal breath sounds.  Musculoskeletal:        General: Normal range of motion.     Cervical back: Normal range of motion and neck supple.     Right lower leg: No edema.     Left lower leg: No edema.  Skin:    General: Skin is warm and dry.  Neurological:     Mental Status: She is alert and oriented to person, place, and time.  Psychiatric:        Mood and Affect: Mood normal.        Behavior: Behavior normal.        Thought Content: Thought content normal.        Judgment: Judgment normal.      No results found for any visits on 08/21/23.  Last CBC Lab Results  Component Value Date   WBC 9.7 05/21/2023   HGB 13.6 05/21/2023   HCT 41.5 05/21/2023   MCV 91.5 05/21/2023   MCH 29.5 11/16/2022   RDW 13.8 05/21/2023   PLT 359.0 05/21/2023   Last metabolic panel Lab Results  Component Value Date   GLUCOSE 117 (H) 05/25/2023   NA 139 05/25/2023   K 3.9 05/25/2023   CL 104 05/25/2023   CO2 28 05/25/2023   BUN 14 05/25/2023   CREATININE 0.80 05/25/2023   GFR 83.45 05/25/2023   CALCIUM 9.1 05/25/2023   PROT 7.3 05/21/2023   ALBUMIN 4.2 05/21/2023   LABGLOB 3.1 09/25/2022    AGRATIO 1.4 09/25/2022   BILITOT 0.3 05/21/2023   ALKPHOS 76 05/21/2023   AST 15 05/21/2023   ALT 14 05/21/2023   ANIONGAP 7 11/16/2022   Last lipids Lab Results  Component Value Date   CHOL 230 (H) 02/12/2023   HDL 73.90 02/12/2023   LDLCALC 134 (H) 02/12/2023   TRIG 108.0 02/12/2023   CHOLHDL 3 02/12/2023   Last hemoglobin A1c Lab Results  Component Value Date   HGBA1C 5.9 02/12/2023   Last thyroid functions Lab Results  Component Value Date   TSH 0.88 02/12/2023   T3TOTAL 167 02/02/2021   Last vitamin D Lab Results  Component Value Date   VD25OH 50.7 09/25/2022   Last vitamin B12 and Folate Lab Results  Component Value Date   VITAMINB12 1,150 (H) 02/12/2023   FOLATE 6.0 02/02/2021      The 10-year ASCVD risk score (Arnett DK, et al., 2019) is: 1.3%    Assessment & Plan:   Problem List Items Addressed This Visit       Unprioritized   Sinusitis - Primary   Relevant Medications   promethazine (PHENERGAN) 12.5 MG tablet   promethazine-dextromethorphan (PROMETHAZINE-DM) 6.25-15 MG/5ML syrup   Acute cough   Relevant Medications   promethazine-dextromethorphan (PROMETHAZINE-DM) 6.25-15 MG/5ML syrup   Other Visit Diagnoses       Nausea       Relevant Medications   promethazine (PHENERGAN) 12.5 MG tablet     Vaginal discharge         Assessment and Plan    Upper respiratory infection   She presents with symptoms of thick, yellowish-green sputum, hoarseness, fever, myalgia,  and pharyngitis persisting for one week. Initial COVID-19 and influenza tests were negative, though testing may have been premature, or it could be a viral etiology. The pharynx was previously erythematous with left lymphadenopathy. Azithromycin may have been insufficient; Augmentin is considered a stronger option for sinus and pulmonary infections. Prescribe Augmentin for 10 days. Recommend guaifenesin to thin mucus and advise increased hydration. Suggest keeping OTC flu and  COVID-19 tests at home for future use.  Yeast infection   Likely secondary to antibiotic use (azithromycin). Terazol has been effective previously. Prescribe Terazol 3-day treatment and advise starting it if symptoms develop during the antibiotic course.  Migraine headaches   She experiences headaches, particularly over the sinuses, and is due for Botox injections for migraine management.  Nausea   She experiences frequent nausea, managed with Phenergan as needed, taking half a pill to minimize drowsiness. Refill Phenergan prescription.  Weight management   She inquires about new weight loss medications, expressing difficulty in losing weight and noting weight gain since age 74. Discussed high cost and lack of insurance coverage for current medications, with cash pay prices at $399 for the first dose and $499 for the second dose, which is prohibitive. Advise waiting for potential price reduction or insurance coverage.        Return if symptoms worsen or fail to improve.    Donato Schultz, DO

## 2023-08-22 ENCOUNTER — Telehealth: Payer: Self-pay

## 2023-08-22 ENCOUNTER — Other Ambulatory Visit (HOSPITAL_COMMUNITY): Payer: Self-pay

## 2023-08-22 ENCOUNTER — Telehealth: Payer: Self-pay | Admitting: Neurology

## 2023-08-22 ENCOUNTER — Ambulatory Visit (INDEPENDENT_AMBULATORY_CARE_PROVIDER_SITE_OTHER): Payer: BC Managed Care – PPO | Admitting: Neurology

## 2023-08-22 DIAGNOSIS — G43709 Chronic migraine without aura, not intractable, without status migrainosus: Secondary | ICD-10-CM

## 2023-08-22 MED ORDER — ONABOTULINUMTOXINA 200 UNITS IJ SOLR
155.0000 [IU] | Freq: Once | INTRAMUSCULAR | Status: AC
  Administered 2023-08-22: 155 [IU] via INTRAMUSCULAR

## 2023-08-22 MED ORDER — NURTEC 75 MG PO TBDP: 1.0000 | ORAL_TABLET | ORAL | Status: DC

## 2023-08-22 NOTE — Telephone Encounter (Signed)
 I ordered Nurtec for patient. She reports [facility] told her it was $ 1200 through [facility]. May have Medicaid? Can we see if PA is required to obtain Nurtec.

## 2023-08-22 NOTE — Telephone Encounter (Signed)
 Pharmacy Patient Advocate Encounter   Received notification from Physician's Office that prior authorization for Nurtec 75MG  dispersible tablets is required/requested.   Insurance verification completed.   The patient is insured through [facility] .   Per test claim: PA required; PA submitted to above mentioned insurance via CoverMyMeds Key/confirmation #/EOC [id number] Status is pending

## 2023-08-22 NOTE — Progress Notes (Signed)
 Botox- 200 units x 1 vial Lot: [id number] Expiration: 09/2025 NDC: 4010-2725-36  Bacteriostatic 0.9% Sodium Chloride- 4 mL  Lot: [id number] Expiration: 04/05/2024 NDC: 7425-9563-87  Dx: F64.332 B/B Witnessed by Margie Ege NP

## 2023-08-22 NOTE — Addendum Note (Signed)
 Addended by: Lenn Cal on: 08/22/2023 10:03 AM   Modules accepted: Orders

## 2023-08-22 NOTE — Progress Notes (Signed)
   BOTOX PROCEDURE NOTE FOR MIGRAINE HEADACHE  HISTORY: Morgan Long is here for Botox for chronic migraines.  Last was 05/17/2023 with me.  I saw her last week for revisit, we added Nurtec every other day for migraine preventative.   Description of procedure:  The patient was placed in a sitting position. The standard protocol was used for Botox as follows, with 5 units of Botox injected at each site:   -Procerus muscle, midline injection  -Corrugator muscle, bilateral injection  -Frontalis muscle, bilateral injection, with 2 sites each side, medial injection was performed in the upper one third of the frontalis muscle, in the region vertical from the medial inferior edge of the superior orbital rim. The lateral injection was again in the upper one third of the forehead vertically above the lateral limbus of the cornea, 1.5 cm lateral to the medial injection site.  -Temporalis muscle injection, 4 sites, bilaterally. The first injection was 3 cm above the tragus of the ear, second injection site was 1.5 cm to 3 cm up from the first injection site in line with the tragus of the ear. The third injection site was 1.5-3 cm forward between the first 2 injection sites. The fourth injection site was 1.5 cm posterior to the second injection site.  -Occipitalis muscle injection, 3 sites, bilaterally. The first injection was done one half way between the occipital protuberance and the tip of the mastoid process behind the ear. The second injection site was done lateral and superior to the first, 1 fingerbreadth from the first injection. The third injection site was 1 fingerbreadth superiorly and medially from the first injection site.  -Cervical paraspinal muscle injection, 2 sites, bilateral, the first injection site was 1 cm from the midline of the cervical spine, 3 cm inferior to the lower border of the occipital protuberance. The second injection site was 1.5 cm superiorly and laterally to the first  injection site.  -Trapezius muscle injection was performed at 3 sites, bilaterally. The first injection site was in the upper trapezius muscle halfway between the inflection point of the neck, and the acromion. The second injection site was one half way between the acromion and the first injection site. The third injection was done between the first injection site and the inflection point of the neck.   A 200 unit bottle of Botox was used, 155 units were injected, the rest of the Botox was wasted. The patient tolerated the procedure well, there were no complications of the above procedure.  Botox NDC 6063-0160-10 Lot number [id number] Expiration date 09/2025 BB  We gave her sample of Nurtec today.  I sent a message to the nursing team to check to see if Nurtec requires a prior authorization.

## 2023-08-22 NOTE — Telephone Encounter (Signed)
 Noted.

## 2023-08-23 NOTE — Telephone Encounter (Signed)
 Pharmacy Patient Advocate Encounter  Received notification from [facility] that Prior Authorization for Nurtec has been DENIED.  Full denial letter will be uploaded to the media tab. See denial reason below.   PA #/Case ID/Reference #: [id number]

## 2023-08-23 NOTE — Telephone Encounter (Signed)
 Call to [employer], peer to peer set up for 11 am for peer to peer with Tsering Leaman J, RN

## 2023-08-23 NOTE — Telephone Encounter (Signed)
 [employer] [facility] Prior authorization was denied, [phone] option 3, option 1

## 2023-08-27 MED ORDER — NURTEC 75 MG PO TBDP: 75.0000 mg | ORAL_TABLET | ORAL | 11 refills | Status: DC | PRN

## 2023-08-27 NOTE — Addendum Note (Signed)
 Addended by: Glean Salvo on: 08/27/2023 03:45 PM   Modules accepted: Orders

## 2023-08-27 NOTE — Telephone Encounter (Signed)
 Denial for Nurtec noted # 16 tablets for prevention, I am going to resend for # 8 tablets Nurtec to be used as needed. If she continues to have breakthrough migraines with Botox, will add CGRP injection. Her insurance will not pay for Nurtec# 16 tablets + Botox. Thanks. I sent patient my chart note  Meds ordered this encounter  Medications   Rimegepant Sulfate (NURTEC) 75 MG TBDP    Sig: Take 1 tablet (75 mg total) by mouth as needed. Take 1 tablet at onset of headache, max is 1 tablet in 24 hours.    Dispense:  8 tablet    Refill:  11

## 2023-08-28 ENCOUNTER — Other Ambulatory Visit (HOSPITAL_COMMUNITY): Payer: Self-pay

## 2023-08-28 ENCOUNTER — Telehealth: Payer: Self-pay

## 2023-08-28 NOTE — Telephone Encounter (Signed)
 Morrie Sheldon from [employer] [place] has called with approval of Nurtec from 08-28-23 to 11-20-23 if there are questions the # to call is [phone] option 3 then option1

## 2023-08-28 NOTE — Telephone Encounter (Signed)
 Pa for nurtec 8 tabs needed

## 2023-08-28 NOTE — Telephone Encounter (Signed)
 Pharmacy Patient Advocate Encounter   Received notification from Physician's Office that prior authorization for Nurtec 75MG  dispersible tablets is required/requested.   Insurance verification completed.   The patient is insured through [facility] .   Per test claim: PA required; PA submitted to above mentioned insurance via CoverMyMeds Key/confirmation #/EOC [id number] Status is pending

## 2023-08-30 DIAGNOSIS — H0102B Squamous blepharitis left eye, upper and lower eyelids: Secondary | ICD-10-CM | POA: Diagnosis not present

## 2023-08-30 DIAGNOSIS — H0102A Squamous blepharitis right eye, upper and lower eyelids: Secondary | ICD-10-CM | POA: Diagnosis not present

## 2023-09-03 ENCOUNTER — Encounter: Payer: Self-pay | Admitting: Family Medicine

## 2023-09-04 ENCOUNTER — Other Ambulatory Visit: Payer: Self-pay

## 2023-09-04 MED ORDER — DOXYCYCLINE HYCLATE 100 MG PO TABS: 100.0000 mg | ORAL_TABLET | Freq: Two times a day (BID) | ORAL | 0 refills | Status: DC

## 2023-09-05 DIAGNOSIS — I872 Venous insufficiency (chronic) (peripheral): Secondary | ICD-10-CM | POA: Diagnosis not present

## 2023-09-05 NOTE — Patient Instructions (Addendum)

## 2023-09-05 NOTE — Telephone Encounter (Signed)
 Pt requesting rx for Wegovy or Ozempic to see if Medicaid will cover medication

## 2023-09-06 DIAGNOSIS — M79662 Pain in left lower leg: Secondary | ICD-10-CM | POA: Diagnosis not present

## 2023-09-06 DIAGNOSIS — M79661 Pain in right lower leg: Secondary | ICD-10-CM | POA: Diagnosis not present

## 2023-09-06 DIAGNOSIS — M79604 Pain in right leg: Secondary | ICD-10-CM | POA: Diagnosis not present

## 2023-09-06 DIAGNOSIS — I83893 Varicose veins of bilateral lower extremities with other complications: Secondary | ICD-10-CM | POA: Diagnosis not present

## 2023-09-07 ENCOUNTER — Encounter: Payer: Self-pay | Admitting: Family Medicine

## 2023-09-10 ENCOUNTER — Encounter: Payer: Self-pay | Admitting: Family Medicine

## 2023-09-10 ENCOUNTER — Ambulatory Visit (INDEPENDENT_AMBULATORY_CARE_PROVIDER_SITE_OTHER): Admitting: Family Medicine

## 2023-09-10 VITALS — BP 116/72 | HR 80 | Temp 98.9°F | Resp 18 | Ht 63.0 in | Wt 199.2 lb

## 2023-09-10 DIAGNOSIS — J029 Acute pharyngitis, unspecified: Secondary | ICD-10-CM | POA: Diagnosis not present

## 2023-09-10 DIAGNOSIS — R051 Acute cough: Secondary | ICD-10-CM

## 2023-09-10 DIAGNOSIS — J40 Bronchitis, not specified as acute or chronic: Secondary | ICD-10-CM | POA: Diagnosis not present

## 2023-09-10 DIAGNOSIS — R739 Hyperglycemia, unspecified: Secondary | ICD-10-CM | POA: Diagnosis not present

## 2023-09-10 LAB — POCT RAPID STREP A (OFFICE): Rapid Strep A Screen: NEGATIVE

## 2023-09-10 MED ORDER — PROMETHAZINE-DM 6.25-15 MG/5ML PO SYRP: 5.0000 mL | ORAL_SOLUTION | Freq: Four times a day (QID) | ORAL | 0 refills | Status: DC | PRN

## 2023-09-10 MED ORDER — DOXYCYCLINE HYCLATE 100 MG PO TABS: 100.0000 mg | ORAL_TABLET | Freq: Two times a day (BID) | ORAL | 0 refills | Status: DC

## 2023-09-10 MED ORDER — WEGOVY 0.25 MG/0.5ML ~~LOC~~ SOAJ: 0.2500 mg | SUBCUTANEOUS | 0 refills | Status: DC

## 2023-09-10 NOTE — Progress Notes (Addendum)
 Established Patient Office Visit  Subjective   Patient ID: Morgan Long, female    DOB: [birth year]  Age: 53 y.o. MRN: [id number]  Chief Complaint  Patient presents with   Sore Throat   Follow-up    HPI Discussed the use of AI scribe software for clinical note transcription with the patient, who gave verbal consent to proceed.  History of Present Illness The patient presents with persistent sore throat, hoarseness, and cough.  She has been experiencing a persistent sore throat and hoarseness since March. Initial treatment with a Z-Pak, followed by amoxicillin and tigecycline, provided slight improvement, but symptoms worsened with significant loss of voice and increased throat pain, especially at night.  Her cough produces thick, yellow phlegm, primarily occurring at night and sometimes during the day. She uses cough medicine sparingly and still has some left.  She feels weak and has experienced a light fever, with a recorded temperature of 77F. She feels warm at times, particularly around her eyelids.  She experiences increased shortness of breath, especially when climbing stairs, similar to her previous COVID-19 infection. A chest x-ray was performed about a month ago by a PA named Edward.  She has experienced light diarrhea, which she attributes to Augmentin, and denies any vomiting. She continues to take Zyrtec but has stopped using Flonase. She has not taken Nurtec for her migraines yet, which she has on hand.   Patient Active Problem List   Diagnosis Date Noted   Other hyperlipidemia 10/09/2022   BMI 37.0-37.9, adult Current BMI 37.7 10/09/2022   Acute cough 07/18/2022   Polyphagia 06/15/2022   Right groin mass 03/28/2022   Impacted cerumen of left ear 10/13/2021   Chronic constipation 10/04/2021   At risk for impaired metabolic function 10/04/2021   Allergic conjunctivitis  of both eyes 08/25/2021   B12 deficiency 06/23/2021   Prediabetes 06/23/2021   Vitamin D deficiency 06/23/2021   Hematoma 05/25/2021   Changing skin lesion 05/13/2021   Frequent PVCs 12/30/2020   DOE (dyspnea on exertion) 12/30/2020   Allergic contact dermatitis due to cosmetics 12/10/2020   Newly recognized murmur 12/10/2020   Herpes zoster 11/12/2020   Preventative health care 11/04/2020   Shortness of breath    COVID-19 09/13/2020   Adhesive capsulitis of left shoulder 08/04/2020   Pain in joint of left shoulder 08/03/2020   Hepatic adenoma 06/08/2020   Liver lesion 05/04/2020   TMJ pain dysfunction syndrome    Tired    Sinus complaint    SOB (shortness of breath)    Pain in right ankle and joints of right foot    Overweight    Muscle weakness (generalized)    Migraine    Localized edema    Lactose intolerance    History of stomach ulcers    Hepatic steatosis    Heartburn    Fatigue    Dry skin    Dry eye syndrome of both lacrimal glands    Constipation    Chronic migraine    Asthma    History of COVID-19 01/05/2020   Anxiety 05/12/2019   Abnormality of lung on CXR 03/06/2019   Moderate persistent asthma 01/23/2019   Lower extremity edema 01/23/2019   Capsulitis of  ankle, left 09/24/2018   Synovitis of left ankle 09/24/2018   Ankle injury, initial encounter 05/23/2018   Acute pain of left shoulder 03/12/2018   S/P repair of ligament of ankle 03/05/2018   Chronic pain of right ankle 10/28/2017   Other fatigue 08/14/2017   Dyspnea 08/14/2017   Other insomnia 08/14/2017   Medication management 06/12/2017   Nonintractable headache 06/12/2017   Gastroparesis 06/12/2017   Class 2 severe obesity with serious comorbidity and body mass index (BMI) of 35.0 to 35.9 in adult (HCC) 04/25/2017   Leukocytosis 03/01/2017   Generalized obesity 03/01/2017   Chronic migraine without aura without status migrainosus, not intractable 03/01/2017   Sinusitis 07/31/2016    Influenza-like illness 07/12/2016   Peroneal tendinitis of left lower extremity 11/03/2015   Pes anserine bursitis 11/03/2015   Asthma with acute exacerbation 06/15/2015   Acute sinusitis 06/15/2015   GERD (gastroesophageal reflux disease) 01/13/2015   Myalgia and myositis 07/22/2014   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 Clarisse Gouge   Constipation    Dry eye syndrome of both lacrimal glands    Dry skin    Endometriosis    Fatigue    Gastroparesis    GERD (gastroesophageal reflux disease)    Headache(784.0)    occasional, dx w/ Migraines before, Topamax helps   Heartburn    Hepatic steatosis    History of stomach ulcers    Lactose intolerance    Localized edema    Migraine    Muscle weakness (generalized)    Nuclear cataract of both eyes    Mild   Overweight    Pain in right ankle and joints of right foot    Prediabetes    Shortness of breath    Sinus complaint    Sinusitis    Tired    TMJ pain dysfunction syndrome    occasional   Vitamin D deficiency    Past Surgical History:  Procedure Laterality Date   ANKLE SURGERY Left 11/16/2017   AUGMENTATION MAMMAPLASTY Bilateral 2006   BREAST ENHANCEMENT SURGERY  2006   BUNIONECTOMY     COLONOSCOPY     endrometroisis     fallopian tube removed     Left   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   UPPER GASTROINTESTINAL ENDOSCOPY     [facility] teeth extraction     Social History   Tobacco Use   Smoking status: Never     Passive exposure: Never   Smokeless tobacco: Never  Vaping Use   Vaping status: Never Used  Substance Use Topics   Alcohol use: Not Currently    Comment: socially - occasional    Drug use: No   Social History   Socioeconomic History   Marital status: Single    Spouse name: Not on file   Number of children: 0   Years of education: BS   Highest education level: Master's degree (e.g., MA, MS, MEng, MEd, MSW, MBA)  Occupational History   Occupation: Sports coach, going to school    Employer: [employer]   Occupation: Wellsite geologist   Occupation: Chief Executive Officer  Tobacco Use   Smoking status: Never    Passive exposure: Never   Smokeless tobacco: Never  Vaping Use   Vaping status: Never Used  Substance and Sexual Activity   Alcohol use: Not Currently    Comment: socially - occasional    Drug use: No   Sexual activity: Yes    Birth control/protection: Inserts    Comment: nuvaring  Other Topics Concern   Not on file  Social History Narrative   Household:sister and her 3 kids    Drinks occasional starbucks drink       Social Drivers of Health   Financial Resource Strain: Patient Declined (09/29/2022)   Overall Financial Resource Strain (CARDIA)    Difficulty of Paying Living Expenses: Patient declined  Food Insecurity: Food Insecurity Present (09/29/2022)   Hunger Vital Sign    Worried About Running Out of Food in the Last Year: Patient declined    Ran Out of Food in the Last Year: Sometimes true  Transportation Needs: No Transportation Needs (09/29/2022)   PRAPARE - Administrator, Civil Service (Medical): No    Lack of Transportation (Non-Medical): No  Physical Activity: Insufficiently Active (09/29/2022)   Exercise Vital Sign    Days of Exercise per Week: 2 days    Minutes of Exercise per Session: 50 min  Stress: No Stress Concern Present (09/29/2022)   [employer] of Occupational Health - Occupational Stress Questionnaire    Feeling of  Stress : Only a little  Social Connections: Moderately Integrated (09/29/2022)   Social Connection and Isolation Panel [NHANES]    Frequency of Communication with Friends and Family: More than three times a week    Frequency of Social Gatherings with Friends and Family: Once a week    Attends Religious Services: 1 to 4 times per year    Active Member of [employer] or Organizations: Yes    Attends Banker Meetings: 1 to 4 times per year    Marital Status: Never married  Catering manager Violence: Not on file   Family Status  Relation Name Status   Mother  Deceased at age 86       throat cancer   Father  Deceased at age 59       MI   PGF  (Not Specified)   Mat Aunt  Deceased   Mat Aunt  Deceased   Mat Aunt  Deceased   Mat Aunt  Deceased   Mat Aunt  Alive   Mat Aunt  Alive   Mat Aunt  (Not Specified)   Mat Aunt  (Not Specified)   Mat Aunt  (Not Specified)   Mat Uncle  Deceased       throat   Mat Uncle  Deceased       pancreatic   Mat Uncle  (Not Specified)   Other  (Not Specified)   Other  (Not Specified)   Neg Hx  (Not Specified)  No partnership data on file   Family History  Problem Relation Age of Onset   Throat cancer Mother    Cancer - Other Mother 58       throat - died 6 months   Cancer Mother    Alcoholism Mother    Stroke Father    Heart disease Father    Cancer Father  Colon cancer Paternal Grandfather    Breast cancer Maternal Aunt        breast   Breast cancer Maternal Aunt        breast   Pancreatic cancer Maternal Aunt    Hypertension Maternal Aunt        several family members   Breast cancer Maternal Aunt        total of 5 aunts   Breast cancer Maternal Aunt    Colon cancer Maternal Uncle 44       died 69   Throat cancer Maternal Uncle    Diabetes Other        grandmother   Asthma Other        cousin, maternal   Heart attack Neg Hx    Rectal cancer Neg Hx    Stomach cancer Neg Hx    Allergies  Allergen Reactions    Acetaminophen-Codeine Itching   Hydrocodone Nausea Only   Oxycodone Itching   Tyloxapol Other (See Comments)    Other reaction(s): Unknown   Codeine Itching and Nausea Only      Review of Systems  Constitutional:  Negative for fever and malaise/fatigue.  HENT:  Positive for sore throat. Negative for congestion.   Eyes:  Negative for blurred vision.  Respiratory:  Positive for cough and sputum production. Negative for shortness of breath.   Cardiovascular:  Negative for chest pain, palpitations and leg swelling.  Gastrointestinal:  Negative for vomiting.  Musculoskeletal:  Negative for back pain.  Skin:  Negative for rash.  Neurological:  Negative for loss of consciousness and headaches.      Objective:     BP 116/72 (BP Location: Right Arm, Patient Position: Sitting, Cuff Size: Normal)   Pulse 80   Temp 98.9 F (37.2 C) (Oral)   Resp 18   Ht 5\' 3"  (1.6 m)   Wt 199 lb 3.2 oz (90.4 kg)   SpO2 96%   BMI 35.29 kg/m  BP Readings from Last 3 Encounters:  09/10/23 116/72  08/21/23 108/88  08/14/23 100/66   Wt Readings from Last 3 Encounters:  09/10/23 199 lb 3.2 oz (90.4 kg)  08/21/23 194 lb 9.6 oz (88.3 kg)  08/14/23 196 lb (88.9 kg)   SpO2 Readings from Last 3 Encounters:  09/10/23 96%  08/21/23 98%  08/14/23 98%      Physical Exam Vitals and nursing note reviewed.  Constitutional:      General: She is not in acute distress.    Appearance: Normal appearance. She is well-developed.  HENT:     Head: Normocephalic and atraumatic.     Mouth/Throat:     Pharynx: Posterior oropharyngeal erythema present.  Eyes:     General: No scleral icterus.       Right eye: No discharge.        Left eye: No discharge.  Cardiovascular:     Rate and Rhythm: Normal rate and regular rhythm.     Heart sounds: No murmur heard. Pulmonary:     Effort: Pulmonary effort is normal. No respiratory distress.     Breath sounds: Normal breath sounds. No wheezing or rales.   Musculoskeletal:        General: Normal range of motion.     Cervical back: Normal range of motion and neck supple.     Right lower leg: No edema.     Left lower leg: No edema.  Skin:    General: Skin is warm and dry.  Neurological:  Mental Status: She is alert and oriented to person, place, and time.  Psychiatric:        Mood and Affect: Mood normal.        Behavior: Behavior normal.        Thought Content: Thought content normal.        Judgment: Judgment normal.      Results for orders placed or performed in visit on 09/10/23  POCT rapid strep A  Result Value Ref Range   Rapid Strep A Screen Negative Negative    Last CBC Lab Results  Component Value Date   WBC 9.7 05/21/2023   HGB 13.6 05/21/2023   HCT 41.5 05/21/2023   MCV 91.5 05/21/2023   MCH 29.5 11/16/2022   RDW 13.8 05/21/2023   PLT 359.0 05/21/2023   Last metabolic panel Lab Results  Component Value Date   GLUCOSE 117 (H) 05/25/2023   NA 139 05/25/2023   K 3.9 05/25/2023   CL 104 05/25/2023   CO2 28 05/25/2023   BUN 14 05/25/2023   CREATININE 0.80 05/25/2023   GFR 83.45 05/25/2023   CALCIUM 9.1 05/25/2023   PROT 7.3 05/21/2023   ALBUMIN 4.2 05/21/2023   LABGLOB 3.1 09/25/2022   AGRATIO 1.4 09/25/2022   BILITOT 0.3 05/21/2023   ALKPHOS 76 05/21/2023   AST 15 05/21/2023   ALT 14 05/21/2023   ANIONGAP 7 11/16/2022   Last lipids Lab Results  Component Value Date   CHOL 230 (H) 02/12/2023   HDL 73.90 02/12/2023   LDLCALC 134 (H) 02/12/2023   TRIG 108.0 02/12/2023   CHOLHDL 3 02/12/2023   Last hemoglobin A1c Lab Results  Component Value Date   HGBA1C 5.9 02/12/2023   Last thyroid functions Lab Results  Component Value Date   TSH 0.88 02/12/2023   T3TOTAL 167 02/02/2021   Last vitamin D Lab Results  Component Value Date   VD25OH 50.7 09/25/2022   Last vitamin B12 and Folate Lab Results  Component Value Date   VITAMINB12 1,150 (H) 02/12/2023   FOLATE 6.0 02/02/2021       The 10-year ASCVD risk score (Arnett DK, et al., 2019) is: 1.7%    Assessment & Plan:   Problem List Items Addressed This Visit       Unprioritized   Sore throat   Relevant Orders   POCT rapid strep A (Completed)   Acute cough   Relevant Medications   promethazine-dextromethorphan (PROMETHAZINE-DM) 6.25-15 MG/5ML syrup   doxycycline (VIBRA-TABS) 100 MG tablet   Other Relevant Orders   DG Chest 2 View   COVID-19, Flu A+B and RSV   Other Visit Diagnoses       Bronchitis    -  Primary     Hyperglycemia       Relevant Medications   Semaglutide-Weight Management (WEGOVY) 0.25 MG/0.5ML SOAJ       No follow-ups on file.    Donato Schultz, DO

## 2023-09-10 NOTE — Addendum Note (Signed)
 Addended by: Seabron Spates R on: 09/10/2023 05:16 PM   Modules accepted: Orders

## 2023-09-12 ENCOUNTER — Encounter: Payer: Self-pay | Admitting: Family Medicine

## 2023-09-12 ENCOUNTER — Other Ambulatory Visit (HOSPITAL_COMMUNITY): Payer: Self-pay

## 2023-09-12 LAB — COVID-19, FLU A+B AND RSV
Influenza A, NAA: NOT DETECTED
Influenza B, NAA: NOT DETECTED
RSV, NAA: NOT DETECTED
SARS-CoV-2, NAA: NOT DETECTED

## 2023-09-13 ENCOUNTER — Encounter: Payer: Self-pay | Admitting: Family Medicine

## 2023-09-13 NOTE — Telephone Encounter (Signed)
 Pt states having doxy and a z pack prior. Pt states she didn't pick up the second round of Doxy. Should I send in the z-pack again?

## 2023-09-14 ENCOUNTER — Other Ambulatory Visit: Payer: Self-pay | Admitting: Family Medicine

## 2023-09-14 ENCOUNTER — Telehealth: Payer: Self-pay

## 2023-09-14 MED ORDER — LEVOFLOXACIN 500 MG PO TABS: 500.0000 mg | ORAL_TABLET | Freq: Every day | ORAL | 0 refills | Status: AC

## 2023-09-14 NOTE — Telephone Encounter (Signed)
 Pharmacy Patient Advocate Encounter   Received notification from CoverMyMeds that prior authorization for [facility] 0.25MG /0.5ML auto-injectors is required/requested.   Insurance verification completed.   The patient is insured through [facility] MEDICAID .   Per test claim: PA required; PA submitted to above mentioned insurance via CoverMyMeds Key/confirmation #/EOC [id number] Status is pending

## 2023-09-14 NOTE — Telephone Encounter (Signed)
 Pharmacy Patient Advocate Encounter  Received notification from [facility] MEDICAID that Prior Authorization for [facility] 0.25MG /0.5ML auto-injectors has been DENIED.  See denial reason below. No denial letter attached in CMM. Will attach denial letter to Media tab once received.   PA #/Case ID/Reference #: [id number]

## 2023-09-17 NOTE — Telephone Encounter (Signed)
 Looks like Frederik Jansky was denied this go-round.

## 2023-09-24 DIAGNOSIS — Z0289 Encounter for other administrative examinations: Secondary | ICD-10-CM

## 2023-09-25 ENCOUNTER — Other Ambulatory Visit (HOSPITAL_COMMUNITY): Payer: Self-pay

## 2023-09-26 DIAGNOSIS — M722 Plantar fascial fibromatosis: Secondary | ICD-10-CM | POA: Diagnosis not present

## 2023-10-03 DIAGNOSIS — I83892 Varicose veins of left lower extremities with other complications: Secondary | ICD-10-CM | POA: Diagnosis not present

## 2023-10-05 ENCOUNTER — Encounter: Payer: Self-pay | Admitting: Neurology

## 2023-10-05 ENCOUNTER — Encounter: Payer: Self-pay | Admitting: Family Medicine

## 2023-10-08 ENCOUNTER — Telehealth: Payer: Self-pay

## 2023-10-08 ENCOUNTER — Other Ambulatory Visit (HOSPITAL_COMMUNITY): Payer: Self-pay

## 2023-10-08 ENCOUNTER — Other Ambulatory Visit: Payer: Self-pay | Admitting: Neurology

## 2023-10-08 NOTE — Telephone Encounter (Signed)
 noted

## 2023-10-08 NOTE — Telephone Encounter (Signed)
 Pharmacy Patient Advocate Encounter   Received notification from RX Request Messages that prior authorization for Nurtec 75MG  dispersible tablets is required/requested.   Insurance verification completed.   The patient is insured through [facility] .   Per test claim: The current 0 day co-pay is, $30.  No PA needed at this time. This test claim was processed through [facility]- copay amounts may vary at other pharmacies due to pharmacy/plan contracts, or as the patient moves through the different stages of their insurance plan.      When PT has primary insurance and has medicaid as secondary, if there is a PA approval with primary there will not be a secondary approval thru medicaid-COPAY is $0 for 30DS which is 8 Tablets.

## 2023-10-09 ENCOUNTER — Encounter: Payer: Self-pay | Admitting: Neurology

## 2023-10-11 ENCOUNTER — Encounter: Payer: Self-pay | Admitting: Family Medicine

## 2023-10-11 ENCOUNTER — Other Ambulatory Visit: Payer: Self-pay | Admitting: Family Medicine

## 2023-10-15 ENCOUNTER — Other Ambulatory Visit: Payer: Self-pay | Admitting: Family Medicine

## 2023-10-15 MED ORDER — CLINDAMYCIN PHOSPHATE 1 % EX SOLN: Freq: Two times a day (BID) | CUTANEOUS | 4 refills | Status: AC

## 2023-10-17 ENCOUNTER — Encounter: Payer: Self-pay | Admitting: Neurology

## 2023-10-18 NOTE — Telephone Encounter (Signed)
 Secondary UHC MCD auth#: [id number] (10/17/23-10/16/24)

## 2023-10-22 ENCOUNTER — Other Ambulatory Visit (HOSPITAL_COMMUNITY): Payer: Self-pay

## 2023-10-24 DIAGNOSIS — I83891 Varicose veins of right lower extremities with other complications: Secondary | ICD-10-CM | POA: Diagnosis not present

## 2023-10-25 ENCOUNTER — Other Ambulatory Visit (HOSPITAL_COMMUNITY): Payer: Self-pay

## 2023-10-28 ENCOUNTER — Encounter: Payer: Self-pay | Admitting: Family Medicine

## 2023-10-30 ENCOUNTER — Ambulatory Visit (INDEPENDENT_AMBULATORY_CARE_PROVIDER_SITE_OTHER): Admitting: Family Medicine

## 2023-10-30 DIAGNOSIS — H0102A Squamous blepharitis right eye, upper and lower eyelids: Secondary | ICD-10-CM | POA: Diagnosis not present

## 2023-10-30 DIAGNOSIS — H0102B Squamous blepharitis left eye, upper and lower eyelids: Secondary | ICD-10-CM | POA: Diagnosis not present

## 2023-11-01 ENCOUNTER — Ambulatory Visit (INDEPENDENT_AMBULATORY_CARE_PROVIDER_SITE_OTHER): Admitting: Family Medicine

## 2023-11-01 ENCOUNTER — Encounter (INDEPENDENT_AMBULATORY_CARE_PROVIDER_SITE_OTHER): Payer: Self-pay | Admitting: Family Medicine

## 2023-11-01 VITALS — BP 118/70 | HR 91 | Temp 98.2°F | Ht 63.0 in | Wt 199.0 lb

## 2023-11-01 DIAGNOSIS — R0602 Shortness of breath: Secondary | ICD-10-CM

## 2023-11-01 DIAGNOSIS — E559 Vitamin D deficiency, unspecified: Secondary | ICD-10-CM | POA: Diagnosis not present

## 2023-11-01 DIAGNOSIS — R7303 Prediabetes: Secondary | ICD-10-CM | POA: Diagnosis not present

## 2023-11-01 DIAGNOSIS — Z6835 Body mass index (BMI) 35.0-35.9, adult: Secondary | ICD-10-CM

## 2023-11-01 DIAGNOSIS — R5383 Other fatigue: Secondary | ICD-10-CM

## 2023-11-01 DIAGNOSIS — E538 Deficiency of other specified B group vitamins: Secondary | ICD-10-CM | POA: Diagnosis not present

## 2023-11-01 DIAGNOSIS — E7849 Other hyperlipidemia: Secondary | ICD-10-CM

## 2023-11-01 DIAGNOSIS — E66812 Obesity, class 2: Secondary | ICD-10-CM

## 2023-11-01 NOTE — Assessment & Plan Note (Signed)
 LDL elevated in September of 2024.  No repeat lab since then.  Needs repeat FLP today.

## 2023-11-01 NOTE — Assessment & Plan Note (Signed)
 On B12 injections monthly.  Managed by PCP.  Will order level today.

## 2023-11-01 NOTE — Assessment & Plan Note (Signed)
 Historical diagnosis.  She was previously on Vitamin D .  She still has fatigue.  Vitamin D  level ordered for today.

## 2023-11-01 NOTE — Progress Notes (Signed)
 Chief Complaint:  Obesity   Subjective:  Morgan Long (MR# [id number]) is a 53 y.o. female who presents for evaluation and treatment of obesity and related comorbidities.   Morgan Long is currently in the action stage of change and ready to dedicate time achieving and maintaining a healthier weight. Morgan Long is interested in becoming our patient and working on intensive lifestyle modifications including (but not limited to) diet and exercise for weight loss.  She is a former patient.  She mentions that she is still struggling with nighttime eating.  She has not been here for a year.  She has sustained another ankle injury.  She also has had a recent move.    Works as a Child psychotherapist.  She mentions her hours vary on a daily basis.  She works various hours and there is not set hours.  She is single and lives at home with sister, 2 nephews and niece.  She mentions this is stressful as they often eat the more healthy food patient brings into the house.  She anticipates this to be a sabotage for her as available nutritious food will not always be available. Desired weight is 165lbs- last time she was that weight was 2.5-3 years ago.  Skips breakfast and lunch almost daily- she is busy and always on the go.   Food Recall: [employer] in the am- coffee frappicino with shot of espresso possibly with egg whites.  Doesn't drink much water during the day.  Occasionally she may grab something to eat at lunch like cheese sticks or baked chicken.  She eats at night mostly like steak, salad, real fruit bars.   Morgan Long has been struggling with her weight. She has been unsuccessful in either losing weight, maintaining weight loss, or reaching her healthy weight goal.  Indirect Calorimeter completed today shows a RMR: 1440.  Her calculated basal metabolic rate is 2952 thus her basal metabolic rate is worse than expected.  Other Fatigue Morgan Long admits to daytime somnolence and admits to waking up  still tired. Patient has a history of symptoms of morning headache. Morgan Long generally gets 5 or 6 hours of sleep per night, and states that she has generally restless sleep. Snoring is not present. Apneic episodes is present. Epworth Sleepiness Score is 3.   Shortness of Breath Morgan Long notes increasing shortness of breath with exercising and seems to be worsening over time with weight gain. She notes getting out of breath sooner with activity than she used to. This has not gotten worse recently. Morgan Long denies shortness of breath at rest or orthopnea.  Depression Screen Morgan Long Food and Mood (modified PHQ-9) score was 9.     11/01/2023    8:55 AM  Depression screen PHQ 2/9  Decreased Interest 1  Down, Depressed, Hopeless 0  PHQ - 2 Score 1  Altered sleeping 2  Tired, decreased energy 3  Change in appetite 2  Feeling bad or failure about yourself  0  Trouble concentrating 1  Moving slowly or fidgety/restless 0  Suicidal thoughts 0  PHQ-9 Score 9  Difficult doing work/chores Somewhat difficult     Objective:  Vitals Temp: 98.2 F (36.8 C) BP: 118/70 Pulse Rate: 91 SpO2: 99 %   Anthropometric Measurements Height: 5\' 3"  (1.6 m) Weight: 199 lb (90.3 kg) BMI (Calculated): 35.26 Weight at Last Visit: 206 lb Weight Lost Since Last Visit: 7 Weight Gained Since Last Visit: 0 Starting Weight: 206 lb Total Weight Loss (lbs): 7 lb (3.175 kg)  Body Composition  Body Fat %: 45 % Fat Mass (lbs): 89.8 lbs Muscle Mass (lbs): 104 lbs Total Body Water (lbs): 79.2 lbs Visceral Fat Rating : 12   Other Clinical Data RMR: 1440 Fasting: yes Labs: yes Today's Visit #: 27 Starting Date: 02/02/21 Comments: returning, last OV 10/09/22    EKG: Normal sinus rhythm, rate 88.  General: Cooperative, alert, well developed, in no acute distress. HEENT: Conjunctivae and lids unremarkable. Cardiovascular: Regular rhythm.  Lungs: Normal work of breathing. Neurologic: No focal deficits.   Lab  Results  Component Value Date   CREATININE 0.80 05/25/2023   BUN 14 05/25/2023   NA 139 05/25/2023   K 3.9 05/25/2023   CL 104 05/25/2023   CO2 28 05/25/2023   Lab Results  Component Value Date   ALT 14 05/21/2023   AST 15 05/21/2023   ALKPHOS 76 05/21/2023   BILITOT 0.3 05/21/2023   Lab Results  Component Value Date   HGBA1C 5.9 02/12/2023   HGBA1C 5.6 09/25/2022   HGBA1C 5.8 (H) 02/16/2022   HGBA1C 5.9 (H) 07/07/2021   HGBA1C 5.9 (H) 02/02/2021   Lab Results  Component Value Date   INSULIN  8.8 09/25/2022   INSULIN  22.3 02/16/2022   INSULIN  14.2 07/07/2021   INSULIN  12.8 02/02/2021   INSULIN  11.2 05/06/2018   Lab Results  Component Value Date   TSH 0.88 02/12/2023   Lab Results  Component Value Date   CHOL 230 (H) 02/12/2023   HDL 73.90 02/12/2023   LDLCALC 134 (H) 02/12/2023   TRIG 108.0 02/12/2023   CHOLHDL 3 02/12/2023   Lab Results  Component Value Date   WBC 9.7 05/21/2023   HGB 13.6 05/21/2023   HCT 41.5 05/21/2023   MCV 91.5 05/21/2023   PLT 359.0 05/21/2023   Lab Results  Component Value Date   FERRITIN 169 05/21/2019    Assessment and Plan:   Other Fatigue  Morgan Long does feel that her weight is causing her energy to be lower than it should be. Fatigue may be related to obesity, depression or many other causes. Labs will be ordered, and in the meanwhile, Morgan Long will focus on self care including making healthy food choices, increasing physical activity and focusing on stress reduction.  Shortness of Breath  Morgan Long does feel that she gets out of breath more easily that she used to when she exercises. Morgan Long's shortness of breath appears to be obesity related and exercise induced. She has agreed to work on weight loss and gradually increase exercise to treat her exercise induced shortness of breath. Will continue to monitor closely.   Problem List Items Addressed This Visit       Other   Class 2 severe obesity with serious comorbidity and body mass  index (BMI) of 35.0 to 35.9 in adult [facility])   Other fatigue - Primary   Relevant Orders   Vitamin B12   T4, free   T3   CBC with Differential/Platelet   Comprehensive metabolic panel with GFR   Folate   TSH   B12 deficiency   On B12 injections monthly.  Managed by PCP.  Will order level today.      Prediabetes   Last A1c of 5.9 in September of 2024.  Previously on semaglutide  but was ill on Wegovy  not on Ozempic .  Needs a repeat A1c and Insulin  level today.       Relevant Orders   Hemoglobin A1c   Insulin , random   Vitamin D  deficiency  Historical diagnosis.  She was previously on Vitamin D .  She still has fatigue.  Vitamin D  level ordered for today.      Relevant Orders   VITAMIN D  25 Hydroxy (Vit-D Deficiency, Fractures)   Other hyperlipidemia   LDL elevated in September of 2024.  No repeat lab since then.  Needs repeat FLP today.      Relevant Orders   Lipid Panel With LDL/HDL Ratio   Other Visit Diagnoses       SOBOE (shortness of breath on exertion)       Relevant Orders   Comprehensive metabolic panel with GFR   Folate     BMI 35.0-35.9,adult           Sybol is currently in the action stage of change and her goal is to continue with weight loss efforts. I recommend Jeannette begin the structured treatment plan as follows:  She has agreed to Category 1 Plan +100 or 1100 calories and 80 or more grams of protein daily.  Exercise goals: For substantial health benefits, adults should do at least 150 minutes (2 hours and 30 minutes) a week of moderate-intensity, or 75 minutes (1 hour and 15 minutes) a week of vigorous-intensity aerobic physical activity, or an equivalent combination of moderate- and vigorous-intensity aerobic activity. Aerobic activity should be performed in episodes of at least 10 minutes, and preferably, it should be spread throughout the week.  Behavioral modification strategies:increasing lean protein intake, decreasing simple carbohydrates,  increasing vegetables, meal planning and cooking strategies, and keeping healthy foods in the home  She was informed of the importance of frequent follow-up visits to maximize her success with intensive lifestyle modifications for her multiple health conditions. She was informed we would discuss her lab results at her next visit unless there is a critical issue that needs to be addressed sooner. Nyellie agreed to keep her next visit at the agreed upon time to discuss these results.  Labs ordered with plans to discuss at the next visit.   Attestation Statements:  Reviewed by clinician on day of visit: allergies, medications, problem list, medical history, surgical history, family history, social history, and previous encounter notes.  This is the patient's first visit at [facility]. The patient's NEW PATIENT PACKET was reviewed at length. Included in the packet: current and past health history, medications, allergies, ROS, gynecologic history (women only), surgical history, family history, social history, weight history, weight loss surgery history (for those that have had weight loss surgery), nutritional evaluation, mood and food questionnaire, PHQ9, Epworth questionnaire, sleep habits questionnaire, patient life and health improvement goals questionnaire. These will all be scanned into the patient's chart under media.   During the visit, I independently reviewed the patient's EKG, bioimpedance scale results, and indirect calorimeter results. I used this information to tailor a meal plan for the patient that will help her to lose weight and will improve her obesity-related conditions going forward. I performed a medically necessary appropriate examination and/or evaluation. I discussed the assessment and treatment plan with the patient. The patient was provided an opportunity to ask questions and all were answered. The patient agreed with the plan and demonstrated an understanding of the  instructions. Labs were ordered at this visit and will be reviewed at the next visit unless more critical results need to be addressed immediately. Clinical information was updated and documented in the EMR.     Donaciano Frizzle, MD

## 2023-11-01 NOTE — Assessment & Plan Note (Addendum)
 Last A1c of 5.9 in September of 2024.  Previously on semaglutide  but was ill on Wegovy  not on Ozempic .  Needs a repeat A1c and Insulin  level today.

## 2023-11-02 LAB — COMPREHENSIVE METABOLIC PANEL WITH GFR
ALT: 17 IU/L (ref 0–32)
AST: 18 IU/L (ref 0–40)
Albumin: 4 g/dL (ref 3.8–4.9)
Alkaline Phosphatase: 91 IU/L (ref 44–121)
BUN/Creatinine Ratio: 20 (ref 9–23)
BUN: 17 mg/dL (ref 6–24)
Bilirubin Total: <0.2 mg/dL (ref 0.0–1.2)
CO2: 19 mmol/L — ABNORMAL LOW (ref 20–29)
Calcium: 8.9 mg/dL (ref 8.7–10.2)
Chloride: 104 mmol/L (ref 96–106)
Creatinine, Ser: 0.87 mg/dL (ref 0.57–1.00)
Globulin, Total: 3 g/dL (ref 1.5–4.5)
Glucose: 85 mg/dL (ref 70–99)
Potassium: 4.6 mmol/L (ref 3.5–5.2)
Sodium: 139 mmol/L (ref 134–144)
Total Protein: 7 g/dL (ref 6.0–8.5)
eGFR: 79 mL/min/{1.73_m2} (ref >59)

## 2023-11-02 LAB — CBC WITH DIFFERENTIAL/PLATELET
Basophils Absolute: 0.1 10*3/uL (ref 0.0–0.2)
Basos: 1 %
EOS (ABSOLUTE): 0 10*3/uL (ref 0.0–0.4)
Eos: 1 %
Hematocrit: 41.6 % (ref 34.0–46.6)
Hemoglobin: 13.2 g/dL (ref 11.1–15.9)
Immature Grans (Abs): 0 10*3/uL (ref 0.0–0.1)
Immature Granulocytes: 0 %
Lymphocytes Absolute: 2.1 10*3/uL (ref 0.7–3.1)
Lymphs: 25 %
MCH: 29.9 pg (ref 26.6–33.0)
MCHC: 31.7 g/dL (ref 31.5–35.7)
MCV: 94 fL (ref 79–97)
Monocytes Absolute: 0.6 10*3/uL (ref 0.1–0.9)
Monocytes: 7 %
Neutrophils Absolute: 5.6 10*3/uL (ref 1.4–7.0)
Neutrophils: 66 %
Platelets: 352 10*3/uL (ref 150–450)
RBC: 4.41 x10E6/uL (ref 3.77–5.28)
RDW: 12.2 % (ref 11.7–15.4)
WBC: 8.4 10*3/uL (ref 3.4–10.8)

## 2023-11-02 LAB — VITAMIN D 25 HYDROXY (VIT D DEFICIENCY, FRACTURES): Vit D, 25-Hydroxy: 42.6 ng/mL (ref 30.0–100.0)

## 2023-11-02 LAB — T3: T3, Total: 183 ng/dL — ABNORMAL HIGH (ref 71–180)

## 2023-11-02 LAB — LIPID PANEL WITH LDL/HDL RATIO
Cholesterol, Total: 203 mg/dL — ABNORMAL HIGH (ref 100–199)
HDL: 76 mg/dL (ref >39)
LDL Chol Calc (NIH): 108 mg/dL — ABNORMAL HIGH (ref 0–99)
LDL/HDL Ratio: 1.4 ratio (ref 0.0–3.2)
Triglycerides: 111 mg/dL (ref 0–149)
VLDL Cholesterol Cal: 19 mg/dL (ref 5–40)

## 2023-11-02 LAB — VITAMIN B12: Vitamin B-12: 641 pg/mL (ref 232–1245)

## 2023-11-02 LAB — HEMOGLOBIN A1C
Est. average glucose Bld gHb Est-mCnc: 108 mg/dL
Hgb A1c MFr Bld: 5.4 % (ref 4.8–5.6)

## 2023-11-02 LAB — T4, FREE: Free T4: 1.01 ng/dL (ref 0.82–1.77)

## 2023-11-02 LAB — INSULIN, RANDOM: INSULIN: 15.2 u[IU]/mL (ref 2.6–24.9)

## 2023-11-02 LAB — TSH: TSH: 1.8 u[IU]/mL (ref 0.450–4.500)

## 2023-11-02 LAB — FOLATE: Folate: 7.9 ng/mL (ref >3.0)

## 2023-11-04 ENCOUNTER — Encounter: Payer: Self-pay | Admitting: Neurology

## 2023-11-06 ENCOUNTER — Ambulatory Visit (INDEPENDENT_AMBULATORY_CARE_PROVIDER_SITE_OTHER): Admitting: Family Medicine

## 2023-11-06 ENCOUNTER — Encounter: Payer: Self-pay | Admitting: Family Medicine

## 2023-11-08 ENCOUNTER — Telehealth (INDEPENDENT_AMBULATORY_CARE_PROVIDER_SITE_OTHER): Admitting: Neurology

## 2023-11-08 DIAGNOSIS — G43709 Chronic migraine without aura, not intractable, without status migrainosus: Secondary | ICD-10-CM | POA: Diagnosis not present

## 2023-11-08 MED ORDER — AJOVY 225 MG/1.5ML ~~LOC~~ SOAJ: 225.0000 mg | SUBCUTANEOUS | 11 refills | Status: DC

## 2023-11-08 NOTE — Progress Notes (Signed)
 Patient: Morgan Long Date of Birth: [birth year]  Reason for Visit: Follow up History from: Patient Primary Neurologist: Omar Bibber  Virtual Visit via Video Note  I connected with Tamsyn A Dierolf on 11/08/23 at 8:15 AM by a video enabled telemedicine application and verified that I am speaking with the correct person using two identifiers.  Location: Patient: at her home  Provider: in the office    I discussed the limitations of evaluation and management by telemedicine and the availability of in person appointments. The patient expressed understanding and agreed to proceed.  ASSESSMENT AND PLAN 53 y.o. year old female   1.  Chronic migraine headache  -Continue Botox  every 3 months for migraine prevention.  Has had excellent benefit.  Does have some breakthrough headaches, especially about 3 weeks before next Botox , and fir the 1st 2 weeks of Botox  -Add on Ajovy  225 mg monthly injection for migraine prevention -Continue Nurtec 75 mg tablet as needed for acute migraine, insurance denied # 16 tablets since on Botox . At onset of severe headache, add in Aleve , even tizanidine   -Previously tried and failed: Topamax, Zonegran , nortriptyline, did not try Depakote due to side effect worry; Maxalt , Imitrex, [name]   -Next steps: Add on Qulipta,  Emgality, Relpax , Amerge  -Follow-up next week for Botox   Meds ordered this encounter  Medications   Fremanezumab -vfrm (AJOVY ) 225 MG/1.5ML SOAJ    Sig: Inject 225 mg into the skin every 30 (thirty) days.    Dispense:  1.68 mL    Refill:  11   HISTORY OF PRESENT ILLNESS: Today 11/08/23 Update 11/08/23 SS: Has Botox  every 3 months, last 08/22/2023.  Insurance would not approve # 16 tablets of Nurtec. Only able to get 8 tablets a month. Having more migraines, more migraines with Botox  wearing off, 2 weeks before injection, lately more intense, lasting longer. Recent dental work causing? During the 1st 2 weeks of Botox  may have 1-2 migraines, afterwards  will go weeks without migraine with Botox .   Update 08/15/23 SS: Here to discuss migraines. Last had Botox  05/17/23 with me. Came to office for migraine cocktail 07/19/23 (Depacon, Compazine , Toradol ). Due next week for Botox . Having more migraines, having some congestion. Had upper endoscopy last week gave her migraine, they gave her Toradol . The last 2 weeks daily migraine. Once Botox  kicks in, will be migraine free for 1-2 months. Doesn't feel the [name]  is working any longer.   Update 03/14/23 SS: Last saw me 02/20/2023 for Botox . Under a lot of stress, she moved houses. Her sister and her kids are now living with her. Botox  doing well for her. Migraines under good control. Has tizanidine  as needed for muscle tension in her neck and shoulders than can trigger migraines. Has zero migraines until Botox  wears off. Has mild to moderate headache every few weeks. [name]  works well when she takes it. 100 mg works good doesn't have to repeat. Is down 10 lbs, couldn't get the Wegovy  anymore. Migraines are right sided temple, migraine features.   HISTORY  Ms. Renda is a 53 year old right-handed woman with an underlying medical history of allergic rhinitis, endometriosis, TMJ problems, and neck pain, as well as obesity, who presents for evaluation of her headaches. Her headache specialist retired. I last saw her on 09/07/2015 for follow-up for her blurry vision. Her headaches were stable. She had come off of Zonegran  and was started on nortriptyline by Dr. Lamar Pillar.    04/25/17: She reports a long-standing history of migraine headaches, she recalls she was  about 53 years old when she started having migraines. For quite some time she had spontaneous improvement in her migraines but some 4 or 5 years ago started having more frequent migraines. She has tried and failed multiple preventative medications but does not have a list with her and prior records from Dr. Bart Lieu office are not available today. She reports  that his office is closed at this point. She did not get her records from his office. Perhaps her primary care provider has records. I encouraged her to try to obtain those records as it will help us  manage her migraines optimally. She recalls having tried Topamax. She had hair loss from some medications but is not completely sure which ones. Topamax did not help, Zonegran  caused her sedation, nortriptyline was also tried but she is unsure what happened with it. Amitriptyline she may have tried as well. She did not try Depakote. She is worried about sedation side effects as she also is going to school and has to be alert throughout the day. She takes occasional nausea medicine which likely is Zofran  under the tongue. She has tried Maxalt  under the tongue which is helpful at times. She needs a refill on that. She has regular eye exams because of her history of dry eyes. She used to wear a single contact but was advised to stop using it.  She has had 2 rounds of Botox  injections, last time about 3 months ago as she recalls. She remembers doing okay with it. Prior to starting Botox  injections she reports having about 18-20 headache days per month typically. She does not always have a typical aura but sometimes has visual blurriness and bringing in her ears to warn her about an upcoming migraine headache. She has occasional nausea that precedes the headache.  REVIEW OF SYSTEMS: Out of a complete 14 system review of symptoms, the patient complains only of the following symptoms, and all other reviewed systems are negative.  See HPI  ALLERGIES: Allergies  Allergen Reactions   Acetaminophen -Codeine  Itching   Hydrocodone  Nausea Only   Oxycodone  Itching   Tyloxapol Other (See Comments)    Other reaction(s): Unknown   Codeine  Itching and Nausea Only    HOME MEDICATIONS: Outpatient Medications Prior to Visit  Medication Sig Dispense Refill   Semaglutide -Weight Management (WEGOVY ) 1.7 MG/0.75ML SOAJ Inject  1.7 mg into the skin as directed.     Apremilast (OTEZLA) 30 MG TABS Take 1 tablet by mouth 2 (two) times daily.     cetirizine  (ZYRTEC ) 10 MG tablet TAKE 1 TABLET BY MOUTH EVERY DAY 90 tablet 1   clindamycin  (CLEOCIN  T) 1 % external solution Apply topically 2 (two) times daily. 30 mL 4   cyanocobalamin  (VITAMIN B12) 1000 MCG/ML injection Inject 1 mL (1,000 mcg total) into the muscle every 30 (thirty) days. 3 mL 3   etonogestrel-ethinyl estradiol  (NUVARING) 0.12-0.015 MG/24HR vaginal ring Place 1 each vaginally every 28 (twenty-eight) days. Insert vaginally and leave in place for 3 consecutive weeks, then remove for 1 week.     Multiple Vitamin (MULTIVITAMIN) LIQD Take 5 mLs by mouth daily.     NURTEC 75 MG TBDP TAKE 1 TABLET (75 MG TOTAL) BY MOUTH AS NEEDED. TAKE 1 TABLET AT ONSET OF HEADACHE, MAX IS 1 TABLET IN 24 HOURS. 8 tablet 11   promethazine  (PHENERGAN ) 12.5 MG tablet Take 1 tablet (12.5 mg total) by mouth every 6 (six) hours as needed for nausea or vomiting. 30 tablet 2   promethazine -dextromethorphan (PROMETHAZINE -DM) 6.25-15  MG/5ML syrup Take 5 mLs by mouth 4 (four) times daily as needed. 118 mL 0   tiZANidine  (ZANAFLEX ) 4 MG tablet Take 1 tablet (4 mg total) by mouth every 6 (six) hours as needed for muscle spasms (as needed for muscle tension). 20 tablet 1   No facility-administered medications prior to visit.    PAST MEDICAL HISTORY: Past Medical History:  Diagnosis Date   Allergic rhinitis    Asthma    B12 deficiency    Chronic migraine    Dr. Juli Oas   Constipation    Dry eye syndrome of both lacrimal glands    Dry skin    Endometriosis    Fatigue    Gastroparesis    GERD (gastroesophageal reflux disease)    Headache(784.0)    occasional, dx w/ Migraines before, Topamax helps   Heartburn    Hepatic steatosis    History of stomach ulcers    Lactose intolerance    Localized edema    Migraine    Muscle weakness (generalized)    Nuclear cataract of both eyes    Mild    Overweight    Pain in right ankle and joints of right foot    Prediabetes    Shortness of breath    Sinus complaint    Sinusitis    Tired    TMJ pain dysfunction syndrome    occasional   Vitamin D  deficiency     PAST SURGICAL HISTORY: Past Surgical History:  Procedure Laterality Date   ANKLE SURGERY Left 11/16/2017   AUGMENTATION MAMMAPLASTY Bilateral 2006   BREAST ENHANCEMENT SURGERY  2006   BUNIONECTOMY     COLONOSCOPY     endrometroisis     fallopian tube removed     Left   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   UPPER GASTROINTESTINAL ENDOSCOPY     [facility] teeth extraction      FAMILY HISTORY: Family History  Problem Relation Age of Onset   Hyperlipidemia Mother    Hypertension Mother    Diabetes Mother    Throat cancer Mother    Cancer - Other Mother 44       throat - died 6 months   Cancer Mother    Alcoholism Mother    Anxiety disorder Mother    Stroke Father    Heart disease Father    Cancer Father    Colon cancer Paternal Grandfather    Breast cancer Maternal Aunt        breast   Breast cancer Maternal Aunt        breast   Pancreatic cancer Maternal Aunt    Hypertension Maternal Aunt        several family members   Breast cancer Maternal Aunt        total of 5 aunts   Breast cancer Maternal Aunt    Colon cancer Maternal Uncle 44       died 44   Throat cancer Maternal Uncle    Diabetes Other        grandmother   Asthma Other        cousin, maternal   Heart attack Neg Hx    Rectal cancer Neg Hx    Stomach cancer Neg Hx     SOCIAL HISTORY: Social History   Socioeconomic History   Marital status: Single    Spouse name: Not on file   Number of children: 0   Years of education:  BS   Highest education level: Master's degree (e.g., MA, MS, MEng, MEd, MSW, MBA)  Occupational History   Occupation: Sports coach, going to school    Employer: [employer]   Occupation: Wellsite geologist    Occupation: Chief Executive Officer  Tobacco Use   Smoking status: Never    Passive exposure: Never   Smokeless tobacco: Never  Vaping Use   Vaping status: Never Used  Substance and Sexual Activity   Alcohol use: Not Currently    Comment: socially - occasional    Drug use: No   Sexual activity: Yes    Birth control/protection: Inserts    Comment: nuvaring  Other Topics Concern   Not on file  Social History Narrative   Household:sister and her 3 kids    Drinks occasional starbucks drink       Social Drivers of Health   Financial Resource Strain: Patient Declined (09/29/2022)   Overall Financial Resource Strain (CARDIA)    Difficulty of Paying Living Expenses: Patient declined  Food Insecurity: Food Insecurity Present (09/29/2022)   Hunger Vital Sign    Worried About Running Out of Food in the Last Year: Patient declined    Ran Out of Food in the Last Year: Sometimes true  Transportation Needs: No Transportation Needs (09/29/2022)   PRAPARE - Administrator, Civil Service (Medical): No    Lack of Transportation (Non-Medical): No  Physical Activity: Insufficiently Active (09/29/2022)   Exercise Vital Sign    Days of Exercise per Week: 2 days    Minutes of Exercise per Session: 50 min  Stress: No Stress Concern Present (09/29/2022)   [employer] of Occupational Health - Occupational Stress Questionnaire    Feeling of Stress : Only a little  Social Connections: Moderately Integrated (09/29/2022)   Social Connection and Isolation Panel [NHANES]    Frequency of Communication with Friends and Family: More than three times a week    Frequency of Social Gatherings with Friends and Family: Once a week    Attends Religious Services: 1 to 4 times per year    Active Member of [employer] or Organizations: Yes    Attends Banker Meetings: 1 to 4 times per year    Marital Status: Never married  Intimate Partner Violence: Not on file   PHYSICAL EXAM  There were no vitals  filed for this visit.  There is no height or weight on file to calculate BMI.  Via video visit, seated in her car, is alert and oriented, speech is clear and concise, facial symmetry noted, follows commands  DIAGNOSTIC DATA (LABS, IMAGING, TESTING) - I reviewed patient records, labs, notes, testing and imaging myself where available.  Lab Results  Component Value Date   WBC 8.4 11/01/2023   HGB 13.2 11/01/2023   HCT 41.6 11/01/2023   MCV 94 11/01/2023   PLT 352 11/01/2023      Component Value Date/Time   NA 139 11/01/2023 1517   K 4.6 11/01/2023 1517   CL 104 11/01/2023 1517   CO2 19 (L) 11/01/2023 1517   GLUCOSE 85 11/01/2023 1517   GLUCOSE 117 (H) 05/25/2023 1310   BUN 17 11/01/2023 1517   CREATININE 0.87 11/01/2023 1517   CALCIUM 8.9 11/01/2023 1517   PROT 7.0 11/01/2023 1517   ALBUMIN 4.0 11/01/2023 1517   AST 18 11/01/2023 1517   ALT 17 11/01/2023 1517   ALKPHOS 91 11/01/2023 1517   BILITOT <0.2 11/01/2023 1517   GFRNONAA >60 11/16/2022  1859   GFRAA >60 12/15/2019 0733   Lab Results  Component Value Date   CHOL 203 (H) 11/01/2023   HDL 76 11/01/2023   LDLCALC 108 (H) 11/01/2023   TRIG 111 11/01/2023   CHOLHDL 3 02/12/2023   Lab Results  Component Value Date   HGBA1C 5.4 11/01/2023   Lab Results  Component Value Date   VITAMINB12 641 11/01/2023   Lab Results  Component Value Date   TSH 1.800 11/01/2023   Jeanmarie Millet, AGNP-C, DNP 11/08/2023, 8:40 AM [facility] [address] [place], [place] [address] [phone]

## 2023-11-08 NOTE — Patient Instructions (Signed)
 Great to see you today.  We will continue Botox .  Add on Ajovy  monthly injection for migraine prevention.  Continue Nurtec as needed.  I will see you next week for Botox .  Thanks!!

## 2023-11-12 ENCOUNTER — Other Ambulatory Visit (HOSPITAL_COMMUNITY): Payer: Self-pay

## 2023-11-12 ENCOUNTER — Telehealth: Payer: Self-pay

## 2023-11-12 DIAGNOSIS — G43719 Chronic migraine without aura, intractable, without status migrainosus: Secondary | ICD-10-CM

## 2023-11-12 NOTE — Telephone Encounter (Signed)
 Pharmacy Patient Advocate Encounter   Received notification from CoverMyMeds that prior authorization for AJOVY  (fremanezumab -vfrm) injection 225MG /1.5ML auto-injectors is required/requested.   Insurance verification completed.   The patient is insured through [facility] .   Per test claim: PA required; PA submitted to above mentioned insurance via CoverMyMeds Key/confirmation #/EOC [id number] Status is pending

## 2023-11-13 ENCOUNTER — Other Ambulatory Visit: Payer: Self-pay | Admitting: Obstetrics and Gynecology

## 2023-11-13 DIAGNOSIS — Z1231 Encounter for screening mammogram for malignant neoplasm of breast: Secondary | ICD-10-CM

## 2023-11-15 ENCOUNTER — Ambulatory Visit: Admitting: Neurology

## 2023-11-15 VITALS — BP 117/77

## 2023-11-15 DIAGNOSIS — G43719 Chronic migraine without aura, intractable, without status migrainosus: Secondary | ICD-10-CM | POA: Diagnosis not present

## 2023-11-15 DIAGNOSIS — G43709 Chronic migraine without aura, not intractable, without status migrainosus: Secondary | ICD-10-CM

## 2023-11-15 MED ORDER — ONABOTULINUMTOXINA 100 UNITS IJ SOLR
155.0000 [IU] | Freq: Once | INTRAMUSCULAR | Status: AC
  Administered 2023-11-15: 155 [IU] via INTRAMUSCULAR

## 2023-11-15 NOTE — Progress Notes (Addendum)
 Botox - 200 units x 1 vial Lot: [id number] Expiration: 03/2026 NDC: 3086-5784-69  Bacteriostatic 0.9% Sodium Chloride - 4 mL  Lot: [id number] Expiration: 12/02/2024 NDC: 4132-4401-02  Dx: G68.709 B/B Witnessed by April Jones RN and Kenneth Peace CMA student

## 2023-11-15 NOTE — Progress Notes (Signed)
   BOTOX  PROCEDURE NOTE FOR MIGRAINE HEADACHE  HISTORY: Morgan Long is here for Botox . Last was 08/22/23 with me. Doing well with Botox  but has breakthrough as Botox  wears off for the 1st 1-2 weeks after injection. We added on Ajovy  for migraine prevention.   Description of procedure:  The patient was placed in a sitting position. The standard protocol was used for Botox  as follows, with 5 units of Botox  injected at each site:  -Procerus muscle, midline injection  -Corrugator muscle, bilateral injection  -Frontalis muscle, bilateral injection, with 2 sites each side, medial injection was performed in the upper one third of the frontalis muscle, in the region vertical from the medial inferior edge of the superior orbital rim. The lateral injection was again in the upper one third of the forehead vertically above the lateral limbus of the cornea, 1.5 cm lateral to the medial injection site.  -Temporalis muscle injection, 4 sites, bilaterally. The first injection was 3 cm above the tragus of the ear, second injection site was 1.5 cm to 3 cm up from the first injection site in line with the tragus of the ear. The third injection site was 1.5-3 cm forward between the first 2 injection sites. The fourth injection site was 1.5 cm posterior to the second injection site.  -Occipitalis muscle injection, 3 sites, bilaterally. The first injection was done one half way between the occipital protuberance and the tip of the mastoid process behind the ear. The second injection site was done lateral and superior to the first, 1 fingerbreadth from the first injection. The third injection site was 1 fingerbreadth superiorly and medially from the first injection site.  -Cervical paraspinal muscle injection, 2 sites, bilateral, the first injection site was 1 cm from the midline of the cervical spine, 3 cm inferior to the lower border of the occipital protuberance. The second injection site was 1.5 cm superiorly and  laterally to the first injection site.  -Trapezius muscle injection was performed at 3 sites, bilaterally. The first injection site was in the upper trapezius muscle halfway between the inflection point of the neck, and the acromion. The second injection site was one half way between the acromion and the first injection site. The third injection was done between the first injection site and the inflection point of the neck.  A 200 unit bottle of Botox  was used, 155 units were injected, the rest of the Botox  was wasted. The patient tolerated the procedure well, there were no complications of the above procedure.  Botox  NDC 1610-9604-54 Lot number [id number] Expiration date 03/2026 BB

## 2023-11-15 NOTE — Telephone Encounter (Signed)
Pa determination

## 2023-11-16 NOTE — Telephone Encounter (Signed)
 Pharmacy Patient Advocate Encounter  Received notification from [facility] that Prior Authorization for PA Case ID #: [id number] has been DENIED.  No reason given; No denial letter received via Fax or CMM. It has been requested and will be uploaded to the media tab once received.   PA #/Case ID/Reference #: PA Case ID #: [id number]

## 2023-11-21 ENCOUNTER — Other Ambulatory Visit (HOSPITAL_COMMUNITY): Payer: Self-pay

## 2023-11-21 ENCOUNTER — Telehealth: Payer: Self-pay

## 2023-11-21 NOTE — Telephone Encounter (Signed)
   Denial lletter has been placed in chart under media tab.

## 2023-11-21 NOTE — Telephone Encounter (Signed)
 PA request has been Submitted. New Encounter has been or will be created for follow up. For additional info see Pharmacy Prior Auth telephone encounter from 11/21/2023.  Submitted to Medicaid (Secondary)

## 2023-11-21 NOTE — Telephone Encounter (Signed)
 Pharmacy Patient Advocate Encounter   Received notification from Physician's Office that prior authorization for AJOVY  (fremanezumab -vfrm) injection 225MG /1.5ML auto-injectors is required/requested.   Insurance verification completed.   The patient is insured through [facility] MEDICAID .   Per test claim: PA required; PA submitted to above mentioned insurance via CoverMyMeds Key/confirmation #/EOC [id number] Status is pending

## 2023-11-21 NOTE — Telephone Encounter (Signed)
 Called [employer] and verified denied because PT is also using Botox  for migraine prevention-this plan will not allow both to be used even if PT is still having migraines with Botox . I have requested the denial letter again since it failed the first time they sent it.

## 2023-11-22 ENCOUNTER — Other Ambulatory Visit (HOSPITAL_COMMUNITY): Payer: Self-pay

## 2023-11-23 ENCOUNTER — Other Ambulatory Visit (HOSPITAL_COMMUNITY): Payer: Self-pay

## 2023-11-23 NOTE — Telephone Encounter (Signed)
     Unfortunately because the PT doesn't have traditional [place] Medicaid, She has [facility] Medicaid and they will not allow an override when the primary doesn't cover the medication. With traditional medicaid there is an override code they can do. Unfortunately can not use her medicaid to pay as primary for this med.

## 2023-11-26 DIAGNOSIS — I872 Venous insufficiency (chronic) (peripheral): Secondary | ICD-10-CM | POA: Diagnosis not present

## 2023-11-26 DIAGNOSIS — I83893 Varicose veins of bilateral lower extremities with other complications: Secondary | ICD-10-CM | POA: Diagnosis not present

## 2023-11-28 ENCOUNTER — Other Ambulatory Visit: Payer: Self-pay

## 2023-11-28 ENCOUNTER — Encounter (HOSPITAL_BASED_OUTPATIENT_CLINIC_OR_DEPARTMENT_OTHER): Payer: Self-pay | Admitting: Emergency Medicine

## 2023-11-28 ENCOUNTER — Encounter: Payer: Self-pay | Admitting: Family Medicine

## 2023-11-28 ENCOUNTER — Emergency Department (HOSPITAL_BASED_OUTPATIENT_CLINIC_OR_DEPARTMENT_OTHER)
Admission: EM | Admit: 2023-11-28 | Discharge: 2023-11-29 | Discharge status: Against Medical Advice | Attending: Emergency Medicine | Admitting: Emergency Medicine

## 2023-11-28 ENCOUNTER — Emergency Department (HOSPITAL_BASED_OUTPATIENT_CLINIC_OR_DEPARTMENT_OTHER)

## 2023-11-28 DIAGNOSIS — M316 Other giant cell arteritis: Secondary | ICD-10-CM | POA: Diagnosis present

## 2023-11-28 DIAGNOSIS — H538 Other visual disturbances: Secondary | ICD-10-CM | POA: Diagnosis not present

## 2023-11-28 DIAGNOSIS — Z5329 Procedure and treatment not carried out because of patient's decision for other reasons: Secondary | ICD-10-CM | POA: Insufficient documentation

## 2023-11-28 DIAGNOSIS — R9431 Abnormal electrocardiogram [ECG] [EKG]: Secondary | ICD-10-CM | POA: Diagnosis not present

## 2023-11-28 DIAGNOSIS — R519 Headache, unspecified: Secondary | ICD-10-CM | POA: Insufficient documentation

## 2023-11-28 LAB — BASIC METABOLIC PANEL WITH GFR
Anion gap: 11 (ref 5–15)
BUN: 16 mg/dL (ref 6–20)
CO2: 24 mmol/L (ref 22–32)
Calcium: 8.6 mg/dL — ABNORMAL LOW (ref 8.9–10.3)
Chloride: 106 mmol/L (ref 98–111)
Creatinine, Ser: 0.68 mg/dL (ref 0.44–1.00)
GFR, Estimated: >60 mL/min (ref >60)
Glucose, Bld: 82 mg/dL (ref 70–99)
Potassium: 4 mmol/L (ref 3.5–5.1)
Sodium: 141 mmol/L (ref 135–145)

## 2023-11-28 LAB — CBC
HCT: 39.4 % (ref 36.0–46.0)
Hemoglobin: 13.1 g/dL (ref 12.0–15.0)
MCH: 29.7 pg (ref 26.0–34.0)
MCHC: 33.2 g/dL (ref 30.0–36.0)
MCV: 89.3 fL (ref 80.0–100.0)
Platelets: 350 10*3/uL (ref 150–400)
RBC: 4.41 MIL/uL (ref 3.87–5.11)
RDW: 13.1 % (ref 11.5–15.5)
WBC: 8.4 10*3/uL (ref 4.0–10.5)
nRBC: 0 % (ref 0.0–0.2)

## 2023-11-28 LAB — C-REACTIVE PROTEIN: CRP: 1 mg/dL — ABNORMAL HIGH (ref <1.0)

## 2023-11-28 LAB — SEDIMENTATION RATE: Sed Rate: 50 mm/h — ABNORMAL HIGH (ref 0–22)

## 2023-11-28 MED ORDER — SODIUM CHLORIDE 0.9 % IV BOLUS
1000.0000 mL | Freq: Once | INTRAVENOUS | Status: AC
  Administered 2023-11-28: 1000 mL via INTRAVENOUS

## 2023-11-28 MED ORDER — PREDNISONE 50 MG PO TABS
60.0000 mg | ORAL_TABLET | Freq: Once | ORAL | Status: AC
  Administered 2023-11-28: 60 mg via ORAL
  Filled 2023-11-28: qty 1

## 2023-11-28 MED ORDER — METOCLOPRAMIDE HCL 5 MG/ML IJ SOLN
10.0000 mg | Freq: Once | INTRAMUSCULAR | Status: AC
  Administered 2023-11-28: 10 mg via INTRAVENOUS
  Filled 2023-11-28: qty 2

## 2023-11-28 MED ORDER — ZOLMITRIPTAN 5 MG PO TABS: 5.0000 mg | ORAL_TABLET | ORAL | 0 refills | Status: DC | PRN

## 2023-11-28 MED ORDER — DIPHENHYDRAMINE HCL 50 MG/ML IJ SOLN
12.5000 mg | Freq: Once | INTRAMUSCULAR | Status: AC
Start: 2023-11-28 — End: 2023-11-28
  Administered 2023-11-28: 12.5 mg via INTRAVENOUS
  Filled 2023-11-28: qty 1

## 2023-11-28 MED ORDER — DEXAMETHASONE SODIUM PHOSPHATE 10 MG/ML IJ SOLN
10.0000 mg | Freq: Once | INTRAMUSCULAR | Status: AC
  Administered 2023-11-28: 10 mg via INTRAVENOUS
  Filled 2023-11-28: qty 1

## 2023-11-28 MED ORDER — KETOROLAC TROMETHAMINE 15 MG/ML IJ SOLN
15.0000 mg | Freq: Once | INTRAMUSCULAR | Status: AC
  Administered 2023-11-28: 15 mg via INTRAVENOUS
  Filled 2023-11-28: qty 1
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
## 2023-11-28 NOTE — Addendum Note (Signed)
 Addended by: Pernell Lenoir on: 11/28/2023 05:03 PM   Modules accepted: Orders

## 2023-11-28 NOTE — Telephone Encounter (Signed)
 We can try a triptan again. In reviewing the chart, she has tried Maxalt  and Imitrex and Relpax  in the past. I can send a Rx for Zomig 5 mg: take 1 pill early on when you suspect a migraine attack come on. You may take another pill after 2 hours, no more than 2 pills in 24 hours, no more than 3 pills a week.   Pls advise pt: she can take Nurtec OR Zomig, but not both within 24 hours together.  Most people who take triptans do not have any serious side-effects. However, they can cause drowsiness (remember to not drive or use heavy machinery when drowsy), nausea, dizziness, dry mouth. Less common side effects include strange sensations, such as tightness in your chest or throat, tingling, flushing, and feelings of heaviness or pressure in areas such as the face, limbs, and chest. These in the chest can mimic heart related pain (angina) and may cause alarm, but usually these sensations are not harmful or a sign of a heart attack. However, if you develop intense chest pain or sensations of discomfort, you should stop taking your medication and consult with me or your PCP or go to the nearest urgent care facility or ER or call 911.
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
## 2023-11-29 LAB — TROPONIN T, HIGH SENSITIVITY: Troponin T High Sensitivity: <15 ng/L (ref <19)

## 2023-11-29 MED ORDER — KETOROLAC TROMETHAMINE 30 MG/ML IJ SOLN
15.0000 mg | Freq: Once | INTRAMUSCULAR | Status: AC
  Administered 2023-11-29: 15 mg via INTRAVENOUS
  Filled 2023-11-29: qty 1

## 2023-11-29 MED ORDER — PREDNISONE 20 MG PO TABS: 60.0000 mg | ORAL_TABLET | Freq: Every day | ORAL | 0 refills | Status: DC

## 2023-11-29 MED ORDER — PROCHLORPERAZINE EDISYLATE 10 MG/2ML IJ SOLN
10.0000 mg | Freq: Once | INTRAMUSCULAR | Status: AC
  Administered 2023-11-29: 10 mg via INTRAVENOUS
  Filled 2023-11-29: qty 2

## 2023-11-29 MED ORDER — DIPHENHYDRAMINE HCL 50 MG/ML IJ SOLN
25.0000 mg | Freq: Once | INTRAMUSCULAR | Status: AC
  Administered 2023-11-29: 25 mg via INTRAVENOUS
  Filled 2023-11-29: qty 1

## 2023-11-29 NOTE — ED Notes (Signed)
 Pt called this nurse to room about discharge and prescription of prednisone  , explained that patient can leave AMA if she wishes to. We are still waiting on bed availability . ER EDP made aware .  After discussing all options and explained again the plan of care , pt decided to stay and wait for admission .

## 2023-11-29 NOTE — ED Notes (Addendum)
 Pt signed she understood all consequences of leaving ama, she decided she did not want to wait IV d/c and pt left ambulatory aaox4 has 2 rx  to pick up

## 2023-11-29 NOTE — Telephone Encounter (Signed)
 Agree with this approach, thanks.

## 2023-11-29 NOTE — ED Notes (Signed)
 Introduce myself to pt , cleaned room and took out trash ,got vitals and gave pt ginger ale and cranberry juice , no discomfort at this time , gave pt charger for phone

## 2023-12-03 ENCOUNTER — Ambulatory Visit

## 2023-12-05 ENCOUNTER — Ambulatory Visit (INDEPENDENT_AMBULATORY_CARE_PROVIDER_SITE_OTHER): Admitting: Family Medicine

## 2023-12-06 ENCOUNTER — Encounter (INDEPENDENT_AMBULATORY_CARE_PROVIDER_SITE_OTHER): Payer: Self-pay | Admitting: Family Medicine

## 2023-12-06 ENCOUNTER — Telehealth (INDEPENDENT_AMBULATORY_CARE_PROVIDER_SITE_OTHER): Payer: Self-pay

## 2023-12-06 ENCOUNTER — Other Ambulatory Visit (INDEPENDENT_AMBULATORY_CARE_PROVIDER_SITE_OTHER): Payer: Self-pay | Admitting: Family Medicine

## 2023-12-06 ENCOUNTER — Ambulatory Visit (INDEPENDENT_AMBULATORY_CARE_PROVIDER_SITE_OTHER): Admitting: Family Medicine

## 2023-12-06 VITALS — BP 111/67 | HR 85 | Temp 98.3°F | Ht 63.0 in | Wt 200.0 lb

## 2023-12-06 DIAGNOSIS — R7303 Prediabetes: Secondary | ICD-10-CM | POA: Diagnosis not present

## 2023-12-06 DIAGNOSIS — E559 Vitamin D deficiency, unspecified: Secondary | ICD-10-CM | POA: Diagnosis not present

## 2023-12-06 DIAGNOSIS — E7849 Other hyperlipidemia: Secondary | ICD-10-CM | POA: Diagnosis not present

## 2023-12-06 DIAGNOSIS — E66812 Obesity, class 2: Secondary | ICD-10-CM

## 2023-12-06 DIAGNOSIS — Z6835 Body mass index (BMI) 35.0-35.9, adult: Secondary | ICD-10-CM

## 2023-12-06 MED ORDER — ZEPBOUND 2.5 MG/0.5ML ~~LOC~~ SOAJ: 2.5000 mg | SUBCUTANEOUS | 0 refills | Status: DC

## 2023-12-06 NOTE — Assessment & Plan Note (Signed)
 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  Patient to continue to monitor and be in control of total intake of snack calories which may be simple carbohydrates but should be consumed only after the patient has taken in all the nutrition for the day.  Macronutrient identification, classification and daily intake ratios were discussed.  Plan to repeat labs in 3 months to monitor both hemoglobin A1c and insulin  levels.  Patient A1c at first appointment better controlled.

## 2023-12-06 NOTE — Telephone Encounter (Signed)
 PA started

## 2023-12-06 NOTE — Telephone Encounter (Signed)
 PA questions for Zepbound 2.5  have been answered and all documentation has been included. Waiting on a determination.

## 2023-12-06 NOTE — Assessment & Plan Note (Signed)
 The 10-year ASCVD risk score (Arnett DK, et al., 2019) is: 1.5%   Values used to calculate the score:     Age: 53 years     Clincally relevant sex: Female     Is Non-Hispanic African American: Yes     Diabetic: No     Tobacco smoker: No     Systolic Blood Pressure: 111 mmHg     Is BP treated: No     HDL Cholesterol: 76 mg/dL     Total Cholesterol: 203 mg/dL Patient working on changing intake of macros with focus on increasing protein.  Will retest in 3 months.

## 2023-12-06 NOTE — Progress Notes (Signed)
 SUBJECTIVE:  Chief Complaint: Obesity  Interim History: Patient has had more migraines recently and even had to go to the emergency department for her migraine.  She thinks she may need a root canal; evaluation is in 4 days at dentist.  She has been trying to focus on protein and working out more with stretching and resistance.  She hasn't been keeping track of calories and wants to make that a priority in the next few weeks.   Morgan Long is here to discuss her progress with her obesity treatment plan. She is on the Category 1 Plan and keeping a food journal and adhering to recommended goals of 1100 calories and 80 grams of protein and states she is following her eating plan approximately 60 % of the time. She states she is exercising 90 minutes 5 times per week.   OBJECTIVE: Visit Diagnoses: Problem List Items Addressed This Visit       Other   Class 2 severe obesity with serious comorbidity and body mass index (BMI) of 35.0 to 35.9 in adult [facility])   Relevant Medications   tirzepatide  (ZEPBOUND ) 2.5 MG/0.5ML Pen   Prediabetes   Pathophysiology of progression through insulin  resistance to prediabetes and diabetes was discussed at length today.  Patient to continue to monitor and be in control of total intake of snack calories which may be simple carbohydrates but should be consumed only after the patient has taken in all the nutrition for the day.  Macronutrient identification, classification and daily intake ratios were discussed.  Plan to repeat labs in 3 months to monitor both hemoglobin A1c and insulin  levels.  Patient A1c at first appointment better controlled.       Vitamin D  deficiency   Discussed importance of vitamin d  supplementation.  Vitamin d  supplementation has been shown to decrease fatigue, decrease risk of progression to insulin  resistance and then prediabetes, decreases risk of falling in older age and can even assist in decreasing depressive symptoms in PTSD.   Prescription for  Vitamin D  sent in.        Other hyperlipidemia - Primary   The 10-year ASCVD risk score (Arnett DK, et al., 2019) is: 1.5%   Values used to calculate the score:     Age: 53 years     Clincally relevant sex: Female     Is Non-Hispanic African American: Yes     Diabetic: No     Tobacco smoker: No     Systolic Blood Pressure: 111 mmHg     Is BP treated: No     HDL Cholesterol: 76 mg/dL     Total Cholesterol: 203 mg/dL Patient working on changing intake of macros with focus on increasing protein.  Will retest in 3 months.      Other Visit Diagnoses       BMI 35.0-35.9,adult           Vitals Temp: 98.3 F (36.8 C) BP: 111/67 Pulse Rate: 85 SpO2: 95 %   Anthropometric Measurements Height: 5' 3 (1.6 m) Weight: 200 lb (90.7 kg) BMI (Calculated): 35.44 Weight at Last Visit: 199 lb Weight Lost Since Last Visit: 0 Weight Gained Since Last Visit: 1 Starting Weight: 206 lb Total Weight Loss (lbs): 6 lb (2.722 kg)   Body Composition  Body Fat %: 45.2 % Fat Mass (lbs): 90.4 lbs Muscle Mass (lbs): 104.2 lbs Total Body Water (lbs): 79.6 lbs Visceral Fat Rating : 12   Other Clinical Data Today's Visit #: 28 Starting Date: 02/02/21  Comments: Cat 1+100, 1100/80     ASSESSMENT AND PLAN:  Diet: Anjannette is currently in the action stage of change. As such, her goal is to continue with weight loss efforts and has agreed to the Category 1 Plan and keeping a food journal and adhering to recommended goals of 1100 calories and 80 or more grams protein daily. Patient to start food log or journaling meal plan.  The initial goal will be to habitually log or journal for at least 4 days a week.  The expectation it that patient may not initially meet calorie or protein goals as the nturitional understanding of food intake is begun.  We discussed the 10:1 ratio when reading a food label.  Patient agrees to keep a food log either electronically or on paper and bring to the next appointment  to be able to dissect and discuss it with provider.    Exercise:  For substantial health benefits, adults should do at least 150 minutes (2 hours and 30 minutes) a week of moderate-intensity, or 75 minutes (1 hour and 15 minutes) a week of vigorous-intensity aerobic physical activity, or an equivalent combination of moderate- and vigorous-intensity aerobic activity. Aerobic activity should be performed in episodes of at least 10 minutes, and preferably, it should be spread throughout the week.  Behavior Modification:  We discussed the following Behavioral Modification Strategies today: increasing lean protein intake, decreasing simple carbohydrates, increasing vegetables, avoiding temptations, and planning for success. We discussed various medication options to help Morgan Long with her weight loss efforts and we both agreed to start wegovy  at 0.25mg  weekly.  Return in about 4 weeks (around 01/03/2024).   She was informed of the importance of frequent follow up visits to maximize her success with intensive lifestyle modifications for her multiple health conditions.  Attestation Statements:   Reviewed by clinician on day of visit: allergies, medications, problem list, medical history, surgical history, family history, social history, and previous encounter notes.     Morgan Cho, MD

## 2023-12-06 NOTE — Telephone Encounter (Signed)
 We denied your request for:  Zepbound  Inj 2.5/0.5  Policy rules found at Clinical Coverage Policy 9, Outpatient Pharmacy Program guided our decision. Here are the policy requirements your request did not meet: Per your health plan's criteria, this drug is covered if you meet the following:  (1) One of the following: (A) You have tried for at least three months, a preferred drug: The preferred drug: Wegovy  pen. (B) You cannot use the preferred drug

## 2023-12-06 NOTE — Assessment & Plan Note (Signed)
 Discussed importance of vitamin d supplementation.  Vitamin d supplementation has been shown to decrease fatigue, decrease risk of progression to insulin resistance and then prediabetes, decreases risk of falling in older age and can even assist in decreasing depressive symptoms in PTSD.   Prescription for Vitamin D sent in.

## 2023-12-06 NOTE — Telephone Encounter (Signed)
 PA for Zepbound 2.5 has been submitted, awaiting PA questions.

## 2023-12-12 ENCOUNTER — Ambulatory Visit

## 2023-12-13 ENCOUNTER — Inpatient Hospital Stay
Admission: RE | Admit: 2023-12-13 | Discharge: 2023-12-13 | Disposition: A | Discharge status: Home / Self Care | Source: Ambulatory Visit | Attending: Family Medicine | Admitting: Family Medicine

## 2023-12-13 ENCOUNTER — Other Ambulatory Visit: Payer: Self-pay | Admitting: Family Medicine

## 2023-12-13 DIAGNOSIS — Z1231 Encounter for screening mammogram for malignant neoplasm of breast: Secondary | ICD-10-CM

## 2023-12-13 LAB — HM MAMMOGRAPHY

## 2023-12-17 ENCOUNTER — Encounter: Payer: Self-pay | Admitting: Family Medicine

## 2023-12-17 ENCOUNTER — Telehealth: Payer: Self-pay | Admitting: Pharmacist

## 2023-12-17 NOTE — Telephone Encounter (Signed)
 Pharmacy Patient Advocate Encounter  Received notification from [facility] that Prior Authorization for Nurtec 75MG  dispersible tablets has been APPROVED from 12/17/2023 to 12/16/2024   PA #/Case ID/Reference #: [id number]

## 2023-12-17 NOTE — Telephone Encounter (Signed)
 Pharmacy Patient Advocate Encounter   Received notification from CoverMyMeds that prior authorization for Nurtec 75MG  dispersible tablets is required/requested.   Insurance verification completed.   The patient is insured through [facility] .   Per test claim: PA required; PA submitted to above mentioned insurance via CoverMyMeds Key/confirmation #/EOC [id number] Status is pending

## 2023-12-18 MED ORDER — QULIPTA 60 MG PO TABS: 60.0000 mg | ORAL_TABLET | Freq: Every day | ORAL | 11 refills | Status: DC

## 2023-12-18 NOTE — Telephone Encounter (Signed)
 We we will stop Botox  and start Qulipta  for migraine prevention.

## 2023-12-18 NOTE — Telephone Encounter (Signed)
 Morgan Long: I am fine with starting Qulipta  for prevention or one of the injectables for prevention or Vyepti for prevention and stopping Botox  altogether at this point.  We can start with Qulipta  if you would like.

## 2023-12-18 NOTE — Addendum Note (Signed)
 Addended by: GAYLAND LAURAINE PARAS on: 12/18/2023 04:34 PM   Modules accepted: Orders

## 2023-12-20 MED ORDER — AJOVY 225 MG/1.5ML ~~LOC~~ SOAJ: 225.0000 mg | SUBCUTANEOUS | 11 refills | Status: DC

## 2023-12-20 NOTE — Addendum Note (Signed)
 Addended by: GAYLAND LAURAINE PARAS on: 12/20/2023 08:10 AM   Modules accepted: Orders

## 2023-12-23 ENCOUNTER — Other Ambulatory Visit: Payer: Self-pay | Admitting: Neurology

## 2023-12-24 ENCOUNTER — Other Ambulatory Visit: Payer: Self-pay | Admitting: Neurology

## 2023-12-24 ENCOUNTER — Telehealth: Payer: Self-pay

## 2023-12-24 ENCOUNTER — Other Ambulatory Visit (HOSPITAL_COMMUNITY): Payer: Self-pay

## 2023-12-24 ENCOUNTER — Telehealth: Payer: Self-pay | Admitting: *Deleted

## 2023-12-24 NOTE — Telephone Encounter (Signed)
 Noted

## 2023-12-24 NOTE — Telephone Encounter (Signed)
 John called stating that the medication has been approved from 12/24/23-12/22/24. Information will be faxed over to the office.

## 2023-12-24 NOTE — Telephone Encounter (Signed)
 Pharmacy Patient Advocate Encounter   Received notification from CoverMyMeds that prior authorization for AJOVY  (fremanezumab -vfrm) injection 225MG /1.5ML auto-injectors is required/requested.   Insurance verification completed.   The patient is insured through [facility] .   Per test claim: PA required; PA submitted to above mentioned insurance via CoverMyMeds Key/confirmation #/EOC [id number] Status is pending

## 2023-12-24 NOTE — Telephone Encounter (Signed)
 Pharmacy Patient Advocate Encounter   Received notification from Physician's Office that prior authorization for Nurtec 75MG  dispersible tablets is required/requested.   Insurance verification completed.   The patient is insured through [facility] .   Per test claim: PA required; PA submitted to above mentioned insurance via CoverMyMeds Key/confirmation #/EOC [id number] Status is pending

## 2023-12-26 ENCOUNTER — Other Ambulatory Visit (HOSPITAL_COMMUNITY): Payer: Self-pay

## 2023-12-26 NOTE — Telephone Encounter (Signed)
 Pharmacy Patient Advocate Encounter  Received notification from [facility] that Prior Authorization for Ajovy  has been APPROVED from 12/24/2023 to 03/17/2024. Unable to obtain price due to refill too soon rejection, last fill date 12/25/2023 next available fill date8/14/2025   PA #/Case ID/Reference #: [id number]

## 2024-01-07 ENCOUNTER — Ambulatory Visit (INDEPENDENT_AMBULATORY_CARE_PROVIDER_SITE_OTHER): Admitting: Family Medicine

## 2024-01-07 VITALS — BP 112/77 | HR 90 | Temp 98.5°F | Ht 63.0 in | Wt 208.0 lb

## 2024-01-07 DIAGNOSIS — E66812 Obesity, class 2: Secondary | ICD-10-CM

## 2024-01-07 DIAGNOSIS — Z9189 Other specified personal risk factors, not elsewhere classified: Secondary | ICD-10-CM

## 2024-01-07 DIAGNOSIS — Z6836 Body mass index (BMI) 36.0-36.9, adult: Secondary | ICD-10-CM | POA: Diagnosis not present

## 2024-01-07 MED ORDER — PROMETHAZINE HCL 12.5 MG PO TABS: 12.5000 mg | ORAL_TABLET | Freq: Four times a day (QID) | ORAL | 1 refills | Status: DC | PRN

## 2024-01-07 MED ORDER — NURTEC 75 MG PO TBDP: 75.0000 mg | ORAL_TABLET | ORAL | 11 refills | Status: AC | PRN

## 2024-01-07 MED ORDER — WEGOVY 0.25 MG/0.5ML ~~LOC~~ SOAJ: 0.2500 mg | SUBCUTANEOUS | 0 refills | Status: DC

## 2024-01-07 NOTE — Addendum Note (Signed)
 Addended by: SHONA SAVANT A on: 01/07/2024 11:49 AM   Modules accepted: Orders

## 2024-01-07 NOTE — Progress Notes (Signed)
 SUBJECTIVE:  Chief Complaint: Obesity  Interim History: Patient has noticed over the last few weeks she has been eating and snacking more frequently due to boredom or stress.  She has started asking herself whether or not she is hungry before reaching for a snack.  Over the next few weeks she does not have much different going on.  She is using My Net Diary and the calorie allowance that she was given was 1595.  She is alternating between structured plan and food logging baed on her schedule in the day.  Morgan Long is here to discuss her progress with her obesity treatment plan. She is on the Category 1 Plan and keeping a food journal and adhering to recommended goals of 1100 calories and 80 grams of protein and states she is following her eating plan approximately 50 % of the time. She states she is walking 60 minutes 5 times per week.   OBJECTIVE: Visit Diagnoses: Problem List Items Addressed This Visit       Other   Class 2 severe obesity with serious comorbidity and body mass index (BMI) of 35.0 to 35.9 in adult [facility])   Relevant Medications   Semaglutide -Weight Management (WEGOVY ) 0.25 MG/0.5ML SOAJ   Other Visit Diagnoses       At risk for side effect of medication    -  Primary   Relevant Medications   promethazine  (PHENERGAN ) 12.5 MG tablet     BMI 36.0-36.9,adult           Vitals Temp: 98.5 F (36.9 C) BP: 112/77 Pulse Rate: 90 SpO2: 99 %   Anthropometric Measurements Height: 5' 3 (1.6 m) Weight: 208 lb (94.3 kg) BMI (Calculated): 36.85 Weight at Last Visit: 200 lb Weight Lost Since Last Visit: 0 Weight Gained Since Last Visit: 8 Starting Weight: 206 lb Total Weight Loss (lbs): 0 lb (0 kg)   Body Composition  Body Fat %: 46 % Fat Mass (lbs): 96 lbs Muscle Mass (lbs): 106.8 lbs Total Body Water (lbs): 81.2 lbs Visceral Fat Rating : 13   Other Clinical Data Today's Visit #: 29 Starting Date: 02/02/21 Comments: Cat 1, 1100/80     ASSESSMENT AND  PLAN: Assessment & Plan At risk for side effect of medication Patient's insurance states they will cover Wegovy  and history of being on GLP-1 medication because significant nausea when starting.  Patient is requesting medications to deal with nausea.  Will give prescription for promethazine  today.  25 mg promethazine  prescription sent to pharmacy. Class 2 severe obesity with serious comorbidity and body mass index (BMI) of 35.0 to 35.9 in adult, unspecified obesity type (HCC)  BMI 36.0-36.9,adult    Diet: Morgan Long is currently in the action stage of change. As such, her goal is to continue with weight loss efforts and has agreed to the Category 1 Plan and keeping a food journal and adhering to recommended goals of 1100 calories and 80 or more grams of protein.   Exercise:  All adults should avoid inactivity. Some activity is better than none, and adults who participate in any amount of physical activity, gain some health benefits.  Behavior Modification:  We discussed the following Behavioral Modification Strategies today: increasing lean protein intake, decreasing simple carbohydrates, increasing vegetables, meal planning and cooking strategies, planning for success, and keep a strict food journal. We discussed various medication options to help Morgan Long with her weight loss efforts and we both agreed to see if insurance would cover Wegovy .  Previously insurance had denied that  bound stating preference for initial treatment with Wegovy .  Prior authorization will be completed for Wegovy  0.25 mg subcutaneous weekly.  Return in about 4 weeks (around 02/04/2024).   She was informed of the importance of frequent follow up visits to maximize her success with intensive lifestyle modifications for her multiple health conditions.  Attestation Statements:   Reviewed by clinician on day of visit: allergies, medications, problem list, medical history, surgical history, family history, social history, and previous  encounter notes.   Morgan Cho, MD

## 2024-01-09 ENCOUNTER — Encounter (INDEPENDENT_AMBULATORY_CARE_PROVIDER_SITE_OTHER): Payer: Self-pay

## 2024-01-09 DIAGNOSIS — D1803 Hemangioma of intra-abdominal structures: Secondary | ICD-10-CM | POA: Diagnosis not present

## 2024-01-09 DIAGNOSIS — K7689 Other specified diseases of liver: Secondary | ICD-10-CM | POA: Diagnosis not present

## 2024-01-10 ENCOUNTER — Telehealth: Payer: Self-pay | Admitting: Neurology

## 2024-01-10 DIAGNOSIS — G43709 Chronic migraine without aura, not intractable, without status migrainosus: Secondary | ICD-10-CM

## 2024-01-10 NOTE — Telephone Encounter (Signed)
 Completed [employer] PA form and faxed with notes to [phone].

## 2024-01-14 DIAGNOSIS — M722 Plantar fascial fibromatosis: Secondary | ICD-10-CM | POA: Diagnosis not present

## 2024-01-21 DIAGNOSIS — L219 Seborrheic dermatitis, unspecified: Secondary | ICD-10-CM | POA: Diagnosis not present

## 2024-01-21 DIAGNOSIS — L658 Other specified nonscarring hair loss: Secondary | ICD-10-CM | POA: Diagnosis not present

## 2024-01-21 DIAGNOSIS — L650 Telogen effluvium: Secondary | ICD-10-CM | POA: Diagnosis not present

## 2024-01-22 NOTE — Telephone Encounter (Signed)
 [employer] auth#: [id number] (01/10/24-12/11/24)

## 2024-01-23 DIAGNOSIS — H0102B Squamous blepharitis left eye, upper and lower eyelids: Secondary | ICD-10-CM | POA: Diagnosis not present

## 2024-01-23 DIAGNOSIS — H524 Presbyopia: Secondary | ICD-10-CM | POA: Diagnosis not present

## 2024-01-23 DIAGNOSIS — H2513 Age-related nuclear cataract, bilateral: Secondary | ICD-10-CM | POA: Diagnosis not present

## 2024-01-23 DIAGNOSIS — H04123 Dry eye syndrome of bilateral lacrimal glands: Secondary | ICD-10-CM | POA: Diagnosis not present

## 2024-01-23 DIAGNOSIS — H0102A Squamous blepharitis right eye, upper and lower eyelids: Secondary | ICD-10-CM | POA: Diagnosis not present

## 2024-01-25 ENCOUNTER — Encounter: Payer: Self-pay | Admitting: Family Medicine

## 2024-01-25 ENCOUNTER — Ambulatory Visit (INDEPENDENT_AMBULATORY_CARE_PROVIDER_SITE_OTHER): Admitting: Family Medicine

## 2024-01-25 VITALS — BP 112/80 | HR 80 | Temp 98.9°F | Resp 18 | Ht 63.0 in | Wt 210.0 lb

## 2024-01-25 DIAGNOSIS — G43001 Migraine without aura, not intractable, with status migrainosus: Secondary | ICD-10-CM | POA: Diagnosis not present

## 2024-01-25 NOTE — Progress Notes (Signed)
 Subjective:    Patient ID: Morgan Long, female    DOB: [birth year], 53 y.o.   MRN: [id number]  Chief Complaint  Patient presents with   Eye Problem    Pt states she was seen in the ED in June and her eye dr advised that her nerves behind her eyes are swollen    HPI Patient is in today for er f/u.    Discussed the use of AI scribe software for clinical note transcription with the patient, who gave verbal consent to proceed.  History of Present Illness Morgan Long is a 53 year old female who presents with increased intensity of migraines and concerns about temporal arteritis.  She has experienced an increase in the intensity of her migraines recently. She underwent two root canals and subsequently visited the ER, where she was advised to have an emergency temporal artery biopsy due to concerns about temporal arteritis. However, the procedure was not performed during her 20-hour stay, and she was instead treated with steroids.  She visited an eye doctor who recommended blood tests, specifically ESR and CRP, to further evaluate her condition. Her ESR was 50, which she noted was not as high as typically seen in temporal arteritis cases.  She has been experiencing mild headaches today and has not taken any medication for them. It is time for her Botox  shots. It takes about two weeks for the Botox  to take effect after administration.    Past Medical History:  Diagnosis Date   Allergic rhinitis    Asthma    B12 deficiency    Chronic migraine    Dr. Malcom   Constipation    Dry eye syndrome of both lacrimal glands    Dry skin    Endometriosis    Fatigue    Gastroparesis    GERD (gastroesophageal reflux disease)    Headache(784.0)    occasional, dx w/ Migraines before, Topamax helps   Heartburn    Hepatic steatosis    History of stomach ulcers    Lactose intolerance    Localized edema    Migraine    Muscle weakness (generalized)    Nuclear cataract of both eyes    Mild    Overweight    Pain in right ankle and joints of right foot    Prediabetes    Shortness of breath    Sinus complaint    Sinusitis    Tired    TMJ pain dysfunction syndrome    occasional   Vitamin D  deficiency     Past Surgical History:  Procedure Laterality Date   ANKLE SURGERY Left 11/16/2017   AUGMENTATION MAMMAPLASTY Bilateral 2006   BREAST ENHANCEMENT SURGERY  2006   BUNIONECTOMY     COLONOSCOPY     endrometroisis     fallopian tube removed     Left   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   UPPER GASTROINTESTINAL ENDOSCOPY     [facility] teeth extraction      Family History  Problem Relation Age of Onset   Hyperlipidemia Mother    Hypertension Mother    Diabetes Mother    Throat cancer Mother    Cancer - Other Mother 11       throat - died 6 months   Cancer Mother    Alcoholism Mother    Anxiety disorder Mother    Stroke Father    Heart disease Father    Cancer Father  Colon cancer Paternal Grandfather    Breast cancer Maternal Aunt        breast   Breast cancer Maternal Aunt        breast   Pancreatic cancer Maternal Aunt    Hypertension Maternal Aunt        several family members   Breast cancer Maternal Aunt        total of 5 aunts   Breast cancer Maternal Aunt    Colon cancer Maternal Uncle 44       died 82   Throat cancer Maternal Uncle    Diabetes Other        grandmother   Asthma Other        cousin, maternal   Heart attack Neg Hx    Rectal cancer Neg Hx    Stomach cancer Neg Hx     Social History   Socioeconomic History   Marital status: Single    Spouse name: Not on file   Number of children: 0   Years of education: BS   Highest education level: Master's degree (e.g., MA, MS, MEng, MEd, MSW, MBA)  Occupational History   Occupation: Sports coach, going to school    Employer: [employer]   Occupation: Wellsite geologist   Occupation: Chief Executive Officer  Tobacco Use   Smoking status: Never     Passive exposure: Never   Smokeless tobacco: Never  Vaping Use   Vaping status: Never Used  Substance and Sexual Activity   Alcohol use: Not Currently    Comment: socially - occasional    Drug use: No   Sexual activity: Yes    Birth control/protection: Inserts    Comment: nuvaring  Other Topics Concern   Not on file  Social History Narrative   Household:sister and her 3 kids    Drinks occasional starbucks drink       Social Drivers of Health   Financial Resource Strain: Patient Declined (09/29/2022)   Overall Financial Resource Strain (CARDIA)    Difficulty of Paying Living Expenses: Patient declined  Food Insecurity: Food Insecurity Present (09/29/2022)   Hunger Vital Sign    Worried About Running Out of Food in the Last Year: Patient declined    Ran Out of Food in the Last Year: Sometimes true  Transportation Needs: No Transportation Needs (09/29/2022)   PRAPARE - Administrator, Civil Service (Medical): No    Lack of Transportation (Non-Medical): No  Physical Activity: Insufficiently Active (09/29/2022)   Exercise Vital Sign    Days of Exercise per Week: 2 days    Minutes of Exercise per Session: 50 min  Stress: No Stress Concern Present (09/29/2022)   [employer] of Occupational Health - Occupational Stress Questionnaire    Feeling of Stress : Only a little  Social Connections: Moderately Integrated (09/29/2022)   Social Connection and Isolation Panel    Frequency of Communication with Friends and Family: More than three times a week    Frequency of Social Gatherings with Friends and Family: Once a week    Attends Religious Services: 1 to 4 times per year    Active Member of [employer] or Organizations: Yes    Attends Banker Meetings: 1 to 4 times per year    Marital Status: Never married  Intimate Partner Violence: Not on file    Outpatient Medications Prior to Visit  Medication Sig Dispense Refill   Apremilast (OTEZLA) 30 MG TABS Take 1  tablet by  mouth 2 (two) times daily.     cetirizine  (ZYRTEC ) 10 MG tablet TAKE 1 TABLET BY MOUTH EVERY DAY 90 tablet 1   clindamycin  (CLEOCIN  T) 1 % external solution Apply topically 2 (two) times daily. 30 mL 4   cyanocobalamin  (VITAMIN B12) 1000 MCG/ML injection Inject 1 mL (1,000 mcg total) into the muscle every 30 (thirty) days. 3 mL 3   etonogestrel-ethinyl estradiol  (NUVARING) 0.12-0.015 MG/24HR vaginal ring Place 1 each vaginally every 28 (twenty-eight) days. Insert vaginally and leave in place for 3 consecutive weeks, then remove for 1 week.     Fremanezumab -vfrm (AJOVY ) 225 MG/1.5ML SOAJ Inject 225 mg into the skin every 30 (thirty) days. 1.68 mL 11   Multiple Vitamin (MULTIVITAMIN) LIQD Take 5 mLs by mouth daily.     promethazine  (PHENERGAN ) 12.5 MG tablet Take 1 tablet (12.5 mg total) by mouth every 6 (six) hours as needed for nausea or vomiting. 30 tablet 1   Rimegepant Sulfate (NURTEC) 75 MG TBDP Take 1 tablet (75 mg total) by mouth as needed. Take 1 tablet at onset of headache, max is 1 tablet in 24 hours. 8 tablet 11   zolmitriptan  (ZOMIG ) 5 MG tablet TAKE 1 TABLET (5 MG TOTAL) BY MOUTH AS NEEDED FOR MIGRAINE. MAY REPEAT IN 2 HOURS AS NEEDED. NO MORE THAN 2 PILLS/24 HOURS, NO MORE THAN 3 PILLS/WEEK. 10 tablet 1   Semaglutide -Weight Management (WEGOVY ) 0.25 MG/0.5ML SOAJ Inject 0.25 mg into the skin once a week. 2 mL 0   tiZANidine  (ZANAFLEX ) 4 MG tablet Take 1 tablet (4 mg total) by mouth every 6 (six) hours as needed for muscle spasms (as needed for muscle tension). 20 tablet 1   promethazine -dextromethorphan (PROMETHAZINE -DM) 6.25-15 MG/5ML syrup Take 5 mLs by mouth 4 (four) times daily as needed. 118 mL 0   No facility-administered medications prior to visit.    Allergies  Allergen Reactions   Acetaminophen -Codeine  Itching   Hydrocodone  Nausea Only   Oxycodone  Itching   Tyloxapol Other (See Comments)    Other reaction(s): Unknown   Codeine  Itching and Nausea Only     Review of Systems  Constitutional:  Negative for fever and malaise/fatigue.  HENT:  Negative for congestion.   Eyes:  Negative for blurred vision.  Respiratory:  Negative for cough and shortness of breath.   Cardiovascular:  Negative for chest pain, palpitations and leg swelling.  Gastrointestinal:  Negative for vomiting.  Musculoskeletal:  Negative for back pain.  Skin:  Negative for rash.  Neurological:  Positive for headaches. Negative for loss of consciousness.       Objective:    Physical Exam Vitals and nursing note reviewed.  Constitutional:      General: She is not in acute distress.    Appearance: Normal appearance. She is well-developed.  HENT:     Head: Normocephalic and atraumatic.  Eyes:     General: No scleral icterus.       Right eye: No discharge.        Left eye: No discharge.  Cardiovascular:     Rate and Rhythm: Normal rate and regular rhythm.     Heart sounds: No murmur heard. Pulmonary:     Effort: Pulmonary effort is normal. No respiratory distress.     Breath sounds: Normal breath sounds.  Musculoskeletal:        General: Normal range of motion.     Cervical back: Normal range of motion and neck supple.     Right lower leg: No edema.  Left lower leg: No edema.  Skin:    General: Skin is warm and dry.  Neurological:     Mental Status: She is alert and oriented to person, place, and time.  Psychiatric:        Mood and Affect: Mood normal.        Behavior: Behavior normal.        Thought Content: Thought content normal.        Judgment: Judgment normal.     BP 112/80 (BP Location: Left Arm, Patient Position: Sitting, Cuff Size: Large)   Pulse 80   Temp 98.9 F (37.2 C) (Oral)   Resp 18   Ht 5' 3 (1.6 m)   Wt 210 lb (95.3 kg)   SpO2 97%   BMI 37.20 kg/m  Wt Readings from Last 3 Encounters:  01/25/24 210 lb (95.3 kg)  01/07/24 208 lb (94.3 kg)  12/06/23 200 lb (90.7 kg)    Diabetic Foot Exam - Simple   No data filed     Lab Results  Component Value Date   WBC 8.4 11/28/2023   HGB 13.1 11/28/2023   HCT 39.4 11/28/2023   PLT 350 11/28/2023   GLUCOSE 82 11/28/2023   CHOL 203 (H) 11/01/2023   TRIG 111 11/01/2023   HDL 76 11/01/2023   LDLCALC 108 (H) 11/01/2023   ALT 17 11/01/2023   AST 18 11/01/2023   NA 141 11/28/2023   K 4.0 11/28/2023   CL 106 11/28/2023   CREATININE 0.68 11/28/2023   BUN 16 11/28/2023   CO2 24 11/28/2023   TSH 1.800 11/01/2023   INR 1.0 12/15/2019   HGBA1C 5.4 11/01/2023    Lab Results  Component Value Date   TSH 1.800 11/01/2023   Lab Results  Component Value Date   WBC 8.4 11/28/2023   HGB 13.1 11/28/2023   HCT 39.4 11/28/2023   MCV 89.3 11/28/2023   PLT 350 11/28/2023   Lab Results  Component Value Date   NA 141 11/28/2023   K 4.0 11/28/2023   CO2 24 11/28/2023   GLUCOSE 82 11/28/2023   BUN 16 11/28/2023   CREATININE 0.68 11/28/2023   BILITOT <0.2 11/01/2023   ALKPHOS 91 11/01/2023   AST 18 11/01/2023   ALT 17 11/01/2023   PROT 7.0 11/01/2023   ALBUMIN 4.0 11/01/2023   CALCIUM 8.6 (L) 11/28/2023   ANIONGAP 11 11/28/2023   EGFR 79 11/01/2023   GFR 83.45 05/25/2023   Lab Results  Component Value Date   CHOL 203 (H) 11/01/2023   Lab Results  Component Value Date   HDL 76 11/01/2023   Lab Results  Component Value Date   LDLCALC 108 (H) 11/01/2023   Lab Results  Component Value Date   TRIG 111 11/01/2023   Lab Results  Component Value Date   CHOLHDL 3 02/12/2023   Lab Results  Component Value Date   HGBA1C 5.4 11/01/2023       Assessment & Plan:  Migraine without aura and with status migrainosus, not intractable -     C-reactive protein; Future -     Sedimentation rate; Future  .Assessment and Plan Assessment & Plan 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 A previous ER visit suggested a temporal artery biopsy, but she chose prednisone  instead. ESR and  CRP levels were not significantly elevated, reducing the likelihood of giant cell arteritis. Differential diagnosis includes other causes of headache and optic nerve inflammation.  She prefers to avoid invasive procedures unless necessary. Order ESR and CRP blood tests. Consider referral to a neurologist for a temporal artery biopsy if blood tests indicate elevated levels.  Migraine   Chronic migraines have recently increased in intensity. She reports a mild headache today and is due for Botox  injections, which typically take 2-3 weeks to become effective.    Kaetlyn Noa R Lowne Chase, DO

## 2024-01-26 LAB — C-REACTIVE PROTEIN: CRP: 12.2 mg/L — ABNORMAL HIGH (ref <8.0)

## 2024-01-26 LAB — SEDIMENTATION RATE: Sed Rate: 17 mm/h (ref 0–30)

## 2024-01-28 ENCOUNTER — Encounter: Payer: Self-pay | Admitting: Family Medicine

## 2024-01-31 ENCOUNTER — Telehealth: Payer: Self-pay | Admitting: Neurology

## 2024-01-31 NOTE — Telephone Encounter (Signed)
 Pt called to inform That PCP recommended pt to call Neurologist due to Pt had a CRP done by PCP and Pt  CRP is an Inflammatory  marker . Pt also states that  She seen her Eye Doctor who inform PT THAT  nerve behind Eye was abnormal . Pt is requesting call back to discuss with Neurologist .

## 2024-01-31 NOTE — Telephone Encounter (Signed)
 Pt states that   2 month ago  Pt went to ER   at  [facility]   Emergency Department  ([place]) Pt states that  They were the first to do  CRP and ESR which showed PT levels were Elevated . Pt took information from that  visit to PCP . PCP Which is DR.Jamee JONELLE Meth Lowene  (under [place] /   ) redid  CRP and ESR to confirm Pt levels were indeed Elevated . All Labs should be under [facility]. Pt PCP wanted PT to follow up with Neurologist .  Correction PT could not remember name of Eye doctor but she went to  [facility] in [place], [place])  did not do any labs work only notice  that PT Nerves behind eye was Abnormal . Pt gave number to Facility  [phone]

## 2024-01-31 NOTE — Telephone Encounter (Signed)
 Phone rm: Please tell pt that we will need to obtain these records first. The eye doctor will need to fax records. Confirm the lab was done by pcp in [place]

## 2024-01-31 NOTE — Telephone Encounter (Signed)
 I reviewed office notes from ophthalmology, no papilledema was mentioned, normal ONH.  Mention of patient reporting headaches, jaw popping, previous scalp tenderness.  ER recommended a temporal artery biopsy patient declined back in June.   She was treated with prednisone  back in June, her headaches got better. No vision changes.   I will discuss with Dr. Buck next week about seeing patient to discuss.   01/25/24 Sed rate normal 17, CRP 12.5 (8 years ago it was 15.6)

## 2024-01-31 NOTE — Telephone Encounter (Signed)
 Pt has appt with Neuro on 09/11

## 2024-02-05 ENCOUNTER — Encounter (INDEPENDENT_AMBULATORY_CARE_PROVIDER_SITE_OTHER): Payer: Self-pay | Admitting: Family Medicine

## 2024-02-05 ENCOUNTER — Ambulatory Visit (INDEPENDENT_AMBULATORY_CARE_PROVIDER_SITE_OTHER): Admitting: Family Medicine

## 2024-02-05 VITALS — BP 121/76 | HR 88 | Temp 98.5°F | Ht 63.0 in | Wt 206.0 lb

## 2024-02-05 DIAGNOSIS — G43001 Migraine without aura, not intractable, with status migrainosus: Secondary | ICD-10-CM

## 2024-02-05 DIAGNOSIS — R7303 Prediabetes: Secondary | ICD-10-CM | POA: Diagnosis not present

## 2024-02-05 DIAGNOSIS — Z6836 Body mass index (BMI) 36.0-36.9, adult: Secondary | ICD-10-CM

## 2024-02-05 MED ORDER — LOMAIRA 8 MG PO TABS: 8.0000 mg | ORAL_TABLET | Freq: Two times a day (BID) | ORAL | 0 refills | Status: DC

## 2024-02-05 NOTE — Progress Notes (Signed)
 SUBJECTIVE:  Chief Complaint: Obesity  Interim History: Over the last few weeks patient has been watching the times she is eating and what she is eating.  She is trying to stay focused and increasing protein intake.  She mentions she doesn't feel like she is doing enough.  Intake wise she is a bit over calories most days around 1800 calories and then she is right on target protein wise.  Some days she mentions she forgets to eat until 6 at night. No change in upcoming month schedule.  Morgan Long is here to discuss her progress with her obesity treatment plan. She is on the Category 1 Plan and states she is following her eating plan approximately 85 % of the time. She states she is exercising 60 minutes 3-4 times per week.   OBJECTIVE: Visit Diagnoses: Problem List Items Addressed This Visit   None   Vitals Temp: 98.5 F (36.9 C) BP: 121/76 Pulse Rate: 88 SpO2: 100 %   Anthropometric Measurements Height: 5' 3 (1.6 m) Weight: 206 lb (93.4 kg) BMI (Calculated): 36.5 Weight at Last Visit: 208 lb Weight Lost Since Last Visit: 2 Weight Gained Since Last Visit: 0 Starting Weight: 206 lb   Body Composition  Body Fat %: 44.6 % Fat Mass (lbs): 91.8 lbs Muscle Mass (lbs): 108.4 lbs Total Body Water (lbs): 78 lbs Visceral Fat Rating : 13   Other Clinical Data Today's Visit #: 30 Starting Date: 02/02/21 Comments: 1300/90     ASSESSMENT AND PLAN: Assessment & Plan Prediabetes Labs done 3 months ago showing A1c of 5.4.  Patient is to repeat A1c later this week.  She voices she has been trying to focus on protein intake getting to goal but has often been over calorie goal.  Will need repeat labs completely done by end of year. Migraine without aura and with status migrainosus, not intractable Patient currently experiencing migraine.  She mentions that these have been no more frequent than they were previously.  Will discuss with patient migraine treatment and plan for further  evaluation at next appointment. Morbid obesity (HCC)  BMI 36.0-36.9,adult    Diet: Morgan Long is currently in the action stage of change. As such, her goal is to continue with weight loss efforts and has agreed to the Category 1 Plan and keeping a food journal and adhering to recommended goals of 1100 calories and 80 or more grams of protein.   Exercise:  For substantial health benefits, adults should do at least 150 minutes (2 hours and 30 minutes) a week of moderate-intensity, or 75 minutes (1 hour and 15 minutes) a week of vigorous-intensity aerobic physical activity, or an equivalent combination of moderate- and vigorous-intensity aerobic activity. Aerobic activity should be performed in episodes of at least 10 minutes, and preferably, it should be spread throughout the week.  Behavior Modification:  We discussed the following Behavioral Modification Strategies today: increasing lean protein intake, decreasing simple carbohydrates, increasing vegetables, and planning for success. We discussed various medication options to help Morgan Long with her weight loss efforts and we both agreed to restart phentermine  8 mg dose twice daily to see if that helps with patient's intake.Morgan Long  No follow-ups on file.   She was informed of the importance of frequent follow up visits to maximize her success with intensive lifestyle modifications for her multiple health conditions.  Attestation Statements:   Reviewed by clinician on day of visit: allergies, medications, problem list, medical history, surgical history, family history, social history, and previous encounter  notes.     Morgan Cho, MD

## 2024-02-06 NOTE — Telephone Encounter (Signed)
 Patient has been scheduled with Dr Buck for tomorrow 9/4 to discuss possible temporal artery biopsy. Keeping 9/11 visit for botox .

## 2024-02-06 NOTE — Telephone Encounter (Signed)
 I really can't think of anything else to offer patient. If she is agreeable to a temporal artery Bx we can see her separately to discuss. Otherwise we will see her as scheduled for her Botox .

## 2024-02-06 NOTE — Telephone Encounter (Signed)
 Phentermine  HCl (LOMAIRA ) 8 MG TABS 60 tablet 0 02/05/2024 --   Sig - Route: Take 1 tablet (8 mg total) by mouth 2 (two) times daily. - Oral   Sent to pharmacy as: Phentermine  HCl (LOMAIRA ) 8 MG Tab   E-Prescribing Status: Receipt confirmed by pharmacy (02/05/2024 12:05 PM EDT)

## 2024-02-06 NOTE — Telephone Encounter (Signed)
 [id number] - [place], [place] - [address]  [address], [place] [address]

## 2024-02-07 ENCOUNTER — Ambulatory Visit (INDEPENDENT_AMBULATORY_CARE_PROVIDER_SITE_OTHER): Admitting: Neurology

## 2024-02-07 VITALS — BP 128/77 | HR 90 | Ht 63.0 in | Wt 211.4 lb

## 2024-02-07 DIAGNOSIS — H538 Other visual disturbances: Secondary | ICD-10-CM

## 2024-02-07 DIAGNOSIS — R700 Elevated erythrocyte sedimentation rate: Secondary | ICD-10-CM

## 2024-02-07 DIAGNOSIS — M26609 Unspecified temporomandibular joint disorder, unspecified side: Secondary | ICD-10-CM

## 2024-02-07 DIAGNOSIS — R519 Headache, unspecified: Secondary | ICD-10-CM | POA: Diagnosis not present

## 2024-02-07 DIAGNOSIS — R7982 Elevated C-reactive protein (CRP): Secondary | ICD-10-CM

## 2024-02-07 NOTE — Patient Instructions (Addendum)
 We will consider a referral to a neur-ophthalmologist next.  We will check inflammatory and autoimmune markers in a blood test today. I will order an orbital MRI.  Follow up as scheduled for Botox  inj.

## 2024-02-07 NOTE — Progress Notes (Signed)
 Subjective:    Patient ID: Morgan Long is a 53 y.o. female.  HPI    Interim history:   Morgan Long is a 54 year old right-handed woman with an underlying medical history of allergic rhinitis, endometriosis, TMJ problems, Reflux disease, gastroparesis, hepatic congestion with steatosis, s/p partial hepatectomy for hepatic tumor, lactose intolerance, neck pain, obesity, and migraine headaches (on Botox  injections through our office every 3 months), who presents for a new problem visit of recurrent temporal headaches.  The patient is unaccompanied today.  She had her last Botox  injection with Lauraine Born, NP on 11/15/2023 and continues to take Ajovy  injections once a month for prevention as well.  Today, 02/07/2024: She reports issues with TMJ and having temporal headaches, she has had intermittent blurry vision.  She went to the emergency room a couple of months ago and I reviewed the emergency room records from 11/28/2023.  She reported having a severe headache for 3 days, she had blurry vision at the time but no vision loss.  She had some jaw issues including what sounded like jaw claudication.  She felt her headache was worse in the right temporal area.  She was treated with prednisone , Reglan , Benadryl , Decadron  and Toradol  symptomatically and was also given a course of prednisone  for about a week.  She had a mildly elevated ESR at 50 and CRP was also elevated.  She was recommended for inpatient admission for temporal artery biopsy but decided to go home AMA.  She has since then seen her eye doctor at [facility].  I reviewed the office visit note from 01/23/2024.  She was noted to have squamous blepharitis left eye, dry eye syndrome, no disc edema or pallor was seen on either side.  She was found to have normal vessels, clear macula, spontaneous venous pulsations were present.  She was found to have age-related bilateral cataracts.  She reports that she is not aware of any cataracts and that she  was not told she had any cataracts.  She was recommended for repeat ESR and CRP.  She had blood work on 01/25/2024, which showed CRP of 12.2, elevated and ESR of 17, normal.  Of note, this was after she completed a course of prednisone .  She denies any floaters, in fact, she feels that her floaters are better.  She does have some blurry vision and has difficulty reading up close, especially in the computer, she does not have any issues reading further back, does realize that she may need reading glasses.  She has not had any strokelike symptoms, she does not really have any temporal artery tenderness.  She is not keen on getting temporal artery biopsy but would be willing to get further evaluation done today and we talked about our options in detail.    Previously (copied from previous notes for reference):   01/03/2022 (17th inj): She reports doing okay, no side effects from the Botox , she does report having received about 50 units of Botox  injections into her jaw for TMJ on 12/21/2021 and in June she also received Botox  cosmetic, unclear of the dose.  She is advised that any Botox  injection in close proximity is additive/cumulative and may increase her risk for side effects but we mutually agreed to proceed with her standard migraine injections today.   Written informed consent for recurrent, 3 monthly intramuscular injections with botulinum toxin for this indication has previously been obtained and scanned into the patient's electronic chart. I will re-consent if the type of botulinum  toxin used or the indication for injection changes for this patient in the future. The patient is informed that we will use the same consent for her recurrent, most likely 3 monthly injections. She demonstrated understanding and voiced agreement.   I have previously talked to the patient in detail about expectations, limitations, benefits as well as potential adverse effects of botulinum toxin injections. The patient  understands that the side effects include (but are not limited to): Mouth dryness, dryness of eyes, speech and swallowing difficulties, respiratory depression or problems breathing, weakness of muscles including more distant muscles than the ones injected, flu-like symptoms, myalgias, injection site reactions such as redness, itching, swelling, pain, and infection.  She had her 16th injection on 10/06/2021, at which time she reported doing well with good results with repeat Botox  injections.  She had no side effects.  She had her 15th injection on 07/13/21 and reporting doing well.    She had her 14th injection on 04/13/21, at which time she reported doing well.    She had her 13th injection on 01/11/2021, at which time she reported doing well.  She had experienced no side effects.   She called in the interim reporting breakthrough migraines.  Her primary care ordered a brain MRI.  She had a brain MRI without contrast on 03/12/2021 and I reviewed the results: IMPRESSION: No evidence of acute intracranial abnormality.   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   Otherwise unremarkable non-contrast MRI appearance of the brain.   She had her 12th injection on 10/11/2020, at which time she reported doing well, latest injection had worked well.   She had her 10th injection on 03/31/2020, at which time she reported, she had done well.   She had an interim injection with Lauraine Born on 07/12/2020.     She missed an injection in between and had her ninth injection with Amy Lomax, nurse practitioner on 12/29/2019.   She had her 8th injection on 05/08/2019 at which time she reported that she had not taken the [name] .  She was unsure if she had any refills.  She presented with a migraine.  She tolerated the Botox  injection reasonably well.       I saw her on 01/30/2019 for her seventh injection, at which time she reported that the Botox   injections were very effective and that the new medication [name]  was more helpful for acute use than her triptan before.   I saw her on 10/24/2018 for her 6th injection, at which time she had noticed a recent flareup of her migraines.  She did not think that the [name]  had helped that much.  She was a little overdue for her Botox  injection at the time.   I saw her on 07/17/2018 for her fifth injection, at which time she reported Botox  had been helpful, she reported overall very good results, no side effects, her migraines had reduced by at least 7 days a month, she also reported reduction by at least 100 hours/month of her migraine headaches.  She did report that she had received cosmetic Botox  injection in her eyebrow area.   I saw her on 04/10/2018 for her 4th injection, at which time she reported good results with her Botox  injections in significant reduction in her headache frequency and severely overall. She denied any side effects. She was overdue by nearly 2 weeks for her injection at the time.   I saw her on 12/26/2017 for  her 3rd injection, at which time she reported having had a recent increase in her headache frequency, she had recent foot surgery.   I saw her on 09/25/2017 for her second round of injections. She felt that in the week prior to the injection her headaches were more intense.   I saw her on 06/28/2017 for her initial Botox  injection, at which time she received 155 units of Botox .   She was seen by her primary care physician and received a Toradol  injection on 06/19/2017 as well as a prescription for Relpax .   04/25/17: Dr. Lindy retired. She reports a long-standing history of migraine headaches, she recalls she was about 53 years old when she started having migraines. For quite some time she had spontaneous improvement in her migraines but some 4 or 5 years ago started having more frequent migraines. She has tried and failed multiple preventative medications but does not  have a list with her and prior records from Dr. Forestine office are not available today. She reports that his office is closed at this point. She did not get her records from his office. Perhaps her primary care provider has records. I encouraged her to try to obtain those records as it will help us  manage her migraines optimally. She recalls having tried Topamax. She had hair loss from some medications but is not completely sure which ones. Topamax did not help, Zonegran  caused her sedation, nortriptyline was also tried but she is unsure what happened with it. Amitriptyline she may have tried as well. She did not try Depakote. She is worried about sedation side effects as she also is going to school and has to be alert throughout the day. She takes occasional nausea medicine which likely is Zofran  under the tongue. She has tried Maxalt  under the tongue which is helpful at times. She needs a refill on that. She has regular eye exams because of her history of dry eyes. She used to wear a single contact but was advised to stop using it. She has had 2 rounds of Botox  injections, last time about 3 months ago as she recalls. She remembers doing okay with it. Prior to starting Botox  injections she reports having about 18-20 headache days per month typically. She does not always have a typical aura but sometimes has visual blurriness and bringing in her ears to warn her about an upcoming migraine headache. She has occasional nausea that precedes the headache.   I saw her on 06/09/2015 at which time she was referred is a new patient referral for a new problem, referred by her optometrist at the time for 1 month history of blurry vision. Her exam at the time was nonfocal and reassuringly she had no significant eye related findings. I suggested further workup in the form of visual evoked potentials, blood work, and she also reported a 6 month history of feeling tired. She had no one-sided weakness, tingling or numbness.  Symptoms from the past which included paresthesias had resolved completely. She was under the care of Dr. Lindy for migraines. She was in the process of titrating Zonegran . She was on 225 mg each night. When she took 300 mg each night she felt too sleepy during the day. She denied any symptoms of sleep disordered breathing. She did admit not being a good sleeper. She did endorse stress as she was in school online for psychology and was also working off-and-on in Research officer, political party. She reported not drinking sodas, not drinking alcohol and she reported not  smoking. She did report pain with eye movements and dry eyes. She reported no family history of multiple sclerosis or lupus. She denied joint pain with the exception of bilateral knee pain and she also reported a 50 pound weight gain in the last year. She had visual evoked potentials on 07/06/2015: Impression: The visual evoked response test above was within normal limits bilaterally. No evidence of conduction slowing was seen within the anterior visual pathways on either side on today's evaluation.   We called her with her test results. Labs from 06/09/2015 showed normal A1c, normal vitamin D  level, normal ANA, normal RF, normal ESR. CRP was elevated at 15.6. 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   She had a brain MRI with and without contrast on 06/23/2015: IMPRESSION:  This is a normal MRI of the brain with and without contrast In addition, personally reviewed the images through the PACS system. We called her with her test results.   I first met her on 05/22/2014 at the request of her neurosurgeon, Dr. Lanis, at which time the patient reported intermittent right arm numbness, particularly with neck position changes. I suggested blood work and EMG and nerve conduction testing of the right  upper extremity. Her blood work showed elevated B12 and B6 levels, indicative of B vitamin supplementation. Hemoglobin A1c was 5.7. We called her with her test results. She had EMG and nerve conduction testing on 06/02/2014: IMPRESSION:   Nerve conduction studies done on both upper extremities were unremarkable, without evidence of a neuropathy seen. EMG evaluation of the right upper extremity was unremarkable, without evidence of an overlying cervical radiculopathy. We called her with her test results. At the time, she reported improved symptoms.   05/22/2014: She has intermittent right arm numbness. Her symptoms have been going on for about 6 months. She has not noted any permanent numbness and no issues elsewhere. She does not have any significant weakness and sometimes feels weak when the numbness seems to come on. It goes away if she changes positions or adjusts her neck position. She has had right shoulder problems and pain in the right shoulder.   You saw her on 05/14/2014 for neck pain. She had undergone physical therapy without improvement of her neck pain. She had cervical epidural steroid injections which helped for about 24 hours as understand.    She had a C-spine MRI without contrast on 01/07/2014: Mild cervical spondylosis as described above without significant disc protrusion, foraminal stenosis or central canal stenosis.   Blood work from 05/14/2014 was reviewed: She had a BMP, CBC with differential, liver function panel, lipid panel and TSH all of which were fine with the exception of a borderline LDL of 111.  Her Past Medical History Is Significant For: Past Medical History:  Diagnosis Date   Allergic rhinitis    Asthma    B12 deficiency    Chronic migraine    Dr. Malcom   Constipation    Dry eye syndrome of both lacrimal glands    Dry skin    Endometriosis    Fatigue    Gastroparesis    GERD (gastroesophageal reflux disease)    Headache(784.0)    occasional, dx w/  Migraines before, Topamax helps   Heartburn    Hepatic steatosis    History of stomach ulcers    Lactose intolerance  Localized edema    Migraine    Muscle weakness (generalized)    Nuclear cataract of both eyes    Mild   Overweight    Pain in right ankle and joints of right foot    Prediabetes    Shortness of breath    Sinus complaint    Sinusitis    Tired    TMJ pain dysfunction syndrome    occasional   Vitamin D  deficiency     Her Past Surgical History Is Significant For: Past Surgical History:  Procedure Laterality Date   ANKLE SURGERY Left 11/16/2017   AUGMENTATION MAMMAPLASTY Bilateral 2006   BREAST ENHANCEMENT SURGERY  2006   BUNIONECTOMY     COLONOSCOPY     endrometroisis     fallopian tube removed     Left   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   UPPER GASTROINTESTINAL ENDOSCOPY     [facility] teeth extraction      Her Family History Is Significant For: Family History  Problem Relation Age of Onset   Hyperlipidemia Mother    Hypertension Mother    Diabetes Mother    Throat cancer Mother    Cancer - Other Mother 59       throat - died 6 months   Cancer Mother    Alcoholism Mother    Anxiety disorder Mother    Stroke Father    Heart disease Father    Cancer Father    Colon cancer Paternal Grandfather    Breast cancer Maternal Aunt        breast   Breast cancer Maternal Aunt        breast   Pancreatic cancer Maternal Aunt    Hypertension Maternal Aunt        several family members   Breast cancer Maternal Aunt        total of 5 aunts   Breast cancer Maternal Aunt    Colon cancer Maternal Uncle 44       died 21   Throat cancer Maternal Uncle    Diabetes Other        grandmother   Asthma Other        cousin, maternal   Heart attack Neg Hx    Rectal cancer Neg Hx    Stomach cancer Neg Hx     Her Social History Is Significant For: Social History   Socioeconomic History   Marital status: Single     Spouse name: Not on file   Number of children: 0   Years of education: BS   Highest education level: Master's degree (e.g., MA, MS, MEng, MEd, MSW, MBA)  Occupational History   Occupation: Sports coach, going to school    Employer: [employer]   Occupation: Wellsite geologist   Occupation: Chief Executive Officer  Tobacco Use   Smoking status: Never    Passive exposure: Never   Smokeless tobacco: Never  Vaping Use   Vaping status: Never Used  Substance and Sexual Activity   Alcohol use: Not Currently    Comment: socially - occasional    Drug use: No   Sexual activity: Yes    Birth control/protection: Inserts    Comment: nuvaring  Other Topics Concern   Not on file  Social History Narrative   Household:sister and her 3 kids    Drinks occasional starbucks drink       Social Drivers of Corporate investment banker  Strain: Patient Declined (09/29/2022)   Overall Financial Resource Strain (CARDIA)    Difficulty of Paying Living Expenses: Patient declined  Food Insecurity: Food Insecurity Present (09/29/2022)   Hunger Vital Sign    Worried About Running Out of Food in the Last Year: Patient declined    Ran Out of Food in the Last Year: Sometimes true  Transportation Needs: No Transportation Needs (09/29/2022)   PRAPARE - Administrator, Civil Service (Medical): No    Lack of Transportation (Non-Medical): No  Physical Activity: Insufficiently Active (09/29/2022)   Exercise Vital Sign    Days of Exercise per Week: 2 days    Minutes of Exercise per Session: 50 min  Stress: No Stress Concern Present (09/29/2022)   [employer] of Occupational Health - Occupational Stress Questionnaire    Feeling of Stress : Only a little  Social Connections: Moderately Integrated (09/29/2022)   Social Connection and Isolation Panel    Frequency of Communication with Friends and Family: More than three times a week    Frequency of Social Gatherings with Friends and Family: Once a week     Attends Religious Services: 1 to 4 times per year    Active Member of [employer] or Organizations: Yes    Attends Banker Meetings: 1 to 4 times per year    Marital Status: Never married    Her Allergies Are:  Allergies  Allergen Reactions   Acetaminophen -Codeine  Itching   Hydrocodone  Nausea Only   Oxycodone  Itching   Tyloxapol Other (See Comments)    Other reaction(s): Unknown   Codeine  Itching and Nausea Only  :   Her Current Medications Are:  Outpatient Encounter Medications as of 02/07/2024  Medication Sig   Apremilast (OTEZLA) 30 MG TABS Take 1 tablet by mouth 2 (two) times daily.   cetirizine  (ZYRTEC ) 10 MG tablet TAKE 1 TABLET BY MOUTH EVERY DAY   clindamycin  (CLEOCIN  T) 1 % external solution Apply topically 2 (two) times daily.   cyanocobalamin  (VITAMIN B12) 1000 MCG/ML injection Inject 1 mL (1,000 mcg total) into the muscle every 30 (thirty) days.   etonogestrel-ethinyl estradiol  (NUVARING) 0.12-0.015 MG/24HR vaginal ring Place 1 each vaginally every 28 (twenty-eight) days. Insert vaginally and leave in place for 3 consecutive weeks, then remove for 1 week.   Fremanezumab -vfrm (AJOVY ) 225 MG/1.5ML SOAJ Inject 225 mg into the skin every 30 (thirty) days.   Multiple Vitamin (MULTIVITAMIN) LIQD Take 5 mLs by mouth daily.   Phentermine  HCl (LOMAIRA ) 8 MG TABS Take 1 tablet (8 mg total) by mouth 2 (two) times daily.   promethazine  (PHENERGAN ) 12.5 MG tablet Take 1 tablet (12.5 mg total) by mouth every 6 (six) hours as needed for nausea or vomiting.   Rimegepant Sulfate (NURTEC) 75 MG TBDP Take 1 tablet (75 mg total) by mouth as needed. Take 1 tablet at onset of headache, max is 1 tablet in 24 hours.   zolmitriptan  (ZOMIG ) 5 MG tablet TAKE 1 TABLET (5 MG TOTAL) BY MOUTH AS NEEDED FOR MIGRAINE. MAY REPEAT IN 2 HOURS AS NEEDED. NO MORE THAN 2 PILLS/24 HOURS, NO MORE THAN 3 PILLS/WEEK.   No facility-administered encounter medications on file as of 02/07/2024.  :  Review of  Systems:  Out of a complete 14 point review of systems, all are reviewed and negative with the exception of these symptoms as listed below:  Review of Systems  Neurological:        Discuss temporal artery bx.  Objective:  Neurological Exam  Physical Exam Physical Examination:   Vitals:   02/07/24 1006  BP: 128/77  Pulse: 90    General Examination: The patient is a very pleasant 53 y.o. female in no acute distress. She appears well-developed and well-nourished and well groomed.   HEENT: Normocephalic, atraumatic, pupils are equal, round and reactive to light and accommodation.  Mild bilateral cataracts noted.  Mild photophobia and wears dark glasses which she can remove briefly for our eye examination.  Funduscopic exam looks benign.  No obvious temporal area tenderness, extraocular tracking is good without limitation to gaze excursion or nystagmus noted. She has mildly dry appearing eyes. Normal smooth pursuit is noted. Hearing is grossly intact. Face is symmetric with normal facial animation and normal facial sensation. Speech is clear with no dysarthria noted. There is no hypophonia. There is no lip, neck/head, jaw or voice tremor. Neck is supple with full range of passive and active motion. Oropharynx exam reveals: mild mouth dryness, good dental hygiene. Tongue protrudes centrally and palate elevates symmetrically.   Chest: Clear to auscultation without wheezing, rhonchi or crackles noted.   Heart: S1+S2+0, regular and normal without murmurs, rubs or gallops noted.    Abdomen: Soft, non-tender and non-distended with normal bowel sounds appreciated on auscultation.   Extremities: There is no pitting edema in the distal lower extremities bilaterally. Pedal pulses are intact.   Skin: Warm and dry without trophic changes noted. There are no varicose veins.   Musculoskeletal: exam reveals no obvious joint deformities, tenderness or joint swelling or erythema.    Neurologically:   Mental status: The patient is awake, alert and oriented in all 4 spheres. Her immediate and remote memory, attention, language skills and fund of knowledge are appropriate. There is no evidence of aphasia, agnosia, apraxia or anomia. Speech is clear with normal prosody and enunciation. Thought process is linear. Mood is normal and affect is normal.  Cranial nerves II - XII are as described above under HEENT exam.  Motor exam: Normal bulk, strength and tone is noted. There is no drift, tremor or rebound. Reflexes are 1-2+ throughout. Fine motor skills and coordination: intact grossly in the upper and lower extremities bilaterally, no lateralization noted.   Cerebellar testing: No dysmetria or intention tremor. There is no truncal or gait ataxia.   Sensory exam: intact to light touch in the upper and lower extremities.   Gait, station and balance: She stands easily. No veering to one side is noted. No leaning to one side is noted. Posture is age-appropriate and stance is narrow based. Gait shows normal stride length and normal pace. No problems turning are noted.   Assessment and Plan:    In summary, Kyonna A Parkinson is a very pleasant 53 year old right-handed woman with an underlying medical history of allergic rhinitis, endometriosis, TMJ problems, Reflux disease, gastroparesis, hepatic congestion with steatosis, s/p partial hepatectomy for hepatic tumor, lactose intolerance, neck pain, obesity, and migraine headaches (on Botox  injections through our office every 3 months), who presents for a new problem visit of recurrent temporal headaches.  She has a longstanding history of migraines, she does have a history of elevated CRP values in the past, history and examination not telltale for giant cell arteritis.  We talked about the diagnostic possibilities and further workup at length today.  She had a course of prednisone  a couple of months ago which may explain the reduced ESR at this time.  We will do  some inflammatory and autoimmune markers  today.  She may benefit from seeing a rheumatologist at some point.  She has a longstanding history of TMJ problems as well.  Altogether, it may be a multifactorial issue, eye examination with her optometrist recently was benign and reassuring.  We will proceed with an orbital MRI with and without contrast to make sure there is no structural cause, she can be monitored for her eye examination routinely, she does have mild cataracts which we talked about.  She is not keen on getting a temporal artery biopsy and I do not think we need to push for that quite yet.  We can always consider a referral to neuro-ophthalmology next.  We will proceed with lab testing today and a orbital MRI and keep her posted as to the test results.  She will keep her appointment next week for her routine Botox  injections with Lauraine Born, NP as well.  She looks well on examination today with the exception that she does have mild photophobia and feels like she is having a migraine today.  I answered all her questions today and she was in agreement with our plan.  I spent 60 minutes in total face-to-face time and in reviewing records during pre-charting, more than 50% of which was spent in counseling and coordination of care, reviewing test results, reviewing medications and treatment regimen and/or in discussing or reviewing the diagnosis of recurrent temporal headaches, the prognosis and treatment options. Pertinent laboratory and imaging test results that were available during this visit with the patient were reviewed by me and considered in my medical decision making (see chart for details).

## 2024-02-08 ENCOUNTER — Ambulatory Visit: Payer: Self-pay | Admitting: Neurology

## 2024-02-08 ENCOUNTER — Encounter: Payer: Self-pay | Admitting: Neurology

## 2024-02-08 DIAGNOSIS — F40240 Claustrophobia: Secondary | ICD-10-CM

## 2024-02-08 LAB — ENA+DNA/DS+SJORGEN'S
ENA RNP Ab: 4.2 AI — ABNORMAL HIGH (ref 0.0–0.9)
ENA SM Ab Ser-aCnc: <0.2 AI (ref 0.0–0.9)
ENA SSA (RO) Ab: <0.2 AI (ref 0.0–0.9)
ENA SSB (LA) Ab: <0.2 AI (ref 0.0–0.9)
dsDNA Ab: <1 [IU]/mL (ref 0–9)

## 2024-02-08 LAB — TSH: TSH: 1.06 u[IU]/mL (ref 0.450–4.500)

## 2024-02-08 LAB — RHEUMATOID FACTOR: Rheumatoid fact SerPl-aCnc: <10 [IU]/mL (ref <14.0)

## 2024-02-08 LAB — ANA W/REFLEX: Anti Nuclear Antibody (ANA): POSITIVE — AB

## 2024-02-08 LAB — HGB A1C W/O EAG: Hgb A1c MFr Bld: 5.7 % — ABNORMAL HIGH (ref 4.8–5.6)

## 2024-02-10 NOTE — Assessment & Plan Note (Signed)
 Labs done 3 months ago showing A1c of 5.4.  Patient is to repeat A1c later this week.  She voices she has been trying to focus on protein intake getting to goal but has often been over calorie goal.  Will need repeat labs completely done by end of year.

## 2024-02-10 NOTE — Assessment & Plan Note (Signed)
 Patient currently experiencing migraine.  She mentions that these have been no more frequent than they were previously.  Will discuss with patient migraine treatment and plan for further evaluation at next appointment.

## 2024-02-11 MED ORDER — ALPRAZOLAM 0.5 MG PO TABS: ORAL_TABLET | ORAL | 0 refills | Status: DC

## 2024-02-11 NOTE — Telephone Encounter (Signed)
 I have sent an order for Xanax  to her pharmacy.

## 2024-02-11 NOTE — Telephone Encounter (Signed)
Sent to [facility]

## 2024-02-11 NOTE — Telephone Encounter (Signed)
Please see my result note

## 2024-02-12 NOTE — Telephone Encounter (Signed)
 Relayed results of her labs, see other phone message.

## 2024-02-12 NOTE — Telephone Encounter (Signed)
 Relayed results to pt and also pcp.  She verbalized understanding.  Appreciated call back. Pt has MRI later this week.

## 2024-02-12 NOTE — Telephone Encounter (Signed)
-----   Message from True Mar sent at 02/08/2024 11:43 AM EDT ----- Please call patient and advise her that her hemoglobin A1c was in the prediabetes range, she should talk to her PCP about prediabetes management.   She also has a positive marker for autoimmune and rheumatological diseases.  While this is a nonspecific finding, this marker can be positive in certain autoimmune diseases such as inflammatory  arthritis and lupus.  I recommend that she talk to her PCP about further evaluation, such as seeing a rheumatologist next. ----- Message ----- From: Interface, [facility] Lab Results In Sent: 02/08/2024   7:38 AM EDT To: True Mar, MD

## 2024-02-14 ENCOUNTER — Ambulatory Visit (INDEPENDENT_AMBULATORY_CARE_PROVIDER_SITE_OTHER): Admitting: Neurology

## 2024-02-14 ENCOUNTER — Encounter: Payer: Self-pay | Admitting: Neurology

## 2024-02-14 VITALS — BP 138/75 | Ht 63.0 in | Wt 211.0 lb

## 2024-02-14 DIAGNOSIS — G43719 Chronic migraine without aura, intractable, without status migrainosus: Secondary | ICD-10-CM

## 2024-02-14 DIAGNOSIS — R519 Headache, unspecified: Secondary | ICD-10-CM

## 2024-02-14 DIAGNOSIS — G43709 Chronic migraine without aura, not intractable, without status migrainosus: Secondary | ICD-10-CM

## 2024-02-14 MED ORDER — ONABOTULINUMTOXINA 200 UNITS IJ SOLR
155.0000 [IU] | Freq: Once | INTRAMUSCULAR | Status: AC
  Administered 2024-02-14: 155 [IU] via INTRAMUSCULAR

## 2024-02-14 NOTE — Progress Notes (Signed)
   BOTOX  PROCEDURE NOTE FOR MIGRAINE HEADACHE  HISTORY: Morgan Long is here for Botox . Last was 11/15/23 with me. Has seen Dr. Buck for temporal headache, will have MRI orbits. She was able to get Ajovy , did the 1st injection but it malfunctioned.   Description of procedure:  The patient was placed in a sitting position. The standard protocol was used for Botox  as follows, with 5 units of Botox  injected at each site:  -Procerus muscle, midline injection  -Corrugator muscle, bilateral injection  -Frontalis muscle, bilateral injection, with 2 sites each side, medial injection was performed in the upper one third of the frontalis muscle, in the region vertical from the medial inferior edge of the superior orbital rim. The lateral injection was again in the upper one third of the forehead vertically above the lateral limbus of the cornea, 1.5 cm lateral to the medial injection site.  -Temporalis muscle injection, 4 sites, bilaterally. The first injection was 3 cm above the tragus of the ear, second injection site was 1.5 cm to 3 cm up from the first injection site in line with the tragus of the ear. The third injection site was 1.5-3 cm forward between the first 2 injection sites. The fourth injection site was 1.5 cm posterior to the second injection site.  -Occipitalis muscle injection, 3 sites, bilaterally. The first injection was done one half way between the occipital protuberance and the tip of the mastoid process behind the ear. The second injection site was done lateral and superior to the first, 1 fingerbreadth from the first injection. The third injection site was 1 fingerbreadth superiorly and medially from the first injection site.  -Cervical paraspinal muscle injection, 2 sites, bilateral, the first injection site was 1 cm from the midline of the cervical spine, 3 cm inferior to the lower border of the occipital protuberance. The second injection site was 1.5 cm superiorly and  laterally to the first injection site.  -Trapezius muscle injection was performed at 3 sites, bilaterally. The first injection site was in the upper trapezius muscle halfway between the inflection point of the neck, and the acromion. The second injection site was one half way between the acromion and the first injection site. The third injection was done between the first injection site and the inflection point of the neck.  A 200 unit bottle of Botox  was used, 155 units were injected, the rest of the Botox  was wasted. The patient tolerated the procedure well, there were no complications of the above procedure.  Botox  NDC 9976-6078-97 Lot number [id number] Expiration date 05/2025 BB

## 2024-02-14 NOTE — Progress Notes (Signed)
 Botox - 200 units x 1 vial Lot: [id number] Expiration: 05/2025 NDC: 9976-6078-96  Bacteriostatic 0.9% Sodium Chloride - 4 mL  Lot: [id number] Expiration: 04/04/2025 NDC: 9590803397  Dx: H56.280, G43.709   B/B Witnessed by Maurilio JINNY PEAK

## 2024-02-15 ENCOUNTER — Ambulatory Visit
Admission: RE | Admit: 2024-02-15 | Discharge: 2024-02-15 | Disposition: A | Discharge status: Home / Self Care | Source: Ambulatory Visit | Attending: Neurology | Admitting: Neurology

## 2024-02-15 DIAGNOSIS — R700 Elevated erythrocyte sedimentation rate: Secondary | ICD-10-CM

## 2024-02-15 DIAGNOSIS — H538 Other visual disturbances: Secondary | ICD-10-CM | POA: Diagnosis not present

## 2024-02-15 DIAGNOSIS — M26609 Unspecified temporomandibular joint disorder, unspecified side: Secondary | ICD-10-CM

## 2024-02-15 DIAGNOSIS — R519 Headache, unspecified: Secondary | ICD-10-CM | POA: Diagnosis not present

## 2024-02-15 DIAGNOSIS — R7982 Elevated C-reactive protein (CRP): Secondary | ICD-10-CM

## 2024-02-15 MED ORDER — GADOBUTROL 1 MMOL/ML IV SOLN
10.0000 mL | Freq: Once | INTRAVENOUS | Status: AC | PRN
  Administered 2024-02-15: 10 mL via INTRAVENOUS

## 2024-02-22 ENCOUNTER — Other Ambulatory Visit: Payer: Self-pay | Admitting: Family Medicine

## 2024-02-22 DIAGNOSIS — Z0000 Encounter for general adult medical examination without abnormal findings: Secondary | ICD-10-CM

## 2024-02-25 NOTE — Progress Notes (Unsigned)
 SUBJECTIVE: Discussed the use of AI scribe software for clinical note transcription with the patient, who gave verbal consent to proceed.  Chief Complaint: Obesity  Interim History: She is up 1 lb since her last visit.  Restarted with [facility] 11/01/23 - Dr Berkeley  Phentermine  ordered at last visit with Dr. Berkeley, but she has not started, but would like it sent to a different pharmacy.   Morgan Long is here to discuss her progress with her obesity treatment plan. She is on the Category 2 Plan and states she is following her eating plan approximately 75 % of the time. She states she is exercising walking 60 minutes 4 times per week. Morgan Long is a 53 year old female who presents for follow-up of her obesity treatment plan.  She has been managing her weight for four to five years, particularly since her surgery. She has not yet started phentermine  due to pharmacy issues.  Her busy schedule often leads her to skip meals during the day and compensate by eating at night. She works in [employer] and has long workdays, sometimes up to sixteen hours.  She has a history of prediabetes, with a recent increase in A1c levels, back in the prediabetic range.  She also has hyperlipidemia, with her LDL cholesterol not at goal as of three months ago. She experiences migraines and recently received her usual Botox  injections, which have helped reduce the frequency of her headaches.  She was previously advised to undergo a temporal artery biopsy due to concerns about connective tissue disease but sought a second opinion and did not proceed with the procedure for biopsy.   She is awaiting a rheumatology consultation. There is a family history of lupus, with a cousin diagnosed with the condition.  She has a history of vitamin D  deficiency and B12 deficiency. No hx of sleep apnea.   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     I have consulted the  Controlled Substances Registry for this patient, and feel the risk/benefit ratio today is favorable for proceeding with this prescription for a controlled substance. No aberrancies noted.    On Ozempic  in past- not covered by insurance   Phentermine  off and on since 2019   Victoza  caused severe constipation OBJECTIVE: Visit Diagnoses: Problem List Items Addressed This Visit     Generalized obesity   Relevant Medications   Phentermine  HCl (LOMAIRA ) 8 MG TABS   Migraine   Prediabetes   Other hyperlipidemia - Primary   Other Visit Diagnoses       Morbid obesity (HCC)       Relevant Medications   Phentermine  HCl (LOMAIRA ) 8 MG TABS     BMI 36.0-36.9,adult Current BMI 36.7          Obesity Would like to start phentermine  8 mg twice daily, but has not started due to pharmacy issues.  Challenges with eating patterns, including skipping meals during the day and compensating at night.  Potential impact of phentermine  on appetite suppression and headaches discussed.  Alternative weight loss medications like Zepbound  and Wegovy  discussed, but noted high costs and insurance coverage issues. - Send phentermine  prescription to [facility] on [address]. - Start phentermine  8 mg once daily and assess tolerance, especially regarding headaches. -  Consider protein shakes like [employer] Power or Fairlife to maintain protein intake. We discussed the importance of regular meals and adequate protein intake for weight loss and overall health.    Hyperlipidemia Hyperlipidemia with LDL cholesterol not at goal. Potential coverage of Wegovy  for hyperlipidemia discussed, but insurance coverage is uncertain. Consideration of alternative medications like Zepbound , but noted high costs. Continue to work on nutrition plan -decreasing simple carbohydrates, increasing lean proteins,  decreasing saturated fats and cholesterol , avoiding trans fats and exercise as able to promote weight loss, improve lipids and decrease cardiovascular risks.   Prediabetes Prediabetes with a slight increase in A1c. Discussed dietary habits and the importance of consistent meal intake to manage blood sugar levels. Lab Results  Component Value Date   HGBA1C 5.7 (H) 02/07/2024   HGBA1C 5.4 11/01/2023   HGBA1C 5.9 02/12/2023   Lab Results  Component Value Date   LDLCALC 108 (H) 11/01/2023   CREATININE 0.68 11/28/2023  A1c back in prediabetic range and discussed watching/avoiding simple carbohydrate intake and avoiding consistently skipping meals.  Plan:  Continue working on nutrition plan to decrease simple carbohydrates, increase lean proteins and exercise to promote weight loss, improve glycemic control and prevent progression to Type 2 diabetes.    Migraine Migraines managed with Botox  injections. Recent blood work suggests possible connective tissue disease, awaiting rheumatology consultation. Potential impact of phentermine  on headache frequency discussed. Discussed that skipping meals regularly may worsen HA frequency as well.  She recently had an MRI with Dr. 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  This appears unchanged compared to the MRI of the brain from 03/12/2021 and CT 11/28/2023.  The cavernous sinuses appear normal.  This is a nonspecific finding and can be seen with elevated intracranial pressure or Graves' disease.  Carotid-cavernous fistula is unlikely given the normal appearance of the cavernous sinuses and stability over time. The study was otherwise normal. - Monitor headache frequency with phentermine  use and adjust dosage if headaches worsen. - Await rheumatology consultation for further evaluation of possible connective tissue  disease. Vitals Temp: 99.3 F (37.4 C) BP: 119/71 Pulse Rate: 70 SpO2: 99 %   Anthropometric Measurements Height: 5' 3 (1.6 m) Weight: 207 lb (93.9 kg) BMI (Calculated): 36.68 Weight at Last Visit: 206lb Weight Lost Since Last Visit: 0lb Weight Gained Since Last Visit: 1lb Starting Weight: 206lb Total Weight Loss (lbs): 0 lb (0 kg)   Body Composition  Body Fat %: 46.1 % Fat Mass (lbs): 95.6 lbs Muscle Mass (lbs): 106.2 lbs Total Body Water (lbs): 81.6 lbs Visceral Fat Rating : 13   Other Clinical Data Fasting: No Labs: No Today's Visit #: 31 Starting Date: 02/02/21     ASSESSMENT AND PLAN:  Diet: Morgan Long is currently in the action stage of change. As such, her goal is to continue with weight loss efforts. She has agreed to Category 2 Plan.  Exercise: Morgan Long has been instructed to work up to a goal of 150 minutes of combined cardio and strengthening exercise per week and to continue exercising as is for weight loss and overall health benefits.   Behavior Modification:  We discussed the following Behavioral Modification Strategies today: increasing lean protein intake, decreasing simple carbohydrates, increasing vegetables, increase H2O intake, increase high fiber foods, no skipping meals, meal planning and cooking strategies, avoiding temptations, and planning for success. We discussed various medication options to help Morgan Long with  her weight loss efforts and we both agreed to trial of phentermine  8 mg starting with 1/2 tablet and increasing to twice daily as long as she does not have worsening headaches.  Return in about 4 weeks (around 03/25/2024).Morgan Long She was informed of the importance of frequent follow up visits to maximize her success with intensive lifestyle modifications for her multiple health conditions.  Attestation Statements:   Reviewed by clinician on day of visit: allergies, medications, problem list, medical history, surgical history, family history, social  history, and previous encounter notes.   Time spent on visit including pre-visit chart review and post-visit care and charting was 40 minutes.    Trooper Olander, PA-C

## 2024-02-26 ENCOUNTER — Ambulatory Visit (INDEPENDENT_AMBULATORY_CARE_PROVIDER_SITE_OTHER): Admitting: Physician Assistant

## 2024-02-26 ENCOUNTER — Encounter (INDEPENDENT_AMBULATORY_CARE_PROVIDER_SITE_OTHER): Payer: Self-pay | Admitting: Physician Assistant

## 2024-02-26 VITALS — BP 119/71 | HR 70 | Temp 99.3°F | Ht 63.0 in | Wt 207.0 lb

## 2024-02-26 DIAGNOSIS — E7849 Other hyperlipidemia: Secondary | ICD-10-CM

## 2024-02-26 DIAGNOSIS — Z6836 Body mass index (BMI) 36.0-36.9, adult: Secondary | ICD-10-CM

## 2024-02-26 DIAGNOSIS — E669 Obesity, unspecified: Secondary | ICD-10-CM | POA: Diagnosis not present

## 2024-02-26 DIAGNOSIS — R7303 Prediabetes: Secondary | ICD-10-CM

## 2024-02-26 DIAGNOSIS — G43001 Migraine without aura, not intractable, with status migrainosus: Secondary | ICD-10-CM | POA: Diagnosis not present

## 2024-02-26 MED ORDER — LOMAIRA 8 MG PO TABS: 8.0000 mg | ORAL_TABLET | Freq: Two times a day (BID) | ORAL | 0 refills | Status: DC

## 2024-02-29 ENCOUNTER — Other Ambulatory Visit: Payer: Self-pay | Admitting: Neurology

## 2024-03-11 ENCOUNTER — Encounter: Payer: Self-pay | Admitting: Family Medicine

## 2024-03-11 DIAGNOSIS — Z1283 Encounter for screening for malignant neoplasm of skin: Secondary | ICD-10-CM

## 2024-03-14 DIAGNOSIS — L700 Acne vulgaris: Secondary | ICD-10-CM | POA: Diagnosis not present

## 2024-03-31 ENCOUNTER — Ambulatory Visit (INDEPENDENT_AMBULATORY_CARE_PROVIDER_SITE_OTHER): Admitting: Family Medicine

## 2024-03-31 VITALS — BP 117/77 | HR 97 | Temp 98.5°F | Ht 63.0 in | Wt 207.0 lb

## 2024-03-31 DIAGNOSIS — Z9189 Other specified personal risk factors, not elsewhere classified: Secondary | ICD-10-CM | POA: Diagnosis not present

## 2024-03-31 DIAGNOSIS — R7303 Prediabetes: Secondary | ICD-10-CM

## 2024-03-31 DIAGNOSIS — Z6836 Body mass index (BMI) 36.0-36.9, adult: Secondary | ICD-10-CM

## 2024-03-31 DIAGNOSIS — G43001 Migraine without aura, not intractable, with status migrainosus: Secondary | ICD-10-CM

## 2024-03-31 MED ORDER — PHENTERMINE HCL 8 MG PO TABS: 8.0000 mg | ORAL_TABLET | Freq: Two times a day (BID) | ORAL | 0 refills | Status: AC

## 2024-03-31 MED ORDER — PROMETHAZINE HCL 12.5 MG PO TABS: 12.5000 mg | ORAL_TABLET | Freq: Four times a day (QID) | ORAL | 1 refills | Status: AC | PRN

## 2024-03-31 NOTE — Assessment & Plan Note (Signed)
 Patient needs medication assistance for nausea she experiences with migraines.  We discussed possible treatment with zofran  but given possibility of this causing headache will continue phenergan  at this time.

## 2024-03-31 NOTE — Progress Notes (Signed)
 SUBJECTIVE:  Chief Complaint: Obesity  Interim History: Patient gained some weight after our last appointment.  She is being more aware of her food intake and has stopped eating after a certain time of night.  She is stopping her dietary intake around 8:30/9pm.  She is focusing on increasing her protein. She is anticipating getting more intake of protein over the next month.  She is trying to be more strategic with her intake.   Morgan Long is here to discuss her progress with her obesity treatment plan. She is on the Category 2 Plan and states she is following her eating plan approximately 85-86 % of the time. She states she is exercising 45 minutes 5 times per week.   OBJECTIVE: Visit Diagnoses: Problem List Items Addressed This Visit       Cardiovascular and Mediastinum   Migraine - Primary     Other   Prediabetes   Other Visit Diagnoses       At risk for side effect of medication       Relevant Medications   promethazine  (PHENERGAN ) 12.5 MG tablet     Morbid obesity (HCC)       Relevant Medications   Phentermine  HCl (LOMAIRA ) 8 MG TABS       Vitals Temp: 98.5 F (36.9 C) BP: 117/77 Pulse Rate: 97 SpO2: 99 %   Anthropometric Measurements Height: 5' 3 (1.6 m) Weight: 207 lb (93.9 kg) BMI (Calculated): 36.68 Weight at Last Visit: 207 lb Weight Lost Since Last Visit: 0 Weight Gained Since Last Visit: 0 Starting Weight: 206 lb Total Weight Loss (lbs): 0 lb (0 kg)   Body Composition  Body Fat %: 45 % Fat Mass (lbs): 93.2 lbs Muscle Mass (lbs): 108 lbs Total Body Water (lbs): 77.2 lbs Visceral Fat Rating : 13   Other Clinical Data Today's Visit #: 32 Starting Date: 02/02/21 Comments: CaT 2     ASSESSMENT AND PLAN: Assessment & Plan Migraine without aura and with status migrainosus, not intractable Patient needs medication assistance for nausea she experiences with migraines.  We discussed possible treatment with zofran  but given possibility of this  causing headache will continue phenergan  at this time. Prediabetes Most recent A1c of 5.7 which is worsened from 5.4 which it was months ago.  Will continue to work on dietary and activity modifications and will assess impact in the next 3-4 months with repeat labs BMI 36.0-36.9,adult Current BMI 36.7  At risk for side effect of medication Occasional nausea that previously was attributed to use of GLP but not associated with migraines.  Needs phenergan  refill today. Morbid obesity (HCC)    Diet: Morgan Long is currently in the action stage of change. As such, her goal is to continue with weight loss efforts and has agreed to the Category 2 Plan.   Exercise:  For additional and more extensive health benefits, adults should increase their aerobic physical activity to 300 minutes (5 hours) a week of moderate-intensity, or 150 minutes a week of vigorous-intensity aerobic physical activity, or an equivalent combination of moderate- and vigorous-intensity activity. Additional health benefits are gained by engaging in physical activity beyond this amount.  and Adults should also include muscle-strengthening activities that involve all major muscle groups on 2 or more days a week.  Continue current consistency of physical activity.  Behavior Modification:  We discussed the following Behavioral Modification Strategies today: increasing lean protein intake, decreasing simple carbohydrates, increasing vegetables, meal planning and cooking strategies, and holiday eating strategies. We discussed  various medication options to help Morgan Long with her weight loss efforts and we both agreed to continue lomaira  at current dosage- patient has been only doing half a tablet twice a day- will work up to a tablet in the am and if necessary can do half a tablet in the pm.  Return in about 4 weeks (around 04/28/2024) for fasting labs.   She was informed of the importance of frequent follow up visits to maximize her success with  intensive lifestyle modifications for her multiple health conditions.  Attestation Statements:   Reviewed by clinician on day of visit: allergies, medications, problem list, medical history, surgical history, family history, social history, and previous encounter notes.     [name], MD

## 2024-03-31 NOTE — Assessment & Plan Note (Signed)
 Most recent A1c of 5.7 which is worsened from 5.4 which it was months ago.  Will continue to work on dietary and activity modifications and will assess impact in the next 3-4 months with repeat labs

## 2024-04-10 DIAGNOSIS — L821 Other seborrheic keratosis: Secondary | ICD-10-CM | POA: Diagnosis not present

## 2024-04-10 DIAGNOSIS — L700 Acne vulgaris: Secondary | ICD-10-CM | POA: Diagnosis not present

## 2024-04-29 ENCOUNTER — Encounter: Payer: Self-pay | Admitting: Neurology

## 2024-04-30 MED ORDER — ONABOTULINUMTOXINA 200 UNITS IJ SOLR: INTRAMUSCULAR | 2 refills | Status: AC

## 2024-04-30 NOTE — Addendum Note (Signed)
 Addended by: JOSHUA MAURILIO CROME on: 04/30/2024 09:08 AM   Modules accepted: Orders

## 2024-04-30 NOTE — Telephone Encounter (Signed)
 Completed new PA for her to switch to SP and it was approved, please send rx to [facility].  Auth#: [id number] (04/23/24-03/25/25)

## 2024-05-04 ENCOUNTER — Other Ambulatory Visit: Payer: Self-pay | Admitting: Neurology

## 2024-05-05 ENCOUNTER — Ambulatory Visit (INDEPENDENT_AMBULATORY_CARE_PROVIDER_SITE_OTHER): Payer: Self-pay | Admitting: Family Medicine

## 2024-05-09 ENCOUNTER — Encounter (INDEPENDENT_AMBULATORY_CARE_PROVIDER_SITE_OTHER): Payer: Self-pay | Admitting: Family Medicine

## 2024-05-11 ENCOUNTER — Other Ambulatory Visit: Payer: Self-pay | Admitting: Family Medicine

## 2024-05-11 DIAGNOSIS — Z6839 Body mass index (BMI) 39.0-39.9, adult: Secondary | ICD-10-CM

## 2024-05-11 DIAGNOSIS — Z0000 Encounter for general adult medical examination without abnormal findings: Secondary | ICD-10-CM

## 2024-05-19 ENCOUNTER — Encounter: Payer: Self-pay | Admitting: Family Medicine

## 2024-05-19 NOTE — Addendum Note (Signed)
 Addended by: GAYLAND LAURAINE PARAS on: 05/19/2024 09:08 PM   Modules accepted: Orders

## 2024-05-21 ENCOUNTER — Ambulatory Visit: Admitting: Neurology

## 2024-05-21 ENCOUNTER — Encounter: Payer: Self-pay | Admitting: Neurology

## 2024-05-21 ENCOUNTER — Telehealth: Payer: Self-pay | Admitting: Neurology

## 2024-05-21 VITALS — BP 120/74 | HR 79

## 2024-05-21 DIAGNOSIS — G43719 Chronic migraine without aura, intractable, without status migrainosus: Secondary | ICD-10-CM

## 2024-05-21 DIAGNOSIS — G43709 Chronic migraine without aura, not intractable, without status migrainosus: Secondary | ICD-10-CM

## 2024-05-21 DIAGNOSIS — L821 Other seborrheic keratosis: Secondary | ICD-10-CM | POA: Insufficient documentation

## 2024-05-21 MED ORDER — ONABOTULINUMTOXINA 200 UNITS IJ SOLR
155.0000 [IU] | Freq: Once | INTRAMUSCULAR | Status: AC
  Administered 2024-05-21: 10:00:00 155 [IU] via INTRAMUSCULAR

## 2024-05-21 NOTE — Progress Notes (Signed)
° °  BOTOX  PROCEDURE NOTE FOR MIGRAINE HEADACHE  HISTORY: Morgan Long is here for Botox . Last was 02/14/24. Has done great with Botox . Will continue.  Description of procedure:  The patient was placed in a sitting position. The standard protocol was used for Botox  as follows, with 5 units of Botox  injected at each site:   -Procerus muscle, midline injection  -Corrugator muscle, bilateral injection  -Frontalis muscle, bilateral injection, with 2 sites each side, medial injection was performed in the upper one third of the frontalis muscle, in the region vertical from the medial inferior edge of the superior orbital rim. The lateral injection was again in the upper one third of the forehead vertically above the lateral limbus of the cornea, 1.5 cm lateral to the medial injection site.  -Temporalis muscle injection, 4 sites, bilaterally. The first injection was 3 cm above the tragus of the ear, second injection site was 1.5 cm to 3 cm up from the first injection site in line with the tragus of the ear. The third injection site was 1.5-3 cm forward between the first 2 injection sites. The fourth injection site was 1.5 cm posterior to the second injection site.  -Occipitalis muscle injection, 3 sites, bilaterally. The first injection was done one half way between the occipital protuberance and the tip of the mastoid process behind the ear. The second injection site was done lateral and superior to the first, 1 fingerbreadth from the first injection. The third injection site was 1 fingerbreadth superiorly and medially from the first injection site.  -Cervical paraspinal muscle injection, 2 sites, bilateral, the first injection site was 1 cm from the midline of the cervical spine, 3 cm inferior to the lower border of the occipital protuberance. The second injection site was 1.5 cm superiorly and laterally to the first injection site.  -Trapezius muscle injection was performed at 3 sites, bilaterally.  The first injection site was in the upper trapezius muscle halfway between the inflection point of the neck, and the acromion. The second injection site was one half way between the acromion and the first injection site. The third injection was done between the first injection site and the inflection point of the neck.   A 200 unit bottle of Botox  was used, 155 units were injected, the rest of the Botox  was wasted. The patient tolerated the procedure well, there were no complications of the above procedure.  Botox  NDC 9976-6078-97 Lot number [id number] Expiration date 08/2026 SP

## 2024-05-21 NOTE — Telephone Encounter (Signed)
 Referral for neuroophthalmology fax to [facility]. Phone: [phone], Fax: [phone]

## 2024-05-21 NOTE — Progress Notes (Signed)
 Botox - 200 units x 1 vial Lot: [id number] Expiration: 08/2026 NDC: 9976-6078-97  Bacteriostatic 0.9% Sodium Chloride - 4 mL  Lot: [id number] Expiration: 09/02/2025 NDC: 9590-8033-97  Dx: H56.280, G43.709 S/P  Witnessed by [facility] RMA

## 2024-06-07 ENCOUNTER — Other Ambulatory Visit: Payer: Self-pay | Admitting: Neurology

## 2024-06-18 NOTE — Progress Notes (Signed)
 Chief Complaint  Patient presents with   SCRATCHY THROAT    Patient has had a scratchy throat for about 3 days. Has thicker phlegm with a cough, sometimes.   Cerumen Impaction    Feels like there is wax inside of both ears    Morgan Long here for URI complaints.  History of Present Illness Morgan Long is a 54 year old female who presents with upper respiratory symptoms and cough.  For the past 2 to 3 days she has had upper respiratory symptoms with a persistent cough that is worse at night. She notes mild congestion and scratchy throat and ear fullness. Denies sinus pain and pressure. She denies fever. She feels some nausea, which is not new for her. She denies SOB or wheezing.    Patient denies fever, chills, SOB, CP, palpitations, dyspnea, edema, HA, vision changes, N/V/D, abdominal pain, urinary symptoms, rash.  Past Medical History:  Diagnosis Date   Allergic rhinitis    Asthma    B12 deficiency    Chronic migraine    Dr. Malcom   Constipation    Dry eye syndrome of both lacrimal glands    Dry skin    Endometriosis    Fatigue    Gastroparesis    GERD (gastroesophageal reflux disease)    Headache(784.0)    occasional, dx w/ Migraines before, Topamax helps   Heartburn    Hepatic steatosis    History of stomach ulcers    Lactose intolerance    Localized edema    Migraine    Muscle weakness (generalized)    Nuclear cataract of both eyes    Mild   Overweight    Pain in right ankle and joints of right foot    Prediabetes    Shortness of breath    Sinus complaint    Sinusitis    Tired    TMJ pain dysfunction syndrome    occasional   Vitamin D  deficiency     Objective BP 119/84 (BP Location: Left Arm, Patient Position: Sitting, Cuff Size: Large)   Pulse 82   Temp 98.7 F (37.1 C) (Oral)   Resp 16   Ht 5' 3 (1.6 m)   Wt 212 lb 6.4 oz (96.3 kg)   SpO2 99%   BMI 37.62 kg/m  General: Awake, alert, appears stated age HEENT: AT, [place], ears patent b/l  and TM's neg, nares patent w/o discharge, pharynx pink and without exudates, MMM Neck: No masses or asymmetry Heart: RRR Lungs: CTAB, no accessory muscle use Psych: Age appropriate judgment and insight, normal mood and affect  Viral URI with cough - Plan: benzonatate  (TESSALON ) 100 MG capsule, promethazine -dextromethorphan (PROMETHAZINE -DM) 6.25-15 MG/5ML syrup, loratadine  (CLARITIN ) 10 MG tablet  - Rx Tessalon  Perles for cough, Promethazine  DM for cough prn HS - Recommended OTC Claritin  - Advised monitoring symptoms, report if persisting beyond 7-10 days or worsening. Continue to push fluids, practice good hand hygiene, cover mouth when coughing. F/u prn. If starting to experience fevers, shaking, or shortness of breath, seek immediate care. Pt voiced understanding and agreement to the plan.  Harlene LITTIE Jolly, DNP, AGNP-C 06/19/24 10:39 AM

## 2024-06-19 ENCOUNTER — Encounter: Payer: Self-pay | Admitting: Student

## 2024-06-19 ENCOUNTER — Ambulatory Visit (INDEPENDENT_AMBULATORY_CARE_PROVIDER_SITE_OTHER): Admitting: Student

## 2024-06-19 VITALS — BP 119/84 | HR 82 | Temp 98.7°F | Resp 16 | Ht 63.0 in | Wt 212.4 lb

## 2024-06-19 DIAGNOSIS — J069 Acute upper respiratory infection, unspecified: Secondary | ICD-10-CM | POA: Diagnosis not present

## 2024-06-19 MED ORDER — LORATADINE 10 MG PO TABS: 10.0000 mg | ORAL_TABLET | Freq: Every day | ORAL | 0 refills | Status: AC

## 2024-06-19 MED ORDER — BENZONATATE 100 MG PO CAPS: 100.0000 mg | ORAL_CAPSULE | Freq: Two times a day (BID) | ORAL | 0 refills | Status: AC | PRN

## 2024-06-19 MED ORDER — PROMETHAZINE-DM 6.25-15 MG/5ML PO SYRP: 5.0000 mL | ORAL_SOLUTION | Freq: Every evening | ORAL | 0 refills | Status: AC | PRN

## 2024-06-30 ENCOUNTER — Encounter: Payer: Self-pay | Admitting: Family Medicine

## 2024-08-19 ENCOUNTER — Ambulatory Visit: Admitting: Neurology

## 2024-10-14 ENCOUNTER — Ambulatory Visit: Admitting: Dermatology
